# Patient Record
Sex: Male | Born: 1975
Health system: Southern US, Community
[De-identification: ages and names within clinical notes are randomized; demographics above are authoritative.]

## PROBLEM LIST (undated history)

## (undated) DIAGNOSIS — I10 Essential (primary) hypertension: Secondary | ICD-10-CM

## (undated) DIAGNOSIS — R0600 Dyspnea, unspecified: Secondary | ICD-10-CM

## (undated) DIAGNOSIS — J45909 Unspecified asthma, uncomplicated: Secondary | ICD-10-CM

## (undated) DIAGNOSIS — R748 Abnormal levels of other serum enzymes: Secondary | ICD-10-CM

## (undated) DIAGNOSIS — Z87442 Personal history of urinary calculi: Secondary | ICD-10-CM

## (undated) DIAGNOSIS — G4730 Sleep apnea, unspecified: Secondary | ICD-10-CM

## (undated) DIAGNOSIS — J342 Deviated nasal septum: Secondary | ICD-10-CM

## (undated) DIAGNOSIS — I639 Cerebral infarction, unspecified: Secondary | ICD-10-CM

## (undated) HISTORY — PX: VASECTOMY: SHX75

## (undated) SURGERY — ECHOCARDIOGRAM, TRANSESOPHAGEAL
Anesthesia: Moderate Sedation

---

## 2017-01-23 ENCOUNTER — Inpatient Hospital Stay
Admission: RE | Admit: 2017-01-23 | Discharge: 2017-01-23 | Disposition: A | Discharge status: Home / Self Care | Payer: Self-pay | Source: Ambulatory Visit

## 2017-01-24 ENCOUNTER — Inpatient Hospital Stay: Admission: RE | Admit: 2017-01-24 | Payer: Self-pay | Source: Ambulatory Visit

## 2017-02-26 ENCOUNTER — Other Ambulatory Visit: Payer: BLUE CROSS/BLUE SHIELD

## 2017-02-26 ENCOUNTER — Encounter
Admission: RE | Admit: 2017-02-26 | Discharge: 2017-02-26 | Disposition: A | Discharge status: Home / Self Care | Payer: BLUE CROSS/BLUE SHIELD | Source: Ambulatory Visit | Attending: Unknown Physician Specialty | Admitting: Unknown Physician Specialty

## 2017-02-26 HISTORY — DX: Unspecified asthma, uncomplicated: J45.909

## 2017-02-26 HISTORY — DX: Dyspnea, unspecified: R06.00

## 2017-02-26 HISTORY — DX: Sleep apnea, unspecified: G47.30

## 2017-02-26 HISTORY — DX: Essential (primary) hypertension: I10

## 2017-02-26 HISTORY — DX: Deviated nasal septum: J34.2

## 2017-02-26 NOTE — Patient Instructions (Signed)
  Your procedure is scheduled on: 03/06/17 Report to Day Surgery. [facility] To find out your arrival time please call [phone] between 1PM - 3PM on 03/05/17  Remember: Instructions that are not followed completely may result in serious medical risk, up to and including death, or upon the discretion of your surgeon and anesthesiologist your surgery may need to be rescheduled.    _X___ 1. Do not eat food or drink liquids after midnight. No gum chewing or hard candies.     _C___ 2. No Alcohol for 24 hours before or after surgery.   ____ 3. Do Not Smoke For 24 Hours Prior to Your Surgery.   ____ 4. Bring all medications with you on the day of surgery if instructed.    __X__ 5. Notify your doctor if there is any change in your medical condition     (cold, fever, infections).       Do not wear jewelry, make-up, hairpins, clips or nail polish.  Do not wear lotions, powders, or perfumes. You may wear deodorant.  Do not shave 48 hours prior to surgery. Men may shave face and neck.  Do not bring valuables to the hospital.    [facility] or [facility]uables.               Contacts, dentures or bridgework may not be worn into surgery.  Leave your suitcase in the car. After surgery it may be brought to your room.  For patients admitted to the hospital, discharge time is determined by your                treatment team.   Patients discharged the day of surgery will not be allowed to drive home.     ____ Take these medicines the morning of surgery with A SIP OF WATER:    1. NONE  2.   3.   4.  5.  6.  ____ Fleet Enema (as directed)   ____ Use CHG Soap as directed  ____ Use inhalers on the day of surgery  ____ Stop metformin 2 days prior to surgery    ____ Take 1/2 of usual insulin dose the night before surgery and none on the morning of surgery.   ____ Stop Coumadin/Plavix/aspirin on   ____ Stop Anti-inflammatories on    ____  Stop supplements until after surgery.    _X___ Bring C-Pap to the hospital.

## 2017-03-02 ENCOUNTER — Encounter
Admission: RE | Admit: 2017-03-02 | Discharge: 2017-03-02 | Disposition: A | Discharge status: Home / Self Care | Payer: BLUE CROSS/BLUE SHIELD | Source: Ambulatory Visit | Attending: Unknown Physician Specialty | Admitting: Unknown Physician Specialty

## 2017-03-02 ENCOUNTER — Other Ambulatory Visit: Payer: Self-pay

## 2017-03-02 DIAGNOSIS — Z01810 Encounter for preprocedural cardiovascular examination: Secondary | ICD-10-CM | POA: Diagnosis present

## 2017-03-02 DIAGNOSIS — I10 Essential (primary) hypertension: Secondary | ICD-10-CM | POA: Insufficient documentation

## 2017-03-05 MED ORDER — PHENYLEPHRINE HCL 10 % OP SOLN
Freq: Once | OPHTHALMIC | Status: DC
  Filled 2017-03-05: qty 10

## 2017-03-06 ENCOUNTER — Encounter: Payer: Self-pay | Admitting: *Deleted

## 2017-03-06 ENCOUNTER — Ambulatory Visit: Payer: BLUE CROSS/BLUE SHIELD | Admitting: Registered Nurse

## 2017-03-06 ENCOUNTER — Encounter
Admission: RE | Disposition: A | Discharge status: Home / Self Care | Payer: Self-pay | Source: Ambulatory Visit | Attending: Unknown Physician Specialty

## 2017-03-06 ENCOUNTER — Ambulatory Visit
Admission: RE | Admit: 2017-03-06 | Discharge: 2017-03-06 | Disposition: A | Discharge status: Home / Self Care | Payer: BLUE CROSS/BLUE SHIELD | Source: Ambulatory Visit | Attending: Unknown Physician Specialty | Admitting: Unknown Physician Specialty

## 2017-03-06 DIAGNOSIS — I10 Essential (primary) hypertension: Secondary | ICD-10-CM | POA: Diagnosis not present

## 2017-03-06 DIAGNOSIS — J342 Deviated nasal septum: Secondary | ICD-10-CM | POA: Insufficient documentation

## 2017-03-06 DIAGNOSIS — J343 Hypertrophy of nasal turbinates: Secondary | ICD-10-CM | POA: Diagnosis not present

## 2017-03-06 DIAGNOSIS — J45901 Unspecified asthma with (acute) exacerbation: Secondary | ICD-10-CM | POA: Diagnosis not present

## 2017-03-06 DIAGNOSIS — G4730 Sleep apnea, unspecified: Secondary | ICD-10-CM | POA: Insufficient documentation

## 2017-03-06 DIAGNOSIS — J3489 Other specified disorders of nose and nasal sinuses: Secondary | ICD-10-CM | POA: Diagnosis present

## 2017-03-06 HISTORY — PX: NASAL SEPTOPLASTY W/ TURBINOPLASTY: SHX2070

## 2017-03-06 SURGERY — SEPTOPLASTY, NOSE, WITH NASAL TURBINATE REDUCTION
Anesthesia: General | Laterality: Bilateral | Wound class: Clean Contaminated

## 2017-03-06 MED ORDER — FAMOTIDINE 20 MG PO TABS
20.0000 mg | ORAL_TABLET | Freq: Once | ORAL | Status: AC
  Administered 2017-03-06: 20 mg via ORAL

## 2017-03-06 MED ORDER — LACTATED RINGERS IV SOLN
INTRAVENOUS | Status: DC
  Administered 2017-03-06: 08:00:00 via INTRAVENOUS

## 2017-03-06 MED ORDER — SULFAMETHOXAZOLE-TRIMETHOPRIM 800-160 MG PO TABS: 1.0000 | ORAL_TABLET | Freq: Two times a day (BID) | ORAL | 0 refills | Status: DC

## 2017-03-06 MED ORDER — FENTANYL CITRATE (PF) 100 MCG/2ML IJ SOLN
INTRAMUSCULAR | Status: AC
  Filled 2017-03-06: qty 2

## 2017-03-06 MED ORDER — ONDANSETRON HCL 4 MG/2ML IJ SOLN
4.0000 mg | Freq: Once | INTRAMUSCULAR | Status: DC | PRN
Start: 2017-03-06 — End: 2017-03-06

## 2017-03-06 MED ORDER — LIDOCAINE-EPINEPHRINE 1 %-1:100000 IJ SOLN
INTRAMUSCULAR | Status: DC | PRN
  Administered 2017-03-06: 13 mL

## 2017-03-06 MED ORDER — FAMOTIDINE 20 MG PO TABS
ORAL_TABLET | ORAL | Status: AC
  Filled 2017-03-06: qty 1

## 2017-03-06 MED ORDER — BACITRACIN ZINC 500 UNIT/GM EX OINT
TOPICAL_OINTMENT | CUTANEOUS | Status: AC
  Filled 2017-03-06: qty 28.35

## 2017-03-06 MED ORDER — OXYMETAZOLINE HCL 0.05 % NA SOLN
NASAL | Status: AC
  Filled 2017-03-06: qty 15

## 2017-03-06 MED ORDER — LIDOCAINE-EPINEPHRINE 1 %-1:100000 IJ SOLN
INTRAMUSCULAR | Status: AC
  Filled 2017-03-06: qty 1

## 2017-03-06 MED ORDER — ROCURONIUM BROMIDE 50 MG/5ML IV SOLN
INTRAVENOUS | Status: AC
  Filled 2017-03-06: qty 1

## 2017-03-06 MED ORDER — HYDRALAZINE HCL 20 MG/ML IJ SOLN
10.0000 mg | Freq: Once | INTRAMUSCULAR | Status: AC
  Administered 2017-03-06: 10 mg via INTRAVENOUS

## 2017-03-06 MED ORDER — SUGAMMADEX SODIUM 200 MG/2ML IV SOLN
INTRAVENOUS | Status: AC
  Filled 2017-03-06: qty 2

## 2017-03-06 MED ORDER — FENTANYL CITRATE (PF) 100 MCG/2ML IJ SOLN
25.0000 ug | INTRAMUSCULAR | Status: DC | PRN
  Administered 2017-03-06 (×3): 25 ug via INTRAVENOUS

## 2017-03-06 MED ORDER — LIDOCAINE HCL (PF) 4 % IJ SOLN
INTRAMUSCULAR | Status: AC
  Filled 2017-03-06: qty 10

## 2017-03-06 MED ORDER — ROCURONIUM BROMIDE 100 MG/10ML IV SOLN
INTRAVENOUS | Status: DC | PRN
  Administered 2017-03-06: 10 mg via INTRAVENOUS
  Administered 2017-03-06: 5 mg via INTRAVENOUS

## 2017-03-06 MED ORDER — MIDAZOLAM HCL 2 MG/2ML IJ SOLN
INTRAMUSCULAR | Status: DC | PRN
  Administered 2017-03-06: 2 mg via INTRAVENOUS

## 2017-03-06 MED ORDER — ONDANSETRON HCL 4 MG/2ML IJ SOLN
INTRAMUSCULAR | Status: AC
  Filled 2017-03-06: qty 2

## 2017-03-06 MED ORDER — LIDOCAINE HCL (PF) 2 % IJ SOLN
INTRAMUSCULAR | Status: AC
  Filled 2017-03-06: qty 2

## 2017-03-06 MED ORDER — ONDANSETRON HCL 4 MG/2ML IJ SOLN
INTRAMUSCULAR | Status: DC | PRN
  Administered 2017-03-06: 4 mg via INTRAVENOUS

## 2017-03-06 MED ORDER — LABETALOL HCL 5 MG/ML IV SOLN
INTRAVENOUS | Status: AC
  Filled 2017-03-06: qty 4

## 2017-03-06 MED ORDER — DEXAMETHASONE SODIUM PHOSPHATE 10 MG/ML IJ SOLN
INTRAMUSCULAR | Status: DC | PRN
  Administered 2017-03-06: 10 mg via INTRAVENOUS

## 2017-03-06 MED ORDER — MIDAZOLAM HCL 2 MG/2ML IJ SOLN
INTRAMUSCULAR | Status: AC
  Filled 2017-03-06: qty 2

## 2017-03-06 MED ORDER — HYDRALAZINE HCL 20 MG/ML IJ SOLN
INTRAMUSCULAR | Status: AC
  Administered 2017-03-06: 10 mg
  Filled 2017-03-06: qty 1

## 2017-03-06 MED ORDER — DEXAMETHASONE SODIUM PHOSPHATE 10 MG/ML IJ SOLN
INTRAMUSCULAR | Status: AC
  Filled 2017-03-06: qty 1

## 2017-03-06 MED ORDER — PHENYLEPHRINE HCL 10 MG/ML IJ SOLN
INTRAMUSCULAR | Status: AC
  Filled 2017-03-06: qty 1

## 2017-03-06 MED ORDER — BACITRACIN ZINC 500 UNIT/GM EX OINT
TOPICAL_OINTMENT | CUTANEOUS | Status: DC | PRN
  Administered 2017-03-06: 1 via TOPICAL

## 2017-03-06 MED ORDER — LABETALOL HCL 5 MG/ML IV SOLN
10.0000 mg | INTRAVENOUS | Status: DC | PRN
  Administered 2017-03-06: 10 mg via INTRAVENOUS

## 2017-03-06 MED ORDER — PROPOFOL 10 MG/ML IV BOLUS
INTRAVENOUS | Status: DC | PRN
  Administered 2017-03-06: 180 mg via INTRAVENOUS

## 2017-03-06 MED ORDER — PHENYLEPHRINE HCL 10 % OP SOLN
OPHTHALMIC | Status: DC | PRN
  Administered 2017-03-06: 10 mL via NASAL

## 2017-03-06 MED ORDER — OXYMETAZOLINE HCL 0.05 % NA SOLN
6.0000 | Freq: Once | NASAL | Status: AC
  Administered 2017-03-06: 6 via NASAL

## 2017-03-06 MED ORDER — SUCCINYLCHOLINE CHLORIDE 20 MG/ML IJ SOLN
INTRAMUSCULAR | Status: AC
  Filled 2017-03-06: qty 1

## 2017-03-06 MED ORDER — FENTANYL CITRATE (PF) 100 MCG/2ML IJ SOLN
INTRAMUSCULAR | Status: DC | PRN
  Administered 2017-03-06 (×2): 50 ug via INTRAVENOUS

## 2017-03-06 MED ORDER — SUGAMMADEX SODIUM 200 MG/2ML IV SOLN
INTRAVENOUS | Status: DC | PRN
  Administered 2017-03-06: 160 mg via INTRAVENOUS

## 2017-03-06 MED ORDER — OXYCODONE-ACETAMINOPHEN 5-325 MG PO TABS: 1.0000 | ORAL_TABLET | ORAL | 0 refills | Status: DC | PRN

## 2017-03-06 MED ORDER — SUCCINYLCHOLINE CHLORIDE 20 MG/ML IJ SOLN
INTRAMUSCULAR | Status: DC | PRN
Start: 2017-03-06 — End: 2017-03-06
  Administered 2017-03-06: 100 mg via INTRAVENOUS

## 2017-03-06 MED ORDER — LIDOCAINE HCL (CARDIAC) 20 MG/ML IV SOLN
INTRAVENOUS | Status: DC | PRN
  Administered 2017-03-06: 80 mg via INTRAVENOUS

## 2017-03-06 MED ORDER — PROPOFOL 10 MG/ML IV BOLUS
INTRAVENOUS | Status: AC
  Filled 2017-03-06: qty 20

## 2017-03-06 SURGICAL SUPPLY — 25 items
BANDAGE EYE OVAL (MISCELLANEOUS) ×3 IMPLANT
BLADE SURG 15 STRL LF DISP TIS (BLADE) ×1 IMPLANT
BLADE SURG 15 STRL SS (BLADE) ×2
CANISTER SUCT 1200ML W/VALVE (MISCELLANEOUS) ×3 IMPLANT
COAG SUCT 10F 3.5MM HAND CTRL (MISCELLANEOUS) ×3 IMPLANT
DRESSING NASL FOAM PST OP SINU (MISCELLANEOUS) ×2 IMPLANT
DRSG NASAL FOAM POST OP SINU (MISCELLANEOUS) ×6
ELECT REM PT RETURN 9FT ADLT (ELECTROSURGICAL) ×3
ELECTRODE REM PT RTRN 9FT ADLT (ELECTROSURGICAL) ×1 IMPLANT
GLOVE BIO SURGEON STRL SZ7.5 (GLOVE) ×6 IMPLANT
GOWN STRL REUS W/ TWL LRG LVL3 (GOWN DISPOSABLE) ×2 IMPLANT
GOWN STRL REUS W/TWL LRG LVL3 (GOWN DISPOSABLE) ×4
LABEL OR SOLS (LABEL) ×3 IMPLANT
NS IRRIG 500ML POUR BTL (IV SOLUTION) ×3 IMPLANT
PACK HEAD/NECK (MISCELLANEOUS) ×3 IMPLANT
SPLINT NASAL REUTER .5MM (MISCELLANEOUS) ×3 IMPLANT
SPOGE SURGIFLO 8M (HEMOSTASIS) ×2
SPONGE NEURO XRAY DETECT 1X3 (DISPOSABLE) ×3 IMPLANT
SPONGE SURGIFLO 8M (HEMOSTASIS) ×1 IMPLANT
SUT CHROMIC 3-0 (SUTURE) ×2
SUT CHROMIC 3-0 KS 27XMFL CR (SUTURE) ×1
SUT ETHILON 3-0 KS 30 BLK (SUTURE) ×3 IMPLANT
SUT PLAIN GUT 4-0 (SUTURE) ×3 IMPLANT
SUTURE CHRMC 3-0 KS 27XMFL CR (SUTURE) ×1 IMPLANT
WATER STERILE IRR 1000ML POUR (IV SOLUTION) ×3 IMPLANT

## 2017-03-06 NOTE — Op Note (Signed)
PREOPERATIVE DIAGNOSIS:  Chronic nasal obstruction.  POSTOPERATIVE DIAGNOSIS:  Chronic nasal obstruction.  SURGEON:  Davina Poke, M.D.  NAME OF PROCEDURE:  1. Nasal septoplasty. 2. Submucous resection of inferior turbinates.  OPERATIVE FINDINGS:  Severe nasal septal deformity, hypertrophy of the inferior turbinates.   DESCRIPTION OF THE PROCEDURE:  Jeremy [name] was identified in the holding area and taken to the operating room and placed in the supine position.  After general endotracheal anesthesia was induced, the table was turned 45 degrees and the patient was placed in a semi-Fowler position.  The nose was then topically anesthetized with Lidocaine, cotton pledgets were placed within each nostril. After approximately 5 minutes, this was removed at which time a local anesthetic of 1% Lidocaine 1:100,000 units of Epinephrine was used to inject the inferior turbinates in the nasal septum. A total of 69ml ml was used. Examination of the nose showed a severe left nasal septal deformity and tremendous hypertrophied inferior turbinate.  Beginning on the right hand side a hemitransfixion incision was then created on the leading edge of the septum on the right.  A subperichondrial plane was elevated posteriorly on the left and taken back to the perpendicular plate of the ethmoid where subperiosteal plane was elevated posteriorly on the left. A large septal spur was identified on the left hand side impacting on the inferior turbinate.  An inferior rim of cartilage was removed anteriorly with care taken to leave an anterior strut to prevent nasal collapse. With this strut removed the perpendicular plate of the ethmoid was separated from the quadrangular cartilage. The large septal spur was removed.  The septum was then replaced in the midline. Reinspection through each nostril showed excellent reduction of the septal deformity. A left posterior inferior fenestration was then created to allow hematoma  drainage.  With the septoplasty completed, beginning on the left-hand side, a 15 blade was used to incise along the inferior edge of the inferior turbinate. A superior laterally based flap was then elevated. The underlying conchal bone of mucosa was excised using Knight scissors. The flap was then laid back over the turbinate stump and cauterized using suction cautery. In a similar fashion the submucous resection was performed on the right.  With the submucous resection completed bilaterally and no active bleeding, the hemitransfixion incision was then closed using two interrupted 3-0 chromic sutures.  Plastic nasal septal splints were placed within each nostril and affixed to the septum using a 3-0 nylon suture. Stammberger was then used beneath each inferior turbinate for hemostasis.    The patient tolerated the procedure well, was returned to anesthesia, extubated in the operating room, and taken to the recovery room in stable condition.    CULTURES:  None.  SPECIMENS:  None.  ESTIMATED BLOOD LOSS:  25 cc.  Marliyah Reid T  03/06/2017  8:52 AM

## 2017-03-06 NOTE — Progress Notes (Signed)
Dr. Noralyn Pick phoned in reference to blood pressure being Elevated, ordered labetalol.

## 2017-03-06 NOTE — Progress Notes (Signed)
Patient having some coughing, denies being a smoker And states he does have some sinus drainage.  Humidified mask placed over patients face.

## 2017-03-06 NOTE — Progress Notes (Signed)
Phoned Dr. Noralyn Pick to advise bp , give another dose Hydralazine .

## 2017-03-06 NOTE — Progress Notes (Signed)
Phoned Dr. Noralyn Pick in reference to elevated bp still, Hydralazine ordered.

## 2017-03-06 NOTE — H&P (Signed)
The patient's history has been reviewed, patient examined, no change in status, stable for surgery.  Questions were answered to the patients satisfaction.  

## 2017-03-06 NOTE — Anesthesia Postprocedure Evaluation (Signed)
Anesthesia Post Note  Patient: Jeremy [name]  Procedure(s) Performed: Procedure(s) (LRB): NASAL SEPTOPLASTY WITH TURBINATE REDUCTION (Bilateral)  Patient location during evaluation: PACU Anesthesia Type: General Level of consciousness: awake and alert and oriented Pain management: pain level controlled Vital Signs Assessment: post-procedure vital signs reviewed and stable Respiratory status: spontaneous breathing Cardiovascular status: blood pressure returned to baseline Anesthetic complications: no     Last Vitals:  Vitals:   03/06/17 1018 03/06/17 1044  BP: (!) 150/97 (!) 141/92  Pulse: 94 89  Resp: 16 16  Temp: 36.8 C   SpO2: 100% 100%    Last Pain:  Vitals:   03/06/17 1044  TempSrc:   PainSc: 3                  Hutch Rhett

## 2017-03-06 NOTE — Transfer of Care (Signed)
Immediate Anesthesia Transfer of Care Note  Patient: Jeremy [name]  Procedure(s) Performed: Procedure(s): NASAL SEPTOPLASTY WITH TURBINATE REDUCTION (Bilateral)  Patient Location: PACU  Anesthesia Type:General  Level of Consciousness: awake, alert  and oriented  Airway & Oxygen Therapy: Patient Spontanous Breathing and Patient connected to face mask oxygen  Post-op Assessment: Report given to RN and Post -op Vital signs reviewed and stable  Post vital signs: Reviewed and stable  Last Vitals:  Vitals:   03/06/17 0720 03/06/17 0901  BP: (!) 147/101 (!) 152/110  Pulse: 90 92  Resp: 16 10  Temp: 36.5 C (!) 36.2 C  SpO2: 100% 100%    Last Pain:  Vitals:   03/06/17 0901  TempSrc: Temporal         Complications: No apparent anesthesia complications

## 2017-03-06 NOTE — Anesthesia Preprocedure Evaluation (Signed)
Anesthesia Evaluation  Patient identified by MRN, date of birth, ID band Patient awake    Reviewed: Allergy & Precautions, NPO status , Patient's Chart, lab work & pertinent test results  Airway Mallampati: II  TM Distance: <3 FB     Dental   Pulmonary shortness of breath and with exertion, asthma , sleep apnea ,    Pulmonary exam normal        Cardiovascular hypertension, Normal cardiovascular exam     Neuro/Psych    GI/Hepatic negative GI ROS, Neg liver ROS,   Endo/Other  negative endocrine ROS  Renal/GU negative Renal ROS  negative genitourinary   Musculoskeletal negative musculoskeletal ROS (+)   Abdominal Normal abdominal exam  (+)   Peds negative pediatric ROS (+)  Hematology negative hematology ROS (+)   Anesthesia Other Findings   Reproductive/Obstetrics                             Anesthesia Physical Anesthesia Plan  ASA: II  Anesthesia Plan: General   Post-op Pain Management:    Induction: Intravenous  PONV Risk Score and Plan:   Airway Management Planned: Oral ETT  Additional Equipment:   Intra-op Plan:   Post-operative Plan: Extubation in OR  Informed Consent: I have reviewed the patients History and Physical, chart, labs and discussed the procedure including the risks, benefits and alternatives for the proposed anesthesia with the patient or authorized representative who has indicated his/her understanding and acceptance.   Dental advisory given  Plan Discussed with: CRNA and Surgeon  Anesthesia Plan Comments:         Anesthesia Quick Evaluation

## 2017-03-06 NOTE — Discharge Instructions (Signed)

## 2017-03-06 NOTE — Progress Notes (Signed)
Dr. Noralyn Pick aware of improved bp, 147/89.  May proceed With patient onto post op area.

## 2017-03-06 NOTE — Anesthesia Post-op Follow-up Note (Signed)
Anesthesia QCDR form completed.        

## 2017-03-06 NOTE — Anesthesia Procedure Notes (Signed)
Procedure Name: Intubation Date/Time: 03/06/2017 8:07 AM Performed by: Hedda Slade Pre-anesthesia Checklist: Patient identified, Patient being monitored, Timeout performed, Emergency Drugs available and Suction available Patient Re-evaluated:Patient Re-evaluated prior to induction Oxygen Delivery Method: Circle system utilized Preoxygenation: Pre-oxygenation with 100% oxygen Induction Type: IV induction Ventilation: Mask ventilation without difficulty Laryngoscope Size: Mac and 4 Grade View: Grade I Tube type: Oral Rae Tube size: 7.5 mm Number of attempts: 1 Airway Equipment and Method: Stylet Placement Confirmation: ETT inserted through vocal cords under direct vision,  positive ETCO2 and breath sounds checked- equal and bilateral Secured at: 22 cm Tube secured with: Tape Dental Injury: Teeth and Oropharynx as per pre-operative assessment

## 2017-03-06 NOTE — OR Nursing (Signed)
Scant bloody drng from nose; nasal drip pad, gauze applied 1030 am

## 2017-03-07 ENCOUNTER — Encounter: Payer: Self-pay | Admitting: Unknown Physician Specialty

## 2017-07-17 DIAGNOSIS — I639 Cerebral infarction, unspecified: Secondary | ICD-10-CM

## 2017-07-17 HISTORY — DX: Cerebral infarction, unspecified: I63.9

## 2017-07-27 ENCOUNTER — Encounter (HOSPITAL_COMMUNITY)
Admission: EM | Disposition: A | Discharge status: Rehabilitation Facility | Payer: Self-pay | Source: Home / Self Care | Attending: Neurology

## 2017-07-27 ENCOUNTER — Inpatient Hospital Stay (HOSPITAL_COMMUNITY): Payer: BLUE CROSS/BLUE SHIELD | Admitting: Anesthesiology

## 2017-07-27 ENCOUNTER — Emergency Department (HOSPITAL_COMMUNITY): Payer: BLUE CROSS/BLUE SHIELD

## 2017-07-27 ENCOUNTER — Inpatient Hospital Stay (HOSPITAL_COMMUNITY): Payer: BLUE CROSS/BLUE SHIELD

## 2017-07-27 ENCOUNTER — Encounter (HOSPITAL_COMMUNITY): Payer: Self-pay | Admitting: Certified Registered"

## 2017-07-27 ENCOUNTER — Other Ambulatory Visit: Payer: Self-pay

## 2017-07-27 ENCOUNTER — Inpatient Hospital Stay (HOSPITAL_COMMUNITY)
Admission: EM | Admit: 2017-07-27 | Discharge: 2017-08-07 | DRG: 023 | Disposition: A | Discharge status: Rehabilitation Facility | Payer: BLUE CROSS/BLUE SHIELD | Attending: Neurology | Admitting: Neurology

## 2017-07-27 DIAGNOSIS — I609 Nontraumatic subarachnoid hemorrhage, unspecified: Secondary | ICD-10-CM | POA: Diagnosis not present

## 2017-07-27 DIAGNOSIS — D649 Anemia, unspecified: Secondary | ICD-10-CM | POA: Diagnosis not present

## 2017-07-27 DIAGNOSIS — I69351 Hemiplegia and hemiparesis following cerebral infarction affecting right dominant side: Secondary | ICD-10-CM | POA: Diagnosis not present

## 2017-07-27 DIAGNOSIS — J9600 Acute respiratory failure, unspecified whether with hypoxia or hypercapnia: Secondary | ICD-10-CM | POA: Diagnosis not present

## 2017-07-27 DIAGNOSIS — J9690 Respiratory failure, unspecified, unspecified whether with hypoxia or hypercapnia: Secondary | ICD-10-CM

## 2017-07-27 DIAGNOSIS — R414 Neurologic neglect syndrome: Secondary | ICD-10-CM | POA: Diagnosis present

## 2017-07-27 DIAGNOSIS — I63412 Cerebral infarction due to embolism of left middle cerebral artery: Secondary | ICD-10-CM | POA: Diagnosis present

## 2017-07-27 DIAGNOSIS — R29810 Facial weakness: Secondary | ICD-10-CM | POA: Diagnosis present

## 2017-07-27 DIAGNOSIS — G936 Cerebral edema: Secondary | ICD-10-CM

## 2017-07-27 DIAGNOSIS — E876 Hypokalemia: Secondary | ICD-10-CM | POA: Diagnosis not present

## 2017-07-27 DIAGNOSIS — Z888 Allergy status to other drugs, medicaments and biological substances status: Secondary | ICD-10-CM

## 2017-07-27 DIAGNOSIS — I10 Essential (primary) hypertension: Secondary | ICD-10-CM | POA: Diagnosis present

## 2017-07-27 DIAGNOSIS — R29720 NIHSS score 20: Secondary | ICD-10-CM | POA: Diagnosis present

## 2017-07-27 DIAGNOSIS — E878 Other disorders of electrolyte and fluid balance, not elsewhere classified: Secondary | ICD-10-CM | POA: Diagnosis present

## 2017-07-27 DIAGNOSIS — T82538A Leakage of other cardiac and vascular devices and implants, initial encounter: Secondary | ICD-10-CM

## 2017-07-27 DIAGNOSIS — G4733 Obstructive sleep apnea (adult) (pediatric): Secondary | ICD-10-CM | POA: Diagnosis present

## 2017-07-27 DIAGNOSIS — R739 Hyperglycemia, unspecified: Secondary | ICD-10-CM | POA: Diagnosis not present

## 2017-07-27 DIAGNOSIS — F329 Major depressive disorder, single episode, unspecified: Secondary | ICD-10-CM | POA: Diagnosis present

## 2017-07-27 DIAGNOSIS — Z9889 Other specified postprocedural states: Secondary | ICD-10-CM | POA: Diagnosis not present

## 2017-07-27 DIAGNOSIS — R402254 Coma scale, best verbal response, oriented, 24 hours or more after hospital admission: Secondary | ICD-10-CM | POA: Diagnosis not present

## 2017-07-27 DIAGNOSIS — R1310 Dysphagia, unspecified: Secondary | ICD-10-CM | POA: Diagnosis present

## 2017-07-27 DIAGNOSIS — D72829 Elevated white blood cell count, unspecified: Secondary | ICD-10-CM | POA: Diagnosis not present

## 2017-07-27 DIAGNOSIS — E785 Hyperlipidemia, unspecified: Secondary | ICD-10-CM

## 2017-07-27 DIAGNOSIS — N179 Acute kidney failure, unspecified: Secondary | ICD-10-CM | POA: Diagnosis present

## 2017-07-27 DIAGNOSIS — R402144 Coma scale, eyes open, spontaneous, 24 hours or more after hospital admission: Secondary | ICD-10-CM | POA: Diagnosis not present

## 2017-07-27 DIAGNOSIS — F419 Anxiety disorder, unspecified: Secondary | ICD-10-CM | POA: Diagnosis present

## 2017-07-27 DIAGNOSIS — I63512 Cerebral infarction due to unspecified occlusion or stenosis of left middle cerebral artery: Secondary | ICD-10-CM

## 2017-07-27 DIAGNOSIS — R402354 Coma scale, best motor response, localizes pain, 24 hours or more after hospital admission: Secondary | ICD-10-CM | POA: Diagnosis not present

## 2017-07-27 DIAGNOSIS — I639 Cerebral infarction, unspecified: Secondary | ICD-10-CM | POA: Diagnosis not present

## 2017-07-27 DIAGNOSIS — R509 Fever, unspecified: Secondary | ICD-10-CM

## 2017-07-27 DIAGNOSIS — I63032 Cerebral infarction due to thrombosis of left carotid artery: Secondary | ICD-10-CM | POA: Diagnosis not present

## 2017-07-27 DIAGNOSIS — I6350 Cerebral infarction due to unspecified occlusion or stenosis of unspecified cerebral artery: Secondary | ICD-10-CM | POA: Diagnosis not present

## 2017-07-27 DIAGNOSIS — A499 Bacterial infection, unspecified: Secondary | ICD-10-CM | POA: Diagnosis not present

## 2017-07-27 DIAGNOSIS — E781 Pure hyperglyceridemia: Secondary | ICD-10-CM | POA: Diagnosis not present

## 2017-07-27 DIAGNOSIS — G441 Vascular headache, not elsewhere classified: Secondary | ICD-10-CM | POA: Diagnosis not present

## 2017-07-27 DIAGNOSIS — D62 Acute posthemorrhagic anemia: Secondary | ICD-10-CM | POA: Diagnosis not present

## 2017-07-27 DIAGNOSIS — N390 Urinary tract infection, site not specified: Secondary | ICD-10-CM | POA: Diagnosis not present

## 2017-07-27 DIAGNOSIS — J45909 Unspecified asthma, uncomplicated: Secondary | ICD-10-CM | POA: Diagnosis present

## 2017-07-27 DIAGNOSIS — Z9911 Dependence on respirator [ventilator] status: Secondary | ICD-10-CM

## 2017-07-27 DIAGNOSIS — R4701 Aphasia: Secondary | ICD-10-CM | POA: Diagnosis present

## 2017-07-27 DIAGNOSIS — I69919 Unspecified symptoms and signs involving cognitive functions following unspecified cerebrovascular disease: Secondary | ICD-10-CM | POA: Diagnosis not present

## 2017-07-27 DIAGNOSIS — E782 Mixed hyperlipidemia: Secondary | ICD-10-CM | POA: Diagnosis not present

## 2017-07-27 DIAGNOSIS — E538 Deficiency of other specified B group vitamins: Secondary | ICD-10-CM | POA: Diagnosis not present

## 2017-07-27 DIAGNOSIS — G932 Benign intracranial hypertension: Secondary | ICD-10-CM | POA: Diagnosis present

## 2017-07-27 DIAGNOSIS — J9601 Acute respiratory failure with hypoxia: Secondary | ICD-10-CM | POA: Diagnosis not present

## 2017-07-27 DIAGNOSIS — Z978 Presence of other specified devices: Secondary | ICD-10-CM

## 2017-07-27 DIAGNOSIS — F4323 Adjustment disorder with mixed anxiety and depressed mood: Secondary | ICD-10-CM | POA: Diagnosis not present

## 2017-07-27 DIAGNOSIS — Z79899 Other long term (current) drug therapy: Secondary | ICD-10-CM

## 2017-07-27 DIAGNOSIS — I69314 Frontal lobe and executive function deficit following cerebral infarction: Secondary | ICD-10-CM | POA: Diagnosis not present

## 2017-07-27 DIAGNOSIS — I69320 Aphasia following cerebral infarction: Secondary | ICD-10-CM | POA: Diagnosis not present

## 2017-07-27 HISTORY — PX: RADIOLOGY WITH ANESTHESIA: SHX6223

## 2017-07-27 LAB — CBC
HEMATOCRIT: 45.5 % (ref 39.0–52.0)
HEMOGLOBIN: 16.1 g/dL (ref 13.0–17.0)
MCH: 31.1 pg (ref 26.0–34.0)
MCHC: 35.4 g/dL (ref 30.0–36.0)
MCV: 88 fL (ref 78.0–100.0)
Platelets: 228 10*3/uL (ref 150–400)
RBC: 5.17 MIL/uL (ref 4.22–5.81)
RDW: 12.3 % (ref 11.5–15.5)
WBC: 6.6 10*3/uL (ref 4.0–10.5)

## 2017-07-27 LAB — I-STAT TROPONIN, ED: TROPONIN I, POC: 0 ng/mL (ref 0.00–0.08)

## 2017-07-27 LAB — COMPREHENSIVE METABOLIC PANEL
ALT: 98 U/L — ABNORMAL HIGH (ref 17–63)
ANION GAP: 11 (ref 5–15)
AST: 48 U/L — ABNORMAL HIGH (ref 15–41)
Albumin: 4.2 g/dL (ref 3.5–5.0)
Alkaline Phosphatase: 40 U/L (ref 38–126)
BILIRUBIN TOTAL: 1.1 mg/dL (ref 0.3–1.2)
BUN: 22 mg/dL — AB (ref 6–20)
CHLORIDE: 99 mmol/L — AB (ref 101–111)
CO2: 26 mmol/L (ref 22–32)
Calcium: 9.2 mg/dL (ref 8.9–10.3)
Creatinine, Ser: 1.29 mg/dL — ABNORMAL HIGH (ref 0.61–1.24)
GFR calc non Af Amer: >60 mL/min (ref >60)
Glucose, Bld: 129 mg/dL — ABNORMAL HIGH (ref 65–99)
Potassium: 4.1 mmol/L (ref 3.5–5.1)
Sodium: 136 mmol/L (ref 135–145)
TOTAL PROTEIN: 7.8 g/dL (ref 6.5–8.1)

## 2017-07-27 LAB — DIFFERENTIAL
BASOS ABS: 0 10*3/uL (ref 0.0–0.1)
BASOS PCT: 0 %
EOS PCT: 1 %
Eosinophils Absolute: 0.1 10*3/uL (ref 0.0–0.7)
LYMPHS ABS: 2.7 10*3/uL (ref 0.7–4.0)
Lymphocytes Relative: 41 %
MONO ABS: 0.6 10*3/uL (ref 0.1–1.0)
MONOS PCT: 9 %
Neutro Abs: 3.2 10*3/uL (ref 1.7–7.7)
Neutrophils Relative %: 49 %

## 2017-07-27 LAB — I-STAT CHEM 8, ED
BUN: 25 mg/dL — ABNORMAL HIGH (ref 6–20)
CREATININE: 1.3 mg/dL — AB (ref 0.61–1.24)
Calcium, Ion: 1.09 mmol/L — ABNORMAL LOW (ref 1.15–1.40)
Chloride: 99 mmol/L — ABNORMAL LOW (ref 101–111)
Glucose, Bld: 125 mg/dL — ABNORMAL HIGH (ref 65–99)
HCT: 48 % (ref 39.0–52.0)
HEMOGLOBIN: 16.3 g/dL (ref 13.0–17.0)
Potassium: 4.1 mmol/L (ref 3.5–5.1)
Sodium: 138 mmol/L (ref 135–145)
TCO2: 25 mmol/L (ref 22–32)

## 2017-07-27 LAB — PROTIME-INR
INR: 0.98
Prothrombin Time: 12.9 seconds (ref 11.4–15.2)

## 2017-07-27 LAB — APTT: aPTT: 25 seconds (ref 24–36)

## 2017-07-27 SURGERY — RADIOLOGY WITH ANESTHESIA
Anesthesia: General

## 2017-07-27 MED ORDER — SODIUM CHLORIDE 0.9 % IV SOLN
INTRAVENOUS | Status: DC | PRN
  Administered 2017-07-27: 22:00:00 via INTRAVENOUS

## 2017-07-27 MED ORDER — ALTEPLASE (STROKE) FULL DOSE INFUSION
0.9000 mg/kg | Freq: Once | INTRAVENOUS | Status: AC
  Administered 2017-07-27: 81 mg via INTRAVENOUS
  Filled 2017-07-27: qty 100

## 2017-07-27 MED ORDER — LABETALOL HCL 5 MG/ML IV SOLN
INTRAVENOUS | Status: AC
  Filled 2017-07-27: qty 4

## 2017-07-27 MED ORDER — FENTANYL CITRATE (PF) 100 MCG/2ML IJ SOLN
INTRAMUSCULAR | Status: AC
  Filled 2017-07-27: qty 2

## 2017-07-27 MED ORDER — PHENYLEPHRINE HCL 10 MG/ML IJ SOLN
INTRAVENOUS | Status: DC | PRN
  Administered 2017-07-27: 20 ug/min via INTRAVENOUS

## 2017-07-27 MED ORDER — SENNOSIDES-DOCUSATE SODIUM 8.6-50 MG PO TABS: 1.0000 | ORAL_TABLET | Freq: Every evening | ORAL | Status: DC | PRN

## 2017-07-27 MED ORDER — NITROGLYCERIN 1 MG/10 ML FOR IR/CATH LAB
INTRA_ARTERIAL | Status: AC
  Filled 2017-07-27: qty 10

## 2017-07-27 MED ORDER — NICARDIPINE HCL IN NACL 20-0.86 MG/200ML-% IV SOLN
0.0000 mg/h | INTRAVENOUS | Status: DC
Start: 2017-07-27 — End: 2017-07-28
  Administered 2017-07-28: 5 mg/h via INTRAVENOUS
  Filled 2017-07-27: qty 200

## 2017-07-27 MED ORDER — SUCCINYLCHOLINE CHLORIDE 20 MG/ML IJ SOLN
INTRAMUSCULAR | Status: DC | PRN
  Administered 2017-07-27: 100 mg via INTRAVENOUS

## 2017-07-27 MED ORDER — PHENYLEPHRINE HCL 10 MG/ML IJ SOLN
INTRAMUSCULAR | Status: DC | PRN
  Administered 2017-07-27 (×3): 40 ug via INTRAVENOUS

## 2017-07-27 MED ORDER — IOPAMIDOL (ISOVUE-300) INJECTION 61%
INTRAVENOUS | Status: AC
  Administered 2017-07-28: 40 mL
  Filled 2017-07-27: qty 150

## 2017-07-27 MED ORDER — PROPOFOL 10 MG/ML IV BOLUS
INTRAVENOUS | Status: DC | PRN
  Administered 2017-07-27: 200 mg via INTRAVENOUS

## 2017-07-27 MED ORDER — CEFAZOLIN SODIUM-DEXTROSE 2-3 GM-%(50ML) IV SOLR
INTRAVENOUS | Status: DC | PRN
  Administered 2017-07-27: 2 g via INTRAVENOUS

## 2017-07-27 MED ORDER — SODIUM CHLORIDE 0.9 % IV SOLN: Freq: Once | INTRAVENOUS | Status: DC

## 2017-07-27 MED ORDER — FENTANYL CITRATE (PF) 100 MCG/2ML IJ SOLN
INTRAMUSCULAR | Status: DC | PRN
  Administered 2017-07-27 – 2017-07-28 (×2): 100 ug via INTRAVENOUS

## 2017-07-27 MED ORDER — ACETAMINOPHEN 650 MG RE SUPP: 650.0000 mg | RECTAL | Status: DC | PRN

## 2017-07-27 MED ORDER — ACETAMINOPHEN 325 MG PO TABS: 650.0000 mg | ORAL_TABLET | ORAL | Status: DC | PRN

## 2017-07-27 MED ORDER — ACETAMINOPHEN 160 MG/5ML PO SOLN
650.0000 mg | ORAL | Status: DC | PRN
  Administered 2017-07-30: 650 mg
  Filled 2017-07-27: qty 20.3

## 2017-07-27 MED ORDER — EPTIFIBATIDE 20 MG/10ML IV SOLN
INTRAVENOUS | Status: AC
  Filled 2017-07-27: qty 10

## 2017-07-27 MED ORDER — LABETALOL HCL 5 MG/ML IV SOLN
20.0000 mg | Freq: Once | INTRAVENOUS | Status: AC
  Administered 2017-07-28: 20 mg via INTRAVENOUS

## 2017-07-27 MED ORDER — IOPAMIDOL (ISOVUE-370) INJECTION 76%
INTRAVENOUS | Status: AC
  Administered 2017-07-27: 21:00:00
  Filled 2017-07-27: qty 100

## 2017-07-27 MED ORDER — CEFAZOLIN SODIUM-DEXTROSE 2-4 GM/100ML-% IV SOLN
INTRAVENOUS | Status: AC
  Filled 2017-07-27: qty 100

## 2017-07-27 MED ORDER — SODIUM CHLORIDE 0.9 % IV SOLN
INTRAVENOUS | Status: DC
  Administered 2017-07-28: 01:00:00 via INTRAVENOUS
  Administered 2017-07-28: 75 mL/h via INTRAVENOUS

## 2017-07-27 MED ORDER — STROKE: EARLY STAGES OF RECOVERY BOOK
Freq: Once | Status: AC
  Administered 2017-07-28: 01:00:00
  Filled 2017-07-27: qty 1

## 2017-07-27 MED ORDER — ROCURONIUM BROMIDE 100 MG/10ML IV SOLN
INTRAVENOUS | Status: DC | PRN
  Administered 2017-07-27: 40 mg via INTRAVENOUS
  Administered 2017-07-27: 50 mg via INTRAVENOUS

## 2017-07-27 NOTE — Anesthesia Preprocedure Evaluation (Addendum)
Anesthesia Evaluation  Patient identified by MRN, date of birth, ID band Patient confused    Reviewed: Allergy & Precautions, H&P , NPO status , Patient's Chart, lab work & pertinent test results  Airway Mallampati: II  TM Distance: >3 FB Neck ROM: Full    Dental no notable dental hx. (+) Teeth Intact, Dental Advisory Given   Pulmonary asthma , sleep apnea ,    Pulmonary exam normal breath sounds clear to auscultation       Cardiovascular hypertension, Pt. on medications  Rhythm:Regular Rate:Normal     Neuro/Psych Anxiety Depression CVA, Residual Symptoms    GI/Hepatic negative GI ROS, Neg liver ROS,   Endo/Other  negative endocrine ROS  Renal/GU negative Renal ROS  negative genitourinary   Musculoskeletal   Abdominal   Peds  Hematology negative hematology ROS (+)   Anesthesia Other Findings   Reproductive/Obstetrics negative OB ROS                            Anesthesia Physical Anesthesia Plan  ASA: III and emergent  Anesthesia Plan: General   Post-op Pain Management:    Induction: Intravenous, Rapid sequence and Cricoid pressure planned  PONV Risk Score and Plan: 2 and Ondansetron and Dexamethasone  Airway Management Planned: Oral ETT  Additional Equipment: Arterial line  Intra-op Plan:   Post-operative Plan: Post-operative intubation/ventilation  Informed Consent: I have reviewed the patients History and Physical, chart, labs and discussed the procedure including the risks, benefits and alternatives for the proposed anesthesia with the patient or authorized representative who has indicated his/her understanding and acceptance.   Dental advisory given  Plan Discussed with: CRNA  Anesthesia Plan Comments:         Anesthesia Quick Evaluation

## 2017-07-27 NOTE — Anesthesia Procedure Notes (Signed)
Procedure Name: Intubation Date/Time: 07/27/2017 10:01 PM Performed by: Babs Bertin, CRNA Pre-anesthesia Checklist: Patient identified, Emergency Drugs available, Suction available and Patient being monitored Patient Re-evaluated:Patient Re-evaluated prior to induction Oxygen Delivery Method: Circle System Utilized Preoxygenation: Pre-oxygenation with 100% oxygen Induction Type: IV induction, Rapid sequence and Cricoid Pressure applied Laryngoscope Size: Mac and 3 Grade View: Grade I Tube type: Subglottic suction tube Tube size: 7.5 mm Number of attempts: 1 Airway Equipment and Method: Stylet and Oral airway Placement Confirmation: ETT inserted through vocal cords under direct vision,  positive ETCO2 and breath sounds checked- equal and bilateral Secured at: 22 cm Tube secured with: Tape Dental Injury: Teeth and Oropharynx as per pre-operative assessment

## 2017-07-27 NOTE — Progress Notes (Signed)
Pharmacist Code Stroke Response  Notified to mix tPA at 2103 by Dr. Wilford CornerArora Delivered tPA to RN at 2106  Issues/delays encountered (if applicable): None  Katherine MantleHeysel Lam, PharmD Candidate   Babs BertinHaley Ura Hausen, PharmD, BCPS Clinical Pharmacist 07/27/2017 9:11 PM

## 2017-07-27 NOTE — ED Provider Notes (Signed)
[facility] EMERGENCY DEPARTMENT Provider Note   CSN: [id number] Arrival date & time: 07/27/17  2056     History   Chief Complaint No chief complaint on file.   HPI Jeremy [name] is a 42 y.o. male.  The history is provided by the EMS personnel.  Cerebrovascular Accident  This is a new problem. The current episode started less than 1 hour ago. The problem occurs constantly. The problem has not changed since onset.Associated symptoms comments: Unable to perform ROS. Nothing aggravates the symptoms. Nothing relieves the symptoms. He has tried nothing for the symptoms.    Past Medical History:  Diagnosis Date  . Anxiety   . Asthma    MILD AS A CHILD  . Depression   . Deviated septum   . Dyspnea    NORMAL SPIROMETRY 02/01/17 VISIT WITH DR HEDRICK  . Hypertension   . Sleep apnea     There are no active problems to display for this patient.   Past Surgical History:  Procedure Laterality Date  . NASAL SEPTOPLASTY W/ TURBINOPLASTY Bilateral 03/06/2017   Procedure: NASAL SEPTOPLASTY WITH TURBINATE REDUCTION;  Surgeon: Linus Salmons, MD;  Location: [facility] ORS;  Service: ENT;  Laterality: Bilateral;       Home Medications    Prior to Admission medications   Medication Sig Start Date End Date Taking? Authorizing Provider  amLODipine (NORVASC) 10 MG tablet Take 10 mg by mouth at bedtime. 10/20/16   [provider]  KLOR-CON M20 20 MEQ tablet Take 40 mEq by mouth daily. 01/01/17   [provider]  oxyCODONE-acetaminophen (ROXICET) 5-325 MG tablet Take 1-2 tablets by mouth every 4 (four) hours as needed for severe pain. 03/06/17   Linus Salmons, MD  sulfamethoxazole-trimethoprim (BACTRIM DS,SEPTRA DS) 800-160 MG tablet Take 1 tablet by mouth 2 (two) times daily. 03/06/17   Linus Salmons, MD  triamterene-hydrochlorothiazide (MAXZIDE-25) 37.5-25 MG tablet Take 1 tablet by mouth daily. 01/04/17   [provider]    Family History No  family history on file.  Social History Social History   Tobacco Use  . Smoking status: Never Smoker  . Smokeless tobacco: Never Used  Substance Use Topics  . Alcohol use: Yes    Comment: RARE  . Drug use: No     Allergies   Patient has no known allergies.   Review of Systems Review of Systems  Unable to perform ROS: Acuity of condition     Physical Exam Updated Vital Signs Ht 5\' 10"  (1.778 m)   Wt 90.1 kg (198 lb 9.6 oz)   BMI 28.50 kg/m   Physical Exam  Constitutional: He appears well-developed and well-nourished.  HENT:  Head: Normocephalic and atraumatic.  Eyes: Conjunctivae are normal.  Neck: Neck supple.  Cardiovascular: Normal rate and regular rhythm.  No murmur heard. Pulmonary/Chest: Effort normal. No respiratory distress.  Abdominal: Soft. There is no tenderness.  Musculoskeletal: He exhibits no edema.  Neurological: He is alert.  Head tilted to the left w/left gaze preference, right facial droop, right hemiparesis, decreased sensation on the right; able to answer questions appropriately  Skin: Skin is warm and dry.  Psychiatric: He has a normal mood and affect.  Nursing note and vitals reviewed.    ED Treatments / Results  Labs (all labs ordered are listed, but only abnormal results are displayed) Labs Reviewed  I-STAT CHEM 8, ED - Abnormal; Notable for the following components:      Result Value   Chloride 99 (*)  BUN 25 (*)    Creatinine, Ser 1.30 (*)    Glucose, Bld 125 (*)    Calcium, Ion 1.09 (*)    All other components within normal limits  CBC  DIFFERENTIAL  PROTIME-INR  APTT  COMPREHENSIVE METABOLIC PANEL  I-STAT TROPONIN, ED  CBG MONITORING, ED    EKG  EKG Interpretation None       Radiology No results found.  Procedures Procedures (including critical care time)  Medications Ordered in ED Medications  iopamidol (ISOVUE-370) 76 % injection (not administered)     Initial Impression / Assessment and Plan /  ED Course  I have reviewed the triage vital signs and the nursing notes.  Pertinent labs & imaging results that were available during my care of the patient were reviewed by me and considered in my medical decision making (see chart for details).     Pt presents as a CODE STROKE. LKN 2000. EMS reports that the Pt had been complaining of a HA for the last 3days and thought he was coming down with the flu. This evening, the Pt's wife heard a noise and found that her husband had fallen out of bed; when she assessed him, he had neurological deficits, so she called 911.  VS & exam as above. Airway patency confirmed on arrival.  Neurology accompanied the Pt to CT, reviewed his images, and started tPA for a left MCA occlusion. Pt taken directly from CT to the IR suite for thrombectomy & will be admitted to the ICU upon completion of his procedure.  Final Clinical Impressions(s) / ED Diagnoses   Final diagnoses:  Cerebrovascular accident (CVA) due to occlusion of left middle cerebral artery [facility](HCC)    ED Discharge Orders    None       Forest BeckerPetit, Alexsis Branscom, MD 07/27/17 2130    Loren RacerYelverton, David, MD 07/27/17 2223

## 2017-07-27 NOTE — H&P (View-Only) (Signed)
STROKE H&P  CC: Code stroke  History is obtained from: Patient, chart, family  HPI: Demorio Seeley is a 42 y.o. male past history of hypertension, on antihypertensives, who was in usual state of health last seen normal at 8 PM today by his wife, was noted to fall off of bed around 8:10 PM and found to have left gaze preference, right facial droop, slurred speech and right-sided weakness.  EMS arrived on site, blood pressure was in the normal range.  CBG was in the normal range.  He had dense right hemiplegia, right-sided neglect, left gaze preference, right facial droop and initially incoherent speech but then only slurred speech. He was brought into the emergency room at [facility] and evaluated on the bridge.  On arrival, initial NIH stroke scale was 20.  Noncontrast CT of the head was negative for bleed.  It did show a dense left MCA and and aspects of 8.  IV TPA was initiated at 9:11 PM.  CT angiogram and perfusion study were done.  Confirmed the left MCA occlusion.  Endovascular team was consulted even prior to obtaining the CT angiogram and the patient was taken in for endovascular thrombectomy.  He is currently in the neuro interventional radiology suite.   LKW: 8 PM on 07/27/2017 tpa given?: yes Premorbid modified Rankin scale (mRS): 0  ROS: Only pertinent for headache that he had been having for the past couple of days.  Rest of the systems reviewed and the review of systems was negative.  Past Medical History:  Diagnosis Date  . Anxiety   . Asthma    MILD AS A CHILD  . Depression   . Deviated septum   . Dyspnea    NORMAL SPIROMETRY 02/01/17 VISIT WITH DR HEDRICK  . Hypertension   . Sleep apnea    No family history on file. No family history of strokes in young, no family history of heart attacks in young.  Grandfather had a heart attack.  Social History:   reports that  has never smoked. he has never used smokeless tobacco. He reports that he drinks alcohol. He reports that he does  not use drugs.  Denies tobacco drug or alcohol use.  Medications  Current Facility-Administered Medications:  .  alteplase (ACTIVASE) 1 mg/mL infusion 81 mg, 0.9 mg/kg, Intravenous, Once, Milon Dikes, MD  Current Outpatient Medications:  .  amLODipine (NORVASC) 10 MG tablet, Take 10 mg by mouth at bedtime., Disp: , Rfl: 3 .  KLOR-CON M20 20 MEQ tablet, Take 40 mEq by mouth daily., Disp: , Rfl: 11 .  oxyCODONE-acetaminophen (ROXICET) 5-325 MG tablet, Take 1-2 tablets by mouth every 4 (four) hours as needed for severe pain., Disp: 40 tablet, Rfl: 0 .  sulfamethoxazole-trimethoprim (BACTRIM DS,SEPTRA DS) 800-160 MG tablet, Take 1 tablet by mouth 2 (two) times daily., Disp: 20 tablet, Rfl: 0 .  triamterene-hydrochlorothiazide (MAXZIDE-25) 37.5-25 MG tablet, Take 1 tablet by mouth daily., Disp: , Rfl: 11  Exam: Current vital signs: Ht 5\' 10"  (1.778 m)   Wt 90.1 kg (198 lb 9.6 oz)   BMI 28.50 kg/m  Vital signs in last 24 hours: Weight:  [90.1 kg (198 lb 9.6 oz)] 90.1 kg (198 lb 9.6 oz) (01/11 2000) General: Awake alert in no apparent distress HEENT: Normocephalic, atraumatic, dry mucous membranes Lungs clear to auscultation Abdomen: Nondistended nontender Extremities warm and well-perfused Neurological exam Mental status: Alert awake oriented x3 Speech is severely dysarthric Naming comprehension repetition are intact Cranial nerve exam: Pupils equal  round reactive to light, forced gaze deviation to the left, visual field examination showed right homonymous hemianopsia, right facial weakness upper and lower face. Motor exam: Flaccid right upper and lower extremity.  Full strength left upper and left lower extremity Sensory exam: Dense sensory loss on the right hemibody.  Intact sensation on the left Coordination: Intact finger nose finger on the left.  Unable to perform on the right Gait testing was deferred NIH-20  Labs I have reviewed labs in epic and the results pertinent to  this consultation are: CBC    Component Value Date/Time   WBC 6.6 07/27/2017 2055   RBC 5.17 07/27/2017 2055   HGB 16.3 07/27/2017 2103   HCT 48.0 07/27/2017 2103   PLT 228 07/27/2017 2055   MCV 88.0 07/27/2017 2055   MCH 31.1 07/27/2017 2055   MCHC 35.4 07/27/2017 2055   RDW 12.3 07/27/2017 2055   LYMPHSABS 2.7 07/27/2017 2055   MONOABS 0.6 07/27/2017 2055   EOSABS 0.1 07/27/2017 2055   BASOSABS 0.0 07/27/2017 2055   CMP     Component Value Date/Time   NA 138 07/27/2017 2103   K 4.1 07/27/2017 2103   CL 99 (L) 07/27/2017 2103   GLUCOSE 125 (H) 07/27/2017 2103   BUN 25 (H) 07/27/2017 2103   CREATININE 1.30 (H) 07/27/2017 2103   Imaging I have reviewed the images obtained: CT-scan of the brain - LMCA stroke, dense LMCA, ASPECTS 8, no bleed CTA head/neck: left M1 occlusion, CTP shows large core >70cc, mismatch ratio 1.9.  Assessment:  [age] year old with a past history of hypertension with sudden onset of right-sided hemiplegia, right facial droop, slurred speech and forced left gaze deviation consistent with a left MCA syndrome. Noncontrast CT negative for bleed.  IV TPA administered.  Endovascular team paged and and route to take patient for the endovascular thrombectomy intervention.  CT angios perfusion shows large cord but this is in the hyper acute stage and the rapid software perfusion mismatch is not reliable at this time.  With an aspect score of 8, he has a very good candidate for endovascular thrombectomy. He will be admitted for post TPA and thrombectomy care and stroke workup.  Impression: Acute ischemic stroke Cerebral infarction due to embolism of the left middle cerebral artery Acuity: Acute Current suspected etiology: Under investigation Continue evaluation Admit to neuro ICU Hold aspirin/subcu heparin/Lovenox for at least 24 hours after TPA to limiting is stable without any evidence of bleed. Blood pressure control-goal systolic less than 180 status post  TPA.  If successful mechanical thrombectomy is obtained, then the systolic goal would be less than 140. MRI brain without contrast Echocardiogram Hemoglobin A1c Lipid panel Hyperglycemia management per ICU to maintain glucose between 140-180 PT/OT/speech therapy Consider TEE Consider stroke risk factor in young workup Check UDS  CNS -Close neuro monitoring  Dysarthria Dysphagia following cerebral infarction  -NPO until cleared by speech -ST  Hemiplegia and hemiparesis following cerebral infarction affecting right dominant side -PM&R consult  RESP Intubated for the thrombectomy -vent management per ICU -wean when able  CV Essential (primary) hypertension -Aggressive BP control as above -Labetalol as needed boluses and Cardene drip as needed -Transthoracic echo  Hyperlipidemia, unspecified -Statin for LDL goal of less than 70  -Maintain on telemetry for any evidence of A. Fib -Might need TEE for any evidence of PFO  HEME -Monitor -transfuse for hgb < 7  ENDO -goal HgbA1c < 7  GI/GU Creatinine elevated at 1.3. Trend labs Gentle hydration  Fluid/Electrolyte Disorders P labs and replete per ICU protocol  ID Possible Aspiration PNA -CXR -NPO -Monitor  Prophylaxis DVT: SCD GI: PPI Bowel: Docusate/senna  Diet: NPO until cleared by speech  Code Status: Full Code  I spoke with his wife and mother at bedside.  I appraise them of the current clinical situation and the emergent need for IV TPA and endovascularthrombectomy. I answered all their questions  -- Milon Dikes, MD [facility] Pager: [phone] If 7pm to 7am, please call on call as listed on AMION.   CRITICAL CARE ATTESTATION This patient is critically ill and at significant risk of neurological worsening, death and care requires constant monitoring of vital signs, hemodynamics,respiratory and cardiac monitoring. I spent 60  minutes of neurocritical care time performing  neurological assessment, discussion with family, other specialists and medical decision making of high complexityin the care of  this patient.

## 2017-07-27 NOTE — Consult Note (Addendum)
STROKE H&P  CC: Code stroke  History is obtained from: Patient, chart, family  HPI: Jeremy [name] is a 42 y.o. male past history of hypertension, on antihypertensives, who was in usual state of health last seen normal at 8 PM today by his wife, was noted to fall off of bed around 8:10 PM and found to have left gaze preference, right facial droop, slurred speech and right-sided weakness.  EMS arrived on site, blood pressure was in the normal range.  CBG was in the normal range.  He had dense right hemiplegia, right-sided neglect, left gaze preference, right facial droop and initially incoherent speech but then only slurred speech. He was brought into the emergency room at [facility] and evaluated on the bridge.  On arrival, initial NIH stroke scale was 20.  Noncontrast CT of the head was negative for bleed.  It did show a dense left MCA and and aspects of 8.  IV TPA was initiated at 9:11 PM.  CT angiogram and perfusion study were done.  Confirmed the left MCA occlusion.  Endovascular team was consulted even prior to obtaining the CT angiogram and the patient was taken in for endovascular thrombectomy.  He is currently in the neuro interventional radiology suite.   LKW: 8 PM on 07/27/2017 tpa given?: yes Premorbid modified Rankin scale (mRS): 0  ROS: Only pertinent for headache that he had been having for the past couple of days.  Rest of the systems reviewed and the review of systems was negative.  Past Medical History:  Diagnosis Date  . Anxiety   . Asthma    MILD AS A CHILD  . Depression   . Deviated septum   . Dyspnea    NORMAL SPIROMETRY 02/01/17 VISIT WITH DR HEDRICK  . Hypertension   . Sleep apnea    No family history on file. No family history of strokes in young, no family history of heart attacks in young.  Grandfather had a heart attack.  Social History:   reports that  has never smoked. he has never used smokeless tobacco. He reports that he drinks alcohol. He reports that he does  not use drugs.  Denies tobacco drug or alcohol use.  Medications  Current Facility-Administered Medications:  .  alteplase (ACTIVASE) 1 mg/mL infusion 81 mg, 0.9 mg/kg, Intravenous, Once, Milon Dikes, MD  Current Outpatient Medications:  .  amLODipine (NORVASC) 10 MG tablet, Take 10 mg by mouth at bedtime., Disp: , Rfl: 3 .  KLOR-CON M20 20 MEQ tablet, Take 40 mEq by mouth daily., Disp: , Rfl: 11 .  oxyCODONE-acetaminophen (ROXICET) 5-325 MG tablet, Take 1-2 tablets by mouth every 4 (four) hours as needed for severe pain., Disp: 40 tablet, Rfl: 0 .  sulfamethoxazole-trimethoprim (BACTRIM DS,SEPTRA DS) 800-160 MG tablet, Take 1 tablet by mouth 2 (two) times daily., Disp: 20 tablet, Rfl: 0 .  triamterene-hydrochlorothiazide (MAXZIDE-25) 37.5-25 MG tablet, Take 1 tablet by mouth daily., Disp: , Rfl: 11  Exam: Current vital signs: Ht 5\' 10"  (1.778 m)   Wt 90.1 kg (198 lb 9.6 oz)   BMI 28.50 kg/m  Vital signs in last 24 hours: Weight:  [90.1 kg (198 lb 9.6 oz)] 90.1 kg (198 lb 9.6 oz) (01/11 2000) General: Awake alert in no apparent distress HEENT: Normocephalic, atraumatic, dry mucous membranes Lungs clear to auscultation Abdomen: Nondistended nontender Extremities warm and well-perfused Neurological exam Mental status: Alert awake oriented x3 Speech is severely dysarthric Naming comprehension repetition are intact Cranial nerve exam: Pupils equal  round reactive to light, forced gaze deviation to the left, visual field examination showed right homonymous hemianopsia, right facial weakness upper and lower face. Motor exam: Flaccid right upper and lower extremity.  Full strength left upper and left lower extremity Sensory exam: Dense sensory loss on the right hemibody.  Intact sensation on the left Coordination: Intact finger nose finger on the left.  Unable to perform on the right Gait testing was deferred NIH-20  Labs I have reviewed labs in epic and the results pertinent to  this consultation are: CBC    Component Value Date/Time   WBC 6.6 07/27/2017 2055   RBC 5.17 07/27/2017 2055   HGB 16.3 07/27/2017 2103   HCT 48.0 07/27/2017 2103   PLT 228 07/27/2017 2055   MCV 88.0 07/27/2017 2055   MCH 31.1 07/27/2017 2055   MCHC 35.4 07/27/2017 2055   RDW 12.3 07/27/2017 2055   LYMPHSABS 2.7 07/27/2017 2055   MONOABS 0.6 07/27/2017 2055   EOSABS 0.1 07/27/2017 2055   BASOSABS 0.0 07/27/2017 2055   CMP     Component Value Date/Time   NA 138 07/27/2017 2103   K 4.1 07/27/2017 2103   CL 99 (L) 07/27/2017 2103   GLUCOSE 125 (H) 07/27/2017 2103   BUN 25 (H) 07/27/2017 2103   CREATININE 1.30 (H) 07/27/2017 2103   Imaging I have reviewed the images obtained: CT-scan of the brain - LMCA stroke, dense LMCA, ASPECTS 8, no bleed CTA head/neck: left M1 occlusion, CTP shows large core >70cc, mismatch ratio 1.9.  Assessment:  [age] year old with a past history of hypertension with sudden onset of right-sided hemiplegia, right facial droop, slurred speech and forced left gaze deviation consistent with a left MCA syndrome. Noncontrast CT negative for bleed.  IV TPA administered.  Endovascular team paged and and route to take patient for the endovascular thrombectomy intervention.  CT angios perfusion shows large cord but this is in the hyper acute stage and the rapid software perfusion mismatch is not reliable at this time.  With an aspect score of 8, he has a very good candidate for endovascular thrombectomy. He will be admitted for post TPA and thrombectomy care and stroke workup.  Impression: Acute ischemic stroke Cerebral infarction due to embolism of the left middle cerebral artery Acuity: Acute Current suspected etiology: Under investigation Continue evaluation Admit to neuro ICU Hold aspirin/subcu heparin/Lovenox for at least 24 hours after TPA to limiting is stable without any evidence of bleed. Blood pressure control-goal systolic less than 180 status post  TPA.  If successful mechanical thrombectomy is obtained, then the systolic goal would be less than 140. MRI brain without contrast Echocardiogram Hemoglobin A1c Lipid panel Hyperglycemia management per ICU to maintain glucose between 140-180 PT/OT/speech therapy Consider TEE Consider stroke risk factor in young workup Check UDS  CNS -Close neuro monitoring  Dysarthria Dysphagia following cerebral infarction  -NPO until cleared by speech -ST  Hemiplegia and hemiparesis following cerebral infarction affecting right dominant side -PM&R consult  RESP Intubated for the thrombectomy -vent management per ICU -wean when able  CV Essential (primary) hypertension -Aggressive BP control as above -Labetalol as needed boluses and Cardene drip as needed -Transthoracic echo  Hyperlipidemia, unspecified -Statin for LDL goal of less than 70  -Maintain on telemetry for any evidence of A. Fib -Might need TEE for any evidence of PFO  HEME -Monitor -transfuse for hgb < 7  ENDO -goal HgbA1c < 7  GI/GU Creatinine elevated at 1.3. Trend labs Gentle hydration  Fluid/Electrolyte Disorders P labs and replete per ICU protocol  ID Possible Aspiration PNA -CXR -NPO -Monitor  Prophylaxis DVT: SCD GI: PPI Bowel: Docusate/senna  Diet: NPO until cleared by speech  Code Status: Full Code  I spoke with his wife and mother at bedside.  I appraise them of the current clinical situation and the emergent need for IV TPA and endovascularthrombectomy. I answered all their questions  -- Milon Dikes, MD [facility] Pager: [phone] If 7pm to 7am, please call on call as listed on AMION.   CRITICAL CARE ATTESTATION This patient is critically ill and at significant risk of neurological worsening, death and care requires constant monitoring of vital signs, hemodynamics,respiratory and cardiac monitoring. I spent 60  minutes of neurocritical care time performing  neurological assessment, discussion with family, other specialists and medical decision making of high complexityin the care of  this patient.

## 2017-07-27 NOTE — ED Notes (Signed)
Per [facility], patient was found by spouse face down on the floor after falling off the bed. Upon EMs arrival, patient was found to be flaccid on the right side, right side face droop, slurred speech, right side neglect. Patient is A&O X4, VS stable, follows command, only history per patient is HTN

## 2017-07-28 ENCOUNTER — Inpatient Hospital Stay (HOSPITAL_COMMUNITY): Payer: BLUE CROSS/BLUE SHIELD

## 2017-07-28 DIAGNOSIS — G936 Cerebral edema: Secondary | ICD-10-CM

## 2017-07-28 DIAGNOSIS — I63512 Cerebral infarction due to unspecified occlusion or stenosis of left middle cerebral artery: Secondary | ICD-10-CM

## 2017-07-28 DIAGNOSIS — I63032 Cerebral infarction due to thrombosis of left carotid artery: Secondary | ICD-10-CM

## 2017-07-28 DIAGNOSIS — I639 Cerebral infarction, unspecified: Secondary | ICD-10-CM

## 2017-07-28 DIAGNOSIS — I10 Essential (primary) hypertension: Secondary | ICD-10-CM

## 2017-07-28 DIAGNOSIS — N179 Acute kidney failure, unspecified: Secondary | ICD-10-CM

## 2017-07-28 DIAGNOSIS — E782 Mixed hyperlipidemia: Secondary | ICD-10-CM

## 2017-07-28 DIAGNOSIS — E785 Hyperlipidemia, unspecified: Secondary | ICD-10-CM

## 2017-07-28 DIAGNOSIS — J9601 Acute respiratory failure with hypoxia: Secondary | ICD-10-CM

## 2017-07-28 HISTORY — PX: IR CT HEAD LTD: IMG2386

## 2017-07-28 HISTORY — PX: IR ANGIO INTRA EXTRACRAN SEL COM CAROTID INNOMINATE UNI R MOD SED: IMG5359

## 2017-07-28 HISTORY — PX: IR PERCUTANEOUS ART THROMBECTOMY/INFUSION INTRACRANIAL INC DIAG ANGIO: IMG6087

## 2017-07-28 HISTORY — PX: IR ANGIO VERTEBRAL SEL VERTEBRAL UNI R MOD SED: IMG5368

## 2017-07-28 LAB — RAPID URINE DRUG SCREEN, HOSP PERFORMED
AMPHETAMINES: NOT DETECTED
BARBITURATES: NOT DETECTED
BENZODIAZEPINES: NOT DETECTED
Cocaine: NOT DETECTED
Opiates: NOT DETECTED
TETRAHYDROCANNABINOL: NOT DETECTED

## 2017-07-28 LAB — CBC WITH DIFFERENTIAL/PLATELET
BASOS ABS: 0 10*3/uL (ref 0.0–0.1)
BASOS PCT: 0 %
EOS ABS: 0 10*3/uL (ref 0.0–0.7)
Eosinophils Relative: 0 %
HCT: 37.9 % — ABNORMAL LOW (ref 39.0–52.0)
Hemoglobin: 13.2 g/dL (ref 13.0–17.0)
Lymphocytes Relative: 16 %
Lymphs Abs: 1.3 10*3/uL (ref 0.7–4.0)
MCH: 30.5 pg (ref 26.0–34.0)
MCHC: 34.8 g/dL (ref 30.0–36.0)
MCV: 87.5 fL (ref 78.0–100.0)
MONO ABS: 0.5 10*3/uL (ref 0.1–1.0)
MONOS PCT: 6 %
Neutro Abs: 6.3 10*3/uL (ref 1.7–7.7)
Neutrophils Relative %: 78 %
PLATELETS: 203 10*3/uL (ref 150–400)
RBC: 4.33 MIL/uL (ref 4.22–5.81)
RDW: 12.3 % (ref 11.5–15.5)
WBC: 8 10*3/uL (ref 4.0–10.5)

## 2017-07-28 LAB — POCT I-STAT 3, ART BLOOD GAS (G3+)
ACID-BASE DEFICIT: 2 mmol/L (ref 0.0–2.0)
BICARBONATE: 23.5 mmol/L (ref 20.0–28.0)
O2 Saturation: 99 %
TCO2: 25 mmol/L (ref 22–32)
pCO2 arterial: 43.2 mmHg (ref 32.0–48.0)
pH, Arterial: 7.341 — ABNORMAL LOW (ref 7.350–7.450)
pO2, Arterial: 170 mmHg — ABNORMAL HIGH (ref 83.0–108.0)

## 2017-07-28 LAB — BASIC METABOLIC PANEL
Anion gap: 7 (ref 5–15)
BUN: 18 mg/dL (ref 6–20)
CO2: 24 mmol/L (ref 22–32)
CREATININE: 0.95 mg/dL (ref 0.61–1.24)
Calcium: 8.2 mg/dL — ABNORMAL LOW (ref 8.9–10.3)
Chloride: 105 mmol/L (ref 101–111)
GFR calc Af Amer: >60 mL/min (ref >60)
Glucose, Bld: 123 mg/dL — ABNORMAL HIGH (ref 65–99)
POTASSIUM: 3.9 mmol/L (ref 3.5–5.1)
SODIUM: 136 mmol/L (ref 135–145)

## 2017-07-28 LAB — PHOSPHORUS: Phosphorus: 3.2 mg/dL (ref 2.5–4.6)

## 2017-07-28 LAB — MRSA PCR SCREENING: MRSA BY PCR: NEGATIVE

## 2017-07-28 LAB — TRIGLYCERIDES: TRIGLYCERIDES: 428 mg/dL — AB (ref <150)

## 2017-07-28 LAB — MAGNESIUM: Magnesium: 2.2 mg/dL (ref 1.7–2.4)

## 2017-07-28 LAB — FIBRINOGEN
FIBRINOGEN: 389 mg/dL (ref 210–475)
FIBRINOGEN: 387 mg/dL (ref 210–475)

## 2017-07-28 LAB — LIPID PANEL
Cholesterol: 218 mg/dL — ABNORMAL HIGH (ref 0–200)
HDL: 28 mg/dL — AB (ref >40)
LDL Cholesterol: UNDETERMINED mg/dL (ref 0–99)
Total CHOL/HDL Ratio: 7.8 RATIO
Triglycerides: 417 mg/dL — ABNORMAL HIGH (ref <150)
VLDL: UNDETERMINED mg/dL (ref 0–40)

## 2017-07-28 LAB — HEMOGLOBIN A1C
HEMOGLOBIN A1C: 5.2 % (ref 4.8–5.6)
MEAN PLASMA GLUCOSE: 102.54 mg/dL

## 2017-07-28 LAB — SODIUM
SODIUM: 137 mmol/L (ref 135–145)
Sodium: 139 mmol/L (ref 135–145)

## 2017-07-28 LAB — ABO/RH: ABO/RH(D): B POS

## 2017-07-28 MED ORDER — ACETAMINOPHEN 160 MG/5ML PO SOLN: 650.0000 mg | ORAL | Status: DC | PRN

## 2017-07-28 MED ORDER — CHLORHEXIDINE GLUCONATE 0.12% ORAL RINSE (MEDLINE KIT)
15.0000 mL | Freq: Two times a day (BID) | OROMUCOSAL | Status: DC
  Administered 2017-07-28 – 2017-08-02 (×11): 15 mL via OROMUCOSAL

## 2017-07-28 MED ORDER — FENTANYL CITRATE (PF) 100 MCG/2ML IJ SOLN
100.0000 ug | INTRAMUSCULAR | Status: DC | PRN
  Administered 2017-07-28 – 2017-07-31 (×7): 100 ug via INTRAVENOUS
  Filled 2017-07-28 (×7): qty 2

## 2017-07-28 MED ORDER — PROPOFOL 500 MG/50ML IV EMUL
INTRAVENOUS | Status: DC | PRN
  Administered 2017-07-28: 30 ug/kg/min via INTRAVENOUS

## 2017-07-28 MED ORDER — HYDRALAZINE HCL 20 MG/ML IJ SOLN: 10.0000 mg | INTRAMUSCULAR | Status: DC | PRN

## 2017-07-28 MED ORDER — SODIUM CHLORIDE 0.9 % IV SOLN
250.0000 mL | INTRAVENOUS | Status: DC | PRN
  Administered 2017-08-06: 08:00:00 via INTRAVENOUS

## 2017-07-28 MED ORDER — ACETAMINOPHEN 650 MG RE SUPP: 650.0000 mg | RECTAL | Status: DC | PRN

## 2017-07-28 MED ORDER — MIDAZOLAM HCL 2 MG/2ML IJ SOLN
INTRAMUSCULAR | Status: AC
  Administered 2017-07-28: 4 mg
  Filled 2017-07-28: qty 4

## 2017-07-28 MED ORDER — LIDOCAINE HCL (PF) 1 % IJ SOLN
0.0000 mL | Freq: Once | INTRAMUSCULAR | Status: DC | PRN
  Filled 2017-07-28: qty 30

## 2017-07-28 MED ORDER — ONDANSETRON HCL 4 MG/2ML IJ SOLN
4.0000 mg | Freq: Four times a day (QID) | INTRAMUSCULAR | Status: DC | PRN
  Administered 2017-07-28: 4 mg via INTRAVENOUS
  Filled 2017-07-28: qty 2

## 2017-07-28 MED ORDER — ORAL CARE MOUTH RINSE
15.0000 mL | OROMUCOSAL | Status: DC
  Administered 2017-07-28 – 2017-08-02 (×48): 15 mL via OROMUCOSAL

## 2017-07-28 MED ORDER — NICARDIPINE HCL IN NACL 20-0.86 MG/200ML-% IV SOLN: 0.0000 mg/h | INTRAVENOUS | Status: DC

## 2017-07-28 MED ORDER — SODIUM CHLORIDE 23.4 % INJECTION (4 MEQ/ML) FOR IV ADMINISTRATION
30.0000 mL | Freq: Once | INTRAVENOUS | Status: AC
  Administered 2017-07-28: 30 mL via INTRAVENOUS
  Filled 2017-07-28: qty 30

## 2017-07-28 MED ORDER — FENTANYL CITRATE (PF) 100 MCG/2ML IJ SOLN
100.0000 ug | INTRAMUSCULAR | Status: DC | PRN
  Administered 2017-07-28: 100 ug via INTRAVENOUS
  Filled 2017-07-28 (×2): qty 2

## 2017-07-28 MED ORDER — PROPOFOL 1000 MG/100ML IV EMUL
0.0000 ug/kg/min | INTRAVENOUS | Status: DC
  Administered 2017-07-28 (×2): 50 ug/kg/min via INTRAVENOUS
  Administered 2017-07-29: 10 ug/kg/min via INTRAVENOUS
  Administered 2017-07-30 – 2017-07-31 (×3): 20 ug/kg/min via INTRAVENOUS
  Administered 2017-07-31: 30 ug/kg/min via INTRAVENOUS
  Filled 2017-07-28 (×7): qty 100

## 2017-07-28 MED ORDER — ACETAMINOPHEN 325 MG PO TABS: 650.0000 mg | ORAL_TABLET | ORAL | Status: DC | PRN

## 2017-07-28 MED ORDER — PANTOPRAZOLE SODIUM 40 MG IV SOLR
40.0000 mg | Freq: Every day | INTRAVENOUS | Status: DC
  Administered 2017-07-28 – 2017-07-29 (×2): 40 mg via INTRAVENOUS
  Filled 2017-07-28 (×3): qty 40

## 2017-07-28 MED ORDER — SODIUM CHLORIDE 3 % IV SOLN
INTRAVENOUS | Status: DC
  Administered 2017-07-28 – 2017-07-31 (×8): 50 mL/h via INTRAVENOUS
  Administered 2017-08-01 – 2017-08-04 (×4): 30 mL/h via INTRAVENOUS
  Filled 2017-07-28 (×30): qty 500

## 2017-07-28 MED ORDER — SODIUM CHLORIDE 0.9 % IV SOLN: INTRAVENOUS | Status: DC

## 2017-07-28 MED ORDER — CLEVIDIPINE BUTYRATE 0.5 MG/ML IV EMUL: 0.0000 mg/h | INTRAVENOUS | Status: DC

## 2017-07-28 NOTE — Progress Notes (Signed)
PULMONARY / CRITICAL CARE MEDICINE   Name: Jeremy [name] MRN: [id number] DOB: [birth year]    ADMISSION DATE:  07/27/2017 CONSULTATION DATE:  07/27/2017   REFERRING MD:  Dr. Wilford Corner  CHIEF COMPLAINT:  Vent management post stroke  HISTORY OF PRESENT ILLNESS:   42 y/o male with OSA, HTN was admitted on 1/11 with abrupt onset R facial droop.  He was found to have evidence of a left MCA stroke. He was given IV TP and went to IR for thrombectomy.    PAST MEDICAL HISTORY :  He  has a past medical history of Anxiety, Asthma, Depression, Deviated septum, Dyspnea, Hypertension, and Sleep apnea.   SUBJECTIVE:  Admitted overnight, acute MCA stroke, given IV TPA, then thrombectomy  VITAL SIGNS: BP 140/90   Pulse 91   Temp 99.3 F (37.4 C) (Axillary)   Resp 19   Ht 5\' 10"  (1.778 m)   Wt 86.2 kg (190 lb 0.6 oz)   SpO2 100%   BMI 27.27 kg/m   HEMODYNAMICS:    VENTILATOR SETTINGS: Vent Mode: PRVC FiO2 (%):  [40 %-100 %] 40 % Set Rate:  [14 bmp] 14 bmp Vt Set:  [580 mL] 580 mL PEEP:  [5 cmH20] 5 cmH20 Plateau Pressure:  [12 cmH20-14 cmH20] 13 cmH20  INTAKE / OUTPUT: I/O last 3 completed shifts: In: 930.2 [I.V.:622.2; Blood:308] Out: 760 [Urine:660; Blood:100]  PHYSICAL EXAMINATION:  General:  In bed on vent HENT: NCAT ETT in place PULM: CTA B, vent supported breathing CV: RRR, no mgr GI: BS+, soft, nontender MSK: normal bulk and tone Neuro: sedated on vent    LABS:  BMET Recent Labs  Lab 07/27/17 2055 07/27/17 2103 07/28/17 0259  NA 136 138 136  K 4.1 4.1 3.9  CL 99* 99* 105  CO2 26  --  24  BUN 22* 25* 18  CREATININE 1.29* 1.30* 0.95  GLUCOSE 129* 125* 123*    Electrolytes Recent Labs  Lab 07/27/17 2055 07/28/17 0259  CALCIUM 9.2 8.2*  MG  --  2.2  PHOS  --  3.2    CBC Recent Labs  Lab 07/27/17 2055 07/27/17 2103 07/28/17 0259  WBC 6.6  --  8.0  HGB 16.1 16.3 13.2  HCT 45.5 48.0 37.9*  PLT 228  --  203    Coag's Recent Labs  Lab  07/27/17 2055  APTT 25  INR 0.98    Sepsis Markers No results for input(s): LATICACIDVEN, PROCALCITON, O2SATVEN in the last 168 hours.  ABG Recent Labs  Lab 07/28/17 0137  PHART 7.341*  PCO2ART 43.2  PO2ART 170.0*    Liver Enzymes Recent Labs  Lab 07/27/17 2055  AST 48*  ALT 98*  ALKPHOS 40  BILITOT 1.1  ALBUMIN 4.2    Cardiac Enzymes No results for input(s): TROPONINI, PROBNP in the last 168 hours.  Glucose No results for input(s): GLUCAP in the last 168 hours.  Imaging Ct Angio Head W Or Wo Contrast  Addendum Date: 07/27/2017   ADDENDUM REPORT: 07/27/2017 21:47 CONTRAST:  Total of 200 cc Omnipaque 370 administered. Electronically Signed   By: Mitzi Hansen M.D.   On: 07/27/2017 21:47   Result Date: 07/27/2017 CLINICAL DATA:  42 y/o  M; right-sided numbness, acute stroke. EXAM: CT ANGIOGRAPHY HEAD AND NECK CT PERFUSION BRAIN TECHNIQUE: Multidetector CT imaging of the head and neck was performed using the standard protocol during bolus administration of intravenous contrast. Multiplanar CT image reconstructions and MIPs were obtained to evaluate the vascular anatomy. Carotid  stenosis measurements (when applicable) are obtained utilizing NASCET criteria, using the distal internal carotid diameter as the denominator. Multiphase CT imaging of the brain was performed following IV bolus contrast injection. Subsequent parametric perfusion maps were calculated using RAPID software. CONTRAST:  See addendum. COMPARISON:  07/27/2016 CT head FINDINGS: CTA NECK FINDINGS Aortic arch: Standard branching. Imaged portion shows no evidence of aneurysm or dissection. No significant stenosis of the major arch vessel origins. Right carotid system: No evidence of dissection, stenosis (50% or greater) or occlusion. Left carotid system: No evidence of dissection, stenosis (50% or greater) or occlusion. Vertebral arteries: Right dominant. No evidence of dissection, stenosis (50% or  greater) or occlusion. Skeleton: Negative. Other neck: Nodules in bilateral lobes of thyroid measuring up to 2.6 cm on the right (series 7 image 149). Upper chest: Negative. Review of the MIP images confirms the above findings CTA HEAD FINDINGS Anterior circulation: Left proximal M1 occlusion with intermediate left MCA distribution collateralization. Otherwise no significant stenosis, proximal occlusion, aneurysm, or vascular malformation in the anterior circulation. Posterior circulation: No significant stenosis, proximal occlusion, aneurysm, or vascular malformation. Venous sinuses: As permitted by contrast timing, patent. Anatomic variants: Bilateral fetal PCA. Small anterior communicating artery. Review of the MIP images confirms the above findings CT Brain Perfusion Findings: CBF (<30%) Volume: 82mL Perfusion (Tmax>6.0s) volume: Mismatch Volume: 75mL Infarction Location:Left MCA distribution IMPRESSION: CTA head: 1. Left M1 occlusion with intermediate left MCA distribution collateralization. 2. Otherwise patent anterior and posterior intracranial circulation. No additional large vessel occlusion, aneurysm, or significant stenosis is identified. CTA neck: 1. Patent carotid and vertebral arteries. No dissection, aneurysm, or significant stenosis by NASCET criteria. 2. Nodules in bilateral lobes of thyroid measuring up to 2.6 cm on the right. Thyroid ultrasound is recommended on a nonemergent basis. CT perfusion head: By automated RAPID quantification: Infarct core 82 cc, ischemic penumbra 75 cc, infarct location in left MCA distribution. These results were called by telephone at the time of interpretation on 07/27/2017 at 9:23 pm to Dr. Milon Dikes , who verbally acknowledged these results. Electronically Signed: By: Mitzi Hansen M.D. On: 07/27/2017 21:39   Ct Head Wo Contrast  Result Date: 07/28/2017 CLINICAL DATA:  Left MCA territory infarct. Vascular perforation during attempted  revascularization. EXAM: CT HEAD WITHOUT CONTRAST TECHNIQUE: Contiguous axial images were obtained from the base of the skull through the vertex without intravenous contrast. COMPARISON:  CT head without contrast from the same day at 3:27 a.m. FINDINGS: Brain: A ball vein large left MCA territory infarct is again noted. There is progressive hypoattenuation involving the left caudate head and lentiform nucleus. Extensive distal left frontal lobe territory involves the cingulate gyrus. Super ganglionic MCA territory infarct has progressed. There is significant involvement of the left temporal tip. Contrast extravasation into the sylvian fissure and interpeduncular notch is again noted. No new hemorrhage is present. Brainstem and cerebellum are normal. Midline shift measures 2 mm, without significant change. Vascular: Hyperdense left MCA compatible with occlusion. Skull: Calvarium is intact. No focal lytic or blastic lesions are present. Sinuses/Orbits: Fluid levels are present in the left maxillary sinus and left sphenoid sinus. There is some fluid in left ethmoid air cells. The remaining paranasal sinuses and mastoid air cells are clear. Globes and orbits are within normal limits. IMPRESSION: 1. Progressive left MCA territory infarct with further involvement of the left caudate head and lentiform nucleus. 2. Extensive nonhemorrhagic infarcts involving the left temporal tip, left frontal operculum, left parietal lobe, and medial left frontal  lobe, the distal left ACA territory. 3. Contrast extravasation compatible with perforation into the left sylvian fissure, the sulci over the convexity, and the interpeduncular notch. 4. Minimal midline shift. 5. Fluid in the sinuses likely secondary to intubation. Electronically Signed   By: Marin Roberts M.D.   On: 07/28/2017 10:55   Ct Head Wo Contrast  Result Date: 07/28/2017 CLINICAL DATA:  Follow-up suspected LEFT middle cerebral artery extravasation during  incomplete thrombectomy. EXAM: CT HEAD WITHOUT CONTRAST TECHNIQUE: Contiguous axial images were obtained from the base of the skull through the vertex without intravenous contrast. COMPARISON:  Multiple CT HEAD May 27, 2018 FINDINGS: BRAIN: Increased density LEFT MCA distribution extending into LEFT insula and LEFT frontoparietal sulci measuring to 91 Hounsfield units. Faint density interpeduncular cistern measuring 50 Hounsfield units. Confluent LEFT frontotemporal parietal hypodensity extending to LEFT basal ganglia. No convincing evidence of intraparenchymal hemorrhage. Regional LEFT cerebrum mass effect without midline shift. No hydrocephalus. Basal cisterns are patent. 1 cm cyst in caudal aspect splenium of corpus callosum versus pineal cyst, less likely. VASCULAR: Dense LEFT MCA (55 Hounsfield units). New density along anterior cerebral artery distribution. SKULL/SOFT TISSUES: No skull fracture. No significant soft tissue swelling. ORBITS/SINUSES: The included ocular globes and orbital contents are normal.Mild paranasal sinusitis. Life-support lines in place. Mastoid air cells are well aerated. OTHER: None. IMPRESSION: 1. Evolving acute large LEFT MCA territory infarct without hemorrhagic conversion. 2. Postprocedural extra-axial contrast extravasation in addition to probable contrast staining and small volume subarachnoid hemorrhage. 3. New density anterior cerebral artery concerning for thromboembolism. Critical Value/emergent results were called by telephone at the time of interpretation on 07/28/2017 at 4:00 am to Dr. Milon Dikes , who verbally acknowledged these results. Electronically Signed   By: Awilda Metro M.D.   On: 07/28/2017 04:16   Ct Angio Neck W Or Wo Contrast  Addendum Date: 07/27/2017   ADDENDUM REPORT: 07/27/2017 21:47 CONTRAST:  Total of 200 cc Omnipaque 370 administered. Electronically Signed   By: Mitzi Hansen M.D.   On: 07/27/2017 21:47   Result Date:  07/27/2017 CLINICAL DATA:  42 y/o  M; right-sided numbness, acute stroke. EXAM: CT ANGIOGRAPHY HEAD AND NECK CT PERFUSION BRAIN TECHNIQUE: Multidetector CT imaging of the head and neck was performed using the standard protocol during bolus administration of intravenous contrast. Multiplanar CT image reconstructions and MIPs were obtained to evaluate the vascular anatomy. Carotid stenosis measurements (when applicable) are obtained utilizing NASCET criteria, using the distal internal carotid diameter as the denominator. Multiphase CT imaging of the brain was performed following IV bolus contrast injection. Subsequent parametric perfusion maps were calculated using RAPID software. CONTRAST:  See addendum. COMPARISON:  07/27/2016 CT head FINDINGS: CTA NECK FINDINGS Aortic arch: Standard branching. Imaged portion shows no evidence of aneurysm or dissection. No significant stenosis of the major arch vessel origins. Right carotid system: No evidence of dissection, stenosis (50% or greater) or occlusion. Left carotid system: No evidence of dissection, stenosis (50% or greater) or occlusion. Vertebral arteries: Right dominant. No evidence of dissection, stenosis (50% or greater) or occlusion. Skeleton: Negative. Other neck: Nodules in bilateral lobes of thyroid measuring up to 2.6 cm on the right (series 7 image 149). Upper chest: Negative. Review of the MIP images confirms the above findings CTA HEAD FINDINGS Anterior circulation: Left proximal M1 occlusion with intermediate left MCA distribution collateralization. Otherwise no significant stenosis, proximal occlusion, aneurysm, or vascular malformation in the anterior circulation. Posterior circulation: No significant stenosis, proximal occlusion, aneurysm, or vascular malformation. Venous  sinuses: As permitted by contrast timing, patent. Anatomic variants: Bilateral fetal PCA. Small anterior communicating artery. Review of the MIP images confirms the above findings CT  Brain Perfusion Findings: CBF (<30%) Volume: 82mL Perfusion (Tmax>6.0s) volume: 157mL Mismatch Volume: 75mL Infarction Location:Left MCA distribution IMPRESSION: CTA head: 1. Left M1 occlusion with intermediate left MCA distribution collateralization. 2. Otherwise patent anterior and posterior intracranial circulation. No additional large vessel occlusion, aneurysm, or significant stenosis is identified. CTA neck: 1. Patent carotid and vertebral arteries. No dissection, aneurysm, or significant stenosis by NASCET criteria. 2. Nodules in bilateral lobes of thyroid measuring up to 2.6 cm on the right. Thyroid ultrasound is recommended on a nonemergent basis. CT perfusion head: By automated RAPID quantification: Infarct core 82 cc, ischemic penumbra 75 cc, infarct location in left MCA distribution. These results were called by telephone at the time of interpretation on 07/27/2017 at 9:23 pm to Dr. Milon DikesASHISH ARORA , who verbally acknowledged these results. Electronically Signed: By: Mitzi HansenLance  Furusawa-Stratton M.D. On: 07/27/2017 21:39   Ct Cerebral Perfusion W Contrast  Addendum Date: 07/27/2017   ADDENDUM REPORT: 07/27/2017 21:47 CONTRAST:  Total of 200 cc Omnipaque 370 administered. Electronically Signed   By: Mitzi HansenLance  Furusawa-Stratton M.D.   On: 07/27/2017 21:47   Result Date: 07/27/2017 CLINICAL DATA:  42 y/o  M; right-sided numbness, acute stroke. EXAM: CT ANGIOGRAPHY HEAD AND NECK CT PERFUSION BRAIN TECHNIQUE: Multidetector CT imaging of the head and neck was performed using the standard protocol during bolus administration of intravenous contrast. Multiplanar CT image reconstructions and MIPs were obtained to evaluate the vascular anatomy. Carotid stenosis measurements (when applicable) are obtained utilizing NASCET criteria, using the distal internal carotid diameter as the denominator. Multiphase CT imaging of the brain was performed following IV bolus contrast injection. Subsequent parametric perfusion maps  were calculated using RAPID software. CONTRAST:  See addendum. COMPARISON:  07/27/2016 CT head FINDINGS: CTA NECK FINDINGS Aortic arch: Standard branching. Imaged portion shows no evidence of aneurysm or dissection. No significant stenosis of the major arch vessel origins. Right carotid system: No evidence of dissection, stenosis (50% or greater) or occlusion. Left carotid system: No evidence of dissection, stenosis (50% or greater) or occlusion. Vertebral arteries: Right dominant. No evidence of dissection, stenosis (50% or greater) or occlusion. Skeleton: Negative. Other neck: Nodules in bilateral lobes of thyroid measuring up to 2.6 cm on the right (series 7 image 149). Upper chest: Negative. Review of the MIP images confirms the above findings CTA HEAD FINDINGS Anterior circulation: Left proximal M1 occlusion with intermediate left MCA distribution collateralization. Otherwise no significant stenosis, proximal occlusion, aneurysm, or vascular malformation in the anterior circulation. Posterior circulation: No significant stenosis, proximal occlusion, aneurysm, or vascular malformation. Venous sinuses: As permitted by contrast timing, patent. Anatomic variants: Bilateral fetal PCA. Small anterior communicating artery. Review of the MIP images confirms the above findings CT Brain Perfusion Findings: CBF (<30%) Volume: 82mL Perfusion (Tmax>6.0s) volume: 157mL Mismatch Volume: 75mL Infarction Location:Left MCA distribution IMPRESSION: CTA head: 1. Left M1 occlusion with intermediate left MCA distribution collateralization. 2. Otherwise patent anterior and posterior intracranial circulation. No additional large vessel occlusion, aneurysm, or significant stenosis is identified. CTA neck: 1. Patent carotid and vertebral arteries. No dissection, aneurysm, or significant stenosis by NASCET criteria. 2. Nodules in bilateral lobes of thyroid measuring up to 2.6 cm on the right. Thyroid ultrasound is recommended on a  nonemergent basis. CT perfusion head: By automated RAPID quantification: Infarct core 82 cc, ischemic penumbra 75 cc, infarct location in  left MCA distribution. These results were called by telephone at the time of interpretation on 07/27/2017 at 9:23 pm to Dr. Milon Dikes , who verbally acknowledged these results. Electronically Signed: By: Mitzi Hansen M.D. On: 07/27/2017 21:39   Dg Chest Port 1 View  Result Date: 07/28/2017 CLINICAL DATA:  42 year old male status post intubation. EXAM: PORTABLE CHEST 1 VIEW COMPARISON:  None. FINDINGS: An enteric tube is seen with side-port in the region of the gastroesophageal junction and tip in the proximal stomach. Recommend further advancing of the tube into the stomach by approximately 3-4 cm. A partially visualized structure with tip at the level of the T1 may represent the endotracheal tube. Clinical correlation is recommended. If an endotracheal tube is present recommend further advancing the tube by approximately 3 cm for optimal positioning. The lungs are clear. There is no pleural effusion or pneumothorax. The cardiac silhouette is within normal limits. No acute osseous pathology. IMPRESSION: 1. Enteric tube with tip in the stomach and side-port in the region of the gastroesophageal junction. Recommend advancing the tube further into the stomach for optimal positioning. A partially visualized density over the T1 may represent the tip of the endotracheal tube. If an endotracheal tube is present recommend further advancing by approximately 3 cm for optimal positioning. 2. No acute cardiopulmonary process. Electronically Signed   By: Elgie Collard M.D.   On: 07/28/2017 01:18   Ct Head Code Stroke Wo Contrast  Result Date: 07/27/2017 CLINICAL DATA:  Code stroke. 42 y/o M; right-sided weakness and facial droop. EXAM: CT HEAD WITHOUT CONTRAST TECHNIQUE: Contiguous axial images were obtained from the base of the skull through the vertex without  intravenous contrast. COMPARISON:  None. FINDINGS: Brain: Faint lucency involving left insula and frontal operculum. No significant mass effect or hemorrhage at this time. No hydrocephalus, basilar cistern effacement, or extra-axial collection. Vascular: Dense left M1. Skull: Normal. Negative for fracture or focal lesion. Sinuses/Orbits: No acute finding. Other: None. ASPECTS [facility] Stroke Program Early CT Score) - Ganglionic level infarction (caudate, lentiform nuclei, internal capsule, insula, M1-M3 cortex): 5 - Supraganglionic infarction (M4-M6 cortex): 3 Total score (0-10 with 10 being normal): 8 IMPRESSION: 1. Left insula and frontal operculum lucency compatible with infarction. No hemorrhage or mass effect. 2. Left M1 hyperdensity, likely thrombus. 3. ASPECTS is 8 These results were called by telephone at the time of interpretation on 07/27/2017 at 9:10 pm to Dr. Wilford Corner , who verbally acknowledged these results. Electronically Signed   By: Mitzi Hansen M.D.   On: 07/27/2017 21:12     STUDIES:  1/12 CT head :  CT head : left MCA distribution infarct, some extravasation of contrast in sylvian fissure, small volume subarachnoid hemorrhage, fluid in sinuses 1/11 CT angio neck/head> L M1 MCA occlusion 1/11 CT head L MCA sign  CULTURES:   ANTIBIOTICS:   SIGNIFICANT EVENTS: 1/11 admission, IV TPA, IR Thrombectomy  LINES/TUBES: 1/11 ETT >    DISCUSSION: 42 yo male with an acute L MCA stroke, had IR thrombectomy post TPA. Some small subarachnoid bleeding and extravasation post thromobectomy.  CT imaging from 1/12 shows evolving signs of MCA stroke.   ASSESSMENT / PLAN:  PULMONARY A: Acute respiratory failure with hypoxemia OSA P:   Full mechanical vent support VAP prevention Daily WUA/SBT  CARDIOVASCULAR A:  Acute MCA stroke> embolic? P:  Tele Monitor hemodynamics  RENAL A:   No acute issues P:   Monitor BMET and UOP Replace electrolytes as  needed   GASTROINTESTINAL  A:   No acute issues P:   Start tube feeding Pantoprazole for stress ulcer prophylaxis   HEMATOLOGIC A:   No acute issues P:  Monitor for bleeding  INFECTIOUS A:   No acute issues P:   Monitor for fever  ENDOCRINE A:   No acute issues P:   Glucose goals per stroke orderset  NEUROLOGIC A:   Acute L MCA stroke Vessel perforation post thrombectomy At risk for brain edema P:   RASS goal: 0 to -1 Fentanyl prn Propofol gtt per PAD protocol Post stroke care per Neurology, orderset per neurology Will place central line for hypertonic saline  FAMILY  - Updates: mother updated by me  - Inter-disciplinary family meet or Palliative Care meeting due by:  day 7  My cc time 40 minutes  [place], MD [place] [facility] Pager: [phone] Cell: [phone] After 3pm or if no response, call [phone]   Pulmonary and Critical Care Medicine [facility] Pager: [phone]  07/28/2017, 1:15 PM

## 2017-07-28 NOTE — Transfer of Care (Signed)
Immediate Anesthesia Transfer of Care Note  Patient: Jeremy [name]  Procedure(s) Performed: RADIOLOGY WITH ANESTHESIA (N/A )  Patient Location: ICU  Anesthesia Type:General  Level of Consciousness: Patient remains intubated per anesthesia plan  Airway & Oxygen Therapy: Patient remains intubated per anesthesia plan and Patient placed on Ventilator (see vital sign flow sheet for setting)  Post-op Assessment: Report given to RN and Post -op Vital signs reviewed and stable  Post vital signs: Reviewed and stable  Last Vitals:  Vitals:   07/27/17 2110 07/27/17 2137  BP: (!) 145/93   Pulse: 87   Resp: 16   SpO2: 99% 98%    Last Pain:  Vitals:   07/27/17 2105  PainSc: 0-No pain         Complications: No apparent anesthesia complications

## 2017-07-28 NOTE — Procedures (Signed)
Central Venous Catheter Insertion Procedure Note Jeremy [name] [id number] August 29, 1975  Procedure: Insertion of Central Venous Catheter Indications: Assessment of intravascular volume, Drug and/or fluid administration and Frequent blood sampling  Procedure Details Consent: Risks of procedure as well as the alternatives and risks of each were explained to the (patient/caregiver).  Consent for procedure obtained. Time Out: Verified patient identification, verified procedure, site/side was marked, verified correct patient position, special equipment/implants available, medications/allergies/relevent history reviewed, required imaging and test results available.  Performed  Maximum sterile technique was used including antiseptics, cap, gloves, gown, hand hygiene, mask and sheet. Skin prep: Chlorhexidine; local anesthetic administered A antimicrobial bonded/coated triple lumen catheter was placed in the right subclavian vein using the Seldinger technique.  Evaluation Blood flow good Complications: No apparent complications Patient did tolerate procedure well. Chest X-ray ordered to verify placement.  CXR: pending.  U/S used in placement  Jeremy [name] 07/28/2017, 2:47 PM

## 2017-07-28 NOTE — Progress Notes (Signed)
Pt transported from 4N 31 to CT and back without any complications.

## 2017-07-28 NOTE — CV Procedure (Signed)
Attempted 2D Echo, Patient was leaving to CT as I walked in room, will attempt at a later time.  Jeremy [name]

## 2017-07-28 NOTE — Progress Notes (Signed)
OVERNIGHT PROGRESS NOTE  Repeat CTH done. Report below: 1. Evolving acute large LEFT MCA territory infarct without hemorrhagic conversion. 2. Postprocedural extra-axial contrast extravasation in addition to probable contrast staining and small volume subarachnoid hemorrhage. 3. New density anterior cerebral artery concerning for Thromboembolism.  Family (wife and 3 Aunts) updated on the endovascular procedure, result of the CTH done during procedure and new CTH just done that shows evolving left MCA stroke as well as possibly hyperdensity in the ACA concerning for new thromboembolism. I reviewed the images with them on a workstation as well.  He remains in critical condition and at risk for malignant LMCA syndrome.  New information since last seen is that he is left handed. That might explain why he was not aphasic with the presence of a Left MCA thrombus.  We will continue to closely monitor him in NICU.   Repeat CTH at 0800.  At that time, consider NSGY consult for decompressive hemicraniectomy if deemed appropriate.  Also consider hyperosmolar therapy.  -- Milon DikesAshish Kayvon Mo, MD [facility] Pager: [phone] If 7pm to 7am, please call on call as listed on AMION.

## 2017-07-28 NOTE — Progress Notes (Signed)
STROKE TEAM PROGRESS NOTE   SUBJECTIVE (INTERVAL HISTORY) His wife and mom and other family members are at the bedside.  Pt still intubated but not on sedation.  Patient was able to open eyes and follow commands.  Still has left gaze and right-sided hemiplegia.  However, language function may be preserved as patient is left-handed.  Repeat CT shows stable SAH and midline shift.  Discussed with Dr. Kathyrn Sheriff and will hold off hemicrania at this time, but will repeat CT in a.m. and also watch MRI tonight.  As per wife, he had a history of hypertension and OSA, no other medical problems.  FAMILY HISTORY History reviewed. No pertinent family history.  SOCIAL HISTORY  reports that  has never smoked. he has never used smokeless tobacco. He reports that he drinks alcohol. He reports that he does not use drugs.   HOME MEDICATIONS:  No outpatient medications have been marked as taking for the 07/27/17 encounter [facility] Encounter).      HOSPITAL MEDICATIONS:  . chlorhexidine gluconate (MEDLINE KIT)  15 mL Mouth Rinse BID  . mouth rinse  15 mL Mouth Rinse 10 times per day  . nitroGLYCERIN      . pantoprazole (PROTONIX) IV  40 mg Intravenous QHS    OBJECTIVE Temp:  [97.2 F (36.2 C)-97.6 F (36.4 C)] 97.3 F (36.3 C) (01/12 0145) Pulse Rate:  [72-98] 72 (01/12 0700) Cardiac Rhythm: Normal sinus rhythm (01/12 0030) Resp:  [12-18] 14 (01/12 0700) BP: (110-145)/(73-113) 127/92 (01/12 0700) SpO2:  [98 %-100 %] 100 % (01/12 0700) Arterial Line BP: (97-151)/(62-108) 124/108 (01/12 0700) FiO2 (%):  [40 %-100 %] 40 % (01/12 0340) Weight:  [190 lb 0.6 oz (86.2 kg)-198 lb 9.6 oz (90.1 kg)] 190 lb 0.6 oz (86.2 kg) (01/12 0545)  CBC:  Recent Labs  Lab 07/27/17 2055 07/27/17 2103 07/28/17 0259  WBC 6.6  --  8.0  NEUTROABS 3.2  --  6.3  HGB 16.1 16.3 13.2  HCT 45.5 48.0 37.9*  MCV 88.0  --  87.5  PLT 228  --  536    Basic Metabolic Panel:  Recent Labs  Lab 07/27/17 2055 07/27/17 2103  07/28/17 0259  NA 136 138 136  K 4.1 4.1 3.9  CL 99* 99* 105  CO2 26  --  24  GLUCOSE 129* 125* 123*  BUN 22* 25* 18  CREATININE 1.29* 1.30* 0.95  CALCIUM 9.2  --  8.2*  MG  --   --  2.2  PHOS  --   --  3.2    Lipid Panel:     Component Value Date/Time   CHOL 218 (H) 07/28/2017 0259   TRIG 417 (H) 07/28/2017 0259   TRIG 428 (H) 07/28/2017 0259   HDL 28 (L) 07/28/2017 0259   CHOLHDL 7.8 07/28/2017 0259   VLDL UNABLE TO CALCULATE IF TRIGLYCERIDE OVER 400 mg/dL 07/28/2017 0259   LDLCALC UNABLE TO CALCULATE IF TRIGLYCERIDE OVER 400 mg/dL 07/28/2017 0259   HgbA1c:  Lab Results  Component Value Date   HGBA1C 5.2 07/28/2017   Urine Drug Screen:     Component Value Date/Time   LABOPIA NONE DETECTED 07/28/2017 0415   COCAINSCRNUR NONE DETECTED 07/28/2017 0415   LABBENZ NONE DETECTED 07/28/2017 0415   AMPHETMU NONE DETECTED 07/28/2017 [place] DETECTED 07/28/2017 0415   LABBARB NONE DETECTED 07/28/2017 0415    Alcohol Level No results found for: [place] I have personally reviewed the radiological images below and agree with  the radiology interpretations.  Ct Angio Head W Or Wo Contrast  Addendum Date: 07/27/2017   ADDENDUM REPORT: 07/27/2017 21:47 CONTRAST:  Total of 200 cc Omnipaque 370 administered. Electronically Signed   By: Kristine Garbe M.D.   On: 07/27/2017 21:47   Result Date: 07/27/2017 CLINICAL DATA:  42 y/o  M; right-sided numbness, acute stroke. EXAM: CT ANGIOGRAPHY HEAD AND NECK CT PERFUSION BRAIN TECHNIQUE: Multidetector CT imaging of the head and neck was performed using the standard protocol during bolus administration of intravenous contrast. Multiplanar CT image reconstructions and MIPs were obtained to evaluate the vascular anatomy. Carotid stenosis measurements (when applicable) are obtained utilizing NASCET criteria, using the distal internal carotid diameter as the denominator. Multiphase CT imaging of the brain was performed  following IV bolus contrast injection. Subsequent parametric perfusion maps were calculated using RAPID software. CONTRAST:  See addendum. COMPARISON:  07/27/2016 CT head FINDINGS: CTA NECK FINDINGS Aortic arch: Standard branching. Imaged portion shows no evidence of aneurysm or dissection. No significant stenosis of the major arch vessel origins. Right carotid system: No evidence of dissection, stenosis (50% or greater) or occlusion. Left carotid system: No evidence of dissection, stenosis (50% or greater) or occlusion. Vertebral arteries: Right dominant. No evidence of dissection, stenosis (50% or greater) or occlusion. Skeleton: Negative. Other neck: Nodules in bilateral lobes of thyroid measuring up to 2.6 cm on the right (series 7 image 149). Upper chest: Negative. Review of the MIP images confirms the above findings CTA HEAD FINDINGS Anterior circulation: Left proximal M1 occlusion with intermediate left MCA distribution collateralization. Otherwise no significant stenosis, proximal occlusion, aneurysm, or vascular malformation in the anterior circulation. Posterior circulation: No significant stenosis, proximal occlusion, aneurysm, or vascular malformation. Venous sinuses: As permitted by contrast timing, patent. Anatomic variants: Bilateral fetal PCA. Small anterior communicating artery. Review of the MIP images confirms the above findings CT Brain Perfusion Findings: CBF (<30%) Volume: 67m Perfusion (Tmax>6.0s) volume: 1556mMismatch Volume: 7566mnfarction Location:Left MCA distribution IMPRESSION: CTA head: 1. Left M1 occlusion with intermediate left MCA distribution collateralization. 2. Otherwise patent anterior and posterior intracranial circulation. No additional large vessel occlusion, aneurysm, or significant stenosis is identified. CTA neck: 1. Patent carotid and vertebral arteries. No dissection, aneurysm, or significant stenosis by NASCET criteria. 2. Nodules in bilateral lobes of thyroid  measuring up to 2.6 cm on the right. Thyroid ultrasound is recommended on a nonemergent basis. CT perfusion head: By automated RAPID quantification: Infarct core 82 cc, ischemic penumbra 75 cc, infarct location in left MCA distribution. These results were called by telephone at the time of interpretation on 07/27/2017 at 9:23 pm to Dr. ASHAmie Portlandwho verbally acknowledged these results. Electronically Signed: By: LanKristine GarbeD. On: 07/27/2017 21:39   Ct Head Wo Contrast  Result Date: 07/28/2017 CLINICAL DATA:  Follow-up suspected LEFT middle cerebral artery extravasation during incomplete thrombectomy. EXAM: CT HEAD WITHOUT CONTRAST TECHNIQUE: Contiguous axial images were obtained from the base of the skull through the vertex without intravenous contrast. COMPARISON:  Multiple CT HEAD May 27, 2018 FINDINGS: BRAIN: Increased density LEFT MCA distribution extending into LEFT insula and LEFT frontoparietal sulci measuring to 91 Hounsfield units. Faint density interpeduncular cistern measuring 50 Hounsfield units. Confluent LEFT frontotemporal parietal hypodensity extending to LEFT basal ganglia. No convincing evidence of intraparenchymal hemorrhage. Regional LEFT cerebrum mass effect without midline shift. No hydrocephalus. Basal cisterns are patent. 1 cm cyst in caudal aspect splenium of corpus callosum versus pineal cyst, less likely. VASCULAR: Dense LEFT MCA (  55 Hounsfield units). New density along anterior cerebral artery distribution. SKULL/SOFT TISSUES: No skull fracture. No significant soft tissue swelling. ORBITS/SINUSES: The included ocular globes and orbital contents are normal.Mild paranasal sinusitis. Life-support lines in place. Mastoid air cells are well aerated. OTHER: None. IMPRESSION: 1. Evolving acute large LEFT MCA territory infarct without hemorrhagic conversion. 2. Postprocedural extra-axial contrast extravasation in addition to probable contrast staining and small volume  subarachnoid hemorrhage. 3. New density anterior cerebral artery concerning for thromboembolism. Critical Value/emergent results were called by telephone at the time of interpretation on 07/28/2017 at 4:00 am to Dr. Amie Portland , who verbally acknowledged these results. Electronically Signed   By: Elon Alas M.D.   On: 07/28/2017 04:16   Ct Angio Neck W Or Wo Contrast  Addendum Date: 07/27/2017   ADDENDUM REPORT: 07/27/2017 21:47 CONTRAST:  Total of 200 cc Omnipaque 370 administered. Electronically Signed   By: Kristine Garbe M.D.   On: 07/27/2017 21:47   Result Date: 07/27/2017 CLINICAL DATA:  42 y/o  M; right-sided numbness, acute stroke. EXAM: CT ANGIOGRAPHY HEAD AND NECK CT PERFUSION BRAIN TECHNIQUE: Multidetector CT imaging of the head and neck was performed using the standard protocol during bolus administration of intravenous contrast. Multiplanar CT image reconstructions and MIPs were obtained to evaluate the vascular anatomy. Carotid stenosis measurements (when applicable) are obtained utilizing NASCET criteria, using the distal internal carotid diameter as the denominator. Multiphase CT imaging of the brain was performed following IV bolus contrast injection. Subsequent parametric perfusion maps were calculated using RAPID software. CONTRAST:  See addendum. COMPARISON:  07/27/2016 CT head FINDINGS: CTA NECK FINDINGS Aortic arch: Standard branching. Imaged portion shows no evidence of aneurysm or dissection. No significant stenosis of the major arch vessel origins. Right carotid system: No evidence of dissection, stenosis (50% or greater) or occlusion. Left carotid system: No evidence of dissection, stenosis (50% or greater) or occlusion. Vertebral arteries: Right dominant. No evidence of dissection, stenosis (50% or greater) or occlusion. Skeleton: Negative. Other neck: Nodules in bilateral lobes of thyroid measuring up to 2.6 cm on the right (series 7 image 149). Upper chest:  Negative. Review of the MIP images confirms the above findings CTA HEAD FINDINGS Anterior circulation: Left proximal M1 occlusion with intermediate left MCA distribution collateralization. Otherwise no significant stenosis, proximal occlusion, aneurysm, or vascular malformation in the anterior circulation. Posterior circulation: No significant stenosis, proximal occlusion, aneurysm, or vascular malformation. Venous sinuses: As permitted by contrast timing, patent. Anatomic variants: Bilateral fetal PCA. Small anterior communicating artery. Review of the MIP images confirms the above findings CT Brain Perfusion Findings: CBF (<30%) Volume: 3m Perfusion (Tmax>6.0s) volume: 1560mMismatch Volume: 7562mnfarction Location:Left MCA distribution IMPRESSION: CTA head: 1. Left M1 occlusion with intermediate left MCA distribution collateralization. 2. Otherwise patent anterior and posterior intracranial circulation. No additional large vessel occlusion, aneurysm, or significant stenosis is identified. CTA neck: 1. Patent carotid and vertebral arteries. No dissection, aneurysm, or significant stenosis by NASCET criteria. 2. Nodules in bilateral lobes of thyroid measuring up to 2.6 cm on the right. Thyroid ultrasound is recommended on a nonemergent basis. CT perfusion head: By automated RAPID quantification: Infarct core 82 cc, ischemic penumbra 75 cc, infarct location in left MCA distribution. These results were called by telephone at the time of interpretation on 07/27/2017 at 9:23 pm to Dr. ASHAmie Portlandwho verbally acknowledged these results. Electronically Signed: By: LanKristine GarbeD. On: 07/27/2017 21:39   Ct Cerebral Perfusion W Contrast  Addendum Date: 07/27/2017  ADDENDUM REPORT: 07/27/2017 21:47 CONTRAST:  Total of 200 cc Omnipaque 370 administered. Electronically Signed   By: Kristine Garbe M.D.   On: 07/27/2017 21:47   Result Date: 07/27/2017 CLINICAL DATA:  42 y/o  M; right-sided  numbness, acute stroke. EXAM: CT ANGIOGRAPHY HEAD AND NECK CT PERFUSION BRAIN TECHNIQUE: Multidetector CT imaging of the head and neck was performed using the standard protocol during bolus administration of intravenous contrast. Multiplanar CT image reconstructions and MIPs were obtained to evaluate the vascular anatomy. Carotid stenosis measurements (when applicable) are obtained utilizing NASCET criteria, using the distal internal carotid diameter as the denominator. Multiphase CT imaging of the brain was performed following IV bolus contrast injection. Subsequent parametric perfusion maps were calculated using RAPID software. CONTRAST:  See addendum. COMPARISON:  07/27/2016 CT head FINDINGS: CTA NECK FINDINGS Aortic arch: Standard branching. Imaged portion shows no evidence of aneurysm or dissection. No significant stenosis of the major arch vessel origins. Right carotid system: No evidence of dissection, stenosis (50% or greater) or occlusion. Left carotid system: No evidence of dissection, stenosis (50% or greater) or occlusion. Vertebral arteries: Right dominant. No evidence of dissection, stenosis (50% or greater) or occlusion. Skeleton: Negative. Other neck: Nodules in bilateral lobes of thyroid measuring up to 2.6 cm on the right (series 7 image 149). Upper chest: Negative. Review of the MIP images confirms the above findings CTA HEAD FINDINGS Anterior circulation: Left proximal M1 occlusion with intermediate left MCA distribution collateralization. Otherwise no significant stenosis, proximal occlusion, aneurysm, or vascular malformation in the anterior circulation. Posterior circulation: No significant stenosis, proximal occlusion, aneurysm, or vascular malformation. Venous sinuses: As permitted by contrast timing, patent. Anatomic variants: Bilateral fetal PCA. Small anterior communicating artery. Review of the MIP images confirms the above findings CT Brain Perfusion Findings: CBF (<30%) Volume: 49m  Perfusion (Tmax>6.0s) volume: 1561mMismatch Volume: 7543mnfarction Location:Left MCA distribution IMPRESSION: CTA head: 1. Left M1 occlusion with intermediate left MCA distribution collateralization. 2. Otherwise patent anterior and posterior intracranial circulation. No additional large vessel occlusion, aneurysm, or significant stenosis is identified. CTA neck: 1. Patent carotid and vertebral arteries. No dissection, aneurysm, or significant stenosis by NASCET criteria. 2. Nodules in bilateral lobes of thyroid measuring up to 2.6 cm on the right. Thyroid ultrasound is recommended on a nonemergent basis. CT perfusion head: By automated RAPID quantification: Infarct core 82 cc, ischemic penumbra 75 cc, infarct location in left MCA distribution. These results were called by telephone at the time of interpretation on 07/27/2017 at 9:23 pm to Dr. ASHAmie Portlandwho verbally acknowledged these results. Electronically Signed: By: LanKristine GarbeD. On: 07/27/2017 21:39   Dg Chest Port 1 View  Result Date: 07/28/2017 CLINICAL DATA:  [age]ar old male status post intubation. EXAM: PORTABLE CHEST 1 VIEW COMPARISON:  None. FINDINGS: An enteric tube is seen with side-port in the region of the gastroesophageal junction and tip in the proximal stomach. Recommend further advancing of the tube into the stomach by approximately 3-4 cm. A partially visualized structure with tip at the level of the T1 may represent the endotracheal tube. Clinical correlation is recommended. If an endotracheal tube is present recommend further advancing the tube by approximately 3 cm for optimal positioning. The lungs are clear. There is no pleural effusion or pneumothorax. The cardiac silhouette is within normal limits. No acute osseous pathology. IMPRESSION: 1. Enteric tube with tip in the stomach and side-port in the region of the gastroesophageal junction. Recommend advancing the tube further into the stomach  for optimal  positioning. A partially visualized density over the T1 may represent the tip of the endotracheal tube. If an endotracheal tube is present recommend further advancing by approximately 3 cm for optimal positioning. 2. No acute cardiopulmonary process. Electronically Signed   By: Anner Crete M.D.   On: 07/28/2017 01:18   Ct Head Code Stroke Wo Contrast  Result Date: 07/27/2017 CLINICAL DATA:  Code stroke. 42 y/o M; right-sided weakness and facial droop. EXAM: CT HEAD WITHOUT CONTRAST TECHNIQUE: Contiguous axial images were obtained from the base of the skull through the vertex without intravenous contrast. COMPARISON:  None. FINDINGS: Brain: Faint lucency involving left insula and frontal operculum. No significant mass effect or hemorrhage at this time. No hydrocephalus, basilar cistern effacement, or extra-axial collection. Vascular: Dense left M1. Skull: Normal. Negative for fracture or focal lesion. Sinuses/Orbits: No acute finding. Other: None. ASPECTS [facility] Stroke Program Early CT Score) - Ganglionic level infarction (caudate, lentiform nuclei, internal capsule, insula, M1-M3 cortex): 5 - Supraganglionic infarction (M4-M6 cortex): 3 Total score (0-10 with 10 being normal): 8 IMPRESSION: 1. Left insula and frontal operculum lucency compatible with infarction. No hemorrhage or mass effect. 2. Left M1 hyperdensity, likely thrombus. 3. ASPECTS is 8 These results were called by telephone at the time of interpretation on 07/27/2017 at 9:10 pm to Dr. Rory Percy , who verbally acknowledged these results. Electronically Signed   By: Kristine Garbe M.D.   On: 07/27/2017 21:12   CT head 05/28/18 10am 1. Progressive left MCA territory infarct with further involvement of the left caudate head and lentiform nucleus. 2. Extensive nonhemorrhagic infarcts involving the left temporal tip, left frontal operculum, left parietal lobe, and medial left frontal lobe, the distal left ACA territory. 3. Contrast  extravasation compatible with perforation into the left sylvian fissure, the sulci over the convexity, and the interpeduncular notch. 4. Minimal midline shift. 5. Fluid in the sinuses likely secondary to intubation.  Transthoracic Echocardiogram - pending  Bilateral LE venous Dopplers - No evidence of deep vein thrombosis or baker's cysts bilaterally.   MRI pending   CT repeat pending   PHYSICAL EXAM Vitals:   07/28/17 0615 07/28/17 0630 07/28/17 0645 07/28/17 0700  BP:    (!) 127/92  Pulse: 83 86 76 72  Resp: _0 Temp:      TempSrc:      SpO2: 100% 100% 100% 100%  Weight:      Height:         Past Medical History:  Diagnosis Date  . Anxiety   . Asthma    MILD AS A CHILD  . Depression   . Deviated septum   . Dyspnea    NORMAL SPIROMETRY 02/01/17 VISIT WITH DR Dauphin  . Hypertension   . Sleep apnea     Temp:  [97.2 F (36.2 C)-99.3 F (37.4 C)] 99.3 F (37.4 C) (01/12 0800) Pulse Rate:  [72-98] 91 (01/12 1220) Resp:  [12-19] 19 (01/12 1220) BP: (110-145)/(73-113) 140/90 (01/12 1220) SpO2:  [98 %-100 %] 100 % (01/12 1220) Arterial Line BP: (97-151)/(62-109) 125/107 (01/12 0800) FiO2 (%):  [40 %-100 %] 40 % (01/12 1220) Weight:  [190 lb 0.6 oz (86.2 kg)-198 lb 9.6 oz (90.1 kg)] 190 lb 0.6 oz (86.2 kg) (01/12 0545)  General - Well nourished, well developed, intubated.  Ophthalmologic - Fundi not visualized due to small pupils.  Cardiovascular - Regular rate and rhythm.  Neuro -intubated, not on sedation, lethargic, able to open eyes midway,  PERRLA, left gaze preference, not able to cross midline, blinking to visual threat on the left, but not able to the left.  Able to follow all simple commands.  Facial symmetry not able to test due to ET tube.  Left upper extremity and lower extremity at least 4/5, right upper and lower extremity dense hemiplegia.  DTR 1+, no Babinski.  Sensation and coordination not cooperative.  Gait not  tested.   ASSESSMENT/PLAN [name] is a 42 y.o. male with history of HTN, OSA presenting with right-sided weakness, left gaze and right neglect. He receive IV t-PA.  However, unsuccessful for mechanical thrombectomy with complication of left SAH.  Stroke: Left large MCA infarct status post TPA with unsuccessful IR attempt causing left SAH.  Resultant  left gaze, right hemiplegia, right neglect  CT head left MCA  hyperdense  CT head and neck left MCA occlusion  DSA with unsuccessful IR attempt causing left SAH  CT repeat showed left MCA large infarct with left stable SAH and stable subtle midline shift  MRI head pending  2D Echo - pending  LE venous Doppler no DVT  Consider TEE when stable  LDL NTC due to high TG  HgbA1c - 5.2  UDS negative  VTE prophylaxis -SCDs  Diet NPO time specified  No antithrombotic prior to admission, now on No antithrombotic due to [facility]  Ongoing aggressive stroke risk factor management  Therapy recommendations:  pending  Disposition:  Pending  Cerebral edema  CT head showed large left MCA infarct  MRI pending  Put on 3% saline  Sodium goal 152 and 55  23.4% saline if needed  Neurosurgery on board -consider hemi craniectomy if needed  Hypertension  Stable  Permissive hypertension (OK if <180/105) but gradually normalize in 5-7 days  Long-term BP goal normotensive  Hyperlipidemia  Home meds: None  LDL NTC due to high TG, goal < 70  TG 417  Continue statin once p.o. access  Other Stroke Risk Factors  Obstructive sleep apnea  Other Active Problems  intubated  Hospital day # 1  This patient is critically ill due to large left MCA infarct, SAH, cerebral edema and at significant risk of neurological worsening, death form brain herniation, seizure, heart failure. This patient's care requires constant monitoring of vital signs, hemodynamics, respiratory and cardiac monitoring, review of multiple databases,  neurological assessment, discussion with family, other specialists and medical decision making of high complexity. I had long discussion with wife, mom and multiple family members at bedside, updated pt current condition, treatment plan and potential prognosis. They expressed understanding and appreciation. I spent 45 minutes of neurocritical care time in the care of this patient.  Rosalin Hawking, MD PhD Stroke Neurology 07/28/2017 11:05 PM   To contact Stroke Continuity provider, please refer to [email or web address] After hours, contact General Neurology

## 2017-07-28 NOTE — Progress Notes (Signed)
Neuro IR Rt 8 french femoral sheath removed at 1010.  VPAD and manual pressure applied to achieve hemostasis at 1030.  Distal pulses intact. No acute complications.  Site reviewed with Medical laboratory scientific officerJosh RN.  Pressure dressing applied. [facility] LLC[facility]ather Jillana Selph Kris Hines

## 2017-07-28 NOTE — Consult Note (Signed)
PULMONARY / CRITICAL CARE MEDICINE   Name: Jeremy [name] MRN: [id number] DOB: 1976-01-21    ADMISSION DATE:  07/27/2017 CONSULTATION DATE: 07/28/17  REFERRING MD: Dr Wilford CornerArora  CHIEF COMPLAINT: vent management  HISTORY OF PRESENT ILLNESS:   41yoM with hx HTN, OSA, Anxiety, and Depression, who was in normal state of health tonight at 8pm then was witnessed to fall off the bed at 8:10pm and found to have right facial droop, slurred speech, and right-sided weakness. EMS was called and brought patient to [facility] ER where Head CT found dense L-MCA ischemic CVA. Patient given IV TPA and then taken for IR Thrombectomy.   PAST MEDICAL HISTORY :  He  has a past medical history of Anxiety, Asthma, Depression, Deviated septum, Dyspnea, Hypertension, and Sleep apnea.  PAST SURGICAL HISTORY: He  has a past surgical history that includes Nasal septoplasty w/ turbinoplasty (Bilateral, 03/06/2017).  No Known Allergies  No current facility-administered medications on file prior to encounter.    Current Outpatient Medications on File Prior to Encounter  Medication Sig  . amLODipine (NORVASC) 10 MG tablet Take 10 mg by mouth at bedtime.  Marland Kitchen. KLOR-CON M20 20 MEQ tablet Take 40 mEq by mouth daily.  Marland Kitchen. oxyCODONE-acetaminophen (ROXICET) 5-325 MG tablet Take 1-2 tablets by mouth every 4 (four) hours as needed for severe pain.  Marland Kitchen. sulfamethoxazole-trimethoprim (BACTRIM DS,SEPTRA DS) 800-160 MG tablet Take 1 tablet by mouth 2 (two) times daily.  Marland Kitchen. triamterene-hydrochlorothiazide (MAXZIDE-25) 37.5-25 MG tablet Take 1 tablet by mouth daily.   FAMILY HISTORY:  His has no family status information on file.   SOCIAL HISTORY: He  reports that  has never smoked. he has never used smokeless tobacco. He reports that he drinks alcohol. He reports that he does not use drugs.  REVIEW OF SYSTEMS:   Review of Systems  Unable to perform ROS: Critical illness   SUBJECTIVE:  Intubated and sedated   VITAL SIGNS: BP (!) 145/93 (BP  Location: Right Arm)   Pulse 87   Resp 16   Ht 5\' 10"  (1.778 m)   Wt 90.1 kg (198 lb 9.6 oz)   SpO2 98%   BMI 28.50 kg/m   VENTILATOR SETTINGS:  PRVC, R 14, Vt 580, PEEP 5, FIO1 100%  INTAKE / OUTPUT: No intake/output data recorded.  PHYSICAL EXAMINATION: General: WDWN male, intubated and sedated, critically ill Neuro: deeply sedated no response to sternal rub HEENT: OP clear, MM moist, Orally intubated Cardiovascular: RRR no m/r/g Lungs: CTA b/l Abdomen:  Soft NTND, BS+ Musculoskeletal: no LE edema GU: foley catheter in place Skin: no rashes   LABS:  BMET Recent Labs  Lab 07/27/17 2055 07/27/17 2103  NA 136 138  K 4.1 4.1  CL 99* 99*  CO2 26  --   BUN 22* 25*  CREATININE 1.29* 1.30*  GLUCOSE 129* 125*   Electrolytes Recent Labs  Lab 07/27/17 2055  CALCIUM 9.2   CBC Recent Labs  Lab 07/27/17 2055 07/27/17 2103  WBC 6.6  --   HGB 16.1 16.3  HCT 45.5 48.0  PLT 228  --    Coag's Recent Labs  Lab 07/27/17 2055  APTT 25  INR 0.98   Sepsis Markers No results for input(s): LATICACIDVEN, PROCALCITON, O2SATVEN in the last 168 hours.  ABG No results for input(s): PHART, PCO2ART, PO2ART in the last 168 hours.  Liver Enzymes Recent Labs  Lab 07/27/17 2055  AST 48*  ALT 98*  ALKPHOS 40  BILITOT 1.1  ALBUMIN 4.2  Cardiac Enzymes No results for input(s): TROPONINI, PROBNP in the last 168 hours.  Glucose No results for input(s): GLUCAP in the last 168 hours.  Imaging Ct Angio Head W Or Wo Contrast  Addendum Date: 07/27/2017   ADDENDUM REPORT: 07/27/2017 21:47 CONTRAST:  Total of 200 cc Omnipaque 370 administered. Electronically Signed   By: Mitzi Hansen M.D.   On: 07/27/2017 21:47   Result Date: 07/27/2017 CLINICAL DATA:  42 y/o  M; right-sided numbness, acute stroke. EXAM: CT ANGIOGRAPHY HEAD AND NECK CT PERFUSION BRAIN TECHNIQUE: Multidetector CT imaging of the head and neck was performed using the standard protocol during  bolus administration of intravenous contrast. Multiplanar CT image reconstructions and MIPs were obtained to evaluate the vascular anatomy. Carotid stenosis measurements (when applicable) are obtained utilizing NASCET criteria, using the distal internal carotid diameter as the denominator. Multiphase CT imaging of the brain was performed following IV bolus contrast injection. Subsequent parametric perfusion maps were calculated using RAPID software. CONTRAST:  See addendum. COMPARISON:  07/27/2016 CT head FINDINGS: CTA NECK FINDINGS Aortic arch: Standard branching. Imaged portion shows no evidence of aneurysm or dissection. No significant stenosis of the major arch vessel origins. Right carotid system: No evidence of dissection, stenosis (50% or greater) or occlusion. Left carotid system: No evidence of dissection, stenosis (50% or greater) or occlusion. Vertebral arteries: Right dominant. No evidence of dissection, stenosis (50% or greater) or occlusion. Skeleton: Negative. Other neck: Nodules in bilateral lobes of thyroid measuring up to 2.6 cm on the right (series 7 image 149). Upper chest: Negative. Review of the MIP images confirms the above findings CTA HEAD FINDINGS Anterior circulation: Left proximal M1 occlusion with intermediate left MCA distribution collateralization. Otherwise no significant stenosis, proximal occlusion, aneurysm, or vascular malformation in the anterior circulation. Posterior circulation: No significant stenosis, proximal occlusion, aneurysm, or vascular malformation. Venous sinuses: As permitted by contrast timing, patent. Anatomic variants: Bilateral fetal PCA. Small anterior communicating artery. Review of the MIP images confirms the above findings CT Brain Perfusion Findings: CBF (<30%) Volume: 82mL Perfusion (Tmax>6.0s) volume: Mismatch Volume: 75mL Infarction Location:Left MCA distribution IMPRESSION: CTA head: 1. Left M1 occlusion with intermediate left MCA distribution  collateralization. 2. Otherwise patent anterior and posterior intracranial circulation. No additional large vessel occlusion, aneurysm, or significant stenosis is identified. CTA neck: 1. Patent carotid and vertebral arteries. No dissection, aneurysm, or significant stenosis by NASCET criteria. 2. Nodules in bilateral lobes of thyroid measuring up to 2.6 cm on the right. Thyroid ultrasound is recommended on a nonemergent basis. CT perfusion head: By automated RAPID quantification: Infarct core 82 cc, ischemic penumbra 75 cc, infarct location in left MCA distribution. These results were called by telephone at the time of interpretation on 07/27/2017 at 9:23 pm to Dr. Milon Dikes , who verbally acknowledged these results. Electronically Signed: By: Mitzi Hansen M.D. On: 07/27/2017 21:39   Ct Angio Neck W Or Wo Contrast  Addendum Date: 07/27/2017   ADDENDUM REPORT: 07/27/2017 21:47 CONTRAST:  Total of 200 cc Omnipaque 370 administered. Electronically Signed   By: Mitzi Hansen M.D.   On: 07/27/2017 21:47   Result Date: 07/27/2017 CLINICAL DATA:  42 y/o  M; right-sided numbness, acute stroke. EXAM: CT ANGIOGRAPHY HEAD AND NECK CT PERFUSION BRAIN TECHNIQUE: Multidetector CT imaging of the head and neck was performed using the standard protocol during bolus administration of intravenous contrast. Multiplanar CT image reconstructions and MIPs were obtained to evaluate the vascular anatomy. Carotid stenosis measurements (when applicable) are obtained  utilizing NASCET criteria, using the distal internal carotid diameter as the denominator. Multiphase CT imaging of the brain was performed following IV bolus contrast injection. Subsequent parametric perfusion maps were calculated using RAPID software. CONTRAST:  See addendum. COMPARISON:  07/27/2016 CT head FINDINGS: CTA NECK FINDINGS Aortic arch: Standard branching. Imaged portion shows no evidence of aneurysm or dissection. No significant  stenosis of the major arch vessel origins. Right carotid system: No evidence of dissection, stenosis (50% or greater) or occlusion. Left carotid system: No evidence of dissection, stenosis (50% or greater) or occlusion. Vertebral arteries: Right dominant. No evidence of dissection, stenosis (50% or greater) or occlusion. Skeleton: Negative. Other neck: Nodules in bilateral lobes of thyroid measuring up to 2.6 cm on the right (series 7 image 149). Upper chest: Negative. Review of the MIP images confirms the above findings CTA HEAD FINDINGS Anterior circulation: Left proximal M1 occlusion with intermediate left MCA distribution collateralization. Otherwise no significant stenosis, proximal occlusion, aneurysm, or vascular malformation in the anterior circulation. Posterior circulation: No significant stenosis, proximal occlusion, aneurysm, or vascular malformation. Venous sinuses: As permitted by contrast timing, patent. Anatomic variants: Bilateral fetal PCA. Small anterior communicating artery. Review of the MIP images confirms the above findings CT Brain Perfusion Findings: CBF (<30%) Volume: 82mL Perfusion (Tmax>6.0s) volume: Mismatch Volume: 75mL Infarction Location:Left MCA distribution IMPRESSION: CTA head: 1. Left M1 occlusion with intermediate left MCA distribution collateralization. 2. Otherwise patent anterior and posterior intracranial circulation. No additional large vessel occlusion, aneurysm, or significant stenosis is identified. CTA neck: 1. Patent carotid and vertebral arteries. No dissection, aneurysm, or significant stenosis by NASCET criteria. 2. Nodules in bilateral lobes of thyroid measuring up to 2.6 cm on the right. Thyroid ultrasound is recommended on a nonemergent basis. CT perfusion head: By automated RAPID quantification: Infarct core 82 cc, ischemic penumbra 75 cc, infarct location in left MCA distribution. These results were called by telephone at the time of interpretation on  07/27/2017 at 9:23 pm to Dr. Milon Dikes , who verbally acknowledged these results. Electronically Signed: By: Mitzi Hansen M.D. On: 07/27/2017 21:39   Ct Cerebral Perfusion W Contrast  Addendum Date: 07/27/2017   ADDENDUM REPORT: 07/27/2017 21:47 CONTRAST:  Total of 200 cc Omnipaque 370 administered. Electronically Signed   By: Mitzi Hansen M.D.   On: 07/27/2017 21:47   Result Date: 07/27/2017 CLINICAL DATA:  42 y/o  M; right-sided numbness, acute stroke. EXAM: CT ANGIOGRAPHY HEAD AND NECK CT PERFUSION BRAIN TECHNIQUE: Multidetector CT imaging of the head and neck was performed using the standard protocol during bolus administration of intravenous contrast. Multiplanar CT image reconstructions and MIPs were obtained to evaluate the vascular anatomy. Carotid stenosis measurements (when applicable) are obtained utilizing NASCET criteria, using the distal internal carotid diameter as the denominator. Multiphase CT imaging of the brain was performed following IV bolus contrast injection. Subsequent parametric perfusion maps were calculated using RAPID software. CONTRAST:  See addendum. COMPARISON:  07/27/2016 CT head FINDINGS: CTA NECK FINDINGS Aortic arch: Standard branching. Imaged portion shows no evidence of aneurysm or dissection. No significant stenosis of the major arch vessel origins. Right carotid system: No evidence of dissection, stenosis (50% or greater) or occlusion. Left carotid system: No evidence of dissection, stenosis (50% or greater) or occlusion. Vertebral arteries: Right dominant. No evidence of dissection, stenosis (50% or greater) or occlusion. Skeleton: Negative. Other neck: Nodules in bilateral lobes of thyroid measuring up to 2.6 cm on the right (series 7 image 149). Upper chest: Negative. Review  of the MIP images confirms the above findings CTA HEAD FINDINGS Anterior circulation: Left proximal M1 occlusion with intermediate left MCA distribution  collateralization. Otherwise no significant stenosis, proximal occlusion, aneurysm, or vascular malformation in the anterior circulation. Posterior circulation: No significant stenosis, proximal occlusion, aneurysm, or vascular malformation. Venous sinuses: As permitted by contrast timing, patent. Anatomic variants: Bilateral fetal PCA. Small anterior communicating artery. Review of the MIP images confirms the above findings CT Brain Perfusion Findings: CBF (<30%) Volume: 82mL Perfusion (Tmax>6.0s) volume: Mismatch Volume: 75mL Infarction Location:Left MCA distribution IMPRESSION: CTA head: 1. Left M1 occlusion with intermediate left MCA distribution collateralization. 2. Otherwise patent anterior and posterior intracranial circulation. No additional large vessel occlusion, aneurysm, or significant stenosis is identified. CTA neck: 1. Patent carotid and vertebral arteries. No dissection, aneurysm, or significant stenosis by NASCET criteria. 2. Nodules in bilateral lobes of thyroid measuring up to 2.6 cm on the right. Thyroid ultrasound is recommended on a nonemergent basis. CT perfusion head: By automated RAPID quantification: Infarct core 82 cc, ischemic penumbra 75 cc, infarct location in left MCA distribution. These results were called by telephone at the time of interpretation on 07/27/2017 at 9:23 pm to Dr. Milon Dikes , who verbally acknowledged these results. Electronically Signed: By: Mitzi Hansen M.D. On: 07/27/2017 21:39   Ct Head Code Stroke Wo Contrast  Result Date: 07/27/2017 CLINICAL DATA:  Code stroke. 42 y/o M; right-sided weakness and facial droop. EXAM: CT HEAD WITHOUT CONTRAST TECHNIQUE: Contiguous axial images were obtained from the base of the skull through the vertex without intravenous contrast. COMPARISON:  None. FINDINGS: Brain: Faint lucency involving left insula and frontal operculum. No significant mass effect or hemorrhage at this time. No hydrocephalus, basilar  cistern effacement, or extra-axial collection. Vascular: Dense left M1. Skull: Normal. Negative for fracture or focal lesion. Sinuses/Orbits: No acute finding. Other: None. ASPECTS [facility] Stroke Program Early CT Score) - Ganglionic level infarction (caudate, lentiform nuclei, internal capsule, insula, M1-M3 cortex): 5 - Supraganglionic infarction (M4-M6 cortex): 3 Total score (0-10 with 10 being normal): 8 IMPRESSION: 1. Left insula and frontal operculum lucency compatible with infarction. No hemorrhage or mass effect. 2. Left M1 hyperdensity, likely thrombus. 3. ASPECTS is 8 These results were called by telephone at the time of interpretation on 07/27/2017 at 9:10 pm to Dr. Wilford Corner , who verbally acknowledged these results. Electronically Signed   By: Mitzi Hansen M.D.   On: 07/27/2017 21:12   SIGNIFICANT EVENTS: 1/11 PM: arrived to ER with acute L-MCA CVA >> IV TPA and IR Thrombectomy 1/12: brought to ICU post-procedure, still intubated   LINES/TUBES: PIV's Foley catheter 1/11 >> R-Femoral Aline 1/11>> ETT 1/11>>  DISCUSSION: 41yoM with hx HTN, OSA, Anxiety, and Depression, who was in normal state of health tonight at 8pm then was witnessed to fall off the bed at 8:10pm and found to have right facial droop, slurred speech, and right-sided weakness. EMS was called and brought patient to [facility] ER where Head CT found dense L-MCA ischemic CVA. Patient given IV TPA and then taken for IR Thrombectomy.   ASSESSMENT / PLAN:  PULMONARY 1. Acute Hypoxic Respiratory failure: - continue mechanical ventilation - obtain CXR and ABG - NPO; start GI prophylaxis; SCD's for DVT prophylaxis   2. Hx OSA: - following extubation will need CPAP qHS  CARDIOVASCULAR 1. Hx HTN: - SBP goal 110-140 per Neuro - Nicardipine gtt PRN  RENAL 1. AKI:  - creatinine 1.30, up from baseline of 1.1 (06/28/17) - continue  foley cathter, monitor UOP, avoid nephrotoxic agents.  - continue NS @ 75cc/hr - recheck  BMP in AM  GASTROINTESTINAL No active issues; NPO; GI prophylaxis  HEMATOLOGIC No active issues   INFECTIOUS No active issues   ENDOCRINE No active issues   NEUROLOGIC 1. Acute L-MCA Ischemic CVA: - s/p IV TPA and IR Thrombectomy with partial revascularization of occluded L-MCA at origin x 2 passes with retreiver device, but on 3rd attempt there was transient extravasation of contrast noted, prior to placement of retriever in the microcatheter. Repeat arteriogram a few minutes later showed no extravasation or mass effect. Following this IR and Neuro decided to give FFP to reverse the TPA. Patient receiving FFP now.  - repeat CT planned for 3am today.  - BP control (SBP 110-140) - order TTE, Lipids, UDS, A1c - consult PT/OT/Speech - appreciate Neuro consult  FAMILY  - Updates: no family present at bedside at time of my exam - Inter-disciplinary family meet or Palliative Care meeting due by: 08/04/17  45 minutes critical care time   Milana Obey, MD Pulmonary and Critical Care Medicine [facility] Pager: [phone]  07/28/2017, 12:47 AM

## 2017-07-28 NOTE — Progress Notes (Signed)
PT Cancellation Note  Patient Details Name: Jeremy GashJao Cryder MRN: [id number] DOB: 18-Oct-1975   Cancelled Treatment:    Reason Eval/Treat Not Completed: Patient not medically ready, active bedrest at this time   Fabio AsaDevon J Johanny Segers 07/28/2017, 7:36 AM

## 2017-07-28 NOTE — Progress Notes (Signed)
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 Copy of patient's Healthcare Power Of Attorney form with seal copied and placed in patient's physical chart.

## 2017-07-28 NOTE — Progress Notes (Signed)
Patient ID: Jeremy [name], male   DOB: 11/21/1975, 42 y.o.   MRN: [id number] INR. 42 yr old with acute RT sided weakness and lt gaze deviation.LSW 8 pm  Premorbid  Mod rankin score 0. CT NO ICH. IV TPA given at approx 9.11pm. Lt MCA hyperdense sign. CTA occluded Lt MCA at origin. ASPECTS 8. CTP CBF< 30 % vol 82  Tmax > 6.0s vol 157. Mismatch 75 ml,with ratio of 1.9. Findings of CTP and ASPECT  discussed with  Dr. Verita LambArrora.. Significance  of Rapid in hyperacute stroke  unclear. Because of patients age,ASPECTS score and patient presenting within an hour of ictus ,the option of endovascular revascularization was dscussed with patients wife and family. Procedure,reasons risks alternatives were all reviewed.Risk of ICH of 10 %,worsening neurological deficit,vent dependency,death ,inability to revascularize were all reviewed in detail.Questions were answered to their satisfaction.Informed witnessed consent was obtained for endovascular intervention. S.Deegan Valentino.MD

## 2017-07-28 NOTE — Anesthesia Postprocedure Evaluation (Signed)
Anesthesia Post Note  Patient: Jeremy [name]  Procedure(s) Performed: RADIOLOGY WITH ANESTHESIA (N/A )     Patient location during evaluation: SICU Anesthesia Type: General Level of consciousness: sedated Pain management: pain level controlled Vital Signs Assessment: post-procedure vital signs reviewed and stable Respiratory status: patient remains intubated per anesthesia plan Cardiovascular status: stable Postop Assessment: no apparent nausea or vomiting Anesthetic complications: no    Last Vitals:  Vitals:   07/28/17 0030 07/28/17 0045  BP: (!) 141/113 (!) 127/93  Pulse: 94 84  Resp: 16 18  SpO2: 100% 100%    Last Pain:  Vitals:   07/27/17 2105  PainSc: 0-No pain                 Elleanor Guyett,W. EDMOND

## 2017-07-28 NOTE — Progress Notes (Signed)
Patient's contacts removed and placed in container with label Right and Left. RN will continue to monitor.

## 2017-07-28 NOTE — Procedures (Signed)
S/P bilateral common carotid arteriogram,and RT vert arteriogram S/P endovascular revascularization of occluded LT MCA with partial prox recanalization with x 2 passes with solitaire 4mm x 40 mm retiever device . Procedure stopped after transient extravasation of contrast at time of third attempt without retriever being deployed.

## 2017-07-28 NOTE — Progress Notes (Signed)
FOLLOW UP NOTE s/p CEREBRAL ANGIO AND ATTEMPTED EVT Pt went to IR suite for EVT.  Please see Dr. Fatima Sangereveshwar's note for details.  Unsuccessful Lt M1 thrombectomy with possibly some partial recanalization and possibly contrast extravasaton vs bleed.  Discussed with Dr. Corliss Skainseveshwar, who feels that there might be a high risk of bleed and hematoma expansion. Decision made to reverse tPA. Cryoprecipitate ordered and administered.  Repeat CTH pending at the time of dictation. Will attempt to get a repeat CTH now.  Patient still intubated and in NICU.  Will update family PRN.  Remains critical with significant risk of neurological worsening. Close neuromonitoring and neuroimaging needed to assess need for possible life saving decompressive hemicraniectomy given young age.  Will continue to follow  -- Milon DikesAshish Deaglan Lile, MD [facility] Pager: [phone] If 7pm to 7am, please call on call as listed on AMION.

## 2017-07-28 NOTE — Progress Notes (Signed)
SLP Cancellation Note  Patient Details Name: Jeremy GashJao [name] MRN: [id number] DOB: Oct 04, 1975   Cancelled treatment:       Reason Eval/Treat Not Completed: Patient not medically ready. Pt intubated.   Rondel BatonMary Beth Kaelen Brennan, [name], [name] [phone]   [name] 07/28/2017, 2:22 PM

## 2017-07-28 NOTE — Progress Notes (Signed)
*  PRELIMINARY RESULTS* Vascular Ultrasound Bilateral lower extremity venous duplex has been completed.  Preliminary findings: No evidence of deep vein thrombosis or baker's cysts bilaterally. Right groin not fully visualized due to cath bandage.  Jeremy FischerCharlotte C Matayah Reyburn 07/28/2017, 2:30 PM

## 2017-07-28 NOTE — Progress Notes (Signed)
Jeremy [name] is a 42 yo male with hx of HTN, OSA, depression, and anxiety. He presented to ED via [facility] with Right side numbness, Right side facial droop and incoherent. Per spouse he developed a fever of 103 two days ago, complaining of a persistent headache x3 days, unable to eat, been in the bed for two days thinking he had the flu. He set down with the family attempted to eat dinner and went to bed at 8 pm due to "upset stomach" at 8:10 pt fell out of the bed and found to have Right side weakness, Right side facial droop, and slurred speech. Spouse reports he is compliant with his HTN medications and BP has been "doing good"  NIH 20, CBG 125, LKW 2000 TPA started 2111  transported to IR 2125

## 2017-07-28 NOTE — Progress Notes (Signed)
Patient ID: Cletus GashJao Landowski, male   DOB: 03-03-1976, 42 y.o.   MRN: [id number] INR. Post procedure family conference. Spouse and family members informed of partial revascularization of occluded Lt MCA at origin with  X 2 passes with retriever device . At third  Attempt prior tp placement of retriever in the microcatheter transient extravasation of contrast was noted. Transient PR to  36 from 50s was noted without any change in BP.  Procedure was stopped . Repeat arteriogram, a few mins later revealed no extravasation or mass effect on the intracranial vessels. A dyna CT  revealed peri insular   hyperdensiity  in the perisylvian  space and adjacent subarachnoid space.No mass effect appreciated. D/W Dr.Arora.. Will infuse cryoprecipitate for reversal of TPA, and repeat CT brain.Marland Kitchen. Spouse  and family  Informed of the above.plan of management. Pateint at high risk for further  neurological deterioration due  to ischemic injury and possible ICH.Marland Kitchen. S.Joaquin Knebel MD

## 2017-07-28 NOTE — Progress Notes (Signed)
Initial Nutrition Assessment  DOCUMENTATION CODES:   Not applicable  INTERVENTION:   If unable to extubate within 48 hours, recommend:  Initiate TF via OGT with Vital AF 1.2 at goal rate of 70 ml/h (1680 ml per day) to provide 2016 kcals, 126 gm protein, 1362 ml free water daily.  NUTRITION DIAGNOSIS:   Inadequate oral intake related to inability to eat as evidenced by NPO status.  GOAL:   Patient will meet greater than or equal to 90% of their needs  MONITOR:   Vent status, Labs, Weight trends, Skin, I & O's  REASON FOR ASSESSMENT:   Ventilator    ASSESSMENT:   42 year old with a past history of hypertension with sudden onset of right-sided hemiplegia, right facial droop, slurred speech and forced left gaze deviation consistent with a left MCA syndrome.  1/12- S/P bilateral common carotid arteriogram,and RT vert arteriogram, S/P endovascular revascularization of occluded LT MCA with partial prox recanalization with x 2 passes with solitaire 4mm x 40 mm retiever device .  Per MD, is at risk for ischemic injury and possible ICH.   Patient is currently intubated on ventilator support. OGT in place.  MV: 8.4 L/min Temp (24hrs), Avg:97.7 F (36.5 C), Min:97.2 F (36.2 C), Max:99.3 F (37.4 C)  No family at bedside to obtain further hx. Reviewed wt hx from Care Everywhere; wt has ranged from 185-198# within the past year.   Labs reviewed.   NUTRITION - FOCUSED PHYSICAL EXAM:    Most Recent Value  Orbital Region  No depletion  Upper Arm Region  No depletion  Thoracic and Lumbar Region  No depletion  Buccal Region  No depletion  Temple Region  No depletion  Clavicle Bone Region  No depletion  Clavicle and Acromion Bone Region  No depletion  Scapular Bone Region  No depletion  Dorsal Hand  No depletion  Patellar Region  No depletion  Anterior Thigh Region  No depletion  Posterior Calf Region  No depletion  Edema (RD Assessment)  None  Hair  Reviewed  Eyes   Reviewed  Mouth  Reviewed  Skin  Reviewed  Nails  Reviewed       Diet Order:  Diet NPO time specified  EDUCATION NEEDS:   No education needs have been identified at this time  Skin:  Skin Assessment: Reviewed RN Assessment  Last BM:  PTA  Height:   Ht Readings from Last 1 Encounters:  07/28/17 5\' 10"  (1.778 m)    Weight:   Wt Readings from Last 1 Encounters:  07/28/17 190 lb 0.6 oz (86.2 kg)    Ideal Body Weight:  75.5 kg  BMI:  Body mass index is 27.27 kg/m.  Estimated Nutritional Needs:   Kcal:  2008  Protein:  115-130 grams  Fluid:  > 2 L    Trivia Heffelfinger A. Mayford KnifeWilliams, RD, LDN, CDE Pager: [phone] After hours Pager: [phone]

## 2017-07-28 NOTE — Progress Notes (Signed)
Pt transported on vent to CT and returned to 4N31. Pt's vitals remained stable throughout the trip.

## 2017-07-29 ENCOUNTER — Inpatient Hospital Stay (HOSPITAL_COMMUNITY): Payer: BLUE CROSS/BLUE SHIELD | Admitting: Anesthesiology

## 2017-07-29 ENCOUNTER — Inpatient Hospital Stay (HOSPITAL_COMMUNITY): Payer: BLUE CROSS/BLUE SHIELD

## 2017-07-29 ENCOUNTER — Encounter (HOSPITAL_COMMUNITY)
Admission: EM | Disposition: A | Discharge status: Rehabilitation Facility | Payer: Self-pay | Source: Home / Self Care | Attending: Neurology

## 2017-07-29 DIAGNOSIS — I63512 Cerebral infarction due to unspecified occlusion or stenosis of left middle cerebral artery: Secondary | ICD-10-CM

## 2017-07-29 HISTORY — PX: CRANIOTOMY: SHX93

## 2017-07-29 LAB — CBC
HCT: 34 % — ABNORMAL LOW (ref 39.0–52.0)
Hemoglobin: 11.4 g/dL — ABNORMAL LOW (ref 13.0–17.0)
MCH: 30.3 pg (ref 26.0–34.0)
MCHC: 33.5 g/dL (ref 30.0–36.0)
MCV: 90.4 fL (ref 78.0–100.0)
PLATELETS: 185 10*3/uL (ref 150–400)
RBC: 3.76 MIL/uL — ABNORMAL LOW (ref 4.22–5.81)
RDW: 13 % (ref 11.5–15.5)
WBC: 9.7 10*3/uL (ref 4.0–10.5)

## 2017-07-29 LAB — BPAM CRYOPRECIPITATE
Blood Product Expiration Date: 201901120535
Blood Product Expiration Date: 201901120535
ISSUE DATE / TIME: 201901120048
ISSUE DATE / TIME: 201901120048
UNIT TYPE AND RH: 5100
Unit Type and Rh: 5100

## 2017-07-29 LAB — PREPARE CRYOPRECIPITATE
UNIT DIVISION: 0
Unit division: 0

## 2017-07-29 LAB — SODIUM
Sodium: 147 mmol/L — ABNORMAL HIGH (ref 135–145)
Sodium: 147 mmol/L — ABNORMAL HIGH (ref 135–145)

## 2017-07-29 LAB — BASIC METABOLIC PANEL
ANION GAP: 9 (ref 5–15)
BUN: 16 mg/dL (ref 6–20)
CALCIUM: 8 mg/dL — AB (ref 8.9–10.3)
CO2: 23 mmol/L (ref 22–32)
CREATININE: 0.83 mg/dL (ref 0.61–1.24)
Chloride: 112 mmol/L — ABNORMAL HIGH (ref 101–111)
GFR calc Af Amer: >60 mL/min (ref >60)
GLUCOSE: 134 mg/dL — AB (ref 65–99)
Potassium: 3.8 mmol/L (ref 3.5–5.1)
Sodium: 144 mmol/L (ref 135–145)

## 2017-07-29 LAB — VITAMIN B12: Vitamin B-12: 161 pg/mL — ABNORMAL LOW (ref 180–914)

## 2017-07-29 LAB — ECHOCARDIOGRAM COMPLETE
Height: 70 in
WEIGHTICAEL: 3114.66 [oz_av]

## 2017-07-29 LAB — TSH: TSH: 0.278 u[IU]/mL — ABNORMAL LOW (ref 0.350–4.500)

## 2017-07-29 SURGERY — CRANIOTOMY HEMATOMA EVACUATION SUBDURAL
Anesthesia: General | Site: Head | Laterality: Left

## 2017-07-29 MED ORDER — BACITRACIN ZINC 500 UNIT/GM EX OINT
TOPICAL_OINTMENT | CUTANEOUS | Status: AC
  Filled 2017-07-29: qty 28.35

## 2017-07-29 MED ORDER — BUPIVACAINE HCL (PF) 0.5 % IJ SOLN
INTRAMUSCULAR | Status: AC
  Filled 2017-07-29: qty 30

## 2017-07-29 MED ORDER — PROPOFOL 10 MG/ML IV BOLUS
INTRAVENOUS | Status: AC
  Filled 2017-07-29: qty 20

## 2017-07-29 MED ORDER — ROCURONIUM BROMIDE 10 MG/ML (PF) SYRINGE
PREFILLED_SYRINGE | INTRAVENOUS | Status: DC | PRN
  Administered 2017-07-29 (×2): 50 mg via INTRAVENOUS

## 2017-07-29 MED ORDER — PROPOFOL 10 MG/ML IV BOLUS
INTRAVENOUS | Status: DC | PRN
  Administered 2017-07-29: 100 mg via INTRAVENOUS

## 2017-07-29 MED ORDER — THROMBIN (RECOMBINANT) 20000 UNITS EX SOLR
CUTANEOUS | Status: DC | PRN
  Administered 2017-07-29: 13:00:00 via TOPICAL

## 2017-07-29 MED ORDER — THROMBIN (RECOMBINANT) 5000 UNITS EX SOLR
CUTANEOUS | Status: DC | PRN
  Administered 2017-07-29: 13:00:00 via TOPICAL

## 2017-07-29 MED ORDER — CHLORHEXIDINE GLUCONATE CLOTH 2 % EX PADS
6.0000 | MEDICATED_PAD | Freq: Every day | CUTANEOUS | Status: DC
  Administered 2017-07-29 – 2017-08-01 (×4): 6 via TOPICAL

## 2017-07-29 MED ORDER — FENTANYL CITRATE (PF) 250 MCG/5ML IJ SOLN
INTRAMUSCULAR | Status: AC
  Filled 2017-07-29: qty 5

## 2017-07-29 MED ORDER — PROPOFOL 500 MG/50ML IV EMUL
INTRAVENOUS | Status: DC | PRN
  Administered 2017-07-29: 25 ug/kg/min via INTRAVENOUS

## 2017-07-29 MED ORDER — THROMBIN (RECOMBINANT) 20000 UNITS EX SOLR
CUTANEOUS | Status: AC
  Filled 2017-07-29: qty 20000

## 2017-07-29 MED ORDER — HEMOSTATIC AGENTS (NO CHARGE) OPTIME
TOPICAL | Status: DC | PRN
  Administered 2017-07-29: 1 via TOPICAL

## 2017-07-29 MED ORDER — SODIUM CHLORIDE 0.9 % IR SOLN
Status: DC | PRN
  Administered 2017-07-29: 13:00:00

## 2017-07-29 MED ORDER — LIDOCAINE-EPINEPHRINE 1 %-1:100000 IJ SOLN
INTRAMUSCULAR | Status: DC | PRN
  Administered 2017-07-29: 7.5 mL

## 2017-07-29 MED ORDER — ONDANSETRON HCL 4 MG/2ML IJ SOLN
INTRAMUSCULAR | Status: DC | PRN
  Administered 2017-07-29: 4 mg via INTRAVENOUS

## 2017-07-29 MED ORDER — SODIUM CHLORIDE 23.4 % INJECTION (4 MEQ/ML) FOR IV ADMINISTRATION
30.0000 mL | Freq: Once | INTRAVENOUS | Status: AC
  Administered 2017-07-29: 30 mL via INTRAVENOUS
  Filled 2017-07-29: qty 30

## 2017-07-29 MED ORDER — MANNITOL 25 % IV SOLN
INTRAVENOUS | Status: DC | PRN
  Administered 2017-07-29: 50 g via INTRAVENOUS

## 2017-07-29 MED ORDER — BUPIVACAINE HCL (PF) 0.5 % IJ SOLN
INTRAMUSCULAR | Status: DC | PRN
  Administered 2017-07-29: 7.5 mL

## 2017-07-29 MED ORDER — MORPHINE SULFATE (PF) 4 MG/ML IV SOLN: 2.0000 mg | INTRAVENOUS | Status: DC | PRN

## 2017-07-29 MED ORDER — LIDOCAINE-EPINEPHRINE 1 %-1:100000 IJ SOLN
INTRAMUSCULAR | Status: AC
  Filled 2017-07-29: qty 1

## 2017-07-29 MED ORDER — 0.9 % SODIUM CHLORIDE (POUR BTL) OPTIME
TOPICAL | Status: DC | PRN
  Administered 2017-07-29 (×2): 1000 mL

## 2017-07-29 MED ORDER — FENTANYL CITRATE (PF) 250 MCG/5ML IJ SOLN
INTRAMUSCULAR | Status: DC | PRN
  Administered 2017-07-29 (×5): 50 ug via INTRAVENOUS

## 2017-07-29 MED ORDER — MIDAZOLAM HCL 2 MG/2ML IJ SOLN
INTRAMUSCULAR | Status: AC
Start: 2017-07-29 — End: 2017-07-29
  Filled 2017-07-29: qty 2

## 2017-07-29 MED ORDER — SODIUM CHLORIDE 0.9 % IV SOLN
INTRAVENOUS | Status: DC | PRN
  Administered 2017-07-29: 12:00:00 via INTRAVENOUS

## 2017-07-29 MED ORDER — CEFAZOLIN SODIUM-DEXTROSE 2-3 GM-%(50ML) IV SOLR
INTRAVENOUS | Status: DC | PRN
  Administered 2017-07-29: 2 g via INTRAVENOUS

## 2017-07-29 MED ORDER — THROMBIN (RECOMBINANT) 5000 UNITS EX SOLR
CUTANEOUS | Status: AC
  Filled 2017-07-29: qty 5000

## 2017-07-29 SURGICAL SUPPLY — 72 items
BENZOIN TINCTURE PRP APPL 2/3 (GAUZE/BANDAGES/DRESSINGS) IMPLANT
BLADE CLIPPER SURG (BLADE) ×3 IMPLANT
BNDG GAUZE ELAST 4 BULKY (GAUZE/BANDAGES/DRESSINGS) IMPLANT
BUR ACORN 6.0 PRECISION (BURR) ×2 IMPLANT
BUR ACORN 6.0MM PRECISION (BURR) ×1
BUR MATCHSTICK NEURO 3.0 LAGG (BURR) ×3 IMPLANT
BUR SPIRAL ROUTER 2.3 (BUR) ×2 IMPLANT
BUR SPIRAL ROUTER 2.3MM (BUR) ×1
BUR TAPERED ROUTER 3.0 (BURR) ×3 IMPLANT
CANISTER SUCT 3000ML PPV (MISCELLANEOUS) ×3 IMPLANT
CARTRIDGE OIL MAESTRO DRILL (MISCELLANEOUS) ×1 IMPLANT
CLIP RANEY DISP (INSTRUMENTS) ×3 IMPLANT
DIFFUSER DRILL AIR PNEUMATIC (MISCELLANEOUS) ×3 IMPLANT
DRAPE NEUROLOGICAL W/INCISE (DRAPES) ×3 IMPLANT
DRAPE SURG 17X23 STRL (DRAPES) IMPLANT
DRAPE WARM FLUID 44X44 (DRAPE) ×3 IMPLANT
DRSG OPSITE POSTOP 4X8 (GAUZE/BANDAGES/DRESSINGS) ×3 IMPLANT
DRSG TELFA 3X8 NADH (GAUZE/BANDAGES/DRESSINGS) ×3 IMPLANT
DURAMATRIX ONLAY 3X3 (Plate) ×6 IMPLANT
DURAPREP 26ML APPLICATOR (WOUND CARE) ×3 IMPLANT
DURAPREP 6ML APPLICATOR 50/CS (WOUND CARE) ×3 IMPLANT
ELECT CAUTERY BLADE 6.4 (BLADE) ×3 IMPLANT
ELECT REM PT RETURN 9FT ADLT (ELECTROSURGICAL) ×3
ELECTRODE REM PT RTRN 9FT ADLT (ELECTROSURGICAL) ×1 IMPLANT
EVACUATOR 1/8 PVC DRAIN (DRAIN) IMPLANT
EVACUATOR SILICONE 100CC (DRAIN) IMPLANT
GAUZE SPONGE 4X4 12PLY STRL (GAUZE/BANDAGES/DRESSINGS) ×3 IMPLANT
GAUZE SPONGE 4X4 16PLY XRAY LF (GAUZE/BANDAGES/DRESSINGS) IMPLANT
GLOVE BIO SURGEON STRL SZ7.5 (GLOVE) IMPLANT
GLOVE BIOGEL PI IND STRL 7.0 (GLOVE) ×1 IMPLANT
GLOVE BIOGEL PI IND STRL 7.5 (GLOVE) ×4 IMPLANT
GLOVE BIOGEL PI IND STRL 8 (GLOVE) ×3 IMPLANT
GLOVE BIOGEL PI INDICATOR 7.0 (GLOVE) ×2
GLOVE BIOGEL PI INDICATOR 7.5 (GLOVE) ×8
GLOVE BIOGEL PI INDICATOR 8 (GLOVE) ×6
GLOVE ECLIPSE 7.0 STRL STRAW (GLOVE) ×6 IMPLANT
GLOVE ECLIPSE 7.5 STRL STRAW (GLOVE) ×3 IMPLANT
GOWN STRL REUS W/ TWL LRG LVL3 (GOWN DISPOSABLE) ×2 IMPLANT
GOWN STRL REUS W/ TWL XL LVL3 (GOWN DISPOSABLE) IMPLANT
GOWN STRL REUS W/TWL 2XL LVL3 (GOWN DISPOSABLE) ×3 IMPLANT
GOWN STRL REUS W/TWL LRG LVL3 (GOWN DISPOSABLE) ×4
GOWN STRL REUS W/TWL XL LVL3 (GOWN DISPOSABLE)
HEMOSTAT POWDER KIT SURGIFOAM (HEMOSTASIS) ×3 IMPLANT
HEMOSTAT SURGICEL 2X14 (HEMOSTASIS) ×3 IMPLANT
HOOK DURA 1/2IN (MISCELLANEOUS) ×3 IMPLANT
KIT BASIN OR (CUSTOM PROCEDURE TRAY) ×3 IMPLANT
KIT ROOM TURNOVER OR (KITS) ×3 IMPLANT
NEEDLE HYPO 22GX1.5 SAFETY (NEEDLE) ×3 IMPLANT
NS IRRIG 1000ML POUR BTL (IV SOLUTION) ×6 IMPLANT
OIL CARTRIDGE MAESTRO DRILL (MISCELLANEOUS) ×3
PACK CRANIOTOMY (CUSTOM PROCEDURE TRAY) ×3 IMPLANT
PATTIES SURGICAL .5 X.5 (GAUZE/BANDAGES/DRESSINGS) IMPLANT
PATTIES SURGICAL .5 X3 (DISPOSABLE) IMPLANT
PATTIES SURGICAL 1X1 (DISPOSABLE) IMPLANT
PERFORATOR LRG  14-11MM (BIT) ×2
PERFORATOR LRG 14-11MM (BIT) ×1 IMPLANT
SPONGE NEURO XRAY DETECT 1X3 (DISPOSABLE) IMPLANT
SPONGE SURGIFOAM ABS GEL 100 (HEMOSTASIS) ×3 IMPLANT
STAPLER VISISTAT 35W (STAPLE) ×6 IMPLANT
STOCKINETTE 6  STRL (DRAPES) ×2
STOCKINETTE 6 STRL (DRAPES) ×1 IMPLANT
SUT NURALON 4 0 TR CR/8 (SUTURE) ×3 IMPLANT
SUT VIC AB 0 CT1 18XCR BRD8 (SUTURE) ×3 IMPLANT
SUT VIC AB 0 CT1 8-18 (SUTURE) ×6
SUT VIC AB 3-0 SH 8-18 (SUTURE) ×12 IMPLANT
TOWEL GREEN STERILE (TOWEL DISPOSABLE) ×6 IMPLANT
TOWEL GREEN STERILE FF (TOWEL DISPOSABLE) ×6 IMPLANT
TUBE CONNECTING 12'X1/4 (SUCTIONS) ×1
TUBE CONNECTING 12X1/4 (SUCTIONS) ×2 IMPLANT
TUBE CONNECTING 20'X1/4 (TUBING) ×1
TUBE CONNECTING 20X1/4 (TUBING) ×2 IMPLANT
WATER STERILE IRR 1000ML POUR (IV SOLUTION) ×3 IMPLANT

## 2017-07-29 NOTE — Progress Notes (Deleted)
Patient unable to be assessed prior to arriving to OR due to intubation

## 2017-07-29 NOTE — Progress Notes (Signed)
SLP Cancellation Note  Patient Details Name: Jeremy [name] MRN: [id number] DOB: 07/06/1976   Cancelled treatment:       Reason Eval/Treat Not Completed: Patient not medically ready. Pt remains on ventilator. SLP will s/o, please reorder when appropriate.  [name], [name], [name] [phone]   [name] 07/29/2017, 5:14 PM

## 2017-07-29 NOTE — Progress Notes (Signed)
PT Cancellation Note  Patient Details Name: Cletus GashJao Schlafer MRN: [id number] DOB: [birth year]   Cancelled Treatment:    Reason Eval/Treat Not Completed: Medical issues which prohibited therapy   Robb Sibal B Marselino Slayton 07/29/2017, 7:57 AM  Delaney MeigsMaija Tabor Analy [place], [place] [phone]

## 2017-07-29 NOTE — Progress Notes (Signed)
Pt does not accept blood. He confirmed this, it is also documented in his advanced directives. I removed his blood bank arm band. I listed blood (does not accept) as an allergy. I also wrote no blood products on front of paper chart. Dr. Conchita ParisNundkumar aware of pt's wishes.

## 2017-07-29 NOTE — Progress Notes (Signed)
OT Cancellation Note  Patient Details Name: Jeremy [name] MRN: [id number] DOB: 1976/01/03   Cancelled Treatment:    Reason Eval/Treat Not Completed: Patient not medically ready.  Gaye AlkenBailey A Pepper Wyndham M.S., OTR/L Pager: [phone]  07/29/2017, 8:00 AM

## 2017-07-29 NOTE — Progress Notes (Signed)
CT read by Radiologist as >shift. Text paged this to Dr Roda ShuttersXu. Exam unchanged. Orders received.

## 2017-07-29 NOTE — Progress Notes (Signed)
  Echocardiogram 2D Echocardiogram has been performed.  Jeremy [name] Jeremy [name] 07/29/2017, 9:38 AM

## 2017-07-29 NOTE — Consult Note (Signed)
  Chief Complaint   Chief Complaint  Patient presents with  . Code Stroke    History of Present Illness  Jeremy [name] is a 42 y.o. male presenting a few days ago with left MCA territory stroke who underwent unsuccessful endovascular thrombectomy. He has remained neurologically stable but serial CT and MRI has demonstrated progressive edema with mass effect and I was consulted for hemicraniectomy.  Past Medical History   Past Medical History:  Diagnosis Date  . Anxiety   . Asthma    MILD AS A CHILD  . Depression   . Deviated septum   . Dyspnea    NORMAL SPIROMETRY 02/01/17 VISIT WITH DR HEDRICK  . Hypertension   . Sleep apnea     Past Surgical History   Past Surgical History:  Procedure Laterality Date  . NASAL SEPTOPLASTY W/ TURBINOPLASTY Bilateral 03/06/2017   Procedure: NASAL SEPTOPLASTY WITH TURBINATE REDUCTION;  Surgeon: Linus SalmonsMcQueen, Chapman, MD;  Location: [facility] ORS;  Service: ENT;  Laterality: Bilateral;    Social History   Social History   Tobacco Use  . Smoking status: Never Smoker  . Smokeless tobacco: Never Used  Substance Use Topics  . Alcohol use: Yes    Comment: RARE  . Drug use: No    Medications   Prior to Admission medications   Medication Sig Start Date End Date Taking? Authorizing Provider  spironolactone (ALDACTONE) 100 MG tablet Take 100 mg by mouth daily. 07/20/17  Yes [provider]  amLODipine (NORVASC) 10 MG tablet Take 10 mg by mouth at bedtime. 10/20/16   [provider]  KLOR-CON M20 20 MEQ tablet Take 40 mEq by mouth daily. 01/01/17   [provider]  oxyCODONE-acetaminophen (ROXICET) 5-325 MG tablet Take 1-2 tablets by mouth every 4 (four) hours as needed for severe pain. 03/06/17   Linus SalmonsMcQueen, Chapman, MD  sulfamethoxazole-trimethoprim (BACTRIM DS,SEPTRA DS) 800-160 MG tablet Take 1 tablet by mouth 2 (two) times daily. Patient not taking: Reported on 07/28/2017 03/06/17   Linus SalmonsMcQueen, Chapman, MD  triamterene-hydrochlorothiazide  (MAXZIDE-25) 37.5-25 MG tablet Take 1 tablet by mouth daily. 01/04/17   [provider]    Allergies   Allergies  Allergen Reactions  . Blood-Group Specific Substance     PT [facility] WITNESS DOES NOT ACCEPT BLOOD PRODUCTS    Review of Systems  ROS  Neurologic Exam  Arousable to voice Intubated but breathing over vent Nods appropriately to questions Follows commands LUE/LLE No movement RUE/RLE  Imaging  Serial CT and MRI demonstrates complete left MCA territory infarct with progressive development of edema and L->R midline shift now measuring nearly 1cm, and enlargement of right lateral ventricle.  Impression  - 42 y.o. male s/p complete left MCA territory infarction with progressive mass effect but relatively stable neurologic exam  Plan  - OR this am for left hemicraniectomy  I reviewed the treatment options with the patient's wife. I recommended hemicraniectomy at this point given his young age, stable exam but progressive imaging findings. Risks were reviewed including bleeding, infection, SZ, HCP, coma, death. Pt is a Jehov[employer]'s Witness and does not wish to be transfused even in the event of life threatening bleeding. All her questions were answered and consent was obtained from his wife.

## 2017-07-29 NOTE — Op Note (Signed)
PREOP DIAGNOSIS:  1. Left MCA stroke 2. Intracranial hypertension   POSTOP DIAGNOSIS: Same  PROCEDURE: 1. Left decompressive hemicraniectomy 2. Placement of bone flap in abdominal subcutaneous pocket  SURGEON: Dr. Lisbeth RenshawNeelesh Zuriel Roskos, MD  ASSISTANT: Cindra PresumeVincent Costella, PA-C  ANESTHESIA: General Endotracheal  EBL: 200cc  SPECIMENS: None  DRAINS: None  COMPLICATIONS: None immediate  CONDITION: Stable to ICU  HISTORY: Jeremy [name] is a 42 y.o. male admitted a few days ago with entire left MCA distribution stroke.  He underwent unsuccessful mechanical thrombectomy.  He has been observed in the intensive care unit with serial neurologic exams as well as serial CT scans.  Although his neurologic exam has remained relatively stable, imaging has demonstrated progressive edema with mass-effect and midline shift.  Decompressive hemicraniectomy is therefore indicated.  The risks and benefits as well as alternatives to surgery were explained in detail to the patient's wife.  After all questions were answered informed consent was obtained and witnessed.  PROCEDURE IN DETAIL: After informed consent was obtained and witnessed, the patient was brought to the operating room. After induction of general anesthesia, the patient was positioned on the operative table in the supine position. All pressure points were meticulously padded. Skin incision was then marked out and prepped and draped in the usual sterile fashion, as was the abdomen.  After timeout was conducted, hemicraniectomy type reverse question mark skin incision was infiltrated with local anesthetic with epinephrine.  Incision was made sharply and carried through the galea.  Hemostasis on the skin edges was achieved with Raney clips.  Bovie electrocautery was then used to dissect through periosteum, and a single piece flap was elevated.  High-speed drill was then used to create multiple bur holes over the left frontal, parietal, and temporal bones.   Craniotome was then used to elevate a large hemicraniectomy type flap.  Aundria RudRogers were used to remove the portion of the squamous temporal bone down to the temporal floor in order to achieve good subtemporal decompression.  Hemostasis on the epidural surface was achieved with bipolar electrocautery.  The dura was then incised in starburst fashion.  The brain was noted to be somewhat edematous and began to slowly herniate through the dural defect.  Hemostasis on the brain surface was achieved with a combination of bipolar electrocautery and morselized Gelfoam with thrombin.  Dura was then placed back over the brain, and collagen dural substitute was placed over the defect.  Temporalis muscle was then closed with interrupted 0 Vicryl stitches, and galea was closed with interrupted 0 and 3-0 Vicryl stitches.  Skin was closed with staples.  Attention was then turned to placement of the bone flap in an abdominal subcutaneous pocket.  A subcostal incision was made in the right upper quadrant.  Bovie electrocautery was used to dissect through subtenons tissue until the rectus fascia was identified.  The skin was then undermined on top of the rectus fascia to create a large subcutaneous pocket to accommodate the bone flap.  Bone flap was then placed.  The pocket was irrigated with copious amounts of normal saline irrigation.  The incision was then closed with interrupted 0 Vicryl stitches and skin staples.  Sterile dressings with bacitracin were then applied to both incisions.  At the end of the case all sponge, needle, instrument, and cottonoid counts were correct.  The patient was transferred to the stretcher and taken to the intensive care unit in stable hemodynamic condition.

## 2017-07-29 NOTE — Progress Notes (Signed)
STROKE TEAM PROGRESS NOTE   SUBJECTIVE (INTERVAL HISTORY) His wife is at the bedside.  Pt still intubated not on sedation. Eyes closed, not open on voice but still follows all simple commands on the left. MRI and CT head overnight showed cerebral edema with midline shift. Gave 23.4% saline x 2. Na 144. Discussed with Dr. Kathyrn Sheriff, will go for hemicrani today.   Past Medical History:  Diagnosis Date  . Anxiety   . Asthma    MILD AS A CHILD  . Depression   . Deviated septum   . Dyspnea    NORMAL SPIROMETRY 02/01/17 VISIT WITH DR [place]  . Hypertension   . Sleep apnea    FAMILY HISTORY History reviewed. No pertinent family history.  SOCIAL HISTORY  reports that  has never smoked. he has never used smokeless tobacco. He reports that he drinks alcohol. He reports that he does not use drugs.   HOME MEDICATIONS:  Current Meds  Medication Sig  . spironolactone (ALDACTONE) 100 MG tablet Take 100 mg by mouth daily.      HOSPITAL MEDICATIONS:  . Jeremy [name] Hold] chlorhexidine gluconate (MEDLINE KIT)  15 mL Mouth Rinse BID  . [MAR Hold] Chlorhexidine Gluconate Cloth  6 each Topical Q0600  . [MAR Hold] mouth rinse  15 mL Mouth Rinse 10 times per day  . [MAR Hold] pantoprazole (PROTONIX) IV  40 mg Intravenous QHS    OBJECTIVE Temp:  [98.2 F (36.8 C)-99.2 F (37.3 C)] 98.5 F (36.9 C) (01/13 1153) Pulse Rate:  [49-92] 56 (01/13 1200) Cardiac Rhythm: Sinus bradycardia (01/13 0800) Resp:  [14-19] 17 (01/13 1200) BP: (110-149)/(73-98) 149/98 (01/13 1200) SpO2:  [99 %-100 %] 100 % (01/13 1200) FiO2 (%):  [40 %] 40 % (01/13 0813) Weight:  [194 lb 10.7 oz (88.3 kg)] 194 lb 10.7 oz (88.3 kg) (01/13 0449)  CBC:  Recent Labs  Lab 07/27/17 2055  07/28/17 0259 07/29/17 0503  WBC 6.6  --  8.0 9.7  NEUTROABS 3.2  --  6.3  --   HGB 16.1   < > 13.2 11.4*  HCT 45.5   < > 37.9* 34.0*  MCV 88.0  --  87.5 90.4  PLT 228  --  203 185   < > = values in this interval not displayed.    Basic  Metabolic Panel:  Recent Labs  Lab 07/28/17 0259  07/28/17 2303 07/29/17 0503  NA 136   < > 139 144  K 3.9  --   --  3.8  CL 105  --   --  112*  CO2 24  --   --  23  GLUCOSE 123*  --   --  134*  BUN 18  --   --  16  CREATININE 0.95  --   --  0.83  CALCIUM 8.2*  --   --  8.0*  MG 2.2  --   --   --   PHOS 3.2  --   --   --    < > = values in this interval not displayed.    Lipid Panel:     Component Value Date/Time   CHOL 218 (H) 07/28/2017 0259   TRIG 417 (H) 07/28/2017 0259   TRIG 428 (H) 07/28/2017 0259   HDL 28 (L) 07/28/2017 0259   CHOLHDL 7.8 07/28/2017 0259   VLDL UNABLE TO CALCULATE IF TRIGLYCERIDE OVER 400 mg/dL 07/28/2017 0259   LDLCALC UNABLE TO CALCULATE IF TRIGLYCERIDE OVER 400 mg/dL 07/28/2017 0259  HgbA1c:  Lab Results  Component Value Date   HGBA1C 5.2 07/28/2017   Urine Drug Screen:     Component Value Date/Time   LABOPIA NONE DETECTED 07/28/2017 0415   COCAINSCRNUR NONE DETECTED 07/28/2017 0415   LABBENZ NONE DETECTED 07/28/2017 0415   AMPHETMU NONE DETECTED 07/28/2017 [place] DETECTED 07/28/2017 0415   LABBARB NONE DETECTED 07/28/2017 0415    Alcohol Level No results found for: [place] I have personally reviewed the radiological images below and agree with the radiology interpretations.  Ct Angio Head W Or Wo Contrast  Addendum Date: 07/27/2017   ADDENDUM REPORT: 07/27/2017 21:47 CONTRAST:  Total of 200 cc Omnipaque 370 administered. Electronically Signed   By: Kristine Garbe M.D.   On: 07/27/2017 21:47   Result Date: 07/27/2017 CLINICAL DATA:  42 y/o  M; right-sided numbness, acute stroke. EXAM: CT ANGIOGRAPHY HEAD AND NECK CT PERFUSION BRAIN TECHNIQUE: Multidetector CT imaging of the head and neck was performed using the standard protocol during bolus administration of intravenous contrast. Multiplanar CT image reconstructions and MIPs were obtained to evaluate the vascular anatomy. Carotid stenosis measurements (when  applicable) are obtained utilizing NASCET criteria, using the distal internal carotid diameter as the denominator. Multiphase CT imaging of the brain was performed following IV bolus contrast injection. Subsequent parametric perfusion maps were calculated using RAPID software. CONTRAST:  See addendum. COMPARISON:  07/27/2016 CT head FINDINGS: CTA NECK FINDINGS Aortic arch: Standard branching. Imaged portion shows no evidence of aneurysm or dissection. No significant stenosis of the major arch vessel origins. Right carotid system: No evidence of dissection, stenosis (50% or greater) or occlusion. Left carotid system: No evidence of dissection, stenosis (50% or greater) or occlusion. Vertebral arteries: Right dominant. No evidence of dissection, stenosis (50% or greater) or occlusion. Skeleton: Negative. Other neck: Nodules in bilateral lobes of thyroid measuring up to 2.6 cm on the right (series 7 image 149). Upper chest: Negative. Review of the MIP images confirms the above findings CTA HEAD FINDINGS Anterior circulation: Left proximal M1 occlusion with intermediate left MCA distribution collateralization. Otherwise no significant stenosis, proximal occlusion, aneurysm, or vascular malformation in the anterior circulation. Posterior circulation: No significant stenosis, proximal occlusion, aneurysm, or vascular malformation. Venous sinuses: As permitted by contrast timing, patent. Anatomic variants: Bilateral fetal PCA. Small anterior communicating artery. Review of the MIP images confirms the above findings CT Brain Perfusion Findings: CBF (<30%) Volume: 45m Perfusion (Tmax>6.0s) volume: 1561mMismatch Volume: 7574mnfarction Location:Left MCA distribution IMPRESSION: CTA head: 1. Left M1 occlusion with intermediate left MCA distribution collateralization. 2. Otherwise patent anterior and posterior intracranial circulation. No additional large vessel occlusion, aneurysm, or significant stenosis is identified. CTA  neck: 1. Patent carotid and vertebral arteries. No dissection, aneurysm, or significant stenosis by NASCET criteria. 2. Nodules in bilateral lobes of thyroid measuring up to 2.6 cm on the right. Thyroid ultrasound is recommended on a nonemergent basis. CT perfusion head: By automated RAPID quantification: Infarct core 82 cc, ischemic penumbra 75 cc, infarct location in left MCA distribution. These results were called by telephone at the time of interpretation on 07/27/2017 at 9:23 pm to Dr. ASHAmie Portlandwho verbally acknowledged these results. Electronically Signed: By: LanKristine GarbeD. On: 07/27/2017 21:39   Ct Head Wo Contrast  Result Date: 07/28/2017 CLINICAL DATA:  Follow-up suspected LEFT middle cerebral artery extravasation during incomplete thrombectomy. EXAM: CT HEAD WITHOUT CONTRAST TECHNIQUE: Contiguous axial images were obtained from the base of the skull through  the vertex without intravenous contrast. COMPARISON:  Multiple CT HEAD May 27, 2018 FINDINGS: BRAIN: Increased density LEFT MCA distribution extending into LEFT insula and LEFT frontoparietal sulci measuring to 91 Hounsfield units. Faint density interpeduncular cistern measuring 50 Hounsfield units. Confluent LEFT frontotemporal parietal hypodensity extending to LEFT basal ganglia. No convincing evidence of intraparenchymal hemorrhage. Regional LEFT cerebrum mass effect without midline shift. No hydrocephalus. Basal cisterns are patent. 1 cm cyst in caudal aspect splenium of corpus callosum versus pineal cyst, less likely. VASCULAR: Dense LEFT MCA (55 Hounsfield units). New density along anterior cerebral artery distribution. SKULL/SOFT TISSUES: No skull fracture. No significant soft tissue swelling. ORBITS/SINUSES: The included ocular globes and orbital contents are normal.Mild paranasal sinusitis. Life-support lines in place. Mastoid air cells are well aerated. OTHER: None. IMPRESSION: 1. Evolving acute large LEFT MCA  territory infarct without hemorrhagic conversion. 2. Postprocedural extra-axial contrast extravasation in addition to probable contrast staining and small volume subarachnoid hemorrhage. 3. New density anterior cerebral artery concerning for thromboembolism. Critical Value/emergent results were called by telephone at the time of interpretation on 07/28/2017 at 4:00 am to Dr. Amie Portland , who verbally acknowledged these results. Electronically Signed   By: Elon Alas M.D.   On: 07/28/2017 04:16   Ct Angio Neck W Or Wo Contrast  Addendum Date: 07/27/2017   ADDENDUM REPORT: 07/27/2017 21:47 CONTRAST:  Total of 200 cc Omnipaque 370 administered. Electronically Signed   By: Kristine Garbe M.D.   On: 07/27/2017 21:47   Result Date: 07/27/2017 CLINICAL DATA:  42 y/o  M; right-sided numbness, acute stroke. EXAM: CT ANGIOGRAPHY HEAD AND NECK CT PERFUSION BRAIN TECHNIQUE: Multidetector CT imaging of the head and neck was performed using the standard protocol during bolus administration of intravenous contrast. Multiplanar CT image reconstructions and MIPs were obtained to evaluate the vascular anatomy. Carotid stenosis measurements (when applicable) are obtained utilizing NASCET criteria, using the distal internal carotid diameter as the denominator. Multiphase CT imaging of the brain was performed following IV bolus contrast injection. Subsequent parametric perfusion maps were calculated using RAPID software. CONTRAST:  See addendum. COMPARISON:  07/27/2016 CT head FINDINGS: CTA NECK FINDINGS Aortic arch: Standard branching. Imaged portion shows no evidence of aneurysm or dissection. No significant stenosis of the major arch vessel origins. Right carotid system: No evidence of dissection, stenosis (50% or greater) or occlusion. Left carotid system: No evidence of dissection, stenosis (50% or greater) or occlusion. Vertebral arteries: Right dominant. No evidence of dissection, stenosis (50% or greater)  or occlusion. Skeleton: Negative. Other neck: Nodules in bilateral lobes of thyroid measuring up to 2.6 cm on the right (series 7 image 149). Upper chest: Negative. Review of the MIP images confirms the above findings CTA HEAD FINDINGS Anterior circulation: Left proximal M1 occlusion with intermediate left MCA distribution collateralization. Otherwise no significant stenosis, proximal occlusion, aneurysm, or vascular malformation in the anterior circulation. Posterior circulation: No significant stenosis, proximal occlusion, aneurysm, or vascular malformation. Venous sinuses: As permitted by contrast timing, patent. Anatomic variants: Bilateral fetal PCA. Small anterior communicating artery. Review of the MIP images confirms the above findings CT Brain Perfusion Findings: CBF (<30%) Volume: 24m Perfusion (Tmax>6.0s) volume: 1570mMismatch Volume: 7560mnfarction Location:Left MCA distribution IMPRESSION: CTA head: 1. Left M1 occlusion with intermediate left MCA distribution collateralization. 2. Otherwise patent anterior and posterior intracranial circulation. No additional large vessel occlusion, aneurysm, or significant stenosis is identified. CTA neck: 1. Patent carotid and vertebral arteries. No dissection, aneurysm, or significant stenosis by NASCET criteria. 2.  Nodules in bilateral lobes of thyroid measuring up to 2.6 cm on the right. Thyroid ultrasound is recommended on a nonemergent basis. CT perfusion head: By automated RAPID quantification: Infarct core 82 cc, ischemic penumbra 75 cc, infarct location in left MCA distribution. These results were called by telephone at the time of interpretation on 07/27/2017 at 9:23 pm to Dr. Amie [place] , who verbally acknowledged these results. Electronically Signed: By: Kristine Garbe M.D. On: 07/27/2017 21:39   Ct Cerebral Perfusion W Contrast  Addendum Date: 07/27/2017   ADDENDUM REPORT: 07/27/2017 21:47 CONTRAST:  Total of 200 cc Omnipaque 370  administered. Electronically Signed   By: Kristine Garbe M.D.   On: 07/27/2017 21:47   Result Date: 07/27/2017 CLINICAL DATA:  42 y/o  M; right-sided numbness, acute stroke. EXAM: CT ANGIOGRAPHY HEAD AND NECK CT PERFUSION BRAIN TECHNIQUE: Multidetector CT imaging of the head and neck was performed using the standard protocol during bolus administration of intravenous contrast. Multiplanar CT image reconstructions and MIPs were obtained to evaluate the vascular anatomy. Carotid stenosis measurements (when applicable) are obtained utilizing NASCET criteria, using the distal internal carotid diameter as the denominator. Multiphase CT imaging of the brain was performed following IV bolus contrast injection. Subsequent parametric perfusion maps were calculated using RAPID software. CONTRAST:  See addendum. COMPARISON:  07/27/2016 CT head FINDINGS: CTA NECK FINDINGS Aortic arch: Standard branching. Imaged portion shows no evidence of aneurysm or dissection. No significant stenosis of the major arch vessel origins. Right carotid system: No evidence of dissection, stenosis (50% or greater) or occlusion. Left carotid system: No evidence of dissection, stenosis (50% or greater) or occlusion. Vertebral arteries: Right dominant. No evidence of dissection, stenosis (50% or greater) or occlusion. Skeleton: Negative. Other neck: Nodules in bilateral lobes of thyroid measuring up to 2.6 cm on the right (series 7 image 149). Upper chest: Negative. Review of the MIP images confirms the above findings CTA HEAD FINDINGS Anterior circulation: Left proximal M1 occlusion with intermediate left MCA distribution collateralization. Otherwise no significant stenosis, proximal occlusion, aneurysm, or vascular malformation in the anterior circulation. Posterior circulation: No significant stenosis, proximal occlusion, aneurysm, or vascular malformation. Venous sinuses: As permitted by contrast timing, patent. Anatomic variants:  Bilateral fetal PCA. Small anterior communicating artery. Review of the MIP images confirms the above findings CT Brain Perfusion Findings: CBF (<30%) Volume: 98m Perfusion (Tmax>6.0s) volume: 1583mMismatch Volume: 7556mnfarction Location:Left MCA distribution IMPRESSION: CTA head: 1. Left M1 occlusion with intermediate left MCA distribution collateralization. 2. Otherwise patent anterior and posterior intracranial circulation. No additional large vessel occlusion, aneurysm, or significant stenosis is identified. CTA neck: 1. Patent carotid and vertebral arteries. No dissection, aneurysm, or significant stenosis by NASCET criteria. 2. Nodules in bilateral lobes of thyroid measuring up to 2.6 cm on the right. Thyroid ultrasound is recommended on a nonemergent basis. CT perfusion head: By automated RAPID quantification: Infarct core 82 cc, ischemic penumbra 75 cc, infarct location in left MCA distribution. These results were called by telephone at the time of interpretation on 07/27/2017 at 9:23 pm to Dr. ASHAmie Portlandwho verbally acknowledged these results. Electronically Signed: By: LanKristine GarbeD. On: 07/27/2017 21:39   Dg Chest Port 1 View  Result Date: 07/28/2017 CLINICAL DATA:  [age]ar old male status post intubation. EXAM: PORTABLE CHEST 1 VIEW COMPARISON:  None. FINDINGS: An enteric tube is seen with side-port in the region of the gastroesophageal junction and tip in the proximal stomach. Recommend further advancing of the tube into the  stomach by approximately 3-4 cm. A partially visualized structure with tip at the level of the T1 may represent the endotracheal tube. Clinical correlation is recommended. If an endotracheal tube is present recommend further advancing the tube by approximately 3 cm for optimal positioning. The lungs are clear. There is no pleural effusion or pneumothorax. The cardiac silhouette is within normal limits. No acute osseous pathology. IMPRESSION: 1. Enteric  tube with tip in the stomach and side-port in the region of the gastroesophageal junction. Recommend advancing the tube further into the stomach for optimal positioning. A partially visualized density over the T1 may represent the tip of the endotracheal tube. If an endotracheal tube is present recommend further advancing by approximately 3 cm for optimal positioning. 2. No acute cardiopulmonary process. Electronically Signed   By: Anner Crete M.D.   On: 07/28/2017 01:18   Ct Head Code Stroke Wo Contrast  Result Date: 07/27/2017 CLINICAL DATA:  Code stroke. 42 y/o M; right-sided weakness and facial droop. EXAM: CT HEAD WITHOUT CONTRAST TECHNIQUE: Contiguous axial images were obtained from the base of the skull through the vertex without intravenous contrast. COMPARISON:  None. FINDINGS: Brain: Faint lucency involving left insula and frontal operculum. No significant mass effect or hemorrhage at this time. No hydrocephalus, basilar cistern effacement, or extra-axial collection. Vascular: Dense left M1. Skull: Normal. Negative for fracture or focal lesion. Sinuses/Orbits: No acute finding. Other: None. ASPECTS [facility] Stroke Program Early CT Score) - Ganglionic level infarction (caudate, lentiform nuclei, internal capsule, insula, M1-M3 cortex): 5 - Supraganglionic infarction (M4-M6 cortex): 3 Total score (0-10 with 10 being normal): 8 IMPRESSION: 1. Left insula and frontal operculum lucency compatible with infarction. No hemorrhage or mass effect. 2. Left M1 hyperdensity, likely thrombus. 3. ASPECTS is 8 These results were called by telephone at the time of interpretation on 07/27/2017 at 9:10 pm to Dr. Rory Percy , who verbally acknowledged these results. Electronically Signed   By: Kristine Garbe M.D.   On: 07/27/2017 21:12   CT head 05/28/18 10am 1. Progressive left MCA territory infarct with further involvement of the left caudate head and lentiform nucleus. 2. Extensive nonhemorrhagic infarcts  involving the left temporal tip, left frontal operculum, left parietal lobe, and medial left frontal lobe, the distal left ACA territory. 3. Contrast extravasation compatible with perforation into the left sylvian fissure, the sulci over the convexity, and the interpeduncular notch. 4. Minimal midline shift. 5. Fluid in the sinuses likely secondary to intubation.  Transthoracic Echocardiogram - Left ventricle: The cavity size was normal. Wall thickness was   increased in a pattern of mild LVH. Systolic function was normal.   The estimated ejection fraction was in the range of 60% to 65%.   Wall motion was normal; there were no regional wall motion   abnormalities. Left ventricular diastolic function parameters   were normal. - Atrial septum: No defect or patent foramen ovale was identified.  Bilateral LE venous Dopplers - No evidence of deep vein thrombosis or baker's cysts bilaterally.   Ct Head Wo Contrast  Result Date: 07/29/2017 CLINICAL DATA:  Stroke. EXAM: CT HEAD WITHOUT CONTRAST TECHNIQUE: Contiguous axial images were obtained from the base of the skull through the vertex without intravenous contrast. COMPARISON:  MRI brain/12/19.  CT head 07/28/2017. FINDINGS: Brain: A large left MCA and ACA territory infarct is again noted. There is diffuse hypoattenuation involving affected areas of the left frontal and parietal lobe. The superior and anterior temporal lobe is involved. Contrast and hemorrhage within the  sylvian fissure and along the left MCA is again noted. No new hemorrhage is present. There is increasing mass effect with effacement of the left lateral ventricle and now 10 mm of midline shift. There is increasing prominence of the right temporal horn compatible with mass effect on the lateral ventricles. Effacement of the para mesencephalic cistern on the right suggest developing uncal herniation. Brainstem and cerebellum are unremarkable. Vascular: Hyperdense left MCA and branches  compatible with contrast and thrombus. Skull: Calvarium is intact. No focal lytic or blastic lesions are present. Sinuses/Orbits: Increased fluid is present in the left sphenoid sinus. There is mild mucosal thickening of the left maxillary sinus. Globes and orbits are within normal limits. IMPRESSION: 1. Evolving left MCA and ACA territory infarct increasing mass effect and midline shift, now measuring 10 mm. 2. Mild increasing dilation of the right temporal tip compatible with left-sided effacement. 3. Developing uncal herniation without significant downward herniation of the brainstem. 4. Subarachnoid contrast and hemorrhage again noted within the sylvian fissure. Electronically Signed   By: San Morelle M.D.   On: 07/29/2017 06:40   Mr Brain Wo Contrast  Result Date: 07/28/2017 CLINICAL DATA:  Follow-up LEFT-sided stroke. Status post tPA July 27, 2017. EXAM: MRI HEAD WITHOUT CONTRAST TECHNIQUE: Multiplanar, multiecho pulse sequences of the brain and surrounding structures were obtained without intravenous contrast. COMPARISON:  CT HEAD July 28, 2017 at 0852 hours FINDINGS: BRAIN: Confluent LEFT frontotemporal parietal and LEFT basal ganglia reduced diffusion with involvement of the mesial LEFT parietal and frontal lobes. Low ADC values. Susceptibility artifact within LEFT sylvian fissure and LEFT frontal parietal sulci. No intraparenchymal hemorrhage. Worsening 8 mm LEFT-to-RIGHT midline shift, increased from 2 mm. Partially effaced LEFT lateral ventricle with early RIGHT lateral ventricle entrapment. 11 mm T2 bright simple cyst within or adjacent to splenium of corpus callosum. No significant extra-axial fluid accumulation. VASCULAR: Normal major intracranial vascular flow voids present at skull base. SKULL AND UPPER CERVICAL SPINE: No abnormal sellar expansion. No suspicious calvarial bone marrow signal. Craniocervical junction maintained. SINUSES/ORBITS: Secretions layering in scratches at  layering secretions in paranasal sinuses. Life-support lines in place. Mastoid air cells are well aerated. The included ocular globes and orbital contents are non-suspicious. OTHER: None. IMPRESSION: 1. Evolving acute large LEFT MCA territory nonhemorrhagic infarct. Evolving acute distal LEFT ACA territory nonhemorrhagic infarct. 2. Worsening 8 mm LEFT-to-RIGHT midline shift with early RIGHT ventricle entrapment. 3. Susceptibility artifact LEFT sylvian fissure and LEFT sulci corresponding to known extra-axial hemorrhage. Acute findings text paged to [place] via AMION secure system on 07/28/2017 at 10:37 pm. Electronically Signed   By: Elon Alas M.D.   On: 07/28/2017 22:38    PHYSICAL EXAM  Temp:  [98.2 F (36.8 C)-99.2 F (37.3 C)] 98.5 F (36.9 C) (01/13 1153) Pulse Rate:  [49-92] 56 (01/13 1200) Resp:  [14-19] 17 (01/13 1200) BP: (110-149)/(73-98) 149/98 (01/13 1200) SpO2:  [99 %-100 %] 100 % (01/13 1200) FiO2 (%):  [40 %] 40 % (01/13 0813) Weight:  [194 lb 10.7 oz (88.3 kg)] 194 lb 10.7 oz (88.3 kg) (01/13 0449)  General - Well nourished, well developed, intubated.  Ophthalmologic - Fundi not visualized due to small pupils.  Cardiovascular - Regular rate and rhythm.  Neuro - intubated, not on sedation, lethargic, not open eyes to voice, PERRLA, left gaze preference, but able to briefly to cross midline, blinking to visual threat on the left, but not able to the left.  Able to follow all simple commands.  Facial  symmetry not able to test due to ET tube.  Left upper extremity and lower extremity at least 4/5, right upper and lower extremity dense hemiplegia.  DTR 1+, no Babinski.  Sensation and coordination not cooperative.  Gait not tested.   ASSESSMENT/PLAN Mr. Jaiceon Collister is a 42 y.o. male with history of HTN, OSA presenting with right-sided weakness, left gaze and right neglect. He receive IV t-PA.  However, unsuccessful for mechanical thrombectomy with complication of left  SAH.  Stroke: Left large MCA infarct status post TPA with unsuccessful IR attempt causing left SAH.  Resultant  left gaze, right hemiplegia, right neglect  CT head left MCA  hyperdense  CT head and neck left MCA occlusion  DSA with unsuccessful IR attempt causing left SAH  CT repeat showed left MCA large infarct with left stable SAH and stable subtle midline shift  MRI head large left MCA and ACA infarct with midline shift  2D Echo - EF 60-65%  LE venous Doppler no DVT  Consider TEE later when stable  LDL NTC due to high TG  HgbA1c - 5.2  UDS negative  VTE prophylaxis -SCDs Diet NPO time specified  No antithrombotic prior to admission, now on No antithrombotic due to [facility]  Ongoing aggressive stroke risk factor management  Therapy recommendations:  pending  Disposition:  Pending  Cerebral edema  CT head showed large left MCA infarct  MRI large left MCA and ACA infarcts with midline shift  CT repeat - increasing midline shift  on 3% saline  Na 144 in am  Sodium goal 150 and 155  23.4% saline given x 2  Neurosurgery on board - hemi craniectomy today  Hypertension  Stable  Hyperlipidemia  Home meds: None  LDL NTC due to high TG, goal < 70  TG 417  Consider statin and zetia once stable  Other Stroke Risk Factors  Obstructive sleep apnea  Other Active Problems  intubated  Hospital day # 2  This patient is critically ill due to large left MCA infarct, SAH, cerebral edema and at significant risk of neurological worsening, death form brain herniation, seizure, heart failure. This patient's care requires constant monitoring of vital signs, hemodynamics, respiratory and cardiac monitoring, review of multiple databases, neurological assessment, discussion with family, other specialists and medical decision making of high complexity. I had long discussion with wife at bedside, updated pt current condition, treatment plan and potential prognosis. They  expressed understanding and appreciation. I also discussed with Dr. Kathyrn Sheriff for hemicrani. I spent 35 minutes of neurocritical care time in the care of this patient.  Rosalin Hawking, MD PhD Stroke Neurology 07/29/2017 12:36 PM   To contact Stroke Continuity provider, please refer to [email or web address] After hours, contact General Neurology

## 2017-07-29 NOTE — Transfer of Care (Signed)
Immediate Anesthesia Transfer of Care Note  Patient: Jeremy [name]  Procedure(s) Performed: LEFT HEMI- CRANIECTOMY WITH PLACEMENT OF BONE FLAP IN ABDOMEN (Left Head)  Patient Location: NICU  Anesthesia Type:General  Level of Consciousness: Patient remains intubated per anesthesia plan  Airway & Oxygen Therapy: Patient remains intubated per anesthesia plan  Post-op Assessment: Report given to RN and Post -op Vital signs reviewed and stable  Post vital signs: Reviewed and stable  Last Vitals:  Vitals:   07/29/17 1153 07/29/17 1200  BP:  (!) 149/98  Pulse:  (!) 56  Resp:  17  Temp: 36.9 C   SpO2:  100%    Last Pain:  Vitals:   07/29/17 1153  TempSrc: Axillary  PainSc:          Complications: No apparent anesthesia complications

## 2017-07-29 NOTE — Progress Notes (Signed)
Notified pharmacy for 23%

## 2017-07-29 NOTE — Progress Notes (Signed)
Pt taken to OR by CRNA staff. Bedside report given to CRNA.

## 2017-07-29 NOTE — Anesthesia Postprocedure Evaluation (Signed)
Anesthesia Post Note  Patient: Kee Rath  Procedure(s) Performed: LEFT HEMI- CRANIECTOMY WITH PLACEMENT OF BONE FLAP IN ABDOMEN (Left Head)     Patient location during evaluation: SICU Anesthesia Type: General Level of consciousness: sedated Pain management: pain level controlled Vital Signs Assessment: post-procedure vital signs reviewed and stable Respiratory status: patient remains intubated per anesthesia plan Cardiovascular status: stable Postop Assessment: no apparent nausea or vomiting Anesthetic complications: no    Last Vitals:  Vitals:   07/29/17 1445 07/29/17 1500  BP: (!) 145/102 (!) 131/92  Pulse: 65 (!) 58  Resp: 15 14  Temp:    SpO2: 100% 100%    Last Pain:  Vitals:   07/29/17 1435  TempSrc: Axillary  PainSc:                  Ramere Downs,W. EDMOND

## 2017-07-29 NOTE — Progress Notes (Signed)
Pt placed back on full vent support after returning to 4N31 from OR.  Pt's vitals are stable at this time. RN at bedside.

## 2017-07-29 NOTE — Progress Notes (Signed)
Pt back to room at about 1420. VSS.

## 2017-07-29 NOTE — Progress Notes (Signed)
PULMONARY / CRITICAL CARE MEDICINE   Name: Jeremy [name] MRN: [id number] DOB: [birth year]    ADMISSION DATE:  07/27/2017 CONSULTATION DATE:  07/27/2017   REFERRING MD:  Dr. Wilford CornerArora  CHIEF COMPLAINT:  Vent management post stroke   SUBJECTIVE:  -Interim events: patient has returned from OR after undergoing left hemicraniectomy. Remains intubated. Moves L UE and L LE on command. Dense R hemiparesis.  HISTORY OF PRESENT ILLNESS:   42 y/o male with OSA, HTN was admitted on 1/11 with abrupt onset R facial droop.  He was found to have evidence of a left MCA stroke. He was given IV TP and went to IR for thrombectomy.    PAST MEDICAL HISTORY :  He  has a past medical history of Anxiety, Asthma, Depression, Deviated septum, Dyspnea, Hypertension, and Sleep apnea.   VITAL SIGNS: BP (!) 131/92   Pulse 85   Temp 98.7 F (37.1 C) (Axillary)   Resp 17   Ht 5\' 10"  (1.778 m)   Wt 88.3 kg (194 lb 10.7 oz)   SpO2 100%   BMI 27.93 kg/m   HEMODYNAMICS:    VENTILATOR SETTINGS: Vent Mode: PRVC FiO2 (%):  [40 %] 40 % Set Rate:  [14 bmp] 14 bmp Vt Set:  [580 mL] 580 mL PEEP:  [5 cmH20] 5 cmH20 Plateau Pressure:  [12 cmH20-15 cmH20] 14 cmH20  INTAKE / OUTPUT: I/O last 3 completed shifts: In: 1880.2 [I.V.:1572.2; Blood:308] Out: 2015 [Urine:1915; Blood:100]  PHYSICAL EXAMINATION:  General:  In bed on vent HENT: NCAT ETT in place PULM: CTA B, vent supported breathing CV: RRR, no mgr GI: BS+, soft, nontender MSK: normal bulk and tone Neuro: R dense hemiparesis   LABS:  BMET Recent Labs  Lab 07/27/17 2055 07/27/17 2103 07/28/17 0259  07/28/17 2303 07/29/17 0503 07/29/17 1122  NA 136 138 136   < > 139 144 147*  K 4.1 4.1 3.9  --   --  3.8  --   CL 99* 99* 105  --   --  112*  --   CO2 26  --  24  --   --  23  --   BUN 22* 25* 18  --   --  16  --   CREATININE 1.29* 1.30* 0.95  --   --  0.83  --   GLUCOSE 129* 125* 123*  --   --  134*  --    < > = values in this interval not  displayed.    Electrolytes Recent Labs  Lab 07/27/17 2055 07/28/17 0259 07/29/17 0503  CALCIUM 9.2 8.2* 8.0*  MG  --  2.2  --   PHOS  --  3.2  --     CBC Recent Labs  Lab 07/27/17 2055 07/27/17 2103 07/28/17 0259 07/29/17 0503  WBC 6.6  --  8.0 9.7  HGB 16.1 16.3 13.2 11.4*  HCT 45.5 48.0 37.9* 34.0*  PLT 228  --  203 185    Coag's Recent Labs  Lab 07/27/17 2055  APTT 25  INR 0.98    Sepsis Markers No results for input(s): LATICACIDVEN, PROCALCITON, O2SATVEN in the last 168 hours.  ABG Recent Labs  Lab 07/28/17 0137  PHART 7.341*  PCO2ART 43.2  PO2ART 170.0*    Liver Enzymes Recent Labs  Lab 07/27/17 2055  AST 48*  ALT 98*  ALKPHOS 40  BILITOT 1.1  ALBUMIN 4.2    Cardiac Enzymes No results for input(s): TROPONINI, PROBNP in the last 168 hours.  Glucose No results for input(s): GLUCAP in the last 168 hours.  Imaging Ct Head Wo Contrast  Result Date: 07/29/2017 CLINICAL DATA:  Stroke. EXAM: CT HEAD WITHOUT CONTRAST TECHNIQUE: Contiguous axial images were obtained from the base of the skull through the vertex without intravenous contrast. COMPARISON:  MRI brain/12/19.  CT head 07/28/2017. FINDINGS: Brain: A large left MCA and ACA territory infarct is again noted. There is diffuse hypoattenuation involving affected areas of the left frontal and parietal lobe. The superior and anterior temporal lobe is involved. Contrast and hemorrhage within the sylvian fissure and along the left MCA is again noted. No new hemorrhage is present. There is increasing mass effect with effacement of the left lateral ventricle and now 10 mm of midline shift. There is increasing prominence of the right temporal horn compatible with mass effect on the lateral ventricles. Effacement of the para mesencephalic cistern on the right suggest developing uncal herniation. Brainstem and cerebellum are unremarkable. Vascular: Hyperdense left MCA and branches compatible with contrast and  thrombus. Skull: Calvarium is intact. No focal lytic or blastic lesions are present. Sinuses/Orbits: Increased fluid is present in the left sphenoid sinus. There is mild mucosal thickening of the left maxillary sinus. Globes and orbits are within normal limits. IMPRESSION: 1. Evolving left MCA and ACA territory infarct increasing mass effect and midline shift, now measuring 10 mm. 2. Mild increasing dilation of the right temporal tip compatible with left-sided effacement. 3. Developing uncal herniation without significant downward herniation of the brainstem. 4. Subarachnoid contrast and hemorrhage again noted within the sylvian fissure. Electronically Signed   By: Marin Roberts M.D.   On: 07/29/2017 06:40   Mr Brain Wo Contrast  Result Date: 07/28/2017 CLINICAL DATA:  Follow-up LEFT-sided stroke. Status post tPA July 27, 2017. EXAM: MRI HEAD WITHOUT CONTRAST TECHNIQUE: Multiplanar, multiecho pulse sequences of the brain and surrounding structures were obtained without intravenous contrast. COMPARISON:  CT HEAD July 28, 2017 at 0852 hours FINDINGS: BRAIN: Confluent LEFT frontotemporal parietal and LEFT basal ganglia reduced diffusion with involvement of the mesial LEFT parietal and frontal lobes. Low ADC values. Susceptibility artifact within LEFT sylvian fissure and LEFT frontal parietal sulci. No intraparenchymal hemorrhage. Worsening 8 mm LEFT-to-RIGHT midline shift, increased from 2 mm. Partially effaced LEFT lateral ventricle with early RIGHT lateral ventricle entrapment. 11 mm T2 bright simple cyst within or adjacent to splenium of corpus callosum. No significant extra-axial fluid accumulation. VASCULAR: Normal major intracranial vascular flow voids present at skull base. SKULL AND UPPER CERVICAL SPINE: No abnormal sellar expansion. No suspicious calvarial bone marrow signal. Craniocervical junction maintained. SINUSES/ORBITS: Secretions layering in scratches at layering secretions in  paranasal sinuses. Life-support lines in place. Mastoid air cells are well aerated. The included ocular globes and orbital contents are non-suspicious. OTHER: None. IMPRESSION: 1. Evolving acute large LEFT MCA territory nonhemorrhagic infarct. Evolving acute distal LEFT ACA territory nonhemorrhagic infarct. 2. Worsening 8 mm LEFT-to-RIGHT midline shift with early RIGHT ventricle entrapment. 3. Susceptibility artifact LEFT sylvian fissure and LEFT sulci corresponding to known extra-axial hemorrhage. Acute findings text paged to Dr.ASHISH ARORA via [employer] secure system on 07/28/2017 at 10:37 pm. Electronically Signed   By: Awilda Metro M.D.   On: 07/28/2017 22:38     STUDIES:  1/12 CT head :  CT head : left MCA distribution infarct, some extravasation of contrast in sylvian fissure, small volume subarachnoid hemorrhage, fluid in sinuses 1/11 CT angio neck/head> L M1 MCA occlusion 1/11 CT head L MCA sign  CULTURES:  ANTIBIOTICS:   SIGNIFICANT EVENTS: 1/11 admission for R MCA stroke: IV tPA and failed IR Thrombectomy 1/13 L hemicraniectomy  LINES/TUBES: 1/11 ETT >    DISCUSSION: 42 yo male with an acute L MCA stroke, had IR thrombectomy post TPA. Some small subarachnoid bleeding and extravasation post thromobectomy.  CT imaging from 1/12 shows evolving signs of MCA stroke.   ASSESSMENT / PLAN:  PULMONARY A: Acute respiratory failure with hypoxemia OSA P: Full mechanical vent support VAP prevention Daily WUA/SBT  CARDIOVASCULAR A: Acute MCA stroke> embolic? P: Tele Monitor hemodynamics  RENAL A: No acute issues P: Monitor BMET and UOP Replace electrolytes as needed   GASTROINTESTINAL A: No acute issues P: Start tube feeding Pantoprazole for stress ulcer prophylaxis   HEMATOLOGIC A: No acute issues P: Monitor for bleeding  INFECTIOUS A: No acute issues P: Monitor for fever  ENDOCRINE A: No acute issues P: Glucose goals per stroke orderset  NEUROLOGIC A:  Acute L MCA stroke Vessel perforation post thrombectomy At risk for brain edema P: RASS goal: 0 to -1 Fentanyl prn Propofol gtt per PAD protocol Post stroke care per Neurology, orderset per neurology  FAMILY  - Updates:  - Inter-disciplinary family meet or Palliative Care meeting due by:  day 7   Marcelle Smiling, MD  [facility] Pager: [phone]  Pulmonary and Critical Care Medicine [facility] Pager: [phone]  07/29/2017, 5:42 PM

## 2017-07-29 NOTE — Plan of Care (Signed)
Patient able to use hand gestures to communicate needs.

## 2017-07-29 NOTE — Anesthesia Preprocedure Evaluation (Addendum)
Anesthesia Evaluation  Patient identified by MRN, date of birth, ID band Patient unresponsive    Reviewed: Allergy & Precautions, H&P , NPO status , Patient's Chart, lab work & pertinent test results  Airway Mallampati: Intubated       Dental no notable dental hx. (+) Teeth Intact, Dental Advisory Given   Pulmonary asthma , sleep apnea ,  Intubated and sedated   Pulmonary exam normal breath sounds clear to auscultation       Cardiovascular hypertension, Pt. on medications  Rhythm:Regular Rate:Normal     Neuro/Psych Anxiety Depression Intracranial HTN CVA, Residual Symptoms    GI/Hepatic negative GI ROS, Neg liver ROS,   Endo/Other  negative endocrine ROS  Renal/GU negative Renal ROS  negative genitourinary   Musculoskeletal   Abdominal   Peds  Hematology  (+) REFUSES BLOOD PRODUCTS,   Anesthesia Other Findings   Reproductive/Obstetrics negative OB ROS                           Anesthesia Physical Anesthesia Plan  ASA: IV  Anesthesia Plan: General   Post-op Pain Management:    Induction: Intravenous  PONV Risk Score and Plan: 3 and Ondansetron, Dexamethasone and Midazolam  Airway Management Planned: Oral ETT  Additional Equipment:   Intra-op Plan:   Post-operative Plan: Post-operative intubation/ventilation  Informed Consent: I have reviewed the patients History and Physical, chart, labs and discussed the procedure including the risks, benefits and alternatives for the proposed anesthesia with the patient or authorized representative who has indicated his/her understanding and acceptance.   Dental advisory given  Plan Discussed with: CRNA  Anesthesia Plan Comments:        Anesthesia Quick Evaluation

## 2017-07-30 ENCOUNTER — Encounter (HOSPITAL_COMMUNITY): Payer: Self-pay | Admitting: Interventional Radiology

## 2017-07-30 DIAGNOSIS — Z9889 Other specified postprocedural states: Secondary | ICD-10-CM

## 2017-07-30 DIAGNOSIS — Z9911 Dependence on respirator [ventilator] status: Secondary | ICD-10-CM

## 2017-07-30 LAB — BASIC METABOLIC PANEL
Anion gap: 6 (ref 5–15)
BUN: 13 mg/dL (ref 6–20)
CO2: 25 mmol/L (ref 22–32)
Calcium: 8.1 mg/dL — ABNORMAL LOW (ref 8.9–10.3)
Chloride: 119 mmol/L — ABNORMAL HIGH (ref 101–111)
Creatinine, Ser: 0.91 mg/dL (ref 0.61–1.24)
GFR calc Af Amer: >60 mL/min (ref >60)
GFR calc non Af Amer: >60 mL/min (ref >60)
GLUCOSE: 149 mg/dL — AB (ref 65–99)
POTASSIUM: 3.6 mmol/L (ref 3.5–5.1)
Sodium: 150 mmol/L — ABNORMAL HIGH (ref 135–145)

## 2017-07-30 LAB — TYPE AND SCREEN
ABO/RH(D): B POS
Antibody Screen: NEGATIVE
UNIT DIVISION: 0
Unit division: 0

## 2017-07-30 LAB — CBC
HEMATOCRIT: 31.2 % — AB (ref 39.0–52.0)
HEMOGLOBIN: 10.5 g/dL — AB (ref 13.0–17.0)
MCH: 31.3 pg (ref 26.0–34.0)
MCHC: 33.7 g/dL (ref 30.0–36.0)
MCV: 93.1 fL (ref 78.0–100.0)
Platelets: 179 10*3/uL (ref 150–400)
RBC: 3.35 MIL/uL — AB (ref 4.22–5.81)
RDW: 13.6 % (ref 11.5–15.5)
WBC: 11.2 10*3/uL — AB (ref 4.0–10.5)

## 2017-07-30 LAB — URINALYSIS, ROUTINE W REFLEX MICROSCOPIC
BILIRUBIN URINE: NEGATIVE
Glucose, UA: 50 mg/dL — AB
Hgb urine dipstick: NEGATIVE
Ketones, ur: NEGATIVE mg/dL
Leukocytes, UA: NEGATIVE
NITRITE: NEGATIVE
PROTEIN: NEGATIVE mg/dL
SPECIFIC GRAVITY, URINE: 1.026 (ref 1.005–1.030)
pH: 5 (ref 5.0–8.0)

## 2017-07-30 LAB — SODIUM
SODIUM: 150 mmol/L — AB (ref 135–145)
SODIUM: 152 mmol/L — AB (ref 135–145)
Sodium: 151 mmol/L — ABNORMAL HIGH (ref 135–145)

## 2017-07-30 LAB — BPAM RBC
BLOOD PRODUCT EXPIRATION DATE: 201901312359
Blood Product Expiration Date: 201901312359
UNIT TYPE AND RH: 7300
Unit Type and Rh: 7300

## 2017-07-30 LAB — ANTINUCLEAR ANTIBODIES, IFA: ANTINUCLEAR ANTIBODIES, IFA: NEGATIVE

## 2017-07-30 LAB — HOMOCYSTEINE: Homocysteine: 8.4 umol/L (ref 0.0–15.0)

## 2017-07-30 MED ORDER — ACETAMINOPHEN 160 MG/5ML PO SOLN
650.0000 mg | ORAL | Status: DC | PRN
  Administered 2017-07-30 – 2017-08-02 (×11): 650 mg
  Filled 2017-07-30 (×10): qty 20.3

## 2017-07-30 MED ORDER — VITAL HIGH PROTEIN PO LIQD
1000.0000 mL | ORAL | Status: DC
  Administered 2017-07-30: 1000 mL

## 2017-07-30 MED ORDER — ACETAMINOPHEN 325 MG PO TABS
650.0000 mg | ORAL_TABLET | ORAL | Status: DC | PRN
  Administered 2017-07-30 – 2017-08-03 (×3): 650 mg via ORAL
  Filled 2017-07-30 (×4): qty 2

## 2017-07-30 MED ORDER — SODIUM CHLORIDE 0.9% FLUSH: 10.0000 mL | INTRAVENOUS | Status: DC | PRN

## 2017-07-30 MED ORDER — PANTOPRAZOLE SODIUM 40 MG PO PACK
40.0000 mg | PACK | Freq: Every day | ORAL | Status: DC
  Administered 2017-07-30 – 2017-08-03 (×4): 40 mg
  Filled 2017-07-30 (×4): qty 20

## 2017-07-30 MED ORDER — PANTOPRAZOLE SODIUM 40 MG PO TBEC: 40.0000 mg | DELAYED_RELEASE_TABLET | Freq: Every day | ORAL | Status: DC

## 2017-07-30 MED ORDER — CHLORHEXIDINE GLUCONATE CLOTH 2 % EX PADS
6.0000 | MEDICATED_PAD | Freq: Every day | CUTANEOUS | Status: DC
  Administered 2017-07-30 – 2017-08-06 (×7): 6 via TOPICAL

## 2017-07-30 MED ORDER — EZETIMIBE 10 MG PO TABS
10.0000 mg | ORAL_TABLET | Freq: Every day | ORAL | Status: DC
  Administered 2017-07-30 – 2017-08-07 (×8): 10 mg via ORAL
  Filled 2017-07-30 (×8): qty 1

## 2017-07-30 MED ORDER — ACETAMINOPHEN 650 MG RE SUPP: 650.0000 mg | RECTAL | Status: DC | PRN

## 2017-07-30 MED ORDER — ATORVASTATIN CALCIUM 40 MG PO TABS
40.0000 mg | ORAL_TABLET | Freq: Every day | ORAL | Status: DC
  Administered 2017-07-30 – 2017-08-07 (×8): 40 mg via ORAL
  Filled 2017-07-30 (×8): qty 1

## 2017-07-30 MED ORDER — SODIUM CHLORIDE 0.9% FLUSH
10.0000 mL | Freq: Two times a day (BID) | INTRAVENOUS | Status: DC
  Administered 2017-07-31 (×2): 10 mL
  Administered 2017-08-01: 20 mL
  Administered 2017-08-01 – 2017-08-02 (×2): 10 mL
  Administered 2017-08-02: 20 mL
  Administered 2017-08-03 – 2017-08-04 (×3): 10 mL
  Administered 2017-08-04: 20 mL
  Administered 2017-08-05 (×2): 10 mL

## 2017-07-30 MED ORDER — PRO-STAT SUGAR FREE PO LIQD
30.0000 mL | Freq: Every day | ORAL | Status: DC
  Administered 2017-07-31 – 2017-08-02 (×3): 30 mL
  Filled 2017-07-30 (×3): qty 30

## 2017-07-30 MED ORDER — VITAL AF 1.2 CAL PO LIQD
1000.0000 mL | ORAL | Status: DC
  Administered 2017-07-30 – 2017-08-02 (×4): 1000 mL

## 2017-07-30 MED ORDER — PRO-STAT SUGAR FREE PO LIQD
30.0000 mL | Freq: Two times a day (BID) | ORAL | Status: DC
  Filled 2017-07-30: qty 30

## 2017-07-30 NOTE — Progress Notes (Signed)
Pt placed on cooling blanket as soon as it was received from portable. Pt monitored closely for shivering.

## 2017-07-30 NOTE — Progress Notes (Signed)
Orthopedic Tech Progress Note Patient Details:  Cletus GashJao Co 11-13-1975 [id number]  Ortho Devices Type of Ortho Device: Prafo boot/shoe Ortho Device/Splint Location: rle Ortho Device/Splint Interventions: Application   Post Interventions Patient Tolerated: Well Instructions Provided: Care of device   Nikki DomCrawford, Norina Cowper 07/30/2017, 9:03 AM

## 2017-07-30 NOTE — Progress Notes (Signed)
Patient ID: Jeremy [name], male   DOB: April 13, 1976, 42 y.o.   MRN: [id number]    Referring Physician(s): Dr. Marvel Plan  Supervising Physician: Julieanne Cotton  Patient Status: [facility] - In-pt  Chief Complaint: CVA  Subjective: Patient sedated on vent.  Per mom patient did follow some commands with neuro this morning.  Allergies: Blood-group specific substance  Medications: Prior to Admission medications   Medication Sig Start Date End Date Taking? Authorizing Provider  spironolactone (ALDACTONE) 100 MG tablet Take 100 mg by mouth daily. 07/20/17  Yes [provider]  amLODipine (NORVASC) 10 MG tablet Take 10 mg by mouth at bedtime. 10/20/16   [provider]  KLOR-CON M20 20 MEQ tablet Take 40 mEq by mouth daily. 01/01/17   [provider]  oxyCODONE-acetaminophen (ROXICET) 5-325 MG tablet Take 1-2 tablets by mouth every 4 (four) hours as needed for severe pain. 03/06/17   Linus Salmons, MD  sulfamethoxazole-trimethoprim (BACTRIM DS,SEPTRA DS) 800-160 MG tablet Take 1 tablet by mouth 2 (two) times daily. Patient not taking: Reported on 07/28/2017 03/06/17   Linus Salmons, MD  triamterene-hydrochlorothiazide (MAXZIDE-25) 37.5-25 MG tablet Take 1 tablet by mouth daily. 01/04/17   [provider]    Vital Signs: BP 124/82   Pulse 65   Temp 99.7 F (37.6 C) (Axillary)   Resp 20   Ht 5\' 10"  (1.778 m)   Wt 188 lb 4.4 oz (85.4 kg)   SpO2 100%   BMI 27.01 kg/m   Physical Exam: Gen: no distress, on vent, sedated Skin: R CFA site is c/d/i.  Imaging: Ct Angio Head W Or Wo Contrast  Addendum Date: 07/27/2017   ADDENDUM REPORT: 07/27/2017 21:47 CONTRAST:  Total of 200 cc Omnipaque 370 administered. Electronically Signed   By: Mitzi Hansen M.D.   On: 07/27/2017 21:47   Result Date: 07/27/2017 CLINICAL DATA:  42 y/o  M; right-sided numbness, acute stroke. EXAM: CT ANGIOGRAPHY HEAD AND NECK CT PERFUSION BRAIN TECHNIQUE: Multidetector CT  imaging of the head and neck was performed using the standard protocol during bolus administration of intravenous contrast. Multiplanar CT image reconstructions and MIPs were obtained to evaluate the vascular anatomy. Carotid stenosis measurements (when applicable) are obtained utilizing NASCET criteria, using the distal internal carotid diameter as the denominator. Multiphase CT imaging of the brain was performed following IV bolus contrast injection. Subsequent parametric perfusion maps were calculated using RAPID software. CONTRAST:  See addendum. COMPARISON:  07/27/2016 CT head FINDINGS: CTA NECK FINDINGS Aortic arch: Standard branching. Imaged portion shows no evidence of aneurysm or dissection. No significant stenosis of the major arch vessel origins. Right carotid system: No evidence of dissection, stenosis (50% or greater) or occlusion. Left carotid system: No evidence of dissection, stenosis (50% or greater) or occlusion. Vertebral arteries: Right dominant. No evidence of dissection, stenosis (50% or greater) or occlusion. Skeleton: Negative. Other neck: Nodules in bilateral lobes of thyroid measuring up to 2.6 cm on the right (series 7 image 149). Upper chest: Negative. Review of the MIP images confirms the above findings CTA HEAD FINDINGS Anterior circulation: Left proximal M1 occlusion with intermediate left MCA distribution collateralization. Otherwise no significant stenosis, proximal occlusion, aneurysm, or vascular malformation in the anterior circulation. Posterior circulation: No significant stenosis, proximal occlusion, aneurysm, or vascular malformation. Venous sinuses: As permitted by contrast timing, patent. Anatomic variants: Bilateral fetal PCA. Small anterior communicating artery. Review of the MIP images confirms the above findings CT Brain Perfusion Findings: CBF (<30%) Volume: 82mL Perfusion (Tmax>6.0s) volume:  Mismatch Volume: 75mL Infarction Location:Left MCA distribution  IMPRESSION: CTA head: 1. Left M1 occlusion with intermediate left MCA distribution collateralization. 2. Otherwise patent anterior and posterior intracranial circulation. No additional large vessel occlusion, aneurysm, or significant stenosis is identified. CTA neck: 1. Patent carotid and vertebral arteries. No dissection, aneurysm, or significant stenosis by NASCET criteria. 2. Nodules in bilateral lobes of thyroid measuring up to 2.6 cm on the right. Thyroid ultrasound is recommended on a nonemergent basis. CT perfusion head: By automated RAPID quantification: Infarct core 82 cc, ischemic penumbra 75 cc, infarct location in left MCA distribution. These results were called by telephone at the time of interpretation on 07/27/2017 at 9:23 pm to Dr. Milon Dikes , who verbally acknowledged these results. Electronically Signed: By: Mitzi Hansen M.D. On: 07/27/2017 21:39   Ct Head Wo Contrast  Result Date: 07/29/2017 CLINICAL DATA:  Stroke. EXAM: CT HEAD WITHOUT CONTRAST TECHNIQUE: Contiguous axial images were obtained from the base of the skull through the vertex without intravenous contrast. COMPARISON:  MRI brain/12/19.  CT head 07/28/2017. FINDINGS: Brain: A large left MCA and ACA territory infarct is again noted. There is diffuse hypoattenuation involving affected areas of the left frontal and parietal lobe. The superior and anterior temporal lobe is involved. Contrast and hemorrhage within the sylvian fissure and along the left MCA is again noted. No new hemorrhage is present. There is increasing mass effect with effacement of the left lateral ventricle and now 10 mm of midline shift. There is increasing prominence of the right temporal horn compatible with mass effect on the lateral ventricles. Effacement of the para mesencephalic cistern on the right suggest developing uncal herniation. Brainstem and cerebellum are unremarkable. Vascular: Hyperdense left MCA and branches compatible with contrast  and thrombus. Skull: Calvarium is intact. No focal lytic or blastic lesions are present. Sinuses/Orbits: Increased fluid is present in the left sphenoid sinus. There is mild mucosal thickening of the left maxillary sinus. Globes and orbits are within normal limits. IMPRESSION: 1. Evolving left MCA and ACA territory infarct increasing mass effect and midline shift, now measuring 10 mm. 2. Mild increasing dilation of the right temporal tip compatible with left-sided effacement. 3. Developing uncal herniation without significant downward herniation of the brainstem. 4. Subarachnoid contrast and hemorrhage again noted within the sylvian fissure. Electronically Signed   By: Marin Roberts M.D.   On: 07/29/2017 06:40   Ct Head Wo Contrast  Result Date: 07/28/2017 CLINICAL DATA:  Left MCA territory infarct. Vascular perforation during attempted revascularization. EXAM: CT HEAD WITHOUT CONTRAST TECHNIQUE: Contiguous axial images were obtained from the base of the skull through the vertex without intravenous contrast. COMPARISON:  CT head without contrast from the same day at 3:27 a.m. FINDINGS: Brain: A ball vein large left MCA territory infarct is again noted. There is progressive hypoattenuation involving the left caudate head and lentiform nucleus. Extensive distal left frontal lobe territory involves the cingulate gyrus. Super ganglionic MCA territory infarct has progressed. There is significant involvement of the left temporal tip. Contrast extravasation into the sylvian fissure and interpeduncular notch is again noted. No new hemorrhage is present. Brainstem and cerebellum are normal. Midline shift measures 2 mm, without significant change. Vascular: Hyperdense left MCA compatible with occlusion. Skull: Calvarium is intact. No focal lytic or blastic lesions are present. Sinuses/Orbits: Fluid levels are present in the left maxillary sinus and left sphenoid sinus. There is some fluid in left ethmoid air cells.  The remaining paranasal sinuses and mastoid air cells  are clear. Globes and orbits are within normal limits. IMPRESSION: 1. Progressive left MCA territory infarct with further involvement of the left caudate head and lentiform nucleus. 2. Extensive nonhemorrhagic infarcts involving the left temporal tip, left frontal operculum, left parietal lobe, and medial left frontal lobe, the distal left ACA territory. 3. Contrast extravasation compatible with perforation into the left sylvian fissure, the sulci over the convexity, and the interpeduncular notch. 4. Minimal midline shift. 5. Fluid in the sinuses likely secondary to intubation. Electronically Signed   By: Marin Roberts M.D.   On: 07/28/2017 10:55   Ct Head Wo Contrast  Result Date: 07/28/2017 CLINICAL DATA:  Follow-up suspected LEFT middle cerebral artery extravasation during incomplete thrombectomy. EXAM: CT HEAD WITHOUT CONTRAST TECHNIQUE: Contiguous axial images were obtained from the base of the skull through the vertex without intravenous contrast. COMPARISON:  Multiple CT HEAD May 27, 2018 FINDINGS: BRAIN: Increased density LEFT MCA distribution extending into LEFT insula and LEFT frontoparietal sulci measuring to 91 Hounsfield units. Faint density interpeduncular cistern measuring 50 Hounsfield units. Confluent LEFT frontotemporal parietal hypodensity extending to LEFT basal ganglia. No convincing evidence of intraparenchymal hemorrhage. Regional LEFT cerebrum mass effect without midline shift. No hydrocephalus. Basal cisterns are patent. 1 cm cyst in caudal aspect splenium of corpus callosum versus pineal cyst, less likely. VASCULAR: Dense LEFT MCA (55 Hounsfield units). New density along anterior cerebral artery distribution. SKULL/SOFT TISSUES: No skull fracture. No significant soft tissue swelling. ORBITS/SINUSES: The included ocular globes and orbital contents are normal.Mild paranasal sinusitis. Life-support lines in place. Mastoid  air cells are well aerated. OTHER: None. IMPRESSION: 1. Evolving acute large LEFT MCA territory infarct without hemorrhagic conversion. 2. Postprocedural extra-axial contrast extravasation in addition to probable contrast staining and small volume subarachnoid hemorrhage. 3. New density anterior cerebral artery concerning for thromboembolism. Critical Value/emergent results were called by telephone at the time of interpretation on 07/28/2017 at 4:00 am to Dr. Milon Dikes , who verbally acknowledged these results. Electronically Signed   By: Awilda Metro M.D.   On: 07/28/2017 04:16   Ct Angio Neck W Or Wo Contrast  Addendum Date: 07/27/2017   ADDENDUM REPORT: 07/27/2017 21:47 CONTRAST:  Total of 200 cc Omnipaque 370 administered. Electronically Signed   By: Mitzi Hansen M.D.   On: 07/27/2017 21:47   Result Date: 07/27/2017 CLINICAL DATA:  42 y/o  M; right-sided numbness, acute stroke. EXAM: CT ANGIOGRAPHY HEAD AND NECK CT PERFUSION BRAIN TECHNIQUE: Multidetector CT imaging of the head and neck was performed using the standard protocol during bolus administration of intravenous contrast. Multiplanar CT image reconstructions and MIPs were obtained to evaluate the vascular anatomy. Carotid stenosis measurements (when applicable) are obtained utilizing NASCET criteria, using the distal internal carotid diameter as the denominator. Multiphase CT imaging of the brain was performed following IV bolus contrast injection. Subsequent parametric perfusion maps were calculated using RAPID software. CONTRAST:  See addendum. COMPARISON:  07/27/2016 CT head FINDINGS: CTA NECK FINDINGS Aortic arch: Standard branching. Imaged portion shows no evidence of aneurysm or dissection. No significant stenosis of the major arch vessel origins. Right carotid system: No evidence of dissection, stenosis (50% or greater) or occlusion. Left carotid system: No evidence of dissection, stenosis (50% or greater) or occlusion.  Vertebral arteries: Right dominant. No evidence of dissection, stenosis (50% or greater) or occlusion. Skeleton: Negative. Other neck: Nodules in bilateral lobes of thyroid measuring up to 2.6 cm on the right (series 7 image 149). Upper chest: Negative. Review of the MIP  images confirms the above findings CTA HEAD FINDINGS Anterior circulation: Left proximal M1 occlusion with intermediate left MCA distribution collateralization. Otherwise no significant stenosis, proximal occlusion, aneurysm, or vascular malformation in the anterior circulation. Posterior circulation: No significant stenosis, proximal occlusion, aneurysm, or vascular malformation. Venous sinuses: As permitted by contrast timing, patent. Anatomic variants: Bilateral fetal PCA. Small anterior communicating artery. Review of the MIP images confirms the above findings CT Brain Perfusion Findings: CBF (<30%) Volume: 82mL Perfusion (Tmax>6.0s) volume: Mismatch Volume: 75mL Infarction Location:Left MCA distribution IMPRESSION: CTA head: 1. Left M1 occlusion with intermediate left MCA distribution collateralization. 2. Otherwise patent anterior and posterior intracranial circulation. No additional large vessel occlusion, aneurysm, or significant stenosis is identified. CTA neck: 1. Patent carotid and vertebral arteries. No dissection, aneurysm, or significant stenosis by NASCET criteria. 2. Nodules in bilateral lobes of thyroid measuring up to 2.6 cm on the right. Thyroid ultrasound is recommended on a nonemergent basis. CT perfusion head: By automated RAPID quantification: Infarct core 82 cc, ischemic penumbra 75 cc, infarct location in left MCA distribution. These results were called by telephone at the time of interpretation on 07/27/2017 at 9:23 pm to Dr. Milon Dikes , who verbally acknowledged these results. Electronically Signed: By: Mitzi Hansen M.D. On: 07/27/2017 21:39   Mr Brain Wo Contrast  Result Date: 07/28/2017 CLINICAL  DATA:  Follow-up LEFT-sided stroke. Status post tPA July 27, 2017. EXAM: MRI HEAD WITHOUT CONTRAST TECHNIQUE: Multiplanar, multiecho pulse sequences of the brain and surrounding structures were obtained without intravenous contrast. COMPARISON:  CT HEAD July 28, 2017 at 0852 hours FINDINGS: BRAIN: Confluent LEFT frontotemporal parietal and LEFT basal ganglia reduced diffusion with involvement of the mesial LEFT parietal and frontal lobes. Low ADC values. Susceptibility artifact within LEFT sylvian fissure and LEFT frontal parietal sulci. No intraparenchymal hemorrhage. Worsening 8 mm LEFT-to-RIGHT midline shift, increased from 2 mm. Partially effaced LEFT lateral ventricle with early RIGHT lateral ventricle entrapment. 11 mm T2 bright simple cyst within or adjacent to splenium of corpus callosum. No significant extra-axial fluid accumulation. VASCULAR: Normal major intracranial vascular flow voids present at skull base. SKULL AND UPPER CERVICAL SPINE: No abnormal sellar expansion. No suspicious calvarial bone marrow signal. Craniocervical junction maintained. SINUSES/ORBITS: Secretions layering in scratches at layering secretions in paranasal sinuses. Life-support lines in place. Mastoid air cells are well aerated. The included ocular globes and orbital contents are non-suspicious. OTHER: None. IMPRESSION: 1. Evolving acute large LEFT MCA territory nonhemorrhagic infarct. Evolving acute distal LEFT ACA territory nonhemorrhagic infarct. 2. Worsening 8 mm LEFT-to-RIGHT midline shift with early RIGHT ventricle entrapment. 3. Susceptibility artifact LEFT sylvian fissure and LEFT sulci corresponding to known extra-axial hemorrhage. Acute findings text paged to Dr.ASHISH ARORA via [employer] secure system on 07/28/2017 at 10:37 pm. Electronically Signed   By: Awilda Metro M.D.   On: 07/28/2017 22:38   Ct Cerebral Perfusion W Contrast  Addendum Date: 07/27/2017   ADDENDUM REPORT: 07/27/2017 21:47 CONTRAST:  Total  of 200 cc Omnipaque 370 administered. Electronically Signed   By: Mitzi Hansen M.D.   On: 07/27/2017 21:47   Result Date: 07/27/2017 CLINICAL DATA:  42 y/o  M; right-sided numbness, acute stroke. EXAM: CT ANGIOGRAPHY HEAD AND NECK CT PERFUSION BRAIN TECHNIQUE: Multidetector CT imaging of the head and neck was performed using the standard protocol during bolus administration of intravenous contrast. Multiplanar CT image reconstructions and MIPs were obtained to evaluate the vascular anatomy. Carotid stenosis measurements (when applicable) are obtained utilizing NASCET criteria, using the distal  internal carotid diameter as the denominator. Multiphase CT imaging of the brain was performed following IV bolus contrast injection. Subsequent parametric perfusion maps were calculated using RAPID software. CONTRAST:  See addendum. COMPARISON:  07/27/2016 CT head FINDINGS: CTA NECK FINDINGS Aortic arch: Standard branching. Imaged portion shows no evidence of aneurysm or dissection. No significant stenosis of the major arch vessel origins. Right carotid system: No evidence of dissection, stenosis (50% or greater) or occlusion. Left carotid system: No evidence of dissection, stenosis (50% or greater) or occlusion. Vertebral arteries: Right dominant. No evidence of dissection, stenosis (50% or greater) or occlusion. Skeleton: Negative. Other neck: Nodules in bilateral lobes of thyroid measuring up to 2.6 cm on the right (series 7 image 149). Upper chest: Negative. Review of the MIP images confirms the above findings CTA HEAD FINDINGS Anterior circulation: Left proximal M1 occlusion with intermediate left MCA distribution collateralization. Otherwise no significant stenosis, proximal occlusion, aneurysm, or vascular malformation in the anterior circulation. Posterior circulation: No significant stenosis, proximal occlusion, aneurysm, or vascular malformation. Venous sinuses: As permitted by contrast timing, patent.  Anatomic variants: Bilateral fetal PCA. Small anterior communicating artery. Review of the MIP images confirms the above findings CT Brain Perfusion Findings: CBF (<30%) Volume: 82mL Perfusion (Tmax>6.0s) volume: Mismatch Volume: 75mL Infarction Location:Left MCA distribution IMPRESSION: CTA head: 1. Left M1 occlusion with intermediate left MCA distribution collateralization. 2. Otherwise patent anterior and posterior intracranial circulation. No additional large vessel occlusion, aneurysm, or significant stenosis is identified. CTA neck: 1. Patent carotid and vertebral arteries. No dissection, aneurysm, or significant stenosis by NASCET criteria. 2. Nodules in bilateral lobes of thyroid measuring up to 2.6 cm on the right. Thyroid ultrasound is recommended on a nonemergent basis. CT perfusion head: By automated RAPID quantification: Infarct core 82 cc, ischemic penumbra 75 cc, infarct location in left MCA distribution. These results were called by telephone at the time of interpretation on 07/27/2017 at 9:23 pm to Dr. Milon Dikes , who verbally acknowledged these results. Electronically Signed: By: Mitzi Hansen M.D. On: 07/27/2017 21:39   Dg Chest Port 1 View  Result Date: 07/28/2017 CLINICAL DATA:  Central line placement EXAM: PORTABLE CHEST 1 VIEW COMPARISON:  08/06/2017 FINDINGS: Endotracheal tube and NG tube are unchanged. Right subclavian central line is been placed with the tip at the cavoatrial junction. No pneumothorax. Heart is normal size. Lungs are clear. No effusions or acute bony abnormality. IMPRESSION: No active disease. Electronically Signed   By: Charlett Nose M.D.   On: 07/28/2017 15:11   Dg Chest Port 1 View  Result Date: 07/28/2017 CLINICAL DATA:  43 year old male status post intubation. EXAM: PORTABLE CHEST 1 VIEW COMPARISON:  None. FINDINGS: An enteric tube is seen with side-port in the region of the gastroesophageal junction and tip in the proximal stomach. Recommend  further advancing of the tube into the stomach by approximately 3-4 cm. A partially visualized structure with tip at the level of the T1 may represent the endotracheal tube. Clinical correlation is recommended. If an endotracheal tube is present recommend further advancing the tube by approximately 3 cm for optimal positioning. The lungs are clear. There is no pleural effusion or pneumothorax. The cardiac silhouette is within normal limits. No acute osseous pathology. IMPRESSION: 1. Enteric tube with tip in the stomach and side-port in the region of the gastroesophageal junction. Recommend advancing the tube further into the stomach for optimal positioning. A partially visualized density over the T1 may represent the tip of the endotracheal tube. If  an endotracheal tube is present recommend further advancing by approximately 3 cm for optimal positioning. 2. No acute cardiopulmonary process. Electronically Signed   By: Elgie CollardArash  Radparvar M.D.   On: 07/28/2017 01:18   Ct Head Code Stroke Wo Contrast  Result Date: 07/27/2017 CLINICAL DATA:  Code stroke. 42 y/o M; right-sided weakness and facial droop. EXAM: CT HEAD WITHOUT CONTRAST TECHNIQUE: Contiguous axial images were obtained from the base of the skull through the vertex without intravenous contrast. COMPARISON:  None. FINDINGS: Brain: Faint lucency involving left insula and frontal operculum. No significant mass effect or hemorrhage at this time. No hydrocephalus, basilar cistern effacement, or extra-axial collection. Vascular: Dense left M1. Skull: Normal. Negative for fracture or focal lesion. Sinuses/Orbits: No acute finding. Other: None. ASPECTS [facility]([place] Stroke Program Early CT Score) - Ganglionic level infarction (caudate, lentiform nuclei, internal capsule, insula, M1-M3 cortex): 5 - Supraganglionic infarction (M4-M6 cortex): 3 Total score (0-10 with 10 being normal): 8 IMPRESSION: 1. Left insula and frontal operculum lucency compatible with infarction. No  hemorrhage or mass effect. 2. Left M1 hyperdensity, likely thrombus. 3. ASPECTS is 8 These results were called by telephone at the time of interpretation on 07/27/2017 at 9:10 pm to Dr. Wilford CornerArora , who verbally acknowledged these results. Electronically Signed   By: Mitzi HansenLance  Furusawa-Stratton M.D.   On: 07/27/2017 21:12    Labs:  CBC: Recent Labs    07/27/17 2055 07/27/17 2103 07/28/17 0259 07/29/17 0503 07/30/17 0440  WBC 6.6  --  8.0 9.7 11.2*  HGB 16.1 16.3 13.2 11.4* 10.5*  HCT 45.5 48.0 37.9* 34.0* 31.2*  PLT 228  --  203 185 179    COAGS: Recent Labs    07/27/17 2055  INR 0.98  APTT 25    BMP: Recent Labs    07/27/17 2055 07/27/17 2103 07/28/17 0259  07/29/17 0503 07/29/17 1122 07/29/17 1722 07/29/17 2325 07/30/17 0440  NA 136 138 136   < > 144 147* 147* 150* 150*  K 4.1 4.1 3.9  --  3.8  --   --   --  3.6  CL 99* 99* 105  --  112*  --   --   --  119*  CO2 26  --  24  --  23  --   --   --  25  GLUCOSE 129* 125* 123*  --  134*  --   --   --  149*  BUN 22* 25* 18  --  16  --   --   --  13  CALCIUM 9.2  --  8.2*  --  8.0*  --   --   --  8.1*  CREATININE 1.29* 1.30* 0.95  --  0.83  --   --   --  0.91  GFRNONAA >60  --  >60  --  >60  --   --   --  >60  GFRAA >60  --  >60  --  >60  --   --   --  >60   < > = values in this interval not displayed.    LIVER FUNCTION TESTS: Recent Labs    07/27/17 2055  BILITOT 1.1  AST 48*  ALT 98*  ALKPHOS 40  PROT 7.8  ALBUMIN 4.2    Assessment and Plan: 1. CVA, s/p attempted revascularization with SAH, and subsequent left hemicraniectomy   Per patient's mom, patient did follow some commands for neuro this morning.  He is sedated currently.  Further plans  per neuro and NS.  Electronically Signed: Letha Cape 07/30/2017, 11:39 AM   I spent a total of 15 Minutes at the the patient's bedside AND on the patient's hospital floor or unit, greater than 50% of which was counseling/coordinating care for CVA

## 2017-07-30 NOTE — Progress Notes (Addendum)
Patient's HR increased to 130s-140s around 2000, pt shivering and not tolerating ventilator even with appropriate sedation and pain medication. This RN and day shift RN removed [employer] cooling pads at this time, paged on call Neuro MD and placed warm blankets to try and stop the shivering.   Patient is now stable on the monitor, prn Tylenol was given and will continue to monitor patient's temp. No call back from on call Neuro MD at this time for further orders.

## 2017-07-30 NOTE — Progress Notes (Signed)
Nutrition Follow-up  INTERVENTION:   Initiate Vital AF @ 60 ml/hr (1440 ml/day) 30 ml Prostat daily Provides: 1828 kcal, 123 grams protein, and 1167 ml free water.  TF regimen and propofol at current rate providing 1970 total kcal/day (98 % of kcal needs)  NUTRITION DIAGNOSIS:   Inadequate oral intake related to inability to eat as evidenced by NPO status. Ongoing.   GOAL:   Patient will meet greater than or equal to 90% of their needs Progressing.   MONITOR:   TF tolerance, I & O's  REASON FOR ASSESSMENT:   Consult Enteral/tube feeding initiation and management  ASSESSMENT:   42 year old with a past history of hypertension admitted with left large MCA infarct status post TPA with unsuccessful IR attempt causing left SAH.  Pt discussed during ICU rounds and with RN.  1/13 L hemicraniectomy  Patient is currently intubated on ventilator support MV: 8.5 L/min Temp (24hrs), Avg:100 F (37.8 C), Min:98.7 F (37.1 C), Max:101.4 F (38.6 C)  Propofol: 5.4 ml/hr provides: 142   Medications reviewed and include: hypertonic saline Cleviprex ordered but not infusing at this time Labs reviewed: Na 151 (H)    Diet Order:  Diet NPO time specified  EDUCATION NEEDS:   No education needs have been identified at this time  Skin:  Skin Assessment: Reviewed RN Assessment  Last BM:  PTA  Height:   Ht Readings from Last 1 Encounters:  07/28/17 5\' 10"  (1.778 m)    Weight:   Wt Readings from Last 1 Encounters:  07/30/17 188 lb 4.4 oz (85.4 kg)    Ideal Body Weight:  75.5 kg  BMI:  Body mass index is 27.01 kg/m.  Estimated Nutritional Needs:   Kcal:  2008  Protein:  115-130 grams  Fluid:  > 2 L  Kendell BaneHeather Burkley Dech RD, LDN, CNSC [phone] Pager [phone] After Hours Pager

## 2017-07-30 NOTE — Progress Notes (Signed)
PULMONARY / CRITICAL CARE MEDICINE   Name: Jeremy [name] MRN: [id number] DOB: 06-Jul-1976    ADMISSION DATE:  07/27/2017 CONSULTATION DATE:  07/27/2017   REFERRING MD:  Dr. Wilford Corner  CHIEF COMPLAINT:  Vent management post stroke  HISTORY OF PRESENT ILLNESS:   42 y/o male with OSA, HTN was admitted on 1/11 with abrupt onset R facial droop.  He was found to have evidence of a left MCA stroke. He was given IV TP and went unsuccessful IR attempt for thrombectomy.    Of note, patient reported headache, fever 103, and flu like symptoms for 2-3 days prior to presentation on 1/11.  WBC normal on admit, low grade temp.  SUBJECTIVE:  Propofol off PSV 5/5 since ~0700 Awake/ following commands Temp 100.4 tmax   VITAL SIGNS: BP (!) 148/94   Pulse 92   Temp 99.1 F (37.3 C) (Axillary)   Resp 18   Ht 5\' 10"  (1.778 m)   Wt 188 lb 4.4 oz (85.4 kg)   SpO2 100%   BMI 27.01 kg/m   HEMODYNAMICS:    VENTILATOR SETTINGS: Vent Mode: PSV;CPAP FiO2 (%):  [40 %] 40 % Set Rate:  [14 bmp] 14 bmp Vt Set:  [580 mL] 580 mL PEEP:  [5 cmH20] 5 cmH20 Pressure Support:  [5 cmH20] 5 cmH20 Plateau Pressure:  [8 cmH20-17 cmH20] 8 cmH20  INTAKE / OUTPUT: I/O last 3 completed shifts: In: 2292.1 [I.V.:2262.1; IV Piggyback:30] Out: 3825 [Urine:3725; Blood:100]  PHYSICAL EXAMINATION: General:  Critically ill adult male on MV in NAD HEENT: MM pink/moist, ETT/ OGT, pupils 4/reactive, left horizontal nystagmus and preference, Left hemicrani site with dressing, soft   Neuro: Awakens to voice, f/c on left arm and leg, right arm/leg flaccid CV:  rrr, no m/r/g PULM: even/non-labored on PSV 5/5, lungs bilaterally CTA GI: soft, bs active, bone flap in RUQ- dressing CDI Extremities: warm/dry, no edema, SCDs  LABS:  BMET Recent Labs  Lab 07/28/17 0259  07/29/17 0503  07/29/17 1722 07/29/17 2325 07/30/17 0440  NA 136   < > 144   < > 147* 150* 150*  K 3.9  --  3.8  --   --   --  3.6  CL 105  --  112*  --   --    --  119*  CO2 24  --  23  --   --   --  25  BUN 18  --  16  --   --   --  13  CREATININE 0.95  --  0.83  --   --   --  0.91  GLUCOSE 123*  --  134*  --   --   --  149*   < > = values in this interval not displayed.    Electrolytes Recent Labs  Lab 07/28/17 0259 07/29/17 0503 07/30/17 0440  CALCIUM 8.2* 8.0* 8.1*  MG 2.2  --   --   PHOS 3.2  --   --     CBC Recent Labs  Lab 07/28/17 0259 07/29/17 0503 07/30/17 0440  WBC 8.0 9.7 11.2*  HGB 13.2 11.4* 10.5*  HCT 37.9* 34.0* 31.2*  PLT 203 185 179    Coag's Recent Labs  Lab 07/27/17 2055  APTT 25  INR 0.98    Sepsis Markers No results for input(s): LATICACIDVEN, PROCALCITON, O2SATVEN in the last 168 hours.  ABG Recent Labs  Lab 07/28/17 0137  PHART 7.341*  PCO2ART 43.2  PO2ART 170.0*    Liver Enzymes  Recent Labs  Lab 07/27/17 2055  AST 48*  ALT 98*  ALKPHOS 40  BILITOT 1.1  ALBUMIN 4.2    Cardiac Enzymes No results for input(s): TROPONINI, PROBNP in the last 168 hours.  Glucose No results for input(s): GLUCAP in the last 168 hours.  Imaging No results found.   STUDIES:  1/11 CT head L MCA sign 1/11 CT angio neck/head> L M1 MCA occlusion 1/12 CT head :  CT head : left MCA distribution infarct, some extravasation of contrast in sylvian fissure, small volume subarachnoid hemorrhage, fluid in sinuses  1/12 CT head >>  Progressive L MCA territory infarct with further involvement of left caudate head and lentiform nucleus; extensive nonhemorrhagic infarcts of left temporal tip, left frontal operculum, left parietal lobe, medial left front lobe distal left ACA territory; contrast extravasation compatible w/ perforation into left sylvian fissure, sulci, and interpeduncular notch; minimal midline shift  1/12 MRI Brain >> 1. Evolving acute large LEFT MCA territory nonhemorrhagic infarct. Evolving acute distal LEFT ACA territory nonhemorrhagic infarct. 2. Worsening 8 mm LEFT-to-RIGHT midline shift  with early RIGHT ventricle entrapment. 3. Susceptibility artifact LEFT sylvian fissure and LEFT sulci corresponding to known extra-axial hemorrhage.  1/13 CT head >> 1. Evolving left MCA and ACA territory infarct increasing mass effect and midline shift, now measuring 10 mm. 2. Mild increasing dilation of the right temporal tip compatible with left-sided effacement. 3. Developing uncal herniation without significant downward herniation of the brainstem. 4. Subarachnoid contrast and hemorrhage again noted within the sylvian fissure.  TTE 1/13 >> EF 60-65%, mild LVH  CULTURES: MRSA PCR 1/12 >>  negatives  ANTIBIOTICS: Cefazolin 1/11 >> 1/13  SIGNIFICANT EVENTS: 1/11 admission for R MCA stroke: IV tPA and failed IR Thrombectomy 1/12 3% Na started 1/13 Left decompressive hemicraniectomy  LINES/TUBES: 1/11 ETT >  1/11 OGT > 1/11 Foley  1/11 R [place] CVC >>   DISCUSSION: 42 yo male with subjective fever 103 and headache at home 2-3 days prior to presenting on 1/11 with right sided weakness found to have an acute L MCA stroke, had IR thrombectomy post TPA. Some small subarachnoid bleeding and extravasation post thromobectomy.  CT imaging from 1/12 shows evolving signs of MCA stroke.   ASSESSMENT / PLAN:  PULMONARY A:  Acute respiratory failure with hypoxemia OSA P:  Full mechanical vent support PRVC 8 cc/kg Trend CXR/ ABG VAP prevention Daily WUA/SBT Neuro prefers to keep patient on MV for one more day to prevent elevated ICP   CARDIOVASCULAR A:  Acute MCA stroke Hx HTN - TTE 1/13 without acute findings or defects in atrial septum, no PFO P:  Tele MAP goal >65 Consider TEE  RENAL A:  Hypernatremia- s/p hypertonic saline  P:  Monitor BMET and UOP Replace electrolytes as needed  GASTROINTESTINAL A:  No acute issues P:  Restart TF if not extubated today  Pantoprazole for stress ulcer prophylaxis   HEMATOLOGIC A:  Mild leukocytosis - absent fever, CXR  neg 1/12 Mild anemia- stable Jehovah witness P:  Trend CBC Monitor for bleeding SCDs for VTE ppx   INFECTIOUS A:  Subjective fever 103 x 2-3 w/headache prior to presenting with acute L MCA CVA - afebrile, normal WBC, neg CXR  P:  Monitor for fever/ trend WBC Check BC, UA Tylenol prn   ENDOCRINE A: Mild hyperglycemia HgbA1c - 5.2 P:  Trend on BMET SSI if glucose greater than 180  NEUROLOGIC A:  Acute L MCA stroke s/p TPA and failed IR  attempt > ? embolic Vessel perforation post thrombectomy Cerebreal Edema s/p L decompressive hemicraniectomy w/ bone flap in abd SQ pocket - bilateral LE venous dopplers neg - TTE showed no defect or PFO P:  RASS goal: -1 PAD protocol with propofol gtt and PRN fentanyl Remainder per NSGY and Neuro Stroke workup  Goal Na 150-155, prn 23.4% saline per Neuro Repeat imaging in AM Consider ddx for type of temporal arteritis, vasculitis given symptoms of fever 103 and headache 2-3 days prior to CVA; however labs results to test for these would not be reliable.     FAMILY  - Updates:  No family at bedside.   - Inter-disciplinary family meet or Palliative Care meeting due by:  day 7  CCT   Posey BoyerBrooke Yuma Blucher, AGACNP-BC [facility] Pgr: [phone] or if no answer [phone] 07/30/2017, 8:29 AM

## 2017-07-30 NOTE — Progress Notes (Signed)
Pt shivering. Blanket turned to monitor.

## 2017-07-30 NOTE — Progress Notes (Signed)
At 6 PM, pt temp was 100.1 R. I called Dr Roda ShuttersXu to see if he wanted to progress to "tier two" of normothermia protocol, Artic Sun. He said yes, but that if shivering cannot be controlled with analgesia, do not use paralytic. We placed pt on Arctic Sun. I was typing in order for "Tier Two" and order said that this tier must be ordered by CCM. [phone] paged. When no response was received, I called [facility] for further assistance. CCM and night shift RN to resolve.

## 2017-07-30 NOTE — Progress Notes (Signed)
PT Cancellation Note  Patient Details Name: Cletus GashJao Mcdade MRN: [id number] DOB: 1975-07-31   Cancelled Treatment:    Reason Eval/Treat Not Completed: Medical issues which prohibited therapy   Armin Yerger B Knox Cervi 07/30/2017, 8:32 AM  Delaney MeigsMaija Tabor Kayly Kriegel, PT [phone]

## 2017-07-30 NOTE — Progress Notes (Signed)
OT Cancellation    07/30/17 1000  OT Visit Information  Last OT Received On 07/30/17  Reason Eval/Treat Not Completed Patient not medically ready. Will return as schedule allows and pt medically appropriate. Thank you.   Brynna Dobos MSOT, OTR/L Acute Rehab Pager: [phone] Office: [phone]

## 2017-07-30 NOTE — Progress Notes (Signed)
Orders from Dr. Laurence SlateAroor to continue Tylenol prn around the clock for fever and apply ice packs as needed.

## 2017-07-30 NOTE — Progress Notes (Signed)
STROKE TEAM PROGRESS NOTE   SUBJECTIVE (INTERVAL HISTORY) His mom is at the bedside.  Pt had hemicrani yesterday, flat pressure moderate this am, not tense. Pt still following commands, on low dose propofol. Na 150. Will keep intubated today and repeat CT head in am.    Past Medical History:  Diagnosis Date  . Anxiety   . Asthma    MILD AS A CHILD  . Depression   . Deviated septum   . Dyspnea    NORMAL SPIROMETRY 02/01/17 VISIT WITH DR [place]  . Hypertension   . Sleep apnea    FAMILY HISTORY History reviewed. No pertinent family history.  SOCIAL HISTORY  reports that  has never smoked. he has never used smokeless tobacco. He reports that he drinks alcohol. He reports that he does not use drugs.   HOME MEDICATIONS:  Current Meds  Medication Sig  . spironolactone (ALDACTONE) 100 MG tablet Take 100 mg by mouth daily.      HOSPITAL MEDICATIONS:  . chlorhexidine gluconate (MEDLINE KIT)  15 mL Mouth Rinse BID  . Chlorhexidine Gluconate Cloth  6 each Topical Q0600  . feeding supplement (PRO-STAT SUGAR FREE 64)  30 mL Per Tube BID  . feeding supplement (VITAL HIGH PROTEIN)  1,000 mL Per Tube Q24H  . mouth rinse  15 mL Mouth Rinse 10 times per day  . pantoprazole (PROTONIX) IV  40 mg Intravenous QHS    OBJECTIVE Temp:  [98.4 F (36.9 C)-100.4 F (38 C)] 100.4 F (38 C) (01/14 0800) Pulse Rate:  [49-112] 86 (01/14 0800) Cardiac Rhythm: Sinus tachycardia;Normal sinus rhythm (01/14 0400) Resp:  [13-22] 21 (01/14 0800) BP: (123-154)/(82-102) 151/96 (01/14 0800) SpO2:  [99 %-100 %] 100 % (01/14 0800) FiO2 (%):  [40 %] 40 % (01/14 0739) Weight:  [188 lb 4.4 oz (85.4 kg)] 188 lb 4.4 oz (85.4 kg) (01/14 0500)  CBC:  Recent Labs  Lab 07/27/17 2055  07/28/17 0259 07/29/17 0503 07/30/17 0440  WBC 6.6  --  8.0 9.7 11.2*  NEUTROABS 3.2  --  6.3  --   --   HGB 16.1   < > 13.2 11.4* 10.5*  HCT 45.5   < > 37.9* 34.0* 31.2*  MCV 88.0  --  87.5 90.4 93.1  PLT 228  --  203 185  179   < > = values in this interval not displayed.    Basic Metabolic Panel:  Recent Labs  Lab 07/28/17 0259  07/29/17 0503  07/29/17 2325 07/30/17 0440  NA 136   < > 144   < > 150* 150*  K 3.9  --  3.8  --   --  3.6  CL 105  --  112*  --   --  119*  CO2 24  --  23  --   --  25  GLUCOSE 123*  --  134*  --   --  149*  BUN 18  --  16  --   --  13  CREATININE 0.95  --  0.83  --   --  0.91  CALCIUM 8.2*  --  8.0*  --   --  8.1*  MG 2.2  --   --   --   --   --   PHOS 3.2  --   --   --   --   --    < > = values in this interval not displayed.    Lipid Panel:     Component  Value Date/Time   CHOL 218 (H) 07/28/2017 0259   TRIG 417 (H) 07/28/2017 0259   TRIG 428 (H) 07/28/2017 0259   HDL 28 (L) 07/28/2017 0259   CHOLHDL 7.8 07/28/2017 0259   VLDL UNABLE TO CALCULATE IF TRIGLYCERIDE OVER 400 mg/dL 07/28/2017 0259   LDLCALC UNABLE TO CALCULATE IF TRIGLYCERIDE OVER 400 mg/dL 07/28/2017 0259   HgbA1c:  Lab Results  Component Value Date   HGBA1C 5.2 07/28/2017   Urine Drug Screen:     Component Value Date/Time   LABOPIA NONE DETECTED 07/28/2017 0415   COCAINSCRNUR NONE DETECTED 07/28/2017 0415   LABBENZ NONE DETECTED 07/28/2017 0415   AMPHETMU NONE DETECTED 07/28/2017 0415   [place] DETECTED 07/28/2017 0415   LABBARB NONE DETECTED 07/28/2017 0415    Alcohol Level No results found for: [place] I have personally reviewed the radiological images below and agree with the radiology interpretations.  Ct Angio Head W Or Wo Contrast  Addendum Date: 07/27/2017   ADDENDUM REPORT: 07/27/2017 21:47 CONTRAST:  Total of 200 cc Omnipaque 370 administered. Electronically Signed   By: Kristine Garbe M.D.   On: 07/27/2017 21:47   Result Date: 07/27/2017 CLINICAL DATA:  42 y/o  M; right-sided numbness, acute stroke. EXAM: CT ANGIOGRAPHY HEAD AND NECK CT PERFUSION BRAIN TECHNIQUE: Multidetector CT imaging of the head and neck was performed using the standard protocol during  bolus administration of intravenous contrast. Multiplanar CT image reconstructions and MIPs were obtained to evaluate the vascular anatomy. Carotid stenosis measurements (when applicable) are obtained utilizing NASCET criteria, using the distal internal carotid diameter as the denominator. Multiphase CT imaging of the brain was performed following IV bolus contrast injection. Subsequent parametric perfusion maps were calculated using RAPID software. CONTRAST:  See addendum. COMPARISON:  07/27/2016 CT head FINDINGS: CTA NECK FINDINGS Aortic arch: Standard branching. Imaged portion shows no evidence of aneurysm or dissection. No significant stenosis of the major arch vessel origins. Right carotid system: No evidence of dissection, stenosis (50% or greater) or occlusion. Left carotid system: No evidence of dissection, stenosis (50% or greater) or occlusion. Vertebral arteries: Right dominant. No evidence of dissection, stenosis (50% or greater) or occlusion. Skeleton: Negative. Other neck: Nodules in bilateral lobes of thyroid measuring up to 2.6 cm on the right (series 7 image 149). Upper chest: Negative. Review of the MIP images confirms the above findings CTA HEAD FINDINGS Anterior circulation: Left proximal M1 occlusion with intermediate left MCA distribution collateralization. Otherwise no significant stenosis, proximal occlusion, aneurysm, or vascular malformation in the anterior circulation. Posterior circulation: No significant stenosis, proximal occlusion, aneurysm, or vascular malformation. Venous sinuses: As permitted by contrast timing, patent. Anatomic variants: Bilateral fetal PCA. Small anterior communicating artery. Review of the MIP images confirms the above findings CT Brain Perfusion Findings: CBF (<30%) Volume: 79m Perfusion (Tmax>6.0s) volume: 1553mMismatch Volume: 7530mnfarction Location:Left MCA distribution IMPRESSION: CTA head: 1. Left M1 occlusion with intermediate left MCA distribution  collateralization. 2. Otherwise patent anterior and posterior intracranial circulation. No additional large vessel occlusion, aneurysm, or significant stenosis is identified. CTA neck: 1. Patent carotid and vertebral arteries. No dissection, aneurysm, or significant stenosis by NASCET criteria. 2. Nodules in bilateral lobes of thyroid measuring up to 2.6 cm on the right. Thyroid ultrasound is recommended on a nonemergent basis. CT perfusion head: By automated RAPID quantification: Infarct core 82 cc, ischemic penumbra 75 cc, infarct location in left MCA distribution. These results were called by telephone at the time of interpretation on 07/27/2017  at 9:23 pm to Dr. Amie [place] , who verbally acknowledged these results. Electronically Signed: By: Kristine Garbe M.D. On: 07/27/2017 21:39   Ct Head Wo Contrast  Result Date: 07/28/2017 CLINICAL DATA:  Follow-up suspected LEFT middle cerebral artery extravasation during incomplete thrombectomy. EXAM: CT HEAD WITHOUT CONTRAST TECHNIQUE: Contiguous axial images were obtained from the base of the skull through the vertex without intravenous contrast. COMPARISON:  Multiple CT HEAD May 27, 2018 FINDINGS: BRAIN: Increased density LEFT MCA distribution extending into LEFT insula and LEFT frontoparietal sulci measuring to 91 Hounsfield units. Faint density interpeduncular cistern measuring 50 Hounsfield units. Confluent LEFT frontotemporal parietal hypodensity extending to LEFT basal ganglia. No convincing evidence of intraparenchymal hemorrhage. Regional LEFT cerebrum mass effect without midline shift. No hydrocephalus. Basal cisterns are patent. 1 cm cyst in caudal aspect splenium of corpus callosum versus pineal cyst, less likely. VASCULAR: Dense LEFT MCA (55 Hounsfield units). New density along anterior cerebral artery distribution. SKULL/SOFT TISSUES: No skull fracture. No significant soft tissue swelling. ORBITS/SINUSES: The included ocular globes and  orbital contents are normal.Mild paranasal sinusitis. Life-support lines in place. Mastoid air cells are well aerated. OTHER: None. IMPRESSION: 1. Evolving acute large LEFT MCA territory infarct without hemorrhagic conversion. 2. Postprocedural extra-axial contrast extravasation in addition to probable contrast staining and small volume subarachnoid hemorrhage. 3. New density anterior cerebral artery concerning for thromboembolism. Critical Value/emergent results were called by telephone at the time of interpretation on 07/28/2017 at 4:00 am to Dr. Amie [place] , who verbally acknowledged these results. Electronically Signed   By: Elon Alas M.D.   On: 07/28/2017 04:16   Ct Angio Neck W Or Wo Contrast  Addendum Date: 07/27/2017   ADDENDUM REPORT: 07/27/2017 21:47 CONTRAST:  Total of 200 cc Omnipaque 370 administered. Electronically Signed   By: Kristine Garbe M.D.   On: 07/27/2017 21:47   Result Date: 07/27/2017 CLINICAL DATA:  42 y/o  M; right-sided numbness, acute stroke. EXAM: CT ANGIOGRAPHY HEAD AND NECK CT PERFUSION BRAIN TECHNIQUE: Multidetector CT imaging of the head and neck was performed using the standard protocol during bolus administration of intravenous contrast. Multiplanar CT image reconstructions and MIPs were obtained to evaluate the vascular anatomy. Carotid stenosis measurements (when applicable) are obtained utilizing NASCET criteria, using the distal internal carotid diameter as the denominator. Multiphase CT imaging of the brain was performed following IV bolus contrast injection. Subsequent parametric perfusion maps were calculated using RAPID software. CONTRAST:  See addendum. COMPARISON:  07/27/2016 CT head FINDINGS: CTA NECK FINDINGS Aortic arch: Standard branching. Imaged portion shows no evidence of aneurysm or dissection. No significant stenosis of the major arch vessel origins. Right carotid system: No evidence of dissection, stenosis (50% or greater) or occlusion.  Left carotid system: No evidence of dissection, stenosis (50% or greater) or occlusion. Vertebral arteries: Right dominant. No evidence of dissection, stenosis (50% or greater) or occlusion. Skeleton: Negative. Other neck: Nodules in bilateral lobes of thyroid measuring up to 2.6 cm on the right (series 7 image 149). Upper chest: Negative. Review of the MIP images confirms the above findings CTA HEAD FINDINGS Anterior circulation: Left proximal M1 occlusion with intermediate left MCA distribution collateralization. Otherwise no significant stenosis, proximal occlusion, aneurysm, or vascular malformation in the anterior circulation. Posterior circulation: No significant stenosis, proximal occlusion, aneurysm, or vascular malformation. Venous sinuses: As permitted by contrast timing, patent. Anatomic variants: Bilateral fetal PCA. Small anterior communicating artery. Review of the MIP images confirms the above findings CT Brain Perfusion Findings: CBF (<  30%) Volume: 55m Perfusion (Tmax>6.0s) volume: 1574mMismatch Volume: 7565mnfarction Location:Left MCA distribution IMPRESSION: CTA head: 1. Left M1 occlusion with intermediate left MCA distribution collateralization. 2. Otherwise patent anterior and posterior intracranial circulation. No additional large vessel occlusion, aneurysm, or significant stenosis is identified. CTA neck: 1. Patent carotid and vertebral arteries. No dissection, aneurysm, or significant stenosis by NASCET criteria. 2. Nodules in bilateral lobes of thyroid measuring up to 2.6 cm on the right. Thyroid ultrasound is recommended on a nonemergent basis. CT perfusion head: By automated RAPID quantification: Infarct core 82 cc, ischemic penumbra 75 cc, infarct location in left MCA distribution. These results were called by telephone at the time of interpretation on 07/27/2017 at 9:23 pm to Dr. ASHAmie Portlandwho verbally acknowledged these results. Electronically Signed: By: LanKristine Garbe.D. On: 07/27/2017 21:39   Ct Cerebral Perfusion W Contrast  Addendum Date: 07/27/2017   ADDENDUM REPORT: 07/27/2017 21:47 CONTRAST:  Total of 200 cc Omnipaque 370 administered. Electronically Signed   By: LanKristine GarbeD.   On: 07/27/2017 21:47   Result Date: 07/27/2017 CLINICAL DATA:  41 20[age]  M; right-sided numbness, acute stroke. EXAM: CT ANGIOGRAPHY HEAD AND NECK CT PERFUSION BRAIN TECHNIQUE: Multidetector CT imaging of the head and neck was performed using the standard protocol during bolus administration of intravenous contrast. Multiplanar CT image reconstructions and MIPs were obtained to evaluate the vascular anatomy. Carotid stenosis measurements (when applicable) are obtained utilizing NASCET criteria, using the distal internal carotid diameter as the denominator. Multiphase CT imaging of the brain was performed following IV bolus contrast injection. Subsequent parametric perfusion maps were calculated using RAPID software. CONTRAST:  See addendum. COMPARISON:  07/27/2016 CT head FINDINGS: CTA NECK FINDINGS Aortic arch: Standard branching. Imaged portion shows no evidence of aneurysm or dissection. No significant stenosis of the major arch vessel origins. Right carotid system: No evidence of dissection, stenosis (50% or greater) or occlusion. Left carotid system: No evidence of dissection, stenosis (50% or greater) or occlusion. Vertebral arteries: Right dominant. No evidence of dissection, stenosis (50% or greater) or occlusion. Skeleton: Negative. Other neck: Nodules in bilateral lobes of thyroid measuring up to 2.6 cm on the right (series 7 image 149). Upper chest: Negative. Review of the MIP images confirms the above findings CTA HEAD FINDINGS Anterior circulation: Left proximal M1 occlusion with intermediate left MCA distribution collateralization. Otherwise no significant stenosis, proximal occlusion, aneurysm, or vascular malformation in the anterior circulation. Posterior  circulation: No significant stenosis, proximal occlusion, aneurysm, or vascular malformation. Venous sinuses: As permitted by contrast timing, patent. Anatomic variants: Bilateral fetal PCA. Small anterior communicating artery. Review of the MIP images confirms the above findings CT Brain Perfusion Findings: CBF (<30%) Volume: 47m76mrfusion (Tmax>6.0s) volume: 157mL4mmatch Volume: 75mL 34mrction Location:Left MCA distribution IMPRESSION: CTA head: 1. Left M1 occlusion with intermediate left MCA distribution collateralization. 2. Otherwise patent anterior and posterior intracranial circulation. No additional large vessel occlusion, aneurysm, or significant stenosis is identified. CTA neck: 1. Patent carotid and vertebral arteries. No dissection, aneurysm, or significant stenosis by NASCET criteria. 2. Nodules in bilateral lobes of thyroid measuring up to 2.6 cm on the right. Thyroid ultrasound is recommended on a nonemergent basis. CT perfusion head: By automated RAPID quantification: Infarct core 82 cc, ischemic penumbra 75 cc, infarct location in left MCA distribution. These results were called by telephone at the time of interpretation on 07/27/2017 at 9:23 pm to Dr. ASHISHAmie Portland verbally acknowledged these results. Electronically Signed:  By: Kristine Garbe M.D. On: 07/27/2017 21:39   Dg Chest Port 1 View  Result Date: 07/28/2017 CLINICAL DATA:  42 year old male status post intubation. EXAM: PORTABLE CHEST 1 VIEW COMPARISON:  None. FINDINGS: An enteric tube is seen with side-port in the region of the gastroesophageal junction and tip in the proximal stomach. Recommend further advancing of the tube into the stomach by approximately 3-4 cm. A partially visualized structure with tip at the level of the T1 may represent the endotracheal tube. Clinical correlation is recommended. If an endotracheal tube is present recommend further advancing the tube by approximately 3 cm for optimal positioning.  The lungs are clear. There is no pleural effusion or pneumothorax. The cardiac silhouette is within normal limits. No acute osseous pathology. IMPRESSION: 1. Enteric tube with tip in the stomach and side-port in the region of the gastroesophageal junction. Recommend advancing the tube further into the stomach for optimal positioning. A partially visualized density over the T1 may represent the tip of the endotracheal tube. If an endotracheal tube is present recommend further advancing by approximately 3 cm for optimal positioning. 2. No acute cardiopulmonary process. Electronically Signed   By: Anner Crete M.D.   On: 07/28/2017 01:18   Ct Head Code Stroke Wo Contrast  Result Date: 07/27/2017 CLINICAL DATA:  Code stroke. 42 y/o M; right-sided weakness and facial droop. EXAM: CT HEAD WITHOUT CONTRAST TECHNIQUE: Contiguous axial images were obtained from the base of the skull through the vertex without intravenous contrast. COMPARISON:  None. FINDINGS: Brain: Faint lucency involving left insula and frontal operculum. No significant mass effect or hemorrhage at this time. No hydrocephalus, basilar cistern effacement, or extra-axial collection. Vascular: Dense left M1. Skull: Normal. Negative for fracture or focal lesion. Sinuses/Orbits: No acute finding. Other: None. ASPECTS [facility] Stroke Program Early CT Score) - Ganglionic level infarction (caudate, lentiform nuclei, internal capsule, insula, M1-M3 cortex): 5 - Supraganglionic infarction (M4-M6 cortex): 3 Total score (0-10 with 10 being normal): 8 IMPRESSION: 1. Left insula and frontal operculum lucency compatible with infarction. No hemorrhage or mass effect. 2. Left M1 hyperdensity, likely thrombus. 3. ASPECTS is 8 These results were called by telephone at the time of interpretation on 07/27/2017 at 9:10 pm to Dr. Rory Percy , who verbally acknowledged these results. Electronically Signed   By: Kristine Garbe M.D.   On: 07/27/2017 21:12   CT head  05/28/18 10am 1. Progressive left MCA territory infarct with further involvement of the left caudate head and lentiform nucleus. 2. Extensive nonhemorrhagic infarcts involving the left temporal tip, left frontal operculum, left parietal lobe, and medial left frontal lobe, the distal left ACA territory. 3. Contrast extravasation compatible with perforation into the left sylvian fissure, the sulci over the convexity, and the interpeduncular notch. 4. Minimal midline shift. 5. Fluid in the sinuses likely secondary to intubation.  Transthoracic Echocardiogram - Left ventricle: The cavity size was normal. Wall thickness was   increased in a pattern of mild LVH. Systolic function was normal.   The estimated ejection fraction was in the range of 60% to 65%.   Wall motion was normal; there were no regional wall motion   abnormalities. Left ventricular diastolic function parameters   were normal. - Atrial septum: No defect or patent foramen ovale was identified.  Bilateral LE venous Dopplers - No evidence of deep vein thrombosis or baker's cysts bilaterally.   Ct Head Wo Contrast  Result Date: 07/29/2017 CLINICAL DATA:  Stroke. EXAM: CT HEAD WITHOUT CONTRAST TECHNIQUE: Contiguous  axial images were obtained from the base of the skull through the vertex without intravenous contrast. COMPARISON:  MRI brain/12/19.  CT head 07/28/2017. FINDINGS: Brain: A large left MCA and ACA territory infarct is again noted. There is diffuse hypoattenuation involving affected areas of the left frontal and parietal lobe. The superior and anterior temporal lobe is involved. Contrast and hemorrhage within the sylvian fissure and along the left MCA is again noted. No new hemorrhage is present. There is increasing mass effect with effacement of the left lateral ventricle and now 10 mm of midline shift. There is increasing prominence of the right temporal horn compatible with mass effect on the lateral ventricles. Effacement  of the para mesencephalic cistern on the right suggest developing uncal herniation. Brainstem and cerebellum are unremarkable. Vascular: Hyperdense left MCA and branches compatible with contrast and thrombus. Skull: Calvarium is intact. No focal lytic or blastic lesions are present. Sinuses/Orbits: Increased fluid is present in the left sphenoid sinus. There is mild mucosal thickening of the left maxillary sinus. Globes and orbits are within normal limits. IMPRESSION: 1. Evolving left MCA and ACA territory infarct increasing mass effect and midline shift, now measuring 10 mm. 2. Mild increasing dilation of the right temporal tip compatible with left-sided effacement. 3. Developing uncal herniation without significant downward herniation of the brainstem. 4. Subarachnoid contrast and hemorrhage again noted within the sylvian fissure. Electronically Signed   By: [place] More[place] M.D.   On: 07/29/2017 06:40   Mr Brain Wo Contrast  Result Date: 07/28/2017 CLINICAL DATA:  Follow-up LEFT-sided stroke. Status post tPA July 27, 2017. EXAM: MRI HEAD WITHOUT CONTRAST TECHNIQUE: Multiplanar, multiecho pulse sequences of the brain and surrounding structures were obtained without intravenous contrast. COMPARISON:  CT HEAD July 28, 2017 at 0852 hours FINDINGS: BRAIN: Confluent LEFT frontotemporal parietal and LEFT basal ganglia reduced diffusion with involvement of the mesial LEFT parietal and frontal lobes. Low ADC values. Susceptibility artifact within LEFT sylvian fissure and LEFT frontal parietal sulci. No intraparenchymal hemorrhage. Worsening 8 mm LEFT-to-RIGHT midline shift, increased from 2 mm. Partially effaced LEFT lateral ventricle with early RIGHT lateral ventricle entrapment. 11 mm T2 bright simple cyst within or adjacent to splenium of corpus callosum. No significant extra-axial fluid accumulation. VASCULAR: Normal major intracranial vascular flow voids present at skull base. SKULL AND UPPER CERVICAL  SPINE: No abnormal sellar expansion. No suspicious calvarial bone marrow signal. Craniocervical junction maintained. SINUSES/ORBITS: Secretions layering in scratches at layering secretions in paranasal sinuses. Life-support lines in place. Mastoid air cells are well aerated. The included ocular globes and orbital contents are non-suspicious. OTHER: None. IMPRESSION: 1. Evolving acute large LEFT MCA territory nonhemorrhagic infarct. Evolving acute distal LEFT ACA territory nonhemorrhagic infarct. 2. Worsening 8 mm LEFT-to-RIGHT midline shift with early RIGHT ventricle entrapment. 3. Susceptibility artifact LEFT sylvian fissure and LEFT sulci corresponding to known extra-axial hemorrhage. Acute findings text paged to [place] via AMION secure system on 07/28/2017 at 10:37 pm. Electronically Signed   By: Elon Alas M.D.   On: 07/28/2017 22:38    PHYSICAL EXAM  Temp:  [98.4 F (36.9 C)-100.4 F (38 C)] 100.4 F (38 C) (01/14 0800) Pulse Rate:  [49-112] 86 (01/14 0800) Resp:  [13-22] 21 (01/14 0800) BP: (123-154)/(82-102) 151/96 (01/14 0800) SpO2:  [99 %-100 %] 100 % (01/14 0800) FiO2 (%):  [40 %] 40 % (01/14 0739) Weight:  [188 lb 4.4 oz (85.4 kg)] 188 lb 4.4 oz (85.4 kg) (01/14 0500)  General - Well nourished, well developed,  intubated.  Ophthalmologic - Fundi not visualized due to small pupils.  Cardiovascular - Regular rate and rhythm.  Neuro - intubated, on low dose sedation, lethargic, not open eyes to voice, PERRLA, left gaze preference, not able to cross midline, blinking to visual threat on the left, but not able to the left.  Able to follow all simple commands.  Facial symmetry not able to test due to ET tube.  Left upper extremity and lower extremity at least 4/5, right upper and lower extremity dense hemiplegia.  DTR 1+, no Babinski.  Sensation and coordination not cooperative.  Gait not tested.   ASSESSMENT/PLAN [name] is a 42 y.o. male with history of HTN, OSA  presenting with right-sided weakness, left gaze and right neglect. He receive IV t-PA.  However, unsuccessful for mechanical thrombectomy with complication of left SAH.  Stroke: Left large MCA infarct status post TPA with unsuccessful IR attempt causing left SAH.  Resultant  left gaze, right hemiplegia, right neglect  CT head left MCA  hyperdense  CT head and neck left MCA occlusion  DSA with unsuccessful IR attempt causing left SAH  CT repeat showed left MCA large infarct with left stable SAH and stable subtle midline shift  MRI head large left MCA and ACA infarct with midline shift  2D Echo - EF 60-65%  LE venous Doppler no DVT  Consider TEE later when stable  LDL NTC due to high TG  HgbA1c - 5.2  UDS negative  VTE prophylaxis -SCDs Diet NPO time specified  No antithrombotic prior to admission, now on No antithrombotic due to [place] [facility]  Ongoing aggressive stroke risk factor management  Therapy recommendations:  pending  Disposition:  Pending  Cerebral edema s/p decompressive hemicrani  CT head showed large left MCA infarct  MRI large left MCA and ACA infarcts with midline shift  CT repeat - increasing midline shift  CT repeat pending in am  on 3% saline  Na 144->150  Sodium goal 150 and 155  23.4% saline PRN  Neurosurgery on board - s/p hemi craniectomy - flat not tense  Hypertension  Stable  Hyperlipidemia  Home meds: None  LDL NTC due to high TG, goal < 70  TG 417  On zetia and lipitor  Continue on discharge  Other Stroke Risk Factors  Obstructive sleep apnea  Other Active Problems  intubated  Hospital day # 3  This patient is critically ill due to large left MCA infarct, SAH, cerebral edema and at significant risk of neurological worsening, death form brain herniation, seizure, heart failure. This patient's care requires constant monitoring of vital signs, hemodynamics, respiratory and cardiac monitoring, review of multiple  databases, neurological assessment, discussion with family, other specialists and medical decision making of high complexity. I had long discussion with mom at bedside, updated pt current condition, treatment plan and potential prognosis. She expressed understanding and appreciation. I also discussed with Dr. Kathyrn Sheriff and Dr. Carson Myrtle in the hallway. I spent 40 minutes of neurocritical care time in the care of this patient.  Rosalin Hawking, MD PhD Stroke Neurology 07/30/2017 10:27 AM   To contact Stroke Continuity provider, please refer to [email or web address] After hours, contact General Neurology

## 2017-07-30 NOTE — Progress Notes (Signed)
No issues overnight.   EXAM:  BP (!) 148/94   Pulse 92   Temp 99.1 F (37.3 C) (Axillary)   Resp 18   Ht 5\' 10"  (1.778 m)   Wt 85.4 kg (188 lb 4.4 oz)   SpO2 100%   BMI 27.01 kg/m   Opens eyes to voice Left gaze deviation Breathing spontaneously  Follows commands briskly LUE/LLE No movement RUE/RLE Scalp/abd wounds c/d/i  IMPRESSION:  42 y.o. male POD#1 left hemicraniectomy for stroke, at preop baseline  PLAN: - Cont stroke mgmt per neurology - Can have staples out in 2 weeks.

## 2017-07-31 ENCOUNTER — Inpatient Hospital Stay (HOSPITAL_COMMUNITY): Payer: BLUE CROSS/BLUE SHIELD

## 2017-07-31 ENCOUNTER — Encounter (HOSPITAL_COMMUNITY): Payer: Self-pay | Admitting: Interventional Radiology

## 2017-07-31 LAB — CBC
HEMATOCRIT: 33 % — AB (ref 39.0–52.0)
Hemoglobin: 10.5 g/dL — ABNORMAL LOW (ref 13.0–17.0)
MCH: 30 pg (ref 26.0–34.0)
MCHC: 31.8 g/dL (ref 30.0–36.0)
MCV: 94.3 fL (ref 78.0–100.0)
Platelets: 193 10*3/uL (ref 150–400)
RBC: 3.5 MIL/uL — ABNORMAL LOW (ref 4.22–5.81)
RDW: 13.9 % (ref 11.5–15.5)
WBC: 10.6 10*3/uL — ABNORMAL HIGH (ref 4.0–10.5)

## 2017-07-31 LAB — GLUCOSE, CAPILLARY
GLUCOSE-CAPILLARY: 117 mg/dL — AB (ref 65–99)
GLUCOSE-CAPILLARY: 119 mg/dL — AB (ref 65–99)
GLUCOSE-CAPILLARY: 120 mg/dL — AB (ref 65–99)
GLUCOSE-CAPILLARY: 157 mg/dL — AB (ref 65–99)
Glucose-Capillary: 103 mg/dL — ABNORMAL HIGH (ref 65–99)
Glucose-Capillary: 132 mg/dL — ABNORMAL HIGH (ref 65–99)
Glucose-Capillary: 141 mg/dL — ABNORMAL HIGH (ref 65–99)

## 2017-07-31 LAB — CARDIOLIPIN ANTIBODIES, IGG, IGM, IGA
Anticardiolipin IgG: <9 GPL U/mL (ref 0–14)
Anticardiolipin IgM: 9 MPL U/mL (ref 0–12)

## 2017-07-31 LAB — BASIC METABOLIC PANEL
Anion gap: 8 (ref 5–15)
BUN: 16 mg/dL (ref 6–20)
CALCIUM: 8.1 mg/dL — AB (ref 8.9–10.3)
CO2: 24 mmol/L (ref 22–32)
CREATININE: 0.88 mg/dL (ref 0.61–1.24)
Chloride: 121 mmol/L — ABNORMAL HIGH (ref 101–111)
GFR calc Af Amer: >60 mL/min (ref >60)
GFR calc non Af Amer: >60 mL/min (ref >60)
Glucose, Bld: 116 mg/dL — ABNORMAL HIGH (ref 65–99)
Potassium: 3.1 mmol/L — ABNORMAL LOW (ref 3.5–5.1)
Sodium: 153 mmol/L — ABNORMAL HIGH (ref 135–145)

## 2017-07-31 LAB — BETA-2-GLYCOPROTEIN I ABS, IGG/M/A
Beta-2 Glyco I IgG: <9 GPI IgG units (ref 0–20)
Beta-2-Glycoprotein I IgA: <9 GPI IgA units (ref 0–25)
Beta-2-Glycoprotein I IgM: <9 GPI IgM units (ref 0–32)

## 2017-07-31 LAB — LUPUS ANTICOAGULANT PANEL
DRVVT: 48 s — ABNORMAL HIGH (ref 0.0–47.0)
PTT LA: 33.5 s (ref 0.0–51.9)

## 2017-07-31 LAB — SEDIMENTATION RATE: Sed Rate: 70 mm/hr — ABNORMAL HIGH (ref 0–16)

## 2017-07-31 LAB — DRVVT MIX: dRVVT Mix: 41.8 s (ref 0.0–47.0)

## 2017-07-31 LAB — TRIGLYCERIDES: Triglycerides: 316 mg/dL — ABNORMAL HIGH (ref <150)

## 2017-07-31 LAB — SODIUM
SODIUM: 152 mmol/L — AB (ref 135–145)
Sodium: 154 mmol/L — ABNORMAL HIGH (ref 135–145)
Sodium: 153 mmol/L — ABNORMAL HIGH (ref 135–145)
Sodium: 152 mmol/L — ABNORMAL HIGH (ref 135–145)

## 2017-07-31 LAB — HIV ANTIBODY (ROUTINE TESTING W REFLEX): HIV SCREEN 4TH GENERATION: NONREACTIVE

## 2017-07-31 MED ORDER — ASPIRIN EC 325 MG PO TBEC
325.0000 mg | DELAYED_RELEASE_TABLET | Freq: Every day | ORAL | Status: DC
  Administered 2017-07-31 – 2017-08-01 (×2): 325 mg via ORAL
  Filled 2017-07-31 (×3): qty 1

## 2017-07-31 MED ORDER — ENOXAPARIN SODIUM 40 MG/0.4ML ~~LOC~~ SOLN
40.0000 mg | SUBCUTANEOUS | Status: DC
  Administered 2017-07-31 – 2017-08-06 (×7): 40 mg via SUBCUTANEOUS
  Filled 2017-07-31 (×7): qty 0.4

## 2017-07-31 MED ORDER — BISACODYL 10 MG RE SUPP
10.0000 mg | Freq: Every day | RECTAL | Status: DC | PRN
  Administered 2017-08-01: 10 mg via RECTAL
  Filled 2017-07-31: qty 1

## 2017-07-31 MED ORDER — INSULIN ASPART 100 UNIT/ML ~~LOC~~ SOLN
1.0000 [IU] | SUBCUTANEOUS | Status: DC
  Administered 2017-07-31: 2 [IU] via SUBCUTANEOUS
  Administered 2017-08-01 – 2017-08-06 (×11): 1 [IU] via SUBCUTANEOUS

## 2017-07-31 MED ORDER — POTASSIUM CHLORIDE 20 MEQ/15ML (10%) PO SOLN
40.0000 meq | Freq: Once | ORAL | Status: AC
  Administered 2017-07-31: 40 meq
  Filled 2017-07-31: qty 30

## 2017-07-31 MED ORDER — CYANOCOBALAMIN 1000 MCG/ML IJ SOLN
1000.0000 ug | Freq: Every day | INTRAMUSCULAR | Status: AC
  Administered 2017-07-31 – 2017-08-06 (×7): 1000 ug via INTRAMUSCULAR
  Filled 2017-07-31 (×7): qty 1

## 2017-07-31 MED ORDER — FENTANYL CITRATE (PF) 100 MCG/2ML IJ SOLN
100.0000 ug | INTRAMUSCULAR | Status: DC | PRN
  Administered 2017-08-01 – 2017-08-02 (×4): 100 ug via INTRAVENOUS
  Filled 2017-07-31 (×4): qty 2

## 2017-07-31 NOTE — Progress Notes (Signed)
OT Cancellation    07/31/17 1100  OT Visit Information  Last OT Received On 07/31/17  Reason Eval/Treat Not Completed Patient not medically ready. Pt continues to remain on vent and is unstable. Will sign off and please reconsult once medically stable. Thank you.   Keiarah Orlowski MSOT, OTR/L Acute Rehab Pager: [phone] Office: [phone]

## 2017-07-31 NOTE — Progress Notes (Signed)
PT Cancellation Note/ Discharge  Patient Details Name: Jeremy [name] MRN: [id number] DOB: [birth year]   Cancelled Treatment:    Reason Eval/Treat Not Completed: Medical issues which prohibited therapy(pt remains on vent, unstable and 4th day of inability to see pt. Will sign off and await new order when medically stable)   Siyona Coto B Cathrine Krizan 07/31/2017, 7:38 AM  Delaney MeigsMaija Tabor Kirke Breach, PT [phone]

## 2017-07-31 NOTE — Progress Notes (Signed)
PULMONARY / CRITICAL CARE MEDICINE   Name: Jeremy [name] MRN: [id number] DOB: 1976-07-13    ADMISSION DATE:  07/27/2017 CONSULTATION DATE:  07/27/2017   REFERRING MD:  Dr. Wilford Corner  CHIEF COMPLAINT:  Vent management post stroke  HISTORY OF PRESENT ILLNESS:   42 y/o male with OSA, HTN was admitted on 1/11 with abrupt onset R facial droop.  He was found to have evidence of a left MCA stroke. He was given IV TP and went unsuccessful IR attempt for thrombectomy.    Of note, patient reported headache, fever 103, and flu like symptoms for 2-3 days prior to presentation on 1/11.  WBC normal on admit, low grade temp.  SUBJECTIVE:  Weaned well yesterday for 12 hours on PSV 5/5 Tmax 101.4 overnight, foley placed to monitor fever and placement of artic sun blanket for fever control but d/c'd after shivering  WBC 10.6 Continues on 3% at 50 ml, Na 153 Propofol at 20 mcg/kg/min Per RN no acute changes overnight.   Repeat head CT done  VITAL SIGNS: BP 136/90   Pulse 78   Temp 100 F (37.8 C) (Rectal)   Resp 19   Ht 5\' 10"  (1.778 m)   Wt 180 lb 16 oz (82.1 kg)   SpO2 100%   BMI 25.97 kg/m   HEMODYNAMICS:    VENTILATOR SETTINGS: Vent Mode: PRVC FiO2 (%):  [40 %] 40 % Set Rate:  [14 bmp] 14 bmp Vt Set:  [580 mL] 580 mL PEEP:  [5 cmH20] 5 cmH20 Pressure Support:  [5 cmH20] 5 cmH20 Plateau Pressure:  [15 cmH20-16 cmH20] 16 cmH20  INTAKE / OUTPUT: I/O last 3 completed shifts: In: 3200.2 [I.V.:2114.2; NG/GT:1056; IV Piggyback:30] Out: 3450 [Urine:3450]  PHYSICAL EXAMINATION: General:  Critically ill adult male on MV in NAD HEENT: MM pink/moist, ETT/ OGT, pupils 5/ reactive- left gaze, left facial swelling- unable to open left eye,, left hemicrani site soft, dressing c/d/i Neuro: Awakens to verbal, follows commands on left arm and leg, flaccid on right CV:  rrr, no m/r/g, right CVC site wnl PULM: even/non-labored on MV,  lungs bilaterally CTA GI: soft, BS +, flap in RUQ dressing c/d/i   Extremities: warm/dry, no edema  Skin: no rashes  LABS:  BMET Recent Labs  Lab 07/29/17 0503  07/30/17 0440  07/30/17 1857 07/30/17 2323 07/31/17 0317  NA 144   < > 150*   < > 152* 152* 153*  K 3.8  --  3.6  --   --   --  3.1*  CL 112*  --  119*  --   --   --  121*  CO2 23  --  25  --   --   --  24  BUN 16  --  13  --   --   --  16  CREATININE 0.83  --  0.91  --   --   --  0.88  GLUCOSE 134*  --  149*  --   --   --  116*   < > = values in this interval not displayed.    Electrolytes Recent Labs  Lab 07/28/17 0259 07/29/17 0503 07/30/17 0440 07/31/17 0317  CALCIUM 8.2* 8.0* 8.1* 8.1*  MG 2.2  --   --   --   PHOS 3.2  --   --   --     CBC Recent Labs  Lab 07/29/17 0503 07/30/17 0440 07/31/17 0317  WBC 9.7 11.2* 10.6*  HGB 11.4* 10.5* 10.5*  HCT 34.0* 31.2* 33.0*  PLT 185 179 193    Coag's Recent Labs  Lab 07/27/17 2055  APTT 25  INR 0.98    Sepsis Markers No results for input(s): LATICACIDVEN, PROCALCITON, O2SATVEN in the last 168 hours.  ABG Recent Labs  Lab 07/28/17 0137  PHART 7.341*  PCO2ART 43.2  PO2ART 170.0*    Liver Enzymes Recent Labs  Lab 07/27/17 2055  AST 48*  ALT 98*  ALKPHOS 40  BILITOT 1.1  ALBUMIN 4.2    Cardiac Enzymes No results for input(s): TROPONINI, PROBNP in the last 168 hours.  Glucose Recent Labs  Lab 07/31/17 0600  GLUCAP 132*    Imaging Ct Head Wo Contrast  Result Date: 07/31/2017 CLINICAL DATA:  Stroke, follow-up craniotomy. EXAM: CT HEAD WITHOUT CONTRAST TECHNIQUE: Contiguous axial images were obtained from the base of the skull through the vertex without intravenous contrast. COMPARISON:  CT HEAD July 29, 2017 and MRI of the head July 28, 2017 FINDINGS: BRAIN: Mild external herniation LEFT cerebrum via craniectomy defect. Evolving large LEFT acute to subacute MCA territory infarct without hemorrhagic conversion. Moderate LEFT frontoparietal ACA territory infarct. Regional mass effect without  midline shift. Persistent partial LEFT lateral ventricle effacement. Resolving RIGHT ventricular entrapment. Mild residual LEFT uncal herniation. Patent basal cisterns. VASCULAR: Dense LEFT MCA consistent with thromboembolism and, potentially trapped extravasated contrast. Focally dense LEFT ACA. SKULL/SOFT TISSUES: Interval LEFT decompressive craniectomy. LEFT scalp soft tissue swelling, subcutaneous gas with overlying skin staples. ORBITS/SINUSES: The included ocular globes and orbital contents are normal.Mild paranasal sinus mucosal thickening. LEFT sphenoid sinus air-fluid level. Mastoid air cells are well aerated. OTHER: Life-support lines in place. IMPRESSION: 1. Evolving large LEFT MCA and moderate LEFT ACA territory infarct without hemorrhagic conversion. 2. Interval decompressive LEFT craniectomy with mild external herniation of LEFT cerebrum. Resolution of midline shift. 3. Resolving RIGHT ventricular entrapment. Electronically Signed   By: Awilda Metro M.D.   On: 07/31/2017 04:26     STUDIES:  1/11 CT head L MCA sign 1/11 CT angio neck/head> L M1 MCA occlusion 1/12 CT head :  CT head : left MCA distribution infarct, some extravasation of contrast in sylvian fissure, small volume subarachnoid hemorrhage, fluid in sinuses  1/12 CT head >>  Progressive L MCA territory infarct with further involvement of left caudate head and lentiform nucleus; extensive nonhemorrhagic infarcts of left temporal tip, left frontal operculum, left parietal lobe, medial left front lobe distal left ACA territory; contrast extravasation compatible w/ perforation into left sylvian fissure, sulci, and interpeduncular notch; minimal midline shift  1/12 MRI Brain >> 1. Evolving acute large LEFT MCA territory nonhemorrhagic infarct. Evolving acute distal LEFT ACA territory nonhemorrhagic infarct. 2. Worsening 8 mm LEFT-to-RIGHT midline shift with early RIGHT ventricle entrapment. 3. Susceptibility artifact LEFT  sylvian fissure and LEFT sulci corresponding to known extra-axial hemorrhage.  1/13 CT head >> 1. Evolving left MCA and ACA territory infarct increasing mass effect and midline shift, now measuring 10 mm. 2. Mild increasing dilation of the right temporal tip compatible with left-sided effacement. 3. Developing uncal herniation without significant downward herniation of the brainstem. 4. Subarachnoid contrast and hemorrhage again noted within the sylvian fissure.  TTE 1/13 >> EF 60-65%, mild LVH  1/15 CT head >>  1. Evolving large LEFT MCA and moderate LEFT ACA territory infarct without hemorrhagic conversion. 2. Interval decompressive LEFT craniectomy with mild external herniation of LEFT cerebrum. Resolution of midline shift. 3. Resolving RIGHT ventricular entrapment.  CULTURES: MRSA PCR 1/12 >>  [facility]atives [facility] >>  ANTIBIOTICS: Cefazolin 1/11 >> 1/13  SIGNIFICANT EVENTS: 1/11 admission for R MCA stroke: IV tPA and failed IR Thrombectomy 1/12 3% Na started 1/13 Left decompressive hemicraniectomy  LINES/TUBES: 1/11 ETT >  1/11 OGT > 1/11 Foley >> 1/15 1/11 R [place] CVC >>   DISCUSSION: 42 yo male with subjective fever 103 and headache at home 2-3 days prior to presenting on 1/11 with right sided weakness found to have an acute L MCA stroke, had IR thrombectomy post TPA. Some small subarachnoid bleeding and extravasation post thromobectomy.  CT imaging from 1/12 shows evolving signs of MCA stroke. Taken for decompressive hemicrani 1/13.  Weaning well, following commands on left 1/14, but remains intubated for concern of ICP.  ASSESSMENT / PLAN:  PULMONARY A:  Acute respiratory failure with hypoxemia in the setting of CVA OSA P:  Full mechanical vent support PRVC 8 cc/kg Daily SBT- weaned 12 hrs on PSV 5/5 yesterday.  Anticipate he will wean well again today but concerned if he will be able to protect his airway/ secretions as he is unable to cough, stick out tongue on  exam; ongoing daily assessment, but need to consider early trach  CXR reviewed- no acute findings, ETT 5.2 above carina Trend CXR/ ABG VAP prevention  CARDIOVASCULAR A:  Acute MCA stroke Hx HTN - TTE 1/13 without acute findings or defects in atrial septum, no PFO P:  Tele MAP goal >65; SBP < 180 Consider TEE when able Continue lipitor and zetia per Neuro  RENAL A:  Hypernatremia- at goal Na 150-155 on 3%  P:  Trend Na q 6 KCL 40 meq x 1  D/c foley- artic sun blanket d/c'd- place condom cath, observe for retention Monitor BMET/ mag/ phos/  UOP and daily weights Replace electrolytes as needed  GASTROINTESTINAL A:  No acute issues P:  Continue TF   Pantoprazole for stress ulcer prophylaxis Bowel regimen for PAD protocol   HEMATOLOGIC A:  Mild leukocytosis  Mild anemia- stable Jehovah witness P:  Trend CBC Monitor for bleeding SCDs for VTE ppx for now given risk of hemorraghic conversion  INFECTIOUS A:  Subjective fever 103 x 2-3 w/headache prior to presenting with acute L MCA CVA - afebrile, normal WBC, neg CXR on admit - now with fever - ? Inflammatory reaction - no known prior respiratory/ URI symptoms P:  Monitor for fever/ trend WBC UA neg Follow blood cultures Tylenol prn  Assess PCT Consider viral testing, RVP  ENDOCRINE A: Mild hyperglycemia HgbA1c - 5.2 P:  CBG q 4 SSI sensitive   NEUROLOGIC A:  Acute L MCA stroke s/p TPA and failed IR attempt causing Left SAH Cerebreal Edema s/p L decompressive hemicraniectomy w/ bone flap in abd SQ pocket - ? Neuro infection vs inflammation with subjective fever/ ha prior to admit/ CVA; ddx temporal arteritis or vasculitis  - bilateral LE venous dopplers neg 1/12 - TTE showed no defect or PFO P:  RASS goal: -1 D/c propofol due to high triglycerides D/c morphine, change to PRN fentanyl, if more  Frequent neuro checks  Remainder per NSGY and Neuro Stroke workup  Goal Na 150-155, prn 23.4% saline  per Neuro Right foot boot PT/OT when able Further imaging per Neuro Fever control to reduce ICP    FAMILY  - Updates:  No family at bedside. Mother at bedside yesterday.  Of note, wife is apparently in [redacted] weeks pregnant.    - Inter-disciplinary family meet or Palliative Care meeting  due by:  1/19  CCT 40 mins  Posey Boyer, AGACNP-BC [facility] Pgr: [phone] or if no answer [phone] 07/31/2017, 7:23 AM

## 2017-07-31 NOTE — Progress Notes (Signed)
No issues overnight.   EXAM:  BP (!) 140/95   Pulse 87   Temp 99.5 F (37.5 C)   Resp (!) 21   Ht 5\' 10"  (1.778 m)   Wt 82.1 kg (180 lb 16 oz)   SpO2 99%   BMI 25.97 kg/m   Arouses to voice Breathing spontaneously FC briskly RUE/RLE Wound c/d/i  IMPRESSION:  42 y.o. male s/p left hemicraniectomy for complete LMCA territory stroke, at preop baseline.  PLAN: - Can remove dressings tomorrow - Staples can be removed in 2 weeks - Can consider cranioplasty in a few months depending on recovery. In the mean time, will likely need helmet in rehab.

## 2017-07-31 NOTE — Progress Notes (Signed)
STROKE TEAM PROGRESS NOTE   SUBJECTIVE (INTERVAL HISTORY) His mom is at the bedside.  Pt still intubated, following simple commands, slightly more awake today. CT head showed much improved midline shift after hemicrani. Still has significant edema. Had fever yesterday on arctic sun.   Past Medical History:  Diagnosis Date  . Anxiety   . Asthma    MILD AS A CHILD  . Depression   . Deviated septum   . Dyspnea    NORMAL SPIROMETRY 02/01/17 VISIT WITH DR [place]  . Hypertension   . Sleep apnea    FAMILY HISTORY History reviewed. No pertinent family history.  SOCIAL HISTORY  reports that  has never smoked. he has never used smokeless tobacco. He reports that he drinks alcohol. He reports that he does not use drugs.   HOME MEDICATIONS:  Current Meds  Medication Sig  . spironolactone (ALDACTONE) 100 MG tablet Take 100 mg by mouth daily.      HOSPITAL MEDICATIONS:  . atorvastatin  40 mg Oral q1800  . chlorhexidine gluconate (MEDLINE KIT)  15 mL Mouth Rinse BID  . Chlorhexidine Gluconate Cloth  6 each Topical Q0600  . Chlorhexidine Gluconate Cloth  6 each Topical Daily  . cyanocobalamin  1,000 mcg Intramuscular Daily  . ezetimibe  10 mg Oral Daily  . feeding supplement (PRO-STAT SUGAR FREE 64)  30 mL Per Tube Daily  . insulin aspart  1-3 Units Subcutaneous Q4H  . mouth rinse  15 mL Mouth Rinse 10 times per day  . pantoprazole sodium  40 mg Per Tube QHS  . sodium chloride flush  10-40 mL Intracatheter Q12H    OBJECTIVE Temp:  [99.5 F (37.5 C)-101.4 F (38.6 C)] 100 F (37.8 C) (01/15 1300) Pulse Rate:  [67-141] 83 (01/15 1300) Cardiac Rhythm: Normal sinus rhythm;Sinus tachycardia (01/15 0800) Resp:  [14-29] 25 (01/15 1300) BP: (114-180)/(76-113) 153/97 (01/15 1300) SpO2:  [90 %-100 %] 100 % (01/15 1300) FiO2 (%):  [40 %] 40 % (01/15 1234) Weight:  [180 lb 16 oz (82.1 kg)] 180 lb 16 oz (82.1 kg) (01/15 0443)  CBC:  Recent Labs  Lab 07/27/17 2055  07/28/17 0259   07/30/17 0440 07/31/17 0317  WBC 6.6  --  8.0   < > 11.2* 10.6*  NEUTROABS 3.2  --  6.3  --   --   --   HGB 16.1   < > 13.2   < > 10.5* 10.5*  HCT 45.5   < > 37.9*   < > 31.2* 33.0*  MCV 88.0  --  87.5   < > 93.1 94.3  PLT 228  --  203   < > 179 193   < > = values in this interval not displayed.    Basic Metabolic Panel:  Recent Labs  Lab 07/28/17 0259  07/30/17 0440  07/31/17 0317 07/31/17 1100  NA 136   < > 150*   < > 153* 154*  K 3.9   < > 3.6  --  3.1*  --   CL 105   < > 119*  --  121*  --   CO2 24   < > 25  --  24  --   GLUCOSE 123*   < > 149*  --  116*  --   BUN 18   < > 13  --  16  --   CREATININE 0.95   < > 0.91  --  0.88  --   CALCIUM 8.2*   < >  8.1*  --  8.1*  --   MG 2.2  --   --   --   --   --   PHOS 3.2  --   --   --   --   --    < > = values in this interval not displayed.    Lipid Panel:     Component Value Date/Time   CHOL 218 (H) 07/28/2017 0259   TRIG 316 (H) 07/30/2017 2323   HDL 28 (L) 07/28/2017 0259   CHOLHDL 7.8 07/28/2017 0259   VLDL UNABLE TO CALCULATE IF TRIGLYCERIDE OVER 400 mg/dL 07/28/2017 0259   LDLCALC UNABLE TO CALCULATE IF TRIGLYCERIDE OVER 400 mg/dL 07/28/2017 0259   HgbA1c:  Lab Results  Component Value Date   HGBA1C 5.2 07/28/2017   Urine Drug Screen:     Component Value Date/Time   LABOPIA NONE DETECTED 07/28/2017 0415   COCAINSCRNUR NONE DETECTED 07/28/2017 0415   LABBENZ NONE DETECTED 07/28/2017 0415   AMPHETMU NONE DETECTED 07/28/2017 0415   [place] DETECTED 07/28/2017 0415   LABBARB NONE DETECTED 07/28/2017 0415    Alcohol Level No results found for: [place] I have personally reviewed the radiological images below and agree with the radiology interpretations.  Ct Angio Head W Or Wo Contrast  Addendum Date: 07/27/2017   ADDENDUM REPORT: 07/27/2017 21:47 CONTRAST:  Total of 200 cc Omnipaque 370 administered. Electronically Signed   By: Kristine Garbe M.D.   On: 07/27/2017 21:47   Result Date:  07/27/2017 CLINICAL DATA:  42 y/o  M; right-sided numbness, acute stroke. EXAM: CT ANGIOGRAPHY HEAD AND NECK CT PERFUSION BRAIN TECHNIQUE: Multidetector CT imaging of the head and neck was performed using the standard protocol during bolus administration of intravenous contrast. Multiplanar CT image reconstructions and MIPs were obtained to evaluate the vascular anatomy. Carotid stenosis measurements (when applicable) are obtained utilizing NASCET criteria, using the distal internal carotid diameter as the denominator. Multiphase CT imaging of the brain was performed following IV bolus contrast injection. Subsequent parametric perfusion maps were calculated using RAPID software. CONTRAST:  See addendum. COMPARISON:  07/27/2016 CT head FINDINGS: CTA NECK FINDINGS Aortic arch: Standard branching. Imaged portion shows no evidence of aneurysm or dissection. No significant stenosis of the major arch vessel origins. Right carotid system: No evidence of dissection, stenosis (50% or greater) or occlusion. Left carotid system: No evidence of dissection, stenosis (50% or greater) or occlusion. Vertebral arteries: Right dominant. No evidence of dissection, stenosis (50% or greater) or occlusion. Skeleton: Negative. Other neck: Nodules in bilateral lobes of thyroid measuring up to 2.6 cm on the right (series 7 image 149). Upper chest: Negative. Review of the MIP images confirms the above findings CTA HEAD FINDINGS Anterior circulation: Left proximal M1 occlusion with intermediate left MCA distribution collateralization. Otherwise no significant stenosis, proximal occlusion, aneurysm, or vascular malformation in the anterior circulation. Posterior circulation: No significant stenosis, proximal occlusion, aneurysm, or vascular malformation. Venous sinuses: As permitted by contrast timing, patent. Anatomic variants: Bilateral fetal PCA. Small anterior communicating artery. Review of the MIP images confirms the above findings CT  Brain Perfusion Findings: CBF (<30%) Volume: 85m Perfusion (Tmax>6.0s) volume: 1563mMismatch Volume: 7521mnfarction Location:Left MCA distribution IMPRESSION: CTA head: 1. Left M1 occlusion with intermediate left MCA distribution collateralization. 2. Otherwise patent anterior and posterior intracranial circulation. No additional large vessel occlusion, aneurysm, or significant stenosis is identified. CTA neck: 1. Patent carotid and vertebral arteries. No dissection, aneurysm, or significant stenosis by NASCET  criteria. 2. Nodules in bilateral lobes of thyroid measuring up to 2.6 cm on the right. Thyroid ultrasound is recommended on a nonemergent basis. CT perfusion head: By automated RAPID quantification: Infarct core 82 cc, ischemic penumbra 75 cc, infarct location in left MCA distribution. These results were called by telephone at the time of interpretation on 07/27/2017 at 9:23 pm to Dr. Amie [place] , who verbally acknowledged these results. Electronically Signed: By: Kristine Garbe M.D. On: 07/27/2017 21:39   Ct Head Wo Contrast  Result Date: 07/28/2017 CLINICAL DATA:  Follow-up suspected LEFT middle cerebral artery extravasation during incomplete thrombectomy. EXAM: CT HEAD WITHOUT CONTRAST TECHNIQUE: Contiguous axial images were obtained from the base of the skull through the vertex without intravenous contrast. COMPARISON:  Multiple CT HEAD May 27, 2018 FINDINGS: BRAIN: Increased density LEFT MCA distribution extending into LEFT insula and LEFT frontoparietal sulci measuring to 91 Hounsfield units. Faint density interpeduncular cistern measuring 50 Hounsfield units. Confluent LEFT frontotemporal parietal hypodensity extending to LEFT basal ganglia. No convincing evidence of intraparenchymal hemorrhage. Regional LEFT cerebrum mass effect without midline shift. No hydrocephalus. Basal cisterns are patent. 1 cm cyst in caudal aspect splenium of corpus callosum versus pineal cyst, less  likely. VASCULAR: Dense LEFT MCA (55 Hounsfield units). New density along anterior cerebral artery distribution. SKULL/SOFT TISSUES: No skull fracture. No significant soft tissue swelling. ORBITS/SINUSES: The included ocular globes and orbital contents are normal.Mild paranasal sinusitis. Life-support lines in place. Mastoid air cells are well aerated. OTHER: None. IMPRESSION: 1. Evolving acute large LEFT MCA territory infarct without hemorrhagic conversion. 2. Postprocedural extra-axial contrast extravasation in addition to probable contrast staining and small volume subarachnoid hemorrhage. 3. New density anterior cerebral artery concerning for thromboembolism. Critical Value/emergent results were called by telephone at the time of interpretation on 07/28/2017 at 4:00 am to Dr. Amie Portland , who verbally acknowledged these results. Electronically Signed   By: Elon Alas M.D.   On: 07/28/2017 04:16   Ct Angio Neck W Or Wo Contrast  Addendum Date: 07/27/2017   ADDENDUM REPORT: 07/27/2017 21:47 CONTRAST:  Total of 200 cc Omnipaque 370 administered. Electronically Signed   By: Kristine Garbe M.D.   On: 07/27/2017 21:47   Result Date: 07/27/2017 CLINICAL DATA:  42 y/o  M; right-sided numbness, acute stroke. EXAM: CT ANGIOGRAPHY HEAD AND NECK CT PERFUSION BRAIN TECHNIQUE: Multidetector CT imaging of the head and neck was performed using the standard protocol during bolus administration of intravenous contrast. Multiplanar CT image reconstructions and MIPs were obtained to evaluate the vascular anatomy. Carotid stenosis measurements (when applicable) are obtained utilizing NASCET criteria, using the distal internal carotid diameter as the denominator. Multiphase CT imaging of the brain was performed following IV bolus contrast injection. Subsequent parametric perfusion maps were calculated using RAPID software. CONTRAST:  See addendum. COMPARISON:  07/27/2016 CT head FINDINGS: CTA NECK FINDINGS  Aortic arch: Standard branching. Imaged portion shows no evidence of aneurysm or dissection. No significant stenosis of the major arch vessel origins. Right carotid system: No evidence of dissection, stenosis (50% or greater) or occlusion. Left carotid system: No evidence of dissection, stenosis (50% or greater) or occlusion. Vertebral arteries: Right dominant. No evidence of dissection, stenosis (50% or greater) or occlusion. Skeleton: Negative. Other neck: Nodules in bilateral lobes of thyroid measuring up to 2.6 cm on the right (series 7 image 149). Upper chest: Negative. Review of the MIP images confirms the above findings CTA HEAD FINDINGS Anterior circulation: Left proximal M1 occlusion with intermediate left MCA  distribution collateralization. Otherwise no significant stenosis, proximal occlusion, aneurysm, or vascular malformation in the anterior circulation. Posterior circulation: No significant stenosis, proximal occlusion, aneurysm, or vascular malformation. Venous sinuses: As permitted by contrast timing, patent. Anatomic variants: Bilateral fetal PCA. Small anterior communicating artery. Review of the MIP images confirms the above findings CT Brain Perfusion Findings: CBF (<30%) Volume: 57m Perfusion (Tmax>6.0s) volume: 1524mMismatch Volume: 7556mnfarction Location:Left MCA distribution IMPRESSION: CTA head: 1. Left M1 occlusion with intermediate left MCA distribution collateralization. 2. Otherwise patent anterior and posterior intracranial circulation. No additional large vessel occlusion, aneurysm, or significant stenosis is identified. CTA neck: 1. Patent carotid and vertebral arteries. No dissection, aneurysm, or significant stenosis by NASCET criteria. 2. Nodules in bilateral lobes of thyroid measuring up to 2.6 cm on the right. Thyroid ultrasound is recommended on a nonemergent basis. CT perfusion head: By automated RAPID quantification: Infarct core 82 cc, ischemic penumbra 75 cc, infarct  location in left MCA distribution. These results were called by telephone at the time of interpretation on 07/27/2017 at 9:23 pm to Dr. ASHAmie Portlandwho verbally acknowledged these results. Electronically Signed: By: LanKristine GarbeD. On: 07/27/2017 21:39   Ct Cerebral Perfusion W Contrast  Addendum Date: 07/27/2017   ADDENDUM REPORT: 07/27/2017 21:47 CONTRAST:  Total of 200 cc Omnipaque 370 administered. Electronically Signed   By: LanKristine GarbeD.   On: 07/27/2017 21:47   Result Date: 07/27/2017 CLINICAL DATA:  [age]  M; right-sided numbness, acute stroke. EXAM: CT ANGIOGRAPHY HEAD AND NECK CT PERFUSION BRAIN TECHNIQUE: Multidetector CT imaging of the head and neck was performed using the standard protocol during bolus administration of intravenous contrast. Multiplanar CT image reconstructions and MIPs were obtained to evaluate the vascular anatomy. Carotid stenosis measurements (when applicable) are obtained utilizing NASCET criteria, using the distal internal carotid diameter as the denominator. Multiphase CT imaging of the brain was performed following IV bolus contrast injection. Subsequent parametric perfusion maps were calculated using RAPID software. CONTRAST:  See addendum. COMPARISON:  07/27/2016 CT head FINDINGS: CTA NECK FINDINGS Aortic arch: Standard branching. Imaged portion shows no evidence of aneurysm or dissection. No significant stenosis of the major arch vessel origins. Right carotid system: No evidence of dissection, stenosis (50% or greater) or occlusion. Left carotid system: No evidence of dissection, stenosis (50% or greater) or occlusion. Vertebral arteries: Right dominant. No evidence of dissection, stenosis (50% or greater) or occlusion. Skeleton: Negative. Other neck: Nodules in bilateral lobes of thyroid measuring up to 2.6 cm on the right (series 7 image 149). Upper chest: Negative. Review of the MIP images confirms the above findings CTA HEAD FINDINGS  Anterior circulation: Left proximal M1 occlusion with intermediate left MCA distribution collateralization. Otherwise no significant stenosis, proximal occlusion, aneurysm, or vascular malformation in the anterior circulation. Posterior circulation: No significant stenosis, proximal occlusion, aneurysm, or vascular malformation. Venous sinuses: As permitted by contrast timing, patent. Anatomic variants: Bilateral fetal PCA. Small anterior communicating artery. Review of the MIP images confirms the above findings CT Brain Perfusion Findings: CBF (<30%) Volume: 32m31mrfusion (Tmax>6.0s) volume: 157mL72mmatch Volume: 75mL 12mrction Location:Left MCA distribution IMPRESSION: CTA head: 1. Left M1 occlusion with intermediate left MCA distribution collateralization. 2. Otherwise patent anterior and posterior intracranial circulation. No additional large vessel occlusion, aneurysm, or significant stenosis is identified. CTA neck: 1. Patent carotid and vertebral arteries. No dissection, aneurysm, or significant stenosis by NASCET criteria. 2. Nodules in bilateral lobes of thyroid measuring up to 2.6 cm on the  right. Thyroid ultrasound is recommended on a nonemergent basis. CT perfusion head: By automated RAPID quantification: Infarct core 82 cc, ischemic penumbra 75 cc, infarct location in left MCA distribution. These results were called by telephone at the time of interpretation on 07/27/2017 at 9:23 pm to Dr. Amie [place] , who verbally acknowledged these results. Electronically Signed: By: Kristine Garbe M.D. On: 07/27/2017 21:39   Dg Chest Port 1 View  Result Date: 07/28/2017 CLINICAL DATA:  43 year old male status post intubation. EXAM: PORTABLE CHEST 1 VIEW COMPARISON:  None. FINDINGS: An enteric tube is seen with side-port in the region of the gastroesophageal junction and tip in the proximal stomach. Recommend further advancing of the tube into the stomach by approximately 3-4 cm. A partially  visualized structure with tip at the level of the T1 may represent the endotracheal tube. Clinical correlation is recommended. If an endotracheal tube is present recommend further advancing the tube by approximately 3 cm for optimal positioning. The lungs are clear. There is no pleural effusion or pneumothorax. The cardiac silhouette is within normal limits. No acute osseous pathology. IMPRESSION: 1. Enteric tube with tip in the stomach and side-port in the region of the gastroesophageal junction. Recommend advancing the tube further into the stomach for optimal positioning. A partially visualized density over the T1 may represent the tip of the endotracheal tube. If an endotracheal tube is present recommend further advancing by approximately 3 cm for optimal positioning. 2. No acute cardiopulmonary process. Electronically Signed   By: Anner Crete M.D.   On: 07/28/2017 01:18   Ct Head Code Stroke Wo Contrast  Result Date: 07/27/2017 CLINICAL DATA:  Code stroke. 42 y/o M; right-sided weakness and facial droop. EXAM: CT HEAD WITHOUT CONTRAST TECHNIQUE: Contiguous axial images were obtained from the base of the skull through the vertex without intravenous contrast. COMPARISON:  None. FINDINGS: Brain: Faint lucency involving left insula and frontal operculum. No significant mass effect or hemorrhage at this time. No hydrocephalus, basilar cistern effacement, or extra-axial collection. Vascular: Dense left M1. Skull: Normal. Negative for fracture or focal lesion. Sinuses/Orbits: No acute finding. Other: None. ASPECTS [facility] Stroke Program Early CT Score) - Ganglionic level infarction (caudate, lentiform nuclei, internal capsule, insula, M1-M3 cortex): 5 - Supraganglionic infarction (M4-M6 cortex): 3 Total score (0-10 with 10 being normal): 8 IMPRESSION: 1. Left insula and frontal operculum lucency compatible with infarction. No hemorrhage or mass effect. 2. Left M1 hyperdensity, likely thrombus. 3. ASPECTS is  8 These results were called by telephone at the time of interpretation on 07/27/2017 at 9:10 pm to Dr. Rory Percy , who verbally acknowledged these results. Electronically Signed   By: Kristine Garbe M.D.   On: 07/27/2017 21:12   CT head 05/28/18 10am 1. Progressive left MCA territory infarct with further involvement of the left caudate head and lentiform nucleus. 2. Extensive nonhemorrhagic infarcts involving the left temporal tip, left frontal operculum, left parietal lobe, and medial left frontal lobe, the distal left ACA territory. 3. Contrast extravasation compatible with perforation into the left sylvian fissure, the sulci over the convexity, and the interpeduncular notch. 4. Minimal midline shift. 5. Fluid in the sinuses likely secondary to intubation.  Transthoracic Echocardiogram - Left ventricle: The cavity size was normal. Wall thickness was   increased in a pattern of mild LVH. Systolic function was normal.   The estimated ejection fraction was in the range of 60% to 65%.   Wall motion was normal; there were no regional wall motion  abnormalities. Left ventricular diastolic function parameters   were normal. - Atrial septum: No defect or patent foramen ovale was identified.  Bilateral LE venous Dopplers - No evidence of deep vein thrombosis or baker's cysts bilaterally.   Ct Head Wo Contrast  Result Date: 07/29/2017 CLINICAL DATA:  Stroke. EXAM: CT HEAD WITHOUT CONTRAST TECHNIQUE: Contiguous axial images were obtained from the base of the skull through the vertex without intravenous contrast. COMPARISON:  MRI brain/12/19.  CT head 07/28/2017. FINDINGS: Brain: A large left MCA and ACA territory infarct is again noted. There is diffuse hypoattenuation involving affected areas of the left frontal and parietal lobe. The superior and anterior temporal lobe is involved. Contrast and hemorrhage within the sylvian fissure and along the left MCA is again noted. No new hemorrhage is  present. There is increasing mass effect with effacement of the left lateral ventricle and now 10 mm of midline shift. There is increasing prominence of the right temporal horn compatible with mass effect on the lateral ventricles. Effacement of the para mesencephalic cistern on the right suggest developing uncal herniation. Brainstem and cerebellum are unremarkable. Vascular: Hyperdense left MCA and branches compatible with contrast and thrombus. Skull: Calvarium is intact. No focal lytic or blastic lesions are present. Sinuses/Orbits: Increased fluid is present in the left sphenoid sinus. There is mild mucosal thickening of the left maxillary sinus. Globes and orbits are within normal limits. IMPRESSION: 1. Evolving left MCA and ACA territory infarct increasing mass effect and midline shift, now measuring 10 mm. 2. Mild increasing dilation of the right temporal tip compatible with left-sided effacement. 3. Developing uncal herniation without significant downward herniation of the brainstem. 4. Subarachnoid contrast and hemorrhage again noted within the sylvian fissure. Electronically Signed   By: [place] M.D.   On: 07/29/2017 06:40   Mr Brain Wo Contrast  Result Date: 07/28/2017 CLINICAL DATA:  Follow-up LEFT-sided stroke. Status post tPA July 27, 2017. EXAM: MRI HEAD WITHOUT CONTRAST TECHNIQUE: Multiplanar, multiecho pulse sequences of the brain and surrounding structures were obtained without intravenous contrast. COMPARISON:  CT HEAD July 28, 2017 at 0852 hours FINDINGS: BRAIN: Confluent LEFT frontotemporal parietal and LEFT basal ganglia reduced diffusion with involvement of the mesial LEFT parietal and frontal lobes. Low ADC values. Susceptibility artifact within LEFT sylvian fissure and LEFT frontal parietal sulci. No intraparenchymal hemorrhage. Worsening 8 mm LEFT-to-RIGHT midline shift, increased from 2 mm. Partially effaced LEFT lateral ventricle with early RIGHT lateral ventricle  entrapment. 11 mm T2 bright simple cyst within or adjacent to splenium of corpus callosum. No significant extra-axial fluid accumulation. VASCULAR: Normal major intracranial vascular flow voids present at skull base. SKULL AND UPPER CERVICAL SPINE: No abnormal sellar expansion. No suspicious calvarial bone marrow signal. Craniocervical junction maintained. SINUSES/ORBITS: Secretions layering in scratches at layering secretions in paranasal sinuses. Life-support lines in place. Mastoid air cells are well aerated. The included ocular globes and orbital contents are non-suspicious. OTHER: None. IMPRESSION: 1. Evolving acute large LEFT MCA territory nonhemorrhagic infarct. Evolving acute distal LEFT ACA territory nonhemorrhagic infarct. 2. Worsening 8 mm LEFT-to-RIGHT midline shift with early RIGHT ventricle entrapment. 3. Susceptibility artifact LEFT sylvian fissure and LEFT sulci corresponding to known extra-axial hemorrhage. Acute findings text paged to [place] via AMION secure system on 07/28/2017 at 10:37 pm. Electronically Signed   By: Elon Alas M.D.   On: 07/28/2017 22:38   Ct Head Wo Contrast  Result Date: 07/31/2017 CLINICAL DATA:  Stroke, follow-up craniotomy. EXAM: CT HEAD WITHOUT CONTRAST TECHNIQUE:  Contiguous axial images were obtained from the base of the skull through the vertex without intravenous contrast. COMPARISON:  CT HEAD July 29, 2017 and MRI of the head July 28, 2017 FINDINGS: BRAIN: Mild external herniation LEFT cerebrum via craniectomy defect. Evolving large LEFT acute to subacute MCA territory infarct without hemorrhagic conversion. Moderate LEFT frontoparietal ACA territory infarct. Regional mass effect without midline shift. Persistent partial LEFT lateral ventricle effacement. Resolving RIGHT ventricular entrapment. Mild residual LEFT uncal herniation. Patent basal cisterns. VASCULAR: Dense LEFT MCA consistent with thromboembolism and, potentially trapped  extravasated contrast. Focally dense LEFT ACA. SKULL/SOFT TISSUES: Interval LEFT decompressive craniectomy. LEFT scalp soft tissue swelling, subcutaneous gas with overlying skin staples. ORBITS/SINUSES: The included ocular globes and orbital contents are normal.Mild paranasal sinus mucosal thickening. LEFT sphenoid sinus air-fluid level. Mastoid air cells are well aerated. OTHER: Life-support lines in place. IMPRESSION: 1. Evolving large LEFT MCA and moderate LEFT ACA territory infarct without hemorrhagic conversion. 2. Interval decompressive LEFT craniectomy with mild external herniation of LEFT cerebrum. Resolution of midline shift. 3. Resolving RIGHT ventricular entrapment. Electronically Signed   By: Elon Alas M.D.   On: 07/31/2017 04:26    PHYSICAL EXAM  Temp:  [99.5 F (37.5 C)-101.4 F (38.6 C)] 100 F (37.8 C) (01/15 1300) Pulse Rate:  [67-141] 83 (01/15 1300) Resp:  [14-29] 25 (01/15 1300) BP: (114-180)/(76-113) 153/97 (01/15 1300) SpO2:  [90 %-100 %] 100 % (01/15 1300) FiO2 (%):  [40 %] 40 % (01/15 1234) Weight:  [180 lb 16 oz (82.1 kg)] 180 lb 16 oz (82.1 kg) (01/15 0443)  General - Well nourished, well developed, intubated.  Ophthalmologic - Fundi not visualized due to eye movement.  Cardiovascular - Regular rate and rhythm.  Neuro - intubated, on low dose sedation, lethargic, but open right eye to voice, left eyelid swelling,  PERRL, left gaze preference, not able to cross midline, blinking to visual threat on the left, but not able to the left.  Able to follow all simple commands.  Facial symmetry not able to test due to ET tube.  Left upper extremity and lower extremity at least 4/5, right upper and lower extremity dense hemiplegia.  DTR 1+, no Babinski.  Sensation and coordination not cooperative.  Gait not tested.   ASSESSMENT/PLAN [name] is a 42 y.o. male with history of HTN, OSA presenting with right-sided weakness, left gaze and right neglect. He receive IV  t-PA.  However, unsuccessful for mechanical thrombectomy with complication of left SAH.  Stroke: Left large MCA infarct status post TPA with unsuccessful IR attempt causing left SAH.  Resultant  left gaze, right hemiplegia, right neglect  CT head left MCA  hyperdense  CT head and neck left MCA occlusion  DSA with unsuccessful IR attempt causing left SAH  CT repeat showed left MCA large infarct with left stable SAH and stable subtle midline shift  MRI head large left MCA and ACA infarct with midline shift  Repeat CT 07/31/17 - much improved midline shift after hemicrani  2D Echo - EF 60-65%  LE venous Doppler no DVT  Consider TEE later when stable  LDL NTC due to high TG  HgbA1c - 5.2  UDS negative  VTE prophylaxis -SCDs Diet NPO time specified  No antithrombotic prior to admission, now on ASA 31m.   Ongoing aggressive stroke risk factor management  Therapy recommendations:  pending  Disposition:  Pending  Cerebral edema s/p decompressive hemicrani  CT head showed large left MCA infarct  MRI  large left MCA and ACA infarcts with midline shift  CT repeat - increasing midline shift  CT repeat 07/31/17 - resolved midline shift  on 3% saline  Na 144->150->153  Sodium goal 150 - 155  23.4% saline PRN  Neurosurgery on board - s/p hemi craniectomy   Fever - likely related to surgery  Tmax 101.4  On temperature control protocol  On arctic sun  WBC 11.2->10.6  Hypertension  Stable  BP goal < 160  Hyperlipidemia  Home meds: None  LDL NTC due to high TG, goal < 70  TG 417  On zetia and lipitor  Continue on discharge  B12 deficiency  B12 161   B12 im supplement  Other Stroke Risk Factors  Obstructive sleep apnea  Other Active Problems  intubated  Hospital day # 4  This patient is critically ill due to large left MCA infarct, SAH, cerebral edema and at significant risk of neurological worsening, death form brain herniation,  seizure, heart failure. This patient's care requires constant monitoring of vital signs, hemodynamics, respiratory and cardiac monitoring, review of multiple databases, neurological assessment, discussion with family, other specialists and medical decision making of high complexity. I had long discussion with mom at bedside, updated pt current condition, treatment plan and potential prognosis. She expressed understanding and appreciation. I also discussed with Dr. Kathyrn Sheriff and Dr. Carson Myrtle in the hallway. I spent 35 minutes of neurocritical care time in the care of this patient.  Rosalin Hawking, MD PhD Stroke Neurology 07/31/2017 1:47 PM   To contact Stroke Continuity provider, please refer to [email or web address] After hours, contact General Neurology

## 2017-07-31 NOTE — Plan of Care (Signed)
Pt not able to participate in self-care. Able to communicate some needs. TF at goal.

## 2017-08-01 ENCOUNTER — Inpatient Hospital Stay (HOSPITAL_COMMUNITY): Payer: BLUE CROSS/BLUE SHIELD

## 2017-08-01 DIAGNOSIS — Z978 Presence of other specified devices: Secondary | ICD-10-CM

## 2017-08-01 DIAGNOSIS — R509 Fever, unspecified: Secondary | ICD-10-CM

## 2017-08-01 DIAGNOSIS — J9600 Acute respiratory failure, unspecified whether with hypoxia or hypercapnia: Secondary | ICD-10-CM

## 2017-08-01 DIAGNOSIS — T82538A Leakage of other cardiac and vascular devices and implants, initial encounter: Secondary | ICD-10-CM

## 2017-08-01 DIAGNOSIS — D72829 Elevated white blood cell count, unspecified: Secondary | ICD-10-CM

## 2017-08-01 LAB — GLUCOSE, CAPILLARY
GLUCOSE-CAPILLARY: 114 mg/dL — AB (ref 65–99)
GLUCOSE-CAPILLARY: 122 mg/dL — AB (ref 65–99)
GLUCOSE-CAPILLARY: 128 mg/dL — AB (ref 65–99)
GLUCOSE-CAPILLARY: 133 mg/dL — AB (ref 65–99)
Glucose-Capillary: 122 mg/dL — ABNORMAL HIGH (ref 65–99)
Glucose-Capillary: 139 mg/dL — ABNORMAL HIGH (ref 65–99)

## 2017-08-01 LAB — BASIC METABOLIC PANEL
Anion gap: 8 (ref 5–15)
BUN: 17 mg/dL (ref 6–20)
CALCIUM: 8.2 mg/dL — AB (ref 8.9–10.3)
CO2: 25 mmol/L (ref 22–32)
CREATININE: 0.78 mg/dL (ref 0.61–1.24)
Chloride: 119 mmol/L — ABNORMAL HIGH (ref 101–111)
GFR calc non Af Amer: >60 mL/min (ref >60)
GLUCOSE: 157 mg/dL — AB (ref 65–99)
Potassium: 3.4 mmol/L — ABNORMAL LOW (ref 3.5–5.1)
Sodium: 152 mmol/L — ABNORMAL HIGH (ref 135–145)

## 2017-08-01 LAB — ANCA TITERS

## 2017-08-01 LAB — RESPIRATORY PANEL BY PCR
Adenovirus: NOT DETECTED
Bordetella pertussis: NOT DETECTED
CORONAVIRUS 229E-RVPPCR: NOT DETECTED
CORONAVIRUS HKU1-RVPPCR: NOT DETECTED
Chlamydophila pneumoniae: NOT DETECTED
Coronavirus NL63: NOT DETECTED
Coronavirus OC43: NOT DETECTED
Influenza A: NOT DETECTED
Influenza B: NOT DETECTED
METAPNEUMOVIRUS-RVPPCR: NOT DETECTED
MYCOPLASMA PNEUMONIAE-RVPPCR: NOT DETECTED
PARAINFLUENZA VIRUS 2-RVPPCR: NOT DETECTED
Parainfluenza Virus 1: NOT DETECTED
Parainfluenza Virus 3: NOT DETECTED
Parainfluenza Virus 4: NOT DETECTED
Respiratory Syncytial Virus: NOT DETECTED
Rhinovirus / Enterovirus: NOT DETECTED

## 2017-08-01 LAB — SODIUM
SODIUM: 153 mmol/L — AB (ref 135–145)
SODIUM: 154 mmol/L — AB (ref 135–145)
Sodium: 157 mmol/L — ABNORMAL HIGH (ref 135–145)

## 2017-08-01 LAB — CBC
HEMATOCRIT: 30.2 % — AB (ref 39.0–52.0)
Hemoglobin: 9.8 g/dL — ABNORMAL LOW (ref 13.0–17.0)
MCH: 30 pg (ref 26.0–34.0)
MCHC: 32.5 g/dL (ref 30.0–36.0)
MCV: 92.4 fL (ref 78.0–100.0)
Platelets: 235 10*3/uL (ref 150–400)
RBC: 3.27 MIL/uL — ABNORMAL LOW (ref 4.22–5.81)
RDW: 14 % (ref 11.5–15.5)
WBC: 11.2 10*3/uL — ABNORMAL HIGH (ref 4.0–10.5)

## 2017-08-01 LAB — MAGNESIUM: Magnesium: 2.3 mg/dL (ref 1.7–2.4)

## 2017-08-01 LAB — RHEUMATOID FACTOR: Rhuematoid fact SerPl-aCnc: 17.9 IU/mL — ABNORMAL HIGH (ref 0.0–13.9)

## 2017-08-01 LAB — T4, FREE: Free T4: 1.04 ng/dL (ref 0.61–1.12)

## 2017-08-01 LAB — PHOSPHORUS: Phosphorus: 1.8 mg/dL — ABNORMAL LOW (ref 2.5–4.6)

## 2017-08-01 MED ORDER — POTASSIUM CHLORIDE 20 MEQ/15ML (10%) PO SOLN
40.0000 meq | Freq: Once | ORAL | Status: AC
  Administered 2017-08-01: 40 meq
  Filled 2017-08-01: qty 30

## 2017-08-01 MED ORDER — LABETALOL HCL 5 MG/ML IV SOLN
5.0000 mg | INTRAVENOUS | Status: DC | PRN
  Administered 2017-08-02: 10 mg via INTRAVENOUS
  Filled 2017-08-01: qty 4

## 2017-08-01 NOTE — Progress Notes (Signed)
STROKE TEAM PROGRESS NOTE   SUBJECTIVE (INTERVAL HISTORY) No family is at the bedside.  Pt still intubated, following simple commands, more awake today. Still has low grade fever but no source of infection. Will repeat CT in am.  Past Medical History:  Diagnosis Date  . Anxiety   . Asthma    MILD AS A CHILD  . Depression   . Deviated septum   . Dyspnea    NORMAL SPIROMETRY 02/01/17 VISIT WITH DR [place]  . Hypertension   . Sleep apnea    FAMILY HISTORY History reviewed. No pertinent family history.  SOCIAL HISTORY  reports that  has never smoked. he has never used smokeless tobacco. He reports that he drinks alcohol. He reports that he does not use drugs.   HOME MEDICATIONS:  Current Meds  Medication Sig  . spironolactone (ALDACTONE) 100 MG tablet Take 100 mg by mouth daily.      HOSPITAL MEDICATIONS:  . aspirin EC  325 mg Oral Daily  . atorvastatin  40 mg Oral q1800  . chlorhexidine gluconate (MEDLINE KIT)  15 mL Mouth Rinse BID  . Chlorhexidine Gluconate Cloth  6 each Topical Q0600  . Chlorhexidine Gluconate Cloth  6 each Topical Daily  . cyanocobalamin  1,000 mcg Intramuscular Daily  . enoxaparin (LOVENOX) injection  40 mg Subcutaneous Q24H  . ezetimibe  10 mg Oral Daily  . feeding supplement (PRO-STAT SUGAR FREE 64)  30 mL Per Tube Daily  . insulin aspart  1-3 Units Subcutaneous Q4H  . mouth rinse  15 mL Mouth Rinse 10 times per day  . pantoprazole sodium  40 mg Per Tube QHS  . sodium chloride flush  10-40 mL Intracatheter Q12H    OBJECTIVE Temp:  [98.8 F (37.1 C)-101.4 F (38.6 C)] 98.8 F (37.1 C) (01/16 1200) Pulse Rate:  [75-109] 80 (01/16 1200) Cardiac Rhythm: Normal sinus rhythm;Sinus tachycardia (01/16 0800) Resp:  [16-28] 18 (01/16 1200) BP: (136-163)/(89-112) 138/102 (01/16 1200) SpO2:  [92 %-100 %] 100 % (01/16 1200) FiO2 (%):  [30 %-40 %] 30 % (01/16 1147) Weight:  [193 lb 9 oz (87.8 kg)] 193 lb 9 oz (87.8 kg) (01/16 0500)  CBC:  Recent Labs   Lab 07/27/17 2055  07/28/17 0259  07/31/17 0317 08/01/17 0433  WBC 6.6  --  8.0   < > 10.6* 11.2*  NEUTROABS 3.2  --  6.3  --   --   --   HGB 16.1   < > 13.2   < > 10.5* 9.8*  HCT 45.5   < > 37.9*   < > 33.0* 30.2*  MCV 88.0  --  87.5   < > 94.3 92.4  PLT 228  --  203   < > 193 235   < > = values in this interval not displayed.    Basic Metabolic Panel:  Recent Labs  Lab 07/28/17 0259  07/31/17 0317  08/01/17 0433 08/01/17 1105  NA 136   < > 153*   < > 152* 157*  K 3.9   < > 3.1*  --  3.4*  --   CL 105   < > 121*  --  119*  --   CO2 24   < > 24  --  25  --   GLUCOSE 123*   < > 116*  --  157*  --   BUN 18   < > 16  --  17  --   CREATININE 0.95   < >  0.88  --  0.78  --   CALCIUM 8.2*   < > 8.1*  --  8.2*  --   MG 2.2  --   --   --  2.3  --   PHOS 3.2  --   --   --  1.8*  --    < > = values in this interval not displayed.    Lipid Panel:     Component Value Date/Time   CHOL 218 (H) 07/28/2017 0259   TRIG 316 (H) 07/30/2017 2323   HDL 28 (L) 07/28/2017 0259   CHOLHDL 7.8 07/28/2017 0259   VLDL UNABLE TO CALCULATE IF TRIGLYCERIDE OVER 400 mg/dL 07/28/2017 0259   LDLCALC UNABLE TO CALCULATE IF TRIGLYCERIDE OVER 400 mg/dL 07/28/2017 0259   HgbA1c:  Lab Results  Component Value Date   HGBA1C 5.2 07/28/2017   Urine Drug Screen:     Component Value Date/Time   LABOPIA NONE DETECTED 07/28/2017 0415   COCAINSCRNUR NONE DETECTED 07/28/2017 0415   LABBENZ NONE DETECTED 07/28/2017 0415   AMPHETMU NONE DETECTED 07/28/2017 0415   [place] DETECTED 07/28/2017 0415   LABBARB NONE DETECTED 07/28/2017 0415    Alcohol Level No results found for: [place] I have personally reviewed the radiological images below and agree with the radiology interpretations.  Ct Angio Head W Or Wo Contrast  Addendum Date: 07/27/2017   ADDENDUM REPORT: 07/27/2017 21:47 CONTRAST:  Total of 200 cc Omnipaque 370 administered. Electronically Signed   By: Kristine Garbe M.D.   On:  07/27/2017 21:47   Result Date: 07/27/2017 CLINICAL DATA:  42 y/o  M; right-sided numbness, acute stroke. EXAM: CT ANGIOGRAPHY HEAD AND NECK CT PERFUSION BRAIN TECHNIQUE: Multidetector CT imaging of the head and neck was performed using the standard protocol during bolus administration of intravenous contrast. Multiplanar CT image reconstructions and MIPs were obtained to evaluate the vascular anatomy. Carotid stenosis measurements (when applicable) are obtained utilizing NASCET criteria, using the distal internal carotid diameter as the denominator. Multiphase CT imaging of the brain was performed following IV bolus contrast injection. Subsequent parametric perfusion maps were calculated using RAPID software. CONTRAST:  See addendum. COMPARISON:  07/27/2016 CT head FINDINGS: CTA NECK FINDINGS Aortic arch: Standard branching. Imaged portion shows no evidence of aneurysm or dissection. No significant stenosis of the major arch vessel origins. Right carotid system: No evidence of dissection, stenosis (50% or greater) or occlusion. Left carotid system: No evidence of dissection, stenosis (50% or greater) or occlusion. Vertebral arteries: Right dominant. No evidence of dissection, stenosis (50% or greater) or occlusion. Skeleton: Negative. Other neck: Nodules in bilateral lobes of thyroid measuring up to 2.6 cm on the right (series 7 image 149). Upper chest: Negative. Review of the MIP images confirms the above findings CTA HEAD FINDINGS Anterior circulation: Left proximal M1 occlusion with intermediate left MCA distribution collateralization. Otherwise no significant stenosis, proximal occlusion, aneurysm, or vascular malformation in the anterior circulation. Posterior circulation: No significant stenosis, proximal occlusion, aneurysm, or vascular malformation. Venous sinuses: As permitted by contrast timing, patent. Anatomic variants: Bilateral fetal PCA. Small anterior communicating artery. Review of the MIP images  confirms the above findings CT Brain Perfusion Findings: CBF (<30%) Volume: 52m Perfusion (Tmax>6.0s) volume: 1511mMismatch Volume: 7577mnfarction Location:Left MCA distribution IMPRESSION: CTA head: 1. Left M1 occlusion with intermediate left MCA distribution collateralization. 2. Otherwise patent anterior and posterior intracranial circulation. No additional large vessel occlusion, aneurysm, or significant stenosis is identified. CTA neck: 1. Patent carotid  and vertebral arteries. No dissection, aneurysm, or significant stenosis by NASCET criteria. 2. Nodules in bilateral lobes of thyroid measuring up to 2.6 cm on the right. Thyroid ultrasound is recommended on a nonemergent basis. CT perfusion head: By automated RAPID quantification: Infarct core 82 cc, ischemic penumbra 75 cc, infarct location in left MCA distribution. These results were called by telephone at the time of interpretation on 07/27/2017 at 9:23 pm to Dr. Amie Portland , who verbally acknowledged these results. Electronically Signed: By: Kristine Garbe M.D. On: 07/27/2017 21:39   Ct Head Wo Contrast  Result Date: 07/28/2017 CLINICAL DATA:  Follow-up suspected LEFT middle cerebral artery extravasation during incomplete thrombectomy. EXAM: CT HEAD WITHOUT CONTRAST TECHNIQUE: Contiguous axial images were obtained from the base of the skull through the vertex without intravenous contrast. COMPARISON:  Multiple CT HEAD May 27, 2018 FINDINGS: BRAIN: Increased density LEFT MCA distribution extending into LEFT insula and LEFT frontoparietal sulci measuring to 91 Hounsfield units. Faint density interpeduncular cistern measuring 50 Hounsfield units. Confluent LEFT frontotemporal parietal hypodensity extending to LEFT basal ganglia. No convincing evidence of intraparenchymal hemorrhage. Regional LEFT cerebrum mass effect without midline shift. No hydrocephalus. Basal cisterns are patent. 1 cm cyst in caudal aspect splenium of corpus  callosum versus pineal cyst, less likely. VASCULAR: Dense LEFT MCA (55 Hounsfield units). New density along anterior cerebral artery distribution. SKULL/SOFT TISSUES: No skull fracture. No significant soft tissue swelling. ORBITS/SINUSES: The included ocular globes and orbital contents are normal.Mild paranasal sinusitis. Life-support lines in place. Mastoid air cells are well aerated. OTHER: None. IMPRESSION: 1. Evolving acute large LEFT MCA territory infarct without hemorrhagic conversion. 2. Postprocedural extra-axial contrast extravasation in addition to probable contrast staining and small volume subarachnoid hemorrhage. 3. New density anterior cerebral artery concerning for thromboembolism. Critical Value/emergent results were called by telephone at the time of interpretation on 07/28/2017 at 4:00 am to Dr. Amie Portland , who verbally acknowledged these results. Electronically Signed   By: Elon Alas M.D.   On: 07/28/2017 04:16   Ct Angio Neck W Or Wo Contrast  Addendum Date: 07/27/2017   ADDENDUM REPORT: 07/27/2017 21:47 CONTRAST:  Total of 200 cc Omnipaque 370 administered. Electronically Signed   By: Kristine Garbe M.D.   On: 07/27/2017 21:47   Result Date: 07/27/2017 CLINICAL DATA:  42 y/o  M; right-sided numbness, acute stroke. EXAM: CT ANGIOGRAPHY HEAD AND NECK CT PERFUSION BRAIN TECHNIQUE: Multidetector CT imaging of the head and neck was performed using the standard protocol during bolus administration of intravenous contrast. Multiplanar CT image reconstructions and MIPs were obtained to evaluate the vascular anatomy. Carotid stenosis measurements (when applicable) are obtained utilizing NASCET criteria, using the distal internal carotid diameter as the denominator. Multiphase CT imaging of the brain was performed following IV bolus contrast injection. Subsequent parametric perfusion maps were calculated using RAPID software. CONTRAST:  See addendum. COMPARISON:  07/27/2016 CT  head FINDINGS: CTA NECK FINDINGS Aortic arch: Standard branching. Imaged portion shows no evidence of aneurysm or dissection. No significant stenosis of the major arch vessel origins. Right carotid system: No evidence of dissection, stenosis (50% or greater) or occlusion. Left carotid system: No evidence of dissection, stenosis (50% or greater) or occlusion. Vertebral arteries: Right dominant. No evidence of dissection, stenosis (50% or greater) or occlusion. Skeleton: Negative. Other neck: Nodules in bilateral lobes of thyroid measuring up to 2.6 cm on the right (series 7 image 149). Upper chest: Negative. Review of the MIP images confirms the above findings CTA HEAD  FINDINGS Anterior circulation: Left proximal M1 occlusion with intermediate left MCA distribution collateralization. Otherwise no significant stenosis, proximal occlusion, aneurysm, or vascular malformation in the anterior circulation. Posterior circulation: No significant stenosis, proximal occlusion, aneurysm, or vascular malformation. Venous sinuses: As permitted by contrast timing, patent. Anatomic variants: Bilateral fetal PCA. Small anterior communicating artery. Review of the MIP images confirms the above findings CT Brain Perfusion Findings: CBF (<30%) Volume: 38m Perfusion (Tmax>6.0s) volume: 1552mMismatch Volume: 7558mnfarction Location:Left MCA distribution IMPRESSION: CTA head: 1. Left M1 occlusion with intermediate left MCA distribution collateralization. 2. Otherwise patent anterior and posterior intracranial circulation. No additional large vessel occlusion, aneurysm, or significant stenosis is identified. CTA neck: 1. Patent carotid and vertebral arteries. No dissection, aneurysm, or significant stenosis by NASCET criteria. 2. Nodules in bilateral lobes of thyroid measuring up to 2.6 cm on the right. Thyroid ultrasound is recommended on a nonemergent basis. CT perfusion head: By automated RAPID quantification: Infarct core 82 cc,  ischemic penumbra 75 cc, infarct location in left MCA distribution. These results were called by telephone at the time of interpretation on 07/27/2017 at 9:23 pm to Dr. ASHAmie Portlandwho verbally acknowledged these results. Electronically Signed: By: LanKristine GarbeD. On: 07/27/2017 21:39   Ct Cerebral Perfusion W Contrast  Addendum Date: 07/27/2017   ADDENDUM REPORT: 07/27/2017 21:47 CONTRAST:  Total of 200 cc Omnipaque 370 administered. Electronically Signed   By: LanKristine GarbeD.   On: 07/27/2017 21:47   Result Date: 07/27/2017 CLINICAL DATA:  41 3o  M; right-sided numbness, acute stroke. EXAM: CT ANGIOGRAPHY HEAD AND NECK CT PERFUSION BRAIN TECHNIQUE: Multidetector CT imaging of the head and neck was performed using the standard protocol during bolus administration of intravenous contrast. Multiplanar CT image reconstructions and MIPs were obtained to evaluate the vascular anatomy. Carotid stenosis measurements (when applicable) are obtained utilizing NASCET criteria, using the distal internal carotid diameter as the denominator. Multiphase CT imaging of the brain was performed following IV bolus contrast injection. Subsequent parametric perfusion maps were calculated using RAPID software. CONTRAST:  See addendum. COMPARISON:  07/27/2016 CT head FINDINGS: CTA NECK FINDINGS Aortic arch: Standard branching. Imaged portion shows no evidence of aneurysm or dissection. No significant stenosis of the major arch vessel origins. Right carotid system: No evidence of dissection, stenosis (50% or greater) or occlusion. Left carotid system: No evidence of dissection, stenosis (50% or greater) or occlusion. Vertebral arteries: Right dominant. No evidence of dissection, stenosis (50% or greater) or occlusion. Skeleton: Negative. Other neck: Nodules in bilateral lobes of thyroid measuring up to 2.6 cm on the right (series 7 image 149). Upper chest: Negative. Review of the MIP images confirms the  above findings CTA HEAD FINDINGS Anterior circulation: Left proximal M1 occlusion with intermediate left MCA distribution collateralization. Otherwise no significant stenosis, proximal occlusion, aneurysm, or vascular malformation in the anterior circulation. Posterior circulation: No significant stenosis, proximal occlusion, aneurysm, or vascular malformation. Venous sinuses: As permitted by contrast timing, patent. Anatomic variants: Bilateral fetal PCA. Small anterior communicating artery. Review of the MIP images confirms the above findings CT Brain Perfusion Findings: CBF (<30%) Volume: 73m72mrfusion (Tmax>6.0s) volume: 157mL69mmatch Volume: 75mL 11mrction Location:Left MCA distribution IMPRESSION: CTA head: 1. Left M1 occlusion with intermediate left MCA distribution collateralization. 2. Otherwise patent anterior and posterior intracranial circulation. No additional large vessel occlusion, aneurysm, or significant stenosis is identified. CTA neck: 1. Patent carotid and vertebral arteries. No dissection, aneurysm, or significant stenosis by NASCET criteria. 2. Nodules in  bilateral lobes of thyroid measuring up to 2.6 cm on the right. Thyroid ultrasound is recommended on a nonemergent basis. CT perfusion head: By automated RAPID quantification: Infarct core 82 cc, ischemic penumbra 75 cc, infarct location in left MCA distribution. These results were called by telephone at the time of interpretation on 07/27/2017 at 9:23 pm to Dr. Amie [place] , who verbally acknowledged these results. Electronically Signed: By: Kristine Garbe M.D. On: 07/27/2017 21:39   Dg Chest Port 1 View  Result Date: 07/28/2017 CLINICAL DATA:  42 year old male status post intubation. EXAM: PORTABLE CHEST 1 VIEW COMPARISON:  None. FINDINGS: An enteric tube is seen with side-port in the region of the gastroesophageal junction and tip in the proximal stomach. Recommend further advancing of the tube into the stomach by  approximately 3-4 cm. A partially visualized structure with tip at the level of the T1 may represent the endotracheal tube. Clinical correlation is recommended. If an endotracheal tube is present recommend further advancing the tube by approximately 3 cm for optimal positioning. The lungs are clear. There is no pleural effusion or pneumothorax. The cardiac silhouette is within normal limits. No acute osseous pathology. IMPRESSION: 1. Enteric tube with tip in the stomach and side-port in the region of the gastroesophageal junction. Recommend advancing the tube further into the stomach for optimal positioning. A partially visualized density over the T1 may represent the tip of the endotracheal tube. If an endotracheal tube is present recommend further advancing by approximately 3 cm for optimal positioning. 2. No acute cardiopulmonary process. Electronically Signed   By: Anner Crete M.D.   On: 07/28/2017 01:18   Ct Head Code Stroke Wo Contrast  Result Date: 07/27/2017 CLINICAL DATA:  Code stroke. 42 y/o M; right-sided weakness and facial droop. EXAM: CT HEAD WITHOUT CONTRAST TECHNIQUE: Contiguous axial images were obtained from the base of the skull through the vertex without intravenous contrast. COMPARISON:  None. FINDINGS: Brain: Faint lucency involving left insula and frontal operculum. No significant mass effect or hemorrhage at this time. No hydrocephalus, basilar cistern effacement, or extra-axial collection. Vascular: Dense left M1. Skull: Normal. Negative for fracture or focal lesion. Sinuses/Orbits: No acute finding. Other: None. ASPECTS [facility] Stroke Program Early CT Score) - Ganglionic level infarction (caudate, lentiform nuclei, internal capsule, insula, M1-M3 cortex): 5 - Supraganglionic infarction (M4-M6 cortex): 3 Total score (0-10 with 10 being normal): 8 IMPRESSION: 1. Left insula and frontal operculum lucency compatible with infarction. No hemorrhage or mass effect. 2. Left M1  hyperdensity, likely thrombus. 3. ASPECTS is 8 These results were called by telephone at the time of interpretation on 07/27/2017 at 9:10 pm to Dr. Rory Percy , who verbally acknowledged these results. Electronically Signed   By: Kristine Garbe M.D.   On: 07/27/2017 21:12   CT head 05/28/18 10am 1. Progressive left MCA territory infarct with further involvement of the left caudate head and lentiform nucleus. 2. Extensive nonhemorrhagic infarcts involving the left temporal tip, left frontal operculum, left parietal lobe, and medial left frontal lobe, the distal left ACA territory. 3. Contrast extravasation compatible with perforation into the left sylvian fissure, the sulci over the convexity, and the interpeduncular notch. 4. Minimal midline shift. 5. Fluid in the sinuses likely secondary to intubation.  Transthoracic Echocardiogram - Left ventricle: The cavity size was normal. Wall thickness was   increased in a pattern of mild LVH. Systolic function was normal.   The estimated ejection fraction was in the range of 60% to 65%.   Wall  motion was normal; there were no regional wall motion   abnormalities. Left ventricular diastolic function parameters   were normal. - Atrial septum: No defect or patent foramen ovale was identified.  Bilateral LE venous Dopplers - No evidence of deep vein thrombosis or baker's cysts bilaterally.   Ct Head Wo Contrast  Result Date: 07/29/2017 CLINICAL DATA:  Stroke. EXAM: CT HEAD WITHOUT CONTRAST TECHNIQUE: Contiguous axial images were obtained from the base of the skull through the vertex without intravenous contrast. COMPARISON:  MRI brain/12/19.  CT head 07/28/2017. FINDINGS: Brain: A large left MCA and ACA territory infarct is again noted. There is diffuse hypoattenuation involving affected areas of the left frontal and parietal lobe. The superior and anterior temporal lobe is involved. Contrast and hemorrhage within the sylvian fissure and along the  left MCA is again noted. No new hemorrhage is present. There is increasing mass effect with effacement of the left lateral ventricle and now 10 mm of midline shift. There is increasing prominence of the right temporal horn compatible with mass effect on the lateral ventricles. Effacement of the para mesencephalic cistern on the right suggest developing uncal herniation. Brainstem and cerebellum are unremarkable. Vascular: Hyperdense left MCA and branches compatible with contrast and thrombus. Skull: Calvarium is intact. No focal lytic or blastic lesions are present. Sinuses/Orbits: Increased fluid is present in the left sphenoid sinus. There is mild mucosal thickening of the left maxillary sinus. Globes and orbits are within normal limits. IMPRESSION: 1. Evolving left MCA and ACA territory infarct increasing mass effect and midline shift, now measuring 10 mm. 2. Mild increasing dilation of the right temporal tip compatible with left-sided effacement. 3. Developing uncal herniation without significant downward herniation of the brainstem. 4. Subarachnoid contrast and hemorrhage again noted within the sylvian fissure. Electronically Signed   By: San Morelle M.D.   On: 07/29/2017 06:40   Mr Brain Wo Contrast  Result Date: 07/28/2017 CLINICAL DATA:  Follow-up LEFT-sided stroke. Status post tPA July 27, 2017. EXAM: MRI HEAD WITHOUT CONTRAST TECHNIQUE: Multiplanar, multiecho pulse sequences of the brain and surrounding structures were obtained without intravenous contrast. COMPARISON:  CT HEAD July 28, 2017 at 0852 hours FINDINGS: BRAIN: Confluent LEFT frontotemporal parietal and LEFT basal ganglia reduced diffusion with involvement of the mesial LEFT parietal and frontal lobes. Low ADC values. Susceptibility artifact within LEFT sylvian fissure and LEFT frontal parietal sulci. No intraparenchymal hemorrhage. Worsening 8 mm LEFT-to-RIGHT midline shift, increased from 2 mm. Partially effaced LEFT lateral  ventricle with early RIGHT lateral ventricle entrapment. 11 mm T2 bright simple cyst within or adjacent to splenium of corpus callosum. No significant extra-axial fluid accumulation. VASCULAR: Normal major intracranial vascular flow voids present at skull base. SKULL AND UPPER CERVICAL SPINE: No abnormal sellar expansion. No suspicious calvarial bone marrow signal. Craniocervical junction maintained. SINUSES/ORBITS: Secretions layering in scratches at layering secretions in paranasal sinuses. Life-support lines in place. Mastoid air cells are well aerated. The included ocular globes and orbital contents are non-suspicious. OTHER: None. IMPRESSION: 1. Evolving acute large LEFT MCA territory nonhemorrhagic infarct. Evolving acute distal LEFT ACA territory nonhemorrhagic infarct. 2. Worsening 8 mm LEFT-to-RIGHT midline shift with early RIGHT ventricle entrapment. 3. Susceptibility artifact LEFT sylvian fissure and LEFT sulci corresponding to known extra-axial hemorrhage. Acute findings text paged to [place] via AMION secure system on 07/28/2017 at 10:37 pm. Electronically Signed   By: Elon Alas M.D.   On: 07/28/2017 22:38   Ct Head Wo Contrast  Result Date: 07/31/2017 CLINICAL  DATA:  Stroke, follow-up craniotomy. EXAM: CT HEAD WITHOUT CONTRAST TECHNIQUE: Contiguous axial images were obtained from the base of the skull through the vertex without intravenous contrast. COMPARISON:  CT HEAD July 29, 2017 and MRI of the head July 28, 2017 FINDINGS: BRAIN: Mild external herniation LEFT cerebrum via craniectomy defect. Evolving large LEFT acute to subacute MCA territory infarct without hemorrhagic conversion. Moderate LEFT frontoparietal ACA territory infarct. Regional mass effect without midline shift. Persistent partial LEFT lateral ventricle effacement. Resolving RIGHT ventricular entrapment. Mild residual LEFT uncal herniation. Patent basal cisterns. VASCULAR: Dense LEFT MCA consistent with  thromboembolism and, potentially trapped extravasated contrast. Focally dense LEFT ACA. SKULL/SOFT TISSUES: Interval LEFT decompressive craniectomy. LEFT scalp soft tissue swelling, subcutaneous gas with overlying skin staples. ORBITS/SINUSES: The included ocular globes and orbital contents are normal.Mild paranasal sinus mucosal thickening. LEFT sphenoid sinus air-fluid level. Mastoid air cells are well aerated. OTHER: Life-support lines in place. IMPRESSION: 1. Evolving large LEFT MCA and moderate LEFT ACA territory infarct without hemorrhagic conversion. 2. Interval decompressive LEFT craniectomy with mild external herniation of LEFT cerebrum. Resolution of midline shift. 3. Resolving RIGHT ventricular entrapment. Electronically Signed   By: Elon Alas M.D.   On: 07/31/2017 04:26    PHYSICAL EXAM  Temp:  [98.8 F (37.1 C)-101.4 F (38.6 C)] 98.8 F (37.1 C) (01/16 1200) Pulse Rate:  [75-109] 80 (01/16 1200) Resp:  [16-28] 18 (01/16 1200) BP: (136-163)/(89-112) 138/102 (01/16 1200) SpO2:  [92 %-100 %] 100 % (01/16 1200) FiO2 (%):  [30 %-40 %] 30 % (01/16 1147) Weight:  [193 lb 9 oz (87.8 kg)] 193 lb 9 oz (87.8 kg) (01/16 0500)  General - Well nourished, well developed, intubated.  Ophthalmologic - Fundi not visualized due to eye movement.  Cardiovascular - Regular rate and rhythm.  Neuro - intubated, on low dose sedation, lethargic, but open right eye to voice, left eyelid swelling,  PERRL, left gaze preference, not able to cross midline, blinking to visual threat on the left, but not able to the left.  Able to follow all simple commands.  Facial symmetry not able to test due to ET tube.  Left upper extremity and lower extremity at least 4/5, right upper and lower extremity dense hemiplegia.  DTR 1+, no Babinski.  Sensation and coordination not cooperative.  Gait not tested.   ASSESSMENT/PLAN Mr. Anzel Kearse is a 42 y.o. male with history of HTN, OSA presenting with right-sided  weakness, left gaze and right neglect. He receive IV t-PA.  However, unsuccessful for mechanical thrombectomy with complication of left SAH.  Stroke: Left large MCA infarct status post TPA with unsuccessful IR attempt causing left SAH.  Resultant  left gaze, right hemiplegia, right neglect  CT head left MCA  hyperdense  CT head and neck left MCA occlusion  DSA with unsuccessful IR attempt causing left SAH  CT repeat showed left MCA large infarct with left stable SAH and stable subtle midline shift  MRI head large left MCA and ACA infarct with midline shift  Repeat CT 07/31/17 - much improved midline shift after hemicrani  2D Echo - EF 60-65%  LE venous Doppler no DVT  Consider TEE later when stable  LDL NTC due to high TG  HgbA1c - 5.2  UDS negative  VTE prophylaxis -SCDs Diet NPO time specified  No antithrombotic prior to admission, now on ASA 334m.   Ongoing aggressive stroke risk factor management  Therapy recommendations:  pending  Disposition:  Pending  Cerebral edema s/p  decompressive hemicrani  CT head showed large left MCA infarct  MRI large left MCA and ACA infarcts with midline shift  CT repeat - increasing midline shift  CT repeat 07/31/17 - resolved midline shift  Repeat CT in am  on 3% saline  Na 144->150->153->152  Sodium goal 150 - 155  23.4% saline PRN  Neurosurgery on board - s/p hemi craniectomy   Fever - likely related to surgery  Tmax 101.4  On temperature control protocol  Off arctic sun  WBC 11.2->10.6->11.2  Hypertension  Stable but elevated than before  Likely related to increased ICP  BP goal < 160 due to [facility]  Hyperlipidemia  Home meds: None  LDL NTC due to high TG, goal < 70  TG 417  On zetia and lipitor  Continue on discharge  B12 deficiency  B12 161   B12 im supplement  Other Stroke Risk Factors  Obstructive sleep apnea  Other Active Problems  Intubated  TSH mildly low but FT4  normal  Elevated ESR  Hospital day # 5  This patient is critically ill due to large left MCA infarct, SAH, cerebral edema and at significant risk of neurological worsening, death form brain herniation, seizure, heart failure. This patient's care requires constant monitoring of vital signs, hemodynamics, respiratory and cardiac monitoring, review of multiple databases, neurological assessment, discussion with family, other specialists and medical decision making of high complexity. I had long discussion with mom at bedside, updated pt current condition, treatment plan and potential prognosis. She expressed understanding and appreciation. I also discussed with Dr. Kathyrn Sheriff and Dr. Carson Myrtle in the hallway. I spent 35 minutes of neurocritical care time in the care of this patient.  Rosalin Hawking, MD PhD Stroke Neurology 08/01/2017 1:05 PM   To contact Stroke Continuity provider, please refer to [email or web address] After hours, contact General Neurology

## 2017-08-01 NOTE — Progress Notes (Signed)
PULMONARY / CRITICAL CARE MEDICINE   Name: Jeremy [name] MRN: [id number] DOB: March 07, 1976    ADMISSION DATE:  07/27/2017 CONSULTATION DATE:  07/27/2017   REFERRING MD:  Dr. Wilford CornerArora  CHIEF COMPLAINT:  Vent management post stroke  HISTORY OF PRESENT ILLNESS:   42 y/o male with OSA, HTN was admitted on 1/11 with abrupt onset R facial droop.  He was found to have evidence of a left MCA stroke. He was given IV TP and went unsuccessful IR attempt for thrombectomy.   Of note, patient reported headache, fever 103, and flu like symptoms for 2-3 days prior to presentation on 1/11.  WBC normal on admit, low grade temp.  SUBJECTIVE:  Awake, appears comfortable, still spiking fever  VITAL SIGNS: BP (!) 156/112   Pulse 89   Temp (!) 101.4 F (38.6 C) (Rectal)   Resp 18   Ht 5\' 10"  (1.778 m)   Wt 193 lb 9 oz (87.8 kg)   SpO2 100%   BMI 27.77 kg/m   HEMODYNAMICS:    VENTILATOR SETTINGS: Vent Mode: PSV;CPAP FiO2 (%):  [40 %] 40 % Set Rate:  [14 bmp] 14 bmp Vt Set:  [580 mL] 580 mL PEEP:  [5 cmH20] 5 cmH20 Pressure Support:  [5 cmH20] 5 cmH20 Plateau Pressure:  [16 cmH20-17 cmH20] 17 cmH20  INTAKE / OUTPUT:  Intake/Output Summary (Last 24 hours) at 08/01/2017 0849 Last data filed at 08/01/2017 0800 Gross per 24 hour  Intake 2551.35 ml  Output 3430 ml  Net -878.65 ml     PHYSICAL EXAMINATION: General: This is a 42 year old male patient resting comfortably on pressure support of 5.  He is awake, appears appropriate, follows commands, he is hemiplegic on the right. HEENT: Post craniectomy site unremarkable, orally intubated no jugular venous distention Pulmonary: Clear to auscultation excellent tidal volume on pressure support ventilation no accessory use Cardiac: Regular rate and rhythm without murmur rub or gallop Abdomen: Soft nontender no organomegaly positive bowel sounds Extremities/musculoskeletal: As mentioned above plegic on the right, good strength and bulk on the left.  Trace  dependent edema. Neuro: Awake, follows commands, sticks tongue out, strength on left good very weak on right. LABS:  BMET Recent Labs  Lab 07/30/17 0440  07/31/17 0317  07/31/17 1604 07/31/17 2253 08/01/17 0433  NA 150*   < > 153*   < > 153* 152* 152*  K 3.6  --  3.1*  --   --   --  3.4*  CL 119*  --  121*  --   --   --  119*  CO2 25  --  24  --   --   --  25  BUN 13  --  16  --   --   --  17  CREATININE 0.91  --  0.88  --   --   --  0.78  GLUCOSE 149*  --  116*  --   --   --  157*   < > = values in this interval not displayed.    Electrolytes Recent Labs  Lab 07/28/17 0259  07/30/17 0440 07/31/17 0317 08/01/17 0433  CALCIUM 8.2*   < > 8.1* 8.1* 8.2*  MG 2.2  --   --   --  2.3  PHOS 3.2  --   --   --  1.8*   < > = values in this interval not displayed.    CBC Recent Labs  Lab 07/30/17 0440 07/31/17 0317 08/01/17 0433  WBC  11.2* 10.6* 11.2*  HGB 10.5* 10.5* 9.8*  HCT 31.2* 33.0* 30.2*  PLT 179 193 235    Coag's Recent Labs  Lab 07/27/17 2055  APTT 25  INR 0.98    Sepsis Markers No results for input(s): LATICACIDVEN, PROCALCITON, O2SATVEN in the last 168 hours.  ABG Recent Labs  Lab 07/28/17 0137  PHART 7.341*  PCO2ART 43.2  PO2ART 170.0*    Liver Enzymes Recent Labs  Lab 07/27/17 2055  AST 48*  ALT 98*  ALKPHOS 40  BILITOT 1.1  ALBUMIN 4.2    Cardiac Enzymes No results for input(s): TROPONINI, PROBNP in the last 168 hours.  Glucose Recent Labs  Lab 07/31/17 1222 07/31/17 1536 07/31/17 2048 07/31/17 2357 08/01/17 0408 08/01/17 0824  GLUCAP 119* 157* 103* 141* 114* 128*    Imaging No results found.   STUDIES:  1/11 CT head L MCA sign 1/11 CT angio neck/head> L M1 MCA occlusion 1/12 CT head :  CT head : left MCA distribution infarct, some extravasation of contrast in sylvian fissure, small volume subarachnoid hemorrhage, fluid in sinuses  1/12 CT head >>  Progressive L MCA territory infarct with further involvement of  left caudate head and lentiform nucleus; extensive nonhemorrhagic infarcts of left temporal tip, left frontal operculum, left parietal lobe, medial left front lobe distal left ACA territory; contrast extravasation compatible w/ perforation into left sylvian fissure, sulci, and interpeduncular notch; minimal midline shift  1/12 MRI Brain >> 1. Evolving acute large LEFT MCA territory nonhemorrhagic infarct. Evolving acute distal LEFT ACA territory nonhemorrhagic infarct. 2. Worsening 8 mm LEFT-to-RIGHT midline shift with early RIGHT ventricle entrapment. 3. Susceptibility artifact LEFT sylvian fissure and LEFT sulci corresponding to known extra-axial hemorrhage.  1/13 CT head >> 1. Evolving left MCA and ACA territory infarct increasing mass effect and midline shift, now measuring 10 mm. 2. Mild increasing dilation of the right temporal tip compatible with left-sided effacement. 3. Developing uncal herniation without significant downward herniation of the brainstem. 4. Subarachnoid contrast and hemorrhage again noted within the sylvian fissure.  TTE 1/13 >> EF 60-65%, mild LVH  1/15 CT head >>  1. Evolving large LEFT MCA and moderate LEFT ACA territory infarct without hemorrhagic conversion. 2. Interval decompressive LEFT craniectomy with mild external herniation of LEFT cerebrum. Resolution of midline shift. 3. Resolving RIGHT ventricular entrapment.  CULTURES: MRSA PCR 1/12 >>  negatives [facility] 1/14 >>  ANTIBIOTICS: Cefazolin 1/11 >> 1/13  SIGNIFICANT EVENTS: 1/11 admission for R MCA stroke: IV tPA and failed IR Thrombectomy 1/12 3% Na started 1/13 Left decompressive hemicraniectomy  LINES/TUBES: 1/11 ETT >  1/11 OGT > 1/11 Foley >> 1/15 1/11 R [place] CVC >>   DISCUSSION: 42 yo male with subjective fever 103 and headache at home 2-3 days prior to presenting on 1/11 with right sided weakness found to have an acute L MCA stroke, had IR thrombectomy post TPA. Some small  subarachnoid bleeding and extravasation post thromobectomy.  CT imaging from 1/12 shows evolving signs of MCA stroke. Taken for decompressive hemicrani 1/13.  Weaning well, following commands on left .  Mechanically he appears ready for extubation, however the concern is his neuro status.  His last CT was improved some however extubation would not be without risk given he still at risk for worsening cerebral edema.  Spoke with stroke team, the plan for now is to repeat CT brain imaging in a.m. on 1/17.  If CT imaging still remains stable as long as pulmonary status remains the  same will likely give him a trial of extubation on 1/17  ASSESSMENT / PLAN:  PULMONARY A:  Acute respiratory failure with hypoxemia in the setting of CVA OSA P:  Continue pressure support ventilation as tolerated Continue VAP per protocol We will repeat chest x-ray in a.m. Assess daily for readiness for extubation.  CARDIOVASCULAR A:  Acute MCA stroke Hx HTN - TTE 1/13 without acute findings or defects in atrial septum, no PFO P:  Map goal greater than 65 systolic blood pressure goal less than 180 consider TEE Continue Lipitor and Zetia  RENAL A:  Therapeutic hyper natremia Hypokalemia Progressive hyperchloremia P:    GASTROINTESTINAL A:  No acute issues P:  Continue tube feeds Bowel regimen Pantoprazole for stress dose prophylaxis  HEMATOLOGIC A:  Mild leukocytosis  Mild anemia- stable Jehovah witness P:  Minimize blood draws SCDs for for DVT prophylaxis  INFECTIOUS A:  Subjective fever 103 x 2-3 w/headache prior to presenting with acute L MCA CVA (admitting diagnosis) -Continues to spike intermittent fever -White blood cell stable -Culture data negative to date, including respiratory viral panel -He does have elevated rheumatoid factor, I am unsure of the significance of this P:  Trend fever and white blood cell curve Follow-up culture data Check pro calcitonin Holding antibiotics  for now Follow up on further autoimmune panel  ENDOCRINE A: Mild hyperglycemia HgbA1c - 5.2 P:  Continue capillary glucose checks Sliding scale insulin  NEUROLOGIC A:  Acute L MCA stroke s/p TPA and failed IR attempt causing Left SAH Cerebreal Edema s/p L decompressive hemicraniectomy w/ bone flap in abd SQ pocket - ? Neuro infection vs inflammation with subjective fever/ ha prior to admit/ CVA; ddx temporal arteritis or vasculitis  - bilateral LE venous dopplers neg 1/12 - TTE showed no defect or PFO -Most recent CT showed decreased P:  RASS goal -1 PRN fentanyl Frequent neuro checks Hypertonic saline protocol for cerebral edema  Continue right foot boot Avoid fever Additional interventions per neurology     FAMILY  - Updates:  No family at bedside. Mother at bedside yesterday.  Of note, wife is apparently in [redacted] weeks pregnant.    - Inter-disciplinary family meet or Palliative Care meeting due by:  1/19 My cct 42   Simonne Martinet ACNP-BC [facility] Pulmonary/Critical Care Pager # [phone] OR # [phone] if no answer

## 2017-08-01 NOTE — Plan of Care (Signed)
Pt tolerates q2hr turns, attempts to assist with movements in bed. Pt has moderate amount of thick, yellow secretions from ETT. Able to wean on ventilator during day shift without difficulties. Pt calm and cooperative, follows commands.

## 2017-08-01 NOTE — Progress Notes (Signed)
Sodium 157 - paged Dr. Roda ShuttersXu. Awaiting call back.

## 2017-08-02 ENCOUNTER — Inpatient Hospital Stay (HOSPITAL_COMMUNITY): Payer: BLUE CROSS/BLUE SHIELD

## 2017-08-02 LAB — CBC
HCT: 30.4 % — ABNORMAL LOW (ref 39.0–52.0)
Hemoglobin: 9.8 g/dL — ABNORMAL LOW (ref 13.0–17.0)
MCH: 29.9 pg (ref 26.0–34.0)
MCHC: 32.2 g/dL (ref 30.0–36.0)
MCV: 92.7 fL (ref 78.0–100.0)
PLATELETS: 292 10*3/uL (ref 150–400)
RBC: 3.28 MIL/uL — AB (ref 4.22–5.81)
RDW: 14.1 % (ref 11.5–15.5)
WBC: 9.9 10*3/uL (ref 4.0–10.5)

## 2017-08-02 LAB — GLUCOSE, CAPILLARY
GLUCOSE-CAPILLARY: 129 mg/dL — AB (ref 65–99)
GLUCOSE-CAPILLARY: 123 mg/dL — AB (ref 65–99)
GLUCOSE-CAPILLARY: 84 mg/dL (ref 65–99)
Glucose-Capillary: 147 mg/dL — ABNORMAL HIGH (ref 65–99)
Glucose-Capillary: 111 mg/dL — ABNORMAL HIGH (ref 65–99)

## 2017-08-02 LAB — BASIC METABOLIC PANEL
Anion gap: 9 (ref 5–15)
BUN: 21 mg/dL — AB (ref 6–20)
CO2: 26 mmol/L (ref 22–32)
CREATININE: 0.84 mg/dL (ref 0.61–1.24)
Calcium: 8.1 mg/dL — ABNORMAL LOW (ref 8.9–10.3)
Chloride: 118 mmol/L — ABNORMAL HIGH (ref 101–111)
Glucose, Bld: 131 mg/dL — ABNORMAL HIGH (ref 65–99)
POTASSIUM: 3.5 mmol/L (ref 3.5–5.1)
SODIUM: 153 mmol/L — AB (ref 135–145)

## 2017-08-02 LAB — BLOOD GAS, ARTERIAL
Acid-Base Excess: 4.2 mmol/L — ABNORMAL HIGH (ref 0.0–2.0)
BICARBONATE: 28 mmol/L (ref 20.0–28.0)
Drawn by: 51133
FIO2: 40
O2 SAT: 99.1 %
PEEP: 5 cmH2O
Patient temperature: 98.9
RATE: 14 resp/min
VT: 580 mL
pCO2 arterial: 40.5 mmHg (ref 32.0–48.0)
pH, Arterial: 7.455 — ABNORMAL HIGH (ref 7.350–7.450)
pO2, Arterial: 141 mmHg — ABNORMAL HIGH (ref 83.0–108.0)

## 2017-08-02 LAB — ANA W/REFLEX IF POSITIVE: ANA: NEGATIVE

## 2017-08-02 LAB — SODIUM
Sodium: 151 mmol/L — ABNORMAL HIGH (ref 135–145)
Sodium: 153 mmol/L — ABNORMAL HIGH (ref 135–145)
Sodium: 152 mmol/L — ABNORMAL HIGH (ref 135–145)

## 2017-08-02 MED ORDER — WHITE PETROLATUM EX OINT
TOPICAL_OINTMENT | CUTANEOUS | Status: AC
  Administered 2017-08-02: 13:00:00
  Filled 2017-08-02: qty 28.35

## 2017-08-02 MED ORDER — ASPIRIN 325 MG PO TABS
325.0000 mg | ORAL_TABLET | Freq: Every day | ORAL | Status: DC
  Administered 2017-08-02 – 2017-08-07 (×5): 325 mg
  Filled 2017-08-02 (×5): qty 1

## 2017-08-02 NOTE — Progress Notes (Signed)
PULMONARY / CRITICAL CARE MEDICINE   Name: Jeremy [name] MRN: [id number] DOB: 1976/04/18    ADMISSION DATE:  07/27/2017 CONSULTATION DATE:  07/27/2017   REFERRING MD:  Dr. Wilford Corner  CHIEF COMPLAINT:  Vent management post stroke  HISTORY OF PRESENT ILLNESS:   42 y/o male with OSA, HTN was admitted on 1/11 with abrupt onset R facial droop.  He was found to have evidence of a left MCA stroke. He was given IV TP and went unsuccessful IR attempt for thrombectomy.   Of note, patient reported headache, fever 103, and flu like symptoms for 2-3 days prior to presentation on 1/11.  WBC normal on admit, low grade temp.  SUBJECTIVE:  Awake and alert.  Follows commands.  Appears ready for attempted extubation.  VITAL SIGNS: BP (!) 154/105   Pulse 88   Temp 98.4 F (36.9 C) (Oral)   Resp 19   Ht 5\' 10"  (1.778 m)   Wt 85.7 kg (188 lb 15 oz)   SpO2 100%   BMI 27.11 kg/m   HEMODYNAMICS:    VENTILATOR SETTINGS: Vent Mode: PRVC FiO2 (%):  [30 %-40 %] 40 % Set Rate:  [14 bmp] 14 bmp Vt Set:  [580 mL] 580 mL PEEP:  [5 cmH20] 5 cmH20 Pressure Support:  [5 cmH20-8 cmH20] 8 cmH20 Plateau Pressure:  [10 cmH20-17 cmH20] 13 cmH20  INTAKE / OUTPUT:  Intake/Output Summary (Last 24 hours) at 08/02/2017 7829 Last data filed at 08/02/2017 0700 Gross per 24 hour  Intake 1967.5 ml  Output 1826 ml  Net 141.5 ml     PHYSICAL EXAMINATION: General: Well-nourished well-developed 42 year old male HEENT: Endotracheal tube connected to ventilator, currently on full mechanical ventilatory support PSY: Difficult to evaluate Neuro: Follows commands nods head dense right hemiparesis CV: Heart sounds are regular in rate and rhythm PULM: Decreased breath sounds in the bases GI: Slight distention, positive bowel sounds, nontender to palpitation Extremities: Warm and dry, right space boot in place, edema Skin: no rashes or lesions . LABS:  BMET Recent Labs  Lab 07/31/17 0317  08/01/17 0433  08/01/17 1709  08/01/17 2314 08/02/17 0523  NA 153*   < > 152*   < > 153* 154* 153*  K 3.1*  --  3.4*  --   --   --  3.5  CL 121*  --  119*  --   --   --  118*  CO2 24  --  25  --   --   --  26  BUN 16  --  17  --   --   --  21*  CREATININE 0.88  --  0.78  --   --   --  0.84  GLUCOSE 116*  --  157*  --   --   --  131*   < > = values in this interval not displayed.    Electrolytes Recent Labs  Lab 07/28/17 0259  07/31/17 0317 08/01/17 0433 08/02/17 0523  CALCIUM 8.2*   < > 8.1* 8.2* 8.1*  MG 2.2  --   --  2.3  --   PHOS 3.2  --   --  1.8*  --    < > = values in this interval not displayed.    CBC Recent Labs  Lab 07/31/17 0317 08/01/17 0433 08/02/17 0523  WBC 10.6* 11.2* 9.9  HGB 10.5* 9.8* 9.8*  HCT 33.0* 30.2* 30.4*  PLT 193 235 292    Coag's Recent Labs  Lab 07/27/17 2055  APTT 25  INR 0.98    Sepsis Markers No results for input(s): LATICACIDVEN, PROCALCITON, O2SATVEN in the last 168 hours.  ABG Recent Labs  Lab 07/28/17 0137 08/02/17 0405  PHART 7.341* 7.455*  PCO2ART 43.2 40.5  PO2ART 170.0* 141*    Liver Enzymes Recent Labs  Lab 07/27/17 2055  AST 48*  ALT 98*  ALKPHOS 40  BILITOT 1.1  ALBUMIN 4.2    Cardiac Enzymes No results for input(s): TROPONINI, PROBNP in the last 168 hours.  Glucose Recent Labs  Lab 08/01/17 1157 08/01/17 1552 08/01/17 2003 08/01/17 2354 08/02/17 0345 08/02/17 0803  GLUCAP 122* 122* 139* 133* 129* 147*    Imaging Ct Head Wo Contrast  Result Date: 08/02/2017 CLINICAL DATA:  42 y/o  M; left MCA and ACA infarcts for follow-up. EXAM: CT HEAD WITHOUT CONTRAST TECHNIQUE: Contiguous axial images were obtained from the base of the skull through the vertex without intravenous contrast. COMPARISON:  07/31/2016 CT head FINDINGS: Brain: Stable distribution of left MCA and ACA infarctions. Stable edema with herniation of the left cerebral convexity with large craniectomy defect, effacement of left lateral ventricle, left uncal  herniation, and 4 mm left-to-right midline shift. No downward herniation identified. Stable right ventricle size. No new stroke. No interval hemorrhage. Vascular: Hyperdensity within the left M1 and distal vessels likely representing thrombus. Skull: Postsurgical changes related to left hemi craniectomy stable edema in the overlying scalp and skin staples. Sinuses/Orbits: Sphenoid sinus opacification with fluid levels, partial opacification of left mastoid air cells, and left maxillary sinus mucosal thickening, probably due to intubation. Orbits are unremarkable. Other: None. IMPRESSION: 1. Stable distribution of left MCA and ACA infarctions. No new infarction or hemorrhage. 2. Stable mass effect with 4 mm left-to-right midline shift and mild herniation via craniectomy. Electronically Signed   By: Mitzi HansenLance  Furusawa-Stratton M.D.   On: 08/02/2017 05:06   Dg Chest Port 1 View  Result Date: 08/01/2017 CLINICAL DATA:  42 year old male status post cerebral emergent large vessel occlusion, large left MCA infarct. Intubated. EXAM: PORTABLE CHEST 1 VIEW COMPARISON:  07/31/2017 and earlier. FINDINGS: Portable AP semi upright view at 0947 hrs. Stable endotracheal tube tip at the level the clavicles. Stable enteric tube, side hole projects at the proximal stomach. Stable right subclavian central line. Stable lung volumes. Allowing for portable technique the lungs are clear. Normal cardiac size and mediastinal contours. Negative visible bowel gas pattern. IMPRESSION: 1.  Stable lines and tubes. 2.  No acute cardiopulmonary abnormality. Electronically Signed   By: Odessa FlemingH  Hall M.D.   On: 08/01/2017 10:10     STUDIES:  1/11 CT head L MCA sign 1/11 CT angio neck/head> L M1 MCA occlusion 1/12 CT head :  CT head : left MCA distribution infarct, some extravasation of contrast in sylvian fissure, small volume subarachnoid hemorrhage, fluid in sinuses  1/12 CT head >>  Progressive L MCA territory infarct with further involvement  of left caudate head and lentiform nucleus; extensive nonhemorrhagic infarcts of left temporal tip, left frontal operculum, left parietal lobe, medial left front lobe distal left ACA territory; contrast extravasation compatible w/ perforation into left sylvian fissure, sulci, and interpeduncular notch; minimal midline shift  1/12 MRI Brain >> 1. Evolving acute large LEFT MCA territory nonhemorrhagic infarct. Evolving acute distal LEFT ACA territory nonhemorrhagic infarct. 2. Worsening 8 mm LEFT-to-RIGHT midline shift with early RIGHT ventricle entrapment. 3. Susceptibility artifact LEFT sylvian fissure and LEFT sulci corresponding to known extra-axial hemorrhage.  1/13 CT head >> 1. Evolving  left MCA and ACA territory infarct increasing mass effect and midline shift, now measuring 10 mm. 2. Mild increasing dilation of the right temporal tip compatible with left-sided effacement. 3. Developing uncal herniation without significant downward herniation of the brainstem. 4. Subarachnoid contrast and hemorrhage again noted within the sylvian fissure.  TTE 1/13 >> EF 60-65%, mild LVH  1/15 CT head >>  1. Evolving large LEFT MCA and moderate LEFT ACA territory infarct without hemorrhagic conversion. 2. Interval decompressive LEFT craniectomy with mild external herniation of LEFT cerebrum. Resolution of midline shift. 3. Resolving RIGHT ventricular entrapment.  08/02/2017 CT of the head no significant changes  CULTURES: MRSA PCR 1/12 >>  negatives [facility] 1/14 >>  ANTIBIOTICS: Cefazolin 1/11 >> 1/13  SIGNIFICANT EVENTS: 1/11 admission for R MCA stroke: IV tPA and failed IR Thrombectomy 1/12 3% Na started 1/13 Left decompressive hemicraniectomy  LINES/TUBES: 1/11 ETT > 06/02/2018 1/11 OGT > 06/02/2018 1/11 Foley >> 1/15 1/11 R [place] CVC >>   DISCUSSION: 42 yo male with subjective fever 103 and headache at home 2-3 days prior to presenting on 1/11 with right sided weakness found to  have an acute L MCA stroke, had IR thrombectomy post TPA. Some small subarachnoid bleeding and extravasation post thromobectomy.  CT imaging from 1/12 shows evolving signs of MCA stroke. Taken for decompressive hemicrani 1/13.  Weaning well, following commands on left .  Mechanically he appears ready for extubation, however the concern is his neuro status.  His last CT was improved some however extubation would not be without risk given he still at risk for worsening cerebral edema.  Repeat head CT 08/02/2017 no acute changes. 08/02/2017 appears ready for attempt at extubation.  ASSESSMENT / PLAN:  PULMONARY A:  Acute respiratory failure with hypoxemia in the setting of CVA OSA P:  Continue pressure support ventilation as tolerated Continue VAP per protocol We will repeat chest x-ray in a.m. Assess daily for readiness for extubation. 08/02/2016 appears ready for extubation will confirm with neurology.  Order placed for extubation 10 AM 06/02/2018  CARDIOVASCULAR A:  Acute MCA stroke Hx HTN - TTE 1/13 without acute findings or defects in atrial septum, no PFO -Hemodynamically stable 08/02/2017 P:  Map goal greater than 65 systolic blood pressure goal less than 180 consider TEE Continue Lipitor and Zetia  RENAL Recent Labs  Lab 08/01/17 1709 08/01/17 2314 08/02/17 0523  NA 153* 154* 153*   Recent Labs  Lab 07/31/17 0317 08/01/17 0433 08/02/17 0523  K 3.1* 3.4* 3.5    A:  Therapeutic hyper natremia Hypokalemia Progressive hyperchloremia P:  Currently on hypertonic saline Replete electrolytes as needed  GASTROINTESTINAL A:  No acute issues P:  Continue tube feeds, 08/02/2017 consider holding for questionable extubation. Bowel regimen Pantoprazole for stress dose prophylaxis  HEMATOLOGIC Recent Labs    08/01/17 0433 08/02/17 0523  HGB 9.8* 9.8*    A:  Mild leukocytosis  Mild anemia- stable Jehovah witness P:  Minimize blood draws SCDs for for DVT  prophylaxis  INFECTIOUS A:  Subjective fever 103 x 2-3 w/headache prior to presenting with acute L MCA CVA (admitting diagnosis) T-max 08/02/2017 100.4 -Continues to spike intermittent fever -White blood cell stable -Culture data negative to date 08/02/2017, including respiratory viral panel -He does have elevated rheumatoid factor, I am unsure of the significance of this P:  Trend fever and white blood cell curve Follow-up culture data Check pro calcitonin Holding antibiotics for now Follow up on further autoimmune panel  ENDOCRINE CBG (  last 3)  Recent Labs    08/01/17 2354 08/02/17 0345 08/02/17 0803  GLUCAP 133* 129* 147*    A: Mild hyperglycemia HgbA1c - 5.2 P:  Continue capillary glucose checks Sliding scale insulin  NEUROLOGIC A:  Acute L MCA stroke s/p TPA and failed IR attempt causing Left SAH Cerebreal Edema s/p L decompressive hemicraniectomy w/ bone flap in abd SQ pocket - ? Neuro infection vs inflammation with subjective fever/ ha prior to admit/ CVA; ddx temporal arteritis or vasculitis  - bilateral LE venous dopplers neg 1/12 - TTE showed no defect or PFO -Most recent CT showed decreased edema P:  RASS goal -1 PRN fentanyl Frequent neuro checks Hypertonic saline protocol for cerebral edema 30 cc an hour Continue right foot boot Avoid fever Additional interventions per neurology     FAMILY  - Updates: 08/02/2017 no family at bedside.  - Inter-disciplinary family meet or Palliative Care meeting due by:  1/19 My cct 30  Steve Arleta Ostrum ACNP [facility] Pager [phone] till 1 pm If no answer page [phone] 08/02/2017, 8:15 AM

## 2017-08-02 NOTE — Progress Notes (Signed)
PT Cancellation Note  Patient Details Name: Jeremy GashJao [name] MRN: [id number] DOB: Jan 23, 1976   Cancelled Treatment:    Reason Eval/Treat Not Completed: Medical issues which prohibited therapy(order received, pt extubated at 10 and not yet 4 hrs post -extubation. Will plan to eval next date)   [name] [name] 08/02/2017, 12:33 PM  [name], PT [phone]

## 2017-08-02 NOTE — Plan of Care (Signed)
Pt progressing.  Extubated today.  A and O x4.  F/C on left.  VSS.  PT OT SPT ordered.  Mother attentive.  Pt cooperative.

## 2017-08-02 NOTE — Progress Notes (Signed)
STROKE TEAM PROGRESS NOTE   SUBJECTIVE (INTERVAL HISTORY) No family is at the bedside.  Pt still intubated, but more awake alert and following all simple commands. Fever better controlled. Repeat CT in am showed stable midline shift at 58m and stable infarct. Will extubate today.   Past Medical History:  Diagnosis Date  . Anxiety   . Asthma    MILD AS A CHILD  . Depression   . Deviated septum   . Dyspnea    NORMAL SPIROMETRY 02/01/17 VISIT WITH DR HPenrose . Hypertension   . Sleep apnea    FAMILY HISTORY History reviewed. No pertinent family history.  SOCIAL HISTORY  reports that  has never smoked. he has never used smokeless tobacco. He reports that he drinks alcohol. He reports that he does not use drugs.   HOME MEDICATIONS:  Current Meds  Medication Sig  . spironolactone (ALDACTONE) 100 MG tablet Take 100 mg by mouth daily.      HOSPITAL MEDICATIONS:  . aspirin  325 mg Per Tube Daily  . atorvastatin  40 mg Oral q1800  . chlorhexidine gluconate (MEDLINE KIT)  15 mL Mouth Rinse BID  . Chlorhexidine Gluconate Cloth  6 each Topical Daily  . cyanocobalamin  1,000 mcg Intramuscular Daily  . enoxaparin (LOVENOX) injection  40 mg Subcutaneous Q24H  . ezetimibe  10 mg Oral Daily  . feeding supplement (PRO-STAT SUGAR FREE 64)  30 mL Per Tube Daily  . insulin aspart  1-3 Units Subcutaneous Q4H  . mouth rinse  15 mL Mouth Rinse 10 times per day  . pantoprazole sodium  40 mg Per Tube QHS  . sodium chloride flush  10-40 mL Intracatheter Q12H    OBJECTIVE Temp:  [98.4 F (36.9 C)-100.5 F (38.1 C)] 99 F (37.2 C) (01/17 0800) Pulse Rate:  [78-115] 93 (01/17 0901) Cardiac Rhythm: Sinus tachycardia (01/17 0900) Resp:  [17-23] 20 (01/17 0901) BP: (138-163)/(93-112) 151/98 (01/17 0901) SpO2:  [98 %-100 %] 100 % (01/17 0901) FiO2 (%):  [30 %-40 %] 30 % (01/17 0901) Weight:  [188 lb 15 oz (85.7 kg)] 188 lb 15 oz (85.7 kg) (01/17 0500)  CBC:  Recent Labs  Lab 07/27/17 2055   07/28/17 0259  08/01/17 0433 08/02/17 0523  WBC 6.6  --  8.0   < > 11.2* 9.9  NEUTROABS 3.2  --  6.3  --   --   --   HGB 16.1   < > 13.2   < > 9.8* 9.8*  HCT 45.5   < > 37.9*   < > 30.2* 30.4*  MCV 88.0  --  87.5   < > 92.4 92.7  PLT 228  --  203   < > 235 292   < > = values in this interval not displayed.    Basic Metabolic Panel:  Recent Labs  Lab 07/28/17 0259  08/01/17 0433  08/01/17 2314 08/02/17 0523  NA 136   < > 152*   < > 154* 153*  K 3.9   < > 3.4*  --   --  3.5  CL 105   < > 119*  --   --  118*  CO2 24   < > 25  --   --  26  GLUCOSE 123*   < > 157*  --   --  131*  BUN 18   < > 17  --   --  21*  CREATININE 0.95   < > 0.78  --   --  0.84  CALCIUM 8.2*   < > 8.2*  --   --  8.1*  MG 2.2  --  2.3  --   --   --   PHOS 3.2  --  1.8*  --   --   --    < > = values in this interval not displayed.    Lipid Panel:     Component Value Date/Time   CHOL 218 (H) 07/28/2017 0259   TRIG 316 (H) 07/30/2017 2323   HDL 28 (L) 07/28/2017 0259   CHOLHDL 7.8 07/28/2017 0259   VLDL UNABLE TO CALCULATE IF TRIGLYCERIDE OVER 400 mg/dL 07/28/2017 0259   LDLCALC UNABLE TO CALCULATE IF TRIGLYCERIDE OVER 400 mg/dL 07/28/2017 0259   HgbA1c:  Lab Results  Component Value Date   HGBA1C 5.2 07/28/2017   Urine Drug Screen:     Component Value Date/Time   LABOPIA NONE DETECTED 07/28/2017 0415   COCAINSCRNUR NONE DETECTED 07/28/2017 0415   LABBENZ NONE DETECTED 07/28/2017 0415   AMPHETMU NONE DETECTED 07/28/2017 0415   [place] DETECTED 07/28/2017 0415   LABBARB NONE DETECTED 07/28/2017 0415    Alcohol Level No results found for: [place] I have personally reviewed the radiological images below and agree with the radiology interpretations.  Ct Angio Head W Or Wo Contrast  Addendum Date: 07/27/2017   ADDENDUM REPORT: 07/27/2017 21:47 CONTRAST:  Total of 200 cc Omnipaque 370 administered. Electronically Signed   By: Kristine Garbe M.D.   On: 07/27/2017 21:47    Result Date: 07/27/2017 CLINICAL DATA:  42 y/o  M; right-sided numbness, acute stroke. EXAM: CT ANGIOGRAPHY HEAD AND NECK CT PERFUSION BRAIN TECHNIQUE: Multidetector CT imaging of the head and neck was performed using the standard protocol during bolus administration of intravenous contrast. Multiplanar CT image reconstructions and MIPs were obtained to evaluate the vascular anatomy. Carotid stenosis measurements (when applicable) are obtained utilizing NASCET criteria, using the distal internal carotid diameter as the denominator. Multiphase CT imaging of the brain was performed following IV bolus contrast injection. Subsequent parametric perfusion maps were calculated using RAPID software. CONTRAST:  See addendum. COMPARISON:  07/27/2016 CT head FINDINGS: CTA NECK FINDINGS Aortic arch: Standard branching. Imaged portion shows no evidence of aneurysm or dissection. No significant stenosis of the major arch vessel origins. Right carotid system: No evidence of dissection, stenosis (50% or greater) or occlusion. Left carotid system: No evidence of dissection, stenosis (50% or greater) or occlusion. Vertebral arteries: Right dominant. No evidence of dissection, stenosis (50% or greater) or occlusion. Skeleton: Negative. Other neck: Nodules in bilateral lobes of thyroid measuring up to 2.6 cm on the right (series 7 image 149). Upper chest: Negative. Review of the MIP images confirms the above findings CTA HEAD FINDINGS Anterior circulation: Left proximal M1 occlusion with intermediate left MCA distribution collateralization. Otherwise no significant stenosis, proximal occlusion, aneurysm, or vascular malformation in the anterior circulation. Posterior circulation: No significant stenosis, proximal occlusion, aneurysm, or vascular malformation. Venous sinuses: As permitted by contrast timing, patent. Anatomic variants: Bilateral fetal PCA. Small anterior communicating artery. Review of the MIP images confirms the  above findings CT Brain Perfusion Findings: CBF (<30%) Volume: 19m Perfusion (Tmax>6.0s) volume: 1538mMismatch Volume: 7573mnfarction Location:Left MCA distribution IMPRESSION: CTA head: 1. Left M1 occlusion with intermediate left MCA distribution collateralization. 2. Otherwise patent anterior and posterior intracranial circulation. No additional large vessel occlusion, aneurysm, or significant stenosis is identified. CTA neck: 1. Patent carotid and vertebral arteries. No dissection, aneurysm, or  significant stenosis by NASCET criteria. 2. Nodules in bilateral lobes of thyroid measuring up to 2.6 cm on the right. Thyroid ultrasound is recommended on a nonemergent basis. CT perfusion head: By automated RAPID quantification: Infarct core 82 cc, ischemic penumbra 75 cc, infarct location in left MCA distribution. These results were called by telephone at the time of interpretation on 07/27/2017 at 9:23 pm to Dr. Amie [place] , who verbally acknowledged these results. Electronically Signed: By: Kristine Garbe M.D. On: 07/27/2017 21:39   Ct Head Wo Contrast  Result Date: 07/28/2017 CLINICAL DATA:  Follow-up suspected LEFT middle cerebral artery extravasation during incomplete thrombectomy. EXAM: CT HEAD WITHOUT CONTRAST TECHNIQUE: Contiguous axial images were obtained from the base of the skull through the vertex without intravenous contrast. COMPARISON:  Multiple CT HEAD May 27, 2018 FINDINGS: BRAIN: Increased density LEFT MCA distribution extending into LEFT insula and LEFT frontoparietal sulci measuring to 91 Hounsfield units. Faint density interpeduncular cistern measuring 50 Hounsfield units. Confluent LEFT frontotemporal parietal hypodensity extending to LEFT basal ganglia. No convincing evidence of intraparenchymal hemorrhage. Regional LEFT cerebrum mass effect without midline shift. No hydrocephalus. Basal cisterns are patent. 1 cm cyst in caudal aspect splenium of corpus callosum versus  pineal cyst, less likely. VASCULAR: Dense LEFT MCA (55 Hounsfield units). New density along anterior cerebral artery distribution. SKULL/SOFT TISSUES: No skull fracture. No significant soft tissue swelling. ORBITS/SINUSES: The included ocular globes and orbital contents are normal.Mild paranasal sinusitis. Life-support lines in place. Mastoid air cells are well aerated. OTHER: None. IMPRESSION: 1. Evolving acute large LEFT MCA territory infarct without hemorrhagic conversion. 2. Postprocedural extra-axial contrast extravasation in addition to probable contrast staining and small volume subarachnoid hemorrhage. 3. New density anterior cerebral artery concerning for thromboembolism. Critical Value/emergent results were called by telephone at the time of interpretation on 07/28/2017 at 4:00 am to Dr. Amie Portland , who verbally acknowledged these results. Electronically Signed   By: Elon Alas M.D.   On: 07/28/2017 04:16   Ct Angio Neck W Or Wo Contrast  Addendum Date: 07/27/2017   ADDENDUM REPORT: 07/27/2017 21:47 CONTRAST:  Total of 200 cc Omnipaque 370 administered. Electronically Signed   By: Kristine Garbe M.D.   On: 07/27/2017 21:47   Result Date: 07/27/2017 CLINICAL DATA:  42 y/o  M; right-sided numbness, acute stroke. EXAM: CT ANGIOGRAPHY HEAD AND NECK CT PERFUSION BRAIN TECHNIQUE: Multidetector CT imaging of the head and neck was performed using the standard protocol during bolus administration of intravenous contrast. Multiplanar CT image reconstructions and MIPs were obtained to evaluate the vascular anatomy. Carotid stenosis measurements (when applicable) are obtained utilizing NASCET criteria, using the distal internal carotid diameter as the denominator. Multiphase CT imaging of the brain was performed following IV bolus contrast injection. Subsequent parametric perfusion maps were calculated using RAPID software. CONTRAST:  See addendum. COMPARISON:  07/27/2016 CT head FINDINGS: CTA  NECK FINDINGS Aortic arch: Standard branching. Imaged portion shows no evidence of aneurysm or dissection. No significant stenosis of the major arch vessel origins. Right carotid system: No evidence of dissection, stenosis (50% or greater) or occlusion. Left carotid system: No evidence of dissection, stenosis (50% or greater) or occlusion. Vertebral arteries: Right dominant. No evidence of dissection, stenosis (50% or greater) or occlusion. Skeleton: Negative. Other neck: Nodules in bilateral lobes of thyroid measuring up to 2.6 cm on the right (series 7 image 149). Upper chest: Negative. Review of the MIP images confirms the above findings CTA HEAD FINDINGS Anterior circulation: Left proximal M1 occlusion  with intermediate left MCA distribution collateralization. Otherwise no significant stenosis, proximal occlusion, aneurysm, or vascular malformation in the anterior circulation. Posterior circulation: No significant stenosis, proximal occlusion, aneurysm, or vascular malformation. Venous sinuses: As permitted by contrast timing, patent. Anatomic variants: Bilateral fetal PCA. Small anterior communicating artery. Review of the MIP images confirms the above findings CT Brain Perfusion Findings: CBF (<30%) Volume: 52m Perfusion (Tmax>6.0s) volume: 159mMismatch Volume: 7534mnfarction Location:Left MCA distribution IMPRESSION: CTA head: 1. Left M1 occlusion with intermediate left MCA distribution collateralization. 2. Otherwise patent anterior and posterior intracranial circulation. No additional large vessel occlusion, aneurysm, or significant stenosis is identified. CTA neck: 1. Patent carotid and vertebral arteries. No dissection, aneurysm, or significant stenosis by NASCET criteria. 2. Nodules in bilateral lobes of thyroid measuring up to 2.6 cm on the right. Thyroid ultrasound is recommended on a nonemergent basis. CT perfusion head: By automated RAPID quantification: Infarct core 82 cc, ischemic penumbra 75  cc, infarct location in left MCA distribution. These results were called by telephone at the time of interpretation on 07/27/2017 at 9:23 pm to Dr. ASHAmie Portlandwho verbally acknowledged these results. Electronically Signed: By: LanKristine GarbeD. On: 07/27/2017 21:39   Ct Cerebral Perfusion W Contrast  Addendum Date: 07/27/2017   ADDENDUM REPORT: 07/27/2017 21:47 CONTRAST:  Total of 200 cc Omnipaque 370 administered. Electronically Signed   By: LanKristine GarbeD.   On: 07/27/2017 21:47   Result Date: 07/27/2017 CLINICAL DATA:  41 24[age]  M; right-sided numbness, acute stroke. EXAM: CT ANGIOGRAPHY HEAD AND NECK CT PERFUSION BRAIN TECHNIQUE: Multidetector CT imaging of the head and neck was performed using the standard protocol during bolus administration of intravenous contrast. Multiplanar CT image reconstructions and MIPs were obtained to evaluate the vascular anatomy. Carotid stenosis measurements (when applicable) are obtained utilizing NASCET criteria, using the distal internal carotid diameter as the denominator. Multiphase CT imaging of the brain was performed following IV bolus contrast injection. Subsequent parametric perfusion maps were calculated using RAPID software. CONTRAST:  See addendum. COMPARISON:  07/27/2016 CT head FINDINGS: CTA NECK FINDINGS Aortic arch: Standard branching. Imaged portion shows no evidence of aneurysm or dissection. No significant stenosis of the major arch vessel origins. Right carotid system: No evidence of dissection, stenosis (50% or greater) or occlusion. Left carotid system: No evidence of dissection, stenosis (50% or greater) or occlusion. Vertebral arteries: Right dominant. No evidence of dissection, stenosis (50% or greater) or occlusion. Skeleton: Negative. Other neck: Nodules in bilateral lobes of thyroid measuring up to 2.6 cm on the right (series 7 image 149). Upper chest: Negative. Review of the MIP images confirms the above findings CTA  HEAD FINDINGS Anterior circulation: Left proximal M1 occlusion with intermediate left MCA distribution collateralization. Otherwise no significant stenosis, proximal occlusion, aneurysm, or vascular malformation in the anterior circulation. Posterior circulation: No significant stenosis, proximal occlusion, aneurysm, or vascular malformation. Venous sinuses: As permitted by contrast timing, patent. Anatomic variants: Bilateral fetal PCA. Small anterior communicating artery. Review of the MIP images confirms the above findings CT Brain Perfusion Findings: CBF (<30%) Volume: 58m71mrfusion (Tmax>6.0s) volume: 157mL40mmatch Volume: 75mL 56mrction Location:Left MCA distribution IMPRESSION: CTA head: 1. Left M1 occlusion with intermediate left MCA distribution collateralization. 2. Otherwise patent anterior and posterior intracranial circulation. No additional large vessel occlusion, aneurysm, or significant stenosis is identified. CTA neck: 1. Patent carotid and vertebral arteries. No dissection, aneurysm, or significant stenosis by NASCET criteria. 2. Nodules in bilateral lobes of thyroid measuring up to  2.6 cm on the right. Thyroid ultrasound is recommended on a nonemergent basis. CT perfusion head: By automated RAPID quantification: Infarct core 82 cc, ischemic penumbra 75 cc, infarct location in left MCA distribution. These results were called by telephone at the time of interpretation on 07/27/2017 at 9:23 pm to Dr. Amie [place] , who verbally acknowledged these results. Electronically Signed: By: Kristine Garbe M.D. On: 07/27/2017 21:39   Dg Chest Port 1 View  Result Date: 07/28/2017 CLINICAL DATA:  42 year old male status post intubation. EXAM: PORTABLE CHEST 1 VIEW COMPARISON:  None. FINDINGS: An enteric tube is seen with side-port in the region of the gastroesophageal junction and tip in the proximal stomach. Recommend further advancing of the tube into the stomach by approximately 3-4 cm. A  partially visualized structure with tip at the level of the T1 may represent the endotracheal tube. Clinical correlation is recommended. If an endotracheal tube is present recommend further advancing the tube by approximately 3 cm for optimal positioning. The lungs are clear. There is no pleural effusion or pneumothorax. The cardiac silhouette is within normal limits. No acute osseous pathology. IMPRESSION: 1. Enteric tube with tip in the stomach and side-port in the region of the gastroesophageal junction. Recommend advancing the tube further into the stomach for optimal positioning. A partially visualized density over the T1 may represent the tip of the endotracheal tube. If an endotracheal tube is present recommend further advancing by approximately 3 cm for optimal positioning. 2. No acute cardiopulmonary process. Electronically Signed   By: Anner Crete M.D.   On: 07/28/2017 01:18   Ct Head Code Stroke Wo Contrast  Result Date: 07/27/2017 CLINICAL DATA:  Code stroke. 42 y/o M; right-sided weakness and facial droop. EXAM: CT HEAD WITHOUT CONTRAST TECHNIQUE: Contiguous axial images were obtained from the base of the skull through the vertex without intravenous contrast. COMPARISON:  None. FINDINGS: Brain: Faint lucency involving left insula and frontal operculum. No significant mass effect or hemorrhage at this time. No hydrocephalus, basilar cistern effacement, or extra-axial collection. Vascular: Dense left M1. Skull: Normal. Negative for fracture or focal lesion. Sinuses/Orbits: No acute finding. Other: None. ASPECTS [facility] Stroke Program Early CT Score) - Ganglionic level infarction (caudate, lentiform nuclei, internal capsule, insula, M1-M3 cortex): 5 - Supraganglionic infarction (M4-M6 cortex): 3 Total score (0-10 with 10 being normal): 8 IMPRESSION: 1. Left insula and frontal operculum lucency compatible with infarction. No hemorrhage or mass effect. 2. Left M1 hyperdensity, likely thrombus. 3.  ASPECTS is 8 These results were called by telephone at the time of interpretation on 07/27/2017 at 9:10 pm to Dr. Rory Percy , who verbally acknowledged these results. Electronically Signed   By: Kristine Garbe M.D.   On: 07/27/2017 21:12   CT head 05/28/18 10am 1. Progressive left MCA territory infarct with further involvement of the left caudate head and lentiform nucleus. 2. Extensive nonhemorrhagic infarcts involving the left temporal tip, left frontal operculum, left parietal lobe, and medial left frontal lobe, the distal left ACA territory. 3. Contrast extravasation compatible with perforation into the left sylvian fissure, the sulci over the convexity, and the interpeduncular notch. 4. Minimal midline shift. 5. Fluid in the sinuses likely secondary to intubation.  Transthoracic Echocardiogram - Left ventricle: The cavity size was normal. Wall thickness was   increased in a pattern of mild LVH. Systolic function was normal.   The estimated ejection fraction was in the range of 60% to 65%.   Wall motion was normal; there were no regional  wall motion   abnormalities. Left ventricular diastolic function parameters   were normal. - Atrial septum: No defect or patent foramen ovale was identified.  Bilateral LE venous Dopplers - No evidence of deep vein thrombosis or baker's cysts bilaterally.   Ct Head Wo Contrast  Result Date: 07/29/2017 CLINICAL DATA:  Stroke. EXAM: CT HEAD WITHOUT CONTRAST TECHNIQUE: Contiguous axial images were obtained from the base of the skull through the vertex without intravenous contrast. COMPARISON:  MRI brain/12/19.  CT head 07/28/2017. FINDINGS: Brain: A large left MCA and ACA territory infarct is again noted. There is diffuse hypoattenuation involving affected areas of the left frontal and parietal lobe. The superior and anterior temporal lobe is involved. Contrast and hemorrhage within the sylvian fissure and along the left MCA is again noted. No new  hemorrhage is present. There is increasing mass effect with effacement of the left lateral ventricle and now 10 mm of midline shift. There is increasing prominence of the right temporal horn compatible with mass effect on the lateral ventricles. Effacement of the para mesencephalic cistern on the right suggest developing uncal herniation. Brainstem and cerebellum are unremarkable. Vascular: Hyperdense left MCA and branches compatible with contrast and thrombus. Skull: Calvarium is intact. No focal lytic or blastic lesions are present. Sinuses/Orbits: Increased fluid is present in the left sphenoid sinus. There is mild mucosal thickening of the left maxillary sinus. Globes and orbits are within normal limits. IMPRESSION: 1. Evolving left MCA and ACA territory infarct increasing mass effect and midline shift, now measuring 10 mm. 2. Mild increasing dilation of the right temporal tip compatible with left-sided effacement. 3. Developing uncal herniation without significant downward herniation of the brainstem. 4. Subarachnoid contrast and hemorrhage again noted within the sylvian fissure. Electronically Signed   By: San Morelle M.D.   On: 07/29/2017 06:40   Mr Brain Wo Contrast  Result Date: 07/28/2017 CLINICAL DATA:  Follow-up LEFT-sided stroke. Status post tPA July 27, 2017. EXAM: MRI HEAD WITHOUT CONTRAST TECHNIQUE: Multiplanar, multiecho pulse sequences of the brain and surrounding structures were obtained without intravenous contrast. COMPARISON:  CT HEAD July 28, 2017 at 0852 hours FINDINGS: BRAIN: Confluent LEFT frontotemporal parietal and LEFT basal ganglia reduced diffusion with involvement of the mesial LEFT parietal and frontal lobes. Low ADC values. Susceptibility artifact within LEFT sylvian fissure and LEFT frontal parietal sulci. No intraparenchymal hemorrhage. Worsening 8 mm LEFT-to-RIGHT midline shift, increased from 2 mm. Partially effaced LEFT lateral ventricle with early RIGHT  lateral ventricle entrapment. 11 mm T2 bright simple cyst within or adjacent to splenium of corpus callosum. No significant extra-axial fluid accumulation. VASCULAR: Normal major intracranial vascular flow voids present at skull base. SKULL AND UPPER CERVICAL SPINE: No abnormal sellar expansion. No suspicious calvarial bone marrow signal. Craniocervical junction maintained. SINUSES/ORBITS: Secretions layering in scratches at layering secretions in paranasal sinuses. Life-support lines in place. Mastoid air cells are well aerated. The included ocular globes and orbital contents are non-suspicious. OTHER: None. IMPRESSION: 1. Evolving acute large LEFT MCA territory nonhemorrhagic infarct. Evolving acute distal LEFT ACA territory nonhemorrhagic infarct. 2. Worsening 8 mm LEFT-to-RIGHT midline shift with early RIGHT ventricle entrapment. 3. Susceptibility artifact LEFT sylvian fissure and LEFT sulci corresponding to known extra-axial hemorrhage. Acute findings text paged to [place] via AMION secure system on 07/28/2017 at 10:37 pm. Electronically Signed   By: Elon Alas M.D.   On: 07/28/2017 22:38   Ct Head Wo Contrast  Result Date: 07/31/2017 CLINICAL DATA:  Stroke, follow-up craniotomy. EXAM: CT  HEAD WITHOUT CONTRAST TECHNIQUE: Contiguous axial images were obtained from the base of the skull through the vertex without intravenous contrast. COMPARISON:  CT HEAD July 29, 2017 and MRI of the head July 28, 2017 FINDINGS: BRAIN: Mild external herniation LEFT cerebrum via craniectomy defect. Evolving large LEFT acute to subacute MCA territory infarct without hemorrhagic conversion. Moderate LEFT frontoparietal ACA territory infarct. Regional mass effect without midline shift. Persistent partial LEFT lateral ventricle effacement. Resolving RIGHT ventricular entrapment. Mild residual LEFT uncal herniation. Patent basal cisterns. VASCULAR: Dense LEFT MCA consistent with thromboembolism and, potentially  trapped extravasated contrast. Focally dense LEFT ACA. SKULL/SOFT TISSUES: Interval LEFT decompressive craniectomy. LEFT scalp soft tissue swelling, subcutaneous gas with overlying skin staples. ORBITS/SINUSES: The included ocular globes and orbital contents are normal.Mild paranasal sinus mucosal thickening. LEFT sphenoid sinus air-fluid level. Mastoid air cells are well aerated. OTHER: Life-support lines in place. IMPRESSION: 1. Evolving large LEFT MCA and moderate LEFT ACA territory infarct without hemorrhagic conversion. 2. Interval decompressive LEFT craniectomy with mild external herniation of LEFT cerebrum. Resolution of midline shift. 3. Resolving RIGHT ventricular entrapment. Electronically Signed   By: Elon Alas M.D.   On: 07/31/2017 04:26   Ct Head Wo Contrast  Result Date: 08/02/2017 CLINICAL DATA:  42 y/o  M; left MCA and ACA infarcts for follow-up. EXAM: CT HEAD WITHOUT CONTRAST TECHNIQUE: Contiguous axial images were obtained from the base of the skull through the vertex without intravenous contrast. COMPARISON:  07/31/2016 CT head FINDINGS: Brain: Stable distribution of left MCA and ACA infarctions. Stable edema with herniation of the left cerebral convexity with large craniectomy defect, effacement of left lateral ventricle, left uncal herniation, and 4 mm left-to-right midline shift. No downward herniation identified. Stable right ventricle size. No new stroke. No interval hemorrhage. Vascular: Hyperdensity within the left M1 and distal vessels likely representing thrombus. Skull: Postsurgical changes related to left hemi craniectomy stable edema in the overlying scalp and skin staples. Sinuses/Orbits: Sphenoid sinus opacification with fluid levels, partial opacification of left mastoid air cells, and left maxillary sinus mucosal thickening, probably due to intubation. Orbits are unremarkable. Other: None. IMPRESSION: 1. Stable distribution of left MCA and ACA infarctions. No new  infarction or hemorrhage. 2. Stable mass effect with 4 mm left-to-right midline shift and mild herniation via craniectomy. Electronically Signed   By: Kristine Garbe M.D.   On: 08/02/2017 05:06   Dg Chest Port 1 View  Result Date: 08/01/2017 CLINICAL DATA:  42 year old male status post cerebral emergent large vessel occlusion, large left MCA infarct. Intubated. EXAM: PORTABLE CHEST 1 VIEW COMPARISON:  07/31/2017 and earlier. FINDINGS: Portable AP semi upright view at 0947 hrs. Stable endotracheal tube tip at the level the clavicles. Stable enteric tube, side hole projects at the proximal stomach. Stable right subclavian central line. Stable lung volumes. Allowing for portable technique the lungs are clear. Normal cardiac size and mediastinal contours. Negative visible bowel gas pattern. IMPRESSION: 1.  Stable lines and tubes. 2.  No acute cardiopulmonary abnormality. Electronically Signed   By: Genevie Ann M.D.   On: 08/01/2017 10:10     PHYSICAL EXAM  Temp:  [98.4 F (36.9 C)-100.5 F (38.1 C)] 99 F (37.2 C) (01/17 0800) Pulse Rate:  [78-115] 93 (01/17 0901) Resp:  [17-23] 20 (01/17 0901) BP: (138-163)/(93-112) 151/98 (01/17 0901) SpO2:  [98 %-100 %] 100 % (01/17 0901) FiO2 (%):  [30 %-40 %] 30 % (01/17 0901) Weight:  [188 lb 15 oz (85.7 kg)] 188 lb 15 oz (85.7  kg) (01/17 0500)  General - Well nourished, well developed, intubated.  Ophthalmologic - Fundi not visualized due to eye movement.  Cardiovascular - Regular rate and rhythm.  Neuro - intubated, off sedation, awake alert and eyes open, left eyelid swelling,  PERRL, left gaze preference, barely cross midline, blinking to visual threat on the left, but not able to the left.  Able to follow all simple commands.  Facial symmetry not able to test due to ET tube.  Left upper extremity and lower extremity at least 4+/5, right upper and lower extremity dense hemiplegia.  DTR diminished on the right, no Babinski.  Sensation and  coordination not cooperative.  Gait not tested.   ASSESSMENT/PLAN [name] is a 42 y.o. male with history of HTN, OSA presenting with right-sided weakness, left gaze and right neglect. He receive IV t-PA.  However, unsuccessful for mechanical thrombectomy with complication of left SAH.  Stroke: Left large MCA infarct status post TPA with unsuccessful IR attempt causing left SAH.  Resultant  left gaze, right hemiplegia, right neglect  CT head left MCA  hyperdense  CT head and neck left MCA occlusion  DSA with unsuccessful IR attempt causing left SAH  CT repeat showed left MCA large infarct with left stable SAH and stable subtle midline shift  MRI head large left MCA and ACA infarct with midline shift  Repeat CT 07/31/17 - much improved midline shift after hemicrani  2D Echo - EF 60-65%  LE venous Doppler no DVT  Consider TEE later when stable, likely next week  LDL NTC due to high TG  HgbA1c - 5.2  UDS negative  VTE prophylaxis -SCDs Diet NPO time specified  No antithrombotic prior to admission, now on ASA 373m.   Ongoing aggressive stroke risk factor management  Therapy recommendations:  pending  Disposition:  Pending  Cerebral edema s/p decompressive hemicrani  CT head showed large left MCA infarct  MRI large left MCA and ACA infarcts with midline shift  CT repeat - increasing midline shift  CT repeat 07/31/17 - mild midline shift  Repeat CT 08/02/17 - stable midline shift  on 3% saline  Na 144->150->153->152->153  Sodium goal 150 - 155  23.4% saline PRN  Neurosurgery on board - s/p hemi craniectomy   Fever - likely related to surgery  Tmax 100.4  On temperature control protocol  Off arctic sun  WBC 11.2->10.6->11.2->9.9  Hypertension  Stable but elevated than before  Likely related to increased ICP  [facility] < [facility]  Home meds: None  LDL NTC due to high TG, goal < 70  TG 417  On zetia and  lipitor  Continue on discharge  B12 deficiency  B12 161   B12 im supplement  Other Stroke Risk Factors  Obstructive sleep apnea  Other Active Problems  Intubated - extubate today  TSH mildly low but FT4 normal  Elevated ESR  Hospital day # 6  This patient is critically ill due to large left MCA infarct, SAH, cerebral edema and at significant risk of neurological worsening, death form brain herniation, seizure, heart failure. This patient's care requires constant monitoring of vital signs, hemodynamics, respiratory and cardiac monitoring, review of multiple databases, neurological assessment, discussion with family, other specialists and medical decision making of high complexity. I had long discussion with mom at bedside and wife over the phone, updated pt current condition, treatment plan and potential prognosis. They expressed understanding and appreciation. I also discussed with Dr.  Jeong in the hallway. I spent 35 minutes of neurocritical care time in the care of this patient.  Rosalin Hawking, MD PhD Stroke Neurology 08/02/2017 9:48 AM   To contact Stroke Continuity provider, please refer to [email or web address] After hours, contact General Neurology

## 2017-08-02 NOTE — Procedures (Signed)
Extubation Procedure Note  Patient Details:   Name: Jeremy [name] DOB: 1975-11-23 MRN: [id number]   Airway Documentation:     Evaluation  O2 sats: stable throughout Complications: No apparent complications Patient did tolerate procedure well. Bilateral Breath Sounds: Clear, Diminished   Yes pt able to vocalize.   Pt extubated at this time per MD order. Patient placed on4L [place] and tolerating well. Pt was able to breathe around deflated cuff. No stridor noted and patient has adequate cough.  Loyal Jacobsonhompson, Makita Blow [facility] 08/02/2017, 10:10 AM

## 2017-08-02 NOTE — Progress Notes (Signed)
Transported from 4N Rm 31 to CT and back with RN and Transport at bedside without any complications.

## 2017-08-03 ENCOUNTER — Inpatient Hospital Stay (HOSPITAL_COMMUNITY): Payer: BLUE CROSS/BLUE SHIELD

## 2017-08-03 DIAGNOSIS — I69320 Aphasia following cerebral infarction: Secondary | ICD-10-CM

## 2017-08-03 DIAGNOSIS — E538 Deficiency of other specified B group vitamins: Secondary | ICD-10-CM

## 2017-08-03 DIAGNOSIS — Z9889 Other specified postprocedural states: Secondary | ICD-10-CM

## 2017-08-03 LAB — SODIUM
SODIUM: 149 mmol/L — AB (ref 135–145)
SODIUM: 144 mmol/L (ref 135–145)

## 2017-08-03 LAB — BASIC METABOLIC PANEL
ANION GAP: 11 (ref 5–15)
BUN: 22 mg/dL — ABNORMAL HIGH (ref 6–20)
CALCIUM: 8.5 mg/dL — AB (ref 8.9–10.3)
CO2: 25 mmol/L (ref 22–32)
Chloride: 114 mmol/L — ABNORMAL HIGH (ref 101–111)
Creatinine, Ser: 0.84 mg/dL (ref 0.61–1.24)
GLUCOSE: 116 mg/dL — AB (ref 65–99)
POTASSIUM: 3.6 mmol/L (ref 3.5–5.1)
Sodium: 150 mmol/L — ABNORMAL HIGH (ref 135–145)

## 2017-08-03 LAB — GLUCOSE, CAPILLARY
GLUCOSE-CAPILLARY: 101 mg/dL — AB (ref 65–99)
GLUCOSE-CAPILLARY: 108 mg/dL — AB (ref 65–99)
GLUCOSE-CAPILLARY: 118 mg/dL — AB (ref 65–99)
Glucose-Capillary: 104 mg/dL — ABNORMAL HIGH (ref 65–99)
Glucose-Capillary: 116 mg/dL — ABNORMAL HIGH (ref 65–99)
Glucose-Capillary: 121 mg/dL — ABNORMAL HIGH (ref 65–99)
Glucose-Capillary: 90 mg/dL (ref 65–99)

## 2017-08-03 LAB — MAGNESIUM: Magnesium: 2.3 mg/dL (ref 1.7–2.4)

## 2017-08-03 LAB — PHOSPHORUS: PHOSPHORUS: 3.2 mg/dL (ref 2.5–4.6)

## 2017-08-03 MED ORDER — RESOURCE THICKENUP CLEAR PO POWD
Freq: Once | ORAL | Status: AC
  Administered 2017-08-03: 22:00:00 via ORAL
  Filled 2017-08-03: qty 125

## 2017-08-03 NOTE — Progress Notes (Signed)
Pt vomitted x1.  Witnessed by myself.  Pt was in upright position and showed no s/s of aspiration.  RR HR SpO2 all remained as they have been.  Lung sounds clear.  Pt denies nausea.  States, "I drink the juice too fast."  Educated pt's family at this time to do smaller sips and to ALWAYS make sure pt is sitting upright.

## 2017-08-03 NOTE — Evaluation (Signed)
Clinical/Bedside Swallow Evaluation Patient Details  Name: Colleen Donahoe MRN: [id number] Date of Birth: 14-Oct-1975  Today's Date: 08/03/2017 Time: SLP Start Time (ACUTE ONLY): 1110 SLP Stop Time (ACUTE ONLY): 1120 SLP Time Calculation (min) (ACUTE ONLY): 10 min  Past Medical History:  Past Medical History:  Diagnosis Date  . Anxiety   . Asthma    MILD AS A CHILD  . Depression   . Deviated septum   . Dyspnea    NORMAL SPIROMETRY 02/01/17 VISIT WITH DR HEDRICK  . Hypertension   . Sleep apnea    Past Surgical History:  Past Surgical History:  Procedure Laterality Date  . CRANIOTOMY Left 07/29/2017   Procedure: LEFT HEMI- CRANIECTOMY WITH PLACEMENT OF BONE FLAP IN ABDOMEN;  Surgeon: Lisbeth Renshaw, MD;  Location: [facility] OR;  Service: Neurosurgery;  Laterality: Left;  . IR ANGIO INTRA EXTRACRAN SEL COM CAROTID INNOMINATE UNI R MOD SED  07/28/2017  . IR ANGIO VERTEBRAL SEL VERTEBRAL UNI R MOD SED  07/28/2017  . IR PERCUTANEOUS ART THROMBECTOMY/INFUSION INTRACRANIAL INC DIAG ANGIO  07/28/2017  . NASAL SEPTOPLASTY W/ TURBINOPLASTY Bilateral 03/06/2017   Procedure: NASAL SEPTOPLASTY WITH TURBINATE REDUCTION;  Surgeon: Linus Salmons, MD;  Location: [facility] ORS;  Service: ENT;  Laterality: Bilateral;  . RADIOLOGY WITH ANESTHESIA N/A 07/27/2017   Procedure: RADIOLOGY WITH ANESTHESIA;  Surgeon: Julieanne Cotton, MD;  Location: [facility] OR;  Service: Radiology;  Laterality: N/A;   HPI:  42 y/o male with OSA, HTN was admitted on 1/11 with abrupt onset R facial droop. He was found to have evidence of a left MCA stroke. He was given IV TP and went unsuccessful IR attempt for thrombectomy. Some small subarachnoid bleeding and extravasation post thromobectomy. CT imaging from 1/12 shows Progressive left MCA territory infarct with further involvement of the left caudate head and lentiform nucleus, Extensive nonhemorrhagic infarcts involving the left temporal tip, left frontal operculum, left parietal lobe, and  medial left frontal lobe, the distal left ACA territory. Contrast extravasation compatible with perforation into the left sylvian fissure, the sulci over the convexity, and the interpeduncular notch. Taken for decompressive hemicrani 1/13. Intubated from 1/11 to 1/17.    Assessment / Plan / Recommendation Clinical Impression  Pt demosntrates multiple risk factors for dysphagia including several days of intubation, at least CN VII and V motor and likely sensory impairment with potential for CN X impairment as well. Pt able to orally manipulate liquids and purees despite facial weakness, though head was  tilted left during assessment with possible benefit of diversion of bolus to stronger side. No overt coughing or throat clearing observed with PO trials, though pt did slightly grunt after the swallow on several trials. Recommend objective testing given potential for silent aspiration. Pt in agreement.       Aspiration Risk  Moderate aspiration risk    Diet Recommendation NPO        Other  Recommendations     Follow up Recommendations        Frequency and Duration            Prognosis        Swallow Study   General HPI: 42 y/o male with OSA, HTN was admitted on 1/11 with abrupt onset R facial droop. He was found to have evidence of a left MCA stroke. He was given IV TP and went unsuccessful IR attempt for thrombectomy. Some small subarachnoid bleeding and extravasation post thromobectomy. CT imaging from 1/12 shows Progressive left MCA territory infarct with further  involvement of the left caudate head and lentiform nucleus, Extensive nonhemorrhagic infarcts involving the left temporal tip, left frontal operculum, left parietal lobe, and medial left frontal lobe, the distal left ACA territory. Contrast extravasation compatible with perforation into the left sylvian fissure, the sulci over the convexity, and the interpeduncular notch. Taken for decompressive hemicrani 1/13. Intubated  from 1/11 to 1/17.  Type of Study: Bedside Swallow Evaluation Diet Prior to this Study: NPO Temperature Spikes Noted: No Respiratory Status: Room air History of Recent Intubation: Yes Length of Intubations (days): 7 days Date extubated: 08/02/17 Behavior/Cognition: Alert;Cooperative Oral Cavity Assessment: Within Functional Limits Oral Care Completed by SLP: No Oral Cavity - Dentition: Adequate natural dentition Vision: Impaired for self-feeding Self-Feeding Abilities: Needs assist Patient Positioning: Upright in chair Baseline Vocal Quality: Aphonic Volitional Cough: Weak Volitional Swallow: Able to elicit    Oral/Motor/Sensory Function Overall Oral Motor/Sensory Function: Severe impairment Facial ROM: Reduced right;Suspected CN VII (facial) dysfunction Facial Symmetry: Abnormal symmetry right;Suspected CN VII (facial) dysfunction Facial Strength: Reduced right;Suspected CN VII (facial) dysfunction Facial Sensation: Reduced right;Suspected CN V (Trigeminal) dysfunction Lingual ROM: Other (Comment)(questionable, could not fully visualize movement/symmetry) Mandible: Impaired;Suspected CN V (Trigeminal) dysfunction(lateral pterygoid - only excursionimpaired)   Ice Chips Ice chips: Within functional limits Presentation: Spoon   Thin Liquid Thin Liquid: Impaired Presentation: Cup;Straw;Self Fed Pharyngeal  Phase Impairments: Throat Clearing - Immediate(more of a grunt)    Nectar Thick Nectar Thick Liquid: Not tested   Honey Thick Honey Thick Liquid: Not tested   Puree Puree: Within functional limits   Solid   GO           Harlon DittyBonnie Ted Goodner, MA CCC-SLP [phone]  Claudine MoutonDeBlois, [place] 08/03/2017,1:10 PM

## 2017-08-03 NOTE — Progress Notes (Signed)
PULMONARY / CRITICAL CARE MEDICINE   Name: Jeremy [name] MRN: [id number] DOB: [birth year]    ADMISSION DATE:  07/27/2017 CONSULTATION DATE:  07/27/2017   REFERRING MD:  Dr. Wilford CornerArora  CHIEF COMPLAINT:  Vent management post stroke  HISTORY OF PRESENT ILLNESS:   42 y/o male with OSA, HTN was admitted on 1/11 with abrupt onset R facial droop.  He was found to have evidence of a left MCA stroke. He was given IV TP and went unsuccessful IR attempt for thrombectomy.   Of note, patient reported headache, fever 103, and flu like symptoms for 2-3 days prior to presentation on 1/11.  WBC normal on admit, low grade temp.  SUBJECTIVE:  Looks fantastic no distress  VITAL SIGNS: BP (!) 136/96   Pulse 74   Temp 99.1 F (37.3 C) (Oral)   Resp 19   Ht 5\' 10"  (1.778 m)   Wt 188 lb 15 oz (85.7 kg)   SpO2 98%   BMI 27.11 kg/m    HEMODYNAMICS:    VENTILATOR SETTINGS: Vent Mode: CPAP;PSV FiO2 (%):  [30 %-40 %] 30 % PEEP:  [5 cmH20] 5 cmH20 Pressure Support:  [5 cmH20] 5 cmH20  INTAKE / OUTPUT:  Intake/Output Summary (Last 24 hours) at 08/03/2017 0830 Last data filed at 08/03/2017 0700 Gross per 24 hour  Intake 770 ml  Output 2075 ml  Net -1305 ml     PHYSICAL EXAMINATION: General: This is a well-nourished well-developed 42 year old male patient is currently resting comfortably in bed HEENT: Normocephalic atraumatic.  He has right-sided facial droop, his phonation is very weak, he speaks only in a whisper. Pulmonary: Clear to auscultation no accessory use Cardiac: Regular rate and rhythm Abdomen: Soft nontender.  Staples intact to abdomen where skull flap was placed Extremities: Mild dependent edema, otherwise brisk cap refill warm, strong pulses. Neuro: Awake, oriented x3.  Moves left side, however right side hemiplegic GU: Clear yellow urine LABS:  BMET Recent Labs  Lab 08/01/17 0433  08/02/17 0523  08/02/17 1816 08/02/17 2259 08/03/17 0503  NA 152*   < > 153*   < > 153* 152* 150*   K 3.4*  --  3.5  --   --   --  3.6  CL 119*  --  118*  --   --   --  114*  CO2 25  --  26  --   --   --  25  BUN 17  --  21*  --   --   --  22*  CREATININE 0.78  --  0.84  --   --   --  0.84  GLUCOSE 157*  --  131*  --   --   --  116*   < > = values in this interval not displayed.    Electrolytes Recent Labs  Lab 07/28/17 0259  08/01/17 0433 08/02/17 0523 08/03/17 0503  CALCIUM 8.2*   < > 8.2* 8.1* 8.5*  MG 2.2  --  2.3  --  2.3  PHOS 3.2  --  1.8*  --  3.2   < > = values in this interval not displayed.    CBC Recent Labs  Lab 07/31/17 0317 08/01/17 0433 08/02/17 0523  WBC 10.6* 11.2* 9.9  HGB 10.5* 9.8* 9.8*  HCT 33.0* 30.2* 30.4*  PLT 193 235 292    Coag's Recent Labs  Lab 07/27/17 2055  APTT 25  INR 0.98    Sepsis Markers No results for input(s): LATICACIDVEN, PROCALCITON,  O2SATVEN in the last 168 hours.  ABG Recent Labs  Lab 07/28/17 0137 08/02/17 0405  PHART 7.341* 7.455*  PCO2ART 43.2 40.5  PO2ART 170.0* 141*    Liver Enzymes Recent Labs  Lab 07/27/17 2055  AST 48*  ALT 98*  ALKPHOS 40  BILITOT 1.1  ALBUMIN 4.2    Cardiac Enzymes No results for input(s): TROPONINI, PROBNP in the last 168 hours.  Glucose Recent Labs  Lab 08/02/17 0803 08/02/17 1110 08/02/17 1542 08/02/17 2003 08/03/17 0017 08/03/17 0407  GLUCAP 147* 123* 111* 84 104* 101*    Imaging No results found.   STUDIES:  1/11 CT head L MCA sign 1/11 CT angio neck/head> L M1 MCA occlusion 1/12 CT head :  CT head : left MCA distribution infarct, some extravasation of contrast in sylvian fissure, small volume subarachnoid hemorrhage, fluid in sinuses  1/12 CT head >>  Progressive L MCA territory infarct with further involvement of left caudate head and lentiform nucleus; extensive nonhemorrhagic infarcts of left temporal tip, left frontal operculum, left parietal lobe, medial left front lobe distal left ACA territory; contrast extravasation compatible w/ perforation  into left sylvian fissure, sulci, and interpeduncular notch; minimal midline shift  1/12 MRI Brain >> 1. Evolving acute large LEFT MCA territory nonhemorrhagic infarct. Evolving acute distal LEFT ACA territory nonhemorrhagic infarct. 2. Worsening 8 mm LEFT-to-RIGHT midline shift with early RIGHT ventricle entrapment. 3. Susceptibility artifact LEFT sylvian fissure and LEFT sulci corresponding to known extra-axial hemorrhage.  1/13 CT head >> 1. Evolving left MCA and ACA territory infarct increasing mass effect and midline shift, now measuring 10 mm. 2. Mild increasing dilation of the right temporal tip compatible with left-sided effacement. 3. Developing uncal herniation without significant downward herniation of the brainstem. 4. Subarachnoid contrast and hemorrhage again noted within the sylvian fissure.  TTE 1/13 >> EF 60-65%, mild LVH  1/15 CT head >>  1. Evolving large LEFT MCA and moderate LEFT ACA territory infarct without hemorrhagic conversion. 2. Interval decompressive LEFT craniectomy with mild external herniation of LEFT cerebrum. Resolution of midline shift. 3. Resolving RIGHT ventricular entrapment.  08/02/2017 CT of the head no significant changes  CULTURES: MRSA PCR 1/12 >>  negatives [facility] 1/14 >>  ANTIBIOTICS: Cefazolin 1/11 >> 1/13  SIGNIFICANT EVENTS: 1/11 admission for R MCA stroke: IV tPA and failed IR Thrombectomy 1/12 3% Na started 1/13 Left decompressive hemicraniectomy  LINES/TUBES: 1/11 ETT > 06/02/2018 1/11 OGT > 06/02/2018 1/11 Foley >> 1/15 1/11 R [place] CVC >>   DISCUSSION: 42 yo male with subjective fever 103 and headache at home 2-3 days prior to presenting on 1/11 with right sided weakness found to have an acute L MCA stroke, had IR thrombectomy post TPA. Some small subarachnoid bleeding and extravasation post thromobectomy.  CT imaging from 1/12 shows evolving signs of MCA stroke. Taken for decompressive hemicrani 1/13.  Weaning well,  following commands on left .  Mechanically he appears ready for extubation, however the concern is his neuro status.  His last CT was improved some however extubation would not be without risk given he still at risk for worsening cerebral edema.  Repeat head CT 08/02/2017 no acute changes. 1/18 extubated, looks fantastic, still right-sided weakness  ASSESSMENT / PLAN:   Acute L MCA stroke s/p TPA and failed IR attempt causing Left SAH Cerebreal Edema s/p L decompressive hemicraniectomy w/ bone flap in abd SQ pocket - ? Neuro infection vs inflammation with subjective fever/ ha prior to admit/ CVA; ddx temporal  arteritis or vasculitis  - bilateral LE venous dopplers neg 1/12 - TTE showed no defect or PFO -Most recent CT showed decreased edema -Extubated on 1/17 -TTE 1/13 without acute findings or defects in atrial septum, no PFO Plan Discontinue sedation Neuro checks per protocol Rehab services consultation including PT, OT, and SLP  Hypertonic saline protocol per neurology Bruit to right Avoid fever  Cont lipitor and zetia   Weak phonation -Hopefully this is just residual from endotracheal tube, however could consider vocal cord injury following oral intubation Plan Continue supportive care, if vocal quality does not improve in the next 72 hours may need to consider ENT evaluation  Intermittent fluid and electrolyte imbalance.  Currently on therapeutic hypernatremia protocol Plan Continue hypernatremia protocol Serial chemistries  Mild hyperglycemia HgbA1c - 5.2 Plan: Sliding scale insulin   Mild anemia- stable Jehovah witness Plan Minimize blood draws SCDs for for DVT prophylaxis   Subjective fever 103 x 2-3 w/headache prior to presenting with acute L MCA CVA (admitting diagnosis) -White blood cell stable; no significant fever -Culture data negative to date 08/02/2017, including respiratory viral panel -He does have elevated rheumatoid factor, I am unsure of the  significance of this ANA was negative; ANCA  titers were also negative  Plan Continue trend fever and white blood cell curve  FAMILY  - Updates: 08/02/2017 no family at bedside.   Critical care services will sign off Feel free to call if we can be of further service  Simonne Martinet ACNP-BC [facility] Pulmonary/Critical Care Pager # [phone] OR # [phone] if no answer

## 2017-08-03 NOTE — Progress Notes (Signed)
Notified Dr. Roda ShuttersXu of Na 149.  No change in orders.  "pt will taper off in next day"

## 2017-08-03 NOTE — Progress Notes (Signed)
Inpatient Rehabilitation  Met with patient at bedside and called spouse to discuss team's recommendation for IP Rehab.  Shared booklets, insurance verification letter, and answered initial questions.  Plan to follow up Monday for medical readiness, insurance authorization, and IP Rehab bed availability.  Call if questions.    Carmelia Roller., CCC/SLP Admission Coordinator  [facility]  Cell [phone]

## 2017-08-03 NOTE — Evaluation (Signed)
Physical Therapy Evaluation Patient Details Name: Jeremy [name] MRN: [id number] DOB: 02/03/1976 Today's Date: 08/03/2017   History of Present Illness  42 yo admitted with right sided weakness with Left MCA CVA s/p tPA with partial revascularization with crani and bone flap and tPA reversal. Pt intubated 1/11-1/17. PMhx: HTn, sleep apnea, depression  Clinical Impression  Pt pleasant, soft spoken and needing cues to verbalize and increase vocal volume. Pt with significant deficits of right hemiplegia, right visual field cut, right inattention, maintained left neck rotation, decreased balance, transfers and functional mobility who will benefit from acute therapy to maximize mobility, function, balance and safety to decrease burden of care and maximize quality of life. Pt educated for use of LUE and LLE to move and position Right side. Pt requires sequential cues for all mobility and placed towel roll on left side to promote midline cervical position end of session. RN educated for squat pivot transfer to left. Pt will benefit greatly from CIR as he was independent, working and LHD PTA.     Follow Up Recommendations CIR;Supervision/Assistance - 24 hour    Equipment Recommendations  Other (comment)(TBD with progression)    Recommendations for Other Services Rehab consult;OT consult     Precautions / Restrictions Precautions Precautions: Fall Precaution Comments: no bone flap left, right inattention, right visual field cut, right hemiplegia      Mobility  Bed Mobility Overal bed mobility: Needs Assistance Bed Mobility: Rolling;Sidelying to Sit Rolling: Mod assist;+2 for physical assistance Sidelying to sit: Max assist;+2 for physical assistance       General bed mobility comments: cues for sequence, use of rail, bending left knee with hand over hand assist to cross midline and reach for rail. Max assist to bring legs off of bed and elevate trunk  Transfers Overall transfer level: Needs  assistance   Transfers: Sit to/from [employer] Transfers Sit to Stand: Mod assist;+2 physical assistance   Squat pivot transfers: Mod assist     General transfer comment: mod +2 to stand from elevated surface with right knee blocked. Pt able to stand 2 min with bil UE support. Pt maintaining weight on LLE with knee blocked and assist able to shift weight to right without active quad activation on RLE. Marland Kitchen Squat pivot to left with cues for sequence and mod assist to direct trunk and pelvis with over the back technique  Ambulation/Gait             General Gait Details: unable  Stairs            Wheelchair Mobility    Modified Rankin (Stroke Patients Only) Modified Rankin (Stroke Patients Only) Pre-Morbid Rankin Score: No symptoms Modified Rankin: Severe disability     Balance Overall balance assessment: Needs assistance   Sitting balance-Leahy Scale: Poor Sitting balance - Comments: pt requires mod assist for sitting balance with cues for midline and impaired ability to activate core to achieve midline, unable to support trunk in midline with LUE Postural control: Posterior lean   Standing balance-Leahy Scale: Poor Standing balance comment: mod +2 for balance in standing                             Pertinent Vitals/Pain Pain Assessment: No/denies pain    Home Living Family/patient expects to be discharged to:: Private residence Living Arrangements: Spouse/significant other;Children Available Help at Discharge: Family;Available 24 hours/day Type of Home: Apartment Home Access: Stairs to enter   [employer]  of Steps: 3rd floor apartment Home Layout: One level Home Equipment: None      Prior Function Level of Independence: Independent         Comments: pt worked with Conservation officer, 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 and lives with wife and 8yo son     Hand Dominance   Dominant Hand: Left    Extremity/Trunk Assessment   Upper Extremity Assessment Upper  Extremity Assessment: RUE deficits/detail RUE Deficits / Details: no noted AROM or sensation RUE Sensation: decreased proprioception;decreased light touch    Lower Extremity Assessment Lower Extremity Assessment: RLE deficits/detail RLE Deficits / Details: no noted AROM or sensation RLE Sensation: decreased proprioception;decreased light touch    Cervical / Trunk Assessment Cervical / Trunk Assessment: Other exceptions Cervical / Trunk Exceptions: pt maintains left neck rotation  Communication   Communication: Other (comment)(pt very soft spoken and needing cues to use works not just nodding)  Cognition Arousal/Alertness: Awake/alert Behavior During Therapy: WFL for tasks assessed/performed Overall Cognitive Status: Impaired/Different from baseline Area of Impairment: Attention;Safety/judgement;Problem solving;Following commands                       Following Commands: Follows one step commands consistently;Follows one step commands with increased time Safety/Judgement: Decreased awareness of safety;Decreased awareness of deficits   Problem Solving: Slow processing;Decreased initiation;Difficulty sequencing;Requires verbal cues;Requires tactile cues        General Comments      Exercises     Assessment/Plan    PT Assessment Patient needs continued PT services  PT Problem List Decreased strength;Decreased mobility;Decreased safety awareness;Decreased coordination;Decreased activity tolerance;Decreased cognition;Decreased balance;Decreased knowledge of use of DME;Impaired sensation       PT Treatment Interventions Gait training;Therapeutic exercise;Patient/family education;Balance training;Functional mobility training;Neuromuscular re-education;DME instruction;Therapeutic activities;Cognitive remediation    PT Goals (Current goals can be found in the Care Plan section)  Acute Rehab PT Goals Patient Stated Goal: return to work and home PT Goal Formulation: With  patient Time For Goal Achievement: 08/17/17 Potential to Achieve Goals: Good    Frequency Min 4X/week   Barriers to discharge        Co-evaluation               AM-PAC PT "6 Clicks" Daily Activity  Outcome Measure Difficulty turning over in bed (including adjusting bedclothes, sheets and blankets)?: Unable Difficulty moving from lying on back to sitting on the side of the bed? : Unable Difficulty sitting down on and standing up from a chair with arms (e.g., wheelchair, bedside commode, etc,.)?: Unable Help needed moving to and from a bed to chair (including a wheelchair)?: A Lot Help needed walking in hospital room?: Total Help needed climbing 3-5 steps with a railing? : Total 6 Click Score: 7    End of Session Equipment Utilized During Treatment: Gait belt Activity Tolerance: Patient tolerated treatment well Patient left: in chair;with call bell/phone within reach;with chair alarm set Nurse Communication: Mobility status;Precautions PT Visit Diagnosis: Other abnormalities of gait and mobility (R26.89);Hemiplegia and hemiparesis;Other symptoms and signs involving the nervous system (R29.898) Hemiplegia - Right/Left: Right Hemiplegia - dominant/non-dominant: Non-dominant Hemiplegia - caused by: Cerebral infarction    Time: [id number] PT Time Calculation (min) (ACUTE ONLY): 28 min   Charges:   PT Evaluation $PT Eval Moderate Complexity: 1 Mod PT Treatments $Therapeutic Activity: 8-22 mins   PT G Codes:        Delaney MeigsMaija Tabor Anguel Delapena, PT [phone]   Adryan Druckenmiller B Sollie Vultaggio 08/03/2017, 9:20 AM

## 2017-08-03 NOTE — Progress Notes (Signed)
Modified Barium Swallow Progress Note  Patient Details  Name: Jeremy GashJao Vejar MRN: [id number] Date of Birth: 26-Jan-1976  Today's Date: 08/03/2017  Modified Barium Swallow completed.  Full report located under Chart Review in the Imaging Section.  Brief recommendations include the following:  Clinical Impression  Pt presents with a moderate oral, mild pharyngeal dysphagia marked by: 1) Sensorimotor deficits left oral cavity with reduced bolus cohesion/control, anterior loss, prolonged preparation of mechanical solids. 2) Inconsistent site of pharyngeal swallow trigger, likely dependent on laterality of bolus flow - at times, bolus reached pyriform spaces and flowed into larynx/trachea prior to laryngeal-vestibular closure. Thin liquids were silently aspirated before the swallow.  A larger volume of nectar-thick liquids was also aspirated before the swallow, but aspiration elicited a cough response which effectively ejected the aspirate. There was no further aspiration of nectar.  There was excellent pharyngeal motility with no residue post-swallow.  Pt could be cued to hold his head in a neutral position, but generally he turned head to left (following gaze preference) for most of study.  For now, recommend beginning with a dysphagia 1 diet with nectar-thick liquids; meds whole in puree.  Despite significant CN involvement, anticipate pt will make rapid gains.     Swallow Evaluation Recommendations       SLP Diet Recommendations: Nectar thick liquid;Dysphagia 1 (Puree) solids   Liquid Administration via: Cup;Straw   Medication Administration: Whole meds with puree   Supervision: Patient able to self feed;Staff to assist with self feeding   Compensations: Minimize environmental distractions;Slow rate;Small sips/bites;Lingual sweep for clearance of pocketing       Oral Care Recommendations: Oral care BID   Other Recommendations: Order thickener from pharmacy    Blenda Mountsouture, Jakalyn Kratky  Laurice 08/03/2017,3:55 PM

## 2017-08-03 NOTE — Progress Notes (Signed)
Nutrition Follow-up  DOCUMENTATION CODES:   Not applicable  INTERVENTION:   If unable to pass swallow eval recommend: Jevity 1.2 @ 65 ml/hr (1560 ml/day) 30 ml Prostat BID  Provides: 2072 kcal, 116 grams protein, and 1265 ml free water.   If able to pass swallow exam will supplement diet as appropriate.   NUTRITION DIAGNOSIS:   Inadequate oral intake related to inability to eat as evidenced by NPO status. Ongoing.   GOAL:   Patient will meet greater than or equal to 90% of their needs Not met.   MONITOR:   TF tolerance, I & O's  ASSESSMENT:   42 year old with a past history of hypertension admitted with left large MCA infarct status post TPA with unsuccessful IR attempt causing left SAH.  1/17 extubated Remains in ICU on 3% hypertonic saline and NPO  MBS planned for today  Medications reviewed and include: 3% hypertonic saline Labs reviewed: Na 150 (H)  Diet Order:  Diet NPO time specified  EDUCATION NEEDS:   No education needs have been identified at this time  Skin:  Skin Assessment: Reviewed RN Assessment  Last BM:  1/16  Height:   Ht Readings from Last 1 Encounters:  07/28/17 '5\' 10"'  (1.778 m)    Weight:   Wt Readings from Last 1 Encounters:  08/02/17 188 lb 15 oz (85.7 kg)    Ideal Body Weight:  75.5 kg  BMI:  Body mass index is 27.11 kg/m.  Estimated Nutritional Needs:   Kcal:  2000-2200  Protein:  100-115 grams  Fluid:  > 2 L  [place], LDN, CNSC [phone] Pager [phone] After Hours Pager

## 2017-08-03 NOTE — Progress Notes (Signed)
  Speech Language Pathology Treatment: Dysphagia  Patient Details Name: Jeremy [name] MRN: [id number] DOB: 1975/12/29 Today's Date: 08/03/2017 Time: [id number] SLP Time Calculation (min) (ACUTE ONLY): 26 min  Assessment / Plan / Recommendation Clinical Impression  F/u treatment session after MBS - pt's wife and mother present.  Reviewed MBS video with pt/family, results/recs.  Provided pt with purees and nectar-thick liquids and discussed s/s of aspiration, compensatory strategies.  Pt still with dysphonia, likely attributable to six-day intubation. Answered mother/wife's questions re: status and prognosis.  Pt tolerated nectars and pudding with only min initial cues for head control, rate and bolus size.  Anticipate rapid progress.  Please order cognitive-linguistic evaluation.  Thanks.    HPI HPI: 42 y/o male with OSA, HTN was admitted on 1/11 with abrupt onset R facial droop. He was found to have evidence of a left MCA stroke. He was given IV TP and went unsuccessful IR attempt for thrombectomy. Some small subarachnoid bleeding and extravasation post thromobectomy. CT imaging from 1/12 shows Progressive left MCA territory infarct with further involvement of the left caudate head and lentiform nucleus, Extensive nonhemorrhagic infarcts involving the left temporal tip, left frontal operculum, left parietal lobe, and medial left frontal lobe, the distal left ACA territory. Contrast extravasation compatible with perforation into the left sylvian fissure, the sulci over the convexity, and the interpeduncular notch. Taken for decompressive hemicrani 1/13. Intubated from 1/11 to 1/17.       SLP Plan  Continue with current plan of care       Recommendations  Diet recommendations: Dysphagia 1 (puree);Nectar-thick liquid Liquids provided via: Cup;Straw Medication Administration: Whole meds with puree Supervision: Patient able to self feed;Staff to assist with self feeding Compensations: Minimize  environmental distractions;Slow rate;Small sips/bites;Lingual sweep for clearance of pocketing                General recommendations: Rehab consult Oral Care Recommendations: Oral care BID Follow up Recommendations: Inpatient Rehab SLP Visit Diagnosis: Dysphagia, oropharyngeal phase (R13.12) Plan: Continue with current plan of care       GO               Jeremy [name], [place] CCC/SLP Pager [phone]  Jeremy MountsCouture, Jeremy [name] 08/03/2017, 3:57 PM

## 2017-08-03 NOTE — Consult Note (Signed)
Physical Medicine and Rehabilitation Consult Reason for Consult: Right side weakness Referring Physician: Dr.Xu   HPI: Jeremy [name] is a 42 y.o. right handed male with history of hypertension on no antihypertensive medications. Presented 07/27/2017 with right-sided weakness facial droop and slurred speech. Per chart review patient lives with spouse. One level home/third third floor apartment. Independent and working prior to admission. Cranial CT scan showed left insula and frontal operculum lucency compatible with infarction. CT Angiography head and neck showed left M1 occlusion with intermediate left MCA distribution collateralization. Underwent mechanical thrombectomy per interventional radiology was unsuccessful and required intubation . Follow-up MRI showed cerebral edema with midline shift maintained on 3% saline. Underwent left decompressive hemicraniectomy placement of bone flap abdominal pocket 07/29/2017 per Dr. [name]. Remains on Cleviprex. NPO with tube feeds for nutritional support. She was extubated 06/02/2018 Follow-up CT 08/03/2017 stable no new changes. Subcutaneous Lovenox was later initiated for DVT prophylaxis. Physical therapy evaluation completed with recommendations of physical medicine rehabilitation consult  Review of Systems  Unable to perform ROS: Acuity of condition   Past Medical History:  Diagnosis Date  . Anxiety   . Asthma    MILD AS A CHILD  . Depression   . Deviated septum   . Dyspnea    NORMAL SPIROMETRY 02/01/17 VISIT WITH DR HEDRICK  . Hypertension   . Sleep apnea    Past Surgical History:  Procedure Laterality Date  . CRANIOTOMY Left 07/29/2017   Procedure: LEFT HEMI- CRANIECTOMY WITH PLACEMENT OF BONE FLAP IN ABDOMEN;  Surgeon: Lisbeth Renshaw, MD;  Location: [facility] OR;  Service: Neurosurgery;  Laterality: Left;  . IR ANGIO INTRA EXTRACRAN SEL COM CAROTID INNOMINATE UNI R MOD SED  07/28/2017  . IR ANGIO VERTEBRAL SEL VERTEBRAL UNI R MOD SED   07/28/2017  . IR PERCUTANEOUS ART THROMBECTOMY/INFUSION INTRACRANIAL INC DIAG ANGIO  07/28/2017  . NASAL SEPTOPLASTY W/ TURBINOPLASTY Bilateral 03/06/2017   Procedure: NASAL SEPTOPLASTY WITH TURBINATE REDUCTION;  Surgeon: Linus Salmons, MD;  Location: [facility] ORS;  Service: ENT;  Laterality: Bilateral;  . RADIOLOGY WITH ANESTHESIA N/A 07/27/2017   Procedure: RADIOLOGY WITH ANESTHESIA;  Surgeon: Julieanne Cotton, MD;  Location: [facility] OR;  Service: Radiology;  Laterality: N/A;   History reviewed. No pertinent family history. Social History:  reports that  has never smoked. he has never used smokeless tobacco. He reports that he drinks alcohol. He reports that he does not use drugs. Allergies:  Allergies  Allergen Reactions  . Blood-Group Specific Substance     PT [facility] DOES NOT ACCEPT BLOOD PRODUCTS   Medications Prior to Admission  Medication Sig Dispense Refill  . spironolactone (ALDACTONE) 100 MG tablet Take 100 mg by mouth daily.    Marland Kitchen amLODipine (NORVASC) 10 MG tablet Take 10 mg by mouth at bedtime.  3  . KLOR-CON M20 20 MEQ tablet Take 40 mEq by mouth daily.  11  . oxyCODONE-acetaminophen (ROXICET) 5-325 MG tablet Take 1-2 tablets by mouth every 4 (four) hours as needed for severe pain. 40 tablet 0  . sulfamethoxazole-trimethoprim (BACTRIM DS,SEPTRA DS) 800-160 MG tablet Take 1 tablet by mouth 2 (two) times daily. (Patient not taking: Reported on 07/28/2017) 20 tablet 0  . triamterene-hydrochlorothiazide (MAXZIDE-25) 37.5-25 MG tablet Take 1 tablet by mouth daily.  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 Max assist to bring legs off of bed and elevate trunk Transfers Overall transfer level: Needs assistance Transfers: Sit to/from Stand, Squat Pivot Transfers Sit to Stand: Mod assist, +2 physical assistance Squat pivot transfers: Mod assist General transfer comment: mod +2 to stand from elevated surface with right knee blocked. Pt able to stand 2 min with bil UE support. Pt maintaining weight on LLE with knee blocked and assist able to shift weight to right without active quad activation on RLE. Marland Kitchen Squat pivot to left with cues for sequence and mod assist to direct trunk and pelvis with over the back technique Ambulation/Gait General Gait Details: unable    ADL:    Cognition: Cognition Overall Cognitive Status: Impaired/Different from baseline Orientation Level: Oriented X4 Cognition Arousal/Alertness: Awake/alert Behavior During Therapy: WFL for tasks assessed/performed Overall Cognitive Status: Impaired/Different from baseline Area of Impairment: Attention, Safety/judgement, Problem solving, Following commands Following Commands: Follows one step commands consistently, Follows one step commands with increased time Safety/Judgement: Decreased awareness of safety, Decreased awareness of deficits Problem Solving: Slow processing, Decreased initiation, Difficulty sequencing, Requires verbal cues, Requires tactile cues  Blood pressure (!) 145/97, pulse 74, temperature 99.1 F (37.3  C), temperature source Oral, resp. rate 19, height 5\' 10"  (1.778 m), weight 85.7 kg (188 lb 15 oz), SpO2 98 %. Physical Exam  Vitals reviewed. HENT:  Craniotomy site clean and dry  Eyes:  Pupils reactive to light  Neck: Normal range of motion. Neck supple. No thyromegaly present.  Cardiovascular: Normal rate and regular rhythm.  Respiratory: Effort normal and breath sounds normal. No respiratory distress.  GI: Soft. Bowel sounds are normal. He exhibits no distension.  Neurological:  Patient with left gaze preference. He was essentially nonverbal but would state his name.  He is very alert.  Right upper extremity and right lower extremity 0 out of 5 proximal to distal.  Does seem to sense gross pain.  Right central 7 and tongue deviation.  Also difficulty AB duct and right eye.    Skin: Skin is warm and dry.    Results for orders placed or performed during the hospital encounter of 07/27/17 (from the past 24 hour(s))  Glucose, capillary     Status: Abnormal   Collection Time: 08/02/17 11:10 AM  Result Value Ref Range   Glucose-Capillary 123 (H) 65 - 99 mg/dL  Sodium     Status: Abnormal   Collection Time: 08/02/17 12:00 PM  Result Value Ref Range   Sodium 151 (H) 135 - 145 mmol/L  Glucose, capillary     Status: Abnormal   Collection Time: 08/02/17  3:42 PM  Result Value Ref Range   Glucose-Capillary 111 (H) 65 - 99 mg/dL  Sodium     Status: Abnormal   Collection Time: 08/02/17  6:16 PM  Result Value Ref Range   Sodium 153 (H) 135 - 145 mmol/L  Glucose, capillary  Status: None   Collection Time: 08/02/17  8:03 PM  Result Value Ref Range   Glucose-Capillary 84 65 - 99 mg/dL  Sodium     Status: Abnormal   Collection Time: 08/02/17 10:59 PM  Result Value Ref Range   Sodium 152 (H) 135 - 145 mmol/L  Glucose, capillary     Status: Abnormal   Collection Time: 08/03/17 12:17 AM  Result Value Ref Range   Glucose-Capillary 104 (H) 65 - 99 mg/dL  Glucose, capillary     Status:  Abnormal   Collection Time: 08/03/17  4:07 AM  Result Value Ref Range   Glucose-Capillary 101 (H) 65 - 99 mg/dL  Basic metabolic panel     Status: Abnormal   Collection Time: 08/03/17  5:03 AM  Result Value Ref Range   Sodium 150 (H) 135 - 145 mmol/L   Potassium 3.6 3.5 - 5.1 mmol/L   Chloride 114 (H) 101 - 111 mmol/L   CO2 25 22 - 32 mmol/L   Glucose, Bld 116 (H) 65 - 99 mg/dL   BUN 22 (H) 6 - 20 mg/dL   Creatinine, Ser 1.610.84 0.61 - 1.24 mg/dL   Calcium 8.5 (L) 8.9 - 10.3 mg/dL   GFR calc non Af Amer >60 >60 mL/min   GFR calc Af Amer >60 >60 mL/min   Anion gap 11 5 - 15  Magnesium     Status: None   Collection Time: 08/03/17  5:03 AM  Result Value Ref Range   Magnesium 2.3 1.7 - 2.4 mg/dL  Phosphorus     Status: None   Collection Time: 08/03/17  5:03 AM  Result Value Ref Range   Phosphorus 3.2 2.5 - 4.6 mg/dL  Glucose, capillary     Status: Abnormal   Collection Time: 08/03/17  9:39 AM  Result Value Ref Range   Glucose-Capillary 121 (H) 65 - 99 mg/dL   Ct Head Wo Contrast  Result Date: 08/02/2017 CLINICAL DATA:  42 y/o  M; left MCA and ACA infarcts for follow-up. EXAM: CT HEAD WITHOUT CONTRAST TECHNIQUE: Contiguous axial images were obtained from the base of the skull through the vertex without intravenous contrast. COMPARISON:  07/31/2016 CT head FINDINGS: Brain: Stable distribution of left MCA and ACA infarctions. Stable edema with herniation of the left cerebral convexity with large craniectomy defect, effacement of left lateral ventricle, left uncal herniation, and 4 mm left-to-right midline shift. No downward herniation identified. Stable right ventricle size. No new stroke. No interval hemorrhage. Vascular: Hyperdensity within the left M1 and distal vessels likely representing thrombus. Skull: Postsurgical changes related to left hemi craniectomy stable edema in the overlying scalp and skin staples. Sinuses/Orbits: Sphenoid sinus opacification with fluid levels, partial  opacification of left mastoid air cells, and left maxillary sinus mucosal thickening, probably due to intubation. Orbits are unremarkable. Other: None. IMPRESSION: 1. Stable distribution of left MCA and ACA infarctions. No new infarction or hemorrhage. 2. Stable mass effect with 4 mm left-to-right midline shift and mild herniation via craniectomy. Electronically Signed   By: Mitzi HansenLance  Furusawa-Stratton M.D.   On: 08/02/2017 05:06   Dg Chest Port 1 View  Result Date: 08/03/2017 CLINICAL DATA:  Respiratory failure. EXAM: PORTABLE CHEST 1 VIEW COMPARISON:  Radiograph of August 01, 2017. FINDINGS: The heart size and mediastinal contours are within normal limits. No pneumothorax or pleural effusion is noted. Endotracheal and nasogastric tubes have been removed. Right subclavian catheter is unchanged in position. Both lungs are clear. The visualized skeletal structures are unremarkable.  IMPRESSION: No acute cardiopulmonary abnormality seen. Electronically Signed   By: Lupita Raider, M.D.   On: 08/03/2017 09:39    Assessment/Plan: Diagnosis: Dense right hemiparesis and aphasia due to left MCA infarct 1. Does the need for close, 24 hr/day medical supervision in concert with the patient's rehab needs make it unreasonable for this patient to be served in a less intensive setting? Yes 2. Co-Morbidities requiring supervision/potential complications: Wound care, pain management, post stroke sequelae 3. Due to bladder management, bowel management, safety, skin/wound care, disease management, medication administration, pain management and patient education, does the patient require 24 hr/day rehab nursing? Yes 4. Does the patient require coordinated care of a physician, rehab nurse, PT (1-2 hrs/day, 5 days/week), OT (1-2 hrs/day, 5 days/week) and SLP (1-2 hrs/day, 5 days/week) to address physical and functional deficits in the context of the above medical diagnosis(es)? Yes Addressing deficits in the following areas:  balance, endurance, locomotion, strength, transferring, bowel/bladder control, bathing, dressing, feeding, grooming, toileting, cognition, speech, language, swallowing and psychosocial support 5. Can the patient actively participate in an intensive therapy program of at least 3 hrs of therapy per day at least 5 days per week? Yes 6. The potential for patient to make measurable gains while on inpatient rehab is excellent 7. Anticipated functional outcomes upon discharge from inpatient rehab are supervision and min assist  with PT, supervision and min assist with OT, supervision and min assist with SLP. 8. Estimated rehab length of stay to reach the above functional goals is: 24-30 days 9. Anticipated D/C setting: Home 10. Anticipated post D/C treatments: HH therapy and Outpatient therapy 11. Overall Rehab/Functional Prognosis: excellent  RECOMMENDATIONS: This patient's condition is appropriate for continued rehabilitative care in the following setting: CIR Patient has agreed to participate in recommended program. Yes Note that insurance prior authorization may be required for reimbursement for recommended care.  Comment: Rehab Admissions Coordinator to follow up.  Thanks,  Ranelle Oyster, MD, [place] [facility]    Mcarthur Rossetti Angiulli, PA-C 08/03/2017

## 2017-08-03 NOTE — Plan of Care (Signed)
Pt a and O x 4.  F/c on left.  RA; NSR; VSS; Passed MBS and on Dysphasia 1 diet and tolerating.  PT worked with pt today and pt oob to chair.  Tolerated well.  IP rehab screening done today.  Awaiting TEE on Monday.

## 2017-08-03 NOTE — Progress Notes (Signed)
STROKE TEAM PROGRESS NOTE   SUBJECTIVE (INTERVAL HISTORY) Mom is at the bedside.  Pt extubate yesterday and tolerating well. Sitting in chair this am. Will d/c foley catheter. Pending TEE next week. PT/OT recommend CIR. Will keep 3% saline today and will taper off tomorrow.    Past Medical History:  Diagnosis Date  . Anxiety   . Asthma    MILD AS A CHILD  . Depression   . Deviated septum   . Dyspnea    NORMAL SPIROMETRY 02/01/17 VISIT WITH DR [place]  . Hypertension   . Sleep apnea    FAMILY HISTORY History reviewed. No pertinent family history.  SOCIAL HISTORY  reports that  has never smoked. he has never used smokeless tobacco. He reports that he drinks alcohol. He reports that he does not use drugs.   HOME MEDICATIONS:  Current Meds  Medication Sig  . spironolactone (ALDACTONE) 100 MG tablet Take 100 mg by mouth daily.      HOSPITAL MEDICATIONS:  . aspirin  325 mg Per Tube Daily  . atorvastatin  40 mg Oral q1800  . Chlorhexidine Gluconate Cloth  6 each Topical Daily  . cyanocobalamin  1,000 mcg Intramuscular Daily  . enoxaparin (LOVENOX) injection  40 mg Subcutaneous Q24H  . ezetimibe  10 mg Oral Daily  . feeding supplement (PRO-STAT SUGAR FREE 64)  30 mL Per Tube Daily  . insulin aspart  1-3 Units Subcutaneous Q4H  . pantoprazole sodium  40 mg Per Tube QHS  . sodium chloride flush  10-40 mL Intracatheter Q12H    OBJECTIVE Temp:  [98.5 F (36.9 C)-99.1 F (37.3 C)] 99.1 F (37.3 C) (01/18 0400) Pulse Rate:  [64-106] 74 (01/18 0700) Cardiac Rhythm: Normal sinus rhythm (01/18 0400) Resp:  [16-30] 19 (01/18 0700) BP: (130-158)/(78-110) 136/96 (01/18 0700) SpO2:  [96 %-100 %] 98 % (01/18 0700) FiO2 (%):  [30 %-40 %] 30 % (01/17 1000)  CBC:  Recent Labs  Lab 07/27/17 2055  07/28/17 0259  08/01/17 0433 08/02/17 0523  WBC 6.6  --  8.0   < > 11.2* 9.9  NEUTROABS 3.2  --  6.3  --   --   --   HGB 16.1   < > 13.2   < > 9.8* 9.8*  HCT 45.5   < > 37.9*   < >  30.2* 30.4*  MCV 88.0  --  87.5   < > 92.4 92.7  PLT 228  --  203   < > 235 292   < > = values in this interval not displayed.    Basic Metabolic Panel:  Recent Labs  Lab 08/01/17 0433  08/02/17 0523  08/02/17 2259 08/03/17 0503  NA 152*   < > 153*   < > 152* 150*  K 3.4*  --  3.5  --   --  3.6  CL 119*  --  118*  --   --  114*  CO2 25  --  26  --   --  25  GLUCOSE 157*  --  131*  --   --  116*  BUN 17  --  21*  --   --  22*  CREATININE 0.78  --  0.84  --   --  0.84  CALCIUM 8.2*  --  8.1*  --   --  8.5*  MG 2.3  --   --   --   --  2.3  PHOS 1.8*  --   --   --   --  3.2   < > = values in this interval not displayed.    Lipid Panel:     Component Value Date/Time   CHOL 218 (H) 07/28/2017 0259   TRIG 316 (H) 07/30/2017 2323   HDL 28 (L) 07/28/2017 0259   CHOLHDL 7.8 07/28/2017 0259   VLDL UNABLE TO CALCULATE IF TRIGLYCERIDE OVER 400 mg/dL 07/28/2017 0259   LDLCALC UNABLE TO CALCULATE IF TRIGLYCERIDE OVER 400 mg/dL 07/28/2017 0259   HgbA1c:  Lab Results  Component Value Date   HGBA1C 5.2 07/28/2017   Urine Drug Screen:     Component Value Date/Time   LABOPIA NONE DETECTED 07/28/2017 0415   COCAINSCRNUR NONE DETECTED 07/28/2017 0415   LABBENZ NONE DETECTED 07/28/2017 0415   AMPHETMU NONE DETECTED 07/28/2017 0415   [place] DETECTED 07/28/2017 0415   LABBARB NONE DETECTED 07/28/2017 0415    Alcohol Level No results found for: [place] I have personally reviewed the radiological images below and agree with the radiology interpretations.  Ct Angio Head W Or Wo Contrast  Addendum Date: 07/27/2017   ADDENDUM REPORT: 07/27/2017 21:47 CONTRAST:  Total of 200 cc Omnipaque 370 administered. Electronically Signed   By: Kristine Garbe M.D.   On: 07/27/2017 21:47   Result Date: 07/27/2017 CLINICAL DATA:  42 y/o  M; right-sided numbness, acute stroke. EXAM: CT ANGIOGRAPHY HEAD AND NECK CT PERFUSION BRAIN TECHNIQUE: Multidetector CT imaging of the head and  neck was performed using the standard protocol during bolus administration of intravenous contrast. Multiplanar CT image reconstructions and MIPs were obtained to evaluate the vascular anatomy. Carotid stenosis measurements (when applicable) are obtained utilizing NASCET criteria, using the distal internal carotid diameter as the denominator. Multiphase CT imaging of the brain was performed following IV bolus contrast injection. Subsequent parametric perfusion maps were calculated using RAPID software. CONTRAST:  See addendum. COMPARISON:  07/27/2016 CT head FINDINGS: CTA NECK FINDINGS Aortic arch: Standard branching. Imaged portion shows no evidence of aneurysm or dissection. No significant stenosis of the major arch vessel origins. Right carotid system: No evidence of dissection, stenosis (50% or greater) or occlusion. Left carotid system: No evidence of dissection, stenosis (50% or greater) or occlusion. Vertebral arteries: Right dominant. No evidence of dissection, stenosis (50% or greater) or occlusion. Skeleton: Negative. Other neck: Nodules in bilateral lobes of thyroid measuring up to 2.6 cm on the right (series 7 image 149). Upper chest: Negative. Review of the MIP images confirms the above findings CTA HEAD FINDINGS Anterior circulation: Left proximal M1 occlusion with intermediate left MCA distribution collateralization. Otherwise no significant stenosis, proximal occlusion, aneurysm, or vascular malformation in the anterior circulation. Posterior circulation: No significant stenosis, proximal occlusion, aneurysm, or vascular malformation. Venous sinuses: As permitted by contrast timing, patent. Anatomic variants: Bilateral fetal PCA. Small anterior communicating artery. Review of the MIP images confirms the above findings CT Brain Perfusion Findings: CBF (<30%) Volume: 75m Perfusion (Tmax>6.0s) volume: 1540mMismatch Volume: 7536mnfarction Location:Left MCA distribution IMPRESSION: CTA head: 1. Left M1  occlusion with intermediate left MCA distribution collateralization. 2. Otherwise patent anterior and posterior intracranial circulation. No additional large vessel occlusion, aneurysm, or significant stenosis is identified. CTA neck: 1. Patent carotid and vertebral arteries. No dissection, aneurysm, or significant stenosis by NASCET criteria. 2. Nodules in bilateral lobes of thyroid measuring up to 2.6 cm on the right. Thyroid ultrasound is recommended on a nonemergent basis. CT perfusion head: By automated RAPID quantification: Infarct core 82 cc, ischemic penumbra 75 cc, infarct location in left  MCA distribution. These results were called by telephone at the time of interpretation on 07/27/2017 at 9:23 pm to Dr. Amie Portland , who verbally acknowledged these results. Electronically Signed: By: Kristine Garbe M.D. On: 07/27/2017 21:39   Ct Head Wo Contrast  Result Date: 07/28/2017 CLINICAL DATA:  Follow-up suspected LEFT middle cerebral artery extravasation during incomplete thrombectomy. EXAM: CT HEAD WITHOUT CONTRAST TECHNIQUE: Contiguous axial images were obtained from the base of the skull through the vertex without intravenous contrast. COMPARISON:  Multiple CT HEAD May 27, 2018 FINDINGS: BRAIN: Increased density LEFT MCA distribution extending into LEFT insula and LEFT frontoparietal sulci measuring to 91 Hounsfield units. Faint density interpeduncular cistern measuring 50 Hounsfield units. Confluent LEFT frontotemporal parietal hypodensity extending to LEFT basal ganglia. No convincing evidence of intraparenchymal hemorrhage. Regional LEFT cerebrum mass effect without midline shift. No hydrocephalus. Basal cisterns are patent. 1 cm cyst in caudal aspect splenium of corpus callosum versus pineal cyst, less likely. VASCULAR: Dense LEFT MCA (55 Hounsfield units). New density along anterior cerebral artery distribution. SKULL/SOFT TISSUES: No skull fracture. No significant soft tissue  swelling. ORBITS/SINUSES: The included ocular globes and orbital contents are normal.Mild paranasal sinusitis. Life-support lines in place. Mastoid air cells are well aerated. OTHER: None. IMPRESSION: 1. Evolving acute large LEFT MCA territory infarct without hemorrhagic conversion. 2. Postprocedural extra-axial contrast extravasation in addition to probable contrast staining and small volume subarachnoid hemorrhage. 3. New density anterior cerebral artery concerning for thromboembolism. Critical Value/emergent results were called by telephone at the time of interpretation on 07/28/2017 at 4:00 am to Dr. Amie Portland , who verbally acknowledged these results. Electronically Signed   By: Elon Alas M.D.   On: 07/28/2017 04:16   Ct Angio Neck W Or Wo Contrast  Addendum Date: 07/27/2017   ADDENDUM REPORT: 07/27/2017 21:47 CONTRAST:  Total of 200 cc Omnipaque 370 administered. Electronically Signed   By: Kristine Garbe M.D.   On: 07/27/2017 21:47   Result Date: 07/27/2017 CLINICAL DATA:  42 y/o  M; right-sided numbness, acute stroke. EXAM: CT ANGIOGRAPHY HEAD AND NECK CT PERFUSION BRAIN TECHNIQUE: Multidetector CT imaging of the head and neck was performed using the standard protocol during bolus administration of intravenous contrast. Multiplanar CT image reconstructions and MIPs were obtained to evaluate the vascular anatomy. Carotid stenosis measurements (when applicable) are obtained utilizing NASCET criteria, using the distal internal carotid diameter as the denominator. Multiphase CT imaging of the brain was performed following IV bolus contrast injection. Subsequent parametric perfusion maps were calculated using RAPID software. CONTRAST:  See addendum. COMPARISON:  07/27/2016 CT head FINDINGS: CTA NECK FINDINGS Aortic arch: Standard branching. Imaged portion shows no evidence of aneurysm or dissection. No significant stenosis of the major arch vessel origins. Right carotid system: No  evidence of dissection, stenosis (50% or greater) or occlusion. Left carotid system: No evidence of dissection, stenosis (50% or greater) or occlusion. Vertebral arteries: Right dominant. No evidence of dissection, stenosis (50% or greater) or occlusion. Skeleton: Negative. Other neck: Nodules in bilateral lobes of thyroid measuring up to 2.6 cm on the right (series 7 image 149). Upper chest: Negative. Review of the MIP images confirms the above findings CTA HEAD FINDINGS Anterior circulation: Left proximal M1 occlusion with intermediate left MCA distribution collateralization. Otherwise no significant stenosis, proximal occlusion, aneurysm, or vascular malformation in the anterior circulation. Posterior circulation: No significant stenosis, proximal occlusion, aneurysm, or vascular malformation. Venous sinuses: As permitted by contrast timing, patent. Anatomic variants: Bilateral fetal PCA. Small anterior communicating  artery. Review of the MIP images confirms the above findings CT Brain Perfusion Findings: CBF (<30%) Volume: 84m Perfusion (Tmax>6.0s) volume: 1510mMismatch Volume: 7566mnfarction Location:Left MCA distribution IMPRESSION: CTA head: 1. Left M1 occlusion with intermediate left MCA distribution collateralization. 2. Otherwise patent anterior and posterior intracranial circulation. No additional large vessel occlusion, aneurysm, or significant stenosis is identified. CTA neck: 1. Patent carotid and vertebral arteries. No dissection, aneurysm, or significant stenosis by NASCET criteria. 2. Nodules in bilateral lobes of thyroid measuring up to 2.6 cm on the right. Thyroid ultrasound is recommended on a nonemergent basis. CT perfusion head: By automated RAPID quantification: Infarct core 82 cc, ischemic penumbra 75 cc, infarct location in left MCA distribution. These results were called by telephone at the time of interpretation on 07/27/2017 at 9:23 pm to Dr. ASHAmie Portlandwho verbally acknowledged  these results. Electronically Signed: By: LanKristine GarbeD. On: 07/27/2017 21:39   Ct Cerebral Perfusion W Contrast  Addendum Date: 07/27/2017   ADDENDUM REPORT: 07/27/2017 21:47 CONTRAST:  Total of 200 cc Omnipaque 370 administered. Electronically Signed   By: LanKristine GarbeD.   On: 07/27/2017 21:47   Result Date: 07/27/2017 CLINICAL DATA:  [age]o  M; right-sided numbness, acute stroke. EXAM: CT ANGIOGRAPHY HEAD AND NECK CT PERFUSION BRAIN TECHNIQUE: Multidetector CT imaging of the head and neck was performed using the standard protocol during bolus administration of intravenous contrast. Multiplanar CT image reconstructions and MIPs were obtained to evaluate the vascular anatomy. Carotid stenosis measurements (when applicable) are obtained utilizing NASCET criteria, using the distal internal carotid diameter as the denominator. Multiphase CT imaging of the brain was performed following IV bolus contrast injection. Subsequent parametric perfusion maps were calculated using RAPID software. CONTRAST:  See addendum. COMPARISON:  07/27/2016 CT head FINDINGS: CTA NECK FINDINGS Aortic arch: Standard branching. Imaged portion shows no evidence of aneurysm or dissection. No significant stenosis of the major arch vessel origins. Right carotid system: No evidence of dissection, stenosis (50% or greater) or occlusion. Left carotid system: No evidence of dissection, stenosis (50% or greater) or occlusion. Vertebral arteries: Right dominant. No evidence of dissection, stenosis (50% or greater) or occlusion. Skeleton: Negative. Other neck: Nodules in bilateral lobes of thyroid measuring up to 2.6 cm on the right (series 7 image 149). Upper chest: Negative. Review of the MIP images confirms the above findings CTA HEAD FINDINGS Anterior circulation: Left proximal M1 occlusion with intermediate left MCA distribution collateralization. Otherwise no significant stenosis, proximal occlusion, aneurysm, or  vascular malformation in the anterior circulation. Posterior circulation: No significant stenosis, proximal occlusion, aneurysm, or vascular malformation. Venous sinuses: As permitted by contrast timing, patent. Anatomic variants: Bilateral fetal PCA. Small anterior communicating artery. Review of the MIP images confirms the above findings CT Brain Perfusion Findings: CBF (<30%) Volume: 33m36mrfusion (Tmax>6.0s) volume: 157mL5mmatch Volume: 75mL 75mrction Location:Left MCA distribution IMPRESSION: CTA head: 1. Left M1 occlusion with intermediate left MCA distribution collateralization. 2. Otherwise patent anterior and posterior intracranial circulation. No additional large vessel occlusion, aneurysm, or significant stenosis is identified. CTA neck: 1. Patent carotid and vertebral arteries. No dissection, aneurysm, or significant stenosis by NASCET criteria. 2. Nodules in bilateral lobes of thyroid measuring up to 2.6 cm on the right. Thyroid ultrasound is recommended on a nonemergent basis. CT perfusion head: By automated RAPID quantification: Infarct core 82 cc, ischemic penumbra 75 cc, infarct location in left MCA distribution. These results were called by telephone at the time of interpretation on 07/27/2017  at 9:23 pm to Dr. Amie [place] , who verbally acknowledged these results. Electronically Signed: By: Kristine Garbe M.D. On: 07/27/2017 21:39   Dg Chest Port 1 View  Result Date: 07/28/2017 CLINICAL DATA:  42 year old male status post intubation. EXAM: PORTABLE CHEST 1 VIEW COMPARISON:  None. FINDINGS: An enteric tube is seen with side-port in the region of the gastroesophageal junction and tip in the proximal stomach. Recommend further advancing of the tube into the stomach by approximately 3-4 cm. A partially visualized structure with tip at the level of the T1 may represent the endotracheal tube. Clinical correlation is recommended. If an endotracheal tube is present recommend further  advancing the tube by approximately 3 cm for optimal positioning. The lungs are clear. There is no pleural effusion or pneumothorax. The cardiac silhouette is within normal limits. No acute osseous pathology. IMPRESSION: 1. Enteric tube with tip in the stomach and side-port in the region of the gastroesophageal junction. Recommend advancing the tube further into the stomach for optimal positioning. A partially visualized density over the T1 may represent the tip of the endotracheal tube. If an endotracheal tube is present recommend further advancing by approximately 3 cm for optimal positioning. 2. No acute cardiopulmonary process. Electronically Signed   By: Anner Crete M.D.   On: 07/28/2017 01:18   Ct Head Code Stroke Wo Contrast  Result Date: 07/27/2017 CLINICAL DATA:  Code stroke. 42 y/o M; right-sided weakness and facial droop. EXAM: CT HEAD WITHOUT CONTRAST TECHNIQUE: Contiguous axial images were obtained from the base of the skull through the vertex without intravenous contrast. COMPARISON:  None. FINDINGS: Brain: Faint lucency involving left insula and frontal operculum. No significant mass effect or hemorrhage at this time. No hydrocephalus, basilar cistern effacement, or extra-axial collection. Vascular: Dense left M1. Skull: Normal. Negative for fracture or focal lesion. Sinuses/Orbits: No acute finding. Other: None. ASPECTS [facility] Stroke Program Early CT Score) - Ganglionic level infarction (caudate, lentiform nuclei, internal capsule, insula, M1-M3 cortex): 5 - Supraganglionic infarction (M4-M6 cortex): 3 Total score (0-10 with 10 being normal): 8 IMPRESSION: 1. Left insula and frontal operculum lucency compatible with infarction. No hemorrhage or mass effect. 2. Left M1 hyperdensity, likely thrombus. 3. ASPECTS is 8 These results were called by telephone at the time of interpretation on 07/27/2017 at 9:10 pm to Dr. Rory Percy , who verbally acknowledged these results. Electronically Signed   By:  Kristine Garbe M.D.   On: 07/27/2017 21:12   CT head 05/28/18 10am 1. Progressive left MCA territory infarct with further involvement of the left caudate head and lentiform nucleus. 2. Extensive nonhemorrhagic infarcts involving the left temporal tip, left frontal operculum, left parietal lobe, and medial left frontal lobe, the distal left ACA territory. 3. Contrast extravasation compatible with perforation into the left sylvian fissure, the sulci over the convexity, and the interpeduncular notch. 4. Minimal midline shift. 5. Fluid in the sinuses likely secondary to intubation.  Transthoracic Echocardiogram - Left ventricle: The cavity size was normal. Wall thickness was   increased in a pattern of mild LVH. Systolic function was normal.   The estimated ejection fraction was in the range of 60% to 65%.   Wall motion was normal; there were no regional wall motion   abnormalities. Left ventricular diastolic function parameters   were normal. - Atrial septum: No defect or patent foramen ovale was identified.  Bilateral LE venous Dopplers - No evidence of deep vein thrombosis or baker's cysts bilaterally.   Ct Head Wo Contrast  Result Date: 07/29/2017 CLINICAL DATA:  Stroke. EXAM: CT HEAD WITHOUT CONTRAST TECHNIQUE: Contiguous axial images were obtained from the base of the skull through the vertex without intravenous contrast. COMPARISON:  MRI brain/12/19.  CT head 07/28/2017. FINDINGS: Brain: A large left MCA and ACA territory infarct is again noted. There is diffuse hypoattenuation involving affected areas of the left frontal and parietal lobe. The superior and anterior temporal lobe is involved. Contrast and hemorrhage within the sylvian fissure and along the left MCA is again noted. No new hemorrhage is present. There is increasing mass effect with effacement of the left lateral ventricle and now 10 mm of midline shift. There is increasing prominence of the right temporal horn  compatible with mass effect on the lateral ventricles. Effacement of the para mesencephalic cistern on the right suggest developing uncal herniation. Brainstem and cerebellum are unremarkable. Vascular: Hyperdense left MCA and branches compatible with contrast and thrombus. Skull: Calvarium is intact. No focal lytic or blastic lesions are present. Sinuses/Orbits: Increased fluid is present in the left sphenoid sinus. There is mild mucosal thickening of the left maxillary sinus. Globes and orbits are within normal limits. IMPRESSION: 1. Evolving left MCA and ACA territory infarct increasing mass effect and midline shift, now measuring 10 mm. 2. Mild increasing dilation of the right temporal tip compatible with left-sided effacement. 3. Developing uncal herniation without significant downward herniation of the brainstem. 4. Subarachnoid contrast and hemorrhage again noted within the sylvian fissure. Electronically Signed   By: San Morelle M.D.   On: 07/29/2017 06:40   Mr Brain Wo Contrast  Result Date: 07/28/2017 CLINICAL DATA:  Follow-up LEFT-sided stroke. Status post tPA July 27, 2017. EXAM: MRI HEAD WITHOUT CONTRAST TECHNIQUE: Multiplanar, multiecho pulse sequences of the brain and surrounding structures were obtained without intravenous contrast. COMPARISON:  CT HEAD July 28, 2017 at 0852 hours FINDINGS: BRAIN: Confluent LEFT frontotemporal parietal and LEFT basal ganglia reduced diffusion with involvement of the mesial LEFT parietal and frontal lobes. Low ADC values. Susceptibility artifact within LEFT sylvian fissure and LEFT frontal parietal sulci. No intraparenchymal hemorrhage. Worsening 8 mm LEFT-to-RIGHT midline shift, increased from 2 mm. Partially effaced LEFT lateral ventricle with early RIGHT lateral ventricle entrapment. 11 mm T2 bright simple cyst within or adjacent to splenium of corpus callosum. No significant extra-axial fluid accumulation. VASCULAR: Normal major intracranial  vascular flow voids present at skull base. SKULL AND UPPER CERVICAL SPINE: No abnormal sellar expansion. No suspicious calvarial bone marrow signal. Craniocervical junction maintained. SINUSES/ORBITS: Secretions layering in scratches at layering secretions in paranasal sinuses. Life-support lines in place. Mastoid air cells are well aerated. The included ocular globes and orbital contents are non-suspicious. OTHER: None. IMPRESSION: 1. Evolving acute large LEFT MCA territory nonhemorrhagic infarct. Evolving acute distal LEFT ACA territory nonhemorrhagic infarct. 2. Worsening 8 mm LEFT-to-RIGHT midline shift with early RIGHT ventricle entrapment. 3. Susceptibility artifact LEFT sylvian fissure and LEFT sulci corresponding to known extra-axial hemorrhage. Acute findings text paged to [place] via AMION secure system on 07/28/2017 at 10:37 pm. Electronically Signed   By: Elon Alas M.D.   On: 07/28/2017 22:38   Ct Head Wo Contrast  Result Date: 07/31/2017 CLINICAL DATA:  Stroke, follow-up craniotomy. EXAM: CT HEAD WITHOUT CONTRAST TECHNIQUE: Contiguous axial images were obtained from the base of the skull through the vertex without intravenous contrast. COMPARISON:  CT HEAD July 29, 2017 and MRI of the head July 28, 2017 FINDINGS: BRAIN: Mild external herniation LEFT cerebrum via craniectomy defect. Evolving large  LEFT acute to subacute MCA territory infarct without hemorrhagic conversion. Moderate LEFT frontoparietal ACA territory infarct. Regional mass effect without midline shift. Persistent partial LEFT lateral ventricle effacement. Resolving RIGHT ventricular entrapment. Mild residual LEFT uncal herniation. Patent basal cisterns. VASCULAR: Dense LEFT MCA consistent with thromboembolism and, potentially trapped extravasated contrast. Focally dense LEFT ACA. SKULL/SOFT TISSUES: Interval LEFT decompressive craniectomy. LEFT scalp soft tissue swelling, subcutaneous gas with overlying skin  staples. ORBITS/SINUSES: The included ocular globes and orbital contents are normal.Mild paranasal sinus mucosal thickening. LEFT sphenoid sinus air-fluid level. Mastoid air cells are well aerated. OTHER: Life-support lines in place. IMPRESSION: 1. Evolving large LEFT MCA and moderate LEFT ACA territory infarct without hemorrhagic conversion. 2. Interval decompressive LEFT craniectomy with mild external herniation of LEFT cerebrum. Resolution of midline shift. 3. Resolving RIGHT ventricular entrapment. Electronically Signed   By: Elon Alas M.D.   On: 07/31/2017 04:26   Ct Head Wo Contrast  Result Date: 08/02/2017 CLINICAL DATA:  42 y/o  M; left MCA and ACA infarcts for follow-up. EXAM: CT HEAD WITHOUT CONTRAST TECHNIQUE: Contiguous axial images were obtained from the base of the skull through the vertex without intravenous contrast. COMPARISON:  07/31/2016 CT head FINDINGS: Brain: Stable distribution of left MCA and ACA infarctions. Stable edema with herniation of the left cerebral convexity with large craniectomy defect, effacement of left lateral ventricle, left uncal herniation, and 4 mm left-to-right midline shift. No downward herniation identified. Stable right ventricle size. No new stroke. No interval hemorrhage. Vascular: Hyperdensity within the left M1 and distal vessels likely representing thrombus. Skull: Postsurgical changes related to left hemi craniectomy stable edema in the overlying scalp and skin staples. Sinuses/Orbits: Sphenoid sinus opacification with fluid levels, partial opacification of left mastoid air cells, and left maxillary sinus mucosal thickening, probably due to intubation. Orbits are unremarkable. Other: None. IMPRESSION: 1. Stable distribution of left MCA and ACA infarctions. No new infarction or hemorrhage. 2. Stable mass effect with 4 mm left-to-right midline shift and mild herniation via craniectomy. Electronically Signed   By: Kristine Garbe M.D.   On:  08/02/2017 05:06     PHYSICAL EXAM  Temp:  [98.5 F (36.9 C)-99.1 F (37.3 C)] 99.1 F (37.3 C) (01/18 0400) Pulse Rate:  [64-106] 74 (01/18 0700) Resp:  [16-30] 19 (01/18 0700) BP: (130-158)/(78-110) 136/96 (01/18 0700) SpO2:  [96 %-100 %] 98 % (01/18 0700) FiO2 (%):  [30 %-40 %] 30 % (01/17 1000)  General - Well nourished, well developed, intubated.  Ophthalmologic - Fundi not visualized due to eye movement.  Cardiovascular - Regular rate and rhythm.  Neuro - intubated, off sedation, awake alert and eyes open, left eyelid swelling,  PERRL, left gaze preference, barely cross midline, blinking to visual threat on the left, but not able to the left.  Able to follow all simple commands.  Facial symmetry not able to test due to ET tube.  Left upper extremity and lower extremity at least 4+/5, right upper and lower extremity dense hemiplegia.  DTR diminished on the right, no Babinski.  Sensation and coordination not cooperative.  Gait not tested.   ASSESSMENT/PLAN Mr. Jenna Ardoin is a 42 y.o. male with history of HTN, OSA presenting with right-sided weakness, left gaze and right neglect. He receive IV t-PA.  However, unsuccessful for mechanical thrombectomy with complication of left SAH.  Stroke: Left large MCA infarct status post TPA with unsuccessful IR attempt causing left SAH.  Resultant  left gaze, right hemiplegia, right neglect  CT head  left MCA  hyperdense  CT head and neck left MCA occlusion  DSA with unsuccessful IR attempt causing left SAH  CT repeat showed left MCA large infarct with left stable SAH and stable subtle midline shift  MRI head large left MCA and ACA infarct with midline shift  Repeat CT 07/31/17 - much improved midline shift after hemicrani  2D Echo - EF 60-65%  LE venous Doppler no DVT  Will set up TEE next Monday  LDL NTC due to high TG  HgbA1c - 5.2  UDS negative  VTE prophylaxis -SCDs Diet NPO time specified  No antithrombotic prior to  admission, now on ASA 338m.   Ongoing aggressive stroke risk factor management  Therapy recommendations:  CIR  Disposition:  Pending  Cerebral edema s/p decompressive hemicrani  CT head showed large left MCA infarct  MRI large left MCA and ACA infarcts with midline shift  CT repeat - increasing midline shift  CT repeat 07/31/17 - mild midline shift  Repeat CT 08/02/17 - stable midline shift  on 3% saline, @ 30cc/h now, will start taper off tomorrow  Na 144->150->153->152->153->150  Sodium goal 150 - 155  Neurosurgery on board - s/p hemi craniectomy   Fever - likely related to surgery, resolved  Afebrile for the last 24 hours  On temperature control protocol PRN  Off arctic sun  WBC 11.2->10.6->11.2->9.9  Hypertension  Stable but elevated than before  Likely related to increased ICP  Labetalol IV PRN  BP goal < 160 [facility]  Home meds: None  LDL NTC due to high TG, goal < 70  TG 417  On zetia and lipitor  Continue on discharge  B12 deficiency  B12 = 161   B12 im supplement daily x 7 days  Other Stroke Risk Factors  Obstructive sleep apnea  Other Active Problems  TSH mildly low but FT4 normal  Elevated ESR  Hospital day # 7  This patient is critically ill due to large left MCA infarct, SAH, cerebral edema and at significant risk of neurological worsening, death form brain herniation, seizure, heart failure. This patient's care requires constant monitoring of vital signs, hemodynamics, respiratory and cardiac monitoring, review of multiple databases, neurological assessment, discussion with family, other specialists and medical decision making of high complexity. I had long discussion with mom at bedside, updated pt current condition, treatment plan and potential prognosis. They expressed understanding and appreciation. I also discussed with Dr. JCarson Myrtle I spent 35 minutes of neurocritical care time in the care of this  patient.  JRosalin Hawking MD PhD Stroke Neurology 08/03/2017 1:06 PM   To contact Stroke Continuity provider, please refer to [email or web address] After hours, contact General Neurology

## 2017-08-03 NOTE — Progress Notes (Signed)
Inpatient Rehabilitation  Per PT request, patient was screened by Myreon Wimer for appropriateness for an Inpatient Acute Rehab consult.  At this time we are recommending an Inpatient Rehab consult.  Please order if you are agreeable.    Rana Hochstein, M.A., CCC/SLP Admission Coordinator  [facility]  Cell [phone]  

## 2017-08-04 ENCOUNTER — Inpatient Hospital Stay (HOSPITAL_COMMUNITY): Payer: BLUE CROSS/BLUE SHIELD

## 2017-08-04 DIAGNOSIS — G936 Cerebral edema: Secondary | ICD-10-CM

## 2017-08-04 LAB — GLUCOSE, CAPILLARY
GLUCOSE-CAPILLARY: 109 mg/dL — AB (ref 65–99)
GLUCOSE-CAPILLARY: 79 mg/dL (ref 65–99)
Glucose-Capillary: 102 mg/dL — ABNORMAL HIGH (ref 65–99)
Glucose-Capillary: 116 mg/dL — ABNORMAL HIGH (ref 65–99)
Glucose-Capillary: 87 mg/dL (ref 65–99)

## 2017-08-04 LAB — CULTURE, BLOOD (ROUTINE X 2)
CULTURE: NO GROWTH
Culture: NO GROWTH
Special Requests: ADEQUATE
Special Requests: ADEQUATE

## 2017-08-04 LAB — SODIUM
SODIUM: 148 mmol/L — AB (ref 135–145)
SODIUM: 143 mmol/L (ref 135–145)
Sodium: 141 mmol/L (ref 135–145)
Sodium: 141 mmol/L (ref 135–145)
Sodium: 147 mmol/L — ABNORMAL HIGH (ref 135–145)

## 2017-08-04 MED ORDER — ORAL CARE MOUTH RINSE
15.0000 mL | Freq: Two times a day (BID) | OROMUCOSAL | Status: DC
  Administered 2017-08-04 – 2017-08-06 (×3): 15 mL via OROMUCOSAL

## 2017-08-04 MED ORDER — PANTOPRAZOLE SODIUM 40 MG PO TBEC
40.0000 mg | DELAYED_RELEASE_TABLET | Freq: Every day | ORAL | Status: DC
  Administered 2017-08-05 – 2017-08-07 (×3): 40 mg via ORAL
  Filled 2017-08-04 (×3): qty 1

## 2017-08-04 NOTE — Plan of Care (Signed)
Pt a and o x4.  F/c on Left.  Tolerating diet.  Tolerating PT.  VSS.  BM noted today.  No c/o pain.  Starting back on PTA antidepressant.

## 2017-08-04 NOTE — Progress Notes (Signed)
STROKE TEAM PROGRESS NOTE   SUBJECTIVE (INTERVAL HISTORY) His mother is at the bedside.  Pt  is tolerating extubation well. Sitting up in bed this am.  . Will keep 3% saline today and will taper off tomorrow. After checking repeat CT scan of the head tomorrow morning    Past Medical History:  Diagnosis Date  . Anxiety   . Asthma    MILD AS A CHILD  . Depression   . Deviated septum   . Dyspnea    NORMAL SPIROMETRY 02/01/17 VISIT WITH DR [place]  . Hypertension   . Sleep apnea    FAMILY HISTORY History reviewed. No pertinent family history.  SOCIAL HISTORY  reports that  has never smoked. he has never used smokeless tobacco. He reports that he drinks alcohol. He reports that he does not use drugs.   HOME MEDICATIONS:  Current Meds  Medication Sig  . spironolactone (ALDACTONE) 100 MG tablet Take 100 mg by mouth daily.      HOSPITAL MEDICATIONS:  . aspirin  325 mg Per Tube Daily  . atorvastatin  40 mg Oral q1800  . Chlorhexidine Gluconate Cloth  6 each Topical Daily  . cyanocobalamin  1,000 mcg Intramuscular Daily  . enoxaparin (LOVENOX) injection  40 mg Subcutaneous Q24H  . ezetimibe  10 mg Oral Daily  . insulin aspart  1-3 Units Subcutaneous Q4H  . pantoprazole sodium  40 mg Per Tube QHS  . sodium chloride flush  10-40 mL Intracatheter Q12H    OBJECTIVE Temp:  [98.2 F (36.8 C)-100.2 F (37.9 C)] 98.8 F (37.1 C) (01/19 0800) Pulse Rate:  [61-99] 81 (01/19 0700) Cardiac Rhythm: Normal sinus rhythm (01/18 2000) Resp:  [15-23] 20 (01/19 0700) BP: (129-158)/(86-105) 147/105 (01/19 0700) SpO2:  [90 %-100 %] 90 % (01/19 0700) Weight:  [188 lb 0.8 oz (85.3 kg)] 188 lb 0.8 oz (85.3 kg) (01/19 0500)  CBC:  Recent Labs  Lab 08/01/17 0433 08/02/17 0523  WBC 11.2* 9.9  HGB 9.8* 9.8*  HCT 30.2* 30.4*  MCV 92.4 92.7  PLT 235 542    Basic Metabolic Panel:  Recent Labs  Lab 08/01/17 0433  08/02/17 0523  08/03/17 0503  08/03/17 1707 08/04/17 0503  NA 152*   < >  153*   < > 150*   < > 144 148*  K 3.4*  --  3.5  --  3.6  --   --   --   CL 119*  --  118*  --  114*  --   --   --   CO2 25  --  26  --  25  --   --   --   GLUCOSE 157*  --  131*  --  116*  --   --   --   BUN 17  --  21*  --  22*  --   --   --   CREATININE 0.78  --  0.84  --  0.84  --   --   --   CALCIUM 8.2*  --  8.1*  --  8.5*  --   --   --   MG 2.3  --   --   --  2.3  --   --   --   PHOS 1.8*  --   --   --  3.2  --   --   --    < > = values in this interval not displayed.    Lipid Panel:  Component Value Date/Time   CHOL 218 (H) 07/28/2017 0259   TRIG 316 (H) 07/30/2017 2323   HDL 28 (L) 07/28/2017 0259   CHOLHDL 7.8 07/28/2017 0259   VLDL UNABLE TO CALCULATE IF TRIGLYCERIDE OVER 400 mg/dL 07/28/2017 0259   LDLCALC UNABLE TO CALCULATE IF TRIGLYCERIDE OVER 400 mg/dL 07/28/2017 0259   HgbA1c:  Lab Results  Component Value Date   HGBA1C 5.2 07/28/2017   Urine Drug Screen:     Component Value Date/Time   LABOPIA NONE DETECTED 07/28/2017 0415   COCAINSCRNUR NONE DETECTED 07/28/2017 0415   LABBENZ NONE DETECTED 07/28/2017 0415   AMPHETMU NONE DETECTED 07/28/2017 0415   [place] DETECTED 07/28/2017 0415   LABBARB NONE DETECTED 07/28/2017 0415    Alcohol Level No results found for: [place] I have personally reviewed the radiological images below and agree with the radiology interpretations.  Ct Angio Head W Or Wo Contrast  Addendum Date: 07/27/2017   ADDENDUM REPORT: 07/27/2017 21:47 CONTRAST:  Total of 200 cc Omnipaque 370 administered. Electronically Signed   By: Kristine Garbe M.D.   On: 07/27/2017 21:47   Result Date: 07/27/2017 CLINICAL DATA:  42 y/o  M; right-sided numbness, acute stroke. EXAM: CT ANGIOGRAPHY HEAD AND NECK CT PERFUSION BRAIN TECHNIQUE: Multidetector CT imaging of the head and neck was performed using the standard protocol during bolus administration of intravenous contrast. Multiplanar CT image reconstructions and MIPs were obtained  to evaluate the vascular anatomy. Carotid stenosis measurements (when applicable) are obtained utilizing NASCET criteria, using the distal internal carotid diameter as the denominator. Multiphase CT imaging of the brain was performed following IV bolus contrast injection. Subsequent parametric perfusion maps were calculated using RAPID software. CONTRAST:  See addendum. COMPARISON:  07/27/2016 CT head FINDINGS: CTA NECK FINDINGS Aortic arch: Standard branching. Imaged portion shows no evidence of aneurysm or dissection. No significant stenosis of the major arch vessel origins. Right carotid system: No evidence of dissection, stenosis (50% or greater) or occlusion. Left carotid system: No evidence of dissection, stenosis (50% or greater) or occlusion. Vertebral arteries: Right dominant. No evidence of dissection, stenosis (50% or greater) or occlusion. Skeleton: Negative. Other neck: Nodules in bilateral lobes of thyroid measuring up to 2.6 cm on the right (series 7 image 149). Upper chest: Negative. Review of the MIP images confirms the above findings CTA HEAD FINDINGS Anterior circulation: Left proximal M1 occlusion with intermediate left MCA distribution collateralization. Otherwise no significant stenosis, proximal occlusion, aneurysm, or vascular malformation in the anterior circulation. Posterior circulation: No significant stenosis, proximal occlusion, aneurysm, or vascular malformation. Venous sinuses: As permitted by contrast timing, patent. Anatomic variants: Bilateral fetal PCA. Small anterior communicating artery. Review of the MIP images confirms the above findings CT Brain Perfusion Findings: CBF (<30%) Volume: 71m Perfusion (Tmax>6.0s) volume: 1539mMismatch Volume: 7563mnfarction Location:Left MCA distribution IMPRESSION: CTA head: 1. Left M1 occlusion with intermediate left MCA distribution collateralization. 2. Otherwise patent anterior and posterior intracranial circulation. No additional large  vessel occlusion, aneurysm, or significant stenosis is identified. CTA neck: 1. Patent carotid and vertebral arteries. No dissection, aneurysm, or significant stenosis by NASCET criteria. 2. Nodules in bilateral lobes of thyroid measuring up to 2.6 cm on the right. Thyroid ultrasound is recommended on a nonemergent basis. CT perfusion head: By automated RAPID quantification: Infarct core 82 cc, ischemic penumbra 75 cc, infarct location in left MCA distribution. These results were called by telephone at the time of interpretation on 07/27/2017 at 9:23 pm to Dr. ASHWeldon Picking  ARORA , who verbally acknowledged these results. Electronically Signed: By: Kristine Garbe M.D. On: 07/27/2017 21:39   Ct Head Wo Contrast  Result Date: 07/28/2017 CLINICAL DATA:  Follow-up suspected LEFT middle cerebral artery extravasation during incomplete thrombectomy. EXAM: CT HEAD WITHOUT CONTRAST TECHNIQUE: Contiguous axial images were obtained from the base of the skull through the vertex without intravenous contrast. COMPARISON:  Multiple CT HEAD May 27, 2018 FINDINGS: BRAIN: Increased density LEFT MCA distribution extending into LEFT insula and LEFT frontoparietal sulci measuring to 91 Hounsfield units. Faint density interpeduncular cistern measuring 50 Hounsfield units. Confluent LEFT frontotemporal parietal hypodensity extending to LEFT basal ganglia. No convincing evidence of intraparenchymal hemorrhage. Regional LEFT cerebrum mass effect without midline shift. No hydrocephalus. Basal cisterns are patent. 1 cm cyst in caudal aspect splenium of corpus callosum versus pineal cyst, less likely. VASCULAR: Dense LEFT MCA (55 Hounsfield units). New density along anterior cerebral artery distribution. SKULL/SOFT TISSUES: No skull fracture. No significant soft tissue swelling. ORBITS/SINUSES: The included ocular globes and orbital contents are normal.Mild paranasal sinusitis. Life-support lines in place. Mastoid air cells are well  aerated. OTHER: None. IMPRESSION: 1. Evolving acute large LEFT MCA territory infarct without hemorrhagic conversion. 2. Postprocedural extra-axial contrast extravasation in addition to probable contrast staining and small volume subarachnoid hemorrhage. 3. New density anterior cerebral artery concerning for thromboembolism. Critical Value/emergent results were called by telephone at the time of interpretation on 07/28/2017 at 4:00 am to Dr. Amie [place] , who verbally acknowledged these results. Electronically Signed   By: Elon Alas M.D.   On: 07/28/2017 04:16   Ct Angio Neck W Or Wo Contrast  Addendum Date: 07/27/2017   ADDENDUM REPORT: 07/27/2017 21:47 CONTRAST:  Total of 200 cc Omnipaque 370 administered. Electronically Signed   By: Kristine Garbe M.D.   On: 07/27/2017 21:47   Result Date: 07/27/2017 CLINICAL DATA:  42 y/o  M; right-sided numbness, acute stroke. EXAM: CT ANGIOGRAPHY HEAD AND NECK CT PERFUSION BRAIN TECHNIQUE: Multidetector CT imaging of the head and neck was performed using the standard protocol during bolus administration of intravenous contrast. Multiplanar CT image reconstructions and MIPs were obtained to evaluate the vascular anatomy. Carotid stenosis measurements (when applicable) are obtained utilizing NASCET criteria, using the distal internal carotid diameter as the denominator. Multiphase CT imaging of the brain was performed following IV bolus contrast injection. Subsequent parametric perfusion maps were calculated using RAPID software. CONTRAST:  See addendum. COMPARISON:  07/27/2016 CT head FINDINGS: CTA NECK FINDINGS Aortic arch: Standard branching. Imaged portion shows no evidence of aneurysm or dissection. No significant stenosis of the major arch vessel origins. Right carotid system: No evidence of dissection, stenosis (50% or greater) or occlusion. Left carotid system: No evidence of dissection, stenosis (50% or greater) or occlusion. Vertebral arteries:  Right dominant. No evidence of dissection, stenosis (50% or greater) or occlusion. Skeleton: Negative. Other neck: Nodules in bilateral lobes of thyroid measuring up to 2.6 cm on the right (series 7 image 149). Upper chest: Negative. Review of the MIP images confirms the above findings CTA HEAD FINDINGS Anterior circulation: Left proximal M1 occlusion with intermediate left MCA distribution collateralization. Otherwise no significant stenosis, proximal occlusion, aneurysm, or vascular malformation in the anterior circulation. Posterior circulation: No significant stenosis, proximal occlusion, aneurysm, or vascular malformation. Venous sinuses: As permitted by contrast timing, patent. Anatomic variants: Bilateral fetal PCA. Small anterior communicating artery. Review of the MIP images confirms the above findings CT Brain Perfusion Findings: CBF (<30%) Volume: 19m Perfusion (Tmax>6.0s) volume:  129m Mismatch Volume: 786mInfarction Location:Left MCA distribution IMPRESSION: CTA head: 1. Left M1 occlusion with intermediate left MCA distribution collateralization. 2. Otherwise patent anterior and posterior intracranial circulation. No additional large vessel occlusion, aneurysm, or significant stenosis is identified. CTA neck: 1. Patent carotid and vertebral arteries. No dissection, aneurysm, or significant stenosis by NASCET criteria. 2. Nodules in bilateral lobes of thyroid measuring up to 2.6 cm on the right. Thyroid ultrasound is recommended on a nonemergent basis. CT perfusion head: By automated RAPID quantification: Infarct core 82 cc, ischemic penumbra 75 cc, infarct location in left MCA distribution. These results were called by telephone at the time of interpretation on 07/27/2017 at 9:23 pm to Dr. ASAmie Portland who verbally acknowledged these results. Electronically Signed: By: LaKristine Garbe.D. On: 07/27/2017 21:39   Ct Cerebral Perfusion W Contrast  Addendum Date: 07/27/2017   ADDENDUM  REPORT: 07/27/2017 21:47 CONTRAST:  Total of 200 cc Omnipaque 370 administered. Electronically Signed   By: LaKristine Garbe.D.   On: 07/27/2017 21:47   Result Date: 07/27/2017 CLINICAL DATA:  [age]/o  M; right-sided numbness, acute stroke. EXAM: CT ANGIOGRAPHY HEAD AND NECK CT PERFUSION BRAIN TECHNIQUE: Multidetector CT imaging of the head and neck was performed using the standard protocol during bolus administration of intravenous contrast. Multiplanar CT image reconstructions and MIPs were obtained to evaluate the vascular anatomy. Carotid stenosis measurements (when applicable) are obtained utilizing NASCET criteria, using the distal internal carotid diameter as the denominator. Multiphase CT imaging of the brain was performed following IV bolus contrast injection. Subsequent parametric perfusion maps were calculated using RAPID software. CONTRAST:  See addendum. COMPARISON:  07/27/2016 CT head FINDINGS: CTA NECK FINDINGS Aortic arch: Standard branching. Imaged portion shows no evidence of aneurysm or dissection. No significant stenosis of the major arch vessel origins. Right carotid system: No evidence of dissection, stenosis (50% or greater) or occlusion. Left carotid system: No evidence of dissection, stenosis (50% or greater) or occlusion. Vertebral arteries: Right dominant. No evidence of dissection, stenosis (50% or greater) or occlusion. Skeleton: Negative. Other neck: Nodules in bilateral lobes of thyroid measuring up to 2.6 cm on the right (series 7 image 149). Upper chest: Negative. Review of the MIP images confirms the above findings CTA HEAD FINDINGS Anterior circulation: Left proximal M1 occlusion with intermediate left MCA distribution collateralization. Otherwise no significant stenosis, proximal occlusion, aneurysm, or vascular malformation in the anterior circulation. Posterior circulation: No significant stenosis, proximal occlusion, aneurysm, or vascular malformation. Venous  sinuses: As permitted by contrast timing, patent. Anatomic variants: Bilateral fetal PCA. Small anterior communicating artery. Review of the MIP images confirms the above findings CT Brain Perfusion Findings: CBF (<30%) Volume: 826merfusion (Tmax>6.0s) volume: 157m43msmatch Volume: 75mL22marction Location:Left MCA distribution IMPRESSION: CTA head: 1. Left M1 occlusion with intermediate left MCA distribution collateralization. 2. Otherwise patent anterior and posterior intracranial circulation. No additional large vessel occlusion, aneurysm, or significant stenosis is identified. CTA neck: 1. Patent carotid and vertebral arteries. No dissection, aneurysm, or significant stenosis by NASCET criteria. 2. Nodules in bilateral lobes of thyroid measuring up to 2.6 cm on the right. Thyroid ultrasound is recommended on a nonemergent basis. CT perfusion head: By automated RAPID quantification: Infarct core 82 cc, ischemic penumbra 75 cc, infarct location in left MCA distribution. These results were called by telephone at the time of interpretation on 07/27/2017 at 9:23 pm to Dr. ASHISAmie Portlando verbally acknowledged these results. Electronically Signed: By: LanceKristine Garbe On:  07/27/2017 21:39   Dg Chest Port 1 View  Result Date: 07/28/2017 CLINICAL DATA:  42 year old male status post intubation. EXAM: PORTABLE CHEST 1 VIEW COMPARISON:  None. FINDINGS: An enteric tube is seen with side-port in the region of the gastroesophageal junction and tip in the proximal stomach. Recommend further advancing of the tube into the stomach by approximately 3-4 cm. A partially visualized structure with tip at the level of the T1 may represent the endotracheal tube. Clinical correlation is recommended. If an endotracheal tube is present recommend further advancing the tube by approximately 3 cm for optimal positioning. The lungs are clear. There is no pleural effusion or pneumothorax. The cardiac silhouette is within  normal limits. No acute osseous pathology. IMPRESSION: 1. Enteric tube with tip in the stomach and side-port in the region of the gastroesophageal junction. Recommend advancing the tube further into the stomach for optimal positioning. A partially visualized density over the T1 may represent the tip of the endotracheal tube. If an endotracheal tube is present recommend further advancing by approximately 3 cm for optimal positioning. 2. No acute cardiopulmonary process. Electronically Signed   By: Anner Crete M.D.   On: 07/28/2017 01:18   Ct Head Code Stroke Wo Contrast  Result Date: 07/27/2017 CLINICAL DATA:  Code stroke. 42 y/o M; right-sided weakness and facial droop. EXAM: CT HEAD WITHOUT CONTRAST TECHNIQUE: Contiguous axial images were obtained from the base of the skull through the vertex without intravenous contrast. COMPARISON:  None. FINDINGS: Brain: Faint lucency involving left insula and frontal operculum. No significant mass effect or hemorrhage at this time. No hydrocephalus, basilar cistern effacement, or extra-axial collection. Vascular: Dense left M1. Skull: Normal. Negative for fracture or focal lesion. Sinuses/Orbits: No acute finding. Other: None. ASPECTS [facility] Stroke Program Early CT Score) - Ganglionic level infarction (caudate, lentiform nuclei, internal capsule, insula, M1-M3 cortex): 5 - Supraganglionic infarction (M4-M6 cortex): 3 Total score (0-10 with 10 being normal): 8 IMPRESSION: 1. Left insula and frontal operculum lucency compatible with infarction. No hemorrhage or mass effect. 2. Left M1 hyperdensity, likely thrombus. 3. ASPECTS is 8 These results were called by telephone at the time of interpretation on 07/27/2017 at 9:10 pm to Dr. Rory Percy , who verbally acknowledged these results. Electronically Signed   By: Kristine Garbe M.D.   On: 07/27/2017 21:12   CT head 05/28/18 10am 1. Progressive left MCA territory infarct with further involvement of the left caudate  head and lentiform nucleus. 2. Extensive nonhemorrhagic infarcts involving the left temporal tip, left frontal operculum, left parietal lobe, and medial left frontal lobe, the distal left ACA territory. 3. Contrast extravasation compatible with perforation into the left sylvian fissure, the sulci over the convexity, and the interpeduncular notch. 4. Minimal midline shift. 5. Fluid in the sinuses likely secondary to intubation.  Transthoracic Echocardiogram - Left ventricle: The cavity size was normal. Wall thickness was   increased in a pattern of mild LVH. Systolic function was normal.   The estimated ejection fraction was in the range of 60% to 65%.   Wall motion was normal; there were no regional wall motion   abnormalities. Left ventricular diastolic function parameters   were normal. - Atrial septum: No defect or patent foramen ovale was identified.  Bilateral LE venous Dopplers - No evidence of deep vein thrombosis or baker's cysts bilaterally.   Ct Head Wo Contrast  Result Date: 07/29/2017 CLINICAL DATA:  Stroke. EXAM: CT HEAD WITHOUT CONTRAST TECHNIQUE: Contiguous axial images were obtained from the  base of the skull through the vertex without intravenous contrast. COMPARISON:  MRI brain/12/19.  CT head 07/28/2017. FINDINGS: Brain: A large left MCA and ACA territory infarct is again noted. There is diffuse hypoattenuation involving affected areas of the left frontal and parietal lobe. The superior and anterior temporal lobe is involved. Contrast and hemorrhage within the sylvian fissure and along the left MCA is again noted. No new hemorrhage is present. There is increasing mass effect with effacement of the left lateral ventricle and now 10 mm of midline shift. There is increasing prominence of the right temporal horn compatible with mass effect on the lateral ventricles. Effacement of the para mesencephalic cistern on the right suggest developing uncal herniation. Brainstem and  cerebellum are unremarkable. Vascular: Hyperdense left MCA and branches compatible with contrast and thrombus. Skull: Calvarium is intact. No focal lytic or blastic lesions are present. Sinuses/Orbits: Increased fluid is present in the left sphenoid sinus. There is mild mucosal thickening of the left maxillary sinus. Globes and orbits are within normal limits. IMPRESSION: 1. Evolving left MCA and ACA territory infarct increasing mass effect and midline shift, now measuring 10 mm. 2. Mild increasing dilation of the right temporal tip compatible with left-sided effacement. 3. Developing uncal herniation without significant downward herniation of the brainstem. 4. Subarachnoid contrast and hemorrhage again noted within the sylvian fissure. Electronically Signed   By: San Morelle M.D.   On: 07/29/2017 06:40   Mr Brain Wo Contrast  Result Date: 07/28/2017 CLINICAL DATA:  Follow-up LEFT-sided stroke. Status post tPA July 27, 2017. EXAM: MRI HEAD WITHOUT CONTRAST TECHNIQUE: Multiplanar, multiecho pulse sequences of the brain and surrounding structures were obtained without intravenous contrast. COMPARISON:  CT HEAD July 28, 2017 at 0852 hours FINDINGS: BRAIN: Confluent LEFT frontotemporal parietal and LEFT basal ganglia reduced diffusion with involvement of the mesial LEFT parietal and frontal lobes. Low ADC values. Susceptibility artifact within LEFT sylvian fissure and LEFT frontal parietal sulci. No intraparenchymal hemorrhage. Worsening 8 mm LEFT-to-RIGHT midline shift, increased from 2 mm. Partially effaced LEFT lateral ventricle with early RIGHT lateral ventricle entrapment. 11 mm T2 bright simple cyst within or adjacent to splenium of corpus callosum. No significant extra-axial fluid accumulation. VASCULAR: Normal major intracranial vascular flow voids present at skull base. SKULL AND UPPER CERVICAL SPINE: No abnormal sellar expansion. No suspicious calvarial bone marrow signal. Craniocervical  junction maintained. SINUSES/ORBITS: Secretions layering in scratches at layering secretions in paranasal sinuses. Life-support lines in place. Mastoid air cells are well aerated. The included ocular globes and orbital contents are non-suspicious. OTHER: None. IMPRESSION: 1. Evolving acute large LEFT MCA territory nonhemorrhagic infarct. Evolving acute distal LEFT ACA territory nonhemorrhagic infarct. 2. Worsening 8 mm LEFT-to-RIGHT midline shift with early RIGHT ventricle entrapment. 3. Susceptibility artifact LEFT sylvian fissure and LEFT sulci corresponding to known extra-axial hemorrhage. Acute findings text paged to [place] via AMION secure system on 07/28/2017 at 10:37 pm. Electronically Signed   By: Elon Alas M.D.   On: 07/28/2017 22:38   Ct Head Wo Contrast  Result Date: 07/31/2017 CLINICAL DATA:  Stroke, follow-up craniotomy. EXAM: CT HEAD WITHOUT CONTRAST TECHNIQUE: Contiguous axial images were obtained from the base of the skull through the vertex without intravenous contrast. COMPARISON:  CT HEAD July 29, 2017 and MRI of the head July 28, 2017 FINDINGS: BRAIN: Mild external herniation LEFT cerebrum via craniectomy defect. Evolving large LEFT acute to subacute MCA territory infarct without hemorrhagic conversion. Moderate LEFT frontoparietal ACA territory infarct. Regional mass effect without  midline shift. Persistent partial LEFT lateral ventricle effacement. Resolving RIGHT ventricular entrapment. Mild residual LEFT uncal herniation. Patent basal cisterns. VASCULAR: Dense LEFT MCA consistent with thromboembolism and, potentially trapped extravasated contrast. Focally dense LEFT ACA. SKULL/SOFT TISSUES: Interval LEFT decompressive craniectomy. LEFT scalp soft tissue swelling, subcutaneous gas with overlying skin staples. ORBITS/SINUSES: The included ocular globes and orbital contents are normal.Mild paranasal sinus mucosal thickening. LEFT sphenoid sinus air-fluid level. Mastoid  air cells are well aerated. OTHER: Life-support lines in place. IMPRESSION: 1. Evolving large LEFT MCA and moderate LEFT ACA territory infarct without hemorrhagic conversion. 2. Interval decompressive LEFT craniectomy with mild external herniation of LEFT cerebrum. Resolution of midline shift. 3. Resolving RIGHT ventricular entrapment. Electronically Signed   By: Elon Alas M.D.   On: 07/31/2017 04:26   Ct Head Wo Contrast  Result Date: 08/02/2017 CLINICAL DATA:  42 y/o  M; left MCA and ACA infarcts for follow-up. EXAM: CT HEAD WITHOUT CONTRAST TECHNIQUE: Contiguous axial images were obtained from the base of the skull through the vertex without intravenous contrast. COMPARISON:  07/31/2016 CT head FINDINGS: Brain: Stable distribution of left MCA and ACA infarctions. Stable edema with herniation of the left cerebral convexity with large craniectomy defect, effacement of left lateral ventricle, left uncal herniation, and 4 mm left-to-right midline shift. No downward herniation identified. Stable right ventricle size. No new stroke. No interval hemorrhage. Vascular: Hyperdensity within the left M1 and distal vessels likely representing thrombus. Skull: Postsurgical changes related to left hemi craniectomy stable edema in the overlying scalp and skin staples. Sinuses/Orbits: Sphenoid sinus opacification with fluid levels, partial opacification of left mastoid air cells, and left maxillary sinus mucosal thickening, probably due to intubation. Orbits are unremarkable. Other: None. IMPRESSION: 1. Stable distribution of left MCA and ACA infarctions. No new infarction or hemorrhage. 2. Stable mass effect with 4 mm left-to-right midline shift and mild herniation via craniectomy. Electronically Signed   By: Kristine Garbe M.D.   On: 08/02/2017 05:06     PHYSICAL EXAM  Temp:  [98.2 F (36.8 C)-100.2 F (37.9 C)] 98.8 F (37.1 C) (01/19 0800) Pulse Rate:  [61-99] 81 (01/19 0700) Resp:  [15-23] 20  (01/19 0700) BP: (129-158)/(86-105) 147/105 (01/19 0700) SpO2:  [90 %-100 %] 90 % (01/19 0700) Weight:  [188 lb 0.8 oz (85.3 kg)] 188 lb 0.8 oz (85.3 kg) (01/19 0500)  General - Well nourished, well developed, intubated.  Ophthalmologic - Fundi not visualized due to eye movement.  Cardiovascular - Regular rate and rhythm.  Neuro - extubated. awake alert and eyes open, left eyelid and scalp swelling,  speech is soft and can be understood with difficulty. Able to speak sentences. Able to name and repeat. No aphasia. PERRL, left gaze preference, barely cross midline, blinking to visual threat on the left, but not able to the left.  Able to follow all simple commands.  Facial asymmetry with right lower facial mild weakness .  Left upper extremity and lower extremity at least 4+/5, right upper and lower extremity dense hemiplegia.  DTR diminished on the right, no Babinski.  Sensation and coordination not cooperative.  Gait not tested.   ASSESSMENT/PLAN Jeremy [name] is a 42 y.o. male with history of HTN, OSA presenting with right-sided weakness, left gaze and right neglect. He receive IV t-PA.  However, unsuccessful for mechanical thrombectomy with complication of left SAH.  Stroke: Left large MCA infarct status post TPA with unsuccessful IR attempt causing left SAH.  Resultant  left gaze, right hemiplegia,  right neglect  CT head left MCA  hyperdense  CT head and neck left MCA occlusion  DSA with unsuccessful IR attempt causing left SAH  CT repeat showed left MCA large infarct with left stable SAH and stable subtle midline shift  MRI head large left MCA and ACA infarct with midline shift  Repeat CT 07/31/17 - much improved midline shift after hemicrani  2D Echo - EF 60-65%  LE venous Doppler no DVT  Will set up TEE next Monday  LDL NTC due to high TG  HgbA1c - 5.2  UDS negative  VTE prophylaxis -SCDs Diet NPO time specified Except for: Other (See Comments) DIET - DYS 1 Room  service appropriate? Yes; Fluid consistency: Nectar Thick  No antithrombotic prior to admission, now on ASA 340m.   Ongoing aggressive stroke risk factor management  Therapy recommendations:  CIR  Disposition:  Pending  Cerebral edema s/p decompressive hemicrani  CT head showed large left MCA infarct  MRI large left MCA and ACA infarcts with midline shift  CT repeat - increasing midline shift  CT repeat 07/31/17 - mild midline shift  Repeat CT 08/02/17 - stable midline shift  on 3% saline, @ 30cc/h now, will start taper off tomorrow  Na 144->150->153->152->153->150  Sodium goal 150 - 155  Neurosurgery on board - s/p hemi craniectomy   Fever - likely related to surgery, resolved  Afebrile for the last 24 hours  On temperature control protocol PRN  Off arctic sun  WBC 11.2->10.6->11.2->9.9  Hypertension  Stable but elevated than before  Likely related to increased ICP  Labetalol IV PRN  BP goal < 160 due to [facility]  Home meds: None  LDL NTC due to high TG, goal < 70  TG 417  On zetia and lipitor  Continue on discharge  B12 deficiency  B12 = 161   B12 im supplement daily x 7 days  Other Stroke Risk Factors  Obstructive sleep apnea  Other Active Problems  TSH mildly low but FT4 normal  Elevated ESR  Hospital day # 8  This patient is critically ill due to large left MCA infarct, SAH, cerebral edema and at significant risk of neurological worsening, death form brain herniation, seizure, heart failure. This patient's care requires constant monitoring of vital signs, hemodynamics, respiratory and cardiac monitoring, review of multiple databases, neurological assessment, discussion with family, other specialists and medical decision making of high complexity. I had long discussion with mom at bedside, updated pt current condition, treatment plan and potential prognosis. They expressed understanding and appreciation. I also discussed  with Dr. SDorthula Perfectand patient`s mother at bedside. Plan check transcranial Doppler bubble study for PFO. I spent 40 minutes of neurocritical care time in the care of this patient.  PAntony Contras MD Stroke Neurology 08/04/2017 1:02 PM   To contact Stroke Continuity provider, please refer to [email or web address] After hours, contact General Neurology

## 2017-08-04 NOTE — Progress Notes (Signed)
Transcranial bubble study completed. Dr. Pearlean BrownieSethi performed.  No apparent PFO.  Farrel DemarkJill Eunice, RDMS, RVT

## 2017-08-04 NOTE — Progress Notes (Signed)
      [facility] has been requested to perform a transesophageal echocardiogram on Damontay Ashkar for left MCA infarct/CVA.  After careful review of history and examination, the risks and benefits of transesophageal echocardiogram have been explained including risks of esophageal damage, perforation (1:10,000 risk), bleeding, pharyngeal hematoma as well as other potential complications associated with conscious sedation including aspiration, arrhythmia, respiratory failure and death. Alternatives to treatment were discussed, questions were answered. Patient is willing to proceed.    Georgie ChardJill Antowan Samford NP-C [facility] Pager: [phone]

## 2017-08-04 NOTE — Evaluation (Signed)
Occupational Therapy Evaluation Patient Details Name: Jeremy GashJao [name] MRN: [id number] DOB: 04-Dec-1975 Today's Date: 08/04/2017    History of Present Illness 42 yo admitted with right sided weakness with Left MCA CVA s/p tPA with partial revascularization with crani and bone flap and tPA reversal. Pt intubated 1/11-1/17. PMhx: HTn, sleep apnea, depression   Clinical Impression   PTA, pt was living with his wife and son and was independent and working. Pt currently required Mod A for grooming in supported sitting, Max A for dressing, bathing, and toileting, and Mod-Max A +2 for transfers. Pt presenting with decreased functional use of RUE, vision, cognition, and balance. Pt highly motivated to participate in therapy and return to PLOF. Pt will required further acute OT to optimize occupational performance and safe dc. Due pt motivation, family support, age, and diagnosis, recommend dc to CIR for intensive OT to increase safety and independence with ADLs and functional mobility as well as decrease caregiver support.     Follow Up Recommendations  CIR;Supervision/Assistance - 24 hour    Equipment Recommendations  Other (comment)(Defer to next venue)    Recommendations for Other Services PT consult;Rehab consult;Speech consult     Precautions / Restrictions Precautions Precautions: Fall Precaution Comments: no bone flap left, improving right inattention, right visual field cut, right hemiplegia Restrictions Weight Bearing Restrictions: No      Mobility Bed Mobility Overal bed mobility: Needs Assistance Bed Mobility: Supine to Sit     Supine to sit: Max assist;+2 for physical assistance     General bed mobility comments: pt attempted to use L LE to assist R LE, Significant truncal assist to come up and forward onto R elbow which was stabilized for w/bearing.  Extra work emphasized w/shifting and scooting to EOB  Transfers Overall transfer level: Needs assistance   Transfers: Sit  to/from [employer] CorporationStand;Stand Pivot Transfers Sit to Stand: Mod assist;+2 physical assistance Stand pivot transfers: Max assist;+2 physical assistance       General transfer comment: pt able to help more to power up with L side, but needed more assist for R knee block and w/shift/ truncal stability control for pivot.    Balance Overall balance assessment: Needs assistance   Sitting balance-Leahy Scale: Poor Sitting balance - Comments: pt requires mod assist overall for sitting balance.  pt able to bring pelvis to neutral and extend lumbar area to sit upright.  With any loss of focus pt lists posteriorly and to the right moderately heavily.  pt worked on holding upright midline orientation while kicking L LE out (decreasing BOS) and during ADL task  (washing face/brushing teeth/tongue) Postural control: Posterior lean   Standing balance-Leahy Scale: Poor Standing balance comment: mod +2 for balance in standing                           ADL either performed or assessed with clinical judgement   ADL Overall ADL's : Needs assistance/impaired Eating/Feeding: Moderate assistance;Sitting   Grooming: Moderate assistance;Oral care;Sitting;Wash/dry face;Maximal assistance(Max for sitting balance; Mod A for task with supported sitting) Grooming Details (indicate cue type and reason): Pt performing oral care with mouth swab. Required Mod A for task due to hemiparesis of RUE. Pt required Max A to maintain sitting balance Upper Body Bathing: Maximal assistance;Sitting;Bed level   Lower Body Bathing: Maximal assistance;+2 for physical assistance;Sitting/lateral leans;Bed level   Upper Body Dressing : Maximal assistance;Sitting;Bed level   Lower Body Dressing: Maximal assistance;Sit to/from stand;Bed level;+2 for physical assistance  Toilet Transfer: Maximal assistance;Stand-pivot;+2 for physical assistance(Simuated to recliner)           Functional mobility during ADLs: Maximal  assistance;+2 for physical assistance(Stand pivot only) General ADL Comments: Pt highly motivated to particiapte in therapy. Pt presenting with R hemiparesis, R inattention, and poor balance.      Vision Baseline Vision/History: Wears glasses(Contacts) Wears Glasses: At all times Patient Visual Report: No change from baseline Vision Assessment?: Vision impaired- to be further tested in functional context Additional Comments: Pt closing L eye throughout session. Denies double vision. Will assess further.     Perception     Praxis      Pertinent Vitals/Pain Pain Assessment: No/denies pain     Hand Dominance Left   Extremity/Trunk Assessment Upper Extremity Assessment Upper Extremity Assessment: RUE deficits/detail RUE Deficits / Details: Brunnstrom stage 1 with no active movement in ROM. Edema noted thorughout forearm and hand.  RUE Sensation: decreased proprioception;decreased light touch RUE Coordination: decreased fine motor;decreased gross motor   Lower Extremity Assessment Lower Extremity Assessment: Defer to PT evaluation RLE Deficits / Details: no noted AROM or sensation RLE Sensation: decreased proprioception;decreased light touch   Cervical / Trunk Assessment Cervical / Trunk Assessment: Other exceptions Cervical / Trunk Exceptions: Decreased core strength with lateral lean to R. Tendency for forward flexion   Communication Communication Communication: Other (comment)(pt very soft spoken and needing cues to use works not just nodding)   Cognition Arousal/Alertness: Awake/alert Behavior During Therapy: WFL for tasks assessed/performed Overall Cognitive Status: Impaired/Different from baseline Area of Impairment: Attention;Problem solving;Following commands;Safety/judgement;Awareness                   Current Attention Level: Selective   Following Commands: Follows multi-step commands with increased time;Follows one step commands with increased  time Safety/Judgement: Decreased awareness of safety;Decreased awareness of deficits Awareness: Emergent Problem Solving: Slow processing;Decreased initiation;Difficulty sequencing;Requires verbal cues;Requires tactile cues General Comments: Pt requiring increased time and cues throughout session. Pt highly motivated to particiapte. Cognitively will fatigue and had difficulty maintaining sitting posture during funcitonal task.    General Comments       Exercises Exercises: (ROM exercise prior to mobility)   Shoulder Instructions      Home Living Family/patient expects to be discharged to:: Private residence Living Arrangements: Spouse/significant other;Children Available Help at Discharge: Family;Available 24 hours/day Type of Home: Apartment Home Access: Stairs to enter [employer] of Steps: 3rd floor apartment   Home Layout: One level     Bathroom Shower/Tub: Chief Strategy Officer: Standard     Home Equipment: None          Prior Functioning/Environment Level of Independence: Independent        Comments: pt worked with Conservation officer, 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        OT Problem List: Decreased strength;Decreased range of motion;Decreased activity tolerance;Impaired balance (sitting and/or standing);Impaired vision/perception;Decreased cognition;Decreased safety awareness;Decreased knowledge of use of DME or AE;Decreased knowledge of precautions;Impaired UE functional use;Pain      OT Treatment/Interventions: Self-care/ADL training;Therapeutic exercise;Energy conservation;DME and/or AE instruction;Therapeutic activities;Patient/family education    OT Goals(Current goals can be found in the care plan section) Acute Rehab OT Goals Patient Stated Goal: return to work and home OT Goal Formulation: With patient Time For Goal Achievement: 08/18/17 Potential to Achieve Goals: Good ADL Goals Pt Will Perform Grooming: with min assist;sitting Pt  Will Perform Upper Body Dressing: with min assist;sitting Pt Will Transfer to Toilet: with min  assist;with +2 assist;bedside commode;stand pivot transfer Additional ADL Goal #1: Pt will attend to 75% of objects on R side during ADLs. Additional ADL Goal #2: Pt will demonstrate three edema management strategies with 1-2 VCs Additional ADL Goal #3: Pt will use RUE as dependent stabilizer during ADLs Additional ADL Goal #4: Pt will perform bed mobility with Min A +2 for safety  OT Frequency: Min 3X/week   Barriers to D/C:            Co-evaluation PT/OT/SLP Co-Evaluation/Treatment: Yes Reason for Co-Treatment: Complexity of the patient's impairments (multi-system involvement);For patient/therapist safety PT goals addressed during session: Mobility/safety with mobility OT goals addressed during session: ADL's and self-care      AM-PAC PT "6 Clicks" Daily Activity     Outcome Measure Help from another person eating meals?: A Lot Help from another person taking care of personal grooming?: A Lot Help from another person toileting, which includes using toliet, bedpan, or urinal?: A Lot Help from another person bathing (including washing, rinsing, drying)?: A Lot Help from another person to put on and taking off regular upper body clothing?: A Lot Help from another person to put on and taking off regular lower body clothing?: A Lot 6 Click Score: 12   End of Session Equipment Utilized During Treatment: Gait belt Nurse Communication: Mobility status;Other (comment)(Wanted water to drink adn needs thicken)  Activity Tolerance: Patient tolerated treatment well Patient left: in chair;with call bell/phone within reach  OT Visit Diagnosis: Unsteadiness on feet (R26.81);Other abnormalities of gait and mobility (R26.89);Muscle weakness (generalized) (M62.81);Other symptoms and signs involving cognitive function;Hemiplegia and hemiparesis;Pain Hemiplegia - Right/Left: Right Hemiplegia -  dominant/non-dominant: Non-Dominant Hemiplegia - caused by: Cerebral infarction                Time: 9604-5409 OT Time Calculation (min): 27 min Charges:  OT General Charges $OT Visit: 1 Visit OT Evaluation $OT Eval Moderate Complexity: 1 Mod G-Codes:     Leaf [employer] MSOT, OTR/L Acute Rehab Pager: [phone] Office: [phone]  Theodoro Grist Raaga Maeder 08/04/2017, 5:28 PM

## 2017-08-05 ENCOUNTER — Inpatient Hospital Stay (HOSPITAL_COMMUNITY): Payer: BLUE CROSS/BLUE SHIELD

## 2017-08-05 LAB — GLUCOSE, CAPILLARY
GLUCOSE-CAPILLARY: 102 mg/dL — AB (ref 65–99)
GLUCOSE-CAPILLARY: 80 mg/dL (ref 65–99)
GLUCOSE-CAPILLARY: 112 mg/dL — AB (ref 65–99)
Glucose-Capillary: 110 mg/dL — ABNORMAL HIGH (ref 65–99)
Glucose-Capillary: 112 mg/dL — ABNORMAL HIGH (ref 65–99)
Glucose-Capillary: 115 mg/dL — ABNORMAL HIGH (ref 65–99)
Glucose-Capillary: 99 mg/dL (ref 65–99)

## 2017-08-05 LAB — SODIUM: SODIUM: 143 mmol/L (ref 135–145)

## 2017-08-05 MED ORDER — SODIUM CHLORIDE 0.9 % IV SOLN
INTRAVENOUS | Status: DC
  Administered 2017-08-06 (×2): via INTRAVENOUS

## 2017-08-05 NOTE — Evaluation (Signed)
Speech Language Pathology Evaluation Patient Details Name: Jeremy [name] MRN: [id number] DOB: [birth year] Today's Date: 08/05/2017 Time: 4098-1191 SLP Time Calculation (min) (ACUTE ONLY): 20 min  Problem List:  Patient Active Problem List   Diagnosis Date Noted  . B12 deficiency   . Acute respiratory failure (HCC)   . Central line infiltration (HCC)   . Endotracheal tube present   . Fever   . Leukocytosis   . S/P craniotomy 07/30/2017  . Cerebral edema (HCC) 07/28/2017  . Hyperlipidemia 07/28/2017  . Stroke (cerebrum) (HCC) 07/27/2017   Past Medical History:  Past Medical History:  Diagnosis Date  . Anxiety   . Asthma    MILD AS A CHILD  . Depression   . Deviated septum   . Dyspnea    NORMAL SPIROMETRY 02/01/17 VISIT WITH DR HEDRICK  . Hypertension   . Sleep apnea    Past Surgical History:  Past Surgical History:  Procedure Laterality Date  . CRANIOTOMY Left 07/29/2017   Procedure: LEFT HEMI- CRANIECTOMY WITH PLACEMENT OF BONE FLAP IN ABDOMEN;  Surgeon: Lisbeth Renshaw, MD;  Location: [facility] OR;  Service: Neurosurgery;  Laterality: Left;  . IR ANGIO INTRA EXTRACRAN SEL COM CAROTID INNOMINATE UNI R MOD SED  07/28/2017  . IR ANGIO VERTEBRAL SEL VERTEBRAL UNI R MOD SED  07/28/2017  . IR PERCUTANEOUS ART THROMBECTOMY/INFUSION INTRACRANIAL INC DIAG ANGIO  07/28/2017  . NASAL SEPTOPLASTY W/ TURBINOPLASTY Bilateral 03/06/2017   Procedure: NASAL SEPTOPLASTY WITH TURBINATE REDUCTION;  Surgeon: Linus Salmons, MD;  Location: [facility] ORS;  Service: ENT;  Laterality: Bilateral;  . RADIOLOGY WITH ANESTHESIA N/A 07/27/2017   Procedure: RADIOLOGY WITH ANESTHESIA;  Surgeon: Julieanne Cotton, MD;  Location: [facility] OR;  Service: Radiology;  Laterality: N/A;   HPI:  42 y/o male with OSA, HTN was admitted on 1/11 with abrupt onset R facial droop. He was found to have evidence of a left MCA stroke. He was given IV TP and went unsuccessful IR attempt for thrombectomy. Some small subarachnoid bleeding and  extravasation post thromobectomy. CT imaging from 1/12 shows Progressive left MCA territory infarct with further involvement of the left caudate head and lentiform nucleus, Extensive nonhemorrhagic infarcts involving the left temporal tip, left frontal operculum, left parietal lobe, and medial left frontal lobe, the distal left ACA territory. Contrast extravasation compatible with perforation into the left sylvian fissure, the sulci over the convexity, and the interpeduncular notch. Taken for decompressive hemicrani 1/13. Intubated from 1/11 to 1/17.    Assessment / Plan / Recommendation Clinical Impression  Patient presents with cognitive communication impairment; voice is also aphonic, suspect due to CN X involvement vs intubation. Cognitive deficits include impaired sustained attention, delayed recall (1/4), problem solving, reasoning. Right inattention apparent, although pt able to turn head and attend to R with cues. Volitional speech output is decreased; pt requires ongoing cues and open-ended questions to provide brief, mostly word/phrase but some sentence-level responses. Auditory comprehension appears intact; pt answering questions appropriately and following multistep complex commands. Picture naming and discrimination difficult to assess given visual deficits; expressive language appears adequate for simple conversation. Cues for initiation required intermittently. Pt would benefit from ongoing ST services to maximize communication and cognition; continue to recommend CIR. Educated pt and mother re: f/u recommendations. Will continue to follow.     SLP Assessment  SLP Recommendation/Assessment: Patient needs continued Speech Lanaguage Pathology Services SLP Visit Diagnosis: Dysarthria and anarthria (R47.1);Cognitive communication deficit (R41.841)    Follow Up Recommendations  Inpatient Rehab    Frequency  and Duration min 3x week  2 weeks      SLP Evaluation Cognition  Overall  Cognitive Status: Impaired/Different from baseline Arousal/Alertness: Awake/alert Orientation Level: Oriented X4 Attention: Focused;Sustained;Selective Focused Attention: Appears intact Sustained Attention: Impaired Sustained Attention Impairment: Verbal basic;Functional basic Selective Attention: Impaired Selective Attention Impairment: Verbal basic;Functional basic Memory: Impaired Memory Impairment: Decreased recall of new information;Decreased short term memory Decreased Short Term Memory: Verbal basic(delayed recall 1/4) Awareness: Impaired Awareness Impairment: Emergent impairment Problem Solving: Impaired Problem Solving Impairment: Functional basic(errors in simple subtraction) Executive Function: Reasoning Reasoning: Impaired Reasoning Impairment: Verbal complex Safety/Judgment: Impaired Comments: left gaze preference, R neglect       Comprehension  Auditory Comprehension Overall Auditory Comprehension: Appears within functional limits for tasks assessed Yes/No Questions: Within Functional Limits Commands: Within Functional Limits Conversation: Simple Interfering Components: Attention Visual Recognition/Discrimination Discrimination: Within Function Limits Reading Comprehension Reading Status: Not tested    Expression Expression Primary Mode of Expression: Verbal Verbal Expression Initiation: Impaired Automatic Speech: Name;Social Response Level of Generative/Spontaneous Verbalization: Sentence Repetition: No impairment Pragmatics: Impairment Impairments: Eye contact Interfering Components: Attention Effective Techniques: Open ended questions Non-Verbal Means of Communication: Not applicable Other Verbal Expression Comments: reduced output Written Expression Dominant Hand: Left Written Expression: Not tested   Oral / Motor  Oral Motor/Sensory Function Overall Oral Motor/Sensory Function: Severe impairment Facial ROM: Reduced right;Suspected CN VII  (facial) dysfunction Facial Symmetry: Abnormal symmetry right;Suspected CN VII (facial) dysfunction Facial Strength: Reduced right;Suspected CN VII (facial) dysfunction Facial Sensation: Reduced right;Suspected CN V (Trigeminal) dysfunction Velum: Other (comment)(unable to visualize; question CN X dysfunction; pt aphonic) Mandible: Impaired;Suspected CN V (Trigeminal) dysfunction Motor Speech Overall Motor Speech: Impaired Respiration: Impaired Level of Impairment: Word Phonation: Aphonic;Low vocal intensity;Breathy Articulation: Within functional limitis Intelligibility: Intelligibility reduced Word: 75-100% accurate Phrase: 75-100% accurate Sentence: 75-100% accurate Conversation: 75-100% accurate(85%) Motor Planning: Witnin functional limits   GO                   Rondel BatonMary Beth Elexa Kivi, MS, [name] [phone]  Arlana LindauMary E Kajah Santizo 08/05/2017, 1:56 PM

## 2017-08-05 NOTE — Progress Notes (Signed)
STROKE TEAM PROGRESS NOTE   SUBJECTIVE (INTERVAL HISTORY) His mother is not at the bedside.  Pt  Is  sitting up in bed this am.  . Will taper 3% saline today and dc f tomorrow. Repeat CT scan of the head today shows improvement in cytotoxic edema and decreased midline shift  Transcranial Doppler bubble study yesterday showed no evidence of right to left intracardiac shunt Past Medical History:  Diagnosis Date  . Anxiety   . Asthma    MILD AS A CHILD  . Depression   . Deviated septum   . Dyspnea    NORMAL SPIROMETRY 02/01/17 VISIT WITH DR Metz  . Hypertension   . Sleep apnea    FAMILY HISTORY History reviewed. No pertinent family history.  SOCIAL HISTORY  reports that  has never smoked. he has never used smokeless tobacco. He reports that he drinks alcohol. He reports that he does not use drugs.   HOME MEDICATIONS:  Current Meds  Medication Sig  . spironolactone (ALDACTONE) 100 MG tablet Take 100 mg by mouth daily.      HOSPITAL MEDICATIONS:  . aspirin  325 mg Per Tube Daily  . atorvastatin  40 mg Oral q1800  . Chlorhexidine Gluconate Cloth  6 each Topical Daily  . cyanocobalamin  1,000 mcg Intramuscular Daily  . enoxaparin (LOVENOX) injection  40 mg Subcutaneous Q24H  . ezetimibe  10 mg Oral Daily  . insulin aspart  1-3 Units Subcutaneous Q4H  . mouth rinse  15 mL Mouth Rinse BID  . pantoprazole  40 mg Oral Daily  . sodium chloride flush  10-40 mL Intracatheter Q12H    OBJECTIVE Temp:  [98.5 F (36.9 C)-99.5 F (37.5 C)] 98.6 F (37 C) (01/20 0800) Pulse Rate:  [45-91] 72 (01/20 1000) Cardiac Rhythm: Normal sinus rhythm (01/20 0800) Resp:  [10-23] 19 (01/20 1000) BP: (121-155)/(89-110) 136/90 (01/20 1000) SpO2:  [94 %-100 %] 100 % (01/20 1000) Weight:  [188 lb 0.8 oz (85.3 kg)] 188 lb 0.8 oz (85.3 kg) (01/20 1000)  CBC:  Recent Labs  Lab 08/01/17 0433 08/02/17 0523  WBC 11.2* 9.9  HGB 9.8* 9.8*  HCT 30.2* 30.4*  MCV 92.4 92.7  PLT 235 292    Basic  Metabolic Panel:  Recent Labs  Lab 08/01/17 0433  08/02/17 0523  08/03/17 0503  08/04/17 2303 08/05/17 0452  NA 152*   < > 153*   < > 150*   < > 143 143  K 3.4*  --  3.5  --  3.6  --   --   --   CL 119*  --  118*  --  114*  --   --   --   CO2 25  --  26  --  25  --   --   --   GLUCOSE 157*  --  131*  --  116*  --   --   --   BUN 17  --  21*  --  22*  --   --   --   CREATININE 0.78  --  0.84  --  0.84  --   --   --   CALCIUM 8.2*  --  8.1*  --  8.5*  --   --   --   MG 2.3  --   --   --  2.3  --   --   --   PHOS 1.8*  --   --   --  3.2  --   --   --    < > =  values in this interval not displayed.    Lipid Panel:     Component Value Date/Time   CHOL 218 (H) 07/28/2017 0259   TRIG 316 (H) 07/30/2017 2323   HDL 28 (L) 07/28/2017 0259   CHOLHDL 7.8 07/28/2017 0259   VLDL UNABLE TO CALCULATE IF TRIGLYCERIDE OVER 400 mg/dL 07/28/2017 0259   LDLCALC UNABLE TO CALCULATE IF TRIGLYCERIDE OVER 400 mg/dL 07/28/2017 0259   HgbA1c:  Lab Results  Component Value Date   HGBA1C 5.2 07/28/2017   Urine Drug Screen:     Component Value Date/Time   LABOPIA NONE DETECTED 07/28/2017 0415   COCAINSCRNUR NONE DETECTED 07/28/2017 0415   LABBENZ NONE DETECTED 07/28/2017 0415   AMPHETMU NONE DETECTED 07/28/2017 0415   [place] DETECTED 07/28/2017 0415   LABBARB NONE DETECTED 07/28/2017 0415    Alcohol Level No results found for: [place] I have personally reviewed the radiological images below and agree with the radiology interpretations.  Ct Angio Head W Or Wo Contrast  Addendum Date: 07/27/2017   ADDENDUM REPORT: 07/27/2017 21:47 CONTRAST:  Total of 200 cc Omnipaque 370 administered. Electronically Signed   By: Kristine Garbe M.D.   On: 07/27/2017 21:47   Result Date: 07/27/2017 CLINICAL DATA:  42 y/o  M; right-sided numbness, acute stroke. EXAM: CT ANGIOGRAPHY HEAD AND NECK CT PERFUSION BRAIN TECHNIQUE: Multidetector CT imaging of the head and neck was performed using the  standard protocol during bolus administration of intravenous contrast. Multiplanar CT image reconstructions and MIPs were obtained to evaluate the vascular anatomy. Carotid stenosis measurements (when applicable) are obtained utilizing NASCET criteria, using the distal internal carotid diameter as the denominator. Multiphase CT imaging of the brain was performed following IV bolus contrast injection. Subsequent parametric perfusion maps were calculated using RAPID software. CONTRAST:  See addendum. COMPARISON:  07/27/2016 CT head FINDINGS: CTA NECK FINDINGS Aortic arch: Standard branching. Imaged portion shows no evidence of aneurysm or dissection. No significant stenosis of the major arch vessel origins. Right carotid system: No evidence of dissection, stenosis (50% or greater) or occlusion. Left carotid system: No evidence of dissection, stenosis (50% or greater) or occlusion. Vertebral arteries: Right dominant. No evidence of dissection, stenosis (50% or greater) or occlusion. Skeleton: Negative. Other neck: Nodules in bilateral lobes of thyroid measuring up to 2.6 cm on the right (series 7 image 149). Upper chest: Negative. Review of the MIP images confirms the above findings CTA HEAD FINDINGS Anterior circulation: Left proximal M1 occlusion with intermediate left MCA distribution collateralization. Otherwise no significant stenosis, proximal occlusion, aneurysm, or vascular malformation in the anterior circulation. Posterior circulation: No significant stenosis, proximal occlusion, aneurysm, or vascular malformation. Venous sinuses: As permitted by contrast timing, patent. Anatomic variants: Bilateral fetal PCA. Small anterior communicating artery. Review of the MIP images confirms the above findings CT Brain Perfusion Findings: CBF (<30%) Volume: 24m Perfusion (Tmax>6.0s) volume: 1554mMismatch Volume: 7553mnfarction Location:Left MCA distribution IMPRESSION: CTA head: 1. Left M1 occlusion with intermediate  left MCA distribution collateralization. 2. Otherwise patent anterior and posterior intracranial circulation. No additional large vessel occlusion, aneurysm, or significant stenosis is identified. CTA neck: 1. Patent carotid and vertebral arteries. No dissection, aneurysm, or significant stenosis by NASCET criteria. 2. Nodules in bilateral lobes of thyroid measuring up to 2.6 cm on the right. Thyroid ultrasound is recommended on a nonemergent basis. CT perfusion head: By automated RAPID quantification: Infarct core 82 cc, ischemic penumbra 75 cc, infarct location in left MCA distribution. These results were called  by telephone at the time of interpretation on 07/27/2017 at 9:23 pm to Dr. Amie Portland , who verbally acknowledged these results. Electronically Signed: By: Kristine Garbe M.D. On: 07/27/2017 21:39   Ct Head Wo Contrast  Result Date: 07/28/2017 CLINICAL DATA:  Follow-up suspected LEFT middle cerebral artery extravasation during incomplete thrombectomy. EXAM: CT HEAD WITHOUT CONTRAST TECHNIQUE: Contiguous axial images were obtained from the base of the skull through the vertex without intravenous contrast. COMPARISON:  Multiple CT HEAD May 27, 2018 FINDINGS: BRAIN: Increased density LEFT MCA distribution extending into LEFT insula and LEFT frontoparietal sulci measuring to 91 Hounsfield units. Faint density interpeduncular cistern measuring 50 Hounsfield units. Confluent LEFT frontotemporal parietal hypodensity extending to LEFT basal ganglia. No convincing evidence of intraparenchymal hemorrhage. Regional LEFT cerebrum mass effect without midline shift. No hydrocephalus. Basal cisterns are patent. 1 cm cyst in caudal aspect splenium of corpus callosum versus pineal cyst, less likely. VASCULAR: Dense LEFT MCA (55 Hounsfield units). New density along anterior cerebral artery distribution. SKULL/SOFT TISSUES: No skull fracture. No significant soft tissue swelling. ORBITS/SINUSES: The  included ocular globes and orbital contents are normal.Mild paranasal sinusitis. Life-support lines in place. Mastoid air cells are well aerated. OTHER: None. IMPRESSION: 1. Evolving acute large LEFT MCA territory infarct without hemorrhagic conversion. 2. Postprocedural extra-axial contrast extravasation in addition to probable contrast staining and small volume subarachnoid hemorrhage. 3. New density anterior cerebral artery concerning for thromboembolism. Critical Value/emergent results were called by telephone at the time of interpretation on 07/28/2017 at 4:00 am to Dr. Amie Portland , who verbally acknowledged these results. Electronically Signed   By: Elon Alas M.D.   On: 07/28/2017 04:16   Ct Angio Neck W Or Wo Contrast  Addendum Date: 07/27/2017   ADDENDUM REPORT: 07/27/2017 21:47 CONTRAST:  Total of 200 cc Omnipaque 370 administered. Electronically Signed   By: Kristine Garbe M.D.   On: 07/27/2017 21:47   Result Date: 07/27/2017 CLINICAL DATA:  42 y/o  M; right-sided numbness, acute stroke. EXAM: CT ANGIOGRAPHY HEAD AND NECK CT PERFUSION BRAIN TECHNIQUE: Multidetector CT imaging of the head and neck was performed using the standard protocol during bolus administration of intravenous contrast. Multiplanar CT image reconstructions and MIPs were obtained to evaluate the vascular anatomy. Carotid stenosis measurements (when applicable) are obtained utilizing NASCET criteria, using the distal internal carotid diameter as the denominator. Multiphase CT imaging of the brain was performed following IV bolus contrast injection. Subsequent parametric perfusion maps were calculated using RAPID software. CONTRAST:  See addendum. COMPARISON:  07/27/2016 CT head FINDINGS: CTA NECK FINDINGS Aortic arch: Standard branching. Imaged portion shows no evidence of aneurysm or dissection. No significant stenosis of the major arch vessel origins. Right carotid system: No evidence of dissection, stenosis  (50% or greater) or occlusion. Left carotid system: No evidence of dissection, stenosis (50% or greater) or occlusion. Vertebral arteries: Right dominant. No evidence of dissection, stenosis (50% or greater) or occlusion. Skeleton: Negative. Other neck: Nodules in bilateral lobes of thyroid measuring up to 2.6 cm on the right (series 7 image 149). Upper chest: Negative. Review of the MIP images confirms the above findings CTA HEAD FINDINGS Anterior circulation: Left proximal M1 occlusion with intermediate left MCA distribution collateralization. Otherwise no significant stenosis, proximal occlusion, aneurysm, or vascular malformation in the anterior circulation. Posterior circulation: No significant stenosis, proximal occlusion, aneurysm, or vascular malformation. Venous sinuses: As permitted by contrast timing, patent. Anatomic variants: Bilateral fetal PCA. Small anterior communicating artery. Review of the MIP images  confirms the above findings CT Brain Perfusion Findings: CBF (<30%) Volume: 57m Perfusion (Tmax>6.0s) volume: 1549mMismatch Volume: 7514mnfarction Location:Left MCA distribution IMPRESSION: CTA head: 1. Left M1 occlusion with intermediate left MCA distribution collateralization. 2. Otherwise patent anterior and posterior intracranial circulation. No additional large vessel occlusion, aneurysm, or significant stenosis is identified. CTA neck: 1. Patent carotid and vertebral arteries. No dissection, aneurysm, or significant stenosis by NASCET criteria. 2. Nodules in bilateral lobes of thyroid measuring up to 2.6 cm on the right. Thyroid ultrasound is recommended on a nonemergent basis. CT perfusion head: By automated RAPID quantification: Infarct core 82 cc, ischemic penumbra 75 cc, infarct location in left MCA distribution. These results were called by telephone at the time of interpretation on 07/27/2017 at 9:23 pm to Dr. ASHAmie Portlandwho verbally acknowledged these results. Electronically  Signed: By: LanKristine GarbeD. On: 07/27/2017 21:39   Ct Cerebral Perfusion W Contrast  Addendum Date: 07/27/2017   ADDENDUM REPORT: 07/27/2017 21:47 CONTRAST:  Total of 200 cc Omnipaque 370 administered. Electronically Signed   By: LanKristine GarbeD.   On: 07/27/2017 21:47   Result Date: 07/27/2017 CLINICAL DATA:  41 8o  M; right-sided numbness, acute stroke. EXAM: CT ANGIOGRAPHY HEAD AND NECK CT PERFUSION BRAIN TECHNIQUE: Multidetector CT imaging of the head and neck was performed using the standard protocol during bolus administration of intravenous contrast. Multiplanar CT image reconstructions and MIPs were obtained to evaluate the vascular anatomy. Carotid stenosis measurements (when applicable) are obtained utilizing NASCET criteria, using the distal internal carotid diameter as the denominator. Multiphase CT imaging of the brain was performed following IV bolus contrast injection. Subsequent parametric perfusion maps were calculated using RAPID software. CONTRAST:  See addendum. COMPARISON:  07/27/2016 CT head FINDINGS: CTA NECK FINDINGS Aortic arch: Standard branching. Imaged portion shows no evidence of aneurysm or dissection. No significant stenosis of the major arch vessel origins. Right carotid system: No evidence of dissection, stenosis (50% or greater) or occlusion. Left carotid system: No evidence of dissection, stenosis (50% or greater) or occlusion. Vertebral arteries: Right dominant. No evidence of dissection, stenosis (50% or greater) or occlusion. Skeleton: Negative. Other neck: Nodules in bilateral lobes of thyroid measuring up to 2.6 cm on the right (series 7 image 149). Upper chest: Negative. Review of the MIP images confirms the above findings CTA HEAD FINDINGS Anterior circulation: Left proximal M1 occlusion with intermediate left MCA distribution collateralization. Otherwise no significant stenosis, proximal occlusion, aneurysm, or vascular malformation in the  anterior circulation. Posterior circulation: No significant stenosis, proximal occlusion, aneurysm, or vascular malformation. Venous sinuses: As permitted by contrast timing, patent. Anatomic variants: Bilateral fetal PCA. Small anterior communicating artery. Review of the MIP images confirms the above findings CT Brain Perfusion Findings: CBF (<30%) Volume: 72m28mrfusion (Tmax>6.0s) volume: 157mL47mmatch Volume: 75mL 66mrction Location:Left MCA distribution IMPRESSION: CTA head: 1. Left M1 occlusion with intermediate left MCA distribution collateralization. 2. Otherwise patent anterior and posterior intracranial circulation. No additional large vessel occlusion, aneurysm, or significant stenosis is identified. CTA neck: 1. Patent carotid and vertebral arteries. No dissection, aneurysm, or significant stenosis by NASCET criteria. 2. Nodules in bilateral lobes of thyroid measuring up to 2.6 cm on the right. Thyroid ultrasound is recommended on a nonemergent basis. CT perfusion head: By automated RAPID quantification: Infarct core 82 cc, ischemic penumbra 75 cc, infarct location in left MCA distribution. These results were called by telephone at the time of interpretation on 07/27/2017 at 9:23 pm to Dr. ASHISHWeldon Picking  ARORA , who verbally acknowledged these results. Electronically Signed: By: Kristine Garbe M.D. On: 07/27/2017 21:39   Dg Chest Port 1 View  Result Date: 07/28/2017 CLINICAL DATA:  42 year old male status post intubation. EXAM: PORTABLE CHEST 1 VIEW COMPARISON:  None. FINDINGS: An enteric tube is seen with side-port in the region of the gastroesophageal junction and tip in the proximal stomach. Recommend further advancing of the tube into the stomach by approximately 3-4 cm. A partially visualized structure with tip at the level of the T1 may represent the endotracheal tube. Clinical correlation is recommended. If an endotracheal tube is present recommend further advancing the tube by  approximately 3 cm for optimal positioning. The lungs are clear. There is no pleural effusion or pneumothorax. The cardiac silhouette is within normal limits. No acute osseous pathology. IMPRESSION: 1. Enteric tube with tip in the stomach and side-port in the region of the gastroesophageal junction. Recommend advancing the tube further into the stomach for optimal positioning. A partially visualized density over the T1 may represent the tip of the endotracheal tube. If an endotracheal tube is present recommend further advancing by approximately 3 cm for optimal positioning. 2. No acute cardiopulmonary process. Electronically Signed   By: Anner Crete M.D.   On: 07/28/2017 01:18   Ct Head Code Stroke Wo Contrast  Result Date: 07/27/2017 CLINICAL DATA:  Code stroke. 42 y/o M; right-sided weakness and facial droop. EXAM: CT HEAD WITHOUT CONTRAST TECHNIQUE: Contiguous axial images were obtained from the base of the skull through the vertex without intravenous contrast. COMPARISON:  None. FINDINGS: Brain: Faint lucency involving left insula and frontal operculum. No significant mass effect or hemorrhage at this time. No hydrocephalus, basilar cistern effacement, or extra-axial collection. Vascular: Dense left M1. Skull: Normal. Negative for fracture or focal lesion. Sinuses/Orbits: No acute finding. Other: None. ASPECTS [facility] Stroke Program Early CT Score) - Ganglionic level infarction (caudate, lentiform nuclei, internal capsule, insula, M1-M3 cortex): 5 - Supraganglionic infarction (M4-M6 cortex): 3 Total score (0-10 with 10 being normal): 8 IMPRESSION: 1. Left insula and frontal operculum lucency compatible with infarction. No hemorrhage or mass effect. 2. Left M1 hyperdensity, likely thrombus. 3. ASPECTS is 8 These results were called by telephone at the time of interpretation on 07/27/2017 at 9:10 pm to Dr. Rory Percy , who verbally acknowledged these results. Electronically Signed   By: Kristine Garbe M.D.   On: 07/27/2017 21:12   CT head 05/28/18 10am 1. Progressive left MCA territory infarct with further involvement of the left caudate head and lentiform nucleus. 2. Extensive nonhemorrhagic infarcts involving the left temporal tip, left frontal operculum, left parietal lobe, and medial left frontal lobe, the distal left ACA territory. 3. Contrast extravasation compatible with perforation into the left sylvian fissure, the sulci over the convexity, and the interpeduncular notch. 4. Minimal midline shift. 5. Fluid in the sinuses likely secondary to intubation.  Transthoracic Echocardiogram - Left ventricle: The cavity size was normal. Wall thickness was   increased in a pattern of mild LVH. Systolic function was normal.   The estimated ejection fraction was in the range of 60% to 65%.   Wall motion was normal; there were no regional wall motion   abnormalities. Left ventricular diastolic function parameters   were normal. - Atrial septum: No defect or patent foramen ovale was identified.  Bilateral LE venous Dopplers - No evidence of deep vein thrombosis or baker's cysts bilaterally.   Ct Head Wo Contrast  Result Date: 07/29/2017 CLINICAL DATA:  Stroke. EXAM: CT HEAD WITHOUT CONTRAST TECHNIQUE: Contiguous axial images were obtained from the base of the skull through the vertex without intravenous contrast. COMPARISON:  MRI brain/12/19.  CT head 07/28/2017. FINDINGS: Brain: A large left MCA and ACA territory infarct is again noted. There is diffuse hypoattenuation involving affected areas of the left frontal and parietal lobe. The superior and anterior temporal lobe is involved. Contrast and hemorrhage within the sylvian fissure and along the left MCA is again noted. No new hemorrhage is present. There is increasing mass effect with effacement of the left lateral ventricle and now 10 mm of midline shift. There is increasing prominence of the right temporal horn  compatible with mass effect on the lateral ventricles. Effacement of the para mesencephalic cistern on the right suggest developing uncal herniation. Brainstem and cerebellum are unremarkable. Vascular: Hyperdense left MCA and branches compatible with contrast and thrombus. Skull: Calvarium is intact. No focal lytic or blastic lesions are present. Sinuses/Orbits: Increased fluid is present in the left sphenoid sinus. There is mild mucosal thickening of the left maxillary sinus. Globes and orbits are within normal limits. IMPRESSION: 1. Evolving left MCA and ACA territory infarct increasing mass effect and midline shift, now measuring 10 mm. 2. Mild increasing dilation of the right temporal tip compatible with left-sided effacement. 3. Developing uncal herniation without significant downward herniation of the brainstem. 4. Subarachnoid contrast and hemorrhage again noted within the sylvian fissure. Electronically Signed   By: San Morelle M.D.   On: 07/29/2017 06:40   Mr Brain Wo Contrast  Result Date: 07/28/2017 CLINICAL DATA:  Follow-up LEFT-sided stroke. Status post tPA July 27, 2017. EXAM: MRI HEAD WITHOUT CONTRAST TECHNIQUE: Multiplanar, multiecho pulse sequences of the brain and surrounding structures were obtained without intravenous contrast. COMPARISON:  CT HEAD July 28, 2017 at 0852 hours FINDINGS: BRAIN: Confluent LEFT frontotemporal parietal and LEFT basal ganglia reduced diffusion with involvement of the mesial LEFT parietal and frontal lobes. Low ADC values. Susceptibility artifact within LEFT sylvian fissure and LEFT frontal parietal sulci. No intraparenchymal hemorrhage. Worsening 8 mm LEFT-to-RIGHT midline shift, increased from 2 mm. Partially effaced LEFT lateral ventricle with early RIGHT lateral ventricle entrapment. 11 mm T2 bright simple cyst within or adjacent to splenium of corpus callosum. No significant extra-axial fluid accumulation. VASCULAR: Normal major intracranial  vascular flow voids present at skull base. SKULL AND UPPER CERVICAL SPINE: No abnormal sellar expansion. No suspicious calvarial bone marrow signal. Craniocervical junction maintained. SINUSES/ORBITS: Secretions layering in scratches at layering secretions in paranasal sinuses. Life-support lines in place. Mastoid air cells are well aerated. The included ocular globes and orbital contents are non-suspicious. OTHER: None. IMPRESSION: 1. Evolving acute large LEFT MCA territory nonhemorrhagic infarct. Evolving acute distal LEFT ACA territory nonhemorrhagic infarct. 2. Worsening 8 mm LEFT-to-RIGHT midline shift with early RIGHT ventricle entrapment. 3. Susceptibility artifact LEFT sylvian fissure and LEFT sulci corresponding to known extra-axial hemorrhage. Acute findings text paged to [place] via AMION secure system on 07/28/2017 at 10:37 pm. Electronically Signed   By: Elon Alas M.D.   On: 07/28/2017 22:38   Ct Head Wo Contrast  Result Date: 07/31/2017 CLINICAL DATA:  Stroke, follow-up craniotomy. EXAM: CT HEAD WITHOUT CONTRAST TECHNIQUE: Contiguous axial images were obtained from the base of the skull through the vertex without intravenous contrast. COMPARISON:  CT HEAD July 29, 2017 and MRI of the head July 28, 2017 FINDINGS: BRAIN: Mild external herniation LEFT cerebrum via craniectomy defect. Evolving large LEFT acute to subacute MCA territory  infarct without hemorrhagic conversion. Moderate LEFT frontoparietal ACA territory infarct. Regional mass effect without midline shift. Persistent partial LEFT lateral ventricle effacement. Resolving RIGHT ventricular entrapment. Mild residual LEFT uncal herniation. Patent basal cisterns. VASCULAR: Dense LEFT MCA consistent with thromboembolism and, potentially trapped extravasated contrast. Focally dense LEFT ACA. SKULL/SOFT TISSUES: Interval LEFT decompressive craniectomy. LEFT scalp soft tissue swelling, subcutaneous gas with overlying skin  staples. ORBITS/SINUSES: The included ocular globes and orbital contents are normal.Mild paranasal sinus mucosal thickening. LEFT sphenoid sinus air-fluid level. Mastoid air cells are well aerated. OTHER: Life-support lines in place. IMPRESSION: 1. Evolving large LEFT MCA and moderate LEFT ACA territory infarct without hemorrhagic conversion. 2. Interval decompressive LEFT craniectomy with mild external herniation of LEFT cerebrum. Resolution of midline shift. 3. Resolving RIGHT ventricular entrapment. Electronically Signed   By: Elon Alas M.D.   On: 07/31/2017 04:26   Ct Head Wo Contrast  Result Date: 08/02/2017 CLINICAL DATA:  42 y/o  M; left MCA and ACA infarcts for follow-up. EXAM: CT HEAD WITHOUT CONTRAST TECHNIQUE: Contiguous axial images were obtained from the base of the skull through the vertex without intravenous contrast. COMPARISON:  07/31/2016 CT head FINDINGS: Brain: Stable distribution of left MCA and ACA infarctions. Stable edema with herniation of the left cerebral convexity with large craniectomy defect, effacement of left lateral ventricle, left uncal herniation, and 4 mm left-to-right midline shift. No downward herniation identified. Stable right ventricle size. No new stroke. No interval hemorrhage. Vascular: Hyperdensity within the left M1 and distal vessels likely representing thrombus. Skull: Postsurgical changes related to left hemi craniectomy stable edema in the overlying scalp and skin staples. Sinuses/Orbits: Sphenoid sinus opacification with fluid levels, partial opacification of left mastoid air cells, and left maxillary sinus mucosal thickening, probably due to intubation. Orbits are unremarkable. Other: None. IMPRESSION: 1. Stable distribution of left MCA and ACA infarctions. No new infarction or hemorrhage. 2. Stable mass effect with 4 mm left-to-right midline shift and mild herniation via craniectomy. Electronically Signed   By: Kristine Garbe M.D.   On:  08/02/2017 05:06     PHYSICAL EXAM  Temp:  [98.5 F (36.9 C)-99.5 F (37.5 C)] 98.6 F (37 C) (01/20 0800) Pulse Rate:  [45-91] 72 (01/20 1000) Resp:  [10-23] 19 (01/20 1000) BP: (121-155)/(89-110) 136/90 (01/20 1000) SpO2:  [94 %-100 %] 100 % (01/20 1000) Weight:  [188 lb 0.8 oz (85.3 kg)] 188 lb 0.8 oz (85.3 kg) (01/20 1000)  General - Well nourished, well developed, intubated.  Ophthalmologic - Fundi not visualized due to eye movement.  Cardiovascular - Regular rate and rhythm.  Neuro - extubated. awake alert and eyes open, left eyelid and scalp swelling,  speech is soft and can be understood with difficulty. Able to speak sentences. Able to name and repeat. No aphasia. PERRL, left gaze preference, barely cross midline, blinking to visual threat on the left, but not able to the left.  Able to follow all simple commands.  Facial asymmetry with right lower facial mild weakness .  Left upper extremity and lower extremity at least 4+/5, right upper and lower extremity dense hemiplegia.  DTR diminished on the right, no Babinski.  Sensation and coordination not cooperative.  Gait not tested.   ASSESSMENT/PLAN Mr. Dequan Kindred is a 42 y.o. male with history of HTN, OSA presenting with right-sided weakness, left gaze and right neglect. He receive IV t-PA.  However, unsuccessful for mechanical thrombectomy with complication of left SAH.  Stroke: Left large MCA infarct status post TPA  with unsuccessful IR attempt causing left SAH.  Resultant  left gaze, right hemiplegia, right neglect  CT head left MCA  hyperdense  CT head and neck left MCA occlusion  DSA with unsuccessful IR attempt causing left SAH  CT repeat showed left MCA large infarct with left stable SAH and stable subtle midline shift  MRI head large left MCA and ACA infarct with midline shift  Repeat CT 07/31/17 - much improved midline shift after hemicrani  2D Echo - EF 60-65%  LE venous Doppler no DVT  Will set up TEE  next Monday  LDL NTC due to high TG  HgbA1c - 5.2  UDS negative  VTE prophylaxis -SCDs Diet NPO time specified Except for: Other (See Comments) DIET - DYS 1 Room service appropriate? Yes; Fluid consistency: Nectar Thick  No antithrombotic prior to admission, now on ASA 365m.   Ongoing aggressive stroke risk factor management  Therapy recommendations:  CIR  Disposition:  Pending  Cerebral edema s/p decompressive hemicrani  CT head showed large left MCA infarct  MRI large left MCA and ACA infarcts with midline shift  CT repeat - increasing midline shift  CT repeat 07/31/17 - mild midline shift  Repeat CT 08/02/17 - stable midline shift  on 3% saline, @ 30cc/h now, will start taper off tomorrow  Na 144->150->153->152->153->150  Sodium goal 150 - 155  Neurosurgery on board - s/p hemi craniectomy   Fever - likely related to surgery, resolved  Afebrile for the last 24 hours  On temperature control protocol PRN  Off arctic sun  WBC 11.2->10.6->11.2->9.9  Hypertension  Stable but elevated than before  Likely related to increased ICP  Labetalol IV PRN  BP goal < 160 [facility]  Home meds: None  LDL NTC due to high TG, goal < 70  TG 417  On zetia and lipitor  Continue on discharge  B12 deficiency  B12 = 161   B12 im supplement daily x 7 days  Other Stroke Risk Factors  Obstructive sleep apnea  Other Active Problems  TSH mildly low but FT4 normal  Elevated ESR and rheumatoid factor  Hospital day # 9  This patient is critically ill due to large left MCA infarct, SAH, cerebral edema and at significant risk of neurological worsening, death form brain herniation, seizure, heart failure. This patient's care requires constant monitoring of vital signs, hemodynamics, respiratory and cardiac monitoring, review of multiple databases, neurological assessment, discussion with family, other specialists and medical decision making of  high complexity. . I   discussed with Dr. SDorthula Perfect plan to taper hypertonic saline drip today and discontinue. Transesophageal echocardiogram tomorrow. Likely transfer to inpatient rehabilitation over the next few days. I spent 30 minutes of neurocritical care time in the care of this patient.  PAntony Contras MD Stroke Neurology 08/05/2017 11:49 AM   To contact Stroke Continuity provider, please refer to [email or web address] After hours, contact General Neurology

## 2017-08-06 ENCOUNTER — Encounter (HOSPITAL_COMMUNITY): Payer: Self-pay | Admitting: Certified Registered Nurse Anesthetist

## 2017-08-06 ENCOUNTER — Other Ambulatory Visit (HOSPITAL_COMMUNITY): Payer: BLUE CROSS/BLUE SHIELD

## 2017-08-06 ENCOUNTER — Inpatient Hospital Stay (HOSPITAL_COMMUNITY): Payer: BLUE CROSS/BLUE SHIELD | Admitting: Certified Registered Nurse Anesthetist

## 2017-08-06 ENCOUNTER — Encounter (HOSPITAL_COMMUNITY)
Admission: EM | Disposition: A | Discharge status: Rehabilitation Facility | Payer: Self-pay | Source: Home / Self Care | Attending: Neurology

## 2017-08-06 LAB — GLUCOSE, CAPILLARY
GLUCOSE-CAPILLARY: 109 mg/dL — AB (ref 65–99)
GLUCOSE-CAPILLARY: 136 mg/dL — AB (ref 65–99)
GLUCOSE-CAPILLARY: 116 mg/dL — AB (ref 65–99)
Glucose-Capillary: 111 mg/dL — ABNORMAL HIGH (ref 65–99)
Glucose-Capillary: 116 mg/dL — ABNORMAL HIGH (ref 65–99)

## 2017-08-06 LAB — BASIC METABOLIC PANEL
Anion gap: 8 (ref 5–15)
BUN: 14 mg/dL (ref 6–20)
CALCIUM: 7.8 mg/dL — AB (ref 8.9–10.3)
CO2: 25 mmol/L (ref 22–32)
CREATININE: 0.74 mg/dL (ref 0.61–1.24)
Chloride: 109 mmol/L (ref 101–111)
GFR calc Af Amer: >60 mL/min (ref >60)
GFR calc non Af Amer: >60 mL/min (ref >60)
Glucose, Bld: 115 mg/dL — ABNORMAL HIGH (ref 65–99)
Potassium: 3.1 mmol/L — ABNORMAL LOW (ref 3.5–5.1)
SODIUM: 142 mmol/L (ref 135–145)

## 2017-08-06 SURGERY — INVASIVE LAB ABORTED CASE
Anesthesia: Monitor Anesthesia Care

## 2017-08-06 MED ORDER — POTASSIUM CHLORIDE 20 MEQ/15ML (10%) PO SOLN: 40.0000 meq | Freq: Once | ORAL | Status: DC

## 2017-08-06 MED ORDER — LIDOCAINE 2% (20 MG/ML) 5 ML SYRINGE
INTRAMUSCULAR | Status: DC | PRN
  Administered 2017-08-06: 40 mg via INTRAVENOUS

## 2017-08-06 MED ORDER — PROPOFOL 500 MG/50ML IV EMUL
INTRAVENOUS | Status: DC | PRN
  Administered 2017-08-06: 75 ug/kg/min via INTRAVENOUS

## 2017-08-06 MED ORDER — PROPOFOL 10 MG/ML IV BOLUS
INTRAVENOUS | Status: DC | PRN
  Administered 2017-08-06: 20 mg via INTRAVENOUS

## 2017-08-06 MED ORDER — GERHARDT'S BUTT CREAM
TOPICAL_CREAM | Freq: Two times a day (BID) | CUTANEOUS | Status: DC
  Administered 2017-08-06 – 2017-08-07 (×3): via TOPICAL
  Filled 2017-08-06: qty 1

## 2017-08-06 MED ORDER — POTASSIUM CHLORIDE 20 MEQ/15ML (10%) PO SOLN
40.0000 meq | Freq: Two times a day (BID) | ORAL | Status: DC
  Administered 2017-08-06 – 2017-08-07 (×3): 40 meq via ORAL
  Filled 2017-08-06 (×3): qty 30

## 2017-08-06 NOTE — Progress Notes (Addendum)
PT Cancellation Note  Patient Details Name: Jeremy [name] MRN: [id number] DOB: [birth year]   Cancelled Treatment:    Reason Eval/Treat Not Completed: Patient at procedure or test/unavailable. Pt off floor at a procedure. PT to return as able.   [name] M [name] 08/06/2017, 9:20 AM   Lewis ShockAshly Jayceion Lisenby, PT, DPT Pager #: [phone] Office #: [phone]

## 2017-08-06 NOTE — Progress Notes (Signed)
STROKE TEAM PROGRESS NOTE   SUBJECTIVE (INTERVAL HISTORY)  Patient was examined just after he came back from TEE which was attempted but could not be performed as patient had difficult time opening his mouth to place the bite block. He is drowsy but follows commands appropriately. Past Medical History:  Diagnosis Date  . Anxiety   . Asthma    MILD AS A CHILD  . Depression   . Deviated septum   . Dyspnea    NORMAL SPIROMETRY 02/01/17 VISIT WITH DR [place]  . Hypertension   . Sleep apnea    FAMILY HISTORY History reviewed. No pertinent family history.  SOCIAL HISTORY  reports that  has never smoked. he has never used smokeless tobacco. He reports that he drinks alcohol. He reports that he does not use drugs.   HOME MEDICATIONS:  Current Meds  Medication Sig  . spironolactone (ALDACTONE) 100 MG tablet Take 100 mg by mouth daily.      HOSPITAL MEDICATIONS:  . aspirin  325 mg Per Tube Daily  . atorvastatin  40 mg Oral q1800  . Chlorhexidine Gluconate Cloth  6 each Topical Daily  . enoxaparin (LOVENOX) injection  40 mg Subcutaneous Q24H  . ezetimibe  10 mg Oral Daily  . insulin aspart  1-3 Units Subcutaneous Q4H  . mouth rinse  15 mL Mouth Rinse BID  . pantoprazole  40 mg Oral Daily  . sodium chloride flush  10-40 mL Intracatheter Q12H    OBJECTIVE Temp:  [98.2 F (36.8 C)-98.6 F (37 C)] 98.5 F (36.9 C) (01/21 1200) Pulse Rate:  [72-104] 86 (01/21 1200) Cardiac Rhythm: Normal sinus rhythm (01/21 1200) Resp:  [14-30] 22 (01/21 1300) BP: (131-155)/(70-108) 133/70 (01/21 1300) SpO2:  [95 %-100 %] 97 % (01/21 1200)  CBC:  Recent Labs  Lab 08/01/17 0433 08/02/17 0523  WBC 11.2* 9.9  HGB 9.8* 9.8*  HCT 30.2* 30.4*  MCV 92.4 92.7  PLT 235 163    Basic Metabolic Panel:  Recent Labs  Lab 08/01/17 0433  08/03/17 0503  08/05/17 0452 08/06/17 0450  NA 152*   < > 150*   < > 143 142  K 3.4*   < > 3.6  --   --  3.1*  CL 119*   < > 114*  --   --  109  CO2 25   <  > 25  --   --  25  GLUCOSE 157*   < > 116*  --   --  115*  BUN 17   < > 22*  --   --  14  CREATININE 0.78   < > 0.84  --   --  0.74  CALCIUM 8.2*   < > 8.5*  --   --  7.8*  MG 2.3  --  2.3  --   --   --   PHOS 1.8*  --  3.2  --   --   --    < > = values in this interval not displayed.    Lipid Panel:     Component Value Date/Time   CHOL 218 (H) 07/28/2017 0259   TRIG 316 (H) 07/30/2017 2323   HDL 28 (L) 07/28/2017 0259   CHOLHDL 7.8 07/28/2017 0259   VLDL UNABLE TO CALCULATE IF TRIGLYCERIDE OVER 400 mg/dL 07/28/2017 0259   LDLCALC UNABLE TO CALCULATE IF TRIGLYCERIDE OVER 400 mg/dL 07/28/2017 0259   HgbA1c:  Lab Results  Component Value Date   HGBA1C 5.2 07/28/2017  Urine Drug Screen:     Component Value Date/Time   LABOPIA NONE DETECTED 07/28/2017 0415   COCAINSCRNUR NONE DETECTED 07/28/2017 0415   LABBENZ NONE DETECTED 07/28/2017 0415   AMPHETMU NONE DETECTED 07/28/2017 0415   THCU NONE DETECTED 07/28/2017 0415   LABBARB NONE DETECTED 07/28/2017 0415    Alcohol Level No results found for: Rupert I have personally reviewed the radiological images below and agree with the radiology interpretations.  Ct Angio Head W Or Wo Contrast  Addendum Date: 07/27/2017   ADDENDUM REPORT: 07/27/2017 21:47 CONTRAST:  Total of 200 cc Omnipaque 370 administered. Electronically Signed   By: Kristine Garbe M.D.   On: 07/27/2017 21:47   Result Date: 07/27/2017 CLINICAL DATA:  42 y/o  M; right-sided numbness, acute stroke. EXAM: CT ANGIOGRAPHY HEAD AND NECK CT PERFUSION BRAIN TECHNIQUE: Multidetector CT imaging of the head and neck was performed using the standard protocol during bolus administration of intravenous contrast. Multiplanar CT image reconstructions and MIPs were obtained to evaluate the vascular anatomy. Carotid stenosis measurements (when applicable) are obtained utilizing NASCET criteria, using the distal internal carotid diameter as the denominator. Multiphase  CT imaging of the brain was performed following IV bolus contrast injection. Subsequent parametric perfusion maps were calculated using RAPID software. CONTRAST:  See addendum. COMPARISON:  07/27/2016 CT head FINDINGS: CTA NECK FINDINGS Aortic arch: Standard branching. Imaged portion shows no evidence of aneurysm or dissection. No significant stenosis of the major arch vessel origins. Right carotid system: No evidence of dissection, stenosis (50% or greater) or occlusion. Left carotid system: No evidence of dissection, stenosis (50% or greater) or occlusion. Vertebral arteries: Right dominant. No evidence of dissection, stenosis (50% or greater) or occlusion. Skeleton: Negative. Other neck: Nodules in bilateral lobes of thyroid measuring up to 2.6 cm on the right (series 7 image 149). Upper chest: Negative. Review of the MIP images confirms the above findings CTA HEAD FINDINGS Anterior circulation: Left proximal M1 occlusion with intermediate left MCA distribution collateralization. Otherwise no significant stenosis, proximal occlusion, aneurysm, or vascular malformation in the anterior circulation. Posterior circulation: No significant stenosis, proximal occlusion, aneurysm, or vascular malformation. Venous sinuses: As permitted by contrast timing, patent. Anatomic variants: Bilateral fetal PCA. Small anterior communicating artery. Review of the MIP images confirms the above findings CT Brain Perfusion Findings: CBF (<30%) Volume: 30m Perfusion (Tmax>6.0s) volume: 1543mMismatch Volume: 7547mnfarction Location:Left MCA distribution IMPRESSION: CTA head: 1. Left M1 occlusion with intermediate left MCA distribution collateralization. 2. Otherwise patent anterior and posterior intracranial circulation. No additional large vessel occlusion, aneurysm, or significant stenosis is identified. CTA neck: 1. Patent carotid and vertebral arteries. No dissection, aneurysm, or significant stenosis by NASCET criteria. 2.  Nodules in bilateral lobes of thyroid measuring up to 2.6 cm on the right. Thyroid ultrasound is recommended on a nonemergent basis. CT perfusion head: By automated RAPID quantification: Infarct core 82 cc, ischemic penumbra 75 cc, infarct location in left MCA distribution. These results were called by telephone at the time of interpretation on 07/27/2017 at 9:23 pm to Dr. ASHAmie Portlandwho verbally acknowledged these results. Electronically Signed: By: LanKristine GarbeD. On: 07/27/2017 21:39   Ct Head Wo Contrast  Result Date: 07/28/2017 CLINICAL DATA:  Follow-up suspected LEFT middle cerebral artery extravasation during incomplete thrombectomy. EXAM: CT HEAD WITHOUT CONTRAST TECHNIQUE: Contiguous axial images were obtained from the base of the skull through the vertex without intravenous contrast. COMPARISON:  Multiple CT HEAD May 27, 2018 FINDINGS: BRAIN:  Increased density LEFT MCA distribution extending into LEFT insula and LEFT frontoparietal sulci measuring to 91 Hounsfield units. Faint density interpeduncular cistern measuring 50 Hounsfield units. Confluent LEFT frontotemporal parietal hypodensity extending to LEFT basal ganglia. No convincing evidence of intraparenchymal hemorrhage. Regional LEFT cerebrum mass effect without midline shift. No hydrocephalus. Basal cisterns are patent. 1 cm cyst in caudal aspect splenium of corpus callosum versus pineal cyst, less likely. VASCULAR: Dense LEFT MCA (55 Hounsfield units). New density along anterior cerebral artery distribution. SKULL/SOFT TISSUES: No skull fracture. No significant soft tissue swelling. ORBITS/SINUSES: The included ocular globes and orbital contents are normal.Mild paranasal sinusitis. Life-support lines in place. Mastoid air cells are well aerated. OTHER: None. IMPRESSION: 1. Evolving acute large LEFT MCA territory infarct without hemorrhagic conversion. 2. Postprocedural extra-axial contrast extravasation in addition to  probable contrast staining and small volume subarachnoid hemorrhage. 3. New density anterior cerebral artery concerning for thromboembolism. Critical Value/emergent results were called by telephone at the time of interpretation on 07/28/2017 at 4:00 am to Dr. Amie Portland , who verbally acknowledged these results. Electronically Signed   By: Elon Alas M.D.   On: 07/28/2017 04:16   Ct Angio Neck W Or Wo Contrast  Addendum Date: 07/27/2017   ADDENDUM REPORT: 07/27/2017 21:47 CONTRAST:  Total of 200 cc Omnipaque 370 administered. Electronically Signed   By: Kristine Garbe M.D.   On: 07/27/2017 21:47   Result Date: 07/27/2017 CLINICAL DATA:  42 y/o  M; right-sided numbness, acute stroke. EXAM: CT ANGIOGRAPHY HEAD AND NECK CT PERFUSION BRAIN TECHNIQUE: Multidetector CT imaging of the head and neck was performed using the standard protocol during bolus administration of intravenous contrast. Multiplanar CT image reconstructions and MIPs were obtained to evaluate the vascular anatomy. Carotid stenosis measurements (when applicable) are obtained utilizing NASCET criteria, using the distal internal carotid diameter as the denominator. Multiphase CT imaging of the brain was performed following IV bolus contrast injection. Subsequent parametric perfusion maps were calculated using RAPID software. CONTRAST:  See addendum. COMPARISON:  07/27/2016 CT head FINDINGS: CTA NECK FINDINGS Aortic arch: Standard branching. Imaged portion shows no evidence of aneurysm or dissection. No significant stenosis of the major arch vessel origins. Right carotid system: No evidence of dissection, stenosis (50% or greater) or occlusion. Left carotid system: No evidence of dissection, stenosis (50% or greater) or occlusion. Vertebral arteries: Right dominant. No evidence of dissection, stenosis (50% or greater) or occlusion. Skeleton: Negative. Other neck: Nodules in bilateral lobes of thyroid measuring up to 2.6 cm on the  right (series 7 image 149). Upper chest: Negative. Review of the MIP images confirms the above findings CTA HEAD FINDINGS Anterior circulation: Left proximal M1 occlusion with intermediate left MCA distribution collateralization. Otherwise no significant stenosis, proximal occlusion, aneurysm, or vascular malformation in the anterior circulation. Posterior circulation: No significant stenosis, proximal occlusion, aneurysm, or vascular malformation. Venous sinuses: As permitted by contrast timing, patent. Anatomic variants: Bilateral fetal PCA. Small anterior communicating artery. Review of the MIP images confirms the above findings CT Brain Perfusion Findings: CBF (<30%) Volume: 75m Perfusion (Tmax>6.0s) volume: 1557mMismatch Volume: 7564mnfarction Location:Left MCA distribution IMPRESSION: CTA head: 1. Left M1 occlusion with intermediate left MCA distribution collateralization. 2. Otherwise patent anterior and posterior intracranial circulation. No additional large vessel occlusion, aneurysm, or significant stenosis is identified. CTA neck: 1. Patent carotid and vertebral arteries. No dissection, aneurysm, or significant stenosis by NASCET criteria. 2. Nodules in bilateral lobes of thyroid measuring up to 2.6 cm on the right. Thyroid  ultrasound is recommended on a nonemergent basis. CT perfusion head: By automated RAPID quantification: Infarct core 82 cc, ischemic penumbra 75 cc, infarct location in left MCA distribution. These results were called by telephone at the time of interpretation on 07/27/2017 at 9:23 pm to Dr. Amie Portland , who verbally acknowledged these results. Electronically Signed: By: Kristine Garbe M.D. On: 07/27/2017 21:39   Ct Cerebral Perfusion W Contrast  Addendum Date: 07/27/2017   ADDENDUM REPORT: 07/27/2017 21:47 CONTRAST:  Total of 200 cc Omnipaque 370 administered. Electronically Signed   By: Kristine Garbe M.D.   On: 07/27/2017 21:47   Result Date:  07/27/2017 CLINICAL DATA:  42 y/o  M; right-sided numbness, acute stroke. EXAM: CT ANGIOGRAPHY HEAD AND NECK CT PERFUSION BRAIN TECHNIQUE: Multidetector CT imaging of the head and neck was performed using the standard protocol during bolus administration of intravenous contrast. Multiplanar CT image reconstructions and MIPs were obtained to evaluate the vascular anatomy. Carotid stenosis measurements (when applicable) are obtained utilizing NASCET criteria, using the distal internal carotid diameter as the denominator. Multiphase CT imaging of the brain was performed following IV bolus contrast injection. Subsequent parametric perfusion maps were calculated using RAPID software. CONTRAST:  See addendum. COMPARISON:  07/27/2016 CT head FINDINGS: CTA NECK FINDINGS Aortic arch: Standard branching. Imaged portion shows no evidence of aneurysm or dissection. No significant stenosis of the major arch vessel origins. Right carotid system: No evidence of dissection, stenosis (50% or greater) or occlusion. Left carotid system: No evidence of dissection, stenosis (50% or greater) or occlusion. Vertebral arteries: Right dominant. No evidence of dissection, stenosis (50% or greater) or occlusion. Skeleton: Negative. Other neck: Nodules in bilateral lobes of thyroid measuring up to 2.6 cm on the right (series 7 image 149). Upper chest: Negative. Review of the MIP images confirms the above findings CTA HEAD FINDINGS Anterior circulation: Left proximal M1 occlusion with intermediate left MCA distribution collateralization. Otherwise no significant stenosis, proximal occlusion, aneurysm, or vascular malformation in the anterior circulation. Posterior circulation: No significant stenosis, proximal occlusion, aneurysm, or vascular malformation. Venous sinuses: As permitted by contrast timing, patent. Anatomic variants: Bilateral fetal PCA. Small anterior communicating artery. Review of the MIP images confirms the above findings CT  Brain Perfusion Findings: CBF (<30%) Volume: 58m Perfusion (Tmax>6.0s) volume: 1559mMismatch Volume: 7523mnfarction Location:Left MCA distribution IMPRESSION: CTA head: 1. Left M1 occlusion with intermediate left MCA distribution collateralization. 2. Otherwise patent anterior and posterior intracranial circulation. No additional large vessel occlusion, aneurysm, or significant stenosis is identified. CTA neck: 1. Patent carotid and vertebral arteries. No dissection, aneurysm, or significant stenosis by NASCET criteria. 2. Nodules in bilateral lobes of thyroid measuring up to 2.6 cm on the right. Thyroid ultrasound is recommended on a nonemergent basis. CT perfusion head: By automated RAPID quantification: Infarct core 82 cc, ischemic penumbra 75 cc, infarct location in left MCA distribution. These results were called by telephone at the time of interpretation on 07/27/2017 at 9:23 pm to Dr. ASHAmie Portlandwho verbally acknowledged these results. Electronically Signed: By: LanKristine GarbeD. On: 07/27/2017 21:39   Dg Chest Port 1 View  Result Date: 07/28/2017 CLINICAL DATA:  [age]ar old male status post intubation. EXAM: PORTABLE CHEST 1 VIEW COMPARISON:  None. FINDINGS: An enteric tube is seen with side-port in the region of the gastroesophageal junction and tip in the proximal stomach. Recommend further advancing of the tube into the stomach by approximately 3-4 cm. A partially visualized structure with tip at the level of  the T1 may represent the endotracheal tube. Clinical correlation is recommended. If an endotracheal tube is present recommend further advancing the tube by approximately 3 cm for optimal positioning. The lungs are clear. There is no pleural effusion or pneumothorax. The cardiac silhouette is within normal limits. No acute osseous pathology. IMPRESSION: 1. Enteric tube with tip in the stomach and side-port in the region of the gastroesophageal junction. Recommend advancing the  tube further into the stomach for optimal positioning. A partially visualized density over the T1 may represent the tip of the endotracheal tube. If an endotracheal tube is present recommend further advancing by approximately 3 cm for optimal positioning. 2. No acute cardiopulmonary process. Electronically Signed   By: Anner Crete M.D.   On: 07/28/2017 01:18   Ct Head Code Stroke Wo Contrast  Result Date: 07/27/2017 CLINICAL DATA:  Code stroke. 42 y/o M; right-sided weakness and facial droop. EXAM: CT HEAD WITHOUT CONTRAST TECHNIQUE: Contiguous axial images were obtained from the base of the skull through the vertex without intravenous contrast. COMPARISON:  None. FINDINGS: Brain: Faint lucency involving left insula and frontal operculum. No significant mass effect or hemorrhage at this time. No hydrocephalus, basilar cistern effacement, or extra-axial collection. Vascular: Dense left M1. Skull: Normal. Negative for fracture or focal lesion. Sinuses/Orbits: No acute finding. Other: None. ASPECTS [facility] Stroke Program Early CT Score) - Ganglionic level infarction (caudate, lentiform nuclei, internal capsule, insula, M1-M3 cortex): 5 - Supraganglionic infarction (M4-M6 cortex): 3 Total score (0-10 with 10 being normal): 8 IMPRESSION: 1. Left insula and frontal operculum lucency compatible with infarction. No hemorrhage or mass effect. 2. Left M1 hyperdensity, likely thrombus. 3. ASPECTS is 8 These results were called by telephone at the time of interpretation on 07/27/2017 at 9:10 pm to Dr. Rory Percy , who verbally acknowledged these results. Electronically Signed   By: Kristine Garbe M.D.   On: 07/27/2017 21:12   CT head 05/28/18 10am 1. Progressive left MCA territory infarct with further involvement of the left caudate head and lentiform nucleus. 2. Extensive nonhemorrhagic infarcts involving the left temporal tip, left frontal operculum, left parietal lobe, and medial left frontal lobe, the  distal left ACA territory. 3. Contrast extravasation compatible with perforation into the left sylvian fissure, the sulci over the convexity, and the interpeduncular notch. 4. Minimal midline shift. 5. Fluid in the sinuses likely secondary to intubation.  Transthoracic Echocardiogram - Left ventricle: The cavity size was normal. Wall thickness was   increased in a pattern of mild LVH. Systolic function was normal.   The estimated ejection fraction was in the range of 60% to 65%.   Wall motion was normal; there were no regional wall motion   abnormalities. Left ventricular diastolic function parameters   were normal. - Atrial septum: No defect or patent foramen ovale was identified.  Bilateral LE venous Dopplers - No evidence of deep vein thrombosis or baker's cysts bilaterally.   Ct Head Wo Contrast  Result Date: 07/29/2017 CLINICAL DATA:  Stroke. EXAM: CT HEAD WITHOUT CONTRAST TECHNIQUE: Contiguous axial images were obtained from the base of the skull through the vertex without intravenous contrast. COMPARISON:  MRI brain/12/19.  CT head 07/28/2017. FINDINGS: Brain: A large left MCA and ACA territory infarct is again noted. There is diffuse hypoattenuation involving affected areas of the left frontal and parietal lobe. The superior and anterior temporal lobe is involved. Contrast and hemorrhage within the sylvian fissure and along the left MCA is again noted. No new hemorrhage is present.  There is increasing mass effect with effacement of the left lateral ventricle and now 10 mm of midline shift. There is increasing prominence of the right temporal horn compatible with mass effect on the lateral ventricles. Effacement of the para mesencephalic cistern on the right suggest developing uncal herniation. Brainstem and cerebellum are unremarkable. Vascular: Hyperdense left MCA and branches compatible with contrast and thrombus. Skull: Calvarium is intact. No focal lytic or blastic lesions are  present. Sinuses/Orbits: Increased fluid is present in the left sphenoid sinus. There is mild mucosal thickening of the left maxillary sinus. Globes and orbits are within normal limits. IMPRESSION: 1. Evolving left MCA and ACA territory infarct increasing mass effect and midline shift, now measuring 10 mm. 2. Mild increasing dilation of the right temporal tip compatible with left-sided effacement. 3. Developing uncal herniation without significant downward herniation of the brainstem. 4. Subarachnoid contrast and hemorrhage again noted within the sylvian fissure. Electronically Signed   By: San Morelle M.D.   On: 07/29/2017 06:40   Mr Brain Wo Contrast  Result Date: 07/28/2017 CLINICAL DATA:  Follow-up LEFT-sided stroke. Status post tPA July 27, 2017. EXAM: MRI HEAD WITHOUT CONTRAST TECHNIQUE: Multiplanar, multiecho pulse sequences of the brain and surrounding structures were obtained without intravenous contrast. COMPARISON:  CT HEAD July 28, 2017 at 0852 hours FINDINGS: BRAIN: Confluent LEFT frontotemporal parietal and LEFT basal ganglia reduced diffusion with involvement of the mesial LEFT parietal and frontal lobes. Low ADC values. Susceptibility artifact within LEFT sylvian fissure and LEFT frontal parietal sulci. No intraparenchymal hemorrhage. Worsening 8 mm LEFT-to-RIGHT midline shift, increased from 2 mm. Partially effaced LEFT lateral ventricle with early RIGHT lateral ventricle entrapment. 11 mm T2 bright simple cyst within or adjacent to splenium of corpus callosum. No significant extra-axial fluid accumulation. VASCULAR: Normal major intracranial vascular flow voids present at skull base. SKULL AND UPPER CERVICAL SPINE: No abnormal sellar expansion. No suspicious calvarial bone marrow signal. Craniocervical junction maintained. SINUSES/ORBITS: Secretions layering in scratches at layering secretions in paranasal sinuses. Life-support lines in place. Mastoid air cells are well aerated.  The included ocular globes and orbital contents are non-suspicious. OTHER: None. IMPRESSION: 1. Evolving acute large LEFT MCA territory nonhemorrhagic infarct. Evolving acute distal LEFT ACA territory nonhemorrhagic infarct. 2. Worsening 8 mm LEFT-to-RIGHT midline shift with early RIGHT ventricle entrapment. 3. Susceptibility artifact LEFT sylvian fissure and LEFT sulci corresponding to known extra-axial hemorrhage. Acute findings text paged to [place] via AMION secure system on 07/28/2017 at 10:37 pm. Electronically Signed   By: Elon Alas M.D.   On: 07/28/2017 22:38   Ct Head Wo Contrast  Result Date: 07/31/2017 CLINICAL DATA:  Stroke, follow-up craniotomy. EXAM: CT HEAD WITHOUT CONTRAST TECHNIQUE: Contiguous axial images were obtained from the base of the skull through the vertex without intravenous contrast. COMPARISON:  CT HEAD July 29, 2017 and MRI of the head July 28, 2017 FINDINGS: BRAIN: Mild external herniation LEFT cerebrum via craniectomy defect. Evolving large LEFT acute to subacute MCA territory infarct without hemorrhagic conversion. Moderate LEFT frontoparietal ACA territory infarct. Regional mass effect without midline shift. Persistent partial LEFT lateral ventricle effacement. Resolving RIGHT ventricular entrapment. Mild residual LEFT uncal herniation. Patent basal cisterns. VASCULAR: Dense LEFT MCA consistent with thromboembolism and, potentially trapped extravasated contrast. Focally dense LEFT ACA. SKULL/SOFT TISSUES: Interval LEFT decompressive craniectomy. LEFT scalp soft tissue swelling, subcutaneous gas with overlying skin staples. ORBITS/SINUSES: The included ocular globes and orbital contents are normal.Mild paranasal sinus mucosal thickening. LEFT sphenoid sinus air-fluid level. Mastoid  air cells are well aerated. OTHER: Life-support lines in place. IMPRESSION: 1. Evolving large LEFT MCA and moderate LEFT ACA territory infarct without hemorrhagic conversion. 2.  Interval decompressive LEFT craniectomy with mild external herniation of LEFT cerebrum. Resolution of midline shift. 3. Resolving RIGHT ventricular entrapment. Electronically Signed   By: Elon Alas M.D.   On: 07/31/2017 04:26   Ct Head Wo Contrast  Result Date: 08/02/2017 CLINICAL DATA:  42 y/o  M; left MCA and ACA infarcts for follow-up. EXAM: CT HEAD WITHOUT CONTRAST TECHNIQUE: Contiguous axial images were obtained from the base of the skull through the vertex without intravenous contrast. COMPARISON:  07/31/2016 CT head FINDINGS: Brain: Stable distribution of left MCA and ACA infarctions. Stable edema with herniation of the left cerebral convexity with large craniectomy defect, effacement of left lateral ventricle, left uncal herniation, and 4 mm left-to-right midline shift. No downward herniation identified. Stable right ventricle size. No new stroke. No interval hemorrhage. Vascular: Hyperdensity within the left M1 and distal vessels likely representing thrombus. Skull: Postsurgical changes related to left hemi craniectomy stable edema in the overlying scalp and skin staples. Sinuses/Orbits: Sphenoid sinus opacification with fluid levels, partial opacification of left mastoid air cells, and left maxillary sinus mucosal thickening, probably due to intubation. Orbits are unremarkable. Other: None. IMPRESSION: 1. Stable distribution of left MCA and ACA infarctions. No new infarction or hemorrhage. 2. Stable mass effect with 4 mm left-to-right midline shift and mild herniation via craniectomy. Electronically Signed   By: Kristine Garbe M.D.   On: 08/02/2017 05:06     PHYSICAL EXAM  Temp:  [98.2 F (36.8 C)-98.6 F (37 C)] 98.5 F (36.9 C) (01/21 1200) Pulse Rate:  [72-104] 86 (01/21 1200) Resp:  [14-30] 22 (01/21 1300) BP: (131-155)/(70-108) 133/70 (01/21 1300) SpO2:  [95 %-100 %] 97 % (01/21 1200)  General - Well nourished, well developed, intubated.  Ophthalmologic - Fundi  not visualized due to eye movement.  Cardiovascular - Regular rate and rhythm.  Neuro - drowsy but opens eyes to command, left eyelid and scalp swelling,  speech is soft and can be understood with difficulty. Able to speak sentences. Able to name and repeat. No aphasia. PERRL, left gaze preference, barely cross midline, blinking to visual threat on the left, but not able to the left.  Able to follow all simple commands.  Facial asymmetry with right lower facial mild weakness .  Left upper extremity and lower extremity at least 4+/5, right upper and lower extremity dense hemiplegia.  DTR diminished on the right, no Babinski.  Sensation and coordination not cooperative.  Gait not tested.   ASSESSMENT/PLAN [name] is a 42 y.o. male with history of HTN, OSA presenting with right-sided weakness, left gaze and right neglect. He receive IV t-PA.  However, unsuccessful for mechanical thrombectomy with complication of left SAH.  Stroke: Left large MCA infarct status post TPA with unsuccessful IR attempt causing left SAH.  Resultant  left gaze, right hemiplegia, right neglect  CT head left MCA  hyperdense  CT head and neck left MCA occlusion  DSA with unsuccessful IR attempt causing left SAH  CT repeat showed left MCA large infarct with left stable SAH and stable subtle midline shift  MRI head large left MCA and ACA infarct with midline shift  Repeat CT 07/31/17 - much improved midline shift after hemicrani  2D Echo - EF 60-65%  LE venous Doppler no DVT   TEE attempted 08/06/17 but unable to perform  LDL  NTC due to high TG  HgbA1c - 5.2  UDS negative  VTE prophylaxis -SCDs Fall precautions DIET - DYS 1 Room service appropriate? Yes; Fluid consistency: Nectar Thick  No antithrombotic prior to admission, now on ASA 366m.   Ongoing aggressive stroke risk factor management  Therapy recommendations:  CIR  Disposition:  CLR  Cerebral edema s/p decompressive hemicrani  CT head  showed large left MCA infarct  MRI large left MCA and ACA infarcts with midline shift  CT repeat - increasing midline shift  CT repeat 07/31/17 - mild midline shift  Repeat CT 08/02/17 - stable midline shift  3% saline discontinued 08/05/17   Na 144->150->153->152->153->150  Sodium goal 150 - 155  Neurosurgery on board - s/p hemi craniectomy   Fever - likely related to surgery, resolved  Afebrile for the last 24 hours  On temperature control protocol PRN  Off arctic sun  WBC 11.2->10.6->11.2->9.9  Hypertension  Stable but elevated than before  Likely related to increased ICP  Labetalol IV PRN  [facility] < [facility]  Home meds: None  LDL NTC due to high TG, goal < 70  TG 417  On zetia and lipitor  Continue on discharge  B12 deficiency  B12 = 161   B12 im supplement daily x 7 days  Other Stroke Risk Factors  Obstructive sleep apnea  Other Active Problems  TSH mildly low but FT4 normal  Elevated ESR and rheumatoid factor-unclear significance  Hospital day # 10  This patient is critically ill due to large left MCA infarct, SAH, cerebral edema and at significant risk of neurological worsening, death form brain herniation, seizure, heart failure. This patient's care requires constant monitoring of vital signs, hemodynamics, respiratory and cardiac monitoring, review of multiple databases, neurological assessment, discussion with family, other specialists and medical decision making of high complexity. . [facility] Family not available at the bedside for discussion. Likely transfer to inpatient rehabilitation over the next few days.  Greater than 50% time during this 25 minute visit was spent on counseling and coordination of care about his stroke and answering questions PAntony Contras MD Stroke Neurology 08/06/2017 2:08 PM   To contact Stroke Continuity provider, please refer to  [email or web address] After hours, contact General Neurology

## 2017-08-06 NOTE — Care Management Note (Signed)
Case Management Note  Patient Details  Name: Jeremy [name] MRN: [id number] Date of Birth: Nov 22, 1975  Subjective/Objective:   42 yo admitted with right sided weakness.  Found to have LT MCA CVA s/p TPA with partial revascularization.  PTA, pt resides at home with spouse.                    Action/Plan: PT/OT recommending CIR; possible admission to rehab tomorrow, per admission coordinator's notes.  Will follow progress.    Expected Discharge Date:                  Expected Discharge Plan:  IP Rehab Facility  In-House Referral:     Discharge planning Services  CM Consult  Post Acute Care Choice:    Choice offered to:     DME Arranged:    DME Agency:     HH Arranged:    HH Agency:     Status of Service:  In process, will continue to follow  If discussed at Long Length of Stay Meetings, dates discussed:    Additional Comments:  Glennon Macmerson, Nikolette Reindl M, RN 08/06/2017, 4:34 PM

## 2017-08-06 NOTE — Progress Notes (Signed)
Inpatient Rehabilitation  Met with patient and spouse at bedside to further discuss IP Rehab program.  Plan to work toward admission to [facility] Tuesday 08/07/17.  Will continue to follow for timing of medical readiness and insurance authorization.  Call if questions.   Carmelia Roller., CCC/SLP Admission Coordinator  [place]  Cell [phone]

## 2017-08-06 NOTE — Progress Notes (Signed)
Occupational Therapy Treatment Patient Details Name: Jeremy GashJao [name] MRN: [id number] DOB: September 09, 1975 Today's Date: 08/06/2017    History of present illness [age] yo admitted with right sided weakness with Left MCA CVA s/p tPA with partial revascularization with crani and bone flap and tPA reversal. Pt intubated 1/11-1/17. PMhx: HTn, sleep apnea, depression   OT comments  Pt progressing towards OT goals. Pt performed peri care with Max A for sitting balance at EOB and Max cues for crossing midline with L hand and attending to R side. Pt washed his hands with Mod A while seated in recliner and Max cues to attend to R hand. Pt requiring support at R elbow and wrist to bring R hand to midline. Pt requires Max A +2 for sit<>stand. Continue to recommend CIR and will continue to follow acutely as admitted.    Follow Up Recommendations  CIR;Supervision/Assistance - 24 hour    Equipment Recommendations  Other (comment)(Defer to next venue)    Recommendations for Other Services PT consult;Rehab consult;Speech consult    Precautions / Restrictions Precautions Precautions: Fall Precaution Comments: no bone flap on the L, in abdomen Restrictions Weight Bearing Restrictions: No       Mobility Bed Mobility Overal bed mobility: Needs Assistance Bed Mobility: Rolling;Sidelying to Sit Rolling: Mod assist;+2 for physical assistance Sidelying to sit: Max assist;+2 for physical assistance       General bed mobility comments: worked on using L LE to push R LE off EOB  Transfers Overall transfer level: Needs assistance Equipment used: None(2 person lift with gait belt and bed pad) Transfers: Sit to/from [employer] CorporationStand;Stand Pivot Transfers Sit to Stand: Mod assist;+2 physical assistance Stand pivot transfers: Max assist;+2 physical assistance       General transfer comment: pt unable to advance  RLE, R Knee blocked during advancement of L LE due to buckling of R KNEE. max tactile cues for weight-shifting.  unable to use R UE functionally    Balance Overall balance assessment: Needs assistance Sitting-balance support: Feet supported;Bilateral upper extremity supported Sitting balance-Leahy Scale: Poor Sitting balance - Comments: pt with R UE place in WBing position, min/modA to maintain R UE position. worked on finding and maintaining midline. Pt requires minA to maintain upright position with max cues to contract abdominal muscles and "pinch shoulders together" Pt requires mod/maxA majority of time, otherise pt falls posteriorly Postural control: Posterior lean Standing balance support: Bilateral upper extremity supported;During functional activity Standing balance-Leahy Scale: Poor Standing balance comment: mod +2 for balance in standing                           ADL either performed or assessed with clinical judgement   ADL Overall ADL's : Needs assistance/impaired     Grooming: Wash/dry hands;Sitting Grooming Details (indicate cue type and reason): Pt washing hands with Mod A for managing and facilitate bilateral coordination. Pt requiring support at R elbow and wrist to bring RUE to midline. Pt requiring Max cues to attend to RUE and then rub hands towards.      Lower Body Bathing: Maximal assistance;+2 for safety/equipment;Cueing for sequencing Lower Body Bathing Details (indicate cue type and reason): Max A for sitting balance at EOB and perform peri care. Pt requiring Max cues to attend to R side and cross midline to pick up wash cloth with L hand and place back on right side. Providing tactile cues and facilitation of WBing through RUE on EOB during task.  Functional mobility during ADLs: Maximal assistance;+2 for physical assistance;Cueing for sequencing(SPT) General ADL Comments: Pt continues to demostrate R inattention and required Max A for attending to R side.      Vision   Additional Comments: Will continue to assess   Perception      Praxis      Cognition Arousal/Alertness: Awake/alert Behavior During Therapy: Flat affect Overall Cognitive Status: Impaired/Different from baseline Area of Impairment: Attention;Problem solving;Awareness;Following commands                   Current Attention Level: Selective   Following Commands: Follows one step commands consistently;Follows multi-step commands with increased time;Follows one step commands with increased time Safety/Judgement: Decreased awareness of deficits;Decreased awareness of safety(R sided inattention) Awareness: Emergent Problem Solving: Slow processing;Decreased initiation;Difficulty sequencing;Requires verbal cues;Requires tactile cues General Comments: Pt requiring increased time and cues throughout session. Pt requiring Max cues to attend to R side of body and environment.        Exercises     Shoulder Instructions       General Comments Wife present throughout session. Providing wife and pt with education on edema management and RUE management. Wife verbalized understanding. Also providing education on R inattention and standing on R side of pt.     Pertinent Vitals/ Pain       Pain Assessment: No/denies pain  Home Living                                          Prior Functioning/Environment              Frequency  Min 3X/week        Progress Toward Goals  OT Goals(current goals can now be found in the care plan section)  Progress towards OT goals: Progressing toward goals  Acute Rehab OT Goals Patient Stated Goal: return to work and home OT Goal Formulation: With patient Time For Goal Achievement: 08/18/17 Potential to Achieve Goals: Good ADL Goals Pt Will Perform Grooming: with min assist;sitting Pt Will Perform Upper Body Dressing: with min assist;sitting Pt Will Transfer to Toilet: with min assist;with +2 assist;bedside commode;stand pivot transfer Additional ADL Goal #1: Pt will attend to 75% of  objects on R side during ADLs. Additional ADL Goal #2: Pt will demonstrate three edema management strategies with 1-2 VCs Additional ADL Goal #3: Pt will use RUE as dependent stabilizer during ADLs Additional ADL Goal #4: Pt will perform bed mobility with Min A +2 for safety  Plan Discharge plan remains appropriate    Co-evaluation    PT/OT/SLP Co-Evaluation/Treatment: Yes Reason for Co-Treatment: Complexity of the patient's impairments (multi-system involvement)   OT goals addressed during session: ADL's and self-care      AM-PAC PT "6 Clicks" Daily Activity     Outcome Measure   Help from another person eating meals?: A Lot Help from another person taking care of personal grooming?: A Lot Help from another person toileting, which includes using toliet, bedpan, or urinal?: A Lot Help from another person bathing (including washing, rinsing, drying)?: A Lot Help from another person to put on and taking off regular upper body clothing?: A Lot Help from another person to put on and taking off regular lower body clothing?: A Lot 6 Click Score: 12    End of Session Equipment Utilized During Treatment: Gait belt  OT Visit Diagnosis: Unsteadiness on  feet (R26.81);Other abnormalities of gait and mobility (R26.89);Muscle weakness (generalized) (M62.81);Other symptoms and signs involving cognitive function;Hemiplegia and hemiparesis;Pain Hemiplegia - Right/Left: Right Hemiplegia - dominant/non-dominant: Non-Dominant Hemiplegia - caused by: Cerebral infarction   Activity Tolerance Patient tolerated treatment well   Patient Left in chair;with call bell/phone within reach;with family/visitor present   Nurse Communication Mobility status        Time: 1610-9604 OT Time Calculation (min): 33 min  Charges: OT General Charges $OT Visit: 1 Visit OT Treatments $Self Care/Home Management : 8-22 mins  Deverick Pruss MSOT, OTR/L Acute Rehab Pager: [phone] Office:  [phone]   Theodoro Grist Adenike Shidler 08/06/2017, 5:24 PM

## 2017-08-06 NOTE — Significant Event (Signed)
Dr. Pearlean BrownieSethi gave verbal orders to RN that patient can have 40mEq of potassium replacement and removal of central line on right chest. This RN unable to implement these orders as patient went down for TEE this morning and did not come back to unit until around 11am. Handed off report to receiving RN in Neuro ICU at 11am, to return to home unit. Marland Kitchen.     Jeremy [name]

## 2017-08-06 NOTE — Anesthesia Preprocedure Evaluation (Addendum)
Anesthesia Evaluation  Patient identified by MRN, date of birth, ID band Patient unresponsive    Reviewed: Allergy & Precautions, H&P , NPO status , Patient's Chart, lab work & pertinent test results  Airway Mallampati: Intubated  TM Distance: >3 FB Neck ROM: Full   Comment: R sided facial droop Dental no notable dental hx. (+) Teeth Intact, Dental Advisory Given   Pulmonary asthma , sleep apnea ,  Intubated and sedated   Pulmonary exam normal breath sounds clear to auscultation       Cardiovascular hypertension, Pt. on medications negative cardio ROS   Rhythm:Regular Rate:Normal     Neuro/Psych Anxiety Depression Intracranial HTN CVA, Residual Symptoms    GI/Hepatic negative GI ROS, Neg liver ROS,   Endo/Other  negative endocrine ROS  Renal/GU negative Renal ROS  negative genitourinary   Musculoskeletal negative musculoskeletal ROS (+)   Abdominal   Peds  Hematology  (+) REFUSES BLOOD PRODUCTS,   Anesthesia Other Findings   Reproductive/Obstetrics                            Lab Results  Component Value Date   WBC 9.9 08/02/2017   HGB 9.8 (L) 08/02/2017   HCT 30.4 (L) 08/02/2017   MCV 92.7 08/02/2017   PLT 292 08/02/2017   Lab Results  Component Value Date   CREATININE 0.74 08/06/2017   BUN 14 08/06/2017   NA 142 08/06/2017   K 3.1 (L) 08/06/2017   CL 109 08/06/2017   CO2 25 08/06/2017    Anesthesia Physical  Anesthesia Plan  ASA: IV  Anesthesia Plan: MAC   Post-op Pain Management:    Induction: Intravenous  PONV Risk Score and Plan: 3 and Ondansetron, Dexamethasone and Midazolam  Airway Management Planned:   Additional Equipment:   Intra-op Plan:   Post-operative Plan:   Informed Consent: I have reviewed the patients History and Physical, chart, labs and discussed the procedure including the risks, benefits and alternatives for the proposed anesthesia with  the patient or authorized representative who has indicated his/her understanding and acceptance.   Dental advisory given  Plan Discussed with: CRNA  Anesthesia Plan Comments:        Anesthesia Quick Evaluation

## 2017-08-06 NOTE — Transfer of Care (Signed)
Immediate Anesthesia Transfer of Care Note  Patient: Shain Uno  Procedure(s) Performed: ABORTED TEE  Patient Location: Endoscopy Unit  Anesthesia Type:MAC  Level of Consciousness: awake and alert   Airway & Oxygen Therapy: Patient Spontanous Breathing and Patient connected to nasal cannula oxygen  Post-op Assessment: Report given to RN and Post -op Vital signs reviewed and stable  Post vital signs: Reviewed and stable  Last Vitals:  Vitals:   08/06/17 0800 08/06/17 0811  BP: (!) 143/94 (!) 147/99  Pulse: 72 82  Resp: 19 17  Temp:  36.8 C  SpO2: 97% 98%    Last Pain:  Vitals:   08/06/17 0811  TempSrc: Oral  PainSc:       Patients Stated Pain Goal: 0 (38/25/05 3976)  Complications: No apparent anesthesia complications

## 2017-08-06 NOTE — Anesthesia Postprocedure Evaluation (Signed)
Anesthesia Post Note  Patient: Jeremy [name]  Procedure(s) Performed: ABORTED TEE     Patient location during evaluation: Endoscopy Anesthesia Type: General    Last Vitals:  Vitals:   08/06/17 0811 08/06/17 0921  BP: (!) 147/99 139/90  Pulse: 82 72  Resp: 17 14  Temp: 36.8 C 36.9 C  SpO2: 98% 99%    Last Pain:  Vitals:   08/06/17 0921  TempSrc: Oral  PainSc:                  Trevor IhaStephen A Amulya Quintin

## 2017-08-06 NOTE — Anesthesia Postprocedure Evaluation (Addendum)
Anesthesia Post Note  Patient: Jeremy [name]  Procedure(s) Performed: ABORTED TEE     Patient location during evaluation: Endoscopy Anesthesia Type: MAC Level of consciousness: awake and alert Pain management: pain level controlled Vital Signs Assessment: post-procedure vital signs reviewed and stable Respiratory status: spontaneous breathing, nonlabored ventilation, respiratory function stable and patient connected to nasal cannula oxygen Cardiovascular status: stable and blood pressure returned to baseline Postop Assessment: no apparent nausea or vomiting Anesthetic complications: no    Last Vitals:  Vitals:   08/06/17 0811 08/06/17 0921  BP: (!) 147/99 139/90  Pulse: 82 72  Resp: 17 14  Temp: 36.8 C 36.9 C  SpO2: 98% 99%    Last Pain:  Vitals:   08/06/17 0921  TempSrc: Oral  PainSc:                  Barnet Glasgow

## 2017-08-06 NOTE — Interval H&P Note (Signed)
History and Physical Interval Note:  08/06/2017 8:56 AM  Jeremy [name]  has presented today for surgery, with the diagnosis of STROKE  The various methods of treatment have been discussed with the patient and family. After consideration of risks, benefits and other options for treatment, the patient has consented to  Procedure(s): TRANSESOPHAGEAL ECHOCARDIOGRAM (TEE) (N/A) as a surgical intervention .  The patient's history has been reviewed, patient examined, no change in status, stable for surgery.  I have reviewed the patient's chart and labs.  Questions were answered to the patient's satisfaction.     Armanda Magicraci Zacaria Pousson

## 2017-08-06 NOTE — Progress Notes (Signed)
EP service was called to evaluate for loop implant, typically done day of discharge. Patient remains in neuro ICU, he does not appear planned for discharge today.  Please re-call EP service if evaluation for loop implant is needed once ready for discharge.   Tank you Francis Dowseenee Ursuy, PA-C

## 2017-08-06 NOTE — Progress Notes (Signed)
Physical Therapy Treatment Patient Details Name: Jeremy [name] MRN: [id number] DOB: [birth year] Today's Date: 08/06/2017    History of Present Illness 42 yo admitted with right sided weakness with Left MCA CVA s/p tPA with partial revascularization with crani and bone flap and tPA reversal. Pt intubated 1/11-1/17. PMhx: HTn, sleep apnea, depression    PT Comments    Pt remains to have dense R hemi-plegia, R sided inattention, impaired attention, impaired sequencing and depressed spirits. Emphasis on seated balance and increasing WBing through R LE in sitting and standing to promote muscle stimulation/contraction. Pt con't to require modAx2 for all mobility. Con't to recommend CIR upon d/c upon d/c for maximal functional recovery.    Follow Up Recommendations  CIR;Supervision/Assistance - 24 hour     Equipment Recommendations       Recommendations for Other Services Rehab consult     Precautions / Restrictions Precautions Precautions: Fall Precaution Comments: no bone flap on the L, in abdomen Restrictions Weight Bearing Restrictions: No    Mobility  Bed Mobility Overal bed mobility: Needs Assistance Bed Mobility: Rolling;Sidelying to Sit Rolling: Mod assist;+2 for physical assistance Sidelying to sit: Max assist;+2 for physical assistance       General bed mobility comments: worked on using L LE to push R LE off EOB  Transfers Overall transfer level: Needs assistance Equipment used: None(2 person lift with gait belt and bed pad) Transfers: Sit to/from [employer]Stand;Stand Pivot Transfers Sit to Stand: Mod assist;+2 physical assistance Stand pivot transfers: Max assist;+2 physical assistance       General transfer comment: pt unable to advance  RLE, R Knee blocked during advancement of L LE due to buckling of R KNEE. max tactile cues for weight-shifting. unable to use R UE functionally  Ambulation/Gait             General Gait Details: unable   Stairs             Wheelchair Mobility    Modified Rankin (Stroke Patients Only) Modified Rankin (Stroke Patients Only) Pre-Morbid Rankin Score: No symptoms Modified Rankin: Severe disability     Balance Overall balance assessment: Needs assistance Sitting-balance support: Feet supported;Bilateral upper extremity supported Sitting balance-Leahy Scale: Poor Sitting balance - Comments: pt with R UE place in WBing position, min/modA to maintain R UE position. worked on finding and maintaining midline. Pt requires minA to maintain upright position with max cues to contract abdominal muscles and "pinch shoulders together" Pt requires mod/maxA majority of time, otherise pt falls posteriorly Postural control: Posterior lean Standing balance support: Bilateral upper extremity supported;During functional activity Standing balance-Leahy Scale: Poor Standing balance comment: mod +2 for balance in standing                            Cognition Arousal/Alertness: Awake/alert Behavior During Therapy: Flat affect Overall Cognitive Status: Impaired/Different from baseline Area of Impairment: Attention;Problem solving;Awareness                   Current Attention Level: Selective   Following Commands: Follows one step commands consistently;Follows multi-step commands with increased time Safety/Judgement: Decreased awareness of deficits;Decreased awareness of safety(R sided inattention)   Problem Solving: Slow processing;Decreased initiation;Difficulty sequencing;Requires verbal cues;Requires tactile cues General Comments: v/c's for compensation techniques wiht using L LE to aide R LE. compensation for turning head to the R      Exercises      General Comments  Pertinent Vitals/Pain Pain Assessment: No/denies pain    Home Living                      Prior Function            PT Goals (current goals can now be found in the care plan section) Progress towards PT  goals: Progressing toward goals    Frequency    Min 4X/week      PT Plan Current plan remains appropriate    Co-evaluation PT/OT/SLP Co-Evaluation/Treatment: Yes Reason for Co-Treatment: Complexity of the patient's impairments (multi-system involvement) PT goals addressed during session: Mobility/safety with mobility        AM-PAC PT "6 Clicks" Daily Activity  Outcome Measure  Difficulty turning over in bed (including adjusting bedclothes, sheets and blankets)?: Unable Difficulty moving from lying on back to sitting on the side of the bed? : Unable Difficulty sitting down on and standing up from a chair with arms (e.g., wheelchair, bedside commode, etc,.)?: Unable Help needed moving to and from a bed to chair (including a wheelchair)?: A Lot Help needed walking in hospital room?: Total Help needed climbing 3-5 steps with a railing? : Total 6 Click Score: 7    End of Session Equipment Utilized During Treatment: Gait belt Activity Tolerance: Patient tolerated treatment well Patient left: in chair;with call bell/phone within reach;with chair alarm set Nurse Communication: Mobility status;Precautions PT Visit Diagnosis: Other abnormalities of gait and mobility (R26.89);Hemiplegia and hemiparesis;Other symptoms and signs involving the nervous system (R29.898) Hemiplegia - Right/Left: Right Hemiplegia - dominant/non-dominant: Non-dominant Hemiplegia - caused by: Cerebral infarction     Time: 1610-9604 PT Time Calculation (min) (ACUTE ONLY): 36 min  Charges:  $Therapeutic Activity: 8-22 mins $Neuromuscular Re-education: 8-22 mins                    G Codes:       [name], PT, DPT Pager #: [phone] Office #: [phone]    Symir Mah M Johnatan Baskette 08/06/2017, 12:35 PM

## 2017-08-06 NOTE — Progress Notes (Signed)
OT Cancellation    08/06/17 0800  OT Visit Information  Last OT Received On 08/06/17  Reason Eval/Treat Not Completed Patient at procedure or test/ unavailable (Off the floor for Endo. Will return as schedule allows)   Curlene Dolphinharis Sharri Loya MSOT, OTR/L Acute Rehab Pager: [phone] Office: [phone]

## 2017-08-06 NOTE — CV Procedure (Signed)
    PROCEDURE NOTE:  Procedure:  Transesophageal echocardiogram Operator:  Armanda Magicraci Turner, MD Indications:  CVA Complications: None  During this procedure the patient is administered a total of Proporol 30 mg to achieve and maintain moderate conscious sedation.  The patient's heart rate, blood pressure, and oxygen saturation are monitored continuously during the procedure by anesthesia.  Patient unable to open mouth wide enough to get bite block in .  Unable to turn patient's head due to recent neurosurgery.  Case cancelled.  Signed: Armanda Magicraci Turner, MD [facility]

## 2017-08-06 NOTE — Anesthesia Procedure Notes (Signed)
Procedure Name: MAC Date/Time: 08/06/2017 8:59 AM Performed by: Candis Shine, CRNA Pre-anesthesia Checklist: Patient identified, Emergency Drugs available, Suction available, Patient being monitored and Timeout performed Patient Re-evaluated:Patient Re-evaluated prior to induction Oxygen Delivery Method: Nasal cannula Dental Injury: Teeth and Oropharynx as per pre-operative assessment

## 2017-08-07 ENCOUNTER — Encounter (HOSPITAL_COMMUNITY): Payer: Self-pay | Admitting: Cardiology

## 2017-08-07 ENCOUNTER — Inpatient Hospital Stay (HOSPITAL_COMMUNITY)
Admission: RE | Admit: 2017-08-07 | Discharge: 2017-09-07 | DRG: 057 | Disposition: A | Discharge status: Home / Self Care | Payer: BLUE CROSS/BLUE SHIELD | Source: Intra-hospital | Attending: Physical Medicine & Rehabilitation | Admitting: Physical Medicine & Rehabilitation

## 2017-08-07 DIAGNOSIS — I63512 Cerebral infarction due to unspecified occlusion or stenosis of left middle cerebral artery: Secondary | ICD-10-CM

## 2017-08-07 DIAGNOSIS — I10 Essential (primary) hypertension: Secondary | ICD-10-CM

## 2017-08-07 DIAGNOSIS — I69391 Dysphagia following cerebral infarction: Secondary | ICD-10-CM | POA: Diagnosis not present

## 2017-08-07 DIAGNOSIS — I639 Cerebral infarction, unspecified: Secondary | ICD-10-CM

## 2017-08-07 DIAGNOSIS — I69314 Frontal lobe and executive function deficit following cerebral infarction: Secondary | ICD-10-CM

## 2017-08-07 DIAGNOSIS — Z9889 Other specified postprocedural states: Secondary | ICD-10-CM

## 2017-08-07 DIAGNOSIS — R1310 Dysphagia, unspecified: Secondary | ICD-10-CM | POA: Diagnosis present

## 2017-08-07 DIAGNOSIS — F4323 Adjustment disorder with mixed anxiety and depressed mood: Secondary | ICD-10-CM

## 2017-08-07 DIAGNOSIS — Z79899 Other long term (current) drug therapy: Secondary | ICD-10-CM | POA: Diagnosis not present

## 2017-08-07 DIAGNOSIS — E785 Hyperlipidemia, unspecified: Secondary | ICD-10-CM | POA: Diagnosis present

## 2017-08-07 DIAGNOSIS — R112 Nausea with vomiting, unspecified: Secondary | ICD-10-CM

## 2017-08-07 DIAGNOSIS — D62 Acute posthemorrhagic anemia: Secondary | ICD-10-CM

## 2017-08-07 DIAGNOSIS — I69320 Aphasia following cerebral infarction: Secondary | ICD-10-CM

## 2017-08-07 DIAGNOSIS — R4701 Aphasia: Secondary | ICD-10-CM | POA: Diagnosis not present

## 2017-08-07 DIAGNOSIS — I69351 Hemiplegia and hemiparesis following cerebral infarction affecting right dominant side: Secondary | ICD-10-CM | POA: Diagnosis present

## 2017-08-07 DIAGNOSIS — G441 Vascular headache, not elsewhere classified: Secondary | ICD-10-CM

## 2017-08-07 DIAGNOSIS — E782 Mixed hyperlipidemia: Secondary | ICD-10-CM | POA: Diagnosis not present

## 2017-08-07 DIAGNOSIS — I69919 Unspecified symptoms and signs involving cognitive functions following unspecified cerebrovascular disease: Secondary | ICD-10-CM | POA: Diagnosis not present

## 2017-08-07 DIAGNOSIS — I6350 Cerebral infarction due to unspecified occlusion or stenosis of unspecified cerebral artery: Secondary | ICD-10-CM | POA: Diagnosis not present

## 2017-08-07 LAB — CBC
HEMATOCRIT: 34.3 % — AB (ref 39.0–52.0)
Hemoglobin: 11.4 g/dL — ABNORMAL LOW (ref 13.0–17.0)
MCH: 29.6 pg (ref 26.0–34.0)
MCHC: 33.2 g/dL (ref 30.0–36.0)
MCV: 89.1 fL (ref 78.0–100.0)
Platelets: 465 10*3/uL — ABNORMAL HIGH (ref 150–400)
RBC: 3.85 MIL/uL — ABNORMAL LOW (ref 4.22–5.81)
RDW: 12.7 % (ref 11.5–15.5)
WBC: 9.9 10*3/uL (ref 4.0–10.5)

## 2017-08-07 LAB — BASIC METABOLIC PANEL
ANION GAP: 13 (ref 5–15)
BUN: 15 mg/dL (ref 6–20)
CO2: 24 mmol/L (ref 22–32)
Calcium: 8.6 mg/dL — ABNORMAL LOW (ref 8.9–10.3)
Chloride: 103 mmol/L (ref 101–111)
Creatinine, Ser: 0.77 mg/dL (ref 0.61–1.24)
GFR calc Af Amer: >60 mL/min (ref >60)
GFR calc non Af Amer: >60 mL/min (ref >60)
GLUCOSE: 118 mg/dL — AB (ref 65–99)
POTASSIUM: 4 mmol/L (ref 3.5–5.1)
Sodium: 140 mmol/L (ref 135–145)

## 2017-08-07 LAB — GLUCOSE, CAPILLARY
GLUCOSE-CAPILLARY: 110 mg/dL — AB (ref 65–99)
Glucose-Capillary: 109 mg/dL — ABNORMAL HIGH (ref 65–99)
Glucose-Capillary: 113 mg/dL — ABNORMAL HIGH (ref 65–99)
Glucose-Capillary: 113 mg/dL — ABNORMAL HIGH (ref 65–99)

## 2017-08-07 LAB — PHOSPHORUS: Phosphorus: 3.1 mg/dL (ref 2.5–4.6)

## 2017-08-07 LAB — MAGNESIUM: Magnesium: 2.2 mg/dL (ref 1.7–2.4)

## 2017-08-07 MED ORDER — SORBITOL 70 % SOLN
30.0000 mL | Freq: Every day | Status: DC | PRN
  Administered 2017-08-08 – 2017-08-13 (×2): 30 mL via ORAL
  Filled 2017-08-07 (×4): qty 30

## 2017-08-07 MED ORDER — ONDANSETRON HCL 4 MG PO TABS
4.0000 mg | ORAL_TABLET | Freq: Four times a day (QID) | ORAL | Status: DC | PRN
Start: 2017-08-07 — End: 2017-09-07
  Filled 2017-08-07: qty 1

## 2017-08-07 MED ORDER — EZETIMIBE 10 MG PO TABS
10.0000 mg | ORAL_TABLET | Freq: Every day | ORAL | Status: DC
  Administered 2017-08-08 – 2017-09-07 (×30): 10 mg via ORAL
  Filled 2017-08-07 (×34): qty 1

## 2017-08-07 MED ORDER — PANTOPRAZOLE SODIUM 40 MG PO TBEC
40.0000 mg | DELAYED_RELEASE_TABLET | Freq: Every day | ORAL | Status: DC
  Administered 2017-08-08 – 2017-09-07 (×31): 40 mg via ORAL
  Filled 2017-08-07 (×32): qty 1

## 2017-08-07 MED ORDER — BISACODYL 10 MG RE SUPP
10.0000 mg | Freq: Every day | RECTAL | Status: DC | PRN
Start: 2017-08-07 — End: 2017-09-07
  Administered 2017-08-09 – 2017-08-22 (×3): 10 mg via RECTAL
  Filled 2017-08-07 (×5): qty 1

## 2017-08-07 MED ORDER — ONDANSETRON HCL 4 MG/2ML IJ SOLN
4.0000 mg | Freq: Four times a day (QID) | INTRAMUSCULAR | Status: DC | PRN
  Administered 2017-08-08 – 2017-08-10 (×4): 4 mg via INTRAVENOUS
  Filled 2017-08-07 (×4): qty 2

## 2017-08-07 MED ORDER — ENOXAPARIN SODIUM 40 MG/0.4ML ~~LOC~~ SOLN: 40.0000 mg | SUBCUTANEOUS | Status: DC

## 2017-08-07 MED ORDER — ACETAMINOPHEN 160 MG/5ML PO SOLN
650.0000 mg | ORAL | Status: DC | PRN
  Administered 2017-08-09 – 2017-08-12 (×5): 650 mg
  Filled 2017-08-07 (×5): qty 20.3

## 2017-08-07 MED ORDER — ACETAMINOPHEN 325 MG PO TABS
650.0000 mg | ORAL_TABLET | ORAL | Status: DC | PRN
  Administered 2017-08-10 – 2017-08-16 (×3): 650 mg via ORAL
  Filled 2017-08-07 (×5): qty 2

## 2017-08-07 MED ORDER — ACETAMINOPHEN 650 MG RE SUPP
650.0000 mg | RECTAL | Status: DC | PRN
  Administered 2017-08-10: 650 mg via RECTAL
  Filled 2017-08-07: qty 1

## 2017-08-07 MED ORDER — GERHARDT'S BUTT CREAM
TOPICAL_CREAM | Freq: Two times a day (BID) | CUTANEOUS | Status: DC
  Administered 2017-08-07 – 2017-08-12 (×7): via TOPICAL
  Administered 2017-08-13: 1 via TOPICAL
  Administered 2017-08-13 – 2017-08-30 (×21): via TOPICAL
  Administered 2017-08-31: 1 via TOPICAL
  Administered 2017-08-31 – 2017-09-06 (×11): via TOPICAL
  Filled 2017-08-07 (×2): qty 1

## 2017-08-07 MED ORDER — ENOXAPARIN SODIUM 40 MG/0.4ML ~~LOC~~ SOLN
40.0000 mg | SUBCUTANEOUS | Status: DC
  Administered 2017-08-07 – 2017-09-06 (×29): 40 mg via SUBCUTANEOUS
  Filled 2017-08-07 (×30): qty 0.4

## 2017-08-07 MED ORDER — ATORVASTATIN CALCIUM 40 MG PO TABS
40.0000 mg | ORAL_TABLET | Freq: Every day | ORAL | Status: DC
  Administered 2017-08-09 – 2017-09-06 (×29): 40 mg via ORAL
  Filled 2017-08-07 (×30): qty 1

## 2017-08-07 MED ORDER — ASPIRIN 325 MG PO TABS
325.0000 mg | ORAL_TABLET | Freq: Every day | ORAL | Status: DC
  Administered 2017-08-08 – 2017-09-07 (×31): 325 mg
  Filled 2017-08-07 (×32): qty 1

## 2017-08-07 NOTE — Progress Notes (Signed)
Occupational Therapy Treatment Patient Details Name: Jeremy [name] MRN: [id number] DOB: Oct 26, 1975 Today's Date: 08/07/2017    History of present illness 42 yo admitted with right sided weakness with Left MCA CVA s/p tPA with partial revascularization with crani and bone flap and tPA reversal. Pt intubated 1/11-1/17. PMhx: HTn, sleep apnea, depression   OT comments  Pt progressing towards goals. Pt required Max A +2 for stand pivot transfer. Pt performed oral care with Max A and Max cues to attend to R side. Pt requiring hand over hand to facilitate functional grasp of R hand and facilitate bialteral coordination during grooming. Continue to recommend CIR and will continue to follow acutely.    Follow Up Recommendations  CIR;Supervision/Assistance - 24 hour    Equipment Recommendations  Other (comment)(Defer to next venue)    Recommendations for Other Services PT consult;Rehab consult;Speech consult    Precautions / Restrictions Precautions Precautions: Fall Precaution Comments: no bone flap on the L, in abdomen Restrictions Weight Bearing Restrictions: No       Mobility Bed Mobility Overal bed mobility: Needs Assistance Bed Mobility: Rolling;Sidelying to Sit Rolling: Mod assist;+2 for physical assistance Sidelying to sit: Max assist;+2 for physical assistance       General bed mobility comments: Pt using LUE to pull on bed rails. Required Max A to bring hips towards EOB with bed pad. Poor trunk support and reuqired Max A.   Transfers Overall transfer level: Needs assistance Equipment used: None(2 person lift with gait belt and bed pad) Transfers: Sit to/from [employer] Transfers Sit to Stand: Mod assist;+2 physical assistance Stand pivot transfers: Max assist;+2 physical assistance       General transfer comment: R knee blocked and max A +2 for maintaining standing balance. Max cues for weight shift.     Balance Overall balance assessment: Needs  assistance Sitting-balance support: Feet supported;Bilateral upper extremity supported Sitting balance-Leahy Scale: Poor Sitting balance - Comments: pt with R UE place in WBing position, min/modA to maintain R UE position. worked on finding and maintaining midline. Pt requires minA to maintain upright position with max cues to contract abdominal muscles and "pinch shoulders together" Pt requires mod/maxA majority of time, otherise pt falls posteriorly Postural control: Posterior lean Standing balance support: Bilateral upper extremity supported;During functional activity Standing balance-Leahy Scale: Poor Standing balance comment: mod +2 for balance in standing                           ADL either performed or assessed with clinical judgement   ADL Overall ADL's : Needs assistance/impaired     Grooming: Sitting;Oral care;Wash/dry face;Maximal assistance(Supported sitting ) Grooming Details (indicate cue type and reason): Pt performed oral care with Max A. Provided hand over hand A to faciliate R hand grasp and bilateral coordination. Pt requiring support at R elbow to maintain position of RUE. Pt requiring Max cues to attend to RUE and R visual field. Placed grooming items on R side of visual field or midline.                 Toilet Transfer: (Simuated to recliner)           Functional mobility during ADLs: Maximal assistance;+2 for physical assistance;Cueing for sequencing(SPT) General ADL Comments: Pt continues to demostrate R inattention and required Max A for attending to R side.      Vision   Vision Assessment?: Vision impaired- to be further tested in functional context;Yes Tracking/Visual Pursuits:  Decreased smoothness of horizontal tracking;Decreased smoothness of vertical tracking;Requires cues, head turns, or add eye shifts to track;Unable to hold eye position out of midline Visual Fields: Right visual field deficit;Right homonymous hemianopsia;Impaired-to  be further tested in functional context Additional Comments: Pt unable to see pen coming from R side until at midline. Pt continues to demonstrate R inattention. Pt reportign blurry vision and contineus to close L eye.   Perception     Praxis      Cognition Arousal/Alertness: Awake/alert Behavior During Therapy: Flat affect Overall Cognitive Status: Impaired/Different from baseline Area of Impairment: Attention;Problem solving;Awareness;Following commands                   Current Attention Level: Selective   Following Commands: Follows one step commands consistently;Follows multi-step commands with increased time;Follows one step commands with increased time Safety/Judgement: Decreased awareness of deficits;Decreased awareness of safety(R sided inattention) Awareness: Emergent Problem Solving: Slow processing;Decreased initiation;Difficulty sequencing;Requires verbal cues;Requires tactile cues General Comments: Pt continues to require increased time and cues throughout session. Pt requiring Max cues to attend to R side of body and environment.        Exercises     Shoulder Instructions       General Comments Pt slightly more vocal today    Pertinent Vitals/ Pain       Pain Assessment: No/denies pain  Home Living                                          Prior Functioning/Environment              Frequency  Min 3X/week        Progress Toward Goals  OT Goals(current goals can now be found in the care plan section)  Progress towards OT goals: Progressing toward goals  Acute Rehab OT Goals Patient Stated Goal: return to work and home OT Goal Formulation: With patient Time For Goal Achievement: 08/18/17 Potential to Achieve Goals: Good ADL Goals Pt Will Perform Grooming: with min assist;sitting Pt Will Perform Upper Body Dressing: with min assist;sitting Pt Will Transfer to Toilet: with min assist;with +2 assist;bedside commode;stand  pivot transfer Additional ADL Goal #1: Pt will attend to 75% of objects on R side during ADLs. Additional ADL Goal #2: Pt will demonstrate three edema management strategies with 1-2 VCs Additional ADL Goal #3: Pt will use RUE as dependent stabilizer during ADLs Additional ADL Goal #4: Pt will perform bed mobility with Min A +2 for safety  Plan Discharge plan remains appropriate    Co-evaluation                 AM-PAC PT "6 Clicks" Daily Activity     Outcome Measure   Help from another person eating meals?: A Lot Help from another person taking care of personal grooming?: A Lot Help from another person toileting, which includes using toliet, bedpan, or urinal?: A Lot Help from another person bathing (including washing, rinsing, drying)?: A Lot Help from another person to put on and taking off regular upper body clothing?: A Lot Help from another person to put on and taking off regular lower body clothing?: A Lot 6 Click Score: 12    End of Session Equipment Utilized During Treatment: Gait belt  OT Visit Diagnosis: Unsteadiness on feet (R26.81);Other abnormalities of gait and mobility (R26.89);Muscle weakness (generalized) (M62.81);Other symptoms and signs involving  cognitive function;Hemiplegia and hemiparesis;Pain Hemiplegia - Right/Left: Right Hemiplegia - dominant/non-dominant: Non-Dominant Hemiplegia - caused by: Cerebral infarction   Activity Tolerance Patient tolerated treatment well   Patient Left in chair;with call bell/phone within reach;with chair alarm set   Nurse Communication Mobility status        Time: [id number] OT Time Calculation (min): 32 min  Charges: OT General Charges $OT Visit: 1 Visit OT Treatments $Self Care/Home Management : 23-37 mins  Jakevion Arney MSOT, OTR/L Acute Rehab Pager: [phone] Office: [phone]   Theodoro GristCharis M Elan Brainerd 08/07/2017, 1:29 PM

## 2017-08-07 NOTE — PMR Pre-admission (Signed)
PMR Admission Coordinator Pre-Admission Assessment  Patient: Jeremy [name] is an 42 y.o., male MRN: [id number] DOB: 08-Aug-1975 Height: 5\' 10"  (177.8 cm) Weight: 85.2 kg (187 lb 13.3 oz)              Insurance Information HMO:     PPO:      PCP:      IPA:      80/20:      OTHER: Blue Option with [employer]   PRIMARY: BCBS of [place]      Policy#: [id number]      Subscriber: Self CM Name: Carollee Herter      Phone#: [phone]     Fax#: [phone] Pre-Cert#: [id number] for 08/06/17-08/21/17 with faxed updates due by 2/4     Employer: Full Time Benefits:  Phone #: [phone]     Name: Verified online at [email or web address] Eff. Date: 07/17/17     Deduct: $750      Out of Pocket Max: $4000      Life Max: N/A CIR: 80%/20%      SNF: 80%/20% Outpatient: PT/OT 40 visits, SLP 40 visits      Co-Pay: $30 Home Health: Necessity 80%      Co-Pay: 20% DME: 80%     Co-Pay: 20% Providers: In-network   SECONDARY: None      Policy#:       Subscriber:  CM Name:       Phone#:      Fax#:  Pre-Cert#:       Employer:  Benefits:  Phone #:      Name:  Eff. Date:      Deduct:       Out of Pocket Max:       Life Max:  CIR:       SNF:  Outpatient:      Co-Pay:  Home Health:       Co-Pay:  DME:      Co-Pay:   Medicaid Application Date:       Case Manager:  Disability Application Date:       Case Worker:   Emergency Contact Information Contact Information    Name Relation Home Work Mobile   Jeremy [name] Spouse [phone]       Current Medical History  Patient Admitting Diagnosis: Dense right hemiparesis and aphasia due to left MCA infarct  History of Present Illness: Jeremy [name] a 43 y.o.right handed malewith history of hypertension on no antihypertensive medications. Presented 07/27/2017 with right-sided weakness, headache, facial droop and slurred speech. Per chart review patient lives with spouse. They have a 74-year-old son as well as wife is currently pregnant. One level home/thirdthird floor  apartment and they are currently seeking an apartment on the ground-level. Good family support in the area. Independent and working prior to admission. Cranial CT scan showed left insula and frontal operculum lucency compatible with infarction. echocardiogram with ejection fraction of 65% no wall motion abnormalities. No defect or PFO identified. CT Angiography head and neck showed left M1 occlusion with intermediate left MCA distribution collateralization. Underwent attempted mechanical thrombectomy per interventional radiology which was unsuccessful and required intubation. Follow-up MRI showed cerebral edema with midline shift maintained on 3% saline. Underwent left decompressive hemicraniectomy placement of bone flap abdominal pocket 07/29/2017 per Dr.Nundkumar. Initially maintained on Cleviprex. Patient was extubated 06/02/2018. NPOwith tube feeds for nutritional support and diet advanced to a dysphagia 1 nectar-thick liquid diet.  Follow-up CT 08/03/2017 stable no new changes. Bilateral lower extremity Dopplers negative. TEE was  attempted 08/06/2017 but unable to perform. No plan for loop recorder. Subcutaneous Lovenox was later initiated for DVT prophylaxis. Physical and occupational therapy evaluations completedwithrecommendations for a physical medicine rehabilitation consult. Patient was admitted for a comprehensive rehabilitation program 08/07/17.  NIH Total: 18    Past Medical History  Past Medical History:  Diagnosis Date  . Anxiety   . Asthma    MILD AS A CHILD  . Depression   . Deviated septum   . Dyspnea    NORMAL SPIROMETRY 02/01/17 VISIT WITH DR HEDRICK  . Hypertension   . Sleep apnea     Family History  family history is not on file.  Prior Rehab/Hospitalizations:  Has the patient had major surgery during 100 days prior to admission? No  Current Medications   Current Facility-Administered Medications:  .  0.9 %  sodium chloride infusion, 250 mL, Intravenous, PRN,  Tobey Grim, NP .  acetaminophen (TYLENOL) tablet 650 mg, 650 mg, Oral, Q4H PRN, 650 mg at 08/03/17 2227 **OR** acetaminophen (TYLENOL) solution 650 mg, 650 mg, Per Tube, Q4H PRN, 650 mg at 08/02/17 0113 **OR** acetaminophen (TYLENOL) suppository 650 mg, 650 mg, Rectal, Q4H PRN, Marvel Plan, MD .  aspirin tablet 325 mg, 325 mg, Per Tube, Daily, Marvel Plan, MD, 325 mg at 08/07/17 0913 .  atorvastatin (LIPITOR) tablet 40 mg, 40 mg, Oral, q1800, Marvel Plan, MD, 40 mg at 08/06/17 1754 .  bisacodyl (DULCOLAX) suppository 10 mg, 10 mg, Rectal, Daily PRN, Selmer Dominion B, NP, 10 mg at 08/01/17 0817 .  Chlorhexidine Gluconate Cloth 2 % PADS 6 each, 6 each, Topical, Daily, Marvel Plan, MD, 6 each at 08/06/17 1050 .  enoxaparin (LOVENOX) injection 40 mg, 40 mg, Subcutaneous, Q24H, Marvel Plan, MD, 40 mg at 08/06/17 2257 .  ezetimibe (ZETIA) tablet 10 mg, 10 mg, Oral, Daily, Marvel Plan, MD, 10 mg at 08/07/17 0913 .  Gerhardt's butt cream, , Topical, BID, Sethi, Pramod S, MD .  insulin aspart (novoLOG) injection 1-3 Units, 1-3 Units, Subcutaneous, Q4H, Simpson, Paula B, NP, 1 Units at 08/06/17 1600 .  labetalol (NORMODYNE,TRANDATE) injection 5-10 mg, 5-10 mg, Intravenous, Q10 min PRN, Marvel Plan, MD, 10 mg at 08/02/17 1236 .  lidocaine (PF) (XYLOCAINE) 1 % injection 0-30 mL, 0-30 mL, Intradermal, Once PRN, Lupita Leash, MD .  MEDLINE mouth rinse, 15 mL, Mouth Rinse, BID, Jimmye Norman, MD, 15 mL at 08/06/17 2129 .  pantoprazole (PROTONIX) EC tablet 40 mg, 40 mg, Oral, Daily, Aroor, Dara Lords, MD, 40 mg at 08/07/17 0913 .  potassium chloride 20 MEQ/15ML (10%) solution 40 mEq, 40 mEq, Oral, BID, Micki Riley, MD, 40 mEq at 08/07/17 0913 .  senna-docusate (Senokot-S) tablet 1 tablet, 1 tablet, Oral, QHS PRN, Milon Dikes, MD .  sodium chloride flush (NS) 0.9 % injection 10-40 mL, 10-40 mL, Intracatheter, Q12H, Marvel Plan, MD, 10 mL at 08/05/17 1946 .  sodium chloride flush (NS) 0.9 %  injection 10-40 mL, 10-40 mL, Intracatheter, PRN, Marvel Plan, MD  Patients Current Diet: Fall precautions DIET - DYS 1 Room service appropriate? 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    Home Assistive Devices /  Equipment Home Equipment: None  Prior Device Use: Indicate devices/aids used by the patient prior to current illness, exacerbation or injury? None of the above  Prior Functional Level Prior Function Level of Independence: Independent Comments: pt worked with Conservation officer, 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 and lives with wife and 8yo son  Self Care: Did the patient need help bathing, dressing, using the toilet or eating? Independent  Indoor Mobility: Did the patient need assistance with walking from room to room (with or without device)? Independent  Stairs: Did the patient need assistance with internal or external stairs (with or without device)? Independent  Functional Cognition: Did the patient need help planning regular tasks such as shopping or remembering to take medications? Independent  Current Functional Level Cognition  Arousal/Alertness: Awake/alert Overall Cognitive Status: Impaired/Different from baseline Current Attention Level: Selective Orientation Level: Oriented X4 Following Commands: Follows one step commands consistently, Follows multi-step commands with increased time, Follows one step commands with increased time Safety/Judgement: Decreased awareness of deficits, Decreased awareness of safety(R sided inattention) General Comments: Pt continues to require increased time and cues throughout session. Pt requiring Max cues to attend  to R side of body and environment. Attention: Focused, Sustained, Selective Focused Attention: Appears intact Sustained Attention: Impaired Sustained Attention Impairment: Verbal basic, Functional basic Selective Attention: Impaired Selective Attention Impairment: Verbal basic, Functional basic Memory: Impaired Memory Impairment: Decreased recall of new information, Decreased short term memory Decreased Short Term Memory: Verbal basic(delayed recall 1/4) Awareness: Impaired Awareness Impairment: Emergent impairment Problem Solving: Impaired Problem Solving Impairment: Functional basic(errors in simple subtraction) Executive Function: Reasoning Reasoning: Impaired Reasoning Impairment: Verbal complex Safety/Judgment: Impaired Comments: left gaze preference, R neglect    Extremity Assessment (includes Sensation/Coordination)  Upper Extremity Assessment: RUE deficits/detail RUE Deficits / Details: Brunnstrom stage 1 with no active movement in ROM. Edema noted thorughout forearm and hand.  RUE Sensation: decreased proprioception, decreased light touch RUE Coordination: decreased fine motor, decreased gross motor  Lower Extremity Assessment: Defer to PT evaluation RLE Deficits / Details: no noted AROM or sensation RLE Sensation: decreased proprioception, decreased light touch    ADLs  Overall ADL's : Needs assistance/impaired Eating/Feeding: Moderate assistance, Sitting Grooming: Sitting, Oral care, Wash/dry face, Maximal assistance(Supported sitting ) Grooming Details (indicate cue type and reason): Pt performed oral care with Max A. Provided hand over hand A to faciliate R hand grasp and bilateral coordination. Pt requiring support at R elbow to maintain position of RUE. Pt requiring Max cues to attend to RUE and R visual field. Placed grooming items on R side of visual field or midline. Upper Body Bathing: Maximal assistance, Sitting, Bed level Lower Body Bathing: Maximal  assistance, +2 for safety/equipment, Cueing for sequencing Lower Body Bathing Details (indicate cue type and reason): Max A for sitting balance at EOB and perform peri care. Pt requiring Max cues to attend to R side and cross midline to pick up wash cloth with L hand and place back on right side. Providing tactile cues and facilitation of WBing through RUE on EOB during task. Upper Body Dressing : Maximal assistance, Sitting, Bed level Lower Body Dressing: Maximal assistance, Sit to/from stand, Bed level, +2 for physical assistance Toilet Transfer: (Simuated to recliner) Functional mobility during ADLs: Maximal assistance, +2 for physical assistance, Cueing for sequencing(SPT) General ADL Comments: Pt continues to demostrate R inattention and required Max A for attending to R side.     Mobility  Overal bed mobility: Needs Assistance Bed Mobility: Rolling, Sidelying to Sit Rolling: Mod assist, +2 for physical assistance  Sidelying to sit: Max assist, +2 for physical assistance Supine to sit: Max assist, +2 for physical assistance General bed mobility comments: Pt using LUE to pull on bed rails. Required Max A to bring hips towards EOB with bed pad. Poor trunk support and reuqired Max A.     Transfers  Overall transfer level: Needs assistance Equipment used: None(2 person lift with gait belt and bed pad) Transfers: Sit to/from Stand, Stand Pivot Transfers Sit to Stand: Mod assist, +2 physical assistance Stand pivot transfers: Max assist, +2 physical assistance Squat pivot transfers: Mod assist General transfer comment: R knee blocked and max A +2 for maintaining standing balance. Max cues for weight shift.     Ambulation / Gait / Stairs / Wheelchair Mobility  Ambulation/Gait General Gait Details: unable    Posture / Balance Dynamic Sitting Balance Sitting balance - Comments: pt with R UE place in WBing position, min/modA to maintain R UE position. worked on finding and maintaining midline.  Pt requires minA to maintain upright position with max cues to contract abdominal muscles and "pinch shoulders together" Pt requires mod/maxA majority of time, otherise pt falls posteriorly Balance Overall balance assessment: Needs assistance Sitting-balance support: Feet supported, Bilateral upper extremity supported Sitting balance-Leahy Scale: Poor Sitting balance - Comments: pt with R UE place in WBing position, min/modA to maintain R UE position. worked on finding and maintaining midline. Pt requires minA to maintain upright position with max cues to contract abdominal muscles and "pinch shoulders together" Pt requires mod/maxA majority of time, otherise pt falls posteriorly Postural control: Posterior lean Standing balance support: Bilateral upper extremity supported, During functional activity Standing balance-Leahy Scale: Poor Standing balance comment: mod +2 for balance in standing    Special needs/care consideration BiPAP/CPAP: CPAP at night that he wore intermittently since deviated septum surgery  CPM: No Continuous Drip IV: No Dialysis: No        Life Vest: No Oxygen: No Special Bed: No Trach Size: No Wound Vac (area): No       Skin: Left head surgical incision; Groin area with rash                                Bowel mgmt: Continent, last BM 08/05/17 Bladder mgmt: Incontinence but also continent at times with urinal close   Diabetic mgmt: No     Previous Home Environment Living Arrangements: Spouse/significant other, Children  Lives With: Spouse Available Help at Discharge: Family, Available 24 hours/day Type of Home: Apartment Home Layout: One level Home Access: Stairs to enter [employer] of [employer]: 3rd floor apartment Bathroom Shower/Tub: Engineer, manufacturing systems: Standard Home Care Services: No  Discharge Living Setting Plans for Discharge Living Setting: Patient's home, Lives with (comment)(Spouse, 41 year old son, daughter on the way) Type  of Home at Discharge: Apartment(moving to a ground level by Feb. 1) Discharge Home Layout: One level Discharge Home Access: Level entry Discharge Bathroom Shower/Tub: Other (comment)(TBD) Discharge Bathroom Toilet: Handicapped height Discharge Bathroom Accessibility: Yes How Accessible: Accessible via wheelchair, Accessible via walker Does the patient have any problems obtaining your medications?: No  Social/Family/Support Systems Patient Roles: Spouse, Parent Contact Information: Spouse: Melanie Ehinger Anticipated Caregiver: Mother, Misty Stanley will be here from [facility]. Anticipated Caregiver's Contact Information: Beverely Risen: [phone] Ability/Limitations of Caregiver: Spouse is due to deliver daughter in 2.5 months  Caregiver Availability: 24/7 Discharge Plan Discussed with Primary Caregiver: Yes Is Caregiver In Agreement with  Plan?: Yes Does Caregiver/Family have Issues with Lodging/Transportation while Pt is in Rehab?: No  Goals/Additional Needs Patient/Family Goal for Rehab: PT/OT/SLP: Supervision-Min A Expected length of stay: 24-30 days  Cultural Considerations: Jehovah's Witness-no blood products Dietary Needs: Dys.1 textures and nectar-thick liquids  Equipment Needs: TBD Special Service Needs: will need a helmet Additional Information: Left crani Pt/Family Agrees to Admission and willing to participate: Yes Program Orientation Provided & Reviewed with Pt/Caregiver Including Roles  & Responsibilities: Yes  Decrease burden of Care through IP rehab admission: No  Possible need for SNF placement upon discharge: No  Patient Condition: This patient's medical and functional status has changed since the consult dated: 08/03/17 in which the Rehabilitation Physician determined and documented that the patient's condition is appropriate for intensive rehabilitative care in an inpatient rehabilitation facility. See "History of Present Illness" (above) for medical update. Functional changes  are: Mod-Max +2 assist with transfers. Patient's medical and functional status update has been discussed with the Rehabilitation physician and patient remains appropriate for inpatient rehabilitation. Will admit to inpatient rehab today.  Preadmission Screen Completed By:  Fae Pippin, 08/07/2017 1:45 PM ______________________________________________________________________   Discussed status with Dr. Riley Kill on 08/07/17 at 1350 and received telephone approval for admission today.  Admission Coordinator:  Fae Pippin, time 1350/Date 08/07/17

## 2017-08-07 NOTE — Progress Notes (Signed)
Inpatient Rehabilitation  I have insurance authorization and an IP Rehab bed to offer patient today.  I have also received medical clearance and will proceed with admission today.  Call if questions.   Charlane FerrettiMelissa Sequoyah Ramone, M.A., CCC/SLP Admission Coordinator  [facility]  Cell [phone]

## 2017-08-07 NOTE — H&P (Signed)
Physical Medicine and Rehabilitation Admission H&P       Chief Complaint  Patient presents with  . Code Stroke  : HPI: Jeremy [name] a 42 y.o.right handed malewith history of hypertension on no antihypertensive medications. Presented 07/27/2017 with right-sided weakness, headache, facial droop and slurred speech. Per chart review patient lives with spouse. They have a 20-year-old son as well as wife is currently pregnant. One level home/thirdthird floor apartment and they are currently seeking an apartment ground-level. Good family support in the area. Independent and working prior to admission. Cranial CT scan showed left insula and frontal operculum lucency compatible with infarction. echocardiogram with ejection fraction of 65% no wall motion abnormalities. No defect or PFO identified. CT Angiography head and neck showed left M1 occlusion with intermediate left MCA distribution collateralization. Underwent attempted mechanical thrombectomy per interventional radiology which was unsuccessful and required intubation. Follow-up MRI showed cerebral edema with midline shift maintained on 3% saline. Underwent left decompressive hemicraniectomy placement of bone flap abdominal pocket 07/29/2017 per Dr.Nundkumar. Initially maintained on Cleviprex. Patient was extubated 06/02/2018 NPOwith tube feeds for nutritional support and diet advanced to a dysphagia #1 nectar thick liquid.  Follow-up CT 08/03/2017 stable no new changes. Bilateral lower extremity Dopplers negative. TEE was attempted 08/06/2017 but unable to perform. No plan for loop recorder. Subcutaneous Lovenox was later initiated for DVT prophylaxis. Physical and occupational therapy evaluations completedwithrecommendations of physical medicine rehabilitation consult. Patient was admitted for a comprehensive rehabilitation program    Review of Systems  Constitutional: Negative for chills and fever.  HENT: Negative for hearing  loss.   Eyes: Negative for blurred vision and double vision.  Respiratory: Negative for cough and shortness of breath.   Cardiovascular: Negative for chest pain, palpitations and leg swelling.  Gastrointestinal: Positive for nausea.  Genitourinary: Negative for flank pain and hematuria.  Skin: Negative for rash.  Neurological: Positive for speech change, focal weakness and headaches.  Psychiatric/Behavioral: Positive for depression.  All other systems reviewed and are negative.      Past Medical History:  Diagnosis Date  . Anxiety   . Asthma    MILD AS A CHILD  . Depression   . Deviated septum   . Dyspnea    NORMAL SPIROMETRY 02/01/17 VISIT WITH DR [place]  . Hypertension   . Sleep apnea         Past Surgical History:  Procedure Laterality Date  . CRANIOTOMY Left 07/29/2017   Procedure: LEFT HEMI- CRANIECTOMY WITH PLACEMENT OF BONE FLAP IN ABDOMEN;  Surgeon: Consuella Lose, MD;  Location: [facility];  Service: Neurosurgery;  Laterality: Left;  . IR ANGIO INTRA EXTRACRAN SEL COM CAROTID INNOMINATE UNI R MOD SED  07/28/2017  . IR ANGIO VERTEBRAL SEL VERTEBRAL UNI R MOD SED  07/28/2017  . IR PERCUTANEOUS ART THROMBECTOMY/INFUSION INTRACRANIAL INC DIAG ANGIO  07/28/2017  . NASAL SEPTOPLASTY W/ TURBINOPLASTY Bilateral 03/06/2017   Procedure: NASAL SEPTOPLASTY WITH TURBINATE REDUCTION;  Surgeon: Beverly Gust, MD;  Location: [facility];  Service: ENT;  Laterality: Bilateral;  . RADIOLOGY WITH ANESTHESIA N/A 07/27/2017   Procedure: RADIOLOGY WITH ANESTHESIA;  Surgeon: Luanne Bras, MD;  Location: [facility];  Service: Radiology;  Laterality: N/A;   History reviewed. No pertinent family history. Social History:  reports that  has never smoked. he has never used smokeless tobacco. He reports that he drinks alcohol. He reports that he does not use drugs. Allergies:       Allergies  Allergen Reactions  . Blood-Group Specific  Substance     PT JEHOVAHS WITNESS DOES NOT  ACCEPT BLOOD PRODUCTS         Medications Prior to Admission  Medication Sig Dispense Refill  . spironolactone (ALDACTONE) 100 MG tablet Take 100 mg by mouth daily.    Marland Kitchen amLODipine (NORVASC) 10 MG tablet Take 10 mg by mouth at bedtime.  3  . KLOR-CON M20 20 MEQ tablet Take 40 mEq by mouth daily.  11  . oxyCODONE-acetaminophen (ROXICET) 5-325 MG tablet Take 1-2 tablets by mouth every 4 (four) hours as needed for severe pain. 40 tablet 0  . sulfamethoxazole-trimethoprim (BACTRIM DS,SEPTRA DS) 800-160 MG tablet Take 1 tablet by mouth 2 (two) times daily. (Patient not taking: Reported on 07/28/2017) 20 tablet 0  . triamterene-hydrochlorothiazide (MAXZIDE-25) 37.5-25 MG tablet Take 1 tablet by mouth daily.  11    Drug Regimen Review Drug regimen was reviewed and remains appropriate with no significant issues identified  Home: Home Living Family/patient expects to be discharged to:: Private residence Living Arrangements: Spouse/significant other, Children Available Help at Discharge: Family, Available 24 hours/day Type of Home: Apartment Home Access: Stairs to enter [employer] of Steps: 3rd floor apartment Home Layout: One level Bathroom Shower/Tub: Chiropodist: Standard Home Equipment: None  Lives With: Spouse   Functional History: Prior Function Level of Independence: Independent Comments: 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 Transfers Sit to Stand: Mod assist, +2 physical assistance Stand pivot transfers: Max assist, +2 physical assistance Squat pivot transfers: Mod assist General transfer comment: pt unable to advance  RLE, R Knee blocked during advancement of L LE due to buckling of R KNEE. max tactile cues for weight-shifting. unable to use R UE functionally Ambulation/Gait General Gait Details: unable  ADL: ADL Overall ADL's : Needs assistance/impaired Eating/Feeding: Moderate assistance, Sitting Grooming: Wash/dry hands, Sitting Grooming Details (indicate cue type and reason): Pt washing hands with Mod A for managing and facilitate bilateral coordination. Pt requiring support at R elbow and wrist to bring RUE to midline. Pt requiring Max cues to attend to RUE and then rub hands towards.  Upper Body Bathing: Maximal assistance, Sitting, Bed level Lower Body Bathing: Maximal assistance, +2 for safety/equipment, Cueing for sequencing Lower Body Bathing Details (indicate cue type and reason): Max A for sitting balance at EOB and perform peri care. Pt requiring Max cues to attend to R side and cross midline to pick up wash cloth with L hand and place back on right side. Providing tactile cues and facilitation of WBing through RUE on EOB during task. Upper Body Dressing : Maximal assistance, Sitting, Bed level Lower Body Dressing: Maximal assistance, Sit to/from stand, Bed level, +2 for physical assistance Toilet Transfer: Maximal assistance, Stand-pivot, +2 for physical assistance(Simuated to recliner) Functional mobility during ADLs: Maximal assistance, +2 for physical assistance, Cueing for sequencing(SPT) General ADL Comments: Pt continues to demostrate R inattention and required Max A for attending to R side.   Cognition: Cognition Overall Cognitive Status: Impaired/Different from baseline  Arousal/Alertness: Awake/alert Orientation Level: Oriented X4 Attention: Focused, Sustained,  Selective Focused Attention: Appears intact Sustained Attention: Impaired Sustained Attention Impairment: Verbal basic, Functional basic Selective Attention: Impaired Selective Attention Impairment: Verbal basic, Functional basic Memory: Impaired Memory Impairment: Decreased recall of new information, Decreased short term memory Decreased Short Term Memory: Verbal basic(delayed recall 1/4) Awareness: Impaired Awareness Impairment: Emergent impairment Problem Solving: Impaired Problem Solving Impairment: Functional basic(errors in simple subtraction) Executive Function: Reasoning Reasoning: Impaired Reasoning Impairment: Verbal complex Safety/Judgment: Impaired Comments: left gaze preference, R neglect Cognition Arousal/Alertness: Awake/alert Behavior During Therapy: Flat affect Overall Cognitive Status: Impaired/Different from baseline Area of Impairment: Attention, Problem solving, Awareness, Following commands Current Attention Level: Selective Following Commands: Follows one step commands consistently, Follows multi-step commands with increased time, Follows one step commands with increased time Safety/Judgement: Decreased awareness of deficits, Decreased awareness of safety(R sided inattention) Awareness: Emergent Problem Solving: Slow processing, Decreased initiation, Difficulty sequencing, Requires verbal cues, Requires tactile cues General Comments: Pt requiring increased time and cues throughout session. Pt requiring Max cues to attend to R side of body and environment.  Physical Exam: Blood pressure (!) 146/99, pulse 88, temperature 97.9 F (36.6 C), temperature source Oral, resp. rate 18, height '5\' 10"'  (1.778 m), weight 85.2 kg (187 lb 13.3 oz), SpO2 96 %. Physical Exam  Vitals reviewed. Constitutional: No distress.  HENT:  Craniotomy site clean and dry  Eyes: Pupils are equal, round, and reactive to light. Right eye exhibits no discharge. Left eye exhibits no  discharge.  Pupils reactive to light  Neck: Normal range of motion. Neck supple. No tracheal deviation present. No thyromegaly present.  Cardiovascular: Normal rate, regular rhythm and normal heart sounds.  No murmur heard. Respiratory: Effort normal. No respiratory distress. He has no wheezes. He has no rales.  GI: Soft. Bowel sounds are normal. He exhibits no distension. There is tenderness. There is no rebound.  Craniectomy flap and right lower quadrant.  Area expectedly tender.  Incision intact with staples and no drainage  Skin: He is not diaphoretic.   Neuro, patient with left gaze preference.  Right central 7 tongue deviation.  Limited abduction right eye he is generally nonverbal but shakes his head and affirmation occasionally and puts his left hand out giving a 50-50 sign to a lot of yes no answers I asked him.  Motor is 0 out of 5 proximal to distal in both right arm and right leg.  He seems to move left arm and leg fairly freely.  Senses pain right arm and leg grossly. Psych: Patient flat and disengaged today   LabResultsLast48Hours        Results for orders placed or performed during the hospital encounter of 07/27/17 (from the past 48 hour(s))  Glucose, capillary     Status: None   Collection Time: 08/05/17  8:00 AM  Result Value Ref Range   Glucose-Capillary 99 65 - 99 mg/dL  Glucose, capillary     Status: Abnormal   Collection Time: 08/05/17 12:06 PM  Result Value Ref Range   Glucose-Capillary 102 (H) 65 - 99 mg/dL  Glucose, capillary     Status: None   Collection Time: 08/05/17  3:33 PM  Result Value Ref Range   Glucose-Capillary 80 65 - 99 mg/dL  Glucose, capillary     Status: Abnormal   Collection Time: 08/05/17  7:35 PM  Result Value Ref Range   Glucose-Capillary 112 (H) 65 - 99 mg/dL  Glucose, capillary     Status: Abnormal  Collection Time: 08/05/17 11:12 PM  Result Value Ref Range   Glucose-Capillary 112 (H) 65 - 99 mg/dL  Glucose, capillary      Status: Abnormal   Collection Time: 08/06/17  3:23 AM  Result Value Ref Range   Glucose-Capillary 109 (H) 65 - 99 mg/dL  Basic metabolic panel     Status: Abnormal   Collection Time: 08/06/17  4:50 AM  Result Value Ref Range   Sodium 142 135 - 145 mmol/L   Potassium 3.1 (L) 3.5 - 5.1 mmol/L   Chloride 109 101 - 111 mmol/L   CO2 25 22 - 32 mmol/L   Glucose, Bld 115 (H) 65 - 99 mg/dL   BUN 14 6 - 20 mg/dL   Creatinine, Ser 0.74 0.61 - 1.24 mg/dL   Calcium 7.8 (L) 8.9 - 10.3 mg/dL   GFR calc non Af Amer >60 >60 mL/min   GFR calc Af Amer >60 >60 mL/min    Comment: (NOTE) The eGFR has been calculated using the CKD EPI equation. This calculation has not been validated in all clinical situations. eGFR's persistently <60 mL/min signify possible Chronic Kidney Disease.    Anion gap 8 5 - 15  Glucose, capillary     Status: Abnormal   Collection Time: 08/06/17 11:17 AM  Result Value Ref Range   Glucose-Capillary 111 (H) 65 - 99 mg/dL  Glucose, capillary     Status: Abnormal   Collection Time: 08/06/17  4:32 PM  Result Value Ref Range   Glucose-Capillary 136 (H) 65 - 99 mg/dL  Glucose, capillary     Status: Abnormal   Collection Time: 08/06/17  8:22 PM  Result Value Ref Range   Glucose-Capillary 116 (H) 65 - 99 mg/dL   Comment 1 Notify RN    Comment 2 Document in Chart   Glucose, capillary     Status: Abnormal   Collection Time: 08/06/17 11:58 PM  Result Value Ref Range   Glucose-Capillary 116 (H) 65 - 99 mg/dL   Comment 1 Notify RN    Comment 2 Document in Chart   Glucose, capillary     Status: Abnormal   Collection Time: 08/07/17  4:20 AM  Result Value Ref Range   Glucose-Capillary 110 (H) 65 - 99 mg/dL   Comment 1 Notify RN    Comment 2 Document in Chart      ImagingResults(Last48hours)  No results found.       Medical Problem List and Plan: 1.  Dense right hemiparesis and aphasia/dysphagia secondary to left large  MCA infarction/left M1 occlusion status post TPA with unsuccessful attempt at mechanical thrombectomy. Status post decompressive hemicraniectomy placement of bone flap abdominal pocket 07/29/2017. Will order protective helmet             -Admit to inpatient rehab 2.  DVT Prophylaxis/Anticoagulation: Subcutaneous Lovenox initiated 07/31/2017. Venous Doppler studies negative 3. Pain Management: Tylenol as needed 4. Mood: Provide emotional support.  Patient with coping issues due to his diagnosis and physical losses.  Social worker to screen.  Neuropsychology to be involved as well.   5. Neuropsych: This patient is not completely capable of making decisions on his own behalf. 6. Skin/Wound Care: Routine skin checks 7. Fluids/Electrolytes/Nutrition: Routine I&O's with follow-up chemistries 8. Dysphagia. Dysphagia #1 nectar liq consider antidepressant uids. Follow-up speech therapy 9. Hypertension. Patient initially on Cleviprex. Monitor with increased mobility 10. Hyperlipidemia. [place]   Post Admission Physician Evaluation: 1. Functional deficits secondary  to large left MCA infarct status post craniectomy  with bone flap and abdomen. 2. Patient is admitted to receive collaborative, interdisciplinary care between the physiatrist, rehab nursing staff, and therapy team. 3. Patient's level of medical complexity and substantial therapy needs in context of that medical necessity cannot be provided at a lesser intensity of care such as a SNF. 4. Patient has experienced substantial functional loss from his/her baseline which was documented above under the "Functional History" and "Functional Status" headings.  Judging by the patient's diagnosis, physical exam, and functional history, the patient has potential for functional progress which will result in measurable gains while on inpatient rehab.  These gains will be of substantial and practical use upon discharge  in facilitating mobility and  self-care at the household level. 5. Physiatrist will provide 24 hour management of medical needs as well as oversight of the therapy plan/treatment and provide guidance as appropriate regarding the interaction of the two. 6. The Preadmission Screening has been reviewed and patient status is unchanged unless otherwise stated above. 7. 24 hour rehab nursing will assist with bladder management, bowel management, safety, skin/wound care, disease management, medication administration, pain management and patient education  and help integrate therapy concepts, techniques,education, etc. 8. PT will assess and treat for/with: Lower extremity strength, range of motion, stamina, balance, functional mobility, safety, adaptive techniques and equipment, neuromuscular reeducation, family education, visual spatial awareness.   Goals are: Supervision to minimal assistance. 9. OT will assess and treat for/with: ADL's, functional mobility, safety, upper extremity strength, adaptive techniques and equipment, NMR, visual spatial awareness, family education.   Goals are: Supervision to minimal assistance. Therapy may proceed with showering this patient. 10. SLP will assess and treat for/with: Cognition, communication, swallowing, family education.  Goals are: Supervision to minimal assistance. 11. Case Management and Social Worker will assess and treat for psychological issues and discharge planning. 12. Team conference will be held weekly to assess progress toward goals and to determine barriers to discharge. 13. Patient will receive at least 3 hours of therapy per day at least 5 days per week. 14. ELOS: 24-30 days       15. Prognosis:  excellent     Meredith Staggers, MD, [facility] 08/07/2017  Lavon Paganini Almyra, PA-C 08/07/2017

## 2017-08-07 NOTE — Addendum Note (Signed)
Addendum  created 08/07/17 1250 by Trevor IhaHouser, Stephen A, MD   Sign clinical note, SmartForm saved
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
## 2017-08-07 NOTE — Discharge Summary (Signed)
Stroke Discharge Summary  Patient ID: Jeremy [name]   MRN: [id number]      DOB: 06-30-1976  Date of Admission: 07/27/2017 Date of Discharge: 08/07/2017  Attending Physician:  Garvin Fila, MD, Stroke MD Consultant(s):   pulmonary/intensive care, rehabilitation medicine and NeuroSurgery, Interventional Radiology Patient's PCP:  [place] Pink, MD  Discharge Diagnoses:  Active Problems:   Cryptogenic Stroke (cerebrum) (HCC) Left MCA occlusion s/p attempted mechanical embolectomy with small subarachnoid hemorrhage and cytotoxic edema  needing emergent hemicraniectomy   Cytotoxic Cerebral edema (HCC)   Hyperlipidemia   S/P craniotomy   Acute respiratory failure (HCC)   Central line infiltration (HCC)   Endotracheal tube present   Fever   Leukocytosis   B12 deficiency  Past Medical History:  Diagnosis Date  . Anxiety   . Asthma    MILD AS A CHILD  . Depression   . Deviated septum   . Dyspnea    NORMAL SPIROMETRY 02/01/17 VISIT WITH DR [place]  . Hypertension   . Sleep apnea    Past Surgical History:  Procedure Laterality Date  . CRANIOTOMY Left 07/29/2017   Procedure: LEFT HEMI- CRANIECTOMY WITH PLACEMENT OF BONE FLAP IN ABDOMEN;  Surgeon: Consuella Lose, MD;  Location: [place];  Service: Neurosurgery;  Laterality: Left;  . IR ANGIO INTRA EXTRACRAN SEL COM CAROTID INNOMINATE UNI R MOD SED  07/28/2017  . IR ANGIO VERTEBRAL SEL VERTEBRAL UNI R MOD SED  07/28/2017  . IR PERCUTANEOUS ART THROMBECTOMY/INFUSION INTRACRANIAL INC DIAG ANGIO  07/28/2017  . NASAL SEPTOPLASTY W/ TURBINOPLASTY Bilateral 03/06/2017   Procedure: NASAL SEPTOPLASTY WITH TURBINATE REDUCTION;  Surgeon: Beverly Gust, MD;  Location: [facility];  Service: ENT;  Laterality: Bilateral;  . RADIOLOGY WITH ANESTHESIA N/A 07/27/2017   Procedure: RADIOLOGY WITH ANESTHESIA;  Surgeon: Luanne Bras, MD;  Location: [place];  Service: Radiology;  Laterality: N/A;    Medications to be continued on Rehab . aspirin  325  mg Per Tube Daily  . atorvastatin  40 mg Oral q1800  . Chlorhexidine Gluconate Cloth  6 each Topical Daily  . enoxaparin (LOVENOX) injection  40 mg Subcutaneous Q24H  . ezetimibe  10 mg Oral Daily  . Gerhardt's butt cream   Topical BID  . insulin aspart  1-3 Units Subcutaneous Q4H  . mouth rinse  15 mL Mouth Rinse BID  . pantoprazole  40 mg Oral Daily  . potassium chloride  40 mEq Oral BID  . sodium chloride flush  10-40 mL Intracatheter Q12H   LABORATORY STUDIES CBC    Component Value Date/Time   WBC 9.9 08/07/2017 0956   RBC 3.85 (L) 08/07/2017 0956   HGB 11.4 (L) 08/07/2017 0956   HCT 34.3 (L) 08/07/2017 0956   PLT 465 (H) 08/07/2017 0956   MCV 89.1 08/07/2017 0956   MCH 29.6 08/07/2017 0956   MCHC 33.2 08/07/2017 0956   RDW 12.7 08/07/2017 0956   LYMPHSABS 1.3 07/28/2017 0259   MONOABS 0.5 07/28/2017 0259   EOSABS 0.0 07/28/2017 0259   BASOSABS 0.0 07/28/2017 0259   CMP    Component Value Date/Time   NA 140 08/07/2017 0956   K 4.0 08/07/2017 0956   CL 103 08/07/2017 0956   CO2 24 08/07/2017 0956   GLUCOSE 118 (H) 08/07/2017 0956   BUN 15 08/07/2017 0956   CREATININE 0.77 08/07/2017 0956   CALCIUM 8.6 (L) 08/07/2017 0956   PROT 7.8 07/27/2017 2055   ALBUMIN 4.2 07/27/2017 2055   AST 48 (  H) 07/27/2017 2055   ALT 98 (H) 07/27/2017 2055   ALKPHOS 40 07/27/2017 2055   BILITOT 1.1 07/27/2017 2055   GFRNONAA >60 08/07/2017 0956   GFRAA >60 08/07/2017 0956   COAGS Lab Results  Component Value Date   INR 0.98 07/27/2017   Lipid Panel    Component Value Date/Time   CHOL 218 (H) 07/28/2017 0259   TRIG 316 (H) 07/30/2017 2323   HDL 28 (L) 07/28/2017 0259   CHOLHDL 7.8 07/28/2017 0259   VLDL UNABLE TO CALCULATE IF TRIGLYCERIDE OVER 400 mg/dL 07/28/2017 0259   LDLCALC UNABLE TO CALCULATE IF TRIGLYCERIDE OVER 400 mg/dL 07/28/2017 0259   HgbA1C  Lab Results  Component Value Date   HGBA1C 5.2 07/28/2017   Urinalysis    Component Value Date/Time   COLORURINE  YELLOW 07/30/2017 1146   APPEARANCEUR CLEAR 07/30/2017 1146   LABSPEC 1.026 07/30/2017 1146   PHURINE 5.0 07/30/2017 1146   GLUCOSEU 50 (A) 07/30/2017 1146   HGBUR NEGATIVE 07/30/2017 1146   BILIRUBINUR NEGATIVE 07/30/2017 1146   KETONESUR NEGATIVE 07/30/2017 1146   PROTEINUR NEGATIVE 07/30/2017 1146   NITRITE NEGATIVE 07/30/2017 1146   LEUKOCYTESUR NEGATIVE 07/30/2017 1146   Urine Drug Screen     Component Value Date/Time   LABOPIA NONE DETECTED 07/28/2017 0415   COCAINSCRNUR NONE DETECTED 07/28/2017 0415   LABBENZ NONE DETECTED 07/28/2017 0415   AMPHETMU NONE DETECTED 07/28/2017 0415   THCU NONE DETECTED 07/28/2017 0415   LABBARB NONE DETECTED 07/28/2017 0415    Alcohol Level No results found for: [facility]  SIGNIFICANT DIAGNOSTIC STUDIES Ct Angio Head W Or Wo Contrast  Addendum Date: 07/27/2017   ADDENDUM REPORT: 07/27/2017 21:47 CONTRAST:  Total of 200 cc Omnipaque 370 administered. Electronically Signed   By: Kristine Garbe M.D.   On: 07/27/2017 21:47   Result Date: 07/27/2017 CLINICAL DATA:  42 y/o  M; right-sided numbness, acute stroke. EXAM: CT ANGIOGRAPHY HEAD AND NECK CT PERFUSION BRAIN TECHNIQUE: Multidetector CT imaging of the head and neck was performed using the standard protocol during bolus administration of intravenous contrast. Multiplanar CT image reconstructions and MIPs were obtained to evaluate the vascular anatomy. Carotid stenosis measurements (when applicable) are obtained utilizing NASCET criteria, using the distal internal carotid diameter as the denominator. Multiphase CT imaging of the brain was performed following IV bolus contrast injection. Subsequent parametric perfusion maps were calculated using RAPID software. CONTRAST:  See addendum. COMPARISON:  07/27/2016 CT head FINDINGS: CTA NECK FINDINGS Aortic arch: Standard branching. Imaged portion shows no evidence of aneurysm or dissection. No significant stenosis of the major arch vessel origins.  Right carotid system: No evidence of dissection, stenosis (50% or greater) or occlusion. Left carotid system: No evidence of dissection, stenosis (50% or greater) or occlusion. Vertebral arteries: Right dominant. No evidence of dissection, stenosis (50% or greater) or occlusion. Skeleton: Negative. Other neck: Nodules in bilateral lobes of thyroid measuring up to 2.6 cm on the right (series 7 image 149). Upper chest: Negative. Review of the MIP images confirms the above findings CTA HEAD FINDINGS Anterior circulation: Left proximal M1 occlusion with intermediate left MCA distribution collateralization. Otherwise no significant stenosis, proximal occlusion, aneurysm, or vascular malformation in the anterior circulation. Posterior circulation: No significant stenosis, proximal occlusion, aneurysm, or vascular malformation. Venous sinuses: As permitted by contrast timing, patent. Anatomic variants: Bilateral fetal PCA. Small anterior communicating artery. Review of the MIP images confirms the above findings CT Brain Perfusion Findings: CBF (<30%) Volume: 58m Perfusion (Tmax>6.0s) volume: 1547mMismatch  Volume: 20m Infarction Location:Left MCA distribution IMPRESSION: CTA head: 1. Left M1 occlusion with intermediate left MCA distribution collateralization. 2. Otherwise patent anterior and posterior intracranial circulation. No additional large vessel occlusion, aneurysm, or significant stenosis is identified. CTA neck: 1. Patent carotid and vertebral arteries. No dissection, aneurysm, or significant stenosis by NASCET criteria. 2. Nodules in bilateral lobes of thyroid measuring up to 2.6 cm on the right. Thyroid ultrasound is recommended on a nonemergent basis. CT perfusion head: By automated RAPID quantification: Infarct core 82 cc, ischemic penumbra 75 cc, infarct location in left MCA distribution. These results were called by telephone at the time of interpretation on 07/27/2017 at 9:23 pm to Dr. AAmie Portland, who  verbally acknowledged these results. Electronically Signed: By: LKristine GarbeM.D. On: 07/27/2017 21:39   Ct Head Wo Contrast  Result Date: 07/28/2017 CLINICAL DATA:  Follow-up suspected LEFT middle cerebral artery extravasation during incomplete thrombectomy. EXAM: CT HEAD WITHOUT CONTRAST TECHNIQUE: Contiguous axial images were obtained from the base of the skull through the vertex without intravenous contrast. COMPARISON:  Multiple CT HEAD May 27, 2018 FINDINGS: BRAIN: Increased density LEFT MCA distribution extending into LEFT insula and LEFT frontoparietal sulci measuring to 91 Hounsfield units. Faint density interpeduncular cistern measuring 50 Hounsfield units. Confluent LEFT frontotemporal parietal hypodensity extending to LEFT basal ganglia. No convincing evidence of intraparenchymal hemorrhage. Regional LEFT cerebrum mass effect without midline shift. No hydrocephalus. Basal cisterns are patent. 1 cm cyst in caudal aspect splenium of corpus callosum versus pineal cyst, less likely. VASCULAR: Dense LEFT MCA (55 Hounsfield units). New density along anterior cerebral artery distribution. SKULL/SOFT TISSUES: No skull fracture. No significant soft tissue swelling. ORBITS/SINUSES: The included ocular globes and orbital contents are normal.Mild paranasal sinusitis. Life-support lines in place. Mastoid air cells are well aerated. OTHER: None. IMPRESSION: 1. Evolving acute large LEFT MCA territory infarct without hemorrhagic conversion. 2. Postprocedural extra-axial contrast extravasation in addition to probable contrast staining and small volume subarachnoid hemorrhage. 3. New density anterior cerebral artery concerning for thromboembolism. Critical Value/emergent results were called by telephone at the time of interpretation on 07/28/2017 at 4:00 am to Dr. AAmie Portland, who verbally acknowledged these results. Electronically Signed   By: CElon AlasM.D.   On: 07/28/2017 04:16   Ct  Angio Neck W Or Wo Contrast  Addendum Date: 07/27/2017   ADDENDUM REPORT: 07/27/2017 21:47 CONTRAST:  Total of 200 cc Omnipaque 370 administered. Electronically Signed   By: LKristine GarbeM.D.   On: 07/27/2017 21:47   Result Date: 07/27/2017 CLINICAL DATA:  42y/o  M; right-sided numbness, acute stroke. EXAM: CT ANGIOGRAPHY HEAD AND NECK CT PERFUSION BRAIN TECHNIQUE: Multidetector CT imaging of the head and neck was performed using the standard protocol during bolus administration of intravenous contrast. Multiplanar CT image reconstructions and MIPs were obtained to evaluate the vascular anatomy. Carotid stenosis measurements (when applicable) are obtained utilizing NASCET criteria, using the distal internal carotid diameter as the denominator. Multiphase CT imaging of the brain was performed following IV bolus contrast injection. Subsequent parametric perfusion maps were calculated using RAPID software. CONTRAST:  See addendum. COMPARISON:  07/27/2016 CT head FINDINGS: CTA NECK FINDINGS Aortic arch: Standard branching. Imaged portion shows no evidence of aneurysm or dissection. No significant stenosis of the major arch vessel origins. Right carotid system: No evidence of dissection, stenosis (50% or greater) or occlusion. Left carotid system: No evidence of dissection, stenosis (50% or greater) or occlusion. Vertebral arteries: Right dominant. No evidence of  dissection, stenosis (50% or greater) or occlusion. Skeleton: Negative. Other neck: Nodules in bilateral lobes of thyroid measuring up to 2.6 cm on the right (series 7 image 149). Upper chest: Negative. Review of the MIP images confirms the above findings CTA HEAD FINDINGS Anterior circulation: Left proximal M1 occlusion with intermediate left MCA distribution collateralization. Otherwise no significant stenosis, proximal occlusion, aneurysm, or vascular malformation in the anterior circulation. Posterior circulation: No significant stenosis,  proximal occlusion, aneurysm, or vascular malformation. Venous sinuses: As permitted by contrast timing, patent. Anatomic variants: Bilateral fetal PCA. Small anterior communicating artery. Review of the MIP images confirms the above findings CT Brain Perfusion Findings: CBF (<30%) Volume: 10m Perfusion (Tmax>6.0s) volume: 1520mMismatch Volume: 7581mnfarction Location:Left MCA distribution IMPRESSION: CTA head: 1. Left M1 occlusion with intermediate left MCA distribution collateralization. 2. Otherwise patent anterior and posterior intracranial circulation. No additional large vessel occlusion, aneurysm, or significant stenosis is identified. CTA neck: 1. Patent carotid and vertebral arteries. No dissection, aneurysm, or significant stenosis by NASCET criteria. 2. Nodules in bilateral lobes of thyroid measuring up to 2.6 cm on the right. Thyroid ultrasound is recommended on a nonemergent basis. CT perfusion head: By automated RAPID quantification: Infarct core 82 cc, ischemic penumbra 75 cc, infarct location in left MCA distribution. These results were called by telephone at the time of interpretation on 07/27/2017 at 9:23 pm to Dr. ASHAmie Portlandwho verbally acknowledged these results. Electronically Signed: By: LanKristine GarbeD. On: 07/27/2017 21:39   Ct Cerebral Perfusion W Contrast  Addendum Date: 07/27/2017   ADDENDUM REPORT: 07/27/2017 21:47 CONTRAST:  Total of 200 cc Omnipaque 370 administered. Electronically Signed   By: LanKristine GarbeD.   On: 07/27/2017 21:47   Result Date: 07/27/2017 CLINICAL DATA:  41 10[age]  M; right-sided numbness, acute stroke. EXAM: CT ANGIOGRAPHY HEAD AND NECK CT PERFUSION BRAIN TECHNIQUE: Multidetector CT imaging of the head and neck was performed using the standard protocol during bolus administration of intravenous contrast. Multiplanar CT image reconstructions and MIPs were obtained to evaluate the vascular anatomy. Carotid stenosis measurements  (when applicable) are obtained utilizing NASCET criteria, using the distal internal carotid diameter as the denominator. Multiphase CT imaging of the brain was performed following IV bolus contrast injection. Subsequent parametric perfusion maps were calculated using RAPID software. CONTRAST:  See addendum. COMPARISON:  07/27/2016 CT head FINDINGS: CTA NECK FINDINGS Aortic arch: Standard branching. Imaged portion shows no evidence of aneurysm or dissection. No significant stenosis of the major arch vessel origins. Right carotid system: No evidence of dissection, stenosis (50% or greater) or occlusion. Left carotid system: No evidence of dissection, stenosis (50% or greater) or occlusion. Vertebral arteries: Right dominant. No evidence of dissection, stenosis (50% or greater) or occlusion. Skeleton: Negative. Other neck: Nodules in bilateral lobes of thyroid measuring up to 2.6 cm on the right (series 7 image 149). Upper chest: Negative. Review of the MIP images confirms the above findings CTA HEAD FINDINGS Anterior circulation: Left proximal M1 occlusion with intermediate left MCA distribution collateralization. Otherwise no significant stenosis, proximal occlusion, aneurysm, or vascular malformation in the anterior circulation. Posterior circulation: No significant stenosis, proximal occlusion, aneurysm, or vascular malformation. Venous sinuses: As permitted by contrast timing, patent. Anatomic variants: Bilateral fetal PCA. Small anterior communicating artery. Review of the MIP images confirms the above findings CT Brain Perfusion Findings: CBF (<30%) Volume: 74m27mrfusion (Tmax>6.0s) volume: 157mL53mmatch Volume: 75mL 75mrction Location:Left MCA distribution IMPRESSION: CTA head: 1. Left M1 occlusion with intermediate  left MCA distribution collateralization. 2. Otherwise patent anterior and posterior intracranial circulation. No additional large vessel occlusion, aneurysm, or significant stenosis is  identified. CTA neck: 1. Patent carotid and vertebral arteries. No dissection, aneurysm, or significant stenosis by NASCET criteria. 2. Nodules in bilateral lobes of thyroid measuring up to 2.6 cm on the right. Thyroid ultrasound is recommended on a nonemergent basis. CT perfusion head: By automated RAPID quantification: Infarct core 82 cc, ischemic penumbra 75 cc, infarct location in left MCA distribution. These results were called by telephone at the time of interpretation on 07/27/2017 at 9:23 pm to Dr. Amie Portland , who verbally acknowledged these results. Electronically Signed: By: Kristine Garbe M.D. On: 07/27/2017 21:39   Dg Chest Port 1 View Result Date: 07/28/2017 CLINICAL DATA:  42 year old male status post intubation. EXAM: PORTABLE CHEST 1 VIEW COMPARISON:  None. FINDINGS: An enteric tube is seen with side-port in the region of the gastroesophageal junction and tip in the proximal stomach. Recommend further advancing of the tube into the stomach by approximately 3-4 cm. A partially visualized structure with tip at the level of the T1 may represent the endotracheal tube. Clinical correlation is recommended. If an endotracheal tube is present recommend further advancing the tube by approximately 3 cm for optimal positioning. The lungs are clear. There is no pleural effusion or pneumothorax. The cardiac silhouette is within normal limits. No acute osseous pathology. IMPRESSION: 1. Enteric tube with tip in the stomach and side-port in the region of the gastroesophageal junction. Recommend advancing the tube further into the stomach for optimal positioning. A partially visualized density over the T1 may represent the tip of the endotracheal tube. If an endotracheal tube is present recommend further advancing by approximately 3 cm for optimal positioning. 2. No acute cardiopulmonary process. Electronically Signed   By: Anner Crete M.D.   On: 07/28/2017 01:18   Dg Chest Port 1 View Result  Date: 08/03/2017 IMPRESSION: No acute cardiopulmonary abnormality seen.   Ct Head Code Stroke Wo Contrast  Result Date: 07/27/2017 CLINICAL DATA:  Code stroke. 42 y/o M; right-sided weakness and facial droop. EXAM: CT HEAD WITHOUT CONTRAST TECHNIQUE: Contiguous axial images were obtained from the base of the skull through the vertex without intravenous contrast. COMPARISON:  None. FINDINGS: Brain: Faint lucency involving left insula and frontal operculum. No significant mass effect or hemorrhage at this time. No hydrocephalus, basilar cistern effacement, or extra-axial collection. Vascular: Dense left M1. Skull: Normal. Negative for fracture or focal lesion. Sinuses/Orbits: No acute finding. Other: None. ASPECTS [facility] Stroke Program Early CT Score) - Ganglionic level infarction (caudate, lentiform nuclei, internal capsule, insula, M1-M3 cortex): 5 - Supraganglionic infarction (M4-M6 cortex): 3 Total score (0-10 with 10 being normal): 8 IMPRESSION: 1. Left insula and frontal operculum lucency compatible with infarction. No hemorrhage or mass effect. 2. Left M1 hyperdensity, likely thrombus. 3. ASPECTS is 8 These results were called by telephone at the time of interpretation on 07/27/2017 at 9:10 pm to Dr. Rory Percy , who verbally acknowledged these results. Electronically Signed   By: Kristine Garbe M.D.   On: 07/27/2017 21:12   CT head 05/28/18 10am 1. Progressive left MCA territory infarct with further involvement of the left caudate head and lentiform nucleus. 2. Extensive nonhemorrhagic infarcts involving the left temporal tip, left frontal operculum, left parietal lobe, and medial left frontal lobe, the distal left ACA territory. 3. Contrast extravasation compatible with perforation into the left sylvian fissure, the sulci over the convexity, and the interpeduncular  notch. 4. Minimal midline shift. 5. Fluid in the sinuses likely secondary to intubation.  Transthoracic  Echocardiogram - Left ventricle: The cavity size was normal. Wall thickness was increased in a pattern of mild LVH. Systolic function was normal. The estimated ejection fraction was in the range of 60% to 65%. Wall motion was normal; there were no regional wall motion abnormalities. Left ventricular diastolic function parameters were normal. - Atrial septum: No defect or patent foramen ovale was identified.  Bilateral LE venous Dopplers - No evidence of deep vein thrombosis or baker's cystsbilaterally.   Ct Head Wo Contrast  Result Date: 07/29/2017 CLINICAL DATA:  Stroke. EXAM: CT HEAD WITHOUT CONTRAST TECHNIQUE: Contiguous axial images were obtained from the base of the skull through the vertex without intravenous contrast. COMPARISON:  MRI brain/12/19.  CT head 07/28/2017. FINDINGS: Brain: A large left MCA and ACA territory infarct is again noted. There is diffuse hypoattenuation involving affected areas of the left frontal and parietal lobe. The superior and anterior temporal lobe is involved. Contrast and hemorrhage within the sylvian fissure and along the left MCA is again noted. No new hemorrhage is present. There is increasing mass effect with effacement of the left lateral ventricle and now 10 mm of midline shift. There is increasing prominence of the right temporal horn compatible with mass effect on the lateral ventricles. Effacement of the para mesencephalic cistern on the right suggest developing uncal herniation. Brainstem and cerebellum are unremarkable. Vascular: Hyperdense left MCA and branches compatible with contrast and thrombus. Skull: Calvarium is intact. No focal lytic or blastic lesions are present. Sinuses/Orbits: Increased fluid is present in the left sphenoid sinus. There is mild mucosal thickening of the left maxillary sinus. Globes and orbits are within normal limits. IMPRESSION: 1. Evolving left MCA and ACA territory infarct increasing mass effect and midline  shift, now measuring 10 mm. 2. Mild increasing dilation of the right temporal tip compatible with left-sided effacement. 3. Developing uncal herniation without significant downward herniation of the brainstem. 4. Subarachnoid contrast and hemorrhage again noted within the sylvian fissure. Electronically Signed   By: San Morelle M.D.   On: 07/29/2017 06:40   Mr Brain Wo Contrast  Result Date: 07/28/2017 CLINICAL DATA:  Follow-up LEFT-sided stroke. Status post tPA July 27, 2017. EXAM: MRI HEAD WITHOUT CONTRAST TECHNIQUE: Multiplanar, multiecho pulse sequences of the brain and surrounding structures were obtained without intravenous contrast. COMPARISON:  CT HEAD July 28, 2017 at 0852 hours FINDINGS: BRAIN: Confluent LEFT frontotemporal parietal and LEFT basal ganglia reduced diffusion with involvement of the mesial LEFT parietal and frontal lobes. Low ADC values. Susceptibility artifact within LEFT sylvian fissure and LEFT frontal parietal sulci. No intraparenchymal hemorrhage. Worsening 8 mm LEFT-to-RIGHT midline shift, increased from 2 mm. Partially effaced LEFT lateral ventricle with early RIGHT lateral ventricle entrapment. 11 mm T2 bright simple cyst within or adjacent to splenium of corpus callosum. No significant extra-axial fluid accumulation. VASCULAR: Normal major intracranial vascular flow voids present at skull base. SKULL AND UPPER CERVICAL SPINE: No abnormal sellar expansion. No suspicious calvarial bone marrow signal. Craniocervical junction maintained. SINUSES/ORBITS: Secretions layering in scratches at layering secretions in paranasal sinuses. Life-support lines in place. Mastoid air cells are well aerated. The included ocular globes and orbital contents are non-suspicious. OTHER: None. IMPRESSION: 1. Evolving acute large LEFT MCA territory nonhemorrhagic infarct. Evolving acute distal LEFT ACA territory nonhemorrhagic infarct. 2. Worsening 8 mm LEFT-to-RIGHT midline shift with  early RIGHT ventricle entrapment. 3. Susceptibility artifact LEFT sylvian fissure and  LEFT sulci corresponding to known extra-axial hemorrhage. Acute findings text paged to [place] via AMION secure system on 07/28/2017 at 10:37 pm. Electronically Signed   By: Elon Alas M.D.   On: 07/28/2017 22:38   Ct Head Wo Contrast  Result Date: 07/31/2017 CLINICAL DATA:  Stroke, follow-up craniotomy. EXAM: CT HEAD WITHOUT CONTRAST TECHNIQUE: Contiguous axial images were obtained from the base of the skull through the vertex without intravenous contrast. COMPARISON:  CT HEAD July 29, 2017 and MRI of the head July 28, 2017 FINDINGS: BRAIN: Mild external herniation LEFT cerebrum via craniectomy defect. Evolving large LEFT acute to subacute MCA territory infarct without hemorrhagic conversion. Moderate LEFT frontoparietal ACA territory infarct. Regional mass effect without midline shift. Persistent partial LEFT lateral ventricle effacement. Resolving RIGHT ventricular entrapment. Mild residual LEFT uncal herniation. Patent basal cisterns. VASCULAR: Dense LEFT MCA consistent with thromboembolism and, potentially trapped extravasated contrast. Focally dense LEFT ACA. SKULL/SOFT TISSUES: Interval LEFT decompressive craniectomy. LEFT scalp soft tissue swelling, subcutaneous gas with overlying skin staples. ORBITS/SINUSES: The included ocular globes and orbital contents are normal.Mild paranasal sinus mucosal thickening. LEFT sphenoid sinus air-fluid level. Mastoid air cells are well aerated. OTHER: Life-support lines in place. IMPRESSION: 1. Evolving large LEFT MCA and moderate LEFT ACA territory infarct without hemorrhagic conversion. 2. Interval decompressive LEFT craniectomy with mild external herniation of LEFT cerebrum. Resolution of midline shift. 3. Resolving RIGHT ventricular entrapment. Electronically Signed   By: Elon Alas M.D.   On: 07/31/2017 04:26   Ct Head Wo Contrast  Result Date:  08/02/2017 CLINICAL DATA:  42 y/o  M; left MCA and ACA infarcts for follow-up. EXAM: CT HEAD WITHOUT CONTRAST TECHNIQUE: Contiguous axial images were obtained from the base of the skull through the vertex without intravenous contrast. COMPARISON:  07/31/2016 CT head FINDINGS: Brain: Stable distribution of left MCA and ACA infarctions. Stable edema with herniation of the left cerebral convexity with large craniectomy defect, effacement of left lateral ventricle, left uncal herniation, and 4 mm left-to-right midline shift. No downward herniation identified. Stable right ventricle size. No new stroke. No interval hemorrhage. Vascular: Hyperdensity within the left M1 and distal vessels likely representing thrombus. Skull: Postsurgical changes related to left hemi craniectomy stable edema in the overlying scalp and skin staples. Sinuses/Orbits: Sphenoid sinus opacification with fluid levels, partial opacification of left mastoid air cells, and left maxillary sinus mucosal thickening, probably due to intubation. Orbits are unremarkable. Other: None. IMPRESSION: 1. Stable distribution of left MCA and ACA infarctions. No new infarction or hemorrhage. 2. Stable mass effect with 4 mm left-to-right midline shift and mild herniation via craniectomy. Electronically Signed   By: Kristine Garbe M.D.   On: 08/02/2017 05:06   Transcranial Doppler with Bubbles 08/04/2017 Negative for PFO  CT Head  08/05/2017 IMPRESSION: 1. Slightly improved midline shift in the setting of large left ACA/MCA territory infarct status post decompressive craniectomy. 2. Areas of curvilinear hyperdensity within the distribution of the infarction are favored to be mostly vascular, though there may be an element of petechial blood superimposed. However,, there is no intraparenchymal hematoma formation. 3. No hydrocephalus.    HISTORY OF [place] COURSE ASSESSMENT/PLAN Jeremy [name] is a 42 y.o. male with  history of HTN, OSA presenting with right-sided weakness, left gaze and right neglect. He receive IV t-PA.  However, unsuccessful for mechanical thrombectomy with complication of left SAH.  Stroke: Left large MCA infarct status post TPA with unsuccessful IR attempt causing  left SAH.  Resultant  left gaze, right hemiplegia, right neglect  CT head left MCA  hyperdense  CT head and neck left MCA occlusion  DSA with unsuccessful IR attempt causing left SAH  CT repeat showed left MCA large infarct with left stable SAH and stable subtle midline shift  MRI head large left MCA and ACA infarct with midline shift  Repeat CT 07/31/17 - much improved midline shift after hemicrani  2D Echo - EF 60-65%  LE venous Doppler no DVT   TEE attempted 08/06/17 but unable to perform  LDL NTC due to high TG  HgbA1c - 5.2  UDS negative  VTE prophylaxis -SCDs  Fall precautions  DIET - DYS 1 Room service appropriate? Yes; Fluid consistency: Nectar Thick  No antithrombotic prior to admission, now on ASA 335m.   Ongoing aggressive stroke risk factor management  Therapy recommendations:  CIR  Disposition:  CLR  Cerebral edema s/p decompressive hemicrani  CT head showed large left MCA infarct  MRI large left MCA and ACA infarcts with midline shift  CT repeat - increasing midline shift  CT repeat 07/31/17 - mild midline shift  Repeat CT 08/02/17 - stable midline shift  3% saline discontinued 08/05/17   Na 144->150->153->152->153->150  Sodium goal 150 - 155  Neurosurgery on board - s/p hemi craniectomy   Fever - likely related to surgery, resolved  Afebrile for the last 24 hours  On temperature control protocol PRN  Off arctic sun  WBC 11.2->10.6->11.2->9.9  Hypertension  Stable but elevated than before  Likely related to increased ICP  Labetalol IV PRN  BP [facility] < [facility]  Home meds: None  LDL NTC due to high TG, goal < 70  TG 417  On  zetia and lipitor  Continue on discharge  B12 deficiency  B12 = 161   B12 im supplement daily x 7 days  Other Stroke Risk Factors  Obstructive sleep apnea  Other Active Problems  TSH mildly low but FT4 normal  Elevated ESR and rheumatoid factor-unclear significance. No clinical h/o rhematoid disease  DISCHARGE EXAM Blood pressure (!) 146/99, pulse 88, temperature 97.9 F (36.6 C), temperature source Oral, resp. rate 18, height _0  (1.778 m), weight 85.2 kg (187 lb 13.3 oz), SpO2 96 %. General - Well nourished, well developed, intubated. Cardiovascular - Regular rate and rhythm. Neuro - Awake, left eyelid and scalp swelling improved, speech is soft and can be understood without difficulty. Able to speak sentences. Able to name and repeat. No aphasia. PERRL, left gaze preference, barely cross midline, blinking to visual threat on the left, but not able to the left.  Able to follow all commands.  Facial asymmetry with right lower facial mild weakness .  Left upper extremity and lower extremity at least 4+/5, right upper and lower extremity dense hemiplegia.  DTR diminished on the right, no Babinski.  Sensation and coordination not cooperative.  Gait not tested.  Discharge Diet  Fall precautions DIET - DYS 1 Room service appropriate? Yes; Fluid consistency: Nectar Thick liquids  DISCHARGE PLAN  Disposition:  Transfer to [facility] ongoing PT, OT and ST  aspirin 325 mg daily for secondary stroke prevention.repeat ESR and rheumatoid factor as outpatient  Recommend ongoing risk factor control by Primary Care Physician at time of discharge from inpatient rehabilitation.  Follow-up HMaryland Pink MD in 2 weeks following discharge from rehab.  Follow-up with Dr. [facility] in 6 weeks,  office to schedule an appointment.  Greater than 40  minutes were spent preparing discharge.  Mary Sella, ANP-C Neurology Stroke  Team 08/07/2017 12:35PM I have personally examined this patient, reviewed notes, independently viewed imaging studies, participated in medical decision making and plan of care.ROS completed by me personally and pertinent positives fully documented  I have made any additions or clarifications directly to the above note. Agree with note above.    Antony Contras, MD Medical Director [facility] Pager: [phone] 08/07/2017 1:22 PM

## 2017-08-08 ENCOUNTER — Inpatient Hospital Stay (HOSPITAL_COMMUNITY): Payer: BLUE CROSS/BLUE SHIELD

## 2017-08-08 ENCOUNTER — Encounter (HOSPITAL_COMMUNITY): Payer: Self-pay | Admitting: Interventional Radiology

## 2017-08-08 ENCOUNTER — Inpatient Hospital Stay (HOSPITAL_COMMUNITY): Payer: BLUE CROSS/BLUE SHIELD | Admitting: Physical Therapy

## 2017-08-08 ENCOUNTER — Inpatient Hospital Stay (HOSPITAL_COMMUNITY): Payer: BLUE CROSS/BLUE SHIELD | Admitting: Occupational Therapy

## 2017-08-08 ENCOUNTER — Inpatient Hospital Stay (HOSPITAL_COMMUNITY): Payer: BLUE CROSS/BLUE SHIELD | Admitting: Speech Pathology

## 2017-08-08 DIAGNOSIS — I69351 Hemiplegia and hemiparesis following cerebral infarction affecting right dominant side: Principal | ICD-10-CM

## 2017-08-08 LAB — COMPREHENSIVE METABOLIC PANEL
ALK PHOS: 96 U/L (ref 38–126)
ALT: 139 U/L — ABNORMAL HIGH (ref 17–63)
AST: 63 U/L — AB (ref 15–41)
Albumin: 3 g/dL — ABNORMAL LOW (ref 3.5–5.0)
Anion gap: 11 (ref 5–15)
BILIRUBIN TOTAL: 1.1 mg/dL (ref 0.3–1.2)
BUN: 18 mg/dL (ref 6–20)
CHLORIDE: 103 mmol/L (ref 101–111)
CO2: 25 mmol/L (ref 22–32)
CREATININE: 0.96 mg/dL (ref 0.61–1.24)
Calcium: 8.8 mg/dL — ABNORMAL LOW (ref 8.9–10.3)
GFR calc Af Amer: >60 mL/min (ref >60)
Glucose, Bld: 138 mg/dL — ABNORMAL HIGH (ref 65–99)
Potassium: 4.1 mmol/L (ref 3.5–5.1)
Sodium: 139 mmol/L (ref 135–145)
Total Protein: 7.1 g/dL (ref 6.5–8.1)

## 2017-08-08 LAB — CBC
HCT: 33.1 % — ABNORMAL LOW (ref 39.0–52.0)
HEMOGLOBIN: 11.1 g/dL — AB (ref 13.0–17.0)
MCH: 30.1 pg (ref 26.0–34.0)
MCHC: 33.5 g/dL (ref 30.0–36.0)
MCV: 89.7 fL (ref 78.0–100.0)
PLATELETS: 440 10*3/uL — AB (ref 150–400)
RBC: 3.69 MIL/uL — AB (ref 4.22–5.81)
RDW: 12.7 % (ref 11.5–15.5)
WBC: 10.8 10*3/uL — ABNORMAL HIGH (ref 4.0–10.5)

## 2017-08-08 LAB — CBC WITH DIFFERENTIAL/PLATELET
Basophils Absolute: 0 10*3/uL (ref 0.0–0.1)
Basophils Relative: 0 %
Eosinophils Absolute: 0.2 10*3/uL (ref 0.0–0.7)
Eosinophils Relative: 2 %
HEMATOCRIT: 35.4 % — AB (ref 39.0–52.0)
HEMOGLOBIN: 11.7 g/dL — AB (ref 13.0–17.0)
Lymphocytes Relative: 19 %
Lymphs Abs: 2.3 10*3/uL (ref 0.7–4.0)
MCH: 29.8 pg (ref 26.0–34.0)
MCHC: 33.1 g/dL (ref 30.0–36.0)
MCV: 90.1 fL (ref 78.0–100.0)
MONO ABS: 0.8 10*3/uL (ref 0.1–1.0)
MONOS PCT: 7 %
NEUTROS ABS: 9 10*3/uL — AB (ref 1.7–7.7)
NEUTROS PCT: 72 %
Platelets: 527 10*3/uL — ABNORMAL HIGH (ref 150–400)
RBC: 3.93 MIL/uL — ABNORMAL LOW (ref 4.22–5.81)
RDW: 12.8 % (ref 11.5–15.5)
WBC: 12.3 10*3/uL — ABNORMAL HIGH (ref 4.0–10.5)

## 2017-08-08 LAB — CREATININE, SERUM
Creatinine, Ser: 0.9 mg/dL (ref 0.61–1.24)
GFR calc Af Amer: >60 mL/min (ref >60)
GFR calc non Af Amer: >60 mL/min (ref >60)

## 2017-08-08 NOTE — Progress Notes (Signed)
Patient information reviewed and entered into eRehab system by Antha Niday, RN, CRRN, PPS Coordinator.  Information including medical coding and functional independence measure will be reviewed and updated through discharge.    

## 2017-08-08 NOTE — Progress Notes (Signed)
Physical Medicine and Rehabilitation Consult Reason for Consult: Right side weakness Referring Physician: Dr.Xu   HPI: Jeremy [name] is a 42 y.o. right handed male with history of hypertension on no antihypertensive medications. Presented 07/27/2017 with right-sided weakness facial droop and slurred speech. Per chart review patient lives with spouse. One level home/third third floor apartment. Independent and working prior to admission. Cranial CT scan showed left insula and frontal operculum lucency compatible with infarction. CT Angiography head and neck showed left M1 occlusion with intermediate left MCA distribution collateralization. Underwent mechanical thrombectomy per interventional radiology was unsuccessful and required intubation . Follow-up MRI showed cerebral edema with midline shift maintained on 3% saline. Underwent left decompressive hemicraniectomy placement of bone flap abdominal pocket 07/29/2017 per Dr. [name]. Remains on Cleviprex. NPO with tube feeds for nutritional support. She was extubated 06/02/2018 Follow-up CT 08/03/2017 stable no new changes. Subcutaneous Lovenox was later initiated for DVT prophylaxis. Physical therapy evaluation completed with recommendations of physical medicine rehabilitation consult  Review of Systems  Unable to perform ROS: Acuity of condition       Past Medical History:  Diagnosis Date  . Anxiety   . Asthma    MILD AS A CHILD  . Depression   . Deviated septum   . Dyspnea    NORMAL SPIROMETRY 02/01/17 VISIT WITH DR HEDRICK  . Hypertension   . Sleep apnea         Past Surgical History:  Procedure Laterality Date  . CRANIOTOMY Left 07/29/2017   Procedure: LEFT HEMI- CRANIECTOMY WITH PLACEMENT OF BONE FLAP IN ABDOMEN;  Surgeon: Lisbeth Renshaw, MD;  Location: [facility] OR;  Service: Neurosurgery;  Laterality: Left;  . IR ANGIO INTRA EXTRACRAN SEL COM CAROTID INNOMINATE UNI R MOD SED  07/28/2017  . IR ANGIO VERTEBRAL SEL VERTEBRAL  UNI R MOD SED  07/28/2017  . IR PERCUTANEOUS ART THROMBECTOMY/INFUSION INTRACRANIAL INC DIAG ANGIO  07/28/2017  . NASAL SEPTOPLASTY W/ TURBINOPLASTY Bilateral 03/06/2017   Procedure: NASAL SEPTOPLASTY WITH TURBINATE REDUCTION;  Surgeon: Linus Salmons, MD;  Location: [facility] ORS;  Service: ENT;  Laterality: Bilateral;  . RADIOLOGY WITH ANESTHESIA N/A 07/27/2017   Procedure: RADIOLOGY WITH ANESTHESIA;  Surgeon: Julieanne Cotton, MD;  Location: [facility] OR;  Service: Radiology;  Laterality: N/A;   History reviewed. No pertinent family history. Social History:  reports that  has never smoked. he has never used smokeless tobacco. He reports that he drinks alcohol. He reports that he does not use drugs. Allergies:       Allergies  Allergen Reactions  . Blood-Group Specific Substance     PT JEHOVAHS WITNESS DOES NOT ACCEPT BLOOD PRODUCTS         Medications Prior to Admission  Medication Sig Dispense Refill  . spironolactone (ALDACTONE) 100 MG tablet Take 100 mg by mouth daily.    Marland Kitchen amLODipine (NORVASC) 10 MG tablet Take 10 mg by mouth at bedtime.  3  . KLOR-CON M20 20 MEQ tablet Take 40 mEq by mouth daily.  11  . oxyCODONE-acetaminophen (ROXICET) 5-325 MG tablet Take 1-2 tablets by mouth every 4 (four) hours as needed for severe pain. 40 tablet 0  . sulfamethoxazole-trimethoprim (BACTRIM DS,SEPTRA DS) 800-160 MG tablet Take 1 tablet by mouth 2 (two) times daily. (Patient not taking: Reported on 07/28/2017) 20 tablet 0  . triamterene-hydrochlorothiazide (MAXZIDE-25) 37.5-25 MG tablet Take 1 tablet by mouth daily.  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 Max assist to bring legs off of bed and elevate trunk Transfers Overall transfer level: Needs assistance Transfers: Sit to/from Stand, Squat Pivot Transfers Sit to Stand: Mod assist, +2 physical assistance Squat pivot transfers: Mod assist General transfer comment: mod +2 to stand from elevated surface with right knee blocked. Pt able to stand 2 min with bil UE support. Pt maintaining weight on LLE with knee blocked and assist able to shift weight to right without active quad activation on RLE. Marland Kitchen Squat pivot to left with cues for sequence and mod assist to direct trunk and pelvis with over the back technique Ambulation/Gait General Gait Details: unable  ADL:  Cognition: Cognition Overall Cognitive Status: Impaired/Different from baseline Orientation Level: Oriented X4 Cognition Arousal/Alertness: Awake/alert Behavior During Therapy: WFL for tasks assessed/performed Overall Cognitive Status: Impaired/Different from baseline Area of Impairment: Attention, Safety/judgement, Problem solving, Following commands Following Commands: Follows one step commands consistently, Follows one step commands with increased time Safety/Judgement: Decreased awareness of safety, Decreased awareness of deficits Problem Solving: Slow processing, Decreased initiation, Difficulty sequencing, Requires verbal cues, Requires tactile cues  Blood pressure  (!) 145/97, pulse 74, temperature 99.1 F (37.3 C), temperature source Oral, resp. rate 19, height 5\' 10"  (1.778 m), weight 85.7 kg (188 lb 15 oz), SpO2 98 %. Physical Exam  Vitals reviewed. HENT:  Craniotomy site clean and dry  Eyes:  Pupils reactive to light  Neck: Normal range of motion. Neck supple. No thyromegaly present.  Cardiovascular: Normal rate and regular rhythm.  Respiratory: Effort normal and breath sounds normal. No respiratory distress.  GI: Soft. Bowel sounds are normal. He exhibits no distension.  Neurological:  Patient with left gaze preference. He was essentially nonverbal but would state his name.  He is very alert.  Right upper extremity and right lower extremity 0 out of 5 proximal to distal.  Does seem to sense gross pain.  Right central 7 and tongue deviation.  Also difficulty AB duct and right eye.    Skin: Skin is warm and dry.  Assessment/Plan: Diagnosis: Dense right hemiparesis and aphasia due to left MCA infarct 1. Does the need for close, 24 hr/day medical supervision in concert with the patient's rehab needs make it unreasonable for this patient to be served in a less intensive setting? Yes 2. Co-Morbidities requiring supervision/potential complications: Wound care, pain management, post stroke sequelae 3. Due to bladder management, bowel management, safety, skin/wound care, disease management, medication administration, pain management and patient education, does the patient require 24 hr/day rehab nursing? Yes 4. Does the patient require coordinated care of a physician, rehab nurse, PT (1-2 hrs/day, 5 days/week), OT (1-2 hrs/day, 5 days/week) and SLP (1-2 hrs/day, 5 days/week) to address physical and functional deficits in the context of the above medical diagnosis(es)? Yes Addressing deficits in the following areas: balance,  endurance, locomotion, strength, transferring, bowel/bladder control, bathing, dressing, feeding, grooming, toileting, cognition, speech,  language, swallowing and psychosocial support 5. Can the patient actively participate in an intensive therapy program of at least 3 hrs of therapy per day at least 5 days per week? Yes 6. The potential for patient to make measurable gains while on inpatient rehab is excellent 7. Anticipated functional outcomes upon discharge from inpatient rehab are supervision and min assist  with PT, supervision and min assist with OT, supervision and min assist with SLP. 8. Estimated rehab length of stay to reach the above functional goals is: 24-30 days 9. Anticipated D/C setting: Home 10. Anticipated post D/C treatments: HH therapy and Outpatient therapy 11. Overall Rehab/Functional Prognosis: excellent  RECOMMENDATIONS: This patient's condition is appropriate for continued rehabilitative care in the following setting: CIR Patient has agreed to participate in recommended program. Yes Note that insurance prior authorization may be required for reimbursement for recommended care.  Comment: Rehab Admissions Coordinator to follow up.  Thanks,  Ranelle OysterZachary T. Swartz, MD, [place] DomFAAPMR    Mcarthur Rossettianiel J Angiulli, PA-C 08/03/2017          Revision History                        Routing History

## 2017-08-08 NOTE — Progress Notes (Signed)
Arrived on unit alert and responsive with wife at about 2000. No s/s of distress.

## 2017-08-08 NOTE — Patient Care Conference (Signed)
Inpatient RehabilitationTeam Conference and Plan of Care Update Date: 08/08/2017   Time: 10:55 AM    Patient Name: Jeremy [name]      Medical Record Number: [id number]  Date of Birth: February 20, 1976 Sex: Male         Room/Bed: 4W15C/4W15C-01 Payor Info: Payor: BLUE CROSS BLUE SHIELD / Plan: BCBS OTHER / Product Type: *No Product type* /    Admitting Diagnosis: Stroke CVA  Admit Date/Time:  08/07/2017  8:36 PM Admission Comments: No comment available   Primary Diagnosis:  <principal problem not specified> Principal Problem: <principal problem not specified>  Patient Active Problem List   Diagnosis Date Noted  . 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 Aphasia due to acute stroke (HCC) 08/07/2017  . B12 deficiency   . Acute respiratory failure (HCC)   . Central line infiltration (HCC)   . Endotracheal tube present   . Fever   . Leukocytosis   . S/P craniotomy 07/30/2017  . Cerebral edema (HCC) 07/28/2017  . Hyperlipidemia 07/28/2017  . Stroke (cerebrum) (HCC) 07/27/2017    Expected Discharge Date:    Team Members Present: Physician leading conference: Dr. Claudette LawsAndrew Kirsteins Social Worker Present: Dossie DerBecky Reginia Battie, LCSW Nurse Present: Keturah BarreEd Knisley, RN PT Present: Grier RocherAustin Tucker, PT OT Present: Johnsie CancelAmy Lewis, OT SLP Present: Reuel DerbyHappi Overton, SLP PPS Coordinator present : Tora DuckMarie Noel, RN, CRRN     Current Status/Progress Goal Weekly Team Focus  Medical     nausea-addressing with nursing-meds and bowel program   medically stability     Bowel/Bladder   Incontinent of bladder and continent of bowel.  Continent of B&B.  Keep patient clean and dry.   Swallow/Nutrition/ Hydration   dysphagia 1 with nectar thick liquids  Supervision with least restrictive diet  Mod A to Max A with trials of puree and nectar thick liquids   ADL's   max- total A +2 overall; flaccid R UE  Min A overall  Functional sitting balance, upright tolerance, neuro re-ed, functional transfers   Mobility     new  evaluation-pending        Communication   appears cognitively based - no word finding deficits  Min A to supervision  expanding utterance length   Safety/Cognition/ Behavioral Observations  Max A with sustained attention, task initiation, basic problem solving, right inattention  Min A  task initiation, basic problem solving, attention   Pain   Denies pain  <3.  Assess q shift and PRN.   Skin   Surgical inscions well approximated with no s/s of infection. Staples intact.  Maintain skin integrity.  Assess q shift and PRN.      *See Care Plan and progress notes for long and short-term goals.     Barriers to Discharge  Current Status/Progress Possible Resolutions Date Resolved   Physician                    Nursing  Other (comments)  Wife  6 months pregnant.            PT  Inaccessible home environment;Decreased caregiver support;Medical stability;Home environment access/layout;Insurance for SNF coverage;Nutrition means;Behavior                 OT Decreased caregiver support;Insurance for SNF coverage  Wife is pregnant, due in two months. Plans for pt's mother to come be primary caregiver             SLP Decreased caregiver support;Inaccessible home environment  SW                Discharge Planning/Teaching Needs:    Home with Mom and wife to provide 24 hr care.      Team Discussion:  New patient being evaluated in all areas. Hopeful goal of min assist wheelchair level. Dense hemiplegia on right side. Nausea today-nursing working on bowels.   Revisions to Treatment Plan:  New eval set discharge date next week       Yasser Hepp, Lemar Livings 08/09/2017, 8:36 AM

## 2017-08-08 NOTE — Progress Notes (Signed)
PMR Admission Coordinator Pre-Admission Assessment  Patient: Jeremy [name] is an 42 y.o., male MRN: [id number] DOB: 07-Jan-1976 Height: 5\' 10"  (177.8 cm) Weight: 85.2 kg (187 lb 13.3 oz)                                                                                                                                                  Insurance Information HMO:     PPO:      PCP:      IPA:      80/20:      OTHER: Blue Option with [employer]             PRIMARY: BCBS of       Policy#: [id number]      Subscriber: Self CM Name: Carollee Herter      Phone#: [phone]     Fax#: [phone] Pre-Cert#: [id number] for 08/06/17-08/21/17 with faxed updates due by 2/4     Employer: Full Time Benefits:  Phone #: [phone]     Name: Verified online at [email or web address] Eff. Date: 07/17/17     Deduct: $750      Out of Pocket Max: $4000      Life Max: N/A CIR: 80%/20%      SNF: 80%/20% Outpatient: PT/OT 40 visits, SLP 40 visits      Co-Pay: $30 Home Health: Necessity 80%      Co-Pay: 20% DME: 80%     Co-Pay: 20% Providers: In-network   SECONDARY: None      Policy#:       Subscriber:  CM Name:       Phone#:      Fax#:  Pre-Cert#:       Employer:  Benefits:  Phone #:      Name:  Eff. Date:      Deduct:       Out of Pocket Max:       Life Max:  CIR:       SNF:  Outpatient:      Co-Pay:  Home Health:       Co-Pay:  DME:      Co-Pay:   Medicaid Application Date:       Case Manager:  Disability Application Date:       Case Worker:   Emergency Contact Information        Contact Information    Name Relation Home Work Mobile   Marconi,Melanie Spouse [phone]       Current Medical History  Patient Admitting Diagnosis: Dense right hemiparesis and aphasia due to left MCA infarct  History of Present Illness: Jeremy [name] a 42 y.o.right handed malewith history of hypertension on no antihypertensive medications. Presented 07/27/2017 with right-sided weakness, headache,facial droop and  slurred speech. Per chart review patient lives with spouse.They have a 83-year-old son as well as wife is currently pregnant.One  level home/thirdthird floor apartmentand they are currently seeking an apartment on the ground-level.Good family support in the area.Independent and working prior to admission. Cranial CT scan showed left insula and frontal operculum lucency compatible with infarction.echocardiogram with ejection fraction of 65% no wall motion abnormalities. No defect or PFO identified. CT Angiography head and neck showed left M1 occlusion with intermediate left MCA distribution collateralization. Underwentattemptedmechanical thrombectomy per interventional radiologywhichwas unsuccessful and required intubation. Follow-up MRI showed cerebral edema with midline shift maintained on 3% saline. Underwent left decompressive hemicraniectomy placement of bone flap abdominal pocket 07/29/2017 per Dr.Nundkumar.Initially maintained onCleviprex.Patient was extubated 06/02/2018. NPOwith tube feeds for nutritional supportand diet advanced to a dysphagia 1 nectar-thick liquid diet. Follow-up CT 08/03/2017 stable no new changes.Bilateral lower extremity Dopplers negative.TEE was attempted 08/06/2017 but unable to perform.No plan for loop recorder.Subcutaneous Lovenox was later initiated for DVT prophylaxis. Physicaland occupationaltherapy evaluationscompletedwithrecommendations for a physical medicine rehabilitation consult.Patient was admitted for a comprehensive rehabilitation program 08/07/17.  NIH Total: 18  Past Medical History      Past Medical History:  Diagnosis Date  . Anxiety   . Asthma    MILD AS A CHILD  . Depression   . Deviated septum   . Dyspnea    NORMAL SPIROMETRY 02/01/17 VISIT WITH DR HEDRICK  . Hypertension   . Sleep apnea     Family History  family history is not on file.  Prior Rehab/Hospitalizations:  Has the patient had major surgery  during 100 days prior to admission? No  Current Medications   Current Facility-Administered Medications:  .  0.9 %  sodium chloride infusion, 250 mL, Intravenous, PRN, Tobey Grim, NP .  acetaminophen (TYLENOL) tablet 650 mg, 650 mg, Oral, Q4H PRN, 650 mg at 08/03/17 2227 **OR** acetaminophen (TYLENOL) solution 650 mg, 650 mg, Per Tube, Q4H PRN, 650 mg at 08/02/17 0113 **OR** acetaminophen (TYLENOL) suppository 650 mg, 650 mg, Rectal, Q4H PRN, Marvel Plan, MD .  aspirin tablet 325 mg, 325 mg, Per Tube, Daily, Marvel Plan, MD, 325 mg at 08/07/17 0913 .  atorvastatin (LIPITOR) tablet 40 mg, 40 mg, Oral, q1800, Marvel Plan, MD, 40 mg at 08/06/17 1754 .  bisacodyl (DULCOLAX) suppository 10 mg, 10 mg, Rectal, Daily PRN, Selmer Dominion B, NP, 10 mg at 08/01/17 0817 .  Chlorhexidine Gluconate Cloth 2 % PADS 6 each, 6 each, Topical, Daily, Marvel Plan, MD, 6 each at 08/06/17 1050 .  enoxaparin (LOVENOX) injection 40 mg, 40 mg, Subcutaneous, Q24H, Marvel Plan, MD, 40 mg at 08/06/17 2257 .  ezetimibe (ZETIA) tablet 10 mg, 10 mg, Oral, Daily, Marvel Plan, MD, 10 mg at 08/07/17 0913 .  Gerhardt's butt cream, , Topical, BID, Sethi, Pramod S, MD .  insulin aspart (novoLOG) injection 1-3 Units, 1-3 Units, Subcutaneous, Q4H, Simpson, Paula B, NP, 1 Units at 08/06/17 1600 .  labetalol (NORMODYNE,TRANDATE) injection 5-10 mg, 5-10 mg, Intravenous, Q10 min PRN, Marvel Plan, MD, 10 mg at 08/02/17 1236 .  lidocaine (PF) (XYLOCAINE) 1 % injection 0-30 mL, 0-30 mL, Intradermal, Once PRN, Lupita Leash, MD .  MEDLINE mouth rinse, 15 mL, Mouth Rinse, BID, Jimmye Norman, MD, 15 mL at 08/06/17 2129 .  pantoprazole (PROTONIX) EC tablet 40 mg, 40 mg, Oral, Daily, Aroor, Dara Lords, MD, 40 mg at 08/07/17 0913 .  potassium chloride 20 MEQ/15ML (10%) solution 40 mEq, 40 mEq, Oral, BID, Micki Riley, MD, 40 mEq at 08/07/17 0913 .  senna-docusate (Senokot-S) tablet 1 tablet, 1 tablet, Oral, QHS PRN, Milon Dikes,  MD .  sodium chloride flush (NS) 0.9 % injection 10-40 mL, 10-40 mL, Intracatheter, Q12H, Marvel PlanXu, Jindong, MD, 10 mL at 08/05/17 1946 .  sodium chloride flush (NS) 0.9 % injection 10-40 mL, 10-40 mL, Intracatheter, PRN, Marvel PlanXu, Jindong, MD  Patients Current Diet: Fall precautions DIET - DYS 1 Room service appropriate? 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    Home Assistive Devices / Equipment Home Equipment: None  Prior Device Use: Indicate devices/aids used by the patient prior to current illness, exacerbation or injury? None of the above  Prior Functional Level Prior Function Level of Independence: Independent Comments: pt worked with Conservation officer, 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 and lives with wife and 8yo son  Self Care: Did the patient need help bathing, dressing, using the toilet or eating? Independent  Indoor Mobility: Did the patient need assistance with walking from room to room (with or without device)? Independent  Stairs: Did the patient need assistance with internal or external stairs (with or without device)? Independent  Functional Cognition: Did the patient need help planning regular tasks such as shopping or remembering to take medications? Independent  Current Functional Level Cognition  Arousal/Alertness: Awake/alert Overall Cognitive Status: Impaired/Different from baseline Current Attention Level: Selective Orientation Level: Oriented X4 Following Commands: Follows one step commands consistently, Follows multi-step commands with increased time, Follows one step commands with increased time Safety/Judgement: Decreased awareness  of deficits, Decreased awareness of safety(R sided inattention) General Comments: Pt continues to require increased time and cues throughout session. Pt requiring Max cues to attend to R side of body and environment. Attention: Focused, Sustained, Selective Focused Attention: Appears intact Sustained Attention: Impaired Sustained Attention Impairment: Verbal basic, Functional basic Selective Attention: Impaired Selective Attention Impairment: Verbal basic, Functional basic Memory: Impaired Memory Impairment: Decreased recall of new information, Decreased short term memory Decreased Short Term Memory: Verbal basic(delayed recall 1/4) Awareness: Impaired Awareness Impairment: Emergent impairment Problem Solving: Impaired Problem Solving Impairment: Functional basic(errors in simple subtraction) Executive Function: Reasoning Reasoning: Impaired Reasoning Impairment: Verbal complex Safety/Judgment: Impaired Comments: left gaze preference, R neglect    Extremity Assessment (includes Sensation/Coordination)  Upper Extremity Assessment: RUE deficits/detail RUE Deficits / Details: Brunnstrom stage 1 with no active movement in ROM. Edema noted thorughout forearm and hand.  RUE Sensation: decreased proprioception, decreased light touch RUE Coordination: decreased fine motor, decreased gross motor  Lower Extremity Assessment: Defer to PT evaluation RLE Deficits / Details: no noted AROM or sensation RLE Sensation: decreased proprioception, decreased light touch    ADLs  Overall ADL's : Needs assistance/impaired Eating/Feeding: Moderate assistance, Sitting Grooming: Sitting, Oral care, Wash/dry face, Maximal assistance(Supported sitting ) Grooming Details (indicate cue type and reason): Pt performed oral care with Max A. Provided hand over hand A to faciliate R hand grasp and bilateral coordination. Pt requiring support at R elbow to maintain position of RUE. Pt requiring Max cues to attend  to RUE and R visual field. Placed grooming items on R side of visual field or midline. Upper Body Bathing: Maximal assistance, Sitting, Bed level Lower Body Bathing: Maximal assistance, +2 for safety/equipment, Cueing for sequencing Lower Body Bathing Details (indicate cue type and reason): Max A for sitting balance at  EOB and perform peri care. Pt requiring Max cues to attend to R side and cross midline to pick up wash cloth with L hand and place back on right side. Providing tactile cues and facilitation of WBing through RUE on EOB during task. Upper Body Dressing : Maximal assistance, Sitting, Bed level Lower Body Dressing: Maximal assistance, Sit to/from stand, Bed level, +2 for physical assistance Toilet Transfer: (Simuated to recliner) Functional mobility during ADLs: Maximal assistance, +2 for physical assistance, Cueing for sequencing(SPT) General ADL Comments: Pt continues to demostrate R inattention and required Max A for attending to R side.     Mobility  Overal bed mobility: Needs Assistance Bed Mobility: Rolling, Sidelying to Sit Rolling: Mod assist, +2 for physical assistance Sidelying to sit: Max assist, +2 for physical assistance Supine to sit: Max assist, +2 for physical assistance General bed mobility comments: Pt using LUE to pull on bed rails. Required Max A to bring hips towards EOB with bed pad. Poor trunk support and reuqired Max A.     Transfers  Overall transfer level: Needs assistance Equipment used: None(2 person lift with gait belt and bed pad) Transfers: Sit to/from Stand, Stand Pivot Transfers Sit to Stand: Mod assist, +2 physical assistance Stand pivot transfers: Max assist, +2 physical assistance Squat pivot transfers: Mod assist General transfer comment: R knee blocked and max A +2 for maintaining standing balance. Max cues for weight shift.     Ambulation / Gait / Stairs / Wheelchair Mobility  Ambulation/Gait General Gait Details: unable      Posture / Balance Dynamic Sitting Balance Sitting balance - Comments: pt with R UE place in WBing position, min/modA to maintain R UE position. worked on finding and maintaining midline. Pt requires minA to maintain upright position with max cues to contract abdominal muscles and "pinch shoulders together" Pt requires mod/maxA majority of time, otherise pt falls posteriorly Balance Overall balance assessment: Needs assistance Sitting-balance support: Feet supported, Bilateral upper extremity supported Sitting balance-Leahy Scale: Poor Sitting balance - Comments: pt with R UE place in WBing position, min/modA to maintain R UE position. worked on finding and maintaining midline. Pt requires minA to maintain upright position with max cues to contract abdominal muscles and "pinch shoulders together" Pt requires mod/maxA majority of time, otherise pt falls posteriorly Postural control: Posterior lean Standing balance support: Bilateral upper extremity supported, During functional activity Standing balance-Leahy Scale: Poor Standing balance comment: mod +2 for balance in standing    Special needs/care consideration BiPAP/CPAP: CPAP at night that he wore intermittently since deviated septum surgery  CPM: No Continuous Drip IV: No Dialysis: No        Life Vest: No Oxygen: No Special Bed: No Trach Size: No Wound Vac (area): No       Skin: Left head surgical incision; Groin area with rash                                Bowel mgmt: Continent, last BM 08/05/17 Bladder mgmt: Incontinence but also continent at times with urinal close   Diabetic mgmt: No     Previous Home Environment Living Arrangements: Spouse/significant other, Children  Lives With: Spouse Available Help at Discharge: Family, Available 24 hours/day Type of Home: Apartment Home Layout: One level Home Access: Stairs to enter [employer] of Steps: 3rd floor apartment Bathroom Shower/Tub: Agricultural engineer: Standard Home Care Services: No  Discharge Living Setting Plans  for Discharge Living Setting: Patient's home, Lives with (comment)(Spouse, 73 year old son, daughter on the way) Type of Home at Discharge: Apartment(moving to a ground level by Feb. 1) Discharge Home Layout: One level Discharge Home Access: Level entry Discharge Bathroom Shower/Tub: Other (comment)(TBD) Discharge Bathroom Toilet: Handicapped height Discharge Bathroom Accessibility: Yes How Accessible: Accessible via wheelchair, Accessible via walker Does the patient have any problems obtaining your medications?: No  Social/Family/Support Systems Patient Roles: Spouse, Parent Contact Information: Spouse: Melanie Cohill Anticipated Caregiver: Mother, Misty Stanley will be here from [facility]. Anticipated Caregiver's Contact Information: Beverely Risen: [phone] Ability/Limitations of Caregiver: Spouse is due to deliver daughter in 2.5 months  Caregiver Availability: 24/7 Discharge Plan Discussed with Primary Caregiver: Yes Is Caregiver In Agreement with Plan?: Yes Does Caregiver/Family have Issues with Lodging/Transportation while Pt is in Rehab?: No  Goals/Additional Needs Patient/Family Goal for Rehab: PT/OT/SLP: Supervision-Min A Expected length of stay: 24-30 days  Cultural Considerations: Jehovah's Witness-no blood products Dietary Needs: Dys.1 textures and nectar-thick liquids  Equipment Needs: TBD Special Service Needs: will need a helmet Additional Information: Left crani Pt/Family Agrees to Admission and willing to participate: Yes Program Orientation Provided & Reviewed with Pt/Caregiver Including Roles  & Responsibilities: Yes  Decrease burden of Care through IP rehab admission: No  Possible need for SNF placement upon discharge: No  Patient Condition: This patient's medical and functional status has changed since the consult dated: 08/03/17 in which the Rehabilitation Physician determined and  documented that the patient's condition is appropriate for intensive rehabilitative care in an inpatient rehabilitation facility. See "History of Present Illness" (above) for medical update. Functional changes are: Mod-Max +2 assist with transfers. Patient's medical and functional status update has been discussed with the Rehabilitation physician and patient remains appropriate for inpatient rehabilitation. Will admit to inpatient rehab today.  Preadmission Screen Completed By:  Fae Pippin, 08/07/2017 1:45 PM ______________________________________________________________________   Discussed status with Dr. Riley Kill on 08/07/17 at 1350 and received telephone approval for admission today.  Admission Coordinator:  Fae Pippin, time 1350/Date 08/07/17             Cosigned by: Ranelle Oyster, MD at 08/07/2017 2:26 PM  Revision History

## 2017-08-08 NOTE — Evaluation (Signed)
Physical Therapy Assessment and Plan  Patient Details  Name: Jeremy [name] MRN: [id number] Date of Birth: Aug 17, 1975  PT Diagnosis: Abnormal posture, Abnormality of gait, Hemiplegia dominant, Hypotonia, Impaired cognition, Impaired sensation and Muscle weakness Rehab Potential: Fair ELOS: 3.5-4 weeks    Today's Date: 08/08/2017 PT Individual Time: 7253-6644 PT Individual Time Calculation (min): 76 min    Problem List:  Patient Active Problem List   Diagnosis Date Noted  . Left middle cerebral artery stroke ([place]) 08/07/2017  . Aphasia due to acute stroke ([place]) 08/07/2017  . B12 deficiency   . Acute respiratory failure ([place])   . Central line infiltration ([place])   . Endotracheal tube present   . Fever   . Leukocytosis   . S/P craniotomy 07/30/2017  . Cerebral edema ([facility]) 07/28/2017  . Hyperlipidemia 07/28/2017  . Stroke (cerebrum) ([place]) 07/27/2017    Past Medical History:  Past Medical History:  Diagnosis Date  . Anxiety   . Asthma    MILD AS A CHILD  . Depression   . Deviated septum   . Dyspnea    NORMAL SPIROMETRY 02/01/17 VISIT WITH DR [place]  . Hypertension   . Sleep apnea    Past Surgical History:  Past Surgical History:  Procedure Laterality Date  . CRANIOTOMY Left 07/29/2017   Procedure: LEFT HEMI- CRANIECTOMY WITH PLACEMENT OF BONE FLAP IN ABDOMEN;  Surgeon: Consuella Lose, MD;  Location: [facility];  Service: Neurosurgery;  Laterality: Left;  . IR ANGIO INTRA EXTRACRAN SEL COM CAROTID INNOMINATE UNI R MOD SED  07/28/2017  . IR ANGIO VERTEBRAL SEL VERTEBRAL UNI R MOD SED  07/28/2017  . IR CT HEAD LTD  07/28/2017  . IR PERCUTANEOUS ART THROMBECTOMY/INFUSION INTRACRANIAL INC DIAG ANGIO  07/28/2017  . NASAL SEPTOPLASTY W/ TURBINOPLASTY Bilateral 03/06/2017   Procedure: NASAL SEPTOPLASTY WITH TURBINATE REDUCTION;  Surgeon: Beverly Gust, MD;  Location: [facility];  Service: ENT;  Laterality: Bilateral;  . RADIOLOGY WITH ANESTHESIA N/A 07/27/2017   Procedure: RADIOLOGY WITH  ANESTHESIA;  Surgeon: Luanne Bras, MD;  Location: [place];  Service: Radiology;  Laterality: N/A;    Assessment & Plan Clinical Impression: Patient is a 42y.o.right handed malewith history of hypertension on no antihypertensive medications. Presented 07/27/2017 with right-sided weakness, headache,facial droop and slurred speech. Per chart review patient lives with spouse.They have a 2-year-old son as well as wife is currently pregnant.One level home/thirdthird floor apartmentand they are currently seeking an apartment ground-level.Good family support in the area.Independent and working prior to admission. Cranial CT scan showed left insula and frontal operculum lucency compatible with infarction.echocardiogram with ejection fraction of 65% no wall motion abnormalities. No defect or PFO identified. CT Angiography head and neck showed left M1 occlusion with intermediate left MCA distribution collateralization. Underwentattemptedmechanical thrombectomy per interventional radiologywhichwas unsuccessful and required intubation. Follow-up MRI showed cerebral edema with midline shift maintained on 3% saline. Underwent left decompressive hemicraniectomy placement of bone flap abdominal pocket 07/29/2017 per Dr.Nundkumar.Initially maintained onCleviprex.Patient was extubated 06/02/2018 NPOwith tube feeds for nutritional supportand diet advanced to a dysphagia #1 nectar thick liquid. Follow-up CT 08/03/2017 stable no new changes.Bilateral lower extremity Dopplers negative.TEE was attempted 08/06/2017 but unable to perform.No plan for loop recorder.Subcutaneous Lovenox was later initiated for DVT prophylaxis.    Patient transferred to CIR on 08/07/2017 .   Patient currently requires total with mobility secondary to muscle weakness and muscle paralysis, decreased cardiorespiratoy endurance, abnormal tone, unbalanced muscle activation, motor apraxia, decreased coordination and decreased  motor planning, decreased visual perceptual skills,  field cut and hemianopsia, decreased attention to right, right side neglect, decreased motor planning and ideational apraxia, decreased initiation, decreased attention, decreased awareness, decreased problem solving, decreased safety awareness, decreased memory and delayed processing and decreased sitting balance, decreased standing balance, decreased postural control, hemiplegia and decreased balance strategies.  Prior to hospitalization, patient was independent  with mobility and lived with Spouse, Son in a [place] home.  Home access is 3rd floor apartment; plans to move to first floor apartment on Feb. 1Stairs to enter.  Patient will benefit from skilled PT intervention to maximize safe functional mobility, minimize fall risk and decrease caregiver burden for planned discharge home with 24 hour assist.  Anticipate patient will benefit from follow up [facility] at discharge.  PT - End of Session Activity Tolerance: Tolerates < 10 min activity, no significant change in vital signs Endurance Deficit: Yes PT Assessment Rehab Potential (ACUTE/IP ONLY): Fair PT Barriers to Discharge: [place] home environment;Decreased caregiver support;Medical stability;Home environment access/layout;Insurance for SNF coverage;Nutrition means;Behavior PT Patient demonstrates impairments in the following area(s): Balance;Behavior;Edema;Endurance;Motor;Nutrition;Pain;Safety;Skin Integrity;Other (comment);Sensory;Perception PT Transfers Functional Problem(s): Bed Mobility;Bed to Chair;Car;Floor;Furniture PT Locomotion Functional Problem(s): Ambulation;Wheelchair Mobility;Stairs PT Plan PT Intensity: Minimum of 1-2 x/day ,45 to 90 minutes PT Frequency: 5 out of 7 days PT Duration Estimated Length of Stay: 3.5-4 weeks  PT Treatment/Interventions: Ambulation/gait training;Cognitive remediation/compensation;Community reintegration;Balance/vestibular training;Discharge  planning;Disease management/prevention;Functional electrical stimulation;DME/adaptive equipment instruction;Functional mobility training;Neuromuscular re-education;Pain management;Patient/family education;Skin care/wound management;Therapeutic Activities;Splinting/orthotics;Psychosocial support;Stair training;UE/LE Strength taining/ROM;UE/LE Coordination activities;Therapeutic Exercise;Visual/perceptual remediation/compensation;Wheelchair propulsion/positioning PT Transfers Anticipated Outcome(s): Min assist with LRAD  PT Locomotion Anticipated Outcome(s): Supervision WC mobility and mod assist ambulation.  PT Recommendation Recommendations for Other Services: Therapeutic Recreation consult;Neuropsych consult Therapeutic Recreation Interventions: Stress management Follow Up Recommendations: Home health PT Patient destination: Home Equipment Recommended: Wheelchair cushion (measurements);Wheelchair (measurements);Rolling walker with 5" wheels;Sliding board;To be determined  Skilled Therapeutic Intervention Pt received supine in bed and agreeable to PT. Supine>sit transfer with total assist.  PT instructed patient in PT Evaluation and initiated treatment intervention; see below for results. PT educated patient in [place], rehab potential, rehab goals, and discharge recommendations. PT fit pt for 20x18 TIS WC to improve posture, upright tolerance, reduce pressure on sacrum and. Patient returned to room and left sitting in [facility] with call bell in reach and all needs met.      PT Evaluation Precautions/Restrictions Precautions Precautions: Fall Precaution Comments: hemicraniectomy placement of bone flap abdominal pocket Required Braces or Orthoses: Other Brace/Splint Other Brace/Splint: Helmet when OOB Restrictions Weight Bearing Restrictions: No General   Vital SignsTherapy Vitals Temp: 98.8 F (37.1 C) Temp Source: Oral Pulse Rate: 98 Resp: 16 BP: 130/81 Patient Position (if appropriate):  Lying Oxygen Therapy SpO2: 95 % O2 Device: Not Delivered Pain Pain Assessment Faces Pain Scale: Hurts little more Pain Location: Other (Comment)(stomach ache) Pain Descriptors / Indicators: Aching Pain Intervention(s): Repositioned;Ambulation/increased activity Home Living/Prior Functioning Home Living Available Help at Discharge: Family;Available 24 hours/day Type of Home: Apartment Home Access: Stairs to enter [employer] of [place]: 3rd floor apartment; plans to move to first floor apartment on Feb. 1 Home Layout: One level Bathroom Shower/Tub: Chiropodist: Standard Additional Comments: Family reports they are moving into handicap accessible apartment on 2/1  Lives With: Spouse;Son Prior Function Level of Independence: Independent with basic ADLs;Independent with homemaking with ambulation;Independent with gait;Independent with transfers  Able to Take Stairs?: Yes Driving: Yes Vocation: Full time employment Comments: pt worked with [employer] and lives with wife and 77yo son Vision/Perception  Vision - Assessment  Alignment/Gaze Preference: Head turned Tracking/Visual Pursuits: Decreased smoothness of eye movement to LEFT inferior field;Impaired - to be further tested in functional context Saccades: Undershoots;Decreased speed of saccadic movement Additional Comments: Pt with difficulty following commnads for formal testing; will cont to assess in functional context Perception Perception: Impaired Inattention/Neglect: Does not attend to right side of body;Does not attend to right visual field Praxis Praxis: Impaired Praxis Impairment Details: Initiation;Motor planning  Cognition Overall Cognitive Status: Impaired/Different from baseline Arousal/Alertness: Awake/alert Attention: Sustained Focused Attention: Appears intact Sustained Attention: Impaired Sustained Attention Impairment: Verbal basic;Functional basic Selective Attention:  Impaired Selective Attention Impairment: Verbal basic;Functional basic Memory: Impaired Memory Impairment: Decreased recall of new information;Decreased short term memory Decreased Short Term Memory: Verbal basic;Functional basic Awareness: Impaired Awareness Impairment: Emergent impairment Problem Solving: Impaired Problem Solving Impairment: Verbal basic;Functional basic Executive Function: (All impaired due to lower level deficits) Reasoning: Impaired Reasoning Impairment: Verbal complex Behaviors: Impulsive Safety/Judgment: Impaired Comments: left gaze preference, R neglect, mildly impulsive Sensation Sensation Light Touch: Impaired Detail Light Touch Impaired Details: Absent RLE;Absent RUE Proprioception: Impaired by gross assessment;Impaired Detail Proprioception Impaired Details: Impaired RLE;Absent RUE Coordination Gross Motor Movements are Fluid and Coordinated: No Fine Motor Movements are Fluid and Coordinated: No Coordination and Movement Description: dense R hemiplegia  Finger Nose Finger Test: WFL L UE, unable to assess R UE due to flaccid hemiplegia Motor  Motor Motor: Hemiplegia;Abnormal tone Motor - Skilled Clinical Observations: Dense R Hemiplegia  Mobility Bed Mobility Bed Mobility: Rolling Right;Rolling Left;Supine to Sit;Sit to Supine Rolling Right: 1: +1 Total assist Rolling Left: 1: +1 Total assist Supine to Sit: 1: +1 Total assist Sit to Supine: 1: +1 Total assist Transfers Transfers: Yes Sit to Stand: 1: +1 Total assist Sit to Stand Details (indicate cue type and reason): PT required to block RLE  Stand Pivot Transfers: 1: +1 Total assist Stand Pivot Transfer Details (indicate cue type and reason): PT required to block RLE  Locomotion  Ambulation Ambulation: No Gait Gait: No Stairs / Additional Locomotion Stairs: No Wheelchair Mobility Wheelchair Mobility: Yes Wheelchair Assistance: 1: +1 Total assist Wheelchair Propulsion: Other  (comment) Distance: 191f in TIS WC   Trunk/Postural Assessment  Cervical Assessment Cervical Assessment: Exceptions to WFL(L gaze preference) Thoracic Assessment Thoracic Assessment: Exceptions to WFL(Rounded shoulders) Lumbar Assessment Lumbar Assessment: Exceptions to WFL(Posterior pelvic tilt) Postural Control Postural Control: Deficits on evaluation(Strong R lean, posterior preference, max cuing for midline orientation but unable to sustain)  Balance Balance Balance Assessed: No Dynamic Sitting Balance Sitting balance - Comments: +2 required for static and dynamic sitting balance EOB, able to follow VCs for weightshift, however, unable to maintain. No righting reactions Extremity Assessment  RUE Assessment RUE Assessment: Exceptions to WFL(flaccid; 0/5 throughout) LUE Assessment LUE Assessment: Within Functional Limits       See Function Navigator for Current Functional Status.   Refer to Care Plan for Long Term Goals  Recommendations for other services: Neuropsych and Therapeutic Recreation  Stress management  Discharge Criteria: Patient will be discharged from PT if patient refuses treatment 3 consecutive times without medical reason, if treatment goals not met, if there is a change in medical status, if patient makes no progress towards goals or if patient is discharged from hospital.  The above assessment, treatment plan, treatment alternatives and goals were discussed and mutually agreed upon: by patient  ALorie Phenix1/23/2019, 5:32 PM

## 2017-08-08 NOTE — Progress Notes (Signed)
Orthopedic Tech Progress Note Patient Details:  Jeremy GashJao [name] June 05, 1976 [id number]  Patient ID: Jeremy [name], male   DOB: June 05, 1976, 42 y.o.   MRN: [id number]   Jeremy DomCrawford, Jeremy [name] 08/08/2017, 11:19 AM Called in [facility] brace order; spoke with Morrie SheldonAshley

## 2017-08-08 NOTE — Evaluation (Signed)
Speech Language Pathology Assessment and Plan  Patient Details  Name: Jeremy [name] MRN: [id number] Date of Birth: Jul 20, 1975  SLP Diagnosis: Cognitive Impairments;Dysphagia  Rehab Potential: Good ELOS: 4 weeks    Today's Date: 08/08/2017 SLP Individual Time: 4098-1191 SLP Individual Time Calculation (min): 60 min   Problem List:  Patient Active Problem List   Diagnosis Date Noted  . Left middle cerebral artery stroke ([place]) 08/07/2017  . Aphasia due to acute stroke ([place]) 08/07/2017  . B12 deficiency   . Acute respiratory failure ([place])   . Central line infiltration ([place])   . Endotracheal tube present   . Fever   . Leukocytosis   . S/P craniotomy 07/30/2017  . Cerebral edema ([facility]) 07/28/2017  . Hyperlipidemia 07/28/2017  . Stroke (cerebrum) ([place]) 07/27/2017   Past Medical History:  Past Medical History:  Diagnosis Date  . Anxiety   . Asthma    MILD AS A CHILD  . Depression   . Deviated septum   . Dyspnea    NORMAL SPIROMETRY 02/01/17 VISIT WITH DR [place]  . Hypertension   . Sleep apnea    Past Surgical History:  Past Surgical History:  Procedure Laterality Date  . CRANIOTOMY Left 07/29/2017   Procedure: LEFT HEMI- CRANIECTOMY WITH PLACEMENT OF BONE FLAP IN ABDOMEN;  Surgeon: Consuella Lose, MD;  Location: [place];  Service: Neurosurgery;  Laterality: Left;  . IR ANGIO INTRA EXTRACRAN SEL COM CAROTID INNOMINATE UNI R MOD SED  07/28/2017  . IR ANGIO VERTEBRAL SEL VERTEBRAL UNI R MOD SED  07/28/2017  . IR CT HEAD LTD  07/28/2017  . IR PERCUTANEOUS ART THROMBECTOMY/INFUSION INTRACRANIAL INC DIAG ANGIO  07/28/2017  . NASAL SEPTOPLASTY W/ TURBINOPLASTY Bilateral 03/06/2017   Procedure: NASAL SEPTOPLASTY WITH TURBINATE REDUCTION;  Surgeon: Beverly Gust, MD;  Location: [facility];  Service: ENT;  Laterality: Bilateral;  . RADIOLOGY WITH ANESTHESIA N/A 07/27/2017   Procedure: RADIOLOGY WITH ANESTHESIA;  Surgeon: Luanne Bras, MD;  Location: [place];  Service: Radiology;   Laterality: N/A;    Assessment / Plan / Recommendation Clinical Impression Jeremy [name] a 42 y.o.right handed malewith history of hypertension on no antihypertensive medications. Presented 07/27/2017 with right-sided weakness, headache,facial droop and slurred speech. Per chart review patient lives with spouse.They have a 40-year-old son as well as wife is currently pregnant.One level home/thirdthird floor apartmentand they are currently seeking an apartment ground-level.Good family support in the area.Independent and working prior to admission. Cranial CT scan showed left insula and frontal operculum lucency compatible with infarction.echocardiogram with ejection fraction of 65% no wall motion abnormalities. No defect or PFO identified. CT Angiography head and neck showed left M1 occlusion with intermediate left MCA distribution collateralization. Underwentattemptedmechanical thrombectomy per interventional radiologywhichwas unsuccessful and required intubation. Follow-up MRI showed cerebral edema with midline shift maintained on 3% saline. Underwent left decompressive hemicraniectomy placement of bone flap abdominal pocket 07/29/2017 per Dr.Nundkumar.Initially maintained onCleviprex.Patient was extubated 08/02/2017, MBS on 08/03/17 with diet advancement to dysphagia 1 with nectar thick liquids. Follow-up CT 08/03/2017 stable no new changes.Bilateral lower extremity Dopplers negative.TEE was attempted 08/06/2017 but unable to perform.No plan for loop recorder.Subcutaneous Lovenox was later initiated for DVT prophylaxis. Physicaland occupationaltherapy evaluationscompletedwithrecommendations of physical medicine rehabilitation consult.  Patient was admitted for a comprehensive rehabilitation program on 08/07/17. Bedside Swallow Evaluation and Cognitive Linguistic Evaluation was completed on 08/08/17. Pt presents with moderate oropharyngeal dysphagia c/b moderate anterior right spillage  of nectar thick liquids and no lingual manipulation of puree bolus further complicated by decreased awareness  of impulsive rate. Pt is known silent aspirator on thin liquids per MBS on 1/18. Pt refused trials of dysphagia 2/3 on evaluation. Pt also presents with no verbal attempts to communicate. Instead he prefered to use gestures. However when prompted by SLP pt with functional expressive language abilities, was able to label and describe common objects as well as read at the word level. Pt doesn't appear to be aphasic.  Pt with good intelligibility at the simple phrase level (~75%) but respiratory support appears to decrease at the sentence level reducing speech intelligibility to <50%. Pt also presents with good orientation but impaired sustained attention, right inattention d/t field cut, decreased recall of information, basic problem solving, task initiation and intellectual awareness. Skilled ST is required to address the above mentioned deficits, increase functional independence and reduce caregiver burden. Anticipate that pt will require 24 hour supervision and follow up Outpatient ST services at discharge.   Skilled Therapeutic Interventions          Skilled treatment session focused on completion of bedside swallow evaluation and cognitive linguistic evaluations, see above. Pt required Max A cues to use verbal expression instead of gestures. When pt verbally expressed himself at simplistic language without any word finding deficits. Pt with anterior spillage of nectar thick liquids but no overt s/s of aspiration.    SLP Assessment  Patient will need skilled Speech Lanaguage Pathology Services during CIR admission    Recommendations  SLP Diet Recommendations: Dysphagia 2 (Fine chop);Nectar Liquid Administration via: Cup Medication Administration: Whole meds with puree Supervision: Patient able to self feed;Staff to assist with self feeding;Full supervision/cueing for compensatory  strategies Compensations: Minimize environmental distractions;Slow rate;Small sips/bites;Lingual sweep for clearance of pocketing Postural Changes and/or Swallow Maneuvers: Seated upright 90 degrees Oral Care Recommendations: Oral care BID Recommendations for Other Services: Neuropsych consult Patient destination: Home Follow up Recommendations: Home Health SLP;Outpatient SLP;24 hour supervision/assistance Equipment Recommended: To be determined    SLP Frequency 3 to 5 out of 7 days   SLP Duration  SLP Intensity  SLP Treatment/Interventions 4 weeks  Minumum of 1-2 x/day, 30 to 90 minutes  Cognitive remediation/compensation;Cueing hierarchy;Dysphagia/aspiration precaution training;Functional tasks;Patient/family education;Therapeutic Activities;Speech/Language facilitation;Multimodal communication approach    Pain Pain Assessment Pain Assessment: Faces Faces Pain Scale: Hurts a little bit Pain Location: Head Pain Orientation: Left Pain Descriptors / Indicators: Dull  Prior Functioning Cognitive/Linguistic Baseline: Within functional limits Type of Home: Apartment  Lives With: Spouse;Son Available Help at Discharge: Family;Available 24 hours/day Vocation: Full time employment  Function:  Eating Eating   Modified Consistency Diet: Yes Eating Assist Level: Helper feeds patient;Set up assist for   Eating Set Up Assist For: Opening containers       Cognition Comprehension Comprehension assist level: Understands basic 50 - 74% of the time/ requires cueing 25 - 49% of the time  Expression   Expression assist level: Expresses basis less than 25% of the time/requires cueing >75% of the time.;Expresses basic 25 - 49% of the time/requires cueing 50 - 75% of the time. Uses single words/gestures.  Social Interaction Social Interaction assist level: Interacts appropriately less than 25% of the time. May be withdrawn or combative.  Problem Solving Problem solving assist level:  Solves basic less than 25% of the time - needs direction nearly all the time or does not effectively solve problems and may need a restraint for safety  Memory Memory assist level: Recognizes or recalls less than 25% of the time/requires cueing greater than 75% of the time  Short Term Goals: Week 1: SLP Short Term Goal 1 (Week 1): Pt will consume trials of ice chips with overt s/s of aspiration to demonstrate readiness for repeat instrumental study.  SLP Short Term Goal 2 (Week 1): Pt will expand utterance to sentence level with Mod A cues.  SLP Short Term Goal 3 (Week 1): Pt will complete basic familiar tasks with Mod A cues.  SLP Short Term Goal 4 (Week 1): Pt will demonstrate task initiation with Mod A cues in 50% of opportunities.  SLP Short Term Goal 5 (Week 1): Pt will sustain attention to task for ~ 20 minutes with Mod A cues.  SLP Short Term Goal 6 (Week 1): Pt will demonstrate intellectual awarness by answering yes/no questions related to physical and cognitive deficits with 75% accuracy and Mod A cues.   Refer to Care Plan for Long Term Goals  Recommendations for other services: Neuropsych  Discharge Criteria: Patient will be discharged from SLP if patient refuses treatment 3 consecutive times without medical reason, if treatment goals not met, if there is a change in medical status, if patient makes no progress towards goals or if patient is discharged from hospital.  The above assessment, treatment plan, treatment alternatives and goals were discussed and mutually agreed upon: by patient  Rishith Siddoway Rutherford Nail 08/08/2017, 12:47 PM

## 2017-08-08 NOTE — Evaluation (Signed)
Occupational Therapy Assessment and Plan  Patient Details  Name: Jeremy [name] MRN: [id number] Date of Birth: Sep 05, 1975  OT Diagnosis: abnormal posture, acute pain, apraxia, ataxia, cognitive deficits, disturbance of vision, flaccid hemiplegia and hemiparesis, hemiplegia affecting non-dominant side and muscle weakness (generalized) Rehab Potential: Rehab Potential (ACUTE ONLY): Good ELOS: 4 weeks   Today's Date: 08/08/2017 OT Individual Time: 1300-1400 OT Individual Time Calculation (min): 60 min     Problem List:  Patient Active Problem List   Diagnosis Date Noted  . Left middle cerebral artery stroke ([place]) 08/07/2017  . Aphasia due to acute stroke ([place]) 08/07/2017  . B12 deficiency   . Acute respiratory failure ([place])   . Central line infiltration ([place])   . Endotracheal tube present   . Fever   . Leukocytosis   . S/P craniotomy 07/30/2017  . Cerebral edema ([facility]) 07/28/2017  . Hyperlipidemia 07/28/2017  . Stroke (cerebrum) ([place]) 07/27/2017    Past Medical History:  Past Medical History:  Diagnosis Date  . Anxiety   . Asthma    MILD AS A CHILD  . Depression   . Deviated septum   . Dyspnea    NORMAL SPIROMETRY 02/01/17 VISIT WITH DR Calverton  . Hypertension   . Sleep apnea    Past Surgical History:  Past Surgical History:  Procedure Laterality Date  . CRANIOTOMY Left 07/29/2017   Procedure: LEFT HEMI- CRANIECTOMY WITH PLACEMENT OF BONE FLAP IN ABDOMEN;  Surgeon: Consuella Lose, MD;  Location: [place];  Service: Neurosurgery;  Laterality: Left;  . IR ANGIO INTRA EXTRACRAN SEL COM CAROTID INNOMINATE UNI R MOD SED  07/28/2017  . IR ANGIO VERTEBRAL SEL VERTEBRAL UNI R MOD SED  07/28/2017  . IR CT HEAD LTD  07/28/2017  . IR PERCUTANEOUS ART THROMBECTOMY/INFUSION INTRACRANIAL INC DIAG ANGIO  07/28/2017  . NASAL SEPTOPLASTY W/ TURBINOPLASTY Bilateral 03/06/2017   Procedure: NASAL SEPTOPLASTY WITH TURBINATE REDUCTION;  Surgeon: Beverly Gust, MD;  Location: [facility];  Service:  ENT;  Laterality: Bilateral;  . RADIOLOGY WITH ANESTHESIA N/A 07/27/2017   Procedure: RADIOLOGY WITH ANESTHESIA;  Surgeon: Luanne Bras, MD;  Location: [place];  Service: Radiology;  Laterality: N/A;    Assessment & Plan Clinical Impression: Jeremy [name] a 42 y.o.right handed malewith history of hypertension on no antihypertensive medications. Presented 07/27/2017 with right-sided weakness, headache,facial droop and slurred speech. Per chart review patient lives with spouse.They have a 78-year-old son as well as wife is currently pregnant.One level home/thirdthird floor apartmentand they are currently seeking an apartment ground-level.Good family support in the area.Independent and working prior to admission. Cranial CT scan showed left insula and frontal operculum lucency compatible with infarction.echocardiogram with ejection fraction of 65% no wall motion abnormalities. No defect or PFO identified. CT Angiography head and neck showed left M1 occlusion with intermediate left MCA distribution collateralization. Underwentattemptedmechanical thrombectomy per interventional radiologywhichwas unsuccessful and required intubation. Follow-up MRI showed cerebral edema with midline shift maintained on 3% saline. Underwent left decompressive hemicraniectomy placement of bone flap abdominal pocket 07/29/2017 per Dr.Nundkumar.Initially maintained onCleviprex.Patient was extubated 06/02/2018 NPOwith tube feeds for nutritional supportand diet advanced to a dysphagia #1 nectar thick liquid. Follow-up CT 08/03/2017 stable no new changes.Bilateral lower extremity Dopplers negative.TEE was attempted 08/06/2017 but unable to perform.No plan for loop recorder.Subcutaneous Lovenox was later initiated for DVT prophylaxis. Physicaland occupationaltherapy evaluationscompletedwithrecommendations of physical medicine rehabilitation consult.Patient was admitted for a comprehensive rehabilitation  program    Patient transferred to CIR on 08/07/2017 .    Patient currently requires total  with basic self-care skills secondary to muscle weakness, decreased cardiorespiratoy endurance, abnormal tone, unbalanced muscle activation, motor apraxia, ataxia, decreased coordination and decreased motor planning, decreased visual acuity, decreased visual motor skills and hemianopsia, decreased midline orientation and decreased attention to right, decreased initiation, decreased attention, decreased awareness, decreased problem solving, decreased safety awareness, decreased memory and delayed processing and decreased sitting balance, decreased standing balance, decreased postural control, hemiplegia and decreased balance strategies.  Prior to hospitalization, patient could complete ADLs/IADLs with independent .  Patient will benefit from skilled intervention to decrease level of assist with basic self-care skills and increase independence with basic self-care skills prior to discharge home with care partner.  Anticipate patient will require minimal physical assistance and follow up home health.  OT - End of Session Activity Tolerance: Tolerates 10 - 20 min activity with multiple rests Endurance Deficit: Yes OT Assessment Rehab Potential (ACUTE ONLY): Good OT Barriers to Discharge: Decreased caregiver support;Insurance for SNF coverage OT Barriers to Discharge Comments: Wife is pregnant, due in two months. Plans for pt's mother to come be primary caregiver OT Patient demonstrates impairments in the following area(s): Balance;Pain;Behavior;Perception;Cognition;Safety;Sensory;Endurance;Motor;Vision;Nutrition OT Basic ADL's Functional Problem(s): Eating;Grooming;Bathing;Toileting;Dressing OT Transfers Functional Problem(s): Toilet;Tub/Shower OT Additional Impairment(s): Fuctional Use of Upper Extremity OT Plan OT Intensity: Minimum of 1-2 x/day, 45 to 90 minutes OT Frequency: 5 out of 7 days OT  Duration/Estimated Length of Stay: 4 weeks OT Treatment/Interventions: Balance/vestibular training;Discharge planning;Functional electrical stimulation;Pain management;Self Care/advanced ADL retraining;Therapeutic Activities;UE/LE Coordination activities;Cognitive remediation/compensation;Disease mangement/prevention;Functional mobility training;Patient/family education;Therapeutic Exercise;Visual/perceptual remediation/compensation;Community reintegration;DME/adaptive equipment instruction;Neuromuscular re-education;Psychosocial support;Splinting/orthotics;UE/LE Strength taining/ROM;Wheelchair propulsion/positioning OT Self Feeding Anticipated Outcome(s): Supervision/set-up OT Basic Self-Care Anticipated Outcome(s): Min A OT Toileting Anticipated Outcome(s): Min A OT Bathroom Transfers Anticipated Outcome(s): Min A OT Recommendation Recommendations for Other Services: Neuropsych consult;Therapeutic Recreation consult Therapeutic Recreation Interventions: Pet therapy;Stress management Patient destination: Home Follow Up Recommendations: Home health OT Equipment Recommended: To be determined   Skilled Therapeutic Intervention Pt seen for OT eval and ADL session. Pt in supine upon arrival, requiring encouragement to participate in tx session. He transferred to EOB with +2 assist. Required mod-max A for sitting balance EOB during bathing/dressing session. He was able to follow commands to weight shift to obtain midline sitting, however, unable to maintain. He bathed seated EOB, max cuing to attend to R UE, however, was able to cross midline to wash UE when cued.  She stood from EOB with +2 assist 3 musketeers style twice during session, total A to advance R LE when sidestepping towards EOB. Pt voiced need for BM at end of session. Transferred to supine to use bedpan as pt not safe to get onto [facility] due to impaired sitting balance. Pt left in supine at end of session on bedpan, NT made aware of pt's  position. Education provided to pt and his wife regarding role of OT, POC, OT/PT goals, and d/c planning.    OT Evaluation Precautions/Restrictions  Precautions Precautions: Fall Precaution Comments: hemicraniectomy placement of bone flap abdominal pocket Required Braces or Orthoses: Other Brace/Splint Other Brace/Splint: Helmet when OOB Restrictions Weight Bearing Restrictions: No General Chart Reviewed: Yes Pain Pain Assessment Faces Pain Scale: Hurts little more Pain Location: Other (Comment)(stomach ache) Pain Descriptors / Indicators: Aching Pain Intervention(s): Repositioned;Ambulation/increased activity Home Living/Prior Functioning Home Living Available Help at Discharge: Family, Available 24 hours/day Type of Home: Apartment Home Access: Stairs to enter [employer] of Steps: 3rd floor apartment; plans to move to first floor apartment on Feb. 1 Home Layout: One level Bathroom Shower/Tub:  Tub/shower Personal assistant: Standard Additional Comments: Family reports they are moving into handicap accessible apartment on 2/1  Lives With: Spouse, Son IADL History Homemaking Responsibilities: Yes Current License: Yes Mode of Transportation: Musician Occupation: Full time employment Type of Occupation: Land Prior Function Level of Independence: Independent with basic ADLs, Independent with homemaking with ambulation, Independent with gait, Independent with transfers  Able to Take Stairs?: Yes Driving: Yes Vocation: Full time employment Comments: pt worked with [employer] and lives with wife and 51yo son Vision Baseline Vision/History: Wears glasses(Contacts PTA) Wears Glasses: At all times Patient Visual Report: Blurring of vision;Peripheral vision impairment Vision Assessment?: Yes Alignment/Gaze Preference: Head turned Tracking/Visual Pursuits: Decreased smoothness of eye movement to LEFT inferior field;Impaired - to be further tested in functional  context Saccades: Undershoots;Decreased speed of saccadic movement Visual Fields: Right visual field deficit;Right homonymous hemianopsia;Impaired-to be further tested in functional context Additional Comments: Pt with difficulty following commnads for formal testing; will cont to assess in functional context Perception  Perception: Impaired Inattention/Neglect: Does not attend to right side of body;Does not attend to right visual field Praxis Praxis: Impaired Praxis Impairment Details: Initiation;Motor planning Cognition Overall Cognitive Status: Impaired/Different from baseline Arousal/Alertness: Awake/alert Orientation Level: Person Person: Oriented Year: 2019 Month: January Day of Week: Correct Memory: Impaired Memory Impairment: Decreased recall of new information;Decreased short term memory Decreased Short Term Memory: Verbal basic;Functional basic Immediate Memory Recall: Blue;Bed;Sock Memory Recall: Sock;Blue;Bed Memory Recall Sock: Without Cue Memory Recall Blue: Without Cue Memory Recall Bed: Without Cue Attention: Sustained Focused Attention: Appears intact Sustained Attention: Impaired Sustained Attention Impairment: Verbal basic;Functional basic Selective Attention: Impaired Selective Attention Impairment: Verbal basic;Functional basic Awareness: Impaired Awareness Impairment: Emergent impairment Problem Solving: Impaired Problem Solving Impairment: Verbal basic;Functional basic Executive Function: (All impaired due to lower level deficits) Reasoning: Impaired Reasoning Impairment: Verbal complex Behaviors: Impulsive Safety/Judgment: Impaired Comments: left gaze preference, R neglect, mildly impulsive Sensation Sensation Light Touch: Impaired Detail Light Touch Impaired Details: Absent RLE;Absent RUE Proprioception: Impaired by gross assessment;Impaired Detail Proprioception Impaired Details: Impaired RLE;Absent RUE Coordination Gross Motor Movements are  Fluid and Coordinated: No Fine Motor Movements are Fluid and Coordinated: No Coordination and Movement Description: dense R hemiplegia  Finger Nose Finger Test: WFL L UE, unable to assess R UE due to flaccid hemiplegia Motor  Motor Motor: Hemiplegia;Abnormal tone Motor - Skilled Clinical Observations: Dense R Hemiplegia Trunk/Postural Assessment  Cervical Assessment Cervical Assessment: Exceptions to WFL(L gaze preference) Thoracic Assessment Thoracic Assessment: Exceptions to WFL(Rounded shoulders) Lumbar Assessment Lumbar Assessment: Exceptions to WFL(Posterior pelvic tilt) Postural Control Postural Control: Deficits on evaluation(Strong R lean, posterior preference, max cuing for midline orientation but unable to sustain)  Balance Balance Balance Assessed: No Dynamic Sitting Balance Sitting balance - Comments: +2 required for static and dynamic sitting balance EOB, able to follow VCs for weightshift, however, unable to maintain. No righting reactions Extremity/Trunk Assessment RUE Assessment RUE Assessment: Exceptions to WFL(flaccid; 0/5 throughout) LUE Assessment LUE Assessment: Within Functional Limits   See Function Navigator for Current Functional Status.   Refer to Care Plan for Long Term Goals  Recommendations for other services: Neuropsych and Therapeutic Recreation  Pet therapy   Discharge Criteria: Patient will be discharged from OT if patient refuses treatment 3 consecutive times without medical reason, if treatment goals not met, if there is a change in medical status, if patient makes no progress towards goals or if patient is discharged from hospital.  The above assessment, treatment plan, treatment alternatives and goals were discussed  and mutually agreed upon: by patient  Aveion Nguyen L 08/08/2017, 3:25 PM
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
## 2017-08-09 ENCOUNTER — Inpatient Hospital Stay (HOSPITAL_COMMUNITY): Payer: BLUE CROSS/BLUE SHIELD | Admitting: Physical Therapy

## 2017-08-09 ENCOUNTER — Inpatient Hospital Stay (HOSPITAL_COMMUNITY): Payer: BLUE CROSS/BLUE SHIELD | Admitting: Occupational Therapy

## 2017-08-09 ENCOUNTER — Inpatient Hospital Stay (HOSPITAL_COMMUNITY): Payer: BLUE CROSS/BLUE SHIELD | Admitting: Speech Pathology

## 2017-08-09 LAB — ALPHA GALACTOSIDASE: Alpha galactosidase, serum: 34.4 nmol/hr/mg prt (ref 28.0–80.0)

## 2017-08-09 NOTE — IPOC Note (Signed)
Overall Plan of Care [facility]) Patient Details Name: Jeremy [name] MRN: [id number] DOB: March 08, 1976  Admitting Diagnosis: <principal problem not specified>  Hospital Problems: Active Problems:   Left middle cerebral artery stroke [facility])   Aphasia due to acute stroke [facility])     Functional Problem List: Nursing Motor, Other (comment), Bowel, Bladder(Difficulty wit communication r/t dysphasia.)  PT Balance, Behavior, Edema, Endurance, Motor, Nutrition, Pain, Safety, Skin Integrity, Other (comment), Sensory, Perception  OT Balance, Pain, Behavior, Perception, Cognition, Safety, Sensory, Endurance, Motor, Vision, Nutrition  SLP Cognition, Perception, Endurance  TR         Basic ADL's: OT Eating, Grooming, Bathing, Toileting, Dressing     Advanced  ADL's: OT       Transfers: PT Bed Mobility, Bed to Chair, Car, Floor, Occupational psychologist, Research scientist (life sciences): PT Ambulation, Psychologist, prison and probation services, Stairs     Additional Impairments: OT Fuctional Use of Upper Extremity  SLP Swallowing, Communication, Social Cognition   Social Interaction, Problem Solving, Memory, Attention, Awareness  TR      Anticipated Outcomes Item Anticipated Outcome  Self Feeding Supervision/set-up  Swallowing  Supervision   Basic self-care  Min A  Toileting  Min A   Bathroom Transfers Min A  Bowel/Bladder  Continent x 2. Elimination with moderate assist.  Transfers  Min assist with LRAD   Locomotion  Supervision WC mobility and mod assist ambulation.   Communication  Min A  Cognition  Min A  Pain  <3.  Safety/Judgment  No falls.    Therapy Plan: PT Intensity: Minimum of 1-2 x/day ,45 to 90 minutes PT Frequency: 5 out of 7 days PT Duration Estimated Length of Stay: 3.5-4 weeks  OT Intensity: Minimum of 1-2 x/day, 45 to 90 minutes OT Frequency: 5 out of 7 days OT Duration/Estimated Length of Stay: 4 weeks SLP Intensity: Minumum of 1-2 x/day, 30 to 90 minutes SLP Frequency: 3 to 5 out of  7 days SLP Duration/Estimated Length of Stay: 4 weeks    Team Interventions: Nursing Interventions Bowel Management, Bladder Management, Patient/Family Education, Pain Management, Discharge Planning, Dysphagia/Aspiration Precaution Training  PT interventions Ambulation/gait training, Cognitive remediation/compensation, Community reintegration, Warden/ranger, Discharge planning, Disease management/prevention, Functional electrical stimulation, DME/adaptive equipment instruction, Functional mobility training, Neuromuscular re-education, Pain management, Patient/family education, Skin care/wound management, Therapeutic Activities, Splinting/orthotics, Psychosocial support, Stair training, UE/LE Strength taining/ROM, UE/LE Coordination activities, Therapeutic Exercise, Visual/perceptual remediation/compensation, Wheelchair propulsion/positioning  OT Interventions Balance/vestibular training, Discharge planning, Functional electrical stimulation, Pain management, Self Care/advanced ADL retraining, Therapeutic Activities, UE/LE Coordination activities, Cognitive remediation/compensation, Disease mangement/prevention, Functional mobility training, Patient/family education, Therapeutic Exercise, Visual/perceptual remediation/compensation, Community reintegration, Fish farm manager, Neuromuscular re-education, Psychosocial support, Splinting/orthotics, UE/LE Strength taining/ROM, Wheelchair propulsion/positioning  SLP Interventions Cognitive remediation/compensation, Financial trader, Dysphagia/aspiration precaution training, Functional tasks, Patient/family education, Therapeutic Activities, Speech/Language facilitation, Multimodal communication approach  TR Interventions    SW/CM Interventions Discharge Planning, Psychosocial Support, Patient/Family Education   Barriers to Discharge MD  Medical stability and Home enviroment access/loayout  Nursing Other (comments) Jeremy [name]  6 months  pregnant.  PT Inaccessible home environment, Decreased caregiver support, Medical stability, Home environment access/layout, Insurance for SNF coverage, Nutrition means, Behavior    OT Decreased caregiver support, Insurance for SNF coverage Jeremy [name] is pregnant, due in two months. Plans for pt's mother to come be primary caregiver  SLP Decreased caregiver support, Inaccessible home environment    SW       Team Discharge Planning: Destination: PT-Home ,OT- Home , SLP-Home Projected Follow-up: PT-Home  health PT, OT-  Home health OT, SLP-Home Health SLP, Outpatient SLP, 24 hour supervision/assistance Projected Equipment Needs: PT-Wheelchair cushion (measurements), Wheelchair (measurements), Rolling walker with 5" wheels, Sliding board, To be determined, OT- To be determined, SLP-To be determined Equipment Details: PT- , OT-  Patient/family involved in discharge planning: PT- Patient,  OT-Patient, Family member/caregiver, SLP-Patient  MD ELOS: 23-28d Medical Rehab Prognosis:  Good Assessment:  42 y.o.right handed malewith history of hypertension on no antihypertensive medications. Presented 07/27/2017 with right-sided weakness, headache,facial droop and slurred speech. Per chart review patient lives with spouse.They have a 40-year-old son as well as Jeremy [name] is currently pregnant.One level home/thirdthird floor apartmentand they are currently seeking an apartment ground-level.Good family support in the area.Independent and working prior to admission. Cranial CT scan showed left insula and frontal operculum lucency compatible with infarction.echocardiogram with ejection fraction of 65% no wall motion abnormalities. No defect or PFO identified. CT Angiography head and neck showed left M1 occlusion with intermediate left MCA distribution collateralization. Underwentattemptedmechanical thrombectomy per interventional radiologywhichwas unsuccessful and required intubation. Follow-up MRI showed  cerebral edema with midline shift maintained on 3% saline. Underwent left decompressive hemicraniectomy placement of bone flap abdominal pocket 07/29/2017 per Dr.Nundkumar.Initially maintained onCleviprex.Patient was extubated 06/02/2018 NPOwith tube feeds for nutritional supportand diet advanced to a dysphagia #1 nectar thick liquid. Follow-up CT 08/03/2017 stable no new changes.Bilateral lower extremity Dopplers negative.TEE was attempted 08/06/2017 but unable to perform.No plan for loop recorder.   Now requiring 24/7 Rehab RN,MD, as well as CIR level PT, OT and SLP.  Treatment team will focus on ADLs and mobility with [facility]  See Team Conference Notes for weekly updates to the plan of care

## 2017-08-09 NOTE — Progress Notes (Signed)
Speech Language Pathology Daily Session Note  Patient Details  Name: Jeremy [name] MRN: [id number] Date of Birth: [birth year]  Today's Date: 08/09/2017 SLP Individual Time: 1030-1130 SLP Individual Time Calculation (min): 60 min  Short Term Goals: Week 1: SLP Short Term Goal 1 (Week 1): Pt will consume trials of ice chips with overt s/s of aspiration to demonstrate readiness for repeat instrumental study.  SLP Short Term Goal 2 (Week 1): Pt will expand utterance to sentence level with Mod A cues.  SLP Short Term Goal 3 (Week 1): Pt will complete basic familiar tasks with Mod A cues.  SLP Short Term Goal 4 (Week 1): Pt will demonstrate task initiation with Mod A cues in 50% of opportunities.  SLP Short Term Goal 5 (Week 1): Pt will sustain attention to task for ~ 20 minutes with Mod A cues.  SLP Short Term Goal 6 (Week 1): Pt will demonstrate intellectual awarness by answering yes/no questions related to physical and cognitive deficits with 75% accuracy and Mod A cues.   Skilled Therapeutic Interventions: Skilled treatment session focused on dysphagia and cognition goals. SLP received pt in bed and on bedpan. Pt stated that he had BM. Nursing in to help SLP with pericare. Pt with no BM. Pt required Min A cues for bed mobility to aid in performing pericare. SLP also facilitated session by providing skilled observation of pt consuming ice chips. Pt with 1 cough out of 15 spoonfuls of ice. Pt able to perform peg design task with Mod A cues for task initiation. When cued pt would shake head "no" however if SLP waited for pt to engage in task he would. Pt with each peg he shook his head no. Pt with increased verbalize this session when things were funny but not with intentional language. Pt's speech was intelligible at the phrase level. Pt was returned to room, left upright in wheelchair (reclined) with all needs within reach. Continue per current plan of care.      Function:  Eating Eating   Modified  Consistency Diet: No(Ice chips with SLP only) Eating Assist Level: Set up assist for   Eating Set Up Assist For: (holding cup of ice)       Cognition Comprehension Comprehension assist level: Understands basic 50 - 74% of the time/ requires cueing 25 - 49% of the time  Expression   Expression assist level: Expresses basic 50 - 74% of the time/requires cueing 25 - 49% of the time. Needs to repeat parts of sentences.  Social Interaction Social Interaction assist level: Interacts appropriately 25 - 49% of time - Needs frequent redirection.  Problem Solving Problem solving assist level: Solves basic 25 - 49% of the time - needs direction more than half the time to initiate, plan or complete simple activities  Memory Memory assist level: Recognizes or recalls 25 - 49% of the time/requires cueing 50 - 75% of the time    Pain Pain Assessment Pain Assessment: No/denies pain Pain Score: 0-No pain Faces Pain Scale: No hurt  Therapy/Group: Individual Therapy  Cordney Barstow 08/09/2017, 3:41 PM

## 2017-08-09 NOTE — Progress Notes (Signed)
Orthopedic Tech Progress Note Patient Details:  Jeremy [name] August 30, 1975 [id number] Brace completed by hanger. Patient ID: Jeremy [name], male   DOB: August 30, 1975, 42 y.o.   MRN: [id number]   Jeremy [name], Jeremy [name] 08/09/2017, 4:27 PM

## 2017-08-09 NOTE — Progress Notes (Signed)
Physical Therapy Session Note  Patient Details  Name: Jeremy [name] MRN: [id number] Date of Birth: [birth year]  Today's Date: 08/09/2017 PT Individual Time: 0900-1000 AND 1630-1700 PT Individual Time Calculation (min): 60 min   Short Term Goals: Week 1:  PT Short Term Goal 1 (Week 1): Pt will transfers consistently with max assist of 1.  PT Short Term Goal 2 (Week 1): Pt will maintain static sitting balance with mod assist x 2 min  PT Short Term Goal 3 (Week 1): Pt will initiate WC propulsion  PT Short Term Goal 4 (Week 1): Pt will perform bed mobility with max assist 50% of the time.  PT Short Term Goal 5 (Week 1): Pt will initiate gait training.   Skilled Therapeutic Interventions/Progress Updates:   Pt received supine in bed and agreeable to PT.  Lower body dressing with bridge technique for PT to pull pants to waist. Pt noted to have mild activaiton in the RLE   Supine>sit with total assist. Sitting balance EOB x 10 minutes max-total assist to [name]. SB transfer to the L with max-total assist +2.  SB transfer to the R with total assist +2 for safety.  Sitting balance EOM with max assist and visual feedback from mirror 5 minutes. Pt noted to have no righting reactions with LOB to the R mild pushers syndrom with LUE extended in sitting.   Pt returned to room and performed squat pivot transfer to bed with total assist. Sit>supine completed with +2 total assist, and left supine in bed with call bell in reach and all needs met.   Session 2.   PT applied lap tray to TIS WC to protect shoulder girdle and prevent increased subluxation of the R shoulder. PT treatment focused on task initiation and improved core control with dynavision. Program A 1 minute, 2 rings x 4 using LUE.  Pt scored 14, 14, 15 and 17 with max cues for visual scanning to the R. Mod assist to prevent LOB to the R with forward reach and  reach to the R.   Patient returned to room and left sitting in [name] with call bell  in reach and all needs met.         Therapy Documentation Precautions:  Precautions Precautions: Fall Precaution Comments: hemicraniectomy placement of bone flap abdominal pocket Required Braces or Orthoses: Other Brace/Splint Other Brace/Splint: Helmet when OOB Restrictions Weight Bearing Restrictions: No General:   Vital Signs:   Pain:   Mobility:   Locomotion :    Trunk/Postural Assessment :    Balance:   Exercises:   Other Treatments:     See Function Navigator for Current Functional Status.   Therapy/Group: Individual Therapy  [name] 08/09/2017, 10:07 AM

## 2017-08-09 NOTE — Care Management Note (Signed)
Inpatient Rehabilitation Center Individual Statement of Services  Patient Name:  Jeremy [name]  Date:  08/09/2017  Welcome to the Inpatient Rehabilitation Center.  Our goal is to provide you with an individualized program based on your diagnosis and situation, designed to meet your specific needs.  With this comprehensive rehabilitation program, you will be expected to participate in at least 3 hours of rehabilitation therapies Monday-Friday, with modified therapy programming on the weekends.  Your rehabilitation program will include the following services:  Physical Therapy (PT), Occupational Therapy (OT), Speech Therapy (ST), 24 hour per day rehabilitation nursing, Therapeutic Recreaction (TR), Neuropsychology, Case Management (Social Worker), Rehabilitation Medicine, Nutrition Services and Pharmacy Services  Weekly team conferences will be held on Wednesday to discuss your progress.  Your Social Worker will talk with you frequently to get your input and to update you on team discussions.  Team conferences with you and your family in attendance may also be held.  Expected length of stay: 4 weeks  Overall anticipated outcome: min-mod level  Depending on your progress and recovery, your program may change. Your Social Worker will coordinate services and will keep you informed of any changes. Your Social Worker's name and contact numbers are listed  below.  The following services may also be recommended but are not provided by the Inpatient Rehabilitation Center:   Driving Evaluations  Home Health Rehabiltiation Services  Outpatient Rehabilitation Services  Vocational Rehabilitation   Arrangements will be made to provide these services after discharge if needed.  Arrangements include referral to agencies that provide these services.  Your insurance has been verified to be:  BCBS Your primary doctor is:  Jerl MinaJames Hedrick  Pertinent information will be shared with your doctor and your insurance  company.  Social Worker:  Dossie DerBecky Loic Hobin, SW [phone] or ([phone]) [phone]  Information discussed with and copy given to patient by: Lucy Chrisupree, Zigmund Linse G, 08/09/2017, 9:11 AM

## 2017-08-09 NOTE — Progress Notes (Signed)
Social Work  Social Work Assessment and Plan  Patient Details  Name: Jeremy [name] MRN: [id number] Date of Birth: 1976-06-12  Today's Date: 08/09/2017  Problem List:  Patient Active Problem List   Diagnosis Date Noted  . 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 Aphasia due to acute stroke (HCC) 08/07/2017  . B12 deficiency   . Acute respiratory failure (HCC)   . Central line infiltration (HCC)   . Endotracheal tube present   . Fever   . Leukocytosis   . S/P craniotomy 07/30/2017  . Cerebral edema (HCC) 07/28/2017  . Hyperlipidemia 07/28/2017  . Stroke (cerebrum) (HCC) 07/27/2017   Past Medical History:  Past Medical History:  Diagnosis Date  . Anxiety   . Asthma    MILD AS A CHILD  . Depression   . Deviated septum   . Dyspnea    NORMAL SPIROMETRY 02/01/17 VISIT WITH DR HEDRICK  . Hypertension   . Sleep apnea    Past Surgical History:  Past Surgical History:  Procedure Laterality Date  . CRANIOTOMY Left 07/29/2017   Procedure: LEFT HEMI- CRANIECTOMY WITH PLACEMENT OF BONE FLAP IN ABDOMEN;  Surgeon: Lisbeth Renshaw, MD;  Location: [facility] OR;  Service: Neurosurgery;  Laterality: Left;  . IR ANGIO INTRA EXTRACRAN SEL COM CAROTID INNOMINATE UNI R MOD SED  07/28/2017  . IR ANGIO VERTEBRAL SEL VERTEBRAL UNI R MOD SED  07/28/2017  . IR CT HEAD LTD  07/28/2017  . IR PERCUTANEOUS ART THROMBECTOMY/INFUSION INTRACRANIAL INC DIAG ANGIO  07/28/2017  . NASAL SEPTOPLASTY W/ TURBINOPLASTY Bilateral 03/06/2017   Procedure: NASAL SEPTOPLASTY WITH TURBINATE REDUCTION;  Surgeon: Linus Salmons, MD;  Location: [facility] ORS;  Service: ENT;  Laterality: Bilateral;  . RADIOLOGY WITH ANESTHESIA N/A 07/27/2017   Procedure: RADIOLOGY WITH ANESTHESIA;  Surgeon: Julieanne Cotton, MD;  Location: [facility] OR;  Service: Radiology;  Laterality: N/A;   Social History:  reports that  has never smoked. he has never used smokeless tobacco. He reports that he drinks alcohol. He reports that he does not use  drugs.  Family / Support Systems Marital Status: Married Patient Roles: Spouse, Parent, Other (Comment)(employee) Spouse/Significant Other: [name] [phone] Children: 40 yo son Other Supports: Lisa-pt's mom coming back from [place] to assist with his care once discharged Anticipated Caregiver: Mom and wife Ability/Limitations of Caregiver: Wife is 7 months pregnant and will not be able to provide physical assist-Mom is coming from [facility] to assist Caregiver Availability: 24/7 Family Dynamics: Close knit family pt's Mom very supportive and involved along with wife's parents who are local. Pt and wife have good social supports who visit and will assist with childcare. Wife reports they will all pull together to get them through this  Social History Preferred language: Unknown Religion:  Cultural Background: No issues Education: Automotive engineer educated Read: Yes Write: Yes Employment Status: Employed Name of Employer: [employer] Return to Work Plans: Unsure depends upon his progress and how he recovers from this stroke Fish farm manager Issues: No issues Guardian/Conservator: None-according to MD pt is not fully capable of making his own decisions will look toward his wife if any decisions need to be made while here   Abuse/Neglect Abuse/Neglect Assessment Can Be Completed: Yes Physical Abuse: Denies Verbal Abuse: Denies Sexual Abuse: Denies Exploitation of patient/patient's resources: Denies Self-Neglect: Denies  Emotional Status Pt's affect, behavior adn adjustment status: Pt has speech deficits from his stroke, so information obtained form his wife. She reports he has alwasy been independent and provided for their  family. She is hopeful he will do well and recover from this stroke. They are still processing all that has happened to him since 1/11.  Recent Psychosocial Issues: other health issues were managed by PCP Pyschiatric History: History of anxiety/depression not  currently on medication but may benefit from due to high risk for depression and the severe stroke that has happened to him. Will refer him to neuro-psych when appropriate for coping and adjustment Substance Abuse History: No issues  Patient / Family Perceptions, Expectations & Goals Pt/Family understanding of illness & functional limitations: Wife can explain his stroke, she does talk with the MD and feels she has a good understanding of the treamtent plan and moving forward on rehab. She plans to be here daily to see him in therapies and provide emotional support. Pt can say he had a stroke, so may have a basic understanding. Premorbid pt/family roles/activities: Husband, father, employee, son, friend, colleague, etc Anticipated changes in roles/activities/participation: resume Pt/family expectations/goals: Wife states: " I hope he can do well here and get back as much function as he can. I would like him to be walking before he leaves here if he can."  [employer]Community Resources Community Agencies: None Premorbid Home Care/DME Agencies: None Transportation available at discharge: [employer] Resource referrals recommended: Neuropsychology, Support group (specify)  Discharge Planning Living Arrangements: Spouse/significant other, Children Support Systems: Spouse/significant other, Children, Parent, Other relatives, Friends/neighbors Type of Residence: Private residence Insurance Resources: Media plannerrivate Insurance (specify)(BCBS) Surveyor, quantityinancial Resources: Employment, Garment/textile technologistamily Support Financial Screen Referred: Yes Living Expenses: Psychologist, sport and exerciseent Money Management: Patient, Spouse Does the patient have any problems obtaining your medications?: No Home Management: Wife Patient/Family Preliminary Plans: Return home with wife and 89 yo son, [place] to assist with his phsyical care since wife will not be able too due to pregnancy and impending birth of their daughter. Wife's family is involved and willing to  assist also. Sw Barriers to Discharge: Inaccessible home environment, Insurance for SNF coverage Sw Barriers to Discharge Comments: Moving to first floor apartment this weekend-1/27. Does not have insurance for NHP Social Work Anticipated Follow Up Needs: HH/OP, Support Group  Clinical Impression Wife is encouraging to him and plans to be here daily to provide support and see his progress. Pt's [place] [facility] [place] provide the physical assist when discharged from rehab. Pt will need neuro-psych once appropriate with his predisposition to depression. Asked wife to talk with parent's to see if they can assist also with pt's care, this will be a group effort. Do not feel pt and wife fully realize the severity of the stroke pt has had and the care he will require for a months. Will provide support and work on a safe discharge plan. Made aware NHP is not an option due to his BCBS coverage.   Lucy Chrisupree, Elka Satterfield G 08/09/2017, 9:28 AM

## 2017-08-09 NOTE — Progress Notes (Signed)
Occupational Therapy Session Note  Patient Details  Name: Jeremy [name] MRN: [id number] Date of Birth: 1976-02-11  Today's Date: 08/09/2017 OT Individual Time: 1400-1500 OT Individual Time Calculation (min): 60 min    Short Term Goals: Week 1:  OT Short Term Goal 1 (Week 1): Pt will maintain sitting balance EOB with min A OT Short Term Goal 2 (Week 1): Pt will complete transfer with assist +1 in order to decrease caregiver burden OT Short Term Goal 3 (Week 1): Pt will attend to R UE during bathing/dressing task with min cuing OT Short Term Goal 4 (Week 1): Pt will recall hemi dressing technique with min questioning cues  Skilled Therapeutic Interventions/Progress Updates:    Pt seen for OT session focusing on functional transfers, midline orientation, sitting balance and ADL re-training. Pt in supine upon arrival with RN and wife present. Report pt voicing need for BM. He transferred to EOB with max A +1, able to assist in bringing trunk upright.  [facility] He was able to stand into STEDY with min A, however, required total-+2 assist for static sitting balance in STEDY due to strong R lean. He sat on toilet with max A overall for sitting balance with L UE supported. R UE placed in weightbearing position with max cuing for weight shift to midline. Pt able to assist in weight shift, however, unable to maintain and no righting reactions with R lean outside BOS.  Pt unable to void, total A +2 for clothing management and hygiene and for static standing balance in STEDY. Pt remained seated in STEDY and taken to mirror for visual feedback of midline orientation, he required the same amount of assist as noted above. Attempted to have pt reach for items in superior L field to facilitate weight shift and erect posture, however, unable to maintain.  Pt returned to tilt-in-space w/c and completed grooming tasks seated in w/c at sink with VCs for initiation. He was able to locate  washcloth placed on R side of sink and attended to R side of face. Pt left seated in tilt-in-space w/c at end of session, reclined back with QRB donned. Wife present and all needs in reach. Made RN aware to cont use of Maximove and bedpan at this time for nursing staff.   Therapy Documentation Precautions:  Precautions Precautions: Fall Precaution Comments: hemicraniectomy placement of bone flap abdominal pocket Required Braces or Orthoses: Other Brace/Splint Other Brace/Splint: Helmet when OOB Restrictions Weight Bearing Restrictions: No Pain: No/denies pain  See Function Navigator for Current Functional Status.   Therapy/Group: Individual Therapy  [name] L 08/09/2017, 7:07 AM
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
## 2017-08-10 ENCOUNTER — Inpatient Hospital Stay (HOSPITAL_COMMUNITY): Payer: BLUE CROSS/BLUE SHIELD | Admitting: Occupational Therapy

## 2017-08-10 ENCOUNTER — Inpatient Hospital Stay (HOSPITAL_COMMUNITY): Payer: BLUE CROSS/BLUE SHIELD | Admitting: Speech Pathology

## 2017-08-10 ENCOUNTER — Inpatient Hospital Stay (HOSPITAL_COMMUNITY): Payer: BLUE CROSS/BLUE SHIELD | Admitting: Physical Therapy

## 2017-08-10 LAB — URINALYSIS, ROUTINE W REFLEX MICROSCOPIC
BILIRUBIN URINE: NEGATIVE
GLUCOSE, UA: NEGATIVE mg/dL
HGB URINE DIPSTICK: NEGATIVE
Ketones, ur: NEGATIVE mg/dL
Leukocytes, UA: NEGATIVE
Nitrite: NEGATIVE
Protein, ur: NEGATIVE mg/dL
SPECIFIC GRAVITY, URINE: 1.018 (ref 1.005–1.030)
pH: 5 (ref 5.0–8.0)

## 2017-08-10 MED ORDER — SODIUM CHLORIDE 0.45 % IV SOLN
INTRAVENOUS | Status: DC
  Administered 2017-08-10 – 2017-08-15 (×5): via INTRAVENOUS
  Administered 2017-08-16 – 2017-08-17 (×2): 75 mL/h via INTRAVENOUS

## 2017-08-10 NOTE — Progress Notes (Signed)
Occupational Therapy Session Note  Patient Details  Name: Jeremy [name] MRN: [id number] Date of Birth: 08-12-1975  Today's Date: 08/10/2017 OT Individual Time: 1300-1400 OT Individual Time Calculation (min): 60 min    Short Term Goals: Week 1:  OT Short Term Goal 1 (Week 1): Pt will maintain sitting balance EOB with min A OT Short Term Goal 2 (Week 1): Pt will complete transfer with assist +1 in order to decrease caregiver burden OT Short Term Goal 3 (Week 1): Pt will attend to R UE during bathing/dressing task with min cuing OT Short Term Goal 4 (Week 1): Pt will recall hemi dressing technique with min questioning cues  Skilled Therapeutic Interventions/Progress Updates:    Pt seen for OT session focusing on functional transfers, sitting balance and weight shift. Pt in supine upon arrival, requiring encouragement for participation but willing to participate. He transferred to EOB with assist for management of R LE pt able to use L hand to assist with bringing R UE across body into sidelying position and pt able to assist with powering up into sitting with VCs for technique. Completed +2 sliding board transfer into w/c. Once pt able to reach L armrest he can assist with pulling self into w/c.  In therapy gym, completed sliding board to thread mat. Addressed static sitting balance EOM with multimodal feedback for midline orientation. Pt initially requiring max A for sitting balance, however, by end of session, progressed to bouts of ~5-10 seconds of static sitting with guarding assist. Completed reaching activity with pt required to reach across midline to obtain horseshoe on R and place on basketball rim on L to facilitate weight shift and body awareness. R UE placed into weightbearing position during task.  Throughout session, pt stating "no" to every cue/command from therapist however while actively doing request of therapist.  Pt returned to w/c via sliding board, once able to reach armrest and  with max A for steadying, pt able to assist with pulling hips into chair. Pt left seated reclined in tilt in space w/c at end of session, QRB donned and wife present.   Therapy Documentation Precautions:  Precautions Precautions: Fall Precaution Comments: hemicraniectomy placement of bone flap abdominal pocket Required Braces or Orthoses: Other Brace/Splint Other Brace/Splint: Helmet when OOB Restrictions Weight Bearing Restrictions: No Pain:   No/ denies pain   See Function Navigator for Current Functional Status.   Therapy/Group: Individual Therapy  Mehgan Santmyer L 08/10/2017, 7:00 AM

## 2017-08-10 NOTE — Progress Notes (Addendum)
Physical Therapy Session Note  Patient Details  Name: Jeremy [name] MRN: [id number] Date of Birth: [birth year]  Today's Date: 08/10/2017 PT Individual Time: [phone]   55 min   Short Term Goals: Week 1:  PT Short Term Goal 1 (Week 1): Pt will transfers consistently with max assist of 1.  PT Short Term Goal 2 (Week 1): Pt will maintain static sitting balance with mod assist x 2 min  PT Short Term Goal 3 (Week 1): Pt will initiate WC propulsion  PT Short Term Goal 4 (Week 1): Pt will perform bed mobility with max assist 50% of the time.  PT Short Term Goal 5 (Week 1): Pt will initiate gait training.   Skilled Therapeutic Interventions/Progress Updates:   Pt received sitting EOB and agreeable to PT with OT present finishing treatment. Stand pivot transfer to [facility] with max assist +2 for safety.   Sit<>stand at rail in ahll x 4 with mod assist to attain standing and max assist to prevent R lateral LOB. Visual feed back mirror to force visual scanning R and improve midline orientation.   Gait training at rail in hall max assist +2 x 23f with max cues for posture, forward gaze, weight shifting, and gait pattern.   Squat pivot to bed . +2 max assist for safety.  sit>supine. With total assist +2.   Supine NMR: bridges x 8, hip/knee flexion/extension x 30 BLE with AAROM on the L. PT noted trace flexion/extension at end of exercise. Cues for use of visual feedback due to sensory deficits. Rolling supine to L with max assist foding to mod assist with max cues for use RUE and RLE to initiate movement.   Supine>sit with max assist and max cues for sequencing and technique  Lateral scoot on EOB with max assist. squat pivot/lateral scoot to WC with max assist from PT. PT required to block the RLE throughout scooting and transfers with max cues for anterior weight shift and improve positioning of the LUE.   [facility] to [facility] and all needs met.          Therapy Documentation Precautions:  Precautions Precautions: Fall Precaution Comments: hemicraniectomy placement of bone flap abdominal pocket Required Braces or Orthoses: Other Brace/Splint Other Brace/Splint: Helmet when OOB Restrictions Weight Bearing Restrictions: No    Pain: Pain Assessment Pain Assessment: No/denies pain Pain Score: 0-No pain  See Function Navigator for Current Functional Status.   Therapy/Group: Individual Therapy  ALorie Phenix1/25/2019, 10:08 AM

## 2017-08-10 NOTE — Progress Notes (Signed)
Speech Language Pathology Daily Session Note  Patient Details  Name: Jeremy [name] MRN: [id number] Date of Birth: 03/27/1976  Today's Date: 08/10/2017 SLP Individual Time: 1100-1200 SLP Individual Time Calculation (min): 60 min  Short Term Goals: Week 1: SLP Short Term Goal 1 (Week 1): Pt will consume trials of ice chips with overt s/s of aspiration to demonstrate readiness for repeat instrumental study.  SLP Short Term Goal 2 (Week 1): Pt will expand utterance to sentence level with Mod A cues.  SLP Short Term Goal 3 (Week 1): Pt will complete basic familiar tasks with Mod A cues.  SLP Short Term Goal 4 (Week 1): Pt will demonstrate task initiation with Mod A cues in 50% of opportunities.  SLP Short Term Goal 5 (Week 1): Pt will sustain attention to task for ~ 20 minutes with Mod A cues.  SLP Short Term Goal 6 (Week 1): Pt will demonstrate intellectual awarness by answering yes/no questions related to physical and cognitive deficits with 75% accuracy and Mod A cues.   Skilled Therapeutic Interventions: Skilled treatment session focused on cognition goals. SLP facilitated session by providing supervision cues for task initiation and Min A cues to problem solve peg design task. Pt required Mod A to count money and to perform simple money management task. Pt required Max A cues to look to right.Pt frequently appeared to be carefree about deficits by statements - doesn't bother me etc. Education provided but pt didn't appear receptive. Thin liquids not attempted d/t medication management during session. Pt was returned to room, left upright in wheelchair with TV to his right. Pt stated he didn't care if he looked at TV or not. Continue per current plan of care.      Function:    Cognition Comprehension Comprehension assist level: Follows basic conversation/direction with no assist  Expression   Expression assist level: Expresses basic needs/ideas: With extra time/assistive device  Social  Interaction Social Interaction assist level: Interacts appropriately 75 - 89% of the time - Needs redirection for appropriate language or to initiate interaction.  Problem Solving Problem solving assist level: Solves basic 50 - 74% of the time/requires cueing 25 - 49% of the time  Memory Memory assist level: Recognizes or recalls 25 - 49% of the time/requires cueing 50 - 75% of the time    Pain Pain Assessment Pain Assessment: 0-10 Pain Score: 7  Pain Type: Surgical pain Pain Location: Head Pain Orientation: Left Pain Descriptors / Indicators: Headache Pain Frequency: Constant Pain Onset: On-going Patients Stated Pain Goal: 3 Pain Intervention(s): Medication (See eMAR)  Therapy/Group: Individual Therapy  Chace Klippel 08/10/2017, 12:18 PM

## 2017-08-10 NOTE — Progress Notes (Signed)
Occupational Therapy Session Note  Patient Details  Name: Jeremy [name] MRN: [id number] Date of Birth: [birth year]  Today's Date: 08/10/2017 OT Individual Time: 0830-0900 OT Individual Time Calculation (min): 30 min    Short Term Goals: Week 1:  OT Short Term Goal 1 (Week 1): Pt will maintain sitting balance EOB with min A OT Short Term Goal 2 (Week 1): Pt will complete transfer with assist +1 in order to decrease caregiver burden OT Short Term Goal 3 (Week 1): Pt will attend to R UE during bathing/dressing task with min cuing OT Short Term Goal 4 (Week 1): Pt will recall hemi dressing technique with min questioning cues  Skilled Therapeutic Interventions/Progress Updates:    Pt was alert today and more verbal, engaging in conversation with therapist about sports, hometowns, asking questions.  He would constantly shake his head no and say no to every suggestion of movement and therapy.  Educated pt that therapy team respects his wishes but will be pushing him within reason and will not proceed with therapy everytime he says no. Pt seemed to understand but would continually state no to everything.  Pt was not physically resisting any therapy and allowed therapist to mobilize him.   -mobilization of RUE with scapular glides -donning of shirt with max A -with bed flat, rolling L and R with pt using L hand to support R arm at elbow using L leg and core to initiate movement, rolled with min A to R mod to L. -pelvic tilts - pt able to do first 2 well and then lost his focus - sidelying on L to sit with mod- max A - static sitting with max A with cues for hand placement and to lift chest through sternum -visual tracking to the R (strong L gaze preference) Pt's PT arrived, assisted PT with transfer to w/c  Therapy Documentation Precautions:  Precautions Precautions: Fall Precaution Comments: hemicraniectomy placement of bone flap abdominal pocket Required Braces or Orthoses: Other  Brace/Splint Other Brace/Splint: Helmet when OOB Restrictions Weight Bearing Restrictions: No   Pain: Pain Assessment Pain Assessment: No/denies pain Pain Score: 0-No pain ADL:   See Function Navigator for Current Functional Status.   Therapy/Group: Individual Therapy  SAGUIER,JULIA 08/10/2017, 8:27 AM
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
## 2017-08-11 ENCOUNTER — Inpatient Hospital Stay (HOSPITAL_COMMUNITY): Payer: BLUE CROSS/BLUE SHIELD | Admitting: Physical Therapy

## 2017-08-11 ENCOUNTER — Inpatient Hospital Stay (HOSPITAL_COMMUNITY): Payer: BLUE CROSS/BLUE SHIELD | Admitting: Occupational Therapy

## 2017-08-11 ENCOUNTER — Other Ambulatory Visit: Payer: Self-pay

## 2017-08-11 DIAGNOSIS — I10 Essential (primary) hypertension: Secondary | ICD-10-CM

## 2017-08-11 NOTE — Progress Notes (Signed)
Jeremy [name] is a 42 y.o. male 1975-11-25 [id number]  Subjective: Continued headache behind left eye, unchanged in intensity or pattern over past days. Unrelieved with Tylenol and reports preference for Dilaudid. Tearful mentioning older brother.  Objective: Vital signs in last 24 hours: Temp:  [98.3 F (36.8 C)-98.4 F (36.9 C)] 98.4 F (36.9 C) (01/26 0208) Pulse Rate:  [75-81] 81 (01/26 0208) Resp:  [16-17] 16 (01/26 0208) BP: (121-146)/(68-91) 146/91 (01/26 0208) SpO2:  [97 %-98 %] 97 % (01/26 0208) Weight change:  Last BM Date: 08/09/17  Intake/Output from previous day: 01/25 0701 - 01/26 0700 In: 1200 [P.O.:1200] Out: 860 [Urine:860]  Physical Exam General: No apparent distress   Dense R HP but alert and interactive  Lungs: Normal effort. Lungs clear to auscultation, no crackles or wheezes. Cardiovascular: Regular rate and rhythm, no edema Neurological: No new neurological deficits, R HP unchanged   Lab Results: BMET    Component Value Date/Time   NA 139 08/08/2017 0747   K 4.1 08/08/2017 0747   CL 103 08/08/2017 0747   CO2 25 08/08/2017 0747   GLUCOSE 138 (H) 08/08/2017 0747   BUN 18 08/08/2017 0747   CREATININE 0.96 08/08/2017 0747   CALCIUM 8.8 (L) 08/08/2017 0747   GFRNONAA >60 08/08/2017 0747   GFRAA >60 08/08/2017 0747   CBC    Component Value Date/Time   WBC 12.3 (H) 08/08/2017 0747   RBC 3.93 (L) 08/08/2017 0747   HGB 11.7 (L) 08/08/2017 0747   HCT 35.4 (L) 08/08/2017 0747   PLT 527 (H) 08/08/2017 0747   MCV 90.1 08/08/2017 0747   MCH 29.8 08/08/2017 0747   MCHC 33.1 08/08/2017 0747   RDW 12.8 08/08/2017 0747   LYMPHSABS 2.3 08/08/2017 0747   MONOABS 0.8 08/08/2017 0747   EOSABS 0.2 08/08/2017 0747   BASOSABS 0.0 08/08/2017 0747   CBG's (last 3):  No results for input(s): GLUCAP in the last 72 hours. LFT's Lab Results  Component Value Date   ALT 139 (H) 08/08/2017   AST 63 (H) 08/08/2017   ALKPHOS 96 08/08/2017   BILITOT 1.1 08/08/2017     Studies/Results: No results found.  Medications:  I have reviewed the patient's current medications. Scheduled Medications: . aspirin  325 mg Per Tube Daily  . atorvastatin  40 mg Oral q1800  . enoxaparin (LOVENOX) injection  40 mg Subcutaneous Q24H  . ezetimibe  10 mg Oral Daily  . Gerhardt's butt cream   Topical BID  . pantoprazole  40 mg Oral Daily   PRN Medications: acetaminophen **OR** acetaminophen (TYLENOL) oral liquid 160 mg/5 mL **OR** acetaminophen, bisacodyl, ondansetron **OR** ondansetron (ZOFRAN) IV, sorbitol  Assessment/Plan: Active Problems:   Left middle cerebral artery stroke (HCC)   Aphasia due to acute stroke (HCC)   Length of stay, days: 4  1. L MCA CVA with dense R HP s/p unsuccessful TPA and mechanical thrombectomy. S/p decompressive hemicraniotomy with bone flap in abd pocket 07/29/17 - continue inpatient rehab therapies. HA related to same behind L eye - pain meds prn 2. hypertension - monitor BP and continue ongoing tx 3. Dysphagia due to CVA - Dysphagia #1 nectar liquid with ST folowing 4. Hyperlipidemia - continue zetia and statin   Valerie A. [name], MD 08/11/2017, 12:38 PM

## 2017-08-11 NOTE — Progress Notes (Signed)
Occupational Therapy Session Note  Patient Details  Name: Jeremy [name] MRN: [id number] Date of Birth: 11-29-1975  Today's Date: 08/11/2017 OT Individual Time: 1000-1100 OT Individual Time Calculation (min): 60 min    Short Term Goals: Week 1:  OT Short Term Goal 1 (Week 1): Pt will maintain sitting balance EOB with min A OT Short Term Goal 2 (Week 1): Pt will complete transfer with assist +1 in order to decrease caregiver burden OT Short Term Goal 3 (Week 1): Pt will attend to R UE during bathing/dressing task with min cuing OT Short Term Goal 4 (Week 1): Pt will recall hemi dressing technique with min questioning cues  Skilled Therapeutic Interventions/Progress Updates:    Pt received in w/c and taken to therapy gym. In gym pt used slide board with +2 A for support with mod A to his L side. On mat, therapy focused on trunk/ postural control.  Pt sits in a kyphotic posture with a posterior pelvic tilt with a wt shift on R hip.  A pillow placed under R hip for pelvic alignment, L arm resting on tray table, R hand in wt bearing position with therapist facilitating trunk extension with place and holds with a mirror in front of pt for feedback.  Rehab tech assisted with placing items to upper L side for pt to work on active trunk extension.  R side visual tracking with finding playing cards on R side using his L hand to guide his tracking.  Pt has a strong R neglect and visual field cut, but could find items when his hand functioned as his guide with cues.  Pt transferred back to w/c to R side with max A of 2 for safety.   Returned to room to brush teeth and wash face.  Transferred back to bed with board and positioned in bed with arm on pillow for support.  Pt's bed alarm set and pt with all needs met.   Therapy Documentation Precautions:  Precautions Precautions: Fall Precaution Comments: hemicraniectomy placement of bone flap abdominal pocket Required Braces or Orthoses: Other Brace/Splint Other  Brace/Splint: Helmet when OOB Restrictions Weight Bearing Restrictions: No  Pain: no c/o pain   ADL:      See Function Navigator for Current Functional Status.   Therapy/Group: Individual Therapy  Billey Wojciak 08/11/2017, 12:51 PM

## 2017-08-11 NOTE — Progress Notes (Addendum)
Physical Therapy Session Note  Patient Details  Name: Cletus GashJao Dipierro MRN: [id number] Date of Birth: [birth year]  Today's Date: 08/11/2017 PT Individual Time: 0800-0900 PT Individual Time Calculation (min): 60 min   Short Term Goals: Week 1:  PT Short Term Goal 1 (Week 1): Pt will transfers consistently with max assist of 1.  PT Short Term Goal 2 (Week 1): Pt will maintain static sitting balance with mod assist x 2 min  PT Short Term Goal 3 (Week 1): Pt will initiate WC propulsion  PT Short Term Goal 4 (Week 1): Pt will perform bed mobility with max assist 50% of the time.  PT Short Term Goal 5 (Week 1): Pt will initiate gait training.   Skilled Therapeutic Interventions/Progress Updates:   Pt received supine in bed and agreeable to PT. Pt reports need for BM. Supine>sit transfer with total assist through log roll to R with mod assist.   Sitting balance EOB with max assist, mild pushing tendenices noted. Lateral scoot on EOB with max assist. Lateral scoot to drop arm BSC. Sitting balacne on BSC with max assist x 15 minutes. Mild improvement in balance without UE support on rail. Pt noted to have continent bowel and bladder movement. RN aware.   [facility] for clothing management and peri care with max assist from PT max-total assist to maintain standing balance for peri care.   Lateral scoot to TIS WC with max assist from PT. Max cues from PT for sequencing and posture.   kinetron hip/knee flexion/extension. 2 x 1 minute with facilitation of reciprocal movement from PT. No active RLE movement noted on this day.    Session 2.  Pt received supine in bed with family in room. At 1415 pt reports that he is too tired for PT and did note receive a lunch tray. PT educated pt in need for increased mobility and OOB, but pt continued to decline. Lunch tray arrived as PT providing education for increased mobility. Pt left sitting up up bed with NT present to eat lunch.       Therapy  Documentation Precautions:  Precautions Precautions: Fall Precaution Comments: hemicraniectomy placement of bone flap abdominal pocket Required Braces or Orthoses: Other Brace/Splint Other Brace/Splint: Helmet when OOB Restrictions Weight Bearing Restrictions: No Pain: 0/10 General: Missed time: 30 min: refused: eating.   See Function Navigator for Current Functional Status.   Therapy/Group: Individual Therapy  Golden Popustin E Azaan Leask 08/11/2017, 9:06 AM

## 2017-08-12 ENCOUNTER — Inpatient Hospital Stay (HOSPITAL_COMMUNITY): Payer: BLUE CROSS/BLUE SHIELD | Admitting: Occupational Therapy

## 2017-08-12 ENCOUNTER — Other Ambulatory Visit: Payer: Self-pay

## 2017-08-12 DIAGNOSIS — E782 Mixed hyperlipidemia: Secondary | ICD-10-CM

## 2017-08-12 LAB — URINE CULTURE: Culture: 80000 — AB

## 2017-08-12 MED ORDER — BISACODYL 10 MG RE SUPP
10.0000 mg | Freq: Every day | RECTAL | Status: DC
  Administered 2017-08-14 – 2017-09-02 (×8): 10 mg via RECTAL
  Filled 2017-08-12 (×20): qty 1

## 2017-08-12 NOTE — Progress Notes (Signed)
Jeremy [name] is a 42 y.o. male 18-Apr-1976 [id number]  Subjective: Sleeping initially at my visit, but easily awoken.  Denies new problems.   Objective: Vital signs in last 24 hours: Temp:  [98.1 F (36.7 C)-98.3 F (36.8 C)] 98.1 F (36.7 C) (01/27 0335) Pulse Rate:  [73-78] 73 (01/27 0335) Resp:  [17-18] 17 (01/27 0335) BP: (128-135)/(89-91) 135/91 (01/27 0335) SpO2:  [97 %-100 %] 97 % (01/27 0335) Weight change:  Last BM Date: 08/09/17  Intake/Output from previous day: 01/26 0701 - 01/27 0700 In: 120 [P.O.:120] Out: 1275 [Urine:1275]  Physical Exam General: No apparent distress   dense right hemiparesis unchanged. Lungs: Normal effort. Lungs clear to auscultation, no crackles or wheezes. Cardiovascular: Regular rate and rhythm, no edema Neurological: No new neurological deficits   Lab Results: BMET    Component Value Date/Time   NA 139 08/08/2017 0747   K 4.1 08/08/2017 0747   CL 103 08/08/2017 0747   CO2 25 08/08/2017 0747   GLUCOSE 138 (H) 08/08/2017 0747   BUN 18 08/08/2017 0747   CREATININE 0.96 08/08/2017 0747   CALCIUM 8.8 (L) 08/08/2017 0747   GFRNONAA >60 08/08/2017 0747   GFRAA >60 08/08/2017 0747   CBC    Component Value Date/Time   WBC 12.3 (H) 08/08/2017 0747   RBC 3.93 (L) 08/08/2017 0747   HGB 11.7 (L) 08/08/2017 0747   HCT 35.4 (L) 08/08/2017 0747   PLT 527 (H) 08/08/2017 0747   MCV 90.1 08/08/2017 0747   MCH 29.8 08/08/2017 0747   MCHC 33.1 08/08/2017 0747   RDW 12.8 08/08/2017 0747   LYMPHSABS 2.3 08/08/2017 0747   MONOABS 0.8 08/08/2017 0747   EOSABS 0.2 08/08/2017 0747   BASOSABS 0.0 08/08/2017 0747   CBG's (last 3):  No results for input(s): GLUCAP in the last 72 hours. LFT's Lab Results  Component Value Date   ALT 139 (H) 08/08/2017   AST 63 (H) 08/08/2017   ALKPHOS 96 08/08/2017   BILITOT 1.1 08/08/2017    Studies/Results: No results found.  Medications:  I have reviewed the patient's current medications. Scheduled  Medications: . aspirin  325 mg Per Tube Daily  . atorvastatin  40 mg Oral q1800  . enoxaparin (LOVENOX) injection  40 mg Subcutaneous Q24H  . ezetimibe  10 mg Oral Daily  . Gerhardt's butt cream   Topical BID  . pantoprazole  40 mg Oral Daily   PRN Medications: acetaminophen **OR** acetaminophen (TYLENOL) oral liquid 160 mg/5 mL **OR** acetaminophen, bisacodyl, ondansetron **OR** ondansetron (ZOFRAN) IV, sorbitol  Assessment/Plan: Active Problems:   Left middle cerebral artery stroke (HCC)   Aphasia due to acute stroke (HCC)   1.  Left MCA stroke with dense right hemiparesis status post unsuccessful TPA and mechanical thrombectomy.  Decompressive hemicraniotomy with bone flap abdominal pocket performed January 13.  We will continue inpatient CIR therapies as ongoing.  Also continue medical therapy as ongoing for risk factor management 2.  Hypertension.  Continue ongoing therapy and monitor blood pressure 3.  Dysphagia due to stroke.  Speech therapy following 4.  Hyperlipidemia.  Continue Zetia and statin.  Length of stay, days: 5   Jeremy Pattison A. [name], MD 08/12/2017, 8:21 AM

## 2017-08-12 NOTE — Progress Notes (Signed)
Occupational Therapy Session Note  Patient Details  Name: Jeremy [name] MRN: [id number] Date of Birth: [birth year]  Today's Date: 08/12/2017 OT Individual Time: 1100-1200 OT Individual Time Calculation (min): 60 min    Short Term Goals: Week 1:  OT Short Term Goal 1 (Week 1): Pt will maintain sitting balance EOB with min A OT Short Term Goal 2 (Week 1): Pt will complete transfer with assist +1 in order to decrease caregiver burden OT Short Term Goal 3 (Week 1): Pt will attend to R UE during bathing/dressing task with min cuing OT Short Term Goal 4 (Week 1): Pt will recall hemi dressing technique with min questioning cues  Skilled Therapeutic Interventions/Progress Updates:    Pt seen for OT session focusing on ADL re-training, functional mobility, and sitting balance. Pt in supine upon arrival, multiple friends and family present. Pt initially refusing, however, encouragement from therapist and family pt willing. Pants donned total A in supine, max A rolling with +2 to pull pants up. He transferred to EOB with mod A. Pt able to maintain static sitting balance with overall mod, occasional min A today, much improvement from previous sessions.  +2 sliding board transfer completed to w/c, pt able to assist when transferring to L side, however, cont to require +2 for safety to stabilize equipment and assist with control into chair.  He donned shirt seated in chair requiring max cuing for attention to R UE and sequencing of task.  In therapy gym, he transferred onto EOM in same manner as described above. Addressed static sitting EOM, mirror placed for visual feedback. Max cuing for weight shift to achieve midline sitting, however, pt unable to maintain due to R and posterior preference. Pt's R UE placed on physioball for support and weightbearing. Completed functional reaching task to promote trunk activation and elongation. Supported rest breaks provided with multimodal cuing for upright posture and  stretch of pectorals. Pt returned to w/c at end of session. Returned to room and encouraged to stay up in w/c to eat lunch. Pt positioned with R UE supported on lap tray and QRB donned. Throughout session, pt would state "no" or "I'm done", however would complete all requests of therapists. He cont to show very poor insight into his severe deficits. Education provided to pt's mother who attended tx session regarding impacts of stroke on attitude and referred to neuropsychologist to assist with further questions.   Therapy Documentation Precautions:  Precautions Precautions: Fall Precaution Comments: hemicraniectomy placement of bone flap abdominal pocket Required Braces or Orthoses: Other Brace/Splint Other Brace/Splint: Helmet when OOB Restrictions Weight Bearing Restrictions: No Pain:   No/ denies pain  See Function Navigator for Current Functional Status.   Therapy/Group: Individual Therapy  [name] L 08/12/2017, 6:50 AM

## 2017-08-12 NOTE — Progress Notes (Signed)
Order for suppository. Started Bowel Program. Suppository with digital stimulation. Very Large results. Now on schedule.

## 2017-08-12 NOTE — Progress Notes (Signed)
Occupational Therapy Session Note  Patient Details  Name: Jeremy GashJao [name] MRN: [id number] Date of Birth: [birth year]  Today's Date: 08/11/2017  OT Individual Time: 1240-1325 Total minutes=45  Short Term Goals: Week 1:  OT Short Term Goal 1 (Week 1): Pt will maintain sitting balance EOB with min A OT Short Term Goal 2 (Week 1): Pt will complete transfer with assist +1 in order to decrease caregiver burden OT Short Term Goal 3 (Week 1): Pt will attend to R UE during bathing/dressing task with min cuing OT Short Term Goal 4 (Week 1): Pt will recall hemi dressing technique with min questioning cues  Skilled Therapeutic Interventions/Progress Updates: Patient lying in bed and fatigued but concurred to work in bed as follows:  Bed mobility with focus on core, hip and leg strengthening and proprioception with max to total A side rolling to his left stronger side (right side hemiparesis) and with min assist rolling to his right hemi side  Tolerated scalpular mobilization and PNF passive ROM with his right affected UE  Bed positioning as patient had strong lean to his left when head of bed elevated  Mr. Jeremy CardinalSham's 40 year old son, 40 year old nephew, mother and older brother were present for the therapy session     Therapy Documentation Precautions:  Precautions Precautions: Fall Precaution Comments: hemicraniectomy placement of bone flap abdominal pocket Required Braces or Orthoses: Other Brace/Splint Other Brace/Splint: Helmet when OOB Restrictions Weight Bearing Restrictions: No  See Function Navigator for Current Functional Status.   Therapy/Group: Individual Therapy  [name], Jeremy Schwieger [name] 08/12/2017, 2:21 PM

## 2017-08-13 ENCOUNTER — Inpatient Hospital Stay (HOSPITAL_COMMUNITY): Payer: BLUE CROSS/BLUE SHIELD | Admitting: Occupational Therapy

## 2017-08-13 ENCOUNTER — Inpatient Hospital Stay (HOSPITAL_COMMUNITY): Payer: BLUE CROSS/BLUE SHIELD

## 2017-08-13 ENCOUNTER — Inpatient Hospital Stay (HOSPITAL_COMMUNITY): Payer: BLUE CROSS/BLUE SHIELD | Admitting: Speech Pathology

## 2017-08-13 NOTE — Progress Notes (Signed)
Physical Therapy Session Note  Patient Details  Name: Jeremy [name] MRN: [id number] Date of Birth: [birth year]  Today's Date: 08/13/2017 PT Individual Time: 0900-1000 PT Individual Time Calculation (min): 60 min   Short Term Goals: Week 1:  PT Short Term Goal 1 (Week 1): Pt will transfers consistently with max assist of 1.  PT Short Term Goal 2 (Week 1): Pt will maintain static sitting balance with mod assist x 2 min  PT Short Term Goal 3 (Week 1): Pt will initiate WC propulsion  PT Short Term Goal 4 (Week 1): Pt will perform bed mobility with max assist 50% of the time.  PT Short Term Goal 5 (Week 1): Pt will initiate gait training.   Skilled Therapeutic Interventions/Progress Updates: Pt presented in bed requesting to use toilet. Pt initially refusing OOB and only wanting bedpan, [facility]. Pt performed supine to sit with maxA for RLE placement and truncal support with max verbal cues for wt shifting to facilitation transition. Performed lateral scoot to BSC maxA x 1 with max cues for L hand placement. Performed [facility] to [facility] to allow rehab tech to remove brief. Pt able to maintain sitting balance with mod/maxA with PTA providing verbal cues for use of abdominals to correct posterior and R lateral lean. Pt requires continued reinforcement for correction as becomes easily distracted. Pt able to use washcloth with RUE to clean groin and performed [facility] max[facility] to allow RT to perform peri-care. Pt able to perform x 2 sit/stand max/totalA for placing brief, and switching BSC for TIS chair. Once in chair pt transported to rehab gym and performed reaching activities with horseshoes with LUE. Pt required max multimodal cues to return to midline and maintaining neutral. Pt returned to room at end of session and remained in TIS with call bell within reach and QRB placed.       Therapy Documentation Precautions:  Precautions Precautions: Fall Precaution Comments:  hemicraniectomy placement of bone flap abdominal pocket Required Braces or Orthoses: Other Brace/Splint Other Brace/Splint: Helmet when OOB Restrictions Weight Bearing Restrictions: No   See Function Navigator for Current Functional Status.   Therapy/Group: Individual Therapy  Adonai Helzer  Chinara Hertzberg, PTA  08/13/2017, 12:34 PM

## 2017-08-13 NOTE — Plan of Care (Signed)
  Consults RH STROKE PATIENT EDUCATION Description See Patient Education module for education specifics  08/13/2017 1234 - Progressing by Thurston HoleBennett, Josean Lycan, RN   RH SKIN INTEGRITY RH STG MAINTAIN SKIN INTEGRITY WITH ASSISTANCE Description STG Maintain Skin Integrity With max Assistance.  08/13/2017 1234 - Progressing by Thurston HoleBennett, Lucious Zou, RN   RH SAFETY RH STG ADHERE TO SAFETY PRECAUTIONS W/ASSISTANCE/DEVICE Description STG Adhere to Safety Precautions With max Assistance and appropriate assistive Device.  08/13/2017 1234 - Progressing by Thurston HoleBennett, Keelyn Fjelstad, RN

## 2017-08-13 NOTE — Progress Notes (Signed)
Speech Language Pathology Daily Session Note  Patient Details  Name: Jeremy [name] MRN: [id number] Date of Birth: March 11, 1976  Today's Date: 08/13/2017 SLP Individual Time: 0815-0900 SLP Individual Time Calculation (min): 45 min  Short Term Goals: Week 1: SLP Short Term Goal 1 (Week 1): Pt will consume trials of ice chips with overt s/s of aspiration to demonstrate readiness for repeat instrumental study.  SLP Short Term Goal 2 (Week 1): Pt will expand utterance to sentence level with Mod A cues.  SLP Short Term Goal 3 (Week 1): Pt will complete basic familiar tasks with Mod A cues.  SLP Short Term Goal 4 (Week 1): Pt will demonstrate task initiation with Mod A cues in 50% of opportunities.  SLP Short Term Goal 5 (Week 1): Pt will sustain attention to task for ~ 20 minutes with Mod A cues.  SLP Short Term Goal 6 (Week 1): Pt will demonstrate intellectual awarness by answering yes/no questions related to physical and cognitive deficits with 75% accuracy and Mod A cues.   Skilled Therapeutic Interventions: Skilled treatment session focused on dysphagia and cognition goals. SLP facilitated session by providing skilled observation of pt consuming ice chips. Pt with no overt s/s of aspiration. Pt argumentative and freely states that the ice chips are going into his lungs (more irationale thought process than intellectual awareness of aspiration). When attempting to engage pt in more activities he appeared to perform them incorrectly as evidenced by smiling and joking about his performance. Education provided on attempts to perform activities to demonstrate progress. Pt smiled and shook head no. Pt was left upright in bed with all needs within reach. Continue per current plan of care.      Function:  Eating Eating   Modified Consistency Diet: No(Ice chips with SLP)             Cognition Comprehension Comprehension assist level: Follows basic conversation/direction with no assist  Expression    Expression assist level: Expresses basic 90% of the time/requires cueing < 10% of the time.;Expresses basic 75 - 89% of the time/requires cueing 10 - 24% of the time. Needs helper to occlude trach/needs to repeat words.  Social Interaction Social Interaction assist level: Interacts appropriately 50 - 74% of the time - May be physically or verbally inappropriate.  Problem Solving Problem solving assist level: Solves basic 75 - 89% of the time/requires cueing 10 - 24% of the time  Memory Memory assist level: Recognizes or recalls 25 - 49% of the time/requires cueing 50 - 75% of the time    Pain    Therapy/Group: Individual Therapy  Jeremy [name] 08/13/2017, 12:53 PM
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
## 2017-08-13 NOTE — Progress Notes (Signed)
Occupational Therapy Session Note  Patient Details  Name: Jeremy [name] MRN: [id number] Date of Birth: [birth year]  Today's Date: 08/13/2017 OT Individual Time: 1045-1200 OT Individual Time Calculation (min): 75 min    Short Term Goals: Week 1:  OT Short Term Goal 1 (Week 1): Pt will maintain sitting balance EOB with min A OT Short Term Goal 2 (Week 1): Pt will complete transfer with assist +1 in order to decrease caregiver burden OT Short Term Goal 3 (Week 1): Pt will attend to R UE during bathing/dressing task with min cuing OT Short Term Goal 4 (Week 1): Pt will recall hemi dressing technique with min questioning cues  Skilled Therapeutic Interventions/Progress Updates:    Pt seen for OT session focusing on ADL re-training and neuro re-ed with sitting balance, weight shift, and trunk control. Pt sitting up in tilt-in-space w/c upon arrival, requiring encouragement for participation but willing. Throughout session pt would state "no" to all requests and instructions of therapist, however, would do whatever was asked. He cont to demonstrate very poor insight into severity of deficits.  Kinesiotape applied to pt's R shoulder for subluxation support. He donned shirt with mod A, able to manage R UE with L hand with cuing to initiate threading of R side.  Pants threaded total A and pt stood at sink with min-mod to power up, however, +2 and total A to maintain upright posture and complete clothing management. Targets provided for pt to reach for in order to facilitate L weightshift and erect posture.  In therapy gym, completed +2 sliding board transfer to EOM. Addressed static and dynamic sitting balance EOM focusing on pt initiating righting reactions. Pt with no righting reactions with leans to R, he demonstrated ability to return to midline following anterior fall. Pt able to come forward from posterior LOB, however, requires cuing or target to complete, will not do voluntarily. During rest breaks,  manual facilitation in supported sitting provided for chest expansion and light message to pecs. Pt returned to w/c at end of session. Returned to room and left tilted in chair with QRB donned and mother present.   Therapy Documentation Precautions:  Precautions Precautions: Fall Precaution Comments: hemicraniectomy placement of bone flap abdominal pocket Required Braces or Orthoses: Other Brace/Splint Other Brace/Splint: Helmet when OOB Restrictions Weight Bearing Restrictions: No Pain:   No/ denies pain  See Function Navigator for Current Functional Status.   Therapy/Group: Individual Therapy  Rickiya Picariello L 08/13/2017, 7:10 AM

## 2017-08-13 NOTE — Progress Notes (Signed)
Physical Therapy Session Note  Patient Details  Name: Jeremy [name] MRN: [id number] Date of Birth: [birth year]  Today's Date: 08/13/2017 PT Individual Time: 1400-1430 PT Individual Time Calculation (min): 30 min   Short Term Goals: Week 1:  PT Short Term Goal 1 (Week 1): Pt will transfers consistently with max assist of 1.  PT Short Term Goal 2 (Week 1): Pt will maintain static sitting balance with mod assist x 2 min  PT Short Term Goal 3 (Week 1): Pt will initiate WC propulsion  PT Short Term Goal 4 (Week 1): Pt will perform bed mobility with max assist 50% of the time.  PT Short Term Goal 5 (Week 1): Pt will initiate gait training.   Skilled Therapeutic Interventions/Progress Updates:    Pt requiring max encouragement from therapist and family members for participation in therapy session and requiring it throughout. Pt consistently states "no" to everything and states he does not want to get better or work in therapies. Redirected and educated as able.Focused on NMR during bed mobility, sitting EOB to change shirt, and functional slideboard transfer to address postural control, midline orientation, weightbearing through RUE/RLE, and trunk control. Pt requiring up to max assist (could be min assist for seated balance at times) to maintain balance EOB due to no noted righting reactions or protective responses. Pt even states he would rather fall. Max assist for doffing/donning new shirt with cues for sequencing and completion of tasks. Total assist for slideboard transfer to w/c with focus on pt maintaining anterior and L lateral weightshift and attempting to push through BLE to assist with transfer. Repositioned in w/c with +2 assist and safety belt donned. Educated family on PROM techniques for RLE and importance of use of PRAFO and positioning of RUE.   Therapy Documentation Precautions:  Precautions Precautions: Fall Precaution Comments: hemicraniectomy placement of bone flap abdominal  pocket Required Braces or Orthoses: Other Brace/Splint Other Brace/Splint: Helmet when OOB Restrictions Weight Bearing Restrictions: No Pain: No reports of pain.  See Function Navigator for Current Functional Status.   Therapy/Group: Individual Therapy  Jeremy [name], Jeremy [name]  Jeremy [name] B. Marselino Slayton, PT, DPT  08/13/2017, 2:33 PM

## 2017-08-14 ENCOUNTER — Encounter (HOSPITAL_COMMUNITY): Payer: BLUE CROSS/BLUE SHIELD | Admitting: Psychology

## 2017-08-14 ENCOUNTER — Inpatient Hospital Stay (HOSPITAL_COMMUNITY): Payer: BLUE CROSS/BLUE SHIELD

## 2017-08-14 ENCOUNTER — Inpatient Hospital Stay (HOSPITAL_COMMUNITY): Payer: BLUE CROSS/BLUE SHIELD | Admitting: Occupational Therapy

## 2017-08-14 ENCOUNTER — Inpatient Hospital Stay (HOSPITAL_COMMUNITY): Payer: BLUE CROSS/BLUE SHIELD | Admitting: Physical Therapy

## 2017-08-14 DIAGNOSIS — I69314 Frontal lobe and executive function deficit following cerebral infarction: Secondary | ICD-10-CM

## 2017-08-14 DIAGNOSIS — I6350 Cerebral infarction due to unspecified occlusion or stenosis of unspecified cerebral artery: Secondary | ICD-10-CM

## 2017-08-14 DIAGNOSIS — I69919 Unspecified symptoms and signs involving cognitive functions following unspecified cerebrovascular disease: Secondary | ICD-10-CM

## 2017-08-14 DIAGNOSIS — F4323 Adjustment disorder with mixed anxiety and depressed mood: Secondary | ICD-10-CM

## 2017-08-14 LAB — CREATININE, SERUM: CREATININE: 0.84 mg/dL (ref 0.61–1.24)

## 2017-08-14 NOTE — Consult Note (Signed)
Neuropsychological Consultation   Patient:   Jeremy [name]   DOB:   1975-09-04  MR Number:  [id number]  Location:  [facility] [address] [id number] [place] [address] Dept: [phone] Loc: [phone]           Date of Service:   08/14/2017  Start Time:   3 PM End Time:   4 PM  Provider/Observer:  Arley Phenix, Psy.D.       Clinical Neuropsychologist       Billing Code/Service: [phone] 4 Units  Chief Complaint:    Jeremy [name] is a 42 year old male with history of hypertension but no antihypertensive medications.  Presented 07/27/2017 with right sided weakness, headache, facial droop and slurred speech.  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  Cerebral edema with midline shift developed.  Decompressive hemicraniectomy performed.  Patient has shown improving aphasia/expressive language symptoms and near complete right arm and right leg motor impairments.  Impulse control (almost completely verbal in nature) is noted.  Verbally resistant and negative interactions during some therapy sessions noted.  Receptive language functions appear to be intact.  Depressive and anxiety symptoms also present due to sudden loss of function.  Reason for Service:  Jeremy [name] was referred for neuropsychological/psycholoigcal consultation due to coping and adjustment issues and issues/concerns about motivation and verbal resistance to therapy and instructions.  Below is the HPI for the current admission.  HPI:Jeremy [name] a 42 y.o.right handed malewith history of hypertension on no antihypertensive medications. Presented 07/27/2017 with right-sided weakness, headache,facial droop and slurred speech. Per chart review patient lives with spouse.They have a 60-year-old son as well as wife is currently pregnant.One level home/thirdthird floor apartmentand they are currently seeking an apartment ground-level.Good  family support in the area.Independent and working prior to admission. Cranial CT scan showed left insula and frontal operculum lucency compatible with infarction.echocardiogram with ejection fraction of 65% no wall motion abnormalities. No defect or PFO identified. CT Angiography head and neck showed left M1 occlusion with intermediate left MCA distribution collateralization. Underwentattemptedmechanical thrombectomy per interventional radiologywhichwas unsuccessful and required intubation. Follow-up MRI showed cerebral edema with midline shift maintained on 3% saline. Underwent left decompressive hemicraniectomy placement of bone flap abdominal pocket 07/29/2017 per Dr.Nundkumar.Initially maintained onCleviprex.Patient was extubated 06/02/2018 NPOwith tube feeds for nutritional supportand diet advanced to a dysphagia #1 nectar thick liquid. Follow-up CT 08/03/2017 stable no new changes.Bilateral lower extremity Dopplers negative.TEE was attempted 08/06/2017 but unable to perform.No plan for loop recorder.Subcutaneous Lovenox was later initiated for DVT prophylaxis. Physicaland occupationaltherapy evaluationscompletedwithrecommendations of physical medicine rehabilitation consult.Patient was admitted for a comprehensive rehabilitation program  Current Status:  While the patient will repeat refusal for parts of therapy, he does in up doing the theraputic interventions.  He is critical and verbally negative to therapists.  He can also be very negative to his son, wife and mother, openly questioning them.  Frustration and depressive symptoms present.   Behavioral Observation: Jeremy [name]  presents as a 42 y.o.-year-old Right handed Male who appeared his stated age. his dress was Appropriate and he was Well Groomed and his manners were Appropriate, inappropriate at times to the situation.  his participation was indicative of Appropriate, Redirectable and Resistant behaviors.  There were any  physical disabilities noted.  he displayed an appropriate level of cooperation and motivation.     Interactions:    Active Appropriate and Redirectable  Attention:   abnormal and attention  span appeared shorter than expected for age  Memory:   abnormal; remote memory intact, recent memory impaired  Visuo-spatial:  within normal limits  Speech (Volume):  low  Speech:   slurred;   Thought Process:  Coherent and Relevant  Though Content:  WNL; not suicidal  Orientation:   person, place, time/date and situation  Judgment:   Poor  Planning:   Poor  Affect:    Anxious, Defensive and Depressed  Mood:    Anxious, Depressed and Irritable  Insight:   Shallow  Intelligence:   normal  Marital Status/Living: The patient is married and has a 40 year old son and his wife is pregnant (due in April) as will as having his mother come down from [place] to help for a period of time.   Medical History:   Past Medical History:  Diagnosis Date  . Anxiety   . Asthma    MILD AS A CHILD  . Depression   . Deviated septum   . Dyspnea    NORMAL SPIROMETRY 02/01/17 VISIT WITH DR HEDRICK  . Hypertension   . Sleep apnea         Psychiatric History:  Patient has prior diagnosis of both depression and anxiety in the past.  Family Med/Psych History: No family history on file.  Risk of Suicide/Violence: low At this point the patient has severe impairments in right side motor functioning.  While he can be very negative verbally and even threaten physical assault upon others or be verbally critical, this is likely more of his prior behavioral patterns magnified following left frontal lobe brain injury.    Impression/DX:  Jeremy [name] is a [age] year old male with history of hypertension but no antihypertensive medications.  Presented 07/27/2017 with right sided weakness, headache, facial droop and slurred speech.  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  Cerebral edema with midline shift  developed.  Decompressive hemicraniectomy performed.  Patient has shown improving aphasia/expressive language symptoms and near complete right arm and right leg motor impairments.  Impulse control (almost completely verbal in nature) is noted.  Verbally resistant and negative interactions during some therapy sessions noted.  Receptive language functions appear to be intact.  Depressive and anxiety symptoms also present due to sudden loss of function.  While the patient will repeat refusal for parts of therapy, he does in up doing the theraputic interventions.  He is critical and verbally negative to therapists.  He can also be very negative to his son, wife and mother, openly questioning them.  Frustration and depressive symptoms present.   Extensive talks with patient, wife and mother suggest that prior to this TBI that the patient was a contrarian in personality style but was not abusive and had good intentions.  With reductions in expressive fluency and left frontal brain injury and his negative feelings following significant loss of function, what is expressed as far as resistance has been exaggerated and amplified.  He has lost his verbal filter and his own frustration is being projected upon others.  The intention is not to harm or negate them but likely more of misplaced underlying negative feelings about self and loss of function.    Disposition/Plan:  Will see the patient on Monday Feb 4th.  Diagnosis:    Motor and executive functioning deficits        Electronically Signed   _______________________ Arley PhenixJohn Mekaila Tarnow, Psy.D.

## 2017-08-14 NOTE — Progress Notes (Signed)
Patient c/o not seeing as well from left as he did yesterday. On-call notified. CT ordered. Neuro called but are unable to see without consult.

## 2017-08-14 NOTE — Progress Notes (Signed)
Speech Language Pathology Daily Session Note  Patient Details  Name: Jeremy [name] MRN: [id number] Date of Birth: [birth year]  Today's Date: 08/14/2017 SLP Individual Time: 1100-1200 SLP Individual Time Calculation (min): 60 min  Short Term Goals: Week 1: SLP Short Term Goal 1 (Week 1): Pt will consume trials of ice chips with overt s/s of aspiration to demonstrate readiness for repeat instrumental study.  SLP Short Term Goal 2 (Week 1): Pt will expand utterance to sentence level with Mod A cues.  SLP Short Term Goal 3 (Week 1): Pt will complete basic familiar tasks with Mod A cues.  SLP Short Term Goal 4 (Week 1): Pt will demonstrate task initiation with Mod A cues in 50% of opportunities.  SLP Short Term Goal 5 (Week 1): Pt will sustain attention to task for ~ 20 minutes with Mod A cues.  SLP Short Term Goal 6 (Week 1): Pt will demonstrate intellectual awarness by answering yes/no questions related to physical and cognitive deficits with 75% accuracy and Mod A cues.   Skilled Therapeutic Interventions: Skilled ST services focused on swallow and speech skills. SLP facilitated oral care piror to ice chip trials, pt demonstrated 2 overt s/s aspiration and required max A verbal/visual cues to demonstrate strong cough/throat clear. Pt noted to demonstrate overt s/s aspiration with salvia management during session on 3 occurrence. Pt required mod a verbal cues to maintain head in neural position, leaning to right side. SLP facilitated utterance expansion with picture cards, pt required mod A verbal cues for sentence level. SLP facilitated basic problem solving and task initiation utilizing "what's wrong" cards requiring min A verbal cues and mod A verbal cues when idenitfying three difference. Pt required mod A verbal cues for task initiation in 75% of opportunities. SLP educated mom how to thicken liquid to nectar thick, mom returned demonstration. SLP signed mom off to thicken liquid. Pt was left in room  with mom. Recommend to continue skilled ST services.      Function:  Eating Eating   Modified Consistency Diet: No(ice chip trials) Eating Assist Level: Supervision or verbal cues;Helper feeds patient   Eating Set Up Assist For: Opening containers       Cognition Comprehension Comprehension assist level: Follows basic conversation/direction with no assist  Expression   Expression assist level: Expresses basic 75 - 89% of the time/requires cueing 10 - 24% of the time. Needs helper to occlude trach/needs to repeat words.;Expresses basic 50 - 74% of the time/requires cueing 25 - 49% of the time. Needs to repeat parts of sentences.  Social Interaction Social Interaction assist level: Interacts appropriately 50 - 74% of the time - May be physically or verbally inappropriate.  Problem Solving Problem solving assist level: Solves basic 75 - 89% of the time/requires cueing 10 - 24% of the time;Solves basic 50 - 74% of the time/requires cueing 25 - 49% of the time  Memory Memory assist level: Recognizes or recalls 25 - 49% of the time/requires cueing 50 - 75% of the time    Pain Pain Assessment Pain Assessment: No/denies pain  Therapy/Group: Individual Therapy  Kerra Guilfoil  [facility] 08/14/2017, 4:57 PM

## 2017-08-14 NOTE — Progress Notes (Signed)
Physical Therapy Session Note  Patient Details  Name: Jeremy [name] MRN: [id number] Date of Birth: 1976/06/09  Today's Date: 08/14/2017 PT Individual Time: 1300-1400 PT Individual Time Calculation (min): 60 min   Short Term Goals: Week 1:  PT Short Term Goal 1 (Week 1): Pt will transfers consistently with max assist of 1.  PT Short Term Goal 2 (Week 1): Pt will maintain static sitting balance with mod assist x 2 min  PT Short Term Goal 3 (Week 1): Pt will initiate WC propulsion  PT Short Term Goal 4 (Week 1): Pt will perform bed mobility with max assist 50% of the time.  PT Short Term Goal 5 (Week 1): Pt will initiate gait training.   Skilled Therapeutic Interventions/Progress Updates: Pt presented in bed with mother [name] present requiring encouragement to participate in therapy. Pt performed supine to sit with maxA and cues for hand placement of LUE on bed rail to facilitate transfer. Performed SB transfer to TIS chair maxA x 2 with PTA providing HOH assist for LUE placement and blocking to encourage increased head/hips relationship. Pt transtported to rehab gym and performed SB transfer in same manner to mat. Participated in seated static balance activities with PTA providing max tactile cues for decreased R lean, keeping head upright, and achieving neutral posture. Pt able to maintain for bouts of 10-20 seconds with modA. Pt performed reaching in front and behind picking up cards for trunk activation with modA and increased encouragement. PTA placed pt in standing frame x 3 min with pt able to maintain fair trunk control x 3 min then pt indicating need to toilet. Pt returned to chair and returned to room and nsg and NT advised of pt's needs. Pt left in TIS chair with mom in room and staff notified.      Therapy Documentation Precautions:  Precautions Precautions: Fall Precaution Comments: hemicraniectomy placement of bone flap abdominal pocket Required Braces or Orthoses: Other  Brace/Splint Other Brace/Splint: Helmet when OOB Restrictions Weight Bearing Restrictions: No General:   Vital Signs: Therapy Vitals Temp: 100 F (37.8 C) Temp Source: Oral Pulse Rate: 81 Resp: 16 BP: 121/81 Patient Position (if appropriate): Sitting Oxygen Therapy SpO2: 98 % O2 Device: Not Delivered  See Function Navigator for Current Functional Status.   Therapy/Group: Individual Therapy  Detria Cummings  Charmian Forbis, PTA  08/14/2017, 3:32 PM

## 2017-08-14 NOTE — Progress Notes (Signed)
Placed a call to Dr. Danielle DessElsner regarding Mr. Barfuss complaints of worsening vision on the left side. He agrees with CT Scan, we will await results.

## 2017-08-14 NOTE — Progress Notes (Signed)
Occupational Therapy Session Note  Patient Details  Name: Jeremy [name] MRN: [id number] Date of Birth: [birth year]  Today's Date: 08/14/2017 OT Individual Time: 0950-1100 OT Individual Time Calculation (min): 70 min    Short Term Goals: Week 1:  OT Short Term Goal 1 (Week 1): Pt will maintain sitting balance EOB with min A OT Short Term Goal 2 (Week 1): Pt will complete transfer with assist +1 in order to decrease caregiver burden OT Short Term Goal 3 (Week 1): Pt will attend to R UE during bathing/dressing task with min cuing OT Short Term Goal 4 (Week 1): Pt will recall hemi dressing technique with min questioning cues  Skilled Therapeutic Interventions/Progress Updates:    Pt seen for OT session focusing on ADL re-training, functional transfers, R awareness. Pt in supine upon arrival, voicing desiring to get on bedpan, with encouragement willing to get on BSC. He transferred to EOB with mod A and VCs for technique. STEDY with +2 assist required for balance and management of lift [facility], pt able to power into standing with min A, however, once standing or sitting in STEDY requires mod-max A for static balance. Pt required overall min A for sitting balance on BSC with STEDY in place for visual feedback of midline orientation. Pt with R lean which he was able to self correct with VCs, able to maintain ~15 seconds before beginning to lean R again, pt unable to self recognize and thus independently correct R lean. Pt continent of BM, stood into STEDY with +2 for clothing management and hygiene. Pt ate breakfast in therapy day room, R UE placed in weightbearing position and pt required mod fading to min cuing to locate all needed items on R side of meal tray. He required mod cuing for small bites and rate of speed with self- feeding. In room, completed oral hygiene via suction, pt able to attend to all areas of mouth independently. Completed UB bathing/dressing from w/c level at sink, VCs for  awareness and management of R UE. Dynamic sitting task in w/c, focusing on weight shifting and attention to R. Pt returned to room at end of session, left sitting up in tilt-in-space w/c with QRB donned, mom present and awaiting SLP hand off.   Therapy Documentation Precautions:  Precautions Precautions: Fall Precaution Comments: hemicraniectomy placement of bone flap abdominal pocket Required Braces or Orthoses: Other Brace/Splint Other Brace/Splint: Helmet when OOB Restrictions Weight Bearing Restrictions: No Pain:    No/ denies pain   See Function Navigator for Current Functional Status.   Therapy/Group: Individual Therapy  Damian Buckles L 08/14/2017, 6:58 AM

## 2017-08-14 NOTE — Progress Notes (Signed)
[name] stating his vision on the left side has become worse. Neuro surgery will be called. CT Scan without contrast ordered. Awaiting Results.
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
## 2017-08-15 ENCOUNTER — Inpatient Hospital Stay (HOSPITAL_COMMUNITY): Payer: BLUE CROSS/BLUE SHIELD | Admitting: Occupational Therapy

## 2017-08-15 ENCOUNTER — Inpatient Hospital Stay (HOSPITAL_COMMUNITY): Payer: BLUE CROSS/BLUE SHIELD | Admitting: Physical Therapy

## 2017-08-15 ENCOUNTER — Ambulatory Visit (HOSPITAL_COMMUNITY): Payer: BLUE CROSS/BLUE SHIELD

## 2017-08-15 NOTE — Progress Notes (Signed)
Orthopedic Tech Progress Note Patient Details:  Jeremy [name] 06/06/1976 [id number]  Ortho Devices Type of Ortho Device: Arm sling Ortho Device/Splint Location: rue Ortho Device/Splint Interventions: Application   Post Interventions Patient Tolerated: Well Instructions Provided: Care of device   [name] 08/15/2017, 12:35 PM  
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
## 2017-08-15 NOTE — Patient Care Conference (Signed)
Inpatient RehabilitationTeam Conference and Plan of Care Update Date: 08/15/2017   Time: 10:45 Am    Patient Name: Jeremy [name]      Medical Record Number: [id number]  Date of Birth: 01-10-1976 Sex: Male         Room/Bed: 4W15C/4W15C-01 Payor Info: Payor: BLUE CROSS BLUE [employer]IELD / Plan: BCBS OTHER / Product Type: *No Product type* /    Admitting Diagnosis: Stroke CVA  Admit Date/Time:  08/07/2017  8:36 PM Admission Comments: No comment available   Primary Diagnosis:  <principal problem not specified> Principal Problem: <principal problem not specified>  Patient Active Problem List   Diagnosis Date Noted  . Frontal lobe and executive function deficit following cerebral infarction   . Adjustment disorder with mixed anxiety and depressed mood   . 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 Aphasia due to acute stroke (HCC) 08/07/2017  . B12 deficiency   . Acute respiratory failure (HCC)   . Central line infiltration (HCC)   . Endotracheal tube present   . Fever   . Leukocytosis   . S/P craniotomy 07/30/2017  . Cerebral edema (HCC) 07/28/2017  . Hyperlipidemia 07/28/2017  . Stroke (cerebrum) (HCC) 07/27/2017    Expected Discharge Date: Expected Discharge Date: 09/04/17  Team Members Present: Physician leading conference: Dr. Claudette LawsAndrew Kirsteins Social Worker Present: Dossie DerBecky Rachael Ferrie, LCSW Nurse Present: Kennyth ArnoldStacey Jennings, RN PT Present: Grier RocherAustin Tucker, PT OT Present: Johnsie CancelAmy Lewis, OT SLP Present: Colin BentonMadison Cratch, SLP PPS Coordinator present : Tora DuckMarie Noel, RN, CRRN     Current Status/Progress Goal Weekly Team Focus  Medical   pt resists instruction, very severe lean to Right side, No movement RUE  appropriae behaviour, Upgrade diet  improve intake and attention   Bowel/Bladder   Continent/Incontinent of B&B.  Continent of B&B.  Assess for touleting needs and keep Clean and dry.   Swallow/Nutrition/ Hydration   dys 1 and NTL, with ice chips trials   Supervision with least  restrictive diet  Mod- Max A, trials of ice chips , weak cough/throat clear   ADL's   Min- max A sitting balance, Max A with +2 for safety functional transfers using STEDY. Total A +2 toileting using STEDY, Mod A UB dressing, total A +2 LB bathing/dressing  Min A overall  Functional sitting balance, righting reactions, functional transfers, neuro re-ed, education   Mobility   Supine to sit maxA, SB transfers maxAx2 to total A. static sitting balance maxA with intermittent modA  min transfers. Supervision WC mobility.   bed mobility, transfers, sitting balance    Communication   Mod A, cognitive base   Min A to supervision  expanding utterence length   Safety/Cognition/ Behavioral Observations  Max-Mod A  Min A  task initation, basic problem solving, attention    Pain   C/O occassional H/A's.  <3.  Assess q shift and PRN.   Skin   Surgical inscions well approximated, staples in place, and no s/s of infection.  Maintain skin integrity.  Assess skin q shift and PRN.      *See Care Plan and progress notes for long and short-term goals.     Barriers to Discharge  Current Status/Progress Possible Resolutions Date Resolved   Physician    Medical stability;Inaccessible home environment;Decreased caregiver support  now moved to 1st floor apt  slow progress  work on behaviour      Nursing                  PT  OT                  SLP                SW                Discharge Planning/Teaching Needs:  Mom here from [employer] to provide physical assist since wife is almost 8 months pregnant. Mom really wants him to be supervision level. Both Mom and wife here daily. Neuro-psych seeing for coping      Team Discussion:  Goals overall min assist level. Slow progress, still working on balance issues, problem solving and attention. Pain behind left eye-CT scan showed improvement. Sutures to be removed today. Dys 1 nectar diet trials of ice chips with speech. Neuro-psych seeing  for coping  Revisions to Treatment Plan:  DC 2/19    Continued Need for Acute Rehabilitation Level of Care: The patient requires daily medical management by a physician with specialized training in physical medicine and rehabilitation for the following conditions: Daily direction of a multidisciplinary physical rehabilitation program to ensure safe treatment while eliciting the highest outcome that is of practical value to the patient.: Yes Daily medical management of patient stability for increased activity during participation in an intensive rehabilitation regime.: Yes Daily analysis of laboratory values and/or radiology reports with any subsequent need for medication adjustment of medical intervention for : Neurological problems;Other  [name], Lemar Livings 08/15/2017, 12:01 PM

## 2017-08-15 NOTE — Progress Notes (Signed)
Patient left for CT at approximately 2200 and returned at approximately 2240. Patient resting quietly at present with no s/s of distress.

## 2017-08-15 NOTE — Progress Notes (Signed)
Physical Therapy Session Note  Patient Details  Name: Jeremy [name] MRN: [id number] Date of Birth: 1975/12/21  Today's Date: 08/15/2017 PT Individual Time:1300-1415   75 min   Short Term Goals: Week 1:  PT Short Term Goal 1 (Week 1): Pt will transfers consistently with max assist of 1.  PT Short Term Goal 2 (Week 1): Pt will maintain static sitting balance with mod assist x 2 min  PT Short Term Goal 3 (Week 1): Pt will initiate WC propulsion  PT Short Term Goal 4 (Week 1): Pt will perform bed mobility with max assist 50% of the time.  PT Short Term Goal 5 (Week 1): Pt will initiate gait training.   Skilled Therapeutic Interventions/Progress Updates:   Pt received sitting in WC and agreeable to PT  SB transfer to and from mat table +2 for set up. Mod-max assist from PT for safe transfer. Max cues for improved trunk activation to advance across board.   Sitting balance and postural control x 45 minutes on mat table with reaching L, forward, and up to improve activation of R side trunk closure. Static sitting without UE support with up to 15 seconds of supervision assist to maintain balance. Pt able to correct L/anterior LOB 25% of the time with max cues for awareness of LOB. Max cues for  Gait training instructed by PT at rail in hall x 35f with max-total assist +2 for WC follow. Pt noted to have increased pushing tendencies on this day requiring max multimodal cues for WB through the RLE and decreased extensor pattern movement in the LLE.   [facility] and all needs met.           Therapy Documentation Precautions:  Precautions Precautions: Fall Precaution Comments: hemicraniectomy placement of bone flap abdominal pocket Required Braces or Orthoses: Other Brace/Splint Other Brace/Splint: Helmet when OOB Restrictions Weight Bearing Restrictions: No Pain:    Denies  See Function Navigator for Current Functional  Status.   Therapy/Group: Individual Therapy  ALorie Phenix1/30/2019, 2:07 PM

## 2017-08-15 NOTE — Progress Notes (Signed)
Occupational Therapy Weekly Progress Note  Patient Details  Name: Jeremy [name] MRN: [id number] Date of Birth: 1975/07/21  Beginning of progress report period: August 08, 2017 End of progress report period: August 15, 2017  Today's Date: 08/15/2017 OT Individual Time: 0930-1030 OT Individual Time Calculation (min): 60 min    Patient has met 1 of 4 short term goals.  Pt is making very slow progress towards OT goals. He cont to require encouragement for participation, repeatedly stating "no" at all requests from therapists, however, has never not done what is asked of him.  He cont to have very poor postural control and minimal righting reactions, especially with LOB to R. He cont to require +2 for safety during functional transfers using sliding board or STEADY. When transferring to L, pt is able to reach w/c armrest and assist with pulling self into chair, however, requires max A for balance with anterior weight shift. Pt's mother has been present throughout and is aware of pt's current physical and cognitive deficits. Pt's wife has been present during the afternoon as well. Unsure if they completely understand severity of pt's impairments.   Patient continues to demonstrate the following deficits:abnormal posture, acute pain, apraxia, ataxia, cognitive deficits, disturbance of vision, flaccid hemiplegia and hemiparesis, hemiplegia affecting non-dominant side and muscle weakness (generalized) and therefore will continue to benefit from skilled OT intervention to enhance overall performance with BADL and Reduce care partner burden.  Patient progressing toward long term goals..  Continue plan of care.  OT Short Term Goals Week 1:  OT Short Term Goal 1 (Week 1): Pt will maintain sitting balance EOB with min A OT Short Term Goal 1 - Progress (Week 1): Progressing toward goal OT Short Term Goal 2 (Week 1): Pt will complete transfer with assist +1 in order to decrease caregiver burden OT Short Term  Goal 2 - Progress (Week 1): Not met OT Short Term Goal 3 (Week 1): Pt will attend to R UE during bathing/dressing task with min cuing OT Short Term Goal 3 - Progress (Week 1): Progressing toward goal OT Short Term Goal 4 (Week 1): Pt will recall hemi dressing technique with min questioning cues OT Short Term Goal 4 - Progress (Week 1): Met Week 2:  OT Short Term Goal 1 (Week 2): Pt will complete transfer with assist +1 in order to decrease caregiver burden OT Short Term Goal 2 (Week 2): Pt will maintain sitting balance EOB with min A OT Short Term Goal 3 (Week 2): Pt will display improved righting reactions, able to self correct LOB with min cuing  Skilled Therapeutic Interventions/Progress Updates:    Pt seen for OT session focusing on ADL re-training, attention to R during functional tasks, and standing balance/endurance. Pt in supine upon arrival, requiring encouragement for participation. He refused to choose clothing items to wear and throughout required significantly increased time and encouragement to complete all tasks.  He transferred to EOB with mod A, able to manage R UE into side lying position and able to assist with pushing up into sitting. Completed max A sliding board transfer to w/c with +2 to assist with [place] equipment.  Completed UB/LB dressing from w/c level, with cuing, pt able to manage R UE into shirt sleeve and with VCs sequence donning of shirt. He donned pants from w/c,  With VCs for problem solving and initiating crossing ankle over knee to thread pants. Pt required increased time and encouragement in order to lean forward to thread pants. Total A  to place R LE into figure four and pt able to thread onto R LE, socks donned in same manner. He stood at sink with min A to power up, however, intial max A for standing balance due to R lean, max multimodal cuing with visual feedback and having pt reach to superior L quadrant to promote L weight shift. +2 for clothing management  as pt unable to maintain standing balance. Completed x3 during session.  Completed oral hygiene from w/c level using suction toothbrush. Pt left seated in w/c at end of session, QRB donned and mother present.   Therapy Documentation Precautions:  Precautions Precautions: Fall Precaution Comments: hemicraniectomy placement of bone flap abdominal pocket Required Braces or Orthoses: Other Brace/Splint Other Brace/Splint: Helmet when OOB Restrictions Weight Bearing Restrictions: No Pain:   No/ denies pain  See Function Navigator for Current Functional Status.   Therapy/Group: Individual Therapy  Champagne Paletta L 08/15/2017, 7:18 AM

## 2017-08-15 NOTE — Progress Notes (Signed)
Staples removed from patient's left scalp and right abdomen per MD order. Patient tolerated procedure well. No steri-strips needed at time of removal.

## 2017-08-15 NOTE — Progress Notes (Signed)
Discussed with Amy Rounds, OT concerning not using bedpan any longer with patient. Both OT and this RN discussed using the Maxi-sky to assist patient to the [facility] and patient must have hands on at all times due to left inattention and left lean. Sling for maxi-sky has been placed in patient room and new plan has been passed on to next shift. OT and this RN are in agreement that patient will be able to move his bowels better in an upright position.

## 2017-08-15 NOTE — Progress Notes (Signed)
Orthopedic Tech Progress Note Patient Details:  Cletus GashJao Brubacher 08/22/1975 [id number]  Ortho Devices Type of Ortho Device: Arm sling Ortho Device/Splint Location: rue Ortho Device/Splint Interventions: Application   Post Interventions Patient Tolerated: Well Instructions Provided: Care of device   Nikki DomCrawford, Inanna Telford 08/15/2017, 12:35 PM

## 2017-08-15 NOTE — Progress Notes (Signed)
Social Work Patient ID: [name], male   DOB: 02-Mar-1976, 42 y.o.   MRN: [id number]  Met with pt, wife and young son to inform team conference goals min assist level and target discharge date of 2/19. Pt becomes discouraged at times due to being so active and seeing how much assist he requires. Discussed he is making progress and to keep pushing forward. Wife and son are both encouraging to pt. Both relieved his CT scan showed improvement. Will work on discharge needs and provide support. Continue to have neuro-psych see for coping and support.

## 2017-08-15 NOTE — Progress Notes (Signed)
Speech Language Pathology Weekly Progress and Session Note  Patient Details  Name: Jeremy [name] MRN: [id number] Date of Birth: 11-23-1975  Beginning of progress report period: August 07, 2017 End of progress report period: August 15, 2017  Today's Date: 08/15/2017 SLP Individual Time: 1100-1200 SLP Individual Time Calculation (min): 60 min  Short Term Goals: Week 1: SLP Short Term Goal 1 (Week 1): Pt will consume trials of ice chips with overt s/s of aspiration to demonstrate readiness for repeat instrumental study.  SLP Short Term Goal 1 - Progress (Week 1): Not met SLP Short Term Goal 2 (Week 1): Pt will expand utterance to sentence level with Mod A cues.  SLP Short Term Goal 2 - Progress (Week 1): Not met SLP Short Term Goal 3 (Week 1): Pt will complete basic familiar tasks with Mod A cues.  SLP Short Term Goal 3 - Progress (Week 1): Met SLP Short Term Goal 4 (Week 1): Pt will demonstrate task initiation with Mod A cues in 50% of opportunities.  SLP Short Term Goal 4 - Progress (Week 1): Met SLP Short Term Goal 5 (Week 1): Pt will sustain attention to task for ~ 20 minutes with Mod A cues.  SLP Short Term Goal 5 - Progress (Week 1): Not met SLP Short Term Goal 6 (Week 1): Pt will demonstrate intellectual awarness by answering yes/no questions related to physical and cognitive deficits with 75% accuracy and Mod A cues.  SLP Short Term Goal 6 - Progress (Week 1): Not met    New Short Term Goals: Week 2: SLP Short Term Goal 1 (Week 2): Pt will consume trials of ice chips with overt s/s of aspiration to demonstrate readiness for repeat instrumental study.  SLP Short Term Goal 2 (Week 2): Pt will expand utterance to sentence level with Mod A cues.  SLP Short Term Goal 3 (Week 2): Pt will complete basic familiar tasks with Min A cues.  SLP Short Term Goal 4 (Week 2): Pt will demonstrate task initiation with Min A cues in 80% of opportunities.  SLP Short Term Goal 5 (Week 2): Pt will  sustain attention to task for ~ 20 minutes with Mod A cues.  SLP Short Term Goal 6 (Week 2): Pt will demonstrate intellectual awarness by answering yes/no questions related to physical and cognitive deficits with 75% accuracy and Mod A cues.   Weekly Progress Updates: Pt made minimal progress meeting 2 out 6 goals, due to slow progression of intellectual awareness.Pt demonstrates increase in insight at the end of the week and increase in compliance to task including task initation, and basic problem solving. Pt demonstrated min overt s/s aspiration with ice chips trials. Pt would continue to benefit from skilled ST service with novel increase in awareness in order to maximize functional independence and reduce burden of care.     Intensity: Minumum of 1-2 x/day, 30 to 90 minutes Frequency: 3 to 5 out of 7 days Duration/Length of Stay: 2/19 Treatment/Interventions: Cognitive remediation/compensation;Cueing hierarchy;Dysphagia/aspiration precaution training;Functional tasks;Patient/family education;Therapeutic Activities;Speech/Language facilitation;Multimodal communication approach   Daily Session  Skilled Therapeutic Interventions: Skilled ST services focused on swallow, cognitive skills and family education.  SLP facilitated basic problem solving with 3-5 step sequence tasks, pt required min A verbal cues for 4 step and mod A verbal cues for 5 step. Pt required mod A verbal cues for monitoring/correcting functional errors. Pt demonstrated expansion of utterance at phrase level in 90% of opportunities with max a verbal cues during card sequence tasks.  Pt demonstrated spontaneous expressive phrases in 10% of opportunities SLP facilitated supervision of dys 1 and nectar thick liquids, instructing mom in feeding and providing cues. Pt required max a verbal/tatcile cues to slow rate, however no overt s/s noted on salvia and mild right side labial residue. SLP signed mom off to supervision meals. Continue  plan of care.   Function:   Eating Eating   Modified Consistency Diet: Yes Eating Assist Level: Supervision or verbal cues;Helper feeds patient           Cognition Comprehension Comprehension assist level: Follows basic conversation/direction with no assist  Expression   Expression assist level: Expresses basic 75 - 89% of the time/requires cueing 10 - 24% of the time. Needs helper to occlude trach/needs to repeat words.;Expresses basic 50 - 74% of the time/requires cueing 25 - 49% of the time. Needs to repeat parts of sentences.  Social Interaction Social Interaction assist level: Interacts appropriately 50 - 74% of the time - May be physically or verbally inappropriate.  Problem Solving Problem solving assist level: Solves basic 75 - 89% of the time/requires cueing 10 - 24% of the time;Solves basic 50 - 74% of the time/requires cueing 25 - 49% of the time  Memory Memory assist level: Recognizes or recalls 25 - 49% of the time/requires cueing 50 - 75% of the time   General    Pain Pain Assessment Pain Assessment: No/denies pain  Therapy/Group: Individual Therapy  Cataleyah Colborn  [facility] 08/15/2017, 5:44 PM

## 2017-08-16 ENCOUNTER — Inpatient Hospital Stay (HOSPITAL_COMMUNITY): Payer: BLUE CROSS/BLUE SHIELD | Admitting: Speech Pathology

## 2017-08-16 ENCOUNTER — Inpatient Hospital Stay (HOSPITAL_COMMUNITY): Payer: BLUE CROSS/BLUE SHIELD | Admitting: Physical Therapy

## 2017-08-16 ENCOUNTER — Inpatient Hospital Stay (HOSPITAL_COMMUNITY): Payer: BLUE CROSS/BLUE SHIELD | Admitting: Occupational Therapy
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
## 2017-08-16 NOTE — Progress Notes (Signed)
Physical Therapy Weekly Progress Note  Patient Details  Name: Jeremy [name] MRN: [id number] Date of Birth: [birth year]  Beginning of progress report period: August 08, 2017 End of progress report period: August 16, 2017  Today's Date: 08/16/2017 PT Individual Time: 1415-1530 PT Individual Time Calculation (min): 75 min   Patient has met 2 of 5 short term goals.  Pt making slow progress towards Long term goals. Dense R hemiplegia with significant strength and sensation deficits as well as poor insight and lack of motivation have been this patient's most limiting factors. Pt has show slight improvement in upright tolerance, sitting balance, bed mobility, and transfer, but continues to require max assist at best for sitting balance and safe transfers to and from [facility].   Patient continues to demonstrate the following deficits muscle weakness and muscle paralysis, impaired timing and sequencing, abnormal tone, motor apraxia and decreased motor planning, decreased visual acuity and decreased visual perceptual skills, decreased midline orientation, decreased attention to right, right side neglect and decreased motor planning, decreased initiation, decreased attention, decreased awareness, decreased problem solving, decreased safety awareness and decreased memory and decreased sitting balance, decreased standing balance, decreased postural control, hemiplegia and decreased balance strategies and therefore will continue to benefit from skilled PT intervention to increase functional independence with mobility.  Patient progressing toward long term goals..  Continue plan of care.  PT Short Term Goals Week 1:  PT Short Term Goal 1 (Week 1): Pt will transfers consistently with max assist of 1.  PT Short Term Goal 1 - Progress (Week 1): Progressing toward goal PT Short Term Goal 2 (Week 1): Pt will maintain static sitting balance with mod assist x 2 min  PT Short Term Goal 2 - Progress (Week 1): Not met PT Short  Term Goal 3 (Week 1): Pt will initiate WC propulsion  PT Short Term Goal 3 - Progress (Week 1): Not met PT Short Term Goal 4 (Week 1): Pt will perform bed mobility with max assist 50% of the time.  PT Short Term Goal 4 - Progress (Week 1): Met PT Short Term Goal 5 (Week 1): Pt will initiate gait training.  PT Short Term Goal 5 - Progress (Week 1): Met Week 2:  PT Short Term Goal 1 (Week 2): Pt will propell WC 50f with max assist.  PT Short Term Goal 2 (Week 2): Pt will sit EOB with mod assist x 2 minutes.  PT Short Term Goal 3 (Week 2): Pt will perform bed mobility with mod assist  PT Short Term Goal 4 (Week 2): [facility]  Skilled Therapeutic Interventions/Progress Updates:    pregait NMR at rail in hall  Sit<>stand 2 x 15 with mod-max assist .Weight shifting R and L 2 x 3 minutes. LLE stepping to force weight through the RLE. minisquat with mod assist to force WB through the RLE.   Gait training at rail in hall x 260fwith max +2 assist. Visual feed back from mirror allow mild improvements in posture, midline orientation and weight shifting.   Nustep reciprocal movement training with BLE and R thigh support. 2 x 5 minutes progressing from level 1 to 5. Pt required to initiate acitivation  of extensor musculature throughout each 5 min, but pt able to complete extension of RLE    Transfers to and from Nustep with squat pivot. Max assist from PT and max cues for  Sequencing and safety   Patient returned to  room and left sitting in [facility] with call bell in reach and all needs met.            Therapy Documentation Precautions:  Precautions Precautions: Fall Precaution Comments: hemicraniectomy placement of bone flap abdominal pocket Required Braces or Orthoses: Other Brace/Splint Other Brace/Splint: Helmet when OOB Restrictions Weight Bearing Restrictions: No    Pain: Denies   See Function Navigator for Current Functional  Status.  Therapy/Group: Individual Therapy  [name] 08/16/2017, 3:40 PM

## 2017-08-16 NOTE — Progress Notes (Signed)
Occupational Therapy Session Note  Patient Details  Name: Jeremy [name] MRN: [id number] Date of Birth: 10-19-1975  Today's Date: 08/16/2017 OT Individual Time: 1100-1200 OT Individual Time Calculation (min): 60 min    Short Term Goals: Week 2:  OT Short Term Goal 1 (Week 2): Pt will complete transfer with assist +1 in order to decrease caregiver burden OT Short Term Goal 2 (Week 2): Pt will maintain sitting balance EOB with min A OT Short Term Goal 3 (Week 2): Pt will display improved righting reactions, able to self correct LOB with min cuing  Skilled Therapeutic Interventions/Progress Updates:    Pt seen for OT session focusing on ADL re-training and functional sitting balance. Pt sitting up in tilt-in-space w/c upon arrival, shaking head "No", but doing everything asked by therapist.  He donned shirt, cues to recall hemi dressing technique, though difficult to assess if it is due to pt's behavioral issues. He was able to assist with threading shirt over R arm, threaded L UE and with assist able to lift shirt overhead.  He stood at sink with min A to stand and max A initially for standing balance, able to correct with cues to min A, however, could quickly loose balance to R requiring total A to regain balance.  Pt able to place shaving cream on face, min cuing for attention to R side. Total A shaving for safety. In therapy gym, pt completed max A sliding board transfer to L with multimodal cuing for head/hip relationship. Addressed static sitting balance EOM. Pt overall min A for static sitting balance with mirror for visual feedback and max cuing for awareness and weight shift. When pt given "bribe" to sit unsupported for certain amount of time, he was able to sit for 15 seconds with close supervision. He is able to self correct LOB from all areas except for LOB to R. Pt returned to w/c in same manner as described above. Pt left seated in w/c at end of session, tilt in w/c with safety belt on  and mom present.   Therapy Documentation Precautions:  Precautions Precautions: Fall Precaution Comments: hemicraniectomy placement of bone flap abdominal pocket Required Braces or Orthoses: Other Brace/Splint Other Brace/Splint: Helmet when OOB Restrictions Weight Bearing Restrictions: No Pain:    No/ denies pain  See Function Navigator for Current Functional Status.   Therapy/Group: Individual Therapy  Teddy Pena L 08/16/2017, 7:17 AM

## 2017-08-16 NOTE — Progress Notes (Signed)
Speech Language Pathology Daily Session Note  Patient Details  Name: Jeremy [name] MRN: [id number] Date of Birth: 07-Dec-1975  Today's Date: 08/16/2017 SLP Individual Time: 0830-0930 SLP Individual Time Calculation (min): 60 min  Short Term Goals: Week 2: SLP Short Term Goal 1 (Week 2): Pt will consume trials of ice chips with overt s/s of aspiration to demonstrate readiness for repeat instrumental study.  SLP Short Term Goal 2 (Week 2): Pt will expand utterance to sentence level with Mod A cues.  SLP Short Term Goal 3 (Week 2): Pt will complete basic familiar tasks with Min A cues.  SLP Short Term Goal 4 (Week 2): Pt will demonstrate task initiation with Min A cues in 80% of opportunities.  SLP Short Term Goal 5 (Week 2): Pt will sustain attention to task for ~ 20 minutes with Mod A cues.  SLP Short Term Goal 6 (Week 2): Pt will demonstrate intellectual awarness by answering yes/no questions related to physical and cognitive deficits with 75% accuracy and Mod A cues.   Skilled Therapeutic Interventions: Skilled treatment session focused on dysphagia and cognition goals. SLP received pt in bed and he was able to follow directions with supervision cues to transfer to wheelchair (PT available to assist with transfer). Pt received a score of 20 out of 22 on MOCA Blind (n=> 18).  Pt's score within functional limits and education provided to pt on score and implications that his cognition isn't as impaired as it appears at bedside. Pt able to demonstrate intellectual awareness with Mod A cues and fluent speech. Pt's personality often interferes with his ability to demonstrate function. Per MD request, will evaluate for possibility of aphasia given location of CVA (however pt doesn't exhibit any s/s of aphasia). SLP facilitated session by providing skilled observation of pt consuming thin liquids via cups sips (small and large boluses). Pt with no overt s/s of aspiration - however he was a silent aspirator on  last MBS. Recommend another MBS to rule out silent aspiration on 08/16/17. Pt was returned to room, left upright in wheelchair with all needs within reach. Continue per current plan of care.      Function:  Eating Eating   Modified Consistency Diet: No(Trials of water with SLP)             Cognition Comprehension Comprehension assist level: Follows basic conversation/direction with no assist  Expression   Expression assist level: Expresses basic needs/ideas: With extra time/assistive device  Social Interaction Social Interaction assist level: Interacts appropriately 75 - 89% of the time - Needs redirection for appropriate language or to initiate interaction.;Interacts appropriately 50 - 74% of the time - May be physically or verbally inappropriate.  Problem Solving Problem solving assist level: Solves basic problems with no assist  Memory Memory assist level: Recognizes or recalls 75 - 89% of the time/requires cueing 10 - 24% of the time    Pain    Therapy/Group: Individual Therapy  Dwana Garin 08/16/2017, 12:20 PM

## 2017-08-17 ENCOUNTER — Inpatient Hospital Stay (HOSPITAL_COMMUNITY): Payer: BLUE CROSS/BLUE SHIELD | Admitting: Occupational Therapy

## 2017-08-17 ENCOUNTER — Ambulatory Visit (HOSPITAL_COMMUNITY): Payer: BLUE CROSS/BLUE SHIELD | Admitting: Speech Pathology

## 2017-08-17 ENCOUNTER — Inpatient Hospital Stay (HOSPITAL_COMMUNITY): Payer: BLUE CROSS/BLUE SHIELD

## 2017-08-17 ENCOUNTER — Inpatient Hospital Stay (HOSPITAL_COMMUNITY): Payer: BLUE CROSS/BLUE SHIELD | Admitting: Physical Therapy

## 2017-08-17 ENCOUNTER — Encounter (HOSPITAL_COMMUNITY): Payer: BLUE CROSS/BLUE SHIELD | Admitting: Speech Pathology

## 2017-08-17 NOTE — Progress Notes (Signed)
Physical Therapy Session Note  Patient Details  Name: Jeremy [name] MRN: [id number] Date of Birth: [birth year]  Today's Date: 08/17/2017 PT Individual Time: 1120-1200 PT Individual Time Calculation (min): 40 min   Short Term Goals: Week 2:  PT Short Term Goal 1 (Week 2): Pt will propell WC 59f with max assist.  PT Short Term Goal 2 (Week 2): Pt will sit EOB with mod assist x 2 minutes.  PT Short Term Goal 3 (Week 2): Pt will perform bed mobility with mod assist  PT Short Term Goal 4 (Week 2): [facility] 50% of the time  Skilled Therapeutic Interventions/Progress Updates:   Pt received supine in bed and agreeable to PT. Supine>sit transfer with max assist and max cues. Modified squat pivot [facility] to [facility]. Pt transported to rehab gym in [place] Squat pivot to mat table with max assist. Sitting balance EOM x 10 minutes with max assist; up to 30 sec with supervision assist before LOB to the R.   [facility]   PT instructed pt in WC mobility with L hemi technique x 353fwith mod assist. Instruction for use of LLE only and able to propel WC 1006fith supervision assist.   Kinetron reciprocal movement training 4 bouts x 30sec at 20cm/sec. Pt able to initiate push through the RLE 75% of the time.   Squat pivot transfer to TIS WC with max assist. Patient returned to room and left sitting in WC [facility] in reach and all needs met.          Therapy Documentation Precautions:  Precautions Precautions: Fall Precaution Comments: hemicraniectomy placement of bone flap abdominal pocket Required Braces or Orthoses: Other Brace/Splint Other Brace/Splint: Helmet when OOB Restrictions Weight Bearing Restrictions: No Pain:   denies   See Function Navigator for Current Functional Status.   Therapy/Group: Individual Therapy  AusLorie Phenix1/2019, 12:47 PM

## 2017-08-17 NOTE — Progress Notes (Signed)
Speech Language Pathology Daily Session Note  Patient Details  Name: Jeremy [name] MRN: [id number] Date of Birth: [birth year]  Today's Date: 08/18/2017   Skilled treatment session #1 SLP Individual Time: 0900-0910 SLP Individual Time Calculation (min): 10    Skilled treatment session #2 SLP Individual Time: 0950-1000 SLP Individual Time Calculation (min): 10 min  Skilled treatment session #3 SLP Individual Time: 1300-1400 SLP Individual Time Calculation (min): 60 min  Short Term Goals: Week 2: SLP Short Term Goal 1 (Week 2): Pt will consume dysphagia 3 with thin liquids without overt s/s of aspiration. SLP Short Term Goal 1 - Progress (Week 2): Updated due to goal met SLP Short Term Goal 2 (Week 2): Given Max A cues, pt will scan to right of midline to locate objects during ADL tasks.  SLP Short Term Goal 2 - Progress (Week 2): Updated due to goal met SLP Short Term Goal 3 (Week 2): Pt will complete semi-complex tasks with Min A cues.  SLP Short Term Goal 3 - Progress (Week 2): Updated due to goal met SLP Short Term Goal 4 (Week 2): Pt will demonstrate task initiation with Min A cues in 80% of opportunities.  SLP Short Term Goal 5 (Week 2): Pt will demonstrate selective attention in moderately distracting environment for ~ 45 minutes with MIn A cues.  SLP Short Term Goal 5 - Progress (Week 2): Updated due to goal met SLP Short Term Goal 6 (Week 2): Pt will demonstrate intellectual awareness by identifying 2 physical and 2 cognitive deficits with Min A cues.  SLP Short Term Goal 6 - Progress (Week 2): Updated due to goal met  Skilled Therapeutic Interventions:  Skilled treatment session #1 focused on completion of Modified Barium Swallow Study. For complete details see report in notes section.  Skilled treatment session #2 focused on completion of family education regarding results of Modified Barium Swallow Study and current diet recommendation and plan for observed meal at 1300. Mom and  wife voiced understanding.   Skilled treatment session #3 focused on dysphagia, education and evaluation of possible aphasia (per MD request given location of CVA - left MCA). Pt, wife and mother went to dayroom and SLP provided skilled observation of pt consuming dysphagia 3 lunch tray with thin liquids via cup. After set-up, pt independently fed himself with lefthand at incredibly fast rate. When cued to slow rate by mother, wife or SLP, pt would verbally argue (that he didn't need to slow down) but he argued with food in his mouth and cough (indication of inability to control bolus orally and protect airway). When allowed to consume at high rate, pt demonstrated adequate oropharyngeal abilities to consume dysphagia 3 with thin liquids via cup without overt s/s of aspiration and good airway protection. Recommend that pt be allowed to consume without cues to alter rate.   SLP further facilitated session by administering the Western Aphasia Battery. Given location of pt's CVA (left MCA) as well as fact that pt seldom speaks/interacts with MD. During ST sessions pt doesn't present with any evidence of aphasia and he states that he "purposefully doesn't want to talk to the MD." Pt with no errors on the Western Aphasia Battery. All language abilities are within normal limits. During session pt presents with defiant choice to provide wrong or incorrect answers. For example when given the statement "Jeremy [name] is _____" with pt to complete phrase. He responded "green" with laughter and smile. During ST sessions, this behavior is ignored by SLP and pt  immediately changes answer to "white."   Education provided to the family on the following:  Pt demonstrates functional cognitive abilities as evidenced by score of  20 out of 22 on MOCA Blind (n=>18) (administer 1/13) Pt demonstrates functional expressive and receptive language abilities as evidenced by no errors on Western Aphasia battery Reviewed location of pt's CVA  and reported baseline personality of defiance/agrumentativeness/inappropriate often mean comments Family witnessed pt's choice to provide incorrect answers by choice and pt doesn't exhibit any motivating factors (external or internal) - including his mother and wife crying during session d/t to his rude/unfiltered comments to them both as well as his choice to provide incorrect answers. As education was provided to family pt with multiple hurtful comments to wife and mother - he frequently waved hand at wife and mother and stated he didn't care if they left and he wanted a good "skilled facility" and laughed. Progress is limited by these behaviors as pt choices not to display ability. I further recommended that wife and mother speak with [employer] about strategies when interacting with pt. I shared that during ST sessions, pt's behaviors/comments/refusal are ignored and pt's behaviors have decreased during session perhaps because they weren't effective. Pt is very aware that this SLP will not acknowledge any comments/behaviors that are not conducive to targeted task.     Function:  Eating Eating   Modified Consistency Diet: Yes Eating Assist Level: Set up assist for   Eating Set Up Assist For: Opening containers       Cognition Comprehension Comprehension assist level: Follows basic conversation/direction with no assist  Expression   Expression assist level: Expresses basic needs/ideas: With extra time/assistive device  Social Interaction Social Interaction assist level: Interacts appropriately 50 - 74% of the time - May be physically or verbally inappropriate.  Problem Solving Problem solving assist level: Solves basic problems with no assist;Solves complex 90% of the time/cues < 10% of the time  Memory Memory assist level: Recognizes or recalls 90% of the time/requires cueing < 10% of the time    Pain    Therapy/Group: Individual Therapy  Jeremy [name] 08/18/2017, 7:07 AM
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
## 2017-08-17 NOTE — Progress Notes (Signed)
Modified Barium Swallow Progress Note  Patient Details  Name: Jeremy [name] MRN: [id number] Date of Birth: 1976-01-31  Today's Date: 08/17/2017  Modified Barium Swallow completed.  Full report located under Chart Review in the Imaging Section.  Brief recommendations include the following:  Clinical Impression  Pt presents with much improved oropharyngeal abilities. At this time pt presents with sensed aspiration x 2 with thin liquids when attempting to talk or manipulate barium pill. Pt with extensive trials of thin liquids via cup including large boluses, consecutive boluses and mixed textures without any aspiration. Pt with ineffective labial seal for functional use of straw. Pt iwth ineffective manipulation of barium tablet and eventually spit it out. Pt states that at baseline, he never took medicine whole (mother later confirmed). Recommend upgrading pt's diet to dysphagia 3 with thin liquids via cup, full nursing supervision and medicine crushed in puree.    Swallow Evaluation Recommendations       SLP Diet Recommendations: Dysphagia 3 (Mech soft) solids;Thin liquid   Liquid Administration via: Cup;No straw   Medication Administration: Crushed with puree   Supervision: Patient able to self feed;Full supervision/cueing for compensatory strategies   Compensations: Minimize environmental distractions;Slow rate;Small sips/bites;Lingual sweep for clearance of pocketing   Postural Changes: Seated upright at 90 degrees(Out of bed)   Oral Care Recommendations: Oral care BID        Jeremy [name] 08/17/2017,3:56 PM

## 2017-08-17 NOTE — Progress Notes (Signed)
Occupational Therapy Session Note  Patient Details  Name: Jeremy [name] MRN: [id number] Date of Birth: January 20, 1976  Today's Date: 08/17/2017 OT Individual Time: 1415-1530 OT Individual Time Calculation (min): 75 min    Short Term Goals: Week 2:  OT Short Term Goal 1 (Week 2): Pt will complete transfer with assist +1 in order to decrease caregiver burden OT Short Term Goal 2 (Week 2): Pt will maintain sitting balance EOB with min A OT Short Term Goal 3 (Week 2): Pt will display improved righting reactions, able to self correct LOB with min cuing  Skilled Therapeutic Interventions/Progress Updates:    Pt presented sitting up in w/c, no c/o pain. Pt's mother present and providing clothing for ADL completion. Pt completes UB dressing with total assist to doff shirt and ModA to don clean overhead shirt, MaxA for LB dressing to thread LEs and assist to advance pants over hips; Pt doffing shoes prior to LB dressing with therapist supporting RLE in figure 4 position to do so. Pt requires ModA for sit<>stand and MaxA (+2 for safety) to maintain standing balance to advance pants over hips. Pt requires MAX encouragement to complete all activity. Total assist for re-donning shoes for time management. Pt completes grooming ADLs with setup assist; Pt often leaning to R during ADL completion, requires MinA and verbal cues to correct/maintain midline. Transported Pt to therapy gym; Pt engaging in dynamic sitting activity of horseshoes and card matching, reaching outside BOS to obtain both horseshoes and cards as well as to place cards. Pt requiring minA to maintain sitting balance. Returned to room in manner described above. Completed PROM to RUE including forward extension (0 to <90*), internal/ext rotation to neutral, forearm pronation/supination, elbow, wrist and digit flex/ext. Pt left semi-reclined in TIS w/c, QRB donned, call bell and needs within reach, visitor present.   Therapy Documentation Precautions:   Precautions Precautions: Fall Precaution Comments: hemicraniectomy placement of bone flap abdominal pocket Required Braces or Orthoses: Other Brace/Splint Other Brace/Splint: Helmet when OOB Restrictions Weight Bearing Restrictions: No       See Function Navigator for Current Functional Status.   Therapy/Group: Individual Therapy  Orlando PennerBreanna L Shawntay Prest 08/17/2017, 4:53 PM

## 2017-08-18 ENCOUNTER — Inpatient Hospital Stay (HOSPITAL_COMMUNITY): Payer: BLUE CROSS/BLUE SHIELD | Admitting: Occupational Therapy

## 2017-08-18 MED ORDER — METHYLPHENIDATE HCL 5 MG PO TABS
5.0000 mg | ORAL_TABLET | Freq: Two times a day (BID) | ORAL | Status: DC
  Administered 2017-08-19 – 2017-09-02 (×30): 5 mg via ORAL
  Filled 2017-08-18 (×30): qty 1

## 2017-08-18 MED ORDER — ESCITALOPRAM OXALATE 10 MG PO TABS
10.0000 mg | ORAL_TABLET | Freq: Every day | ORAL | Status: DC
  Administered 2017-08-18 – 2017-09-07 (×21): 10 mg via ORAL
  Filled 2017-08-18 (×22): qty 1

## 2017-08-18 NOTE — Progress Notes (Signed)
Occupational Therapy Session Note  Patient Details  Name: Jeremy [name] MRN: [id number] Date of Birth: [birth year]  Today's Date: 08/18/2017 OT Individual Time: 1300-1400 OT Individual Time Calculation (min): 60 min    Short Term Goals: Week 2:  OT Short Term Goal 1 (Week 2): Pt will complete transfer with assist +1 in order to decrease caregiver burden OT Short Term Goal 2 (Week 2): Pt will maintain sitting balance EOB with min A OT Short Term Goal 3 (Week 2): Pt will display improved righting reactions, able to self correct LOB with min cuing  Skilled Therapeutic Interventions/Progress Updates:    Treatment session focused on ADLs/self care training, transfer training, balance training, NMR, and pt education. Pt upright in w/c with mother present. Required mod-max v encouragement for tasks. Completed sink side ADLs at w/c level with mod A for R UE facilitation for d/b. Pt required max A for LB dressing, and +2 for sit<>stand tx for at sink. Tolerated upright standing at sink for ~2 minutes x 2 for WB in R LE/UE and weight shift. Pt completed static and dynamic balance control activities in lateral leans and modified crunches. V/c and t/c for posture with use of mirror for visualization. Pt returned to room with needs met with mother to visit.   Therapy Documentation Precautions:  Precautions Precautions: Fall Precaution Comments: hemicraniectomy placement of bone flap abdominal pocket Required Braces or Orthoses: Other Brace/Splint Other Brace/Splint: Helmet when OOB Restrictions Weight Bearing Restrictions: No Vital Signs: Therapy Vitals Temp: (!) 97.4 F (36.3 C) Temp Source: Oral Pulse Rate: 89 Resp: 18 BP: 121/85 Patient Position (if appropriate): Sitting Oxygen Therapy SpO2: 99 % O2 Device: Not Delivered Pain: Pain Assessment Pain Assessment: No/denies pain   See Function Navigator for Current Functional Status.   Therapy/Group: Individual Therapy  Delon Sacramento 08/18/2017, 3:17 PM
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
## 2017-08-19 NOTE — Plan of Care (Signed)
  Progressing RH BOWEL ELIMINATION RH STG MANAGE BOWEL WITH ASSISTANCE Description STG Manage Bowel with mod Assistance.  08/19/2017 0603 - Progressing by Martina SinnerMurray, Frank Novelo A, RN RH STG MANAGE BOWEL W/MEDICATION W/ASSISTANCE Description STG Manage Bowel with Medication and min Assistance.  08/19/2017 0603 - Progressing by Martina SinnerMurray, Macaila Tahir A, RN RH BLADDER ELIMINATION RH STG MANAGE BLADDER WITH ASSISTANCE Description STG Manage Bladder With mod Assistance  08/19/2017 0603 - Progressing by Martina SinnerMurray, Laterra Lubinski A, RN RH SKIN INTEGRITY RH STG SKIN FREE OF INFECTION/BREAKDOWN Description Skin to remain free from infection and breakdown while on rehab with max assist.  08/19/2017 0603 - Progressing by Martina SinnerMurray, Fayez Sturgell A, RN RH STG MAINTAIN SKIN INTEGRITY WITH ASSISTANCE Description STG Maintain Skin Integrity With max Assistance.  08/19/2017 0603 - Progressing by Martina SinnerMurray, Lucky Alverson A, RN RH STG ABLE TO PERFORM INCISION/WOUND CARE W/ASSISTANCE Description STG Able To Perform Incision or Wound Care With total Assistance.  08/19/2017 0603 - Progressing by Martina SinnerMurray, Andreas Sobolewski A, RN RH SAFETY RH STG ADHERE TO SAFETY PRECAUTIONS W/ASSISTANCE/DEVICE Description STG Adhere to Safety Precautions With max Assistance and appropriate assistive Device.  08/19/2017 0603 - Progressing by Martina SinnerMurray, Sharita Bienaime A, RN RH STG DECREASED RISK OF FALL WITH ASSISTANCE Description STG Decreased Risk of Fall With mod Assistance.  08/19/2017 0603 - Progressing by Martina SinnerMurray, Kady Toothaker A, RN RH COGNITION-NURSING RH STG USES MEMORY AIDS/STRATEGIES W/ASSIST TO PROBLEM SOLVE Description STG Uses Memory Aids and Strategies With min Assistance to Problem Solve.  08/19/2017 0603 - Progressing by Martina SinnerMurray, Machi Whittaker A, RN RH STG ANTICIPATES NEEDS/CALLS FOR ASSIST W/ASSIST/CUES Description STG Anticipates Needs and Calls for Assist With min Assistance and min Cues.  08/19/2017 0603 - Progressing by Martina SinnerMurray, Emileo Semel A, RN RH PAIN MANAGEMENT RH STG PAIN MANAGED AT OR BELOW PT'S PAIN  GOAL Description <3 on a 0-10 pain scale  08/19/2017 0603 - Progressing by Martina SinnerMurray, Zaydah Nawabi A, RN

## 2017-08-20 ENCOUNTER — Inpatient Hospital Stay (HOSPITAL_COMMUNITY): Payer: BLUE CROSS/BLUE SHIELD

## 2017-08-20 ENCOUNTER — Inpatient Hospital Stay (HOSPITAL_COMMUNITY): Payer: BLUE CROSS/BLUE SHIELD | Admitting: Speech Pathology

## 2017-08-20 ENCOUNTER — Inpatient Hospital Stay (HOSPITAL_COMMUNITY): Payer: BLUE CROSS/BLUE SHIELD | Admitting: Occupational Therapy

## 2017-08-20 NOTE — Progress Notes (Signed)
Speech Language Pathology Daily Session Note  Patient Details  Name: Jamisen Duerson MRN: [id number] Date of Birth: [birth year]  Today's Date: 08/20/2017 SLP Individual Time: 1330-1415 SLP Individual Time Calculation (min): 45 min  Short Term Goals: Week 2: SLP Short Term Goal 1 (Week 2): Pt will consume dysphagia 3 with thin liquids without overt s/s of aspiration. SLP Short Term Goal 1 - Progress (Week 2): Updated due to goal met SLP Short Term Goal 2 (Week 2): Given Max A cues, pt will scan to right of midline to locate objects during ADL tasks.  SLP Short Term Goal 2 - Progress (Week 2): Updated due to goal met SLP Short Term Goal 3 (Week 2): Pt will complete semi-complex tasks with Min A cues.  SLP Short Term Goal 3 - Progress (Week 2): Updated due to goal met SLP Short Term Goal 4 (Week 2): Pt will demonstrate task initiation with Min A cues in 80% of opportunities.  SLP Short Term Goal 5 (Week 2): Pt will demonstrate selective attention in moderately distracting environment for ~ 45 minutes with MIn A cues.  SLP Short Term Goal 5 - Progress (Week 2): Updated due to goal met SLP Short Term Goal 6 (Week 2): Pt will demonstrate intellectual awareness by identifying 2 physical and 2 cognitive deficits with Min A cues.  SLP Short Term Goal 6 - Progress (Week 2): Updated due to goal met  Skilled Therapeutic Interventions: Skilled treatment session focused on cognition goals. SLP facilitated session by providing planned failure opportunities during standing from wheelchair and pivoting to bed. Additional staff member present to provided support if needed. During physical movements (leaning to right when SLP decreased support) pt is able to identify deficits and potential ramifications of physical deficits. Pt unable to state without movement involved in planned failure. Pt was Mod I in demonstrating selective attention in moderately distracting environment. Pt able to create list of "things people who  have CVA might be frustrated with" - list left in room for visit with neuropysch on 2/5. Pt with improved participation in this session. Mother present. Pt was left in room, upright in wheelchair with safety belt on and all needs within reach.      Function:  Eating Eating                 Cognition Comprehension Comprehension assist level: Understands complex 90% of the time/cues 10% of the time  Expression   Expression assist level: Expresses basic needs/ideas: With no assist  Social Interaction Social Interaction assist level: Interacts appropriately 50 - 74% of the time - May be physically or verbally inappropriate.  Problem Solving Problem solving assist level: Solves basic problems with no assist;Solves complex 90% of the time/cues < 10% of the time  Memory Memory assist level: Complete Independence: No helper;More than reasonable amount of time    Pain    Therapy/Group: Individual Therapy  Cherylee Rawlinson 08/20/2017, 3:53 PM

## 2017-08-20 NOTE — Progress Notes (Signed)
Physical Therapy Session Note  Patient Details  Name: Jeremy [name] MRN: [id number] Date of Birth: [birth year]  Today's Date: 08/20/2017 PT Individual Time: 0930-1030 PT Individual Time Calculation (min): 60 min   Short Term Goals: Week 2:  PT Short Term Goal 1 (Week 2): Pt will propell WC 3350ft with max assist.  PT Short Term Goal 2 (Week 2): Pt will sit EOB with mod assist x 2 minutes.  PT Short Term Goal 3 (Week 2): Pt will perform bed mobility with mod assist  PT Short Term Goal 4 (Week 2): [facility] assist 50% of the time  Skilled Therapeutic Interventions/Progress Updates:    Session focused on functional participation (pt continues to be defiant throughout session requiring redirection and encouragement from both therapist and pt's mother), w/c mobility training with focus on attention and awareness to R as well as engaging in mobility (up to max assist needed due to pt not willing to follow cues by therapist and allowing self to go into the wall) and NMR during bed mobility, sitting balance activities, sit <> stands, gait, and Nustep to address postural control, attention to R/reorientation to midline, and motor activation in RLE. Pt requiring mod assist for coming to EOB with multimodal cues for technique and total assist for management of RUE/RLE. Min to max assist for functional sitting balance while shoes and helmet donned (total assist) as pt with poor protective responses when LOB occurs. Max assist for stand pivot transfers throughout session with focus on technique and graded movement with multimodal cues provided. Mod to max for sit <> stands at rail with mirror for visual feedback for postural control and engaged in gait training x 2 reps of 30' each with max assist for advancement of RLE and providing stability on R during stance phase and min to max assist for trunk control (pt able to control it when he wanted to, but on several occasions purposefully fighting  against therapist because he did not want to continue or start activity). Behavior addressed throughout session due to safety. Nustep for reciprocal movement pattern retraining with PT assisting with positioning of RLE to maintain neutral and for pt to attempt to activate into extension x 4 min.   Therapy Documentation Precautions:  Precautions Precautions: Fall Precaution Comments: hemicraniectomy placement of bone flap abdominal pocket Required Braces or Orthoses: Other Brace/Splint Other Brace/Splint: Helmet when OOB Restrictions Weight Bearing Restrictions: No  Pain: Pain Assessment Pain Assessment: No/denies pain Pain Score: 0-No pain   See Function Navigator for Current Functional Status.   Therapy/Group: Individual Therapy  Jeremy [name], Jeremy [name]  Jeremy [name], PT, DPT  08/20/2017, 11:11 AM
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
## 2017-08-20 NOTE — Progress Notes (Signed)
Occupational Therapy Session Note  Patient Details  Name: Jeremy [name] MRN: [id number] Date of Birth: 02-Sep-1975  Today's Date: 08/20/2017 OT Individual Time: 1035-1200 OT Individual Time Calculation (min): 85 min    Short Term Goals: Week 2:  OT Short Term Goal 1 (Week 2): Pt will complete transfer with assist +1 in order to decrease caregiver burden OT Short Term Goal 2 (Week 2): Pt will maintain sitting balance EOB with min A OT Short Term Goal 3 (Week 2): Pt will display improved righting reactions, able to self correct LOB with min cuing  Skilled Therapeutic Interventions/Progress Updates:    Pt seen for OT session focusing on ADL re-training, functional sitting balance, education and participation. Pt sitting up in w/c upon arrival, initially refusing therapy. Provided extensive education to pt regarding importance of participation, pt/family goals, etc. Pt remains passive and verbally refusing all attempts at therapy. Offered pt to choose what to work on and he refused. Declined shower, toileting, bed transfers, standing etc.  Pt's mom present and insistent on pt wearing real underwear vs. Hospital brief. With encouragement and time, pt able to doff/don clothing on L side of body while therapist assisted on R. He stood at sink x3 trials with min A and min progressing to mod A standing balance at sink and pt able to assist with pulling pants up.  Pt verbalized he would play connect 4, played from w/c level with objects placed for emphasis on attention to R. In therapy gym, pt completed min A squat pivot transfer when transferring to strong side. He sat EOM ~30 minutes with R UE placed in weightbearing position on textured mat for increased proprioceptive input. Pt able to maintain static sitting balance while playing card game at overall supervision level. He had x3 LOB episodes and qith cuing and tactile cues able to return to midline. While seated EOM, played UNO card game with therapist, mom,  and rehab tech. Pt able to read directions from box and required min questioning cues for mental flexibility/ problem solving during game.  Pt returned to w/c in same manner as described above. Taken back to room and left with all needs in reach and mother present.   Therapy Documentation Precautions:  Precautions Precautions: Fall Precaution Comments: hemicraniectomy placement of bone flap abdominal pocket Required Braces or Orthoses: Other Brace/Splint Other Brace/Splint: Helmet when OOB Restrictions Weight Bearing Restrictions: No Pain:     No/ denies pain  See Function Navigator for Current Functional Status.   Therapy/Group: Individual Therapy  Zaeden Lastinger L 08/20/2017, 7:08 AM

## 2017-08-21 ENCOUNTER — Inpatient Hospital Stay (HOSPITAL_COMMUNITY): Payer: BLUE CROSS/BLUE SHIELD | Admitting: Physical Therapy

## 2017-08-21 ENCOUNTER — Inpatient Hospital Stay (HOSPITAL_COMMUNITY): Payer: BLUE CROSS/BLUE SHIELD | Admitting: Occupational Therapy

## 2017-08-21 ENCOUNTER — Encounter (HOSPITAL_COMMUNITY): Payer: BLUE CROSS/BLUE SHIELD | Admitting: Psychology

## 2017-08-21 ENCOUNTER — Inpatient Hospital Stay (HOSPITAL_COMMUNITY): Payer: BLUE CROSS/BLUE SHIELD

## 2017-08-21 LAB — CREATININE, SERUM
Creatinine, Ser: 0.89 mg/dL (ref 0.61–1.24)
GFR calc Af Amer: >60 mL/min (ref >60)

## 2017-08-21 MED ORDER — POLYETHYLENE GLYCOL 3350 17 G PO PACK
17.0000 g | PACK | Freq: Every day | ORAL | Status: DC
  Administered 2017-08-21 – 2017-09-01 (×3): 17 g via ORAL
  Filled 2017-08-21 (×12): qty 1

## 2017-08-21 NOTE — Progress Notes (Signed)
Occupational Therapy Session Note  Patient Details  Name: Jeremy [name] MRN: [id number] Date of Birth: 06/20/1976  Today's Date: 08/21/2017 OT Individual Time: 0930-1100 OT Individual Time Calculation (min): 90 min    Short Term Goals: Week 2:  OT Short Term Goal 1 (Week 2): Pt will complete transfer with assist +1 in order to decrease caregiver burden OT Short Term Goal 2 (Week 2): Pt will maintain sitting balance EOB with min A OT Short Term Goal 3 (Week 2): Pt will display improved righting reactions, able to self correct LOB with min cuing  Skilled Therapeutic Interventions/Progress Updates:    Pt seen for OT ADL bathing/dressing session. Pt in supine upon arrival, with encouragement agreeable to tx session and plan for shower. He transferred to EOB using hospital bed functions with assist for management of R LE and VCs for technique. Completed min A squat pivot transfer into standard w/c.  For safety due to pt's inconsistency with transferr, used STEDY for transfer into shower. He bathed seated on BSC in shower, 1 LOB episode to R during dynamic sitting tasks, much improved from previous sessions. He bathed with assist to wash feet only. He returned to standard w/c to dress, able to recall hemi dressing techniques, therapist assisted with threading clothing on R side and pt completed on L. Pt needed min-mod cuing for participation/ independence, an actual improvement from previous sessions. He stood at sink with min A to power up and mod A overall for static/dynamic sstanding balance to pull up pants. Grooming tasks completed from standing position in order to increased functional standing balance and endurance. In hallway, practiced w/c propulsion using hemi technique, completed at overall supervision level with decreased rate of speed, and requiring min-mod cuing for awareness of environmental obstacles on R and cuing to scan entirely to R side. Education and demonstration provided regarding use  of tub bench at d/c. At this time, not safe to complete but reviewed DME and current recommendations. Pt's mom to take pictures of home bathroom set-up for future practice. Pt returned to room and completed min A squat pivot transfer to recliner, positioned with pillows to midline. Pt left in recliner with mother present.   Therapy Documentation Precautions:  Precautions Precautions: Fall Precaution Comments: hemicraniectomy placement of bone flap abdominal pocket Required Braces or Orthoses: Other Brace/Splint Other Brace/Splint: Helmet when OOB Restrictions Weight Bearing Restrictions: No Pain:    No/denies pain  See Function Navigator for Current Functional Status.   Therapy/Group: Individual Therapy  Bayle Calvo L 08/21/2017, 7:10 AM
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
## 2017-08-21 NOTE — Progress Notes (Signed)
Physical Therapy Session Note  Patient Details  Name: Jeremy [name] MRN: [id number] Date of Birth: June 25, 1976  Today's Date: 08/21/2017 PT Individual Time: 4799-8001 PT Individual Time Calculation (min): 55 min   Short Term Goals: Week 2:  PT Short Term Goal 1 (Week 2): Pt will propell WC 48f with max assist.  PT Short Term Goal 2 (Week 2): Pt will sit EOB with mod assist x 2 minutes.  PT Short Term Goal 3 (Week 2): Pt will perform bed mobility with mod assist  PT Short Term Goal 4 (Week 2): [facility] assist 50% of the time  Skilled Therapeutic Interventions/Progress Updates:   Pt in recliner and agreeable to therapy, requesting to toilet. Performed stedy transfer to/from toilet 2/2 urgency to have bowel movement. Performed multiple sit<>stands in stedy w/ min assist. Mod assist to manage LE garments using LUE, max assist standing balance while managing LE garments w/o UE support on stedy. Returned to w/c via stedy, pt unsuccessful attempt at toileting. Pt self-propelled w/c to/from gym w/ mod assist overall for cues and manual assist required for obstacle avoidance, used L hemi technique. Transferred to/from mat w/ max assist via stand pivot towards L side. Worked on dynamic sitting balance and postural control while at mat performing reaching and weight shifting tasks w/ emphasis on R postural control. Mod-max assist to correct multiple LOBs to R side. Practiced sit<>stands using L hemiwalker for UE support w/ min-mod assist depending on behavior. Max encouragement to participate towards of end of session. Min guard to maintain static standing balance, max assist to maintain balance w/o UE support in 5-10 sec bouts. Tactile cues for R quad activation in stance. Returned to room via w/c. Pt requesting to toilet again. Transferred to toilet via stedy and ended session on toilet and under supervision of wife and mother. All needs met. NT made aware that pt will be calling for  assistance off toilet soon.   Therapy Documentation Precautions:  Precautions Precautions: Fall Precaution Comments: hemicraniectomy placement of bone flap abdominal pocket Required Braces or Orthoses: Other Brace/Splint Other Brace/Splint: Helmet when OOB Restrictions Weight Bearing Restrictions: No  See Function Navigator for Current Functional Status.   Therapy/Group: Individual Therapy  Aleecia Tapia K Arnette 08/21/2017, 3:42 PM

## 2017-08-21 NOTE — Progress Notes (Signed)
Speech Language Pathology Daily Session Note  Patient Details  Name: Jeremy [name] MRN: [id number] Date of Birth: Nov 06, 1975  Today's Date: 08/21/2017 SLP Individual Time: 0800-0900 SLP Individual Time Calculation (min): 60 min  Short Term Goals: Week 2: SLP Short Term Goal 1 (Week 2): Pt will consume dysphagia 3 with thin liquids without overt s/s of aspiration. SLP Short Term Goal 1 - Progress (Week 2): Updated due to goal met SLP Short Term Goal 2 (Week 2): Given Max A cues, pt will scan to right of midline to locate objects during ADL tasks.  SLP Short Term Goal 2 - Progress (Week 2): Updated due to goal met SLP Short Term Goal 3 (Week 2): Pt will complete semi-complex tasks with Min A cues.  SLP Short Term Goal 3 - Progress (Week 2): Updated due to goal met SLP Short Term Goal 4 (Week 2): Pt will demonstrate task initiation with Min A cues in 80% of opportunities.  SLP Short Term Goal 5 (Week 2): Pt will demonstrate selective attention in moderately distracting environment for ~ 45 minutes with MIn A cues.  SLP Short Term Goal 5 - Progress (Week 2): Updated due to goal met SLP Short Term Goal 6 (Week 2): Pt will demonstrate intellectual awareness by identifying 2 physical and 2 cognitive deficits with Min A cues.  SLP Short Term Goal 6 - Progress (Week 2): Updated due to goal met  Skilled Therapeutic Interventions: Skilled ST services focused on swallow and cognitive skills. SLP facilitated PO consumption of current diet, dys 3 and thin at breakfast, pt required Min A verbal cues to slow rate and Min A visual tracking cues to scan to right during meal to locate items. Pt demonstrated recall and problem solving skills in explaining and performing recently learned card game "Trash." SLP facilitated problem solving skills in novel card game " War" pt required mod fading to min A verbal cues and Mod I for task initiation. Pt required Mod A verbal cues to recall concern items on list for upcoming  session with phsycholgist. Pt demonstrated identification of 2 physical deficits with Mod I and required Mod A verbal cues to identify cognitive deficits. SLP educated about "Hope" book to continue education about recovery from CVA. Pt was left in bed with call bell within reach. Recommend to continue plan of care.     Function:  Eating Eating   Modified Consistency Diet: Yes(dys 3) Eating Assist Level: Set up assist for;Supervision or verbal cues   Eating Set Up Assist For: Opening containers       Cognition Comprehension Comprehension assist level: Understands complex 90% of the time/cues 10% of the time  Expression   Expression assist level: Expresses basic needs/ideas: With no assist  Social Interaction Social Interaction assist level: Interacts appropriately 50 - 74% of the time - May be physically or verbally inappropriate.  Problem Solving Problem solving assist level: Solves basic 75 - 89% of the time/requires cueing 10 - 24% of the time  Memory Memory assist level: Recognizes or recalls 75 - 89% of the time/requires cueing 10 - 24% of the time    Pain Pain Assessment Pain Assessment: No/denies pain  Therapy/Group: Individual Therapy  Lateria Alderman  [facility] 08/21/2017, 2:52 PM

## 2017-08-22 ENCOUNTER — Inpatient Hospital Stay (HOSPITAL_COMMUNITY): Payer: BLUE CROSS/BLUE SHIELD | Admitting: Physical Therapy

## 2017-08-22 ENCOUNTER — Inpatient Hospital Stay (HOSPITAL_COMMUNITY): Payer: BLUE CROSS/BLUE SHIELD | Admitting: Speech Pathology

## 2017-08-22 ENCOUNTER — Inpatient Hospital Stay (HOSPITAL_COMMUNITY): Payer: BLUE CROSS/BLUE SHIELD | Admitting: Occupational Therapy

## 2017-08-22 MED ORDER — TOPIRAMATE 25 MG PO TABS
25.0000 mg | ORAL_TABLET | Freq: Every day | ORAL | Status: DC
  Administered 2017-08-22: 25 mg via ORAL
  Filled 2017-08-22: qty 1

## 2017-08-22 NOTE — Progress Notes (Signed)
Speech Language Pathology Weekly Progress and Session Note  Patient Details  Name: Jeremy [name] MRN: [id number] Date of Birth: 1976/06/03  Beginning of progress report period: August 15, 2017 End of progress report period: August 22, 2017  Today's Date: 08/22/2017 SLP Individual Time: 1100-1200 SLP Individual Time Calculation (min): 60 min  Short Term Goals: Week 2: SLP Short Term Goal 1 (Week 2): Pt will consume dysphagia 3 with thin liquids without overt s/s of aspiration. SLP Short Term Goal 1 - Progress (Week 2): Met SLP Short Term Goal 2 (Week 2): Given Max A cues, pt will scan to right of midline to locate objects during ADL tasks.  SLP Short Term Goal 2 - Progress (Week 2): Met SLP Short Term Goal 3 (Week 2): Pt will complete semi-complex tasks with Min A cues.  SLP Short Term Goal 3 - Progress (Week 2): Met SLP Short Term Goal 4 (Week 2): Pt will demonstrate task initiation with Min A cues in 80% of opportunities.  SLP Short Term Goal 4 - Progress (Week 2): Not met SLP Short Term Goal 5 (Week 2): Pt will demonstrate selective attention in moderately distracting environment for ~ 45 minutes with MIn A cues.  SLP Short Term Goal 5 - Progress (Week 2): Not met SLP Short Term Goal 6 (Week 2): Pt will demonstrate intellectual awareness by identifying 2 physical and 2 cognitive deficits with Min A cues.  SLP Short Term Goal 6 - Progress (Week 2): Not met    New Short Term Goals: Week 3: SLP Short Term Goal 1 (Week 3): Pt will consume trials of regular textures without overt s/s of aspiration and supervision cuesto demonstrate readiness for diet upgrade. SLP Short Term Goal 2 (Week 3): Given Mod A cues, pt will scan to right of midline to locate objects during semi-complex tasks.  SLP Short Term Goal 3 (Week 3): Pt will complete semi-complex tasks with supervision cues.  SLP Short Term Goal 4 (Week 3): Pt will demonstrate task initiation with Min A cues in 80% of opportunities.  SLP  Short Term Goal 5 (Week 3): Pt will demonstrate intellectual awareness by identifying 2 physical and 2 cognitive deficits with Min A cues.  SLP Short Term Goal 6 (Week 3): Pt will demonstrate selective attention in moderately distracting environment for ~ 45 minutes with MIn A cues.   Weekly Progress Updates:     Intensity: Minumum of 1-2 x/day, 30 to 90 minutes Frequency: 3 to 5 out of 7 days Duration/Length of Stay: 2/19 Treatment/Interventions: Cognitive remediation/compensation;Cueing hierarchy;Dysphagia/aspiration precaution training;Functional tasks;Patient/family education;Therapeutic Activities;Speech/Language facilitation;Multimodal communication approach   Daily Session  Skilled Therapeutic Interventions: Skilled treatment session focused on cognition goals. SLP received pt in recliner and facilitated transfer with Min A guard and pt able to verbally direct transfer with supervision cues. Pt required Mod A cues from his mother to instruct SLP in learning new card game (NERT). With Mod A from his mother, he was able to explain to SLP rules of game. Pt required Max A to Mod A cues to demonstrate attention to his cards located to his right. SLP kept track of number of misses that pt had with pt demonstrating some insight when projecting number of misses he might make.  At the end of game, pt able to demonstrate increase intellectual awareness/reflection on specific areas of deficits with game (Min A cues by SLP). Pt was returned to room,left upright in wheelchair with all needs within reach. Continue per current plan of care.  Function:     Cognition Comprehension Comprehension assist level: Understands complex 90% of the time/cues 10% of the time  Expression   Expression assist level: Expresses complex 90% of the time/cues < 10% of the time  Social Interaction Social Interaction assist level: Interacts appropriately 90% of the time - Needs monitoring or encouragement for  participation or interaction.  Problem Solving Problem solving assist level: Solves basic problems with no assist  Memory Memory assist level: Recognizes or recalls 75 - 89% of the time/requires cueing 10 - 24% of the time   General    Pain    Therapy/Group: Individual Therapy  Happi Overton 08/22/2017, 1:39 PM

## 2017-08-22 NOTE — Progress Notes (Signed)
Physical Therapy Session Note  Patient Details  Name: Ransom Nickson MRN: [id number] Date of Birth: 08/03/1975  Today's Date: 08/22/2017 PT Individual Time: 3276-1470 PT Individual Time Calculation (min): 71 min   Short Term Goals: Week 1:  PT Short Term Goal 1 (Week 1): Pt will transfers consistently with max assist of 1.  PT Short Term Goal 1 - Progress (Week 1): Progressing toward goal PT Short Term Goal 2 (Week 1): Pt will maintain static sitting balance with mod assist x 2 min  PT Short Term Goal 2 - Progress (Week 1): Not met PT Short Term Goal 3 (Week 1): Pt will initiate WC propulsion  PT Short Term Goal 3 - Progress (Week 1): Not met PT Short Term Goal 4 (Week 1): Pt will perform bed mobility with max assist 50% of the time.  PT Short Term Goal 4 - Progress (Week 1): Met PT Short Term Goal 5 (Week 1): Pt will initiate gait training.  PT Short Term Goal 5 - Progress (Week 1): Met Week 2:  PT Short Term Goal 1 (Week 2): Pt will propell WC 66f with max assist.  PT Short Term Goal 2 (Week 2): Pt will sit EOB with mod assist x 2 minutes.  PT Short Term Goal 3 (Week 2): Pt will perform bed mobility with mod assist  PT Short Term Goal 4 (Week 2): [facility]  Skilled Therapeutic Interventions/Progress Updates:    Pt received supine in bed and agreeable to PT. PT assisted pt to don socks and shoes in bed for time management.Supine>sit transfer with mod assist and moderate cues for improved use of the LUE. Pt noted to have mild flexor tone in the RLE.    WC mobility instructed by PT with supervision assist [id number] with moderate verbal cues for attention to the R.    Squat pivot transfers to the L and the R with mod assist progressing to MiMoccasinssist throughout treatment x 10 .    Sitting balance EOM 2 x 5 min with supervision assist with 1-0 UE support.    Sit<>stand with min-mod assist from PT for sit<>stand transfers x 4 Standing balance  With  HW 1 min x 4 with min assist throughout with attention to LOB to the R and activation the R quad.   Nustep reciprocal movement training [id number], level 5. Moderate-max cues gthroughout for activation og the RLE.   Gait training instructed by PT x [id number] nustep. Max assist with +2 for WC follow.   Patient returned to room and [facility] and all needs met.           Therapy Documentation Precautions:  Precautions Precautions: Fall Precaution Comments: hemicraniectomy placement of bone flap abdominal pocket Required Braces or Orthoses: Other Brace/Splint Other Brace/Splint: Helmet when OOB Restrictions Weight Bearing Restrictions: No Pain:   0/10   See Function Navigator for Current Functional Status.   Therapy/Group: Individual Therapy  AustLorie Phenix/2019, 9:16 AM

## 2017-08-22 NOTE — Patient Care Conference (Signed)
Inpatient RehabilitationTeam Conference and Plan of Care Update Date: 08/22/2017   Time: 10:50 AM    Patient Name: Jeremy [name]      Medical Record Number: [id number]  Date of Birth: 03/19/1976 Sex: Male         Room/Bed: 4W12C/4W12C-01 Payor Info: Payor: [place] / Plan: [employer] OTHER / Product Type: *No Product type* /    Admitting Diagnosis: Stroke CVA  Admit Date/Time:  08/07/2017  8:36 PM Admission Comments: No comment available   Primary Diagnosis:  <principal problem not specified> Principal Problem: <principal problem not specified>  Patient Active Problem List   Diagnosis Date Noted  . Frontal lobe and executive function deficit following cerebral infarction   . Adjustment disorder with mixed anxiety and depressed mood   . Left middle cerebral artery stroke ([place]) 08/07/2017  . Aphasia due to acute stroke ([place]) 08/07/2017  . B12 deficiency   . Acute respiratory failure ([place])   . Central line infiltration ([place])   . Endotracheal tube present   . Fever   . Leukocytosis   . S/P craniotomy 07/30/2017  . Cerebral edema ([facility]) 07/28/2017  . Hyperlipidemia 07/28/2017  . Stroke (cerebrum) ([place]) 07/27/2017    Expected Discharge Date: Expected Discharge Date: 09/04/17  Team Members Present: Physician leading conference: Dr. Alysia Penna Social Worker Present: Ovidio Kin, LCSW Nurse Present: Rayetta Humphrey, RN PT Present: Barrie Folk, PT OT Present: Napoleon Form, OT SLP Present: Stormy Fabian, SLP PPS Coordinator present : Daiva Nakayama, RN, CRRN     Current Status/Progress Goal Weekly Team Focus  Medical   more interactive Severe Right Hemi, eye pain is more HA related  full participation in therapy  Medtrial for attn, methylphenidate and lexapro   Bowel/Bladder   continent & incontinent, LBM 08/22/17, on bowel program but has been refusing suppository, took it this morning  continence  Continue to encourage & assist as needed   Swallow/Nutrition/ Hydration   dysphagia 3 with thin liquids  Supervision with least restrictive diet  completion of MBS, upgraded diet textures   ADL's   Varies greatly depending on motivation, can perform squat pivot with min A, max A standing balance, max A UB/LB dressing; total A toileting  Min A overall may need to downgrade pending pt progress  Functional transfers, ADL re-training, functional standing balance, neuro re-ed   Mobility   mod assist bed mobility. mod assist squat pivot transfers. supervision assist WC mobility with L hemi technique.    min assist trasnfers, supervision assist WC mobility, mod assist ambulation with LRAD   dynamic balance training, improved safety with WC mobility, improved indepedence with transfers and gait.    Communication   Supervision  Mod I - goal met  no language deficits   Safety/Cognition/ Behavioral Observations  Min A for right attention, anticipatory awareness  Min A to supervision  problem solving, attention, awareness   Pain   c/o occasional headaches  pain scale <4  assess & treat as needed   Skin   surgical incisions healed  no new areas  assess q shift      *See Care Plan and progress notes for long and short-term goals.     Barriers to Discharge  Current Status/Progress Possible Resolutions Date Resolved   Physician    Inaccessible home environment;Medical stability;Decreased caregiver support  developing tone in UE and LE  slow progress, using Nu step better  work on compensation for proprioceptive loss      Nursing  PT                    OT                  SLP                SW                Discharge Planning/Teaching Needs:  Mom and wife here daily and continuing to encourage and support pt. Pt seems to be participating more and finally realizes he needs to work to accomplish his goals. Neuro-psych continues to see for coping      Team Discussion:  Progressing in his therapies toward min assist level. Fixates at times on his  bowels. More motivated and participating more, realizes his need to push himself now. MD reports on ritalin and lexapro to assist with his mood. Upgraded to Dys 3 thin liquids. Mom here daily and has participated in therapies with pt.  Revisions to Treatment Plan:  DC 2/19  Diet upgraded to Dys 3 thin    Continued Need for Acute Rehabilitation Level of Care: The patient requires daily medical management by a physician with specialized training in physical medicine and rehabilitation for the following conditions: Daily direction of a multidisciplinary physical rehabilitation program to ensure safe treatment while eliciting the highest outcome that is of practical value to the patient.: Yes Daily medical management of patient stability for increased activity during participation in an intensive rehabilitation regime.: Yes Daily analysis of laboratory values and/or radiology reports with any subsequent need for medication adjustment of medical intervention for : Neurological problems;Other  Koal Eslinger, Gardiner Rhyme 08/22/2017, 1:01 PM

## 2017-08-22 NOTE — Progress Notes (Signed)
Occupational Therapy Weekly Progress Note  Patient Details  Name: Jeremy [name] MRN: [id number] Date of Birth: 10/28/1975  Beginning of progress report period: August 15, 2017 End of progress report period: August 22, 2017  Today's Date: 08/22/2017 OT Individual Time: 1300-1400 OT Individual Time Calculation (min): 60 min    Patient has met 2 of 3 short term goals.  Pt making slow but steady progress towards OT goals. Towards end of reporting period, pt becoming slightly more engaged in tx sessions though can still require max cuing for encouragement and min-mod cuing for inappropriate comments. He cont to be most limited by defiant behaviors as well as dense R hemiplegia. Pt's mother has been present throughout rehab admission, have intiiated hands on caregiver training and preparations for d/c.   Patient continues to demonstrate the following deficits: abnormal posture, acute pain, apraxia, ataxia, cognitive deficits, disturbance of vision, flaccid hemiplegia and hemiparesis, hemiplegia affecting non-dominant side and muscle weakness (generalized) and therefore will continue to benefit from skilled OT intervention to enhance overall performance with BADL and Reduce care partner burden.  Patient progressing toward long term goals..  Continue plan of care.  OT Short Term Goals Week 2:  OT Short Term Goal 1 (Week 2): Pt will complete transfer with assist +1 in order to decrease caregiver burden OT Short Term Goal 1 - Progress (Week 2): Met OT Short Term Goal 2 (Week 2): Pt will maintain sitting balance EOB with min A OT Short Term Goal 2 - Progress (Week 2): Met OT Short Term Goal 3 (Week 2): Pt will display improved righting reactions, able to self correct LOB with min cuing OT Short Term Goal 3 - Progress (Week 2): Progressing toward goal Week 3:  OT Short Term Goal 1 (Week 3): Pt will complete squat pivot transfer R<>L to toilet with min A OT Short Term Goal 2 (Week 3): Pt will maintain  static standing balance with L UE support during LB clothing management task (completed total A) in order to reduce caregiver burden OT Short Term Goal 3 (Week 3): Pt will sit EOB to complete dressing task with no more than mod A for dynamic sitting balance  Skilled Therapeutic Interventions/Progress Updates:    Pt seen for OT session focusing on functional transfers and caregiver training. Pt sitting up in w/c upon arrival with mother present, agreeable to tx session. Emphasis of session on toilet transfers. Pt's mother provided pictures of home bathroom layout and discussed DME options and grab bar placement in bathroom. Pt completed x3 toilet transfers throughout session including w/c <> toilet, w/c <> BSC over toilet and w/c <> drop arm BSC. Pt completed with min A when able to use grab bar to assist with transfer. Mod A initially progressing to min A for w/c <> drop arm BSC transfer with VCs for head/hip relationship and weight shift. Pt stood with min A to hemi-walker. At this time, recommend to mom and pt for pt to stand with support of hemi walker while caregiver assists with hygiene and clothing management due to poor dynamic standing balance at this time. Pt's mother checked off to assist pt to toilet and w/c <> bed.  Education and demonstration provided for self ROM exercises. Pt with trace tricep extension in gravity eliminated position, unable to observe/feel bicep activation. Education provided regarding ROM positions and rate of return. Pt left seated in w/c at end of session, all needs in reach and mother present.   Therapy Documentation Precautions:  Precautions Precautions:  Fall Precaution Comments: hemicraniectomy placement of bone flap abdominal pocket Required Braces or Orthoses: Other Brace/Splint Other Brace/Splint: Helmet when OOB Restrictions Weight Bearing Restrictions: No Pain:   No/ denies pain  See Function Navigator for Current Functional Status.   Therapy/Group:  Individual Therapy  Margareta Laureano L 08/22/2017, 7:13 AM

## 2017-08-22 NOTE — Progress Notes (Signed)
Subjective/Complaints: Pt states appetite is good, no sleep disturbance , appreciate Neuropsych note, prior hx of depression  ROS denies Pains, breathing problems, bowel or bladder issues Objective: Vital Signs: Blood pressure 137/86, pulse 71, temperature 98.2 F (36.8 C), temperature source Oral, resp. rate 16, weight 84.5 kg (186 lb 4.6 oz), SpO2 99 %. No results found. Results for orders placed or performed during the hospital encounter of 08/07/17 (from the past 72 hour(s))  Creatinine, serum     Status: None   Collection Time: 08/21/17  6:23 AM  Result Value Ref Range   Creatinine, Ser 0.89 0.61 - 1.24 mg/dL   GFR calc non Af Amer >60 >60 mL/min   GFR calc Af Amer >60 >60 mL/min    Comment: (NOTE) The eGFR has been calculated using the CKD EPI equation. This calculation has not been validated in all clinical situations. eGFR's persistently <60 mL/min signify possible Chronic Kidney Disease. Performed at [facility], [place] [address] [place], [place] [address]      HEENT: scalp incision CDI, mild ptosis left eye Cardio: RRR and no murmur Resp: CTA B/L and unlabored GI: BS positive and RLQ incision CDI, no abd denterness Extremity:  No Edema Skin:   Wound C/D/I and abd and scalp Neuro: Alert/Oriented, Abnormal Sensoryno withdrawl to pinch on R side, Abnormal Motor 0/5 in RUE and RLE, Tone:  Increased R pec, +flexor response RLE DTR R patellar , clonus R ankle , Aphasic and Other Right homonymous hemianopsia versus right neglect, difficulty with cooperating with confrontation testing Conus at the right ankle, increased tone in right knee extensors modified [place] 2 Musc/Skel:  Other no pain with UE or LE ROM Gen NAD   Assessment/Plan: 1. Functional deficits secondary to Left MCA infarct which require 3+ hours per day of interdisciplinary therapy in a comprehensive inpatient rehab setting. Physiatrist is providing close team supervision and 24 hour management of  active medical problems listed below. Physiatrist and rehab team continue to assess barriers to discharge/monitor patient progress toward functional and medical goals. FIM: Function - Bathing Position: Shower Body parts bathed by patient: Right arm, Left arm, Chest, Abdomen, Front perineal area, Buttocks, Right upper leg, Left upper leg Body parts bathed by helper: Right lower leg, Left lower leg, Back Assist Level: Touching or steadying assistance(Pt > 75%)  Function- Upper Body Dressing/Undressing What is the patient wearing?: Pull over shirt/dress Pull over shirt/dress - Perfomed by patient: Thread/unthread left sleeve, Put head through opening, Pull shirt over trunk Pull over shirt/dress - Perfomed by helper: Thread/unthread right sleeve Assist Level: 2 helpers Function - Lower Body Dressing/Undressing What is the patient wearing?: Underwear, Pants, Socks, Shoes Position: Education officer, museum at [employer] - Performed by patient: Thread/unthread left underwear leg, Pull underwear up/down Underwear - Performed by helper: Thread/unthread right underwear leg Pants- Performed by patient: Thread/unthread left pants leg, Pull pants up/down Pants- Performed by helper: Thread/unthread right pants leg Non-skid slipper socks- Performed by helper: Don/doff right sock, Don/doff left sock Socks - Performed by patient: Don/doff left sock Socks - Performed by helper: Don/doff right sock Shoes - Performed by patient: Don/doff left shoe Shoes - Performed by helper: Don/doff right shoe, Fasten right, Fasten left Assist for footwear: Partial/moderate assist Assist for lower body dressing: Touching or steadying assistance (Pt > 75%)  Function - Toileting Toileting activity did not occur: No continent bowel/bladder event Toileting steps completed by helper: Adjust clothing prior to toileting, Performs perineal hygiene, Adjust clothing after toileting Toileting Assistive  Devices: Grab bar or  rail Assist level: Two helpers  Function - Air cabin crew transfer activity did not occur: Safety/medical concerns Toilet transfer assistive device: Elevated toilet seat/BSC over toilet Mechanical lift: Stedy Assist level to toilet: 2 helpers Assist level from toilet: 2 helpers Assist level to bedside commode (at bedside): 2 helpers Assist level from bedside commode (at bedside): 2 helpers  Function - Chair/bed transfer Chair/bed transfer method: Stand pivot Chair/bed transfer assist level: Maximal assist (Pt 25 - 49%/lift and lower) Chair/bed transfer assistive device: Armrests Mechanical lift: Stedy Chair/bed transfer details: Manual facilitation for placement, Manual facilitation for weight bearing, Verbal cues for technique, Verbal cues for sequencing, Manual facilitation for weight shifting, Verbal cues for safe use of DME/AE  Function - Locomotion: Wheelchair Will patient use wheelchair at discharge?: Yes Type: Manual Max wheelchair distance: 100' Assist Level: Moderate assistance (Pt 50 - 74%) Assist Level: Moderate assistance (Pt 50 - 74%) Assist Level: Dependent (Pt equals 0%) Turns around,maneuvers to table,bed, and toilet,negotiates 3% grade,maneuvers on rugs and over doorsills: No Function - Locomotion: Ambulation Ambulation activity did not occur: Safety/medical concerns Assistive device: Rail in hallway Max distance: 30' Assist level: 2 helpers(max of 1 with w/c follow for safety) Walk 10 feet activity did not occur: Safety/medical concerns Assist level: 2 helpers Walk 50 feet with 2 turns activity did not occur: Safety/medical concerns Walk 10 feet on uneven surfaces activity did not occur: Safety/medical concerns  Function - Comprehension Comprehension: Auditory Comprehension assist level: Understands complex 90% of the time/cues 10% of the time  Function - Expression Expression: Verbal Expression assist level: Expresses complex 90% of the time/cues  < 10% of the time  Function - Social Interaction Social Interaction assist level: Interacts appropriately 90% of the time - Needs monitoring or encouragement for participation or interaction.  Function - Problem Solving Problem solving assist level: Solves basic 75 - 89% of the time/requires cueing 10 - 24% of the time  Function - Memory Memory assist level: Recognizes or recalls 75 - 89% of the time/requires cueing 10 - 24% of the time Patient normally able to recall (first 3 days only): Current season, Staff names and faces, That he or she is in a hospital, Location of own room  Medical Problem List and Plan: 1.Dense right hemiparesis and aphasia/dysphagiasecondary to left large MCA infarction/left M1 occlusion status post TPA with unsuccessful attempt at mechanical thrombectomy. Status post decompressive hemicraniectomy placement of bone flap abdominal pocket 07/29/2017. Will order protective helmet -CIR PT, OT, SLP , good cognitive recovery however still with severe motor deficits Developing increasing tone right lower limb, and Right pec consider tizanidine 2. DVT Prophylaxis/Anticoagulation: Subcutaneous Lovenox initiated 07/31/2017. Venous Doppler studies negative 3. Pain Management:Tylenol as needed, pain behind left eye discussed with optho, not likely due to pressure on optic nerve as that will cause loss of vision but not pain.  Will treat as HA with topiramate, 87m  Likely related to his cerebral edema.  Repeat CT showed improvement 4. Mood:Provide emotional support.Patient with coping issues due to his diagnosis and physical losses. Social worker to screen. Neuropsychology to be involved as well. Now on Ritalin and Lexapro 5. Neuropsych: This patient isnot completelycapable of making decisions on hisown behalf. 6. Skin/Wound Care:Routine skin checks, skin healing well scalp and abd, s/p staple removal 7. Fluids/Electrolytes/Nutrition:Routine I&O's,  maintaining on p.o.'s yesterday 8.Dysphagia. Dysphagia #3 thin liquid Follow-up speech therapy, intake and output records only 480 mL however patient is eating 100% of meals plus  drinking 240 mL with each meal.  9.Hypertension.Monitor with increased mobility Vitals:   08/21/17 1522 08/22/17 0510  BP: 130/86 137/86  Pulse: 87 71  Resp: 16 16  Temp: 98.9 F (37.2 C) 98.2 F (36.8 C)  SpO2: 96% 99%  Controlled off meds 2/5 10.Hyperlipidemia. Zetia/Lipitor 11. Elevated transanimase likely lipitor but < 3x ULN, monitor 12.  Bladder incontinence resolved 13.  Bowel incontinence resolved LOS (Days) 15 A FACE TO [place] 08/22/2017, 8:08 AM

## 2017-08-23 ENCOUNTER — Inpatient Hospital Stay (HOSPITAL_COMMUNITY): Payer: BLUE CROSS/BLUE SHIELD | Admitting: Physical Therapy

## 2017-08-23 ENCOUNTER — Inpatient Hospital Stay (HOSPITAL_COMMUNITY): Payer: BLUE CROSS/BLUE SHIELD | Admitting: Occupational Therapy

## 2017-08-23 ENCOUNTER — Inpatient Hospital Stay (HOSPITAL_COMMUNITY): Payer: BLUE CROSS/BLUE SHIELD | Admitting: Speech Pathology

## 2017-08-23 MED ORDER — TOPIRAMATE 25 MG PO TABS
25.0000 mg | ORAL_TABLET | Freq: Two times a day (BID) | ORAL | Status: DC
  Administered 2017-08-23 – 2017-09-07 (×30): 25 mg via ORAL
  Filled 2017-08-23 (×31): qty 1

## 2017-08-23 NOTE — Progress Notes (Signed)
Physical Therapy Weekly Progress Note  Patient Details  Name: Jeremy [name] MRN: [id number] Date of Birth: May 18, 1976  Beginning of progress report period: August 16, 2017 End of progress report period: August 23, 2017  Today's Date: 08/23/2017 PT Individual Time: 0800-0915 PT Individual Time Calculation (min): 75 min   Patient has met 4 of 4 short term goals.  Pt has made good progress with therapies this week progressing to Max +2 assist for transfers to mod assist. Supervision assist with WC mobility using Hemi technique, pt able to maintain sitting balance with min-supervision assist ad standing balance with as little as mod assist.   Patient continues to demonstrate the following deficits muscle weakness and muscle paralysis, impaired timing and sequencing, abnormal tone, motor apraxia, decreased coordination and decreased motor planning, decreased midline orientation, decreased attention to right, right side neglect and decreased motor planning and decreased sitting balance, decreased standing balance, decreased postural control, hemiplegia and decreased balance strategies and therefore will continue to benefit from skilled PT intervention to increase functional independence with mobility.  Patient progressing toward long term goals..  Continue plan of care.  PT Short Term Goals Week 1:  PT Short Term Goal 1 (Week 1): Pt will transfers consistently with max assist of 1.  PT Short Term Goal 1 - Progress (Week 1): Met PT Short Term Goal 2 (Week 1): Pt will maintain static sitting balance with mod assist x 2 min  PT Short Term Goal 2 - Progress (Week 1): Met PT Short Term Goal 3 (Week 1): Pt will initiate WC propulsion  PT Short Term Goal 3 - Progress (Week 1): Met PT Short Term Goal 4 (Week 1): Pt will perform bed mobility with max assist 50% of the time.  PT Short Term Goal 4 - Progress (Week 1): Met PT Short Term Goal 5 (Week 1): Pt will initiate gait training.  PT Short Term Goal 5 -  Progress (Week 1): Met Week 2:  PT Short Term Goal 1 (Week 2): Pt will propell WC 20f with max assist.  PT Short Term Goal 1 - Progress (Week 2): Met PT Short Term Goal 2 (Week 2): Pt will sit EOB with mod assist x 2 minutes.  PT Short Term Goal 2 - Progress (Week 2): Met PT Short Term Goal 3 (Week 2): Pt will perform bed mobility with mod assist  PT Short Term Goal 3 - Progress (Week 2): Met PT Short Term Goal 4 (Week 2): Pt will transfer to [facility] assist 50% of the time PT Short Term Goal 4 - Progress (Week 2): Met Week 3:  PT Short Term Goal 1 (Week 3): Pt will [facility] to [facility] assist  PT Short Term Goal 2 (Week 3): Pt will propell WC >1544fwith supervisoin assist consistently  PT Short Term Goal 3 (Week 3): Pt will ambulate 3036fith max assist of 1  PT Short Term Goal 4 (Week 3): Pt will maintain sitting balance with supervision assist up to 10 minutes  PT Short Term Goal 5 (Week 3): Pt will maintain standing balance with min assist and 1 UE support for up to 2 min.   Skilled Therapeutic Interventions/Progress Updates:   Pt received supine in bed and agreeable to PT. Supine>sit transfer with mod assist and max cues for sequencing and technique. PT donned pt's shoes for time management.   Squat pivot transfer to WC [facility] mod assist from PT. WC mobility with supervision assist x [id number] cues  for awareness of obstacles on the R.Car transfers with mod assist from PT with max cues for positioning, techinque, and posture.   Static standing balance with 1 UE support. With min-mod assist 1 min  X 4. Pt noted to have improved righting reactions on first 3 trials. Small seated rest break. Dynamic standing balance to reach for card on wall. Mod -max assist from PT. Lateral reaching to the L to decrease weight shifting ability to grab cups. Pt noted to have extreme difficulty to shift hips beyond midline position. Weight shifting in squat stance with max assist. Pt noted have difficulty  holding squat position with the LLE, wanting to extend LE.   Gait training instructed by PT with max assist +2 for WC follow x 56f with HW and Blue rocker AFO. Pt required to advance the RLE. But continues to demonstrate adequate knee extension to prevent knee buckling on the R.   Patient returned to room and [facility] in reach and all needs met.            Therapy Documentation Precautions:  Precautions Precautions: Fall Precaution Comments: hemicraniectomy placement of bone flap abdominal pocket Required Braces or Orthoses: Other Brace/Splint Other Brace/Splint: Helmet when OOB Restrictions Weight Bearing Restrictions: No    Pain: Denies   See Function Navigator for Current Functional Status.  Therapy/Group: Individual Therapy  ALorie Phenix2/01/2018, 9:17 AM
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
## 2017-08-23 NOTE — Progress Notes (Signed)
Occupational Therapy Session Note  Patient Details  Name: Jeremy [name] MRN: [id number] Date of Birth: 11/15/1975  Today's Date: 08/23/2017 OT Individual Time: 1300-1400 OT Individual Time Calculation (min): 60 min    Short Term Goals: Week 3:  OT Short Term Goal 1 (Week 3): Pt will complete squat pivot transfer R<>L to toilet with min A OT Short Term Goal 2 (Week 3): Pt will maintain static standing balance with L UE support during LB clothing management task (completed total A) in order to reduce caregiver burden OT Short Term Goal 3 (Week 3): Pt will sit EOB to complete dressing task with no more than mod A for dynamic sitting balance  Skilled Therapeutic Interventions/Progress Updates:    Pt seen for OT ADL bathing/dressing session. Pt sitting up in w/c upon arrival with mother and wife present, pt agreeable to tx session and desiring to shower. Pt's behavior and attitude much improved today and over past couple of sessions, not displaying any defiant behaviors this sessions and agreeable to all requests made by therapist.  He doffed UB clothing with VCs for problem solving technique using hemi style.  Completed min A squat pivot [facility] shower with use of grab bars, min A when transferring to strong R side, however, required max A to exit shower transferring to hemi R side due to difficulty obtaining and maintaining proper R foot placement and difficulty with head/hip relationship during transfer due to space constraints. He bathed seated on BSC in shower, demonstrates improved dynamic sitting balance, requiring steadying assist only when completing lateral leans for buttock hygiene.  He returned to w/c to dress. Required VCs for use of hemi technique and max multimodal cuing for problem solving modified technqiue to don shirt. With assist to obtain and maintain figure four sitting, pt able to cross R ankle of knee in order to thread pants and don socks/shoes. He stood at hemi walker  with min A and min-mod A for static standing balance while therapist assisted with clothing management.  Pt left seated in w/c at end of session with family members present.   Therapy Documentation Precautions:  Precautions Precautions: Fall Precaution Comments: hemicraniectomy placement of bone flap abdominal pocket Required Braces or Orthoses: Other Brace/Splint Other Brace/Splint: Helmet when OOB Restrictions Weight Bearing Restrictions: No Pain:   No/ denies pain  See Function Navigator for Current Functional Status.   Therapy/Group: Individual Therapy  Kiri Hinderliter L 08/23/2017, 7:19 AM

## 2017-08-23 NOTE — Progress Notes (Signed)
Social Work Patient ID: Jeremy [name], male   DOB: May 13, 1976, 42 y.o.   MRN: [id number]  Met with pt, Mom and wife to discuss team conference progression toward his goals and the progress he has made this week. All are pleased with and hopeful it will continue. Pt is more invested in the program and willing to assert himself in therapies. Will continue to have neuro-psych see him weekly since this seems to be helping pt with his coping. Will continue work on discharge needs. Mom is starting to participate in therapies and learn his care, as therapy team feels appropriate.

## 2017-08-23 NOTE — Progress Notes (Signed)
Speech Language Pathology Daily Session Note  Patient Details  Name: Jeremy [name] MRN: [id number] Date of Birth: 12/11/1975  Today's Date: 08/23/2017 SLP Individual Time: 1100-1200 SLP Individual Time Calculation (min): 60 min  Short Term Goals: Week 3: SLP Short Term Goal 1 (Week 3): Pt will consume trials of regular textures without overt s/s of aspiration and supervision cuesto demonstrate readiness for diet upgrade. SLP Short Term Goal 1 - Progress (Week 3): Met SLP Short Term Goal 2 (Week 3): Given Mod A cues, pt will scan to right of midline to locate objects during semi-complex tasks.  SLP Short Term Goal 3 (Week 3): Pt will complete semi-complex tasks with supervision cues.  SLP Short Term Goal 4 (Week 3): Pt will demonstrate task initiation with Min A cues in 80% of opportunities.  SLP Short Term Goal 5 (Week 3): Pt will demonstrate intellectual awareness by identifying 2 physical and 2 cognitive deficits with Min A cues.  SLP Short Term Goal 6 (Week 3): Pt will demonstrate selective attention in moderately distracting environment for ~ 45 minutes with MIn A cues.   Skilled Therapeutic Interventions:  Skilled treatment session focused on dysphagia and cognition goals. SLP facilitated session by providing direct supervision of pt consuming regular lunch tray with thin liquids. Pt with continued fast rate and bit his anterior right lip without feeling it. Pt able to identify the reasons behind biting lip (fast rate and CVA feeling on that side). SLP further facilitated session by providing Mod A cues to monitor cards on his right during previously learned card game and novel card game. Pt able to view video online and implement rules in game with Mod A faded to Min A within extremely distracting environment. Pt with 1 insulting commend to wife and new ground rules set for ST sessions - no insults allowed during therapy session. Pt able to adhere to rules. Pt left with wife and mother. Pt's diet  upgraded to regular with thin liquids.      Function:  Eating Eating   Modified Consistency Diet: No Eating Assist Level: Swallowing techniques: self managed           Cognition Comprehension Comprehension assist level: Understands complex 90% of the time/cues 10% of the time  Expression   Expression assist level: Expresses complex 90% of the time/cues < 10% of the time  Social Interaction Social Interaction assist level: Interacts appropriately 90% of the time - Needs monitoring or encouragement for participation or interaction.  Problem Solving Problem solving assist level: Solves complex 90% of the time/cues < 10% of the time  Memory Memory assist level: Recognizes or recalls 90% of the time/requires cueing < 10% of the time;More than reasonable amount of time    Pain Pain Assessment Pain Assessment: No/denies pain  Therapy/Group: Individual Therapy    08/23/2017, 12:32 PM   

## 2017-08-23 NOTE — Progress Notes (Signed)
Patient refused his suppository this morning. Explained risks & benefits, informed patient that it was part of the bowel program & that it was intended to train his bowel. Patient still refused. Shook his head & hand & said "no".

## 2017-08-23 NOTE — Progress Notes (Signed)
Communication left for the provider & oncoming nurse to be made aware. No acute distress noted.

## 2017-08-24 ENCOUNTER — Inpatient Hospital Stay (HOSPITAL_COMMUNITY): Payer: BLUE CROSS/BLUE SHIELD | Admitting: Occupational Therapy

## 2017-08-24 ENCOUNTER — Inpatient Hospital Stay (HOSPITAL_COMMUNITY): Payer: BLUE CROSS/BLUE SHIELD | Admitting: Physical Therapy

## 2017-08-24 ENCOUNTER — Inpatient Hospital Stay (HOSPITAL_COMMUNITY): Payer: BLUE CROSS/BLUE SHIELD | Admitting: Speech Pathology

## 2017-08-24 NOTE — Progress Notes (Signed)
Occupational Therapy Session Note  Patient Details  Name: Jeremy GashJao [name] MRN: [id number] Date of Birth: 12-05-1975   Today's Date: 08/24/2017 OT Individual Time: [id number] OT Individual Time Calculation (min): 30 min   Skilled Therapeutic Interventions/Progress Updates:    OT treatment session focused on R UE NMR and NMES. Pt completed sit<>stand in standing frame with Mod A and facilitated weight bearing through L UE for neuro re-ed. 1:1 NMES applied to CH1 supraspinatus and middle deltoid to help approximate shoulder joint to reduce sublux and reduce pain. CH 2. Wrist extensors.   Ratio 1:1 Rate 35 pps Waveform- Asymmetric Ramp 1.0 Pulse 300  CH1 Intensity-18 Duration- 15  CH 2 Intensity- 24 Duration - 15  No adverse reactions after treatment and is skin intact. Pt reported feeling in R shoulder-elbow. Unable to feel distally past elbow so Intensity kept low while eliciting motor movements. E-stim able to activate wrist extension.  Therapy Documentation Precautions:  Precautions Precautions: Fall Precaution Comments: hemicraniectomy placement of bone flap abdominal pocket Required Braces or Orthoses: Other Brace/Splint Other Brace/Splint: Helmet when OOB Restrictions Weight Bearing Restrictions: No  See Function Navigator for Current Functional Status.   Therapy/Group: Individual Therapy  [name] 08/24/2017, 1:44 PM

## 2017-08-24 NOTE — Plan of Care (Signed)
  Progressing Consults RH STROKE PATIENT EDUCATION Description See Patient Education module for education specifics  08/24/2017 0541 - Progressing by Marigene EhlersGonzalez, Tyyonna Soucy, RN RH BOWEL ELIMINATION RH STG MANAGE BOWEL WITH ASSISTANCE Description STG Manage Bowel with mod Assistance.  08/24/2017 0541 - Progressing by Marigene EhlersGonzalez, Vibha Ferdig, RN RH BLADDER ELIMINATION RH STG MANAGE BLADDER WITH ASSISTANCE Description STG Manage Bladder With mod Assistance  08/24/2017 0541 - Progressing by Marigene EhlersGonzalez, Kahli Fitzgerald, RN RH SKIN INTEGRITY RH STG SKIN FREE OF INFECTION/BREAKDOWN Description Skin to remain free from infection and breakdown while on rehab with max assist.  08/24/2017 0541 - Progressing by Marigene EhlersGonzalez, Lisbet Busker, RN RH SAFETY RH STG ADHERE TO SAFETY PRECAUTIONS W/ASSISTANCE/DEVICE Description STG Adhere to Safety Precautions With max Assistance and appropriate assistive Device.  08/24/2017 0541 - Progressing by Marigene EhlersGonzalez, Temekia Caskey, RN RH PAIN MANAGEMENT RH STG PAIN MANAGED AT OR BELOW PT'S PAIN GOAL Description <3 on a 0-10 pain scale  08/24/2017 0541 - Progressing by Marigene EhlersGonzalez, Lovie Zarling, RN

## 2017-08-24 NOTE — Progress Notes (Signed)
Occupational Therapy Session Note  Patient Details  Name: Jeremy Maietta Cletus GashMRN: [id number] Date of Birth: [birth year]  Today's Date: 08/24/2017 OT Individual Time: 1400-1500 OT Individual Time Calculation (min): 60 min    Short Term Goals: Week 3:  OT Short Term Goal 1 (Week 3): Pt will complete squat pivot transfer R<>[name] to toilet with min A OT Short Term Goal 2 (Week 3): Pt will maintain static standing balance with [name] UE support during LB clothing management task (completed total A) in order to reduce caregiver burden OT Short Term Goal 3 (Week 3): Pt will sit EOB to complete dressing task with no more than mod A for dynamic sitting balance  Skilled Therapeutic Interventions/Progress Updates:    Pt seen for OT session focusing on neuro ree-ed f R UE. Pt sitting up in w/c upon arrival, with encouragement willing to participate in tx session. He self propelled w/c to therapy gym with increased time and mod cuing for awareness of environmental obstacles on R side, though believe pt required this much cuing due to behavioral reasons vs. True R inattention. He completed standing sorting task at high/low table with R UE placed in weightbearing position and on textured air mat for increased proprioception. R knee blocked by therapist and pt requiring min- occasional mod A for static standing balance due to R and posterior bias in standing, able to correct with VCs for return to midline. Pt tolerated ~3-4 minutes in standing before requiring seated rest break.  Pt transitioned to therapy mat, completing squat pivot transfers with mod A. In [name] side lying, completed gravity eliminated shoulder and elbow flexion/extension. Pt with decreased attention to task, requiring frequent cues to maintain attention and alertness to task. Elbow flexion/extention movements noted when pt using arm skate on table from side-lying position, however, due to pt's behavioral attitude, unable to truly assess pt's actual abilities. Pt  returned to sitting EOM with steadying assist and VCs for technique. Pt returned back to room at end of session, left seated in w/c with wife and mother present.   Therapy Documentation Precautions:  Precautions Precautions: Fall Precaution Comments: hemicraniectomy placement of bone flap abdominal pocket Required Braces or Orthoses: Other Brace/Splint Other Brace/Splint: Helmet when OOB Restrictions Weight Bearing Restrictions: No Pain: Pain Assessment Pain Assessment: No/denies pain  See Function Navigator for Current Functional Status.   Therapy/Group: Individual Therapy  Jeremy [name] 08/24/2017, 7:09 AM

## 2017-08-24 NOTE — Progress Notes (Signed)
Subjective/Complaints: Left "behind" the Eye pain improving, tried to have BM this am, last BM 2/7              ROS denies Pains, breathing problems, bowel or bladder issues Objective: Vital Signs: Blood pressure (!) 136/93, pulse 77, temperature 98.9 F (37.2 C), temperature source Oral, resp. rate 17, weight 84.5 kg (186 lb 4.6 oz), SpO2 98 %. No results found. No results found for this or any previous visit (from the past 72 hour(s)).   HEENT: scalp incision CDI, mild ptosis left eye Cardio: RRR and no murmur Resp: CTA B/L and unlabored GI: BS positive and RLQ incision CDI, no abd denterness Extremity:  No Edema Skin:   Wound C/D/I and abd and scalp Neuro: Alert/Oriented, Abnormal Sensoryno withdrawl to pinch on R side, Abnormal Motor 0/5 in RUE and RLE, Tone:  Increased R pec, +flexor response RLE DTR R patellar , clonus R ankle , Aphasic and right visual field def Conus at the right ankle, increased tone in right knee extensors modified [place] 2 Musc/Skel:  Other no pain with UE or LE ROM Gen NAD   Assessment/Plan: 1. Functional deficits secondary to Left MCA infarct which require 3+ hours per day of interdisciplinary therapy in a comprehensive inpatient rehab setting. Physiatrist is providing close team supervision and 24 hour management of active medical problems listed below. Physiatrist and rehab team continue to assess barriers to discharge/monitor patient progress toward functional and medical goals. FIM: Function - Bathing Position: Shower Body parts bathed by patient: Right arm, Left arm, Chest, Abdomen, Front perineal area, Buttocks, Right upper leg, Left upper leg, Left lower leg Body parts bathed by helper: Right lower leg, Back Assist Level: Touching or steadying assistance(Pt > 75%)  Function- Upper Body Dressing/Undressing What is the patient wearing?: Pull over shirt/dress Pull over shirt/dress - Perfomed by patient: Thread/unthread left sleeve, Put head  through opening, Pull shirt over trunk, Thread/unthread right sleeve Pull over shirt/dress - Perfomed by helper: Thread/unthread right sleeve Assist Level: Touching or steadying assistance(Pt > 75%) Function - Lower Body Dressing/Undressing What is the patient wearing?: Underwear, Pants, Socks, Shoes Position: Wheelchair/chair at [employer] Underwear - Performed by patient: Thread/unthread left underwear leg, Thread/unthread right underwear leg Underwear - Performed by helper: Pull underwear up/down Pants- Performed by patient: Thread/unthread left pants leg Pants- Performed by helper: Pull pants up/down, Thread/unthread right pants leg Non-skid slipper socks- Performed by helper: Don/doff right sock, Don/doff left sock Socks - Performed by patient: Don/doff right sock, Don/doff left sock Socks - Performed by helper: Don/doff right sock Shoes - Performed by patient: Don/doff right shoe, Don/doff left shoe Shoes - Performed by helper: Fasten right, Fasten left Assist for footwear: Maximal assist Assist for lower body dressing: Touching or steadying assistance (Pt > 75%)  Function - Toileting Toileting activity did not occur: No continent bowel/bladder event Toileting steps completed by helper: Adjust clothing prior to toileting, Performs perineal hygiene, Adjust clothing after toileting Toileting Assistive Devices: Grab bar or rail Assist level: Supervision or verbal cues  Function - ArchivistToilet Transfers Toilet transfer activity did not occur: Safety/medical concerns Toilet transfer assistive device: Grab bar Mechanical lift: Stedy Assist level to toilet: 2 helpers Assist level from toilet: 2 helpers Assist level to bedside commode (at bedside): 2 helpers Assist level from bedside commode (at bedside): 2 helpers  Function - Chair/bed transfer Chair/bed transfer method: Squat pivot Chair/bed transfer assist level: Moderate assist (Pt 50 - 74%/lift or lower) Chair/bed transfer assistive  device:  Armrests Mechanical lift: Stedy Chair/bed transfer details: Manual facilitation for placement, Manual facilitation for weight bearing, Verbal cues for technique, Verbal cues for sequencing, Manual facilitation for weight shifting, Verbal cues for safe use of DME/AE  Function - Locomotion: Wheelchair Will patient use wheelchair at discharge?: Yes Type: Manual Max wheelchair distance: 125ft  Assist Level: Supervision or verbal cues Assist Level: Supervision or verbal cues Assist Level: Supervision or verbal cues Turns around,maneuvers to table,bed, and toilet,negotiates 3% grade,maneuvers on rugs and over doorsills: No Function - Locomotion: Ambulation Ambulation activity did not occur: Safety/medical concerns Assistive device: Walker-hemi Max distance: 87ft Assist level: 2 helpers Walk 10 feet activity did not occur: Safety/medical concerns Assist level: 2 helpers Walk 50 feet with 2 turns activity did not occur: Safety/medical concerns Assist level: 2 helpers Walk 10 feet on uneven surfaces activity did not occur: Safety/medical concerns  Function - Comprehension Comprehension: Auditory Comprehension assist level: Understands complex 90% of the time/cues 10% of the time  Function - Expression Expression: Verbal Expression assist level: Expresses complex 90% of the time/cues < 10% of the time  Function - Social Interaction Social Interaction assist level: Interacts appropriately 90% of the time - Needs monitoring or encouragement for participation or interaction.  Function - Problem Solving Problem solving assist level: Solves complex 90% of the time/cues < 10% of the time  Function - Memory Memory assist level: Recognizes or recalls 90% of the time/requires cueing < 10% of the time, More than reasonable amount of time Patient normally able to recall (first 3 days only): Current season, Staff names and faces, That he or she is in a hospital, Location of own  room  Medical Problem List and Plan: 1.Dense right hemiparesis and aphasia/dysphagiasecondary to left large MCA infarction/left M1 occlusion status post TPA with unsuccessful attempt at mechanical thrombectomy. Status post decompressive hemicraniectomy placement of bone flap abdominal pocket 07/29/2017. Will order protective helmet -CIR PT, OT, SLP , we discussed lack of proprioception and need to watch for movement in limb and use functional activities 2. DVT Prophylaxis/Anticoagulation: Subcutaneous Lovenox initiated 07/31/2017. Venous Doppler studies negative 3. Pain Management:Tylenol as needed, pain behind left eye discussed with optho, not likely due to pressure on optic nerve as that will cause loss of vision but not pain.  Will treat as HA with topiramate, 25mg  increase to BID  Likely related to his cerebral edema.  Repeat CT showed improvement 4. Mood:Provide emotional support.Patient with coping issues due to his diagnosis and physical losses. Social worker to screen. Neuropsychology to be involved as well. Now on Ritalin and Lexapro 5. Neuropsych: This patient isnot completelycapable of making decisions on hisown behalf. 6. Skin/Wound Care:Routine skin checks, skin healing well scalp and abd, s/p staple removal 7. Fluids/Electrolytes/Nutrition:Routine I&O's, maintaining on p.o.'s yesterday 8.Dysphagia. Dysphagia #3 thin liquid Follow-up speech therapy, intake and output records only 480 mL however patient is eating 100% of meals plus drinking 240 mL with each meal.  9.Hypertension.Monitor with increased mobility Vitals:   08/23/17 1500 08/24/17 0500  BP: 133/88 (!) 136/93  Pulse: 71 77  Resp: 16 17  Temp: 99.1 F (37.3 C) 98.9 F (37.2 C)  SpO2: 100% 98%  Controlled off meds 2/7, monitor diastolic elevation for now 10.Hyperlipidemia. Zetia/Lipitor 11. Elevated transanimase likely lipitor but < 3x ULN, monitor 12.  Bladder incontinence  resolved 13.  Bowel incontinence resolved LOS (Days) 17 A FACE TO FACE EVALUATION WAS PERFORMED  Jeremy [name] 08/24/2017, 7:16 AM

## 2017-08-24 NOTE — Progress Notes (Signed)
Physical Therapy Session Note  Patient Details  Name: Jeremy [name] MRN: [id number] Date of Birth: [birth year]  Today's Date: 08/24/2017 PT Individual Time: 2836-6294 AND 7654-6503 PT Individual Time Calculation (min): 45 min AND 25 min   Short Term Goals: Week 3:  PT Short Term Goal 1 (Week 3): Pt will transfer to [facility] with min assist  PT Short Term Goal 2 (Week 3): Pt will propell WC >113f with supervisoin assist consistently  PT Short Term Goal 3 (Week 3): Pt will ambulate 381fwith max assist of 1  PT Short Term Goal 4 (Week 3): Pt will maintain sitting balance with supervision assist up to 10 minutes  PT Short Term Goal 5 (Week 3): Pt will maintain standing balance with min assist and 1 UE support for up to 2 min.   Skilled Therapeutic Interventions/Progress Updates:   Pt received sitting in WC and agreeable to PT  PT instructed pt in WC mobility to rehab gym with Hemi technique x [id number] Supervision assist from PT and moderate cues for attention to R side on this day. Pt noted to be more easily distracted on this day, limiting safety awareness of obstacles on the R.   Stair negotiation training instructed by PT with mod-max assist from PT x 4 steps. Pt noted to have intermittent extensor tone in the RLE .   Pt's mother instructed in transfer training to and from bed with mod assist to the L and min assist to the R. Moderate multimodal instruction to pt and mother for proper set up and hand hold to improve facilitation of lumber rotation.   Car transfer training with mod assist from PT and moderate cues for positioning and weight shift to improve safety of transfer. Patient returned to room and [facility] and all needs met.     Session 2.   Pt received sitting in WC and agreeable to PT. Pt transported to rehab gym in wc. Pt instructed in gait training with HW and max assist to advance the RLE, PT also provided max cues for sequencing and HW. Pt noted to  advance the RLE 2 time through 2 bouts of gait training, [id number]. Sitting balance without UE support x 5 minutes Pt able to maintain upright posture, once assist by PT into proper position.   [facility] returned to room and [facility] and all needs met.         Therapy Documentation Precautions:  Precautions Precautions: Fall Precaution Comments: hemicraniectomy placement of bone flap abdominal pocket Required Braces or Orthoses: Other Brace/Splint Other Brace/Splint: Helmet when OOB Restrictions Weight Bearing Restrictions: No Pain: Pain Assessment Pain Assessment: No/denies pain   See Function Navigator for Current Functional Status.   Therapy/Group: Individual Therapy  AustLorie Phenix/2019, 10:42 AM

## 2017-08-24 NOTE — Progress Notes (Signed)
Speech Language Pathology Daily Session Note  Patient Details  Name: Jeremy [name] MRN: [id number] Date of Birth: [birth year]  Today's Date: 08/24/2017 SLP Individual Time: 5927-6394 SLP Individual Time Calculation (min): 30 min  Short Term Goals: Week 3: SLP Short Term Goal 1 (Week 3): Pt will consume trials of regular textures without overt s/s of aspiration and supervision cuesto demonstrate readiness for diet upgrade. SLP Short Term Goal 1 - Progress (Week 3): Met SLP Short Term Goal 2 (Week 3): Given Mod A cues, pt will scan to right of midline to locate objects during semi-complex tasks.  SLP Short Term Goal 3 (Week 3): Pt will complete semi-complex tasks with supervision cues.  SLP Short Term Goal 4 (Week 3): Pt will demonstrate task initiation with Min A cues in 80% of opportunities.  SLP Short Term Goal 5 (Week 3): Pt will demonstrate intellectual awareness by identifying 2 physical and 2 cognitive deficits with Min A cues.  SLP Short Term Goal 6 (Week 3): Pt will demonstrate selective attention in moderately distracting environment for ~ 45 minutes with MIn A cues.   Skilled Therapeutic Interventions: Skilled treatment session focused on cognition goals. SLP facilitated session by providing Min A to complete daily scheduling task d/t visual perceptual deficits. Pt required Min A question cues to answer intellectual awareness questions and he was able to hold attention to midline for ~ 30 minutes without any cues. Pt with great progress throughout week. Pt returned to room and left with mother and wife.      Function:    Cognition Comprehension Comprehension assist level: Understands complex 90% of the time/cues 10% of the time  Expression   Expression assist level: Expresses complex 90% of the time/cues < 10% of the time  Social Interaction Social Interaction assist level: Interacts appropriately 90% of the time - Needs monitoring or encouragement for participation or interaction.   Problem Solving Problem solving assist level: Solves complex 90% of the time/cues < 10% of the time  Memory Memory assist level: Recognizes or recalls 90% of the time/requires cueing < 10% of the time;More than reasonable amount of time    Pain    Therapy/Group: Individual Therapy  Jeremy [name] 08/24/2017, 4:59 PM

## 2017-08-25 ENCOUNTER — Inpatient Hospital Stay (HOSPITAL_COMMUNITY): Payer: BLUE CROSS/BLUE SHIELD | Admitting: Physical Therapy

## 2017-08-25 ENCOUNTER — Encounter (HOSPITAL_COMMUNITY): Payer: Self-pay

## 2017-08-25 ENCOUNTER — Other Ambulatory Visit: Payer: Self-pay

## 2017-08-25 DIAGNOSIS — I10 Essential (primary) hypertension: Secondary | ICD-10-CM

## 2017-08-25 DIAGNOSIS — I69351 Hemiplegia and hemiparesis following cerebral infarction affecting right dominant side: Secondary | ICD-10-CM

## 2017-08-25 DIAGNOSIS — G441 Vascular headache, not elsewhere classified: Secondary | ICD-10-CM

## 2017-08-25 NOTE — Progress Notes (Signed)
Physical Therapy Session Note  Patient Details  Name: Jeremy [name] MRN: [id number] Date of Birth: [birth year]  Today's Date: 08/25/2017 PT Individual Time: 1515-1600 PT Individual Time Calculation (min): 45 min   Short Term Goals: Week 3:  PT Short Term Goal 1 (Week 3): Pt will transfer to [facility] with min assist  PT Short Term Goal 2 (Week 3): Pt will propell WC >133f with supervisoin assist consistently  PT Short Term Goal 3 (Week 3): Pt will ambulate 382fwith max assist of 1  PT Short Term Goal 4 (Week 3): Pt will maintain sitting balance with supervision assist up to 10 minutes  PT Short Term Goal 5 (Week 3): Pt will maintain standing balance with min assist and 1 UE support for up to 2 min.   Skilled Therapeutic Interventions/Progress Updates:   Pt received sitting in WC and agreeable to PT  Squat pivot with min-mod assist throughout treatment. Mod cues for sequencing.    Supine, sidelying and qped NMR. Powder board to attempt active hip/knee flexion 3 x 2 min . Reciprocal hip/knee flexion with active hip/knee extension in the RLE 3 x 2 min . Pt assisted to assume qped position and maintain with forward/backward and lateral Weight shift x 5 min.   [facility] and all needs met.        Therapy Documentation Precautions:  Precautions Precautions: Fall Precaution Comments: hemicraniectomy placement of bone flap abdominal pocket Required Braces or Orthoses: Other Brace/Splint Other Brace/Splint: Helmet when OOB Restrictions Weight Bearing Restrictions: No Pain: Pain Assessment Pain Assessment: No/denies pain   See Function Navigator for Current Functional Status.   Therapy/Group: Individual Therapy  AuLorie Phenix/03/2018, 5:14 PM

## 2017-08-25 NOTE — Progress Notes (Signed)
Subjective/Complaints: Patient seen lying in bed this morning. He states he slept well overnight.  ROS Denies chest pains, breathing problems, bowel or bladder issues   Objective: Vital Signs: Blood pressure 137/81, pulse 61, temperature 98.1 F (36.7 C), temperature source Oral, resp. rate 18, weight 84.5 kg (186 lb 4.6 oz), SpO2 99 %. No results found. No results found for this or any previous visit (from the past 72 hour(s)).   HEENT: scalp incision CDI, mild ptosis left eye Cardio: RRR and no JVD Resp: CTA B/L and unlabored GI: BS positive and RLQ incision CDI, no abd denterness Musculoskeletal:  No Edema. No tenderness Skin:   Wound C/D/I and abd and scalp Neuro: Alert/Oriented,  Motor: Right upper extremity/right lower extremity: 0/5 proximal to distal Sensation absent to light touch right upper extremity/right lower extremity Tone:  Increased R pec, wrist flexors  clonus R ankle  Gen NAD. Vital signs reviewed.  Assessment/Plan: 1. Functional deficits secondary to Left MCA infarct which require 3+ hours per day of interdisciplinary therapy in a comprehensive inpatient rehab setting. Physiatrist is providing close team supervision and 24 hour management of active medical problems listed below. Physiatrist and rehab team continue to assess barriers to discharge/monitor patient progress toward functional and medical goals. FIM: Function - Bathing Position: Shower Body parts bathed by patient: Right arm, Left arm, Chest, Abdomen, Front perineal area, Buttocks, Right upper leg, Left upper leg, Left lower leg Body parts bathed by helper: Right lower leg, Back Assist Level: Touching or steadying assistance(Pt > 75%)  Function- Upper Body Dressing/Undressing What is the patient wearing?: Pull over shirt/dress Pull over shirt/dress - Perfomed by patient: Thread/unthread left sleeve, Put head through opening, Pull shirt over trunk, Thread/unthread right sleeve Pull over  shirt/dress - Perfomed by helper: Thread/unthread right sleeve Assist Level: Touching or steadying assistance(Pt > 75%) Function - Lower Body Dressing/Undressing What is the patient wearing?: Underwear, Pants, Socks, Shoes Position: Wheelchair/chair at [employer] - Performed by patient: Thread/unthread left underwear leg, Thread/unthread right underwear leg Underwear - Performed by helper: Pull underwear up/down Pants- Performed by patient: Thread/unthread left pants leg Pants- Performed by helper: Pull pants up/down, Thread/unthread right pants leg Non-skid slipper socks- Performed by helper: Don/doff right sock, Don/doff left sock Socks - Performed by patient: Don/doff right sock, Don/doff left sock Socks - Performed by helper: Don/doff right sock Shoes - Performed by patient: Don/doff right shoe, Don/doff left shoe Shoes - Performed by helper: Fasten right, Fasten left Assist for footwear: Maximal assist Assist for lower body dressing: Touching or steadying assistance (Pt > 75%)  Function - Toileting Toileting activity did not occur: No continent bowel/bladder event Toileting steps completed by helper: Adjust clothing prior to toileting, Performs perineal hygiene, Adjust clothing after toileting Toileting Assistive Devices: Grab bar or rail Assist level: Touching or steadying assistance (Pt.75%)  Function - Archivist transfer activity did not occur: Safety/medical concerns Toilet transfer assistive device: Grab bar Mechanical lift: Stedy Assist level to toilet: 2 helpers Assist level from toilet: 2 helpers Assist level to bedside commode (at bedside): 2 helpers Assist level from bedside commode (at bedside): 2 helpers  Function - Chair/bed transfer Chair/bed transfer method: Squat pivot Chair/bed transfer assist level: Touching or steadying assistance (Pt > 75%) Chair/bed transfer assistive device: Armrests Mechanical lift: Stedy Chair/bed transfer details:  Manual facilitation for placement, Manual facilitation for weight bearing, Verbal cues for technique, Verbal cues for sequencing, Manual facilitation for weight shifting, Verbal cues for safe use of DME/AE  Function - Locomotion: Wheelchair Will patient use wheelchair at discharge?: Yes Type: Manual Max wheelchair distance: 19550ft  Assist Level: Supervision or verbal cues Assist Level: Supervision or verbal cues Assist Level: Supervision or verbal cues Turns around,maneuvers to table,bed, and toilet,negotiates 3% grade,maneuvers on rugs and over doorsills: No Function - Locomotion: Ambulation Ambulation activity did not occur: Safety/medical concerns Assistive device: Walker-hemi Max distance: 5940ft  Assist level: Maximal assist (Pt 25 - 49%) Walk 10 feet activity did not occur: Safety/medical concerns Assist level: Maximal assist (Pt 25 - 49%) Walk 50 feet with 2 turns activity did not occur: Safety/medical concerns Assist level: 2 helpers Walk 10 feet on uneven surfaces activity did not occur: Safety/medical concerns  Function - Comprehension Comprehension: Auditory Comprehension assist level: Understands complex 90% of the time/cues 10% of the time  Function - Expression Expression: Verbal Expression assist level: Expresses complex 90% of the time/cues < 10% of the time  Function - Social Interaction Social Interaction assist level: Interacts appropriately 90% of the time - Needs monitoring or encouragement for participation or interaction.  Function - Problem Solving Problem solving assist level: Solves complex 90% of the time/cues < 10% of the time  Function - Memory Memory assist level: Recognizes or recalls 90% of the time/requires cueing < 10% of the time, More than reasonable amount of time Patient normally able to recall (first 3 days only): Current season, Staff names and faces, That he or she is in a hospital, Location of own room  Medical Problem List and  Plan: 1.Dense right hemiparesis and aphasia/dysphagiasecondary to left large MCA infarction/left M1 occlusion/ACA infarction status post TPA with unsuccessful attempt at mechanical thrombectomy. Status post decompressive hemicraniectomy placement of bone flap abdominal pocket 07/29/2017. Will order protective helmet Continue CIR    Notes reviewed, images reviewed, labs reviewed    WHO ordered 2. DVT Prophylaxis/Anticoagulation: Subcutaneous Lovenox initiated 07/31/2017. Venous Doppler studies negative 3. Pain Management:Tylenol as needed, pain behind left eye discussed with optho, not likely due to pressure on optic nerve as that will cause loss of vision but not pain.  Will treat as HA with topiramate, 25mg  increase to BID  Likely related to his cerebral edema.     Appears to be improving   Repeat CT showed improvement 4. Mood:Provide emotional support.Patient with coping issues due to his diagnosis and physical losses. Social worker to screen. Neuropsychology to be involved as well. Now on Ritalin and Lexapro 5. Neuropsych: This patient isnot completelycapable of making decisions on hisown behalf. 6. Skin/Wound Care:Routine skin checks, skin healing well scalp and abd, s/p staple removal 7. Fluids/Electrolytes/Nutrition:Routine I&O's 8.Dysphagia. Advanced to regular thin diet on 2/7  9.Hypertension.Monitor with increased mobility Vitals:   08/24/17 1500 08/25/17 0453  BP: 120/81 137/81  Pulse: 67 61  Resp: 20 18  Temp: 97.9 F (36.6 C) 98.1 F (36.7 C)  SpO2: 100% 99%    Controlled off meds  on 2/9 10.Hyperlipidemia. Zetia/Lipitor 11. Elevated transanimase likely lipitor but < 3x ULN, monitor 12.  Bladder incontinence resolved 13.  Bowel incontinence resolved  LOS (Days) 18 A FACE TO FACE EVALUATION WAS PERFORMED  Ankit Karis JubaAnil Patel 08/25/2017, 8:52 AM

## 2017-08-25 NOTE — Progress Notes (Signed)
Physical Therapy Session Note  Patient Details  Name: Jeremy [name] MRN: [id number] Date of Birth: Jul 19, 1975  Today's Date: 08/25/2017 PT Individual Time: 1300-1341 PT Individual Time Calculation (min): 41 min   Short Term Goals: Week 3:  PT Short Term Goal 1 (Week 3): Pt will transfer to [facility] with min assist  PT Short Term Goal 2 (Week 3): Pt will propell WC >173f with supervisoin assist consistently  PT Short Term Goal 3 (Week 3): Pt will ambulate 363fwith max assist of 1  PT Short Term Goal 4 (Week 3): Pt will maintain sitting balance with supervision assist up to 10 minutes  PT Short Term Goal 5 (Week 3): Pt will maintain standing balance with min assist and 1 UE support for up to 2 min.   Skilled Therapeutic Interventions/Progress Updates:   Pt in w/c and agreeable to therapy, no c/o pain. Pt self-propelled w/c to therapy gym w/ supervision using L hemi technique. Focused on R NMR this session. Performed multiple sit<>stands from mat table w/ min assist using HW for support in stance. Manual facilitation of lateral weight shifting w/ mod-max assist to block R knee, tactile and manual cues for R quad activation. Performed pre-gait tasks, stepping forward/back w/ LLE, min-mod assist to block R knee. Did the same w/ RLE, however total assist for limb advancement. Performed environment scanning tasks during seated rest breaks requiring pt to scan environment and attend to R side. Performed NuStep 3 min @ L1 to facilitate reciprocal movement pattern, min-mod assist to maintain R knee in neutral alignment, mod assist to initiate movement. Returned to room, ended session in w/c and in care of mother, all needs met.   Therapy Documentation Precautions:  Precautions Precautions: Fall Precaution Comments: hemicraniectomy placement of bone flap abdominal pocket Required Braces or Orthoses: Other Brace/Splint Other Brace/Splint: Helmet when OOB Restrictions Weight Bearing Restrictions: No  See  Function Navigator for Current Functional Status.   Therapy/Group: Individual Therapy  Alfa Leibensperger K Arnette 08/25/2017, 1:42 PM

## 2017-08-26 ENCOUNTER — Inpatient Hospital Stay (HOSPITAL_COMMUNITY): Payer: BLUE CROSS/BLUE SHIELD

## 2017-08-26 DIAGNOSIS — D72829 Elevated white blood cell count, unspecified: Secondary | ICD-10-CM

## 2017-08-26 DIAGNOSIS — D62 Acute posthemorrhagic anemia: Secondary | ICD-10-CM
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
## 2017-08-26 NOTE — Progress Notes (Signed)
Occupational Therapy Session Note  Patient Details  Name: Jeremy GashJao [name] MRN: [id number] Date of Birth: 04-Jul-1976  Today's Date: 08/26/2017 OT Individual Time: [id number] OT Individual Time Calculation (min): 45 min    Short Term Goals: Week 3:  OT Short Term Goal 1 (Week 3): Pt will complete squat pivot transfer R<>L to toilet with min A OT Short Term Goal 2 (Week 3): Pt will maintain static standing balance with L UE support during LB clothing management task (completed total A) in order to reduce caregiver burden OT Short Term Goal 3 (Week 3): Pt will sit EOB to complete dressing task with no more than mod A for dynamic sitting balance  Skilled Therapeutic Interventions/Progress Updates:    1;1. Pt agreeable to bathing and dressing this session. Pt transfers into shower using grab bar with touching A and VC for foot placement, however requires MAX A when exiting shower d/t pivoting to weak R side. Pt bathes at seated level with A to wash R foot and touching A for sitting balance as pt leans to wash buttocks. OT instructed in adaptive washing techniques to scrub LUE/armpit. Pt able to return demonstrate. Seated in w/c pt able to recall hemi dressing technique of threading RLE first with min A and VC to thread sleeve past elbow and visual cue of mirror to push shirt over R shoulder. Pt dresses at sit to stand level with A to bring/maintain RLE into seated figure four to thread underwear, pants and don socks. Pt able to thread LLE into pants, underwear and bring LLE up to don L sock. Pt sit to stand at sink with touching A while OT advances pants past hips. Exited session with pt seated in w/c, call light in reach, QRB donned and all needs in reach.  Therapy Documentation Precautions:  Precautions Precautions: Fall Precaution Comments: hemicraniectomy placement of bone flap abdominal pocket Required Braces or Orthoses: Other Brace/Splint Other Brace/Splint: Helmet when OOB Restrictions Weight  Bearing Restrictions: No  See Function Navigator for Current Functional Status.   Therapy/Group: Individual Therapy  Shon HaleStephanie M Gracelynn Bircher 08/26/2017, 4:50 PM

## 2017-08-27 ENCOUNTER — Inpatient Hospital Stay (HOSPITAL_COMMUNITY): Payer: BLUE CROSS/BLUE SHIELD | Admitting: Speech Pathology

## 2017-08-27 ENCOUNTER — Inpatient Hospital Stay (HOSPITAL_COMMUNITY): Payer: BLUE CROSS/BLUE SHIELD

## 2017-08-27 ENCOUNTER — Inpatient Hospital Stay (HOSPITAL_COMMUNITY): Payer: BLUE CROSS/BLUE SHIELD | Admitting: Physical Therapy

## 2017-08-27 ENCOUNTER — Inpatient Hospital Stay (HOSPITAL_COMMUNITY): Payer: BLUE CROSS/BLUE SHIELD | Admitting: Occupational Therapy

## 2017-08-27 LAB — CBC WITH DIFFERENTIAL/PLATELET
BASOS ABS: 0 10*3/uL (ref 0.0–0.1)
Basophils Relative: 0 %
EOS PCT: 2 %
Eosinophils Absolute: 0.2 10*3/uL (ref 0.0–0.7)
HCT: 39.4 % (ref 39.0–52.0)
Hemoglobin: 12.6 g/dL — ABNORMAL LOW (ref 13.0–17.0)
Lymphocytes Relative: 21 %
Lymphs Abs: 2.1 10*3/uL (ref 0.7–4.0)
MCH: 28.7 pg (ref 26.0–34.0)
MCHC: 32 g/dL (ref 30.0–36.0)
MCV: 89.7 fL (ref 78.0–100.0)
MONO ABS: 0.6 10*3/uL (ref 0.1–1.0)
Monocytes Relative: 6 %
Neutro Abs: 7.1 10*3/uL (ref 1.7–7.7)
Neutrophils Relative %: 71 %
PLATELETS: 214 10*3/uL (ref 150–400)
RBC: 4.39 MIL/uL (ref 4.22–5.81)
RDW: 13.3 % (ref 11.5–15.5)
WBC: 10 10*3/uL (ref 4.0–10.5)

## 2017-08-27 LAB — BASIC METABOLIC PANEL
Anion gap: 12 (ref 5–15)
BUN: 12 mg/dL (ref 6–20)
CO2: 25 mmol/L (ref 22–32)
CREATININE: 1.01 mg/dL (ref 0.61–1.24)
Calcium: 8.8 mg/dL — ABNORMAL LOW (ref 8.9–10.3)
Chloride: 102 mmol/L (ref 101–111)
GFR calc Af Amer: >60 mL/min (ref >60)
GLUCOSE: 113 mg/dL — AB (ref 65–99)
POTASSIUM: 3.6 mmol/L (ref 3.5–5.1)
Sodium: 139 mmol/L (ref 135–145)

## 2017-08-27 NOTE — Progress Notes (Signed)
Physical Therapy Session Note  Patient Details  Name: Jeremy [name] MRN: [id number] Date of Birth: 1975-11-05  Today's Date: 08/27/2017 PT Individual Time: 0915-1015 PT Individual Time Calculation (min): 60 min   Short Term Goals: Week 3:  PT Short Term Goal 1 (Week 3): Pt will transfer to [facility] with min assist  PT Short Term Goal 2 (Week 3): Pt will propell WC >168f with supervisoin assist consistently  PT Short Term Goal 3 (Week 3): Pt will ambulate 344fwith max assist of 1  PT Short Term Goal 4 (Week 3): Pt will maintain sitting balance with supervision assist up to 10 minutes  PT Short Term Goal 5 (Week 3): Pt will maintain standing balance with min assist and 1 UE support for up to 2 min.   Skilled Therapeutic Interventions/Progress Updates: Pt presented in w/c agreeable to therapy. Pt propelled w/c to rehab gym min guard via hemi-technique with intermittent cues for object avoidance/neogotiation on R. Once in gym pt's mother arrived to join session. Discussed with mother comfort level of performing transfers. Pt's mother able to demonstrate squat pivot to mat and per mother pt able to provide some assistance. Pt participated in sit to/from stand x 5 with HW with PTA providing tactile cues for increasing R wt bearing and improving neutral position. Pt indicated pressure for BM. Performed squat pivot transfer to w/c and transported to room. Pt's mother performed stand pivot to toilet with PTA assisting for clothing management (pt with +void no BM). Pt's mother voiced concern for bed mobility once home. After toileting pt returned to w/c in same manner as prior and pt transported back to rehab gym. PTA performed squat pivot to mat modA with verbal cues for sequencing/leaning to facilitate transfer. Blocked practice of supine to sit with tactile cues for increased L trunk activation to facilitate transfer with mod/mazxA. Pt returned to w/c and returned to room with needs met.      Therapy  Documentation Precautions:  Precautions Precautions: Fall Precaution Comments: hemicraniectomy placement of bone flap abdominal pocket Required Braces or Orthoses: Other Brace/Splint Other Brace/Splint: Helmet when OOB Restrictions Weight Bearing Restrictions: No   See Function Navigator for Current Functional Status.   Therapy/Group: Individual Therapy  Jeremy [name]  Ignatz Deis, PTA  08/27/2017, 4:29 PM

## 2017-08-27 NOTE — Progress Notes (Signed)
Occupational Therapy Session Note  Patient Details  Name: Jeremy [name] MRN: [id number] Date of Birth: 12-18-1975  Today's Date: 08/27/2017 OT Individual Time: 1300-1400 OT Individual Time Calculation (min): 60 min    Short Term Goals: Week 3:  OT Short Term Goal 1 (Week 3): Pt will complete squat pivot transfer R<>L to toilet with min A OT Short Term Goal 2 (Week 3): Pt will maintain static standing balance with L UE support during LB clothing management task (completed total A) in order to reduce caregiver burden OT Short Term Goal 3 (Week 3): Pt will sit EOB to complete dressing task with no more than mod A for dynamic sitting balance  Skilled Therapeutic Interventions/Progress Updates:    Pt seen for OT session focusing on ADL re-training, functional transfers, and caregiver education. Pt received sitting on toilet attempting to have BM with mother present. Pt agreeable to tx session. Pt's mother assisted with all toileting aspects as well as transfers throughout session as part of caregiver education. Cuing provided from therapist throughout session regarding proper body mechanics for pt and care giver during transfer, set-up of w/c in prep for transfer, and modified techniques for improved standing balance with hemi walker during clothing management following toileting.  Throughout session, pt completed squat and stand pivot transfers at overall mod A level. Completed w/c>EOB> supine transfer with min A for management of R LE and assist for positioning once in supine. Discussed potential use of hospital bed at d/c, provided education of pros and cons of hospital bed at home, pt's mother unsure if it will fit in their apartment. Extensive discussion during session as well regarding modified ADLs, specifically toileting as pt requires +2 assist currently for toileting due to poor static standing balance. Pt's mother also provided with w/c home measurement sheet to be filled out. Pt's mother feeling  more comfortable assisting pt, however, requests to cont hands on training in prep for planned d/c home.  Pt returned to w/c at end of session, left seated with all needs in reach awaiting hand off to PT.   Therapy Documentation Precautions:  Precautions Precautions: Fall Precaution Comments: hemicraniectomy placement of bone flap abdominal pocket Required Braces or Orthoses: Other Brace/Splint Other Brace/Splint: Helmet when OOB Restrictions Weight Bearing Restrictions: No Pain:   No/denies pain  See Function Navigator for Current Functional Status.   Therapy/Group: Individual Therapy  [place] Nabor L 08/27/2017, 7:16 AM

## 2017-08-27 NOTE — Progress Notes (Signed)
Pt refused dulcolax suppos this morning. Risks/benefits reviewed.

## 2017-08-27 NOTE — Progress Notes (Signed)
Speech Language Pathology Daily Session Note  Patient Details  Name: Jeremy [name] MRN: [id number] Date of Birth: 1975-12-01  Today's Date: 08/27/2017 SLP Individual Time: 0815-0900 SLP Individual Time Calculation (min): 45 min  Short Term Goals: Week 3: SLP Short Term Goal 1 (Week 3): Pt will consume trials of regular textures without overt s/s of aspiration and supervision cuesto demonstrate readiness for diet upgrade. SLP Short Term Goal 1 - Progress (Week 3): Met SLP Short Term Goal 2 (Week 3): Given Mod A cues, pt will scan to right of midline to locate objects during semi-complex tasks.  SLP Short Term Goal 3 (Week 3): Pt will complete semi-complex tasks with supervision cues.  SLP Short Term Goal 4 (Week 3): Pt will demonstrate task initiation with Min A cues in 80% of opportunities.  SLP Short Term Goal 5 (Week 3): Pt will demonstrate intellectual awareness by identifying 2 physical and 2 cognitive deficits with Min A cues.  SLP Short Term Goal 6 (Week 3): Pt will demonstrate selective attention in moderately distracting environment for ~ 45 minutes with MIn A cues.   Skilled Therapeutic Interventions:Skilled treatment session focused on recall of previously learned card game (Speed). Pt required Mod A to recall and Min A to complete initial set up. Pt also required more than a reasonable amount of time complete plays within game. Overall, pt appeared slightly fatigued by "trying to get himself going for the day." Pt returned to room, left upright in wheelchair, right wrist in brace, right arm in sling with safety belt donned and all needs within reach. Continue per current plan of care.      Function:    Cognition Comprehension Comprehension assist level: Follows complex conversation/direction with extra time/assistive device  Expression   Expression assist level: Expresses complex 90% of the time/cues < 10% of the time;Expresses complex ideas: With extra time/assistive device   Social Interaction Social Interaction assist level: Interacts appropriately 90% of the time - Needs monitoring or encouragement for participation or interaction.;Interacts appropriately with others with medication or extra time (anti-anxiety, antidepressant).  Problem Solving Problem solving assist level: Solves complex 90% of the time/cues < 10% of the time  Memory Memory assist level: More than reasonable amount of time    Pain    Therapy/Group: Individual Therapy  Kennon Encinas 08/27/2017, 11:37 AM

## 2017-08-27 NOTE — Progress Notes (Signed)
Physical Therapy Session Note  Patient Details  Name: Jeremy [name] MRN: [id number] Date of Birth: 1975/12/18  Today's Date: 08/27/2017 PT Individual Time: [id number] PT Individual Time Calculation (min): 32 min   Short Term Goals: Week 3:  PT Short Term Goal 1 (Week 3): [facility]  PT Short Term Goal 2 (Week 3): Pt will propell WC >14150ft with supervisoin assist consistently  PT Short Term Goal 3 (Week 3): Pt will ambulate 630ft with max assist of 1  PT Short Term Goal 4 (Week 3): Pt will maintain sitting balance with supervision assist up to 10 minutes  PT Short Term Goal 5 (Week 3): Pt will maintain standing balance with min assist and 1 UE support for up to 2 min.   Skilled Therapeutic Interventions/Progress Updates:    w/c mobility retraining using hemi technique with max cues for encouragement, min cues for obstacle negotiation. Pt utilized combination of LLE and LLE/LUE. NMR to address postural control, balance, weightshifting, and weightbearing through RUE/RLE during functional transfers, sit <.> stands, and standing with RW and R hand orthosis. Pt performed min assist transfer to the R to the mat and mod assist to L back to the w/c stand pivot technique with cues for hand placement and tactile facilitation for weightshifting. After discussion with primary OT for problem solving functional standing balance for clothing management, trial with RW with R hand orthosis. Using mirror for visual feedback for body awareness to reorient to midline and increase awareness to postural deficits, engaged in sit <> stands (min assist for sit <> stand and total assist for placement of RUE on orthosis while maintaining balance with min assist) and weightshifting and forced weightbearing through RUE/RLE in standing positions. Pt able to take small step forward and back with LLE with PT providing facilitation at R quad in stance and focused on repositioning of shoulders and trunk for  reorienting to midline (pt with tendency for R rotation) with overall min assist for balance. Would benefit from continued practice to force functional weightbearing through RUE using RW during standing activities.   Therapy Documentation Precautions:  Precautions Precautions: Fall Precaution Comments: hemicraniectomy placement of bone flap abdominal pocket Required Braces or Orthoses: Other Brace/Splint Other Brace/Splint: Helmet when OOB Restrictions Weight Bearing Restrictions: No Pain: Pain Assessment Pain Assessment: No/denies pain   See Function Navigator for Current Functional Status.   Therapy/Group: Individual Therapy  Karolee StampsGray, [place] Tally Darrol PokeBrescia  Jeremy Kitner B. Dynasti Kerman, PT, DPT  08/27/2017, 2:56 PM

## 2017-08-27 NOTE — Progress Notes (Signed)
Subjective/Complaints: Patient in bed.  Reports no new problems.  Denies pain  ROS: Limited due to cognitive/behavioral    Objective: Vital Signs: Blood pressure (!) 137/95, pulse 83, temperature 99.2 F (37.3 C), temperature source Oral, resp. rate 18, weight 84.5 kg (186 lb 4.6 oz), SpO2 98 %. No results found. Results for orders placed or performed during the hospital encounter of 08/07/17 (from the past 72 hour(s))  Basic metabolic panel     Status: Abnormal   Collection Time: 08/27/17  5:22 AM  Result Value Ref Range   Sodium 139 135 - 145 mmol/L   Potassium 3.6 3.5 - 5.1 mmol/L   Chloride 102 101 - 111 mmol/L   CO2 25 22 - 32 mmol/L   Glucose, Bld 113 (H) 65 - 99 mg/dL   BUN 12 6 - 20 mg/dL   Creatinine, Ser 1.01 0.61 - 1.24 mg/dL   Calcium 8.8 (L) 8.9 - 10.3 mg/dL   GFR calc non Af Amer >60 >60 mL/min   GFR calc Af Amer >60 >60 mL/min    Comment: (NOTE) The eGFR has been calculated using the CKD EPI equation. This calculation has not been validated in all clinical situations. eGFR's persistently <60 mL/min signify possible Chronic Kidney Disease.    Anion gap 12 5 - 15    Comment: Performed at [facility] [address], [place], [place] [address]  CBC with Differential/Platelet     Status: Abnormal   Collection Time: 08/27/17  5:22 AM  Result Value Ref Range   WBC 10.0 4.0 - 10.5 K/uL   RBC 4.39 4.22 - 5.81 MIL/uL   Hemoglobin 12.6 (L) 13.0 - 17.0 g/dL   HCT 39.4 39.0 - 52.0 %   MCV 89.7 78.0 - 100.0 fL   MCH 28.7 26.0 - 34.0 pg   MCHC 32.0 30.0 - 36.0 g/dL   RDW 13.3 11.5 - 15.5 %   Platelets 214 150 - 400 K/uL   Neutrophils Relative % 71 %   Neutro Abs 7.1 1.7 - 7.7 K/uL   Lymphocytes Relative 21 %   Lymphs Abs 2.1 0.7 - 4.0 K/uL   Monocytes Relative 6 %   Monocytes Absolute 0.6 0.1 - 1.0 K/uL   Eosinophils Relative 2 %   Eosinophils Absolute 0.2 0.0 - 0.7 K/uL   Basophils Relative 0 %   Basophils Absolute 0.0 0.0 - 0.1 K/uL    Comment:  Performed at [facility] [address], [place], [place] [address]     HEENT: scalp incision CDI, mild ptosis left eye Cardio: RRR without murmur. No JVD  Resp: CTA Bilaterally without wheezes or rales. Normal effort  GI: BS positive and RLQ incision CDI, no abd denterness Musculoskeletal:  No Edema. No tenderness Skin:   Wound C/D/I and abd and scalp Neuro: Alert/Oriented,  Motor: Right upper extremity/right lower extremity: 0/5 proximal to distal (stable) Sensation absent to light touch right upper extremity/right lower extremity Tone:  Increased R pec, wrist flexors  clonus R ankle  Gen NAD. Vital signs reviewed.  Assessment/Plan: 1. Functional deficits secondary to Left MCA infarct which require 3+ hours per day of interdisciplinary therapy in a comprehensive inpatient rehab setting. Physiatrist is providing close team supervision and 24 hour management of active medical problems listed below. Physiatrist and rehab team continue to assess barriers to discharge/monitor patient progress toward functional and medical goals. FIM: Function - Bathing Position: Shower Body parts bathed by patient: Right arm, Left arm, Chest,  Abdomen, Front perineal area, Buttocks, Right upper leg, Left upper leg, Left lower leg Body parts bathed by helper: Right lower leg, Back Assist Level: Touching or steadying assistance(Pt > 75%)  Function- Upper Body Dressing/Undressing What is the patient wearing?: Pull over shirt/dress Pull over shirt/dress - Perfomed by patient: Thread/unthread left sleeve, Put head through opening, Pull shirt over trunk, Thread/unthread right sleeve Pull over shirt/dress - Perfomed by helper: Thread/unthread right sleeve Assist Level: Touching or steadying assistance(Pt > 75%) Function - Lower Body Dressing/Undressing What is the patient wearing?: Underwear, Pants, Socks Position: Wheelchair/chair at sink Underwear - Performed by patient: Thread/unthread left  underwear leg Underwear - Performed by helper: Pull underwear up/down, Thread/unthread right underwear leg(pt able to thread RLE if held in seated figure 4) Pants- Performed by patient: Thread/unthread left pants leg Pants- Performed by helper: Pull pants up/down, Thread/unthread right pants leg(pt able to thread RLE if held in seated figure ) Non-skid slipper socks- Performed by patient: Don/doff left sock Non-skid slipper socks- Performed by helper: Don/doff right sock Socks - Performed by patient: Don/doff right sock, Don/doff left sock Socks - Performed by helper: Don/doff right sock Shoes - Performed by patient: Don/doff right shoe, Don/doff left shoe Shoes - Performed by helper: Fasten right, Fasten left Assist for footwear: Partial/moderate assist Assist for lower body dressing: Touching or steadying assistance (Pt > 75%)  Function - Toileting Toileting activity did not occur: No continent bowel/bladder event Toileting steps completed by helper: Adjust clothing prior to toileting, Performs perineal hygiene, Adjust clothing after toileting Toileting Assistive Devices: Grab bar or rail Assist level: Two helpers  Function - Air cabin crew transfer activity did not occur: Safety/medical concerns Toilet transfer assistive device: Radio broadcast assistant lift: Stedy Assist level to toilet: 2 helpers Assist level from toilet: 2 helpers Assist level to bedside commode (at bedside): 2 helpers Assist level from bedside commode (at bedside): 2 helpers  Function - Chair/bed transfer Chair/bed transfer [place]: Stand pivot Chair/bed transfer assist level: Moderate assist (Pt 50 - 74%/lift or lower) Chair/bed transfer assistive device: Armrests Mechanical lift: Stedy Chair/bed transfer details: Manual facilitation for placement, Manual facilitation for weight bearing, Verbal cues for technique, Verbal cues for sequencing, Manual facilitation for weight shifting, Verbal cues for safe use  of DME/AE  Function - Locomotion: Wheelchair Will patient use wheelchair at discharge?: Yes Type: Manual Max wheelchair distance: 16f  Assist Level: Supervision or verbal cues Assist Level: Supervision or verbal cues Assist Level: Supervision or verbal cues Turns around,maneuvers to table,bed, and toilet,negotiates 3% grade,maneuvers on rugs and over doorsills: No Function - Locomotion: Ambulation Ambulation activity did not occur: Safety/medical concerns Assistive device: Walker-hemi Max distance: 439f Assist level: Maximal assist (Pt 25 - 49%) Walk 10 feet activity did not occur: Safety/medical concerns Assist level: Maximal assist (Pt 25 - 49%) Walk 50 feet with 2 turns activity did not occur: Safety/medical concerns Assist level: 2 helpers Walk 10 feet on uneven surfaces activity did not occur: Safety/medical concerns  Function - Comprehension Comprehension: Auditory Comprehension assist level: Understands basic 90% of the time/cues < 10% of the time  Function - Expression Expression: Verbal Expression assist level: Expresses basic 90% of the time/requires cueing < 10% of the time.  Function - Social Interaction Social Interaction assist level: Interacts appropriately 90% of the time - Needs monitoring or encouragement for participation or interaction.  Function - Problem Solving Problem solving assist level: Solves basic 90% of the time/requires cueing < 10% of the time  Function - Memory Memory assist level: Recognizes or recalls 90% of the time/requires cueing < 10% of the time Patient normally able to recall (first 3 days only): Current season, Staff names and faces, That he or she is in a hospital, Location of own room  Medical Problem List and Plan: 1.Dense right hemiparesis and aphasia/dysphagiasecondary to left large MCA infarction/left M1 occlusion/ACA infarction status post TPA with unsuccessful attempt at mechanical thrombectomy. Status post decompressive  hemicraniectomy placement of bone flap abdominal pocket 07/29/2017. Will order protective helmet Continue CIR    WHO in place  2. DVT Prophylaxis/Anticoagulation: Subcutaneous Lovenox initiated 07/31/2017. Venous Doppler studies negative 3. Pain Management:Tylenol as needed, pain behind left eye discussed with optho, not likely due to pressure on optic nerve as that will cause loss of vision but not pain.  Treating HA with topiramate, 57m increase to BID  Likely related to his cerebral edema.     Improved control   Repeat CT showed improvement 4. Mood:Provide emotional support.Patient with coping issues due to his diagnosis and physical losses. Social worker to screen. Neuropsychology to be involved as well. Now on Ritalin and Lexapro 5. Neuropsych: This patient isnot completelycapable of making decisions on hisown behalf. 6. Skin/Wound Care:Routine skin checks, skin healing well scalp and abd, s/p staple removal 7. Fluids/Electrolytes/Nutrition:Routine I&O's   Labs all reviewed this morning and within normal limits 8.Dysphagia. Advanced to regular thin diet on 2/7  9.Hypertension.Monitor with increased mobility Vitals:   08/26/17 1522 08/27/17 0604  BP: (!) 139/92 (!) 137/95  Pulse: 96 83  Resp: 20 18  Temp: 99.3 F (37.4 C) 99.2 F (37.3 C)  SpO2: 100% 98%    Keep an eye on diastolic pressures 10.Hyperlipidemia. Zetia/Lipitor 11. Elevated transanimase likely lipitor but < 3x ULN, monitor 12.  Bladder incontinence resolved 13.  Bowel incontinence resolved.  14. Leukocytosis   WBCs 12.3 on 1/23   Afebrile   White blood cells down to 10.0 on 08/27/2017 15. Acute blood loss anemia   Hemoglobin 11.7 on 1/23   Hemoglobin 12.6 on 08/27/2017  LOS (Days) 20 A FACE TO FACE EVALUATION WAS PERFORMED  Lizza Huffaker T 08/27/2017, 8:47 AM

## 2017-08-28 ENCOUNTER — Inpatient Hospital Stay (HOSPITAL_COMMUNITY): Payer: BLUE CROSS/BLUE SHIELD

## 2017-08-28 ENCOUNTER — Encounter (HOSPITAL_COMMUNITY): Payer: BLUE CROSS/BLUE SHIELD | Admitting: Psychology

## 2017-08-28 ENCOUNTER — Inpatient Hospital Stay (HOSPITAL_COMMUNITY): Payer: BLUE CROSS/BLUE SHIELD | Admitting: *Deleted

## 2017-08-28 ENCOUNTER — Inpatient Hospital Stay (HOSPITAL_COMMUNITY): Payer: BLUE CROSS/BLUE SHIELD | Admitting: Occupational Therapy

## 2017-08-28 NOTE — Plan of Care (Signed)
  Progressing Consults RH STROKE PATIENT EDUCATION Description See Patient Education module for education specifics  08/28/2017 0216 - Progressing by Marigene EhlersGonzalez, Ereka Brau, RN RH BOWEL ELIMINATION RH STG MANAGE BOWEL WITH ASSISTANCE Description STG Manage Bowel with mod Assistance.  08/28/2017 0216 - Progressing by Marigene EhlersGonzalez, Sereniti Wan, RN RH STG MANAGE BOWEL W/MEDICATION W/ASSISTANCE Description STG Manage Bowel with Medication and min Assistance.  08/28/2017 0216 - Progressing by Marigene EhlersGonzalez, Charnelle Bergeman, RN RH BLADDER ELIMINATION RH STG MANAGE BLADDER WITH ASSISTANCE Description STG Manage Bladder With mod Assistance  08/28/2017 0216 - Progressing by Marigene EhlersGonzalez, Walther Sanagustin, RN RH SKIN INTEGRITY RH STG SKIN FREE OF INFECTION/BREAKDOWN Description Skin to remain free from infection and breakdown while on rehab with max assist.  08/28/2017 0216 - Progressing by Marigene EhlersGonzalez, Lorimer Tiberio, RN RH STG MAINTAIN SKIN INTEGRITY WITH ASSISTANCE Description STG Maintain Skin Integrity With max Assistance.  08/28/2017 0216 - Progressing by Marigene EhlersGonzalez, Jamel Dunton, RN RH STG ABLE TO PERFORM INCISION/WOUND CARE W/ASSISTANCE Description STG Able To Perform Incision or Wound Care With total Assistance.  08/28/2017 0216 - Progressing by Marigene EhlersGonzalez, Caven Perine, RN RH SAFETY RH STG ADHERE TO SAFETY PRECAUTIONS W/ASSISTANCE/DEVICE Description STG Adhere to Safety Precautions With max Assistance and appropriate assistive Device.  08/28/2017 0216 - Progressing by Marigene EhlersGonzalez, Montario Zilka, RN RH STG DECREASED RISK OF FALL WITH ASSISTANCE Description STG Decreased Risk of Fall With mod Assistance.  08/28/2017 0216 - Progressing by Marigene EhlersGonzalez, Maxi Carreras, RN RH PAIN MANAGEMENT RH STG PAIN MANAGED AT OR BELOW PT'S PAIN GOAL Description <3 on a 0-10 pain scale  08/28/2017 0216 - Progressing by Marigene EhlersGonzalez, Kadyn Guild, RN

## 2017-08-28 NOTE — Progress Notes (Signed)
Subjective/Complaints:  Had BM this am Increasing RLE shaking  ROS: Limited due to cognitive/behavioral    Objective: Vital Signs: Blood pressure (!) 140/92, pulse (!) 103, temperature 99.1 F (37.3 C), temperature source Oral, resp. rate 16, weight 84.5 kg (186 lb 4.6 oz), SpO2 99 %. No results found. Results for orders placed or performed during the hospital encounter of 08/07/17 (from the past 72 hour(s))  Basic metabolic panel     Status: Abnormal   Collection Time: 08/27/17  5:22 AM  Result Value Ref Range   Sodium 139 135 - 145 mmol/L   Potassium 3.6 3.5 - 5.1 mmol/L   Chloride 102 101 - 111 mmol/L   CO2 25 22 - 32 mmol/L   Glucose, Bld 113 (H) 65 - 99 mg/dL   BUN 12 6 - 20 mg/dL   Creatinine, Ser 1.01 0.61 - 1.24 mg/dL   Calcium 8.8 (L) 8.9 - 10.3 mg/dL   GFR calc non Af Amer >60 >60 mL/min   GFR calc Af Amer >60 >60 mL/min    Comment: (NOTE) The eGFR has been calculated using the CKD EPI equation. This calculation has not been validated in all clinical situations. eGFR's persistently <60 mL/min signify possible Chronic Kidney Disease.    Anion gap 12 5 - 15    Comment: Performed at [facility] [address], [place], [place] [address]  CBC with Differential/Platelet     Status: Abnormal   Collection Time: 08/27/17  5:22 AM  Result Value Ref Range   WBC 10.0 4.0 - 10.5 K/uL   RBC 4.39 4.22 - 5.81 MIL/uL   Hemoglobin 12.6 (L) 13.0 - 17.0 g/dL   HCT 39.4 39.0 - 52.0 %   MCV 89.7 78.0 - 100.0 fL   MCH 28.7 26.0 - 34.0 pg   MCHC 32.0 30.0 - 36.0 g/dL   RDW 13.3 11.5 - 15.5 %   Platelets 214 150 - 400 K/uL   Neutrophils Relative % 71 %   Neutro Abs 7.1 1.7 - 7.7 K/uL   Lymphocytes Relative 21 %   Lymphs Abs 2.1 0.7 - 4.0 K/uL   Monocytes Relative 6 %   Monocytes Absolute 0.6 0.1 - 1.0 K/uL   Eosinophils Relative 2 %   Eosinophils Absolute 0.2 0.0 - 0.7 K/uL   Basophils Relative 0 %   Basophils Absolute 0.0 0.0 - 0.1 K/uL    Comment: Performed at  [facility] [address] [place], [place] [address]     HEENT: scalp incision CDI, mild ptosis left eye Cardio: RRR without murmur. No JVD  Resp: CTA Bilaterally without wheezes or rales. Normal effort  GI: BS positive and RLQ incision CDI, no abd denterness Musculoskeletal:  No Edema. No tenderness Skin:   Wound C/D/I and abd and scalp Neuro: Alert/Oriented,  Motor: Right upper extremity/right lower extremity: 0/5 proximal to distal (stable) Sensation absent to light touch right upper extremity/right lower extremity Tone:  Increased R pec, wrist flexors  clonus R ankle  Gen NAD. Vital signs reviewed.  Assessment/Plan: 1. Functional deficits secondary to Left MCA infarct which require 3+ hours per day of interdisciplinary therapy in a comprehensive inpatient rehab setting. Physiatrist is providing close team supervision and 24 hour management of active medical problems listed below. Physiatrist and rehab team continue to assess barriers to discharge/monitor patient progress toward functional and medical goals. FIM: Function - Bathing Position: Shower Body parts bathed by patient: Right arm, Left arm, Chest, Abdomen, Front  perineal area, Buttocks, Right upper leg, Left upper leg, Left lower leg Body parts bathed by helper: Right lower leg, Back Assist Level: Touching or steadying assistance(Pt > 75%)  Function- Upper Body Dressing/Undressing What is the patient wearing?: Pull over shirt/dress Pull over shirt/dress - Perfomed by patient: Thread/unthread left sleeve, Put head through opening, Pull shirt over trunk, Thread/unthread right sleeve Pull over shirt/dress - Perfomed by helper: Thread/unthread right sleeve Assist Level: Touching or steadying assistance(Pt > 75%) Function - Lower Body Dressing/Undressing What is the patient wearing?: Underwear, Pants, Socks Position: Wheelchair/chair at sink Underwear - Performed by patient: Thread/unthread left underwear  leg Underwear - Performed by helper: Pull underwear up/down, Thread/unthread right underwear leg(pt able to thread RLE if held in seated figure 4) Pants- Performed by patient: Thread/unthread left pants leg Pants- Performed by helper: Pull pants up/down, Thread/unthread right pants leg(pt able to thread RLE if held in seated figure ) Non-skid slipper socks- Performed by patient: Don/doff left sock Non-skid slipper socks- Performed by helper: Don/doff right sock Socks - Performed by patient: Don/doff right sock, Don/doff left sock Socks - Performed by helper: Don/doff right sock Shoes - Performed by patient: Don/doff right shoe, Don/doff left shoe Shoes - Performed by helper: Fasten right, Fasten left Assist for footwear: Partial/moderate assist Assist for lower body dressing: Touching or steadying assistance (Pt > 75%)  Function - Toileting Toileting activity did not occur: No continent bowel/bladder event Toileting steps completed by helper: Adjust clothing prior to toileting, Performs perineal hygiene, Adjust clothing after toileting Toileting Assistive Devices: Grab bar or rail Assist level: Two helpers  Function - Air cabin crew transfer activity did not occur: Safety/medical concerns Toilet transfer assistive device: Radio broadcast assistant lift: Stedy Assist level to toilet: Moderate assist (Pt 50 - 74%/lift or lower) Assist level from toilet: Moderate assist (Pt 50 - 74%/lift or lower) Assist level to bedside commode (at bedside): 2 helpers Assist level from bedside commode (at bedside): 2 helpers  Function - Chair/bed transfer Chair/bed transfer method: Stand pivot Chair/bed transfer assist level: Moderate assist (Pt 50 - 74%/lift or lower) Chair/bed transfer assistive device: Armrests, Orthosis Mechanical lift: Stedy Chair/bed transfer details: Manual facilitation for placement, Manual facilitation for weight bearing, Verbal cues for technique, Verbal cues for  sequencing, Manual facilitation for weight shifting, Verbal cues for safe use of DME/AE  Function - Locomotion: Wheelchair Will patient use wheelchair at discharge?: Yes Type: Manual Max wheelchair distance: 120' Assist Level: Supervision or verbal cues Assist Level: Supervision or verbal cues Assist Level: Supervision or verbal cues Turns around,maneuvers to table,bed, and toilet,negotiates 3% grade,maneuvers on rugs and over doorsills: No Function - Locomotion: Ambulation Ambulation activity did not occur: Safety/medical concerns Assistive device: Walker-hemi Max distance: 62f  Assist level: Maximal assist (Pt 25 - 49%) Walk 10 feet activity did not occur: Safety/medical concerns Assist level: Maximal assist (Pt 25 - 49%) Walk 50 feet with 2 turns activity did not occur: Safety/medical concerns Assist level: 2 helpers Walk 10 feet on uneven surfaces activity did not occur: Safety/medical concerns  Function - Comprehension Comprehension: Auditory Comprehension assist level: Understands complex 90% of the time/cues 10% of the time  Function - Expression Expression: Verbal Expression assist level: Expresses complex 90% of the time/cues < 10% of the time  Function - Social Interaction Social Interaction assist level: Interacts appropriately 90% of the time - Needs monitoring or encouragement for participation or interaction.  Function - Problem Solving Problem solving assist level: Solves basic problems with no  assist  Function - Memory Memory assist level: More than reasonable amount of time, Recognizes or recalls 90% of the time/requires cueing < 10% of the time Patient normally able to recall (first 3 days only): Current season, Staff names and faces, That he or she is in a hospital, Location of own room  Medical Problem List and Plan: 1.Dense right hemiparesis and aphasia/dysphagiasecondary to left large MCA infarction/left M1 occlusion/ACA infarction status post TPA  with unsuccessful attempt at mechanical thrombectomy. Status post decompressive hemicraniectomy placement of bone flap abdominal pocket 07/29/2017. Will order protective helmet Continue CIR    WHO in place  2. DVT Prophylaxis/Anticoagulation: Subcutaneous Lovenox initiated 07/31/2017. Venous Doppler studies negative 3. Pain Management:Tylenol as needed, pain behind left eye discussed with optho, not likely due to pressure on optic nerve as that will cause loss of vision but not pain.  Treating HA with topiramate, 47m increase to BID  Likely related to his cerebral edema.        Repeat CT showed improvement 4. Mood:Provide emotional support.Patient with coping issues due to his diagnosis and physical losses. Social worker to screen. Neuropsychology to be involved as well. Now on Ritalin and Lexapro 5. Neuropsych: This patient isnot completelycapable of making decisions on hisown behalf. 6. Skin/Wound Care:Routine skin checks, skin healing well scalp and abd, s/p staple removal 7. Fluids/Electrolytes/Nutrition:Routine I&O's   Labs all reviewed this morning and within normal limits 8.Dysphagia. Advanced to regular thin diet on 2/7  9.Hypertension.Monitor with increased mobility Vitals:   08/27/17 1300 08/28/17 0308  BP: 129/89 (!) 140/92  Pulse: 85 (!) 103  Resp: 18 16  Temp: 99 F (37.2 C) 99.1 F (37.3 C)  SpO2: 99% 99%    Control is overall in good range 10.Hyperlipidemia. Zetia/Lipitor 11. Elevated transanimase likely lipitor but < 3x ULN, monitor 12.  Bladder incontinence resolved 13.  Bowel incontinence resolved.  14. Leukocytosis   WBCs 12.3 on 1/23   Afebrile   White blood cells down to 10.0 on 08/27/2017 15. Acute blood loss anemia   Hemoglobin 11.7 on 1/23   Hemoglobin 12.6 on 08/27/2017  LOS (Days) 21 A FACE TO FACE EVALUATION WAS PERFORMED  ACharlett Blake2/06/2018, 7:43 AM

## 2017-08-28 NOTE — Progress Notes (Signed)
Physical Therapy Session Note  Patient Details  Name: Jeremy [name] MRN: [id number] Date of Birth: 1976/07/03  Today's Date: 08/28/2017 PT Individual Time: 1010-1105 PT Individual Time Calculation (min): 55 min   Short Term Goals: Week 3:  PT Short Term Goal 1 (Week 3): Pt will transfer to [facility] [address] with min assist  PT Short Term Goal 2 (Week 3): Pt will propell WC >198f with supervisoin assist consistently  PT Short Term Goal 3 (Week 3): Pt will ambulate 365fwith max assist of 1  PT Short Term Goal 4 (Week 3): Pt will maintain sitting balance with supervision assist up to 10 minutes  PT Short Term Goal 5 (Week 3): Pt will maintain standing balance with min assist and 1 UE support for up to 2 min.   Skilled Therapeutic Interventions/Progress Updates: Pt presented in w/c agreeable to therapy, mom arrived to participate in session shortly thereafter. Session focused on RNMR via gait training and bed mobility. Pt propelled w/c to rehab gym hallway with min cues for obstacles avoidance. Pt able to correct with min cues. Pt participated in gait training with HW [id number] 2 with maxA and PTA providing total assist for RLE advance with +1 for w/c follow. PTA providing max multimodal cues for increasing wt shift to R for advancing LLE and PTA blocking RLE in stance phase. Pt performed squat pivot to mat to R with modA and sit to supine mod/maxA. Pt participated in [place] manual facilitation for trunk activation/elongation. In hook-lying position pt able to rotate BLE to L and use LLE to move RLE off mat with 1 step commands for sequencing. Pt required increased time to initiate using LUE to push off mat. Pt able to perform sidelying to sitting with min to modA. Performed squat pivot to L to return to w/c minA. Pt then handed off to mom to return to room with immediate needs met.   Therapy Documentation Precautions:  Precautions Precautions: Fall Precaution Comments: hemicraniectomy placement of bone flap  abdominal pocket Required Braces or Orthoses: Other Brace/Splint Other Brace/Splint: Helmet when OOB Restrictions Weight Bearing Restrictions: No   See Function Navigator for Current Functional Status.   Therapy/Group: Individual Therapy  Lakasha Mcfall  Ingeborg Fite, PTA  08/28/2017, 12:17 PM

## 2017-08-28 NOTE — Consult Note (Signed)
Neuropsychological Consultation   Patient:   Jeremy [name]   DOB:   1976/06/27  MR Number:  [id number]  Location:  [facility] [id number] [place] [address] Dept: [phone] Loc: [phone]           Date of Service:   08/28/2017  Start Time:   2 PM End Time:   3 PM  Provider/Observer:  Arley Phenix, Psy.D.       Clinical Neuropsychologist       Billing Code/Service: [phone] 4 Units  Chief Complaint:    Jeremy [name] is a 42 year old male with history of hypertension but no antihypertensive medications.  Presented 07/27/2017 with right sided weakness, headache, facial droop and slurred speech.  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  Cerebral edema with midline shift developed.  Decompressive hemicraniectomy performed.  Patient has shown improving aphasia/expressive language symptoms and near complete right arm and right leg motor impairments.  Impulse control (almost completely verbal in nature) is noted.  Verbally resistant and negative interactions during some therapy sessions noted.  Receptive language functions appear to be intact.  Depressive and anxiety symptoms also present due to sudden loss of function.  Reason for Service:  [name] was referred for neuropsychological/psycholoigcal consultation due to coping and adjustment issues and issues/concerns about motivation and verbal resistance to therapy and instructions.  Below is the HPI for the current admission.  HPI:Jeremy [name] a 42 y.o.right handed malewith history of hypertension on no antihypertensive medications. Presented 07/27/2017 with right-sided weakness, headache,facial droop and slurred speech. Per chart review patient lives with spouse.They have a 51-year-old son as well as wife is currently pregnant.One level home/thirdthird floor apartmentand they are currently seeking an apartment ground-level.Good  family support in the area.Independent and working prior to admission. Cranial CT scan showed left insula and frontal operculum lucency compatible with infarction.echocardiogram with ejection fraction of 65% no wall motion abnormalities. No defect or PFO identified. CT Angiography head and neck showed left M1 occlusion with intermediate left MCA distribution collateralization. Underwentattemptedmechanical thrombectomy per interventional radiologywhichwas unsuccessful and required intubation. Follow-up MRI showed cerebral edema with midline shift maintained on 3% saline. Underwent left decompressive hemicraniectomy placement of bone flap abdominal pocket 07/29/2017 per Dr.Nundkumar.Initially maintained onCleviprex.Patient was extubated 06/02/2018 NPOwith tube feeds for nutritional supportand diet advanced to a dysphagia #1 nectar thick liquid. Follow-up CT 08/03/2017 stable no new changes.Bilateral lower extremity Dopplers negative.TEE was attempted 08/06/2017 but unable to perform.No plan for loop recorder.Subcutaneous Lovenox was later initiated for DVT prophylaxis. Physicaland occupationaltherapy evaluationscompletedwithrecommendations of physical medicine rehabilitation consult.Patient was admitted for a comprehensive rehabilitation program  Current Status:  Reports from the family and treatment team members all suggest that the patient is doing much better as far as his mood and interaction with others.  He has been less critical of family members.  I did talk to both his wife as well as his sister-in-law about prior personality states.  They reports that while he is always been very sarcastic and cutting in his comments they were not nothing like the negativity that he had been verbalizing more recently.  The patient is being much more appropriate in his interaction with his wife, mother, and son.  The patient does report that he feels a little bit more down recently but denies  depression and reports that he feels like it is just frustration with his loss of function as he becomes more aware.  There have been some concerns about observing slowed response time and slowed information processing speed.  They have also been some concerns about recent bladder accidents at night while sleeping.  The report that this is a relatively new symptom.  The patient also reports that he is having more muscle spasms on the left side of his body.  The patient continues to report regular headaches.   Behavioral Observation: [name]  presents as a 42 y.o.-year-old Right handed Male who appeared his stated age. his dress was Appropriate and he was Well Groomed and his manners were Appropriate, inappropriate at times to the situation.  his participation was indicative of Appropriate, Redirectable and Resistant behaviors.  There were any physical disabilities noted.  he displayed an appropriate level of cooperation and motivation.     Interactions:    Active Appropriate and Redirectable  Attention:   abnormal and attention span appeared shorter than expected for age  Memory:   abnormal; remote memory intact, recent memory impaired  Visuo-spatial:  within normal limits  Speech (Volume):  low  Speech:   slurred;   Thought Process:  Coherent and Relevant  Though Content:  WNL; not suicidal  Orientation:   person, place, time/date and situation  Judgment:   Poor  Planning:   Poor  Affect:    Anxious, Defensive and Depressed  Mood:    Anxious, Depressed and Irritable  Insight:   Shallow  Intelligence:   normal  Marital Status/Living: The patient is married and has a 32 year old son and his wife is pregnant (due in April) as will as having his mother come down from [place] to help for a period of time.   Medical History:   Past Medical History:  Diagnosis Date  . Anxiety   . Asthma    MILD AS A CHILD  . Depression   . Deviated septum   . Dyspnea    NORMAL SPIROMETRY 02/01/17  VISIT WITH DR HEDRICK  . Hypertension   . Sleep apnea         Psychiatric History:  Patient has prior diagnosis of both depression and anxiety in the past.  Family Med/Psych History: No family history on file.  Risk of Suicide/Violence: low At this point the patient has severe impairments in right side motor functioning.  While he can be very negative verbally and even threaten physical assault upon others or be verbally critical, this is likely more of his prior behavioral patterns magnified following left frontal lobe brain injury.    Impression/DX:  Gerren Hoffmeier is a 42 year old male with history of hypertension but no antihypertensive medications.  Presented 07/27/2017 with right sided weakness, headache, facial droop and slurred speech.  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  Cerebral edema with midline shift developed.  Decompressive hemicraniectomy performed.  Patient has shown improving aphasia/expressive language symptoms and near complete right arm and right leg motor impairments.  Impulse control (almost completely verbal in nature) is noted.  Verbally resistant and negative interactions during some therapy sessions noted.  Receptive language functions appear to be intact.  Depressive and anxiety symptoms also present due to sudden loss of function.  Reports from the family and treatment team members all suggest that the patient is doing much better as far as his mood and interaction with others.  He has been less critical of family members.  I did talk to both his wife as well as his sister-in-law about prior personality states.  They  reports that while he is always been very sarcastic and cutting in his comments they were not nothing like the negativity that he had been verbalizing more recently.  The patient is being much more appropriate in his interaction with his wife, mother, and son.  The patient does report that he feels a little bit more down recently but denies  depression and reports that he feels like it is just frustration with his loss of function as he becomes more aware.  There have been some concerns about observing slowed response time and slowed information processing speed.  They have also been some concerns about recent bladder accidents at night while sleeping.  The report that this is a relatively new symptom.  The patient also reports that he is having more muscle spasms on the left side of his body.  The patient continues to report regular headaches.   Extensive talks with patient, wife and mother suggest that prior to this TBI that the patient was a contrarian in personality style but was not abusive and had good intentions.  With reductions in expressive fluency and left frontal brain injury and his negative feelings following significant loss of function, what is expressed as far as resistance has been exaggerated and amplified.  He has lost his verbal filter and his own frustration is being projected upon others.  The intention is not to harm or negate them but likely more of misplaced underlying negative feelings about self and loss of function.  The patient is showing improvement in these features with regard to his negativity and speech and interaction with others.  He has had extensive work on these issues with the speech pathologist and these efforts appear to be really helpful and beneficial for the patient.  Disposition/Plan:  Will see the patient again as needed  Diagnosis:    Motor and executive functioning deficits        Electronically Signed   _______________________ Arley PhenixJohn Haizley Cannella, Psy.D.

## 2017-08-28 NOTE — Progress Notes (Signed)
Speech Language Pathology Daily Session Note  Patient Details  Name: Jeremy [name] MRN: [id number] Date of Birth: [birth year]  Today's Date: 08/28/2017 SLP Individual Time: 1500-1530 /803-900 SLP Individual Time Calculation (min): 30 min and 57 min  Short Term Goals: Week 3: SLP Short Term Goal 1 (Week 3): Pt will consume trials of regular textures without overt s/s of aspiration and supervision cuesto demonstrate readiness for diet upgrade. SLP Short Term Goal 1 - Progress (Week 3): Met SLP Short Term Goal 2 (Week 3): Given Mod A cues, pt will scan to right of midline to locate objects during semi-complex tasks.  SLP Short Term Goal 3 (Week 3): Pt will complete semi-complex tasks with supervision cues.  SLP Short Term Goal 4 (Week 3): Pt will demonstrate task initiation with Min A cues in 80% of opportunities.  SLP Short Term Goal 5 (Week 3): Pt will demonstrate intellectual awareness by identifying 2 physical and 2 cognitive deficits with Min A cues.  SLP Short Term Goal 6 (Week 3): Pt will demonstrate selective attention in moderately distracting environment for ~ 45 minutes with MIn A cues.   Skilled Therapeutic Interventions:   1# Skilled ST services focused on swallow and cognitive skills. SLP facilitated PO consumption of regular textured foods and thin liquids during breakfast tray, pt required supervision A verbal cues to slow rate with no s/s aspiration. SLP facilitated problem solving, task initiation and selective attention during card game, Speed, pt required Mod A verbal cues fading to extra time and Min A verbal/visual cues for problem solving, task initiation Min A verbal cues and scanning to right Mod A fading to Min A verbal cues. Pt demonstrated overall delayed processing during card game requiring extra time. Pt required supervision A question cues to identify physical deficits and Max a verbal cues for cognitive deficits.Pt was left in room with call bell within reach. Recommend  to continue skilled ST services.   #2  Skilled ST services focused cognitive skills. SLp facilitated basic money management utilizing ALFA, pt required Min A verbal cues for problem solving, task organization and scanning to right. SLP facilitated problem solving, task initiation and selective attention during card game, Speed, pt required Min A verbal/visual cues for problem solving, task initiation supervision verbal cues and scanning to right Min A verbal cues. Pt required supervision A question cues to identify physical deficits and Mod A verbal cues for cognitive deficits.Pt was left in room with call bell within reach. Recommend to continue skilled ST services.      Function:  Eating Eating   Modified Consistency Diet: No Eating Assist Level: Set up assist for;Supervision or verbal cues           Cognition Comprehension Comprehension assist level: Understands complex 90% of the time/cues 10% of the time  Expression   Expression assist level: Expresses complex 90% of the time/cues < 10% of the time  Social Interaction Social Interaction assist level: Interacts appropriately 90% of the time - Needs monitoring or encouragement for participation or interaction.  Problem Solving Problem solving assist level: Solves basic problems with no assist  Memory Memory assist level: Recognizes or recalls 75 - 89% of the time/requires cueing 10 - 24% of the time    Pain    Therapy/Group: Individual Therapy  Locke Barrell  [facility] 08/28/2017, 5:03 PM

## 2017-08-28 NOTE — Progress Notes (Signed)
Occupational Therapy Session Note  Patient Details  Name: Jeremy [name] MRN: [id number] Date of Birth: [birth year]  Today's Date: 08/28/2017 OT Individual Time: 1300-1400 OT Individual Time Calculation (min): 60 min    Short Term Goals: Week 3:  OT Short Term Goal 1 (Week 3): Pt will complete squat pivot transfer R<>L to toilet with min A OT Short Term Goal 2 (Week 3): Pt will maintain static standing balance with L UE support during LB clothing management task (completed total A) in order to reduce caregiver burden OT Short Term Goal 3 (Week 3): Pt will sit EOB to complete dressing task with no more than mod A for dynamic sitting balance  Skilled Therapeutic Interventions/Progress Updates:    Pt seen for OT session focusing on functional transfers and family education. Pt sitting up in w/c upon arrival with mother and wife present, agreeable to tx session.  Pt's wife provided home measurement sheet and pictures of home bathroom layout. Discussed various options for toileting task with emphasis on safety and reducing caregiver burden. Recommendation to place grab bar along wall in bed room for pt to stand at in [facility] and [facility] at while clothing management is completed total A due to pt's poor static/dynamic standing balance. Set-up BSC and w/c in hallway to utilize hall railing and pt able to complete stand/squat pivot transfer w/c <> BSC transferring to L with min A and stood at rail with min A with L UE support in simulation of functional task. Pt's wife and mother in agreement that this is safest and easiest transfer method for home vs. Attempting to get pt to actual toilet. Completed transfer x2 BSC<>w/c with pt's mother assisting on last trial.  Pt then taken to ADL apartment and completed simulated tub/shower transfer utilizing tub bench. Following demonstration and VCs for proper technique, pt completed transfer with overall mod A with min A for squat pivot to bench and  mod A to manage R LE over tub wall and advance hips into shower. He required increased assist for exiting of shower. Discussed various modifications including having pt wear swim trunks into shower or donning pants in shower in order to assist caregiver with transfer out of shower. Plan to complete actual shower task in ADL apartment prior to d/c for more practice. Pt self propelled w/c back towards room at end of session, pt's mother and wife to assist entire way.   Therapy Documentation Precautions:  Precautions Precautions: Fall Precaution Comments: hemicraniectomy placement of bone flap abdominal pocket Required Braces or Orthoses: Other Brace/Splint Other Brace/Splint: Helmet when OOB Restrictions Weight Bearing Restrictions: No Pain:   No/ denies pain  See Function Navigator for Current Functional Status.   Therapy/Group: Individual Therapy  [name] 08/28/2017, 7:11 AM

## 2017-08-29 ENCOUNTER — Inpatient Hospital Stay (HOSPITAL_COMMUNITY): Payer: BLUE CROSS/BLUE SHIELD | Admitting: Occupational Therapy

## 2017-08-29 ENCOUNTER — Inpatient Hospital Stay (HOSPITAL_COMMUNITY): Payer: BLUE CROSS/BLUE SHIELD

## 2017-08-29 ENCOUNTER — Inpatient Hospital Stay (HOSPITAL_COMMUNITY): Payer: BLUE CROSS/BLUE SHIELD | Admitting: Physical Therapy

## 2017-08-29 NOTE — Progress Notes (Signed)
Speech Language Pathology Weekly Progress and Session Note  Patient Details  Name: Jeremy [name] MRN: [id number] Date of Birth: 1976-02-12  Beginning of progress report period: August 22, 2017 End of progress report period: August 29, 2017  Today's Date: 08/29/2017 SLP Individual Time: 1300-1345 / 1000-1030 SLP Individual Time Calculation (min): 45 min and 30 min  Short Term Goals: Week 3: SLP Short Term Goal 1 (Week 3): Pt will consume trials of regular textures without overt s/s of aspiration and supervision cuesto demonstrate readiness for diet upgrade. SLP Short Term Goal 1 - Progress (Week 3): Met SLP Short Term Goal 2 (Week 3): Given Mod A cues, pt will scan to right of midline to locate objects during semi-complex tasks.  SLP Short Term Goal 2 - Progress (Week 3): Met SLP Short Term Goal 3 (Week 3): Pt will complete semi-complex tasks with supervision cues.  SLP Short Term Goal 3 - Progress (Week 3): Not met SLP Short Term Goal 4 (Week 3): Pt will demonstrate task initiation with Min A cues in 80% of opportunities.  SLP Short Term Goal 4 - Progress (Week 3): Met SLP Short Term Goal 5 (Week 3): Pt will demonstrate intellectual awareness by identifying 2 physical and 2 cognitive deficits with Min A cues.  SLP Short Term Goal 5 - Progress (Week 3): Met SLP Short Term Goal 6 (Week 3): Pt will demonstrate selective attention in moderately distracting environment for ~ 45 minutes with MIn A cues.  SLP Short Term Goal 6 - Progress (Week 3): Met    New Short Term Goals: Week 4: SLP Short Term Goal 1 (Week 4): Pt will tolerate regular textures without overt s/s of aspiration at Mod I.  SLP Short Term Goal 2 (Week 4): Given Supervision A cues, pt will scan to right of midline to locate objects during semi-complex tasks.  SLP Short Term Goal 3 (Week 4): Pt will complete semi-complex tasks with supervision cues.  SLP Short Term Goal 4 (Week 4): Pt demonstrate anticipatory awareness and  identify 3 tasks he can participate in at home safely with Supervision A verbal and question cues. SLP Short Term Goal 5 (Week 4): Pt will demonstrate selective attention in moderately distracting environment for ~ 45 minutes with Supervision A verbal and question cues.  SLP Short Term Goal 6 (Week 4): Pt will utilize external memory aids to recall new information at Mod I.   Weekly Progress Updates: Pt demonstrated great progression meeting 5 out 6 goals, demonstrating Min A impairment in scanning to right side during problem solving tasks, completing problem solving tasks, selective attention and intellectual awareness/ emergent awareness.Marland Kitchen SLP added recall and anticipatory awareness goals to address upcoming discharge and focusing skills set on supervision - Mod I level of assistance for cognitive and swallowing skills. Pt would continue to benefit from skilled ST services in order to maximize functional independence and reduce burden of care.    Intensity: Minumum of 1-2 x/day, 30 to 90 minutes Frequency: 3 to 5 out of 7 days Duration/Length of Stay: 2/22 Treatment/Interventions: Cognitive remediation/compensation;Cueing hierarchy;Dysphagia/aspiration precaution training;Functional tasks;Patient/family education;Therapeutic Activities;Speech/Language facilitation;Multimodal communication approach   Daily Session  Skilled Therapeutic Interventions:  1# Skilled ST services focused on cognitive skills. SLP reviewed current progress with pt and mother, pt stated in agreement with progress. SLP facilitated anticipatory awareness skills, pt required Min A verbal/ question cues to list 2 activties he can safely participate in and 2 that are currently unsafe at this time. SLP facilitated semi-complex problem  solving skills utilizing scheduling task, work and class schedules, pt required Mod-Min a verbal cues to scan right and Min A verbal cues for problem solving. Pt was unable to finish task due to  time. Pt was left in room with mother. Recommend to continue skilled ST services.   2# Skilled ST services focused on swallow and cognitive skills. SLP facilitated PO consumption of regular textured foods and thin liquid during lunch, pt required assistance with set up/ opening containers and Supervision A question cues to slow rate. Pt often reponded with opposite answer, SLP educated pt on rules in therapy and it is important to given your best answer for each presented question in order to better direct therapy and understanding current ability SLP facilitated semi-complex problem solving continuing scheduling task, work and class scheudle, pt required Min A verbal and question cues to correct problem solving error and Mod A verbal/visual cues to scan to right. SLP facilitated anticipatory awareness skills providing safety scenarios,pt required Min A verbal cues to eliminate behaviors of opposite responses. Pt was left in room with family members. Recommend to continue skilled ST services.    Function:   Eating Eating   Modified Consistency Diet: No Eating Assist Level: Set up assist for;Supervision or verbal cues   Eating Set Up Assist For: Opening containers       Cognition Comprehension Comprehension assist level: Understands complex 90% of the time/cues 10% of the time  Expression   Expression assist level: Expresses complex 90% of the time/cues < 10% of the time  Social Interaction Social Interaction assist level: Interacts appropriately 90% of the time - Needs monitoring or encouragement for participation or interaction.  Problem Solving Problem solving assist level: Solves complex 90% of the time/cues < 10% of the time;Solves basic problems with no assist  Memory Memory assist level: Recognizes or recalls 75 - 89% of the time/requires cueing 10 - 24% of the time   General    Pain Pain Assessment Pain Assessment: No/denies pain  Therapy/Group: Individual Therapy  Eliyana Pagliaro   [facility] 08/29/2017, 3:01 PM

## 2017-08-29 NOTE — Patient Care Conference (Signed)
Inpatient RehabilitationTeam Conference and Plan of Care Update Date: 08/29/2017   Time: 10:30 AM    Patient Name: Jeremy [name]      Medical Record Number: [id number]  Date of Birth: [birth year] Sex: Male         Room/Bed: 4W12C/4W12C-01 Payor Info: Payor: [place] / Plan: BCBS OTHER / Product Type: *No Product type* /    Admitting Diagnosis: Stroke CVA  Admit Date/Time:  08/07/2017  8:36 PM Admission Comments: No comment available   Primary Diagnosis:  <principal problem not specified> Principal Problem: <principal problem not specified>  Patient Active Problem List   Diagnosis Date Noted  . Acute blood loss anemia   . Vascular headache   . Spastic hemiplegia of right dominant side as late effect of cerebral infarction ([place])   . Benign essential HTN   . Frontal lobe and executive function deficit following cerebral infarction   . Adjustment disorder with mixed anxiety and depressed mood   . Left middle cerebral artery stroke ([place]) 08/07/2017  . Aphasia due to acute stroke ([place]) 08/07/2017  . B12 deficiency   . Acute respiratory failure ([place])   . Central line infiltration ([place])   . Endotracheal tube present   . Fever   . Leukocytosis   . S/P craniotomy 07/30/2017  . Cerebral edema ([facility]) 07/28/2017  . Hyperlipidemia 07/28/2017  . Stroke (cerebrum) ([place]) 07/27/2017    Expected Discharge Date: Expected Discharge Date: 09/07/17  Team Members Present: Physician leading conference: Dr. Alysia Penna Social Worker Present: Ovidio Kin, LCSW Nurse Present: Brita Romp, RN PT Present: Phylliss Bob, PTA;Barrie Folk, PT OT Present: Napoleon Form, OT SLP Present: Charolett Bumpers, SLP PPS Coordinator present : Daiva Nakayama, RN, CRRN     Current Status/Progress Goal Weekly Team Focus  Medical   Severe right hemiparesis and right hemisensory deficits, improved intake  Full effort in therapy  Continuing medication trial for attention and alertness as well as  adjustment   Bowel/Bladder   Incontinence of bladder at night  Regain continence  Assist with toileting as needed.   Swallow/Nutrition/ Hydration   Regular and thin, supervision  Supervision with least restrictive diet - goal met       ADL's   Min-mod A functional transfers depending on set-up, min A static standing balance with UE support, total A toileting; mod A UB/LB dressing  min-mod A overall  Functional transfers, functional standing balance, family ed, d/c planning, neuro re-ed   Mobility   minA bed mobility with use of features, minA squat pivot to L, modA to R, modA sit to stand, maxA gait with HW, supervision w/c mobility   min assist trasnfers, supervision assist WC mobility, mod assist ambulation with LRAD   functional transfers, standing balance, w/c mobility, family edu   Communication   mod I  Mod I - goal met      Safety/Cognition/ Behavioral Observations  Min A -supervision A semi-complex problem solving, scan right, recall,   Min A to supervision - upgraded from basic to semi-complex problem solving and from sustained to selective attention  problem solving, selective attentiion, antitcipatory awareness and education    Pain   denies pain  pain < 2  assess qshift and prn   Skin   no new skin issues  maintain skin integrity  assess qshift and prn      *See Care Plan and progress notes for long and short-term goals.     Barriers to Discharge  Current Status/Progress Possible  Resolutions Date Resolved   Physician    Medical stability;Inaccessible home environment  Increasing tone in the upper limb greater than lower limb  Slow progress  Family training      Nursing                  PT                    OT                  SLP                SW                Discharge Planning/Teaching Needs:  Mom concerned about the amount of care pt is and wonders if can extend him due to making good progress in therapies. Wife here but unable to assist due to  pregnancy      Team Discussion:  Pt continues to make progress toward his goals of min-mod level of assist. Mom is beginning family training due to wife unable due to pregnancy. MD checking UA due to incontinence at night which is new. Could also be spasicity in bladder. Regular diet thin diet now. E-stim yesterday with OT did well with.  Neuro-psych seeing for coping  Revisions to Treatment Plan:  DC 2/22    Continued Need for Acute Rehabilitation Level of Care: The patient requires daily medical management by a physician with specialized training in physical medicine and rehabilitation for the following conditions: Daily direction of a multidisciplinary physical rehabilitation program to ensure safe treatment while eliciting the highest outcome that is of practical value to the patient.: Yes Daily medical management of patient stability for increased activity during participation in an intensive rehabilitation regime.: Yes Daily analysis of laboratory values and/or radiology reports with any subsequent need for medication adjustment of medical intervention for : Neurological problems;Mood/behavior problems  Jeremy [name], Gardiner Rhyme 08/29/2017, 12:45 PM

## 2017-08-29 NOTE — Progress Notes (Signed)
Social Work Patient ID: [name], male   DOB: May 21, 1976, 42 y.o.   MRN: [id number]  Met with pt, Mom and wife to discuss team conference progression toward his goals of min-mod level and discharge extended to 2/22/ for training and continued progress. All very pleased and feel this is what he needs, pt agrees also. Checking UA regarding incontinence at night which is new. MD checking UA or feels it is due to spasms at night. Will continue to work on discharge needs and equipment, will begin with home health therapies then transition to OP when easier transfer into the car. Continue to work on discharge needs.

## 2017-08-29 NOTE — Plan of Care (Signed)
  Not Progressing RH BLADDER ELIMINATION RH STG MANAGE BLADDER WITH ASSISTANCE Description STG Manage Bladder With mod Assistance  08/29/2017 1454 - Not Progressing by Fabian SharpWhisner, Capricia Serda, RN Note Pt having incontinent episodes overnight requiring max assistance. UA & CS ordered. Possibly r/t increased spasms at night. Pt encouraged to empty bladder just prior to bedtime and to monitor fluid intake just prior to bedtime.

## 2017-08-29 NOTE — Progress Notes (Signed)
Subjective/Complaints:  No new problems overnite per pt.  Denies RUE pain, some RIght sided abd pain noted Per SLP pt tends to answer in the negative ROS: Limited due to cognitive/behavioral    Objective: Vital Signs: Blood pressure 133/88, pulse 99, temperature 99 F (37.2 C), temperature source Oral, resp. rate 16, weight 85 kg (187 lb 6.3 oz), SpO2 94 %. No results found. Results for orders placed or performed during the hospital encounter of 08/07/17 (from the past 72 hour(s))  Basic metabolic panel     Status: Abnormal   Collection Time: 08/27/17  5:22 AM  Result Value Ref Range   Sodium 139 135 - 145 mmol/L   Potassium 3.6 3.5 - 5.1 mmol/L   Chloride 102 101 - 111 mmol/L   CO2 25 22 - 32 mmol/L   Glucose, Bld 113 (H) 65 - 99 mg/dL   BUN 12 6 - 20 mg/dL   Creatinine, Ser 1.01 0.61 - 1.24 mg/dL   Calcium 8.8 (L) 8.9 - 10.3 mg/dL   GFR calc non Af Amer >60 >60 mL/min   GFR calc Af Amer >60 >60 mL/min    Comment: (NOTE) The eGFR has been calculated using the CKD EPI equation. This calculation has not been validated in all clinical situations. eGFR's persistently <60 mL/min signify possible Chronic Kidney Disease.    Anion gap 12 5 - 15    Comment: Performed at [facility] [address], [place], [place] [address]  CBC with Differential/Platelet     Status: Abnormal   Collection Time: 08/27/17  5:22 AM  Result Value Ref Range   WBC 10.0 4.0 - 10.5 K/uL   RBC 4.39 4.22 - 5.81 MIL/uL   Hemoglobin 12.6 (L) 13.0 - 17.0 g/dL   HCT 39.4 39.0 - 52.0 %   MCV 89.7 78.0 - 100.0 fL   MCH 28.7 26.0 - 34.0 pg   MCHC 32.0 30.0 - 36.0 g/dL   RDW 13.3 11.5 - 15.5 %   Platelets 214 150 - 400 K/uL   Neutrophils Relative % 71 %   Neutro Abs 7.1 1.7 - 7.7 K/uL   Lymphocytes Relative 21 %   Lymphs Abs 2.1 0.7 - 4.0 K/uL   Monocytes Relative 6 %   Monocytes Absolute 0.6 0.1 - 1.0 K/uL   Eosinophils Relative 2 %   Eosinophils Absolute 0.2 0.0 - 0.7 K/uL   Basophils Relative  0 %   Basophils Absolute 0.0 0.0 - 0.1 K/uL    Comment: Performed at [facility] [address], [place], [place] [address]     HEENT: scalp incision CDI, mild ptosis left eye Cardio: RRR without murmur. No JVD  Resp: CTA Bilaterally without wheezes or rales. Normal effort  GI: BS positive and abd pocket for bone flap incision CDI, no abd denterness Musculoskeletal:  No Edema. No tenderness Skin:   Wound C/D/I and abd and scalp Neuro: Alert/Oriented,  Motor: Right upper extremity/right lower extremity: 0/5 proximal to distal (stable) Sensation absent to light touch right upper extremity/right lower extremity Tone:  Increased R pec, wrist flexors  clonus R ankle  Gen NAD. Vital signs reviewed.  Assessment/Plan: 1. Functional deficits secondary to Left MCA infarct which require 3+ hours per day of interdisciplinary therapy in a comprehensive inpatient rehab setting. Physiatrist is providing close team supervision and 24 hour management of active medical problems listed below. Physiatrist and rehab team continue to assess barriers to discharge/monitor patient progress toward functional and medical  goals. FIM: Function - Bathing Position: Shower Body parts bathed by patient: Right arm, Left arm, Chest, Abdomen, Front perineal area, Buttocks, Right upper leg, Left upper leg, Left lower leg Body parts bathed by helper: Right lower leg, Back Assist Level: Touching or steadying assistance(Pt > 75%)  Function- Upper Body Dressing/Undressing What is the patient wearing?: Pull over shirt/dress Pull over shirt/dress - Perfomed by patient: Thread/unthread left sleeve, Put head through opening, Pull shirt over trunk, Thread/unthread right sleeve Pull over shirt/dress - Perfomed by helper: Thread/unthread right sleeve Assist Level: Touching or steadying assistance(Pt > 75%) Function - Lower Body Dressing/Undressing What is the patient wearing?: Underwear, Pants, Socks Position:  Wheelchair/chair at sink Underwear - Performed by patient: Thread/unthread left underwear leg Underwear - Performed by helper: Pull underwear up/down, Thread/unthread right underwear leg(pt able to thread RLE if held in seated figure 4) Pants- Performed by patient: Thread/unthread left pants leg Pants- Performed by helper: Pull pants up/down, Thread/unthread right pants leg(pt able to thread RLE if held in seated figure ) Non-skid slipper socks- Performed by patient: Don/doff left sock Non-skid slipper socks- Performed by helper: Don/doff right sock Socks - Performed by patient: Don/doff right sock, Don/doff left sock Socks - Performed by helper: Don/doff right sock Shoes - Performed by patient: Don/doff right shoe, Don/doff left shoe Shoes - Performed by helper: Fasten right, Fasten left Assist for footwear: Partial/moderate assist Assist for lower body dressing: Touching or steadying assistance (Pt > 75%)  Function - Toileting Toileting activity did not occur: No continent bowel/bladder event Toileting steps completed by helper: Performs perineal hygiene, Adjust clothing after toileting, Adjust clothing prior to toileting Toileting Assistive Devices: Grab bar or rail Assist level: Two helpers  Function - Air cabin crew transfer activity did not occur: Safety/medical concerns Toilet transfer assistive device: Radio broadcast assistant lift: Stedy Assist level to toilet: Touching or steadying assistance (Pt > 75%) Assist level from toilet: Touching or steadying assistance (Pt > 75%) Assist level to bedside commode (at bedside): Touching or steadying assistance (Pt > 75%) Assist level from bedside commode (at bedside): Touching or steadying assistance (Pt > 75%)  Function - Chair/bed transfer Chair/bed transfer method: Stand pivot Chair/bed transfer assist level: Moderate assist (Pt 50 - 74%/lift or lower) Chair/bed transfer assistive device: Armrests, Orthosis Mechanical lift:  Stedy Chair/bed transfer details: Manual facilitation for placement, Manual facilitation for weight bearing, Verbal cues for technique, Verbal cues for sequencing, Manual facilitation for weight shifting, Verbal cues for safe use of DME/AE  Function - Locomotion: Wheelchair Will patient use wheelchair at discharge?: Yes Type: Manual Max wheelchair distance: 120' Assist Level: Supervision or verbal cues Assist Level: Supervision or verbal cues Assist Level: Supervision or verbal cues Turns around,maneuvers to table,bed, and toilet,negotiates 3% grade,maneuvers on rugs and over doorsills: No Function - Locomotion: Ambulation Ambulation activity did not occur: Safety/medical concerns Assistive device: Walker-hemi Max distance: 79f  Assist level: Maximal assist (Pt 25 - 49%) Walk 10 feet activity did not occur: Safety/medical concerns Assist level: Maximal assist (Pt 25 - 49%) Walk 50 feet with 2 turns activity did not occur: Safety/medical concerns Assist level: 2 helpers Walk 10 feet on uneven surfaces activity did not occur: Safety/medical concerns  Function - Comprehension Comprehension: Auditory Comprehension assist level: Understands complex 90% of the time/cues 10% of the time  Function - Expression Expression: Verbal Expression assist level: Expresses complex 90% of the time/cues < 10% of the time  Function - Social Interaction Social Interaction assist level: Interacts appropriately  90% of the time - Needs monitoring or encouragement for participation or interaction.  Function - Problem Solving Problem solving assist level: Solves basic problems with no assist  Function - Memory Memory assist level: Recognizes or recalls 75 - 89% of the time/requires cueing 10 - 24% of the time Patient normally able to recall (first 3 days only): Current season, Staff names and faces, That he or she is in a hospital, Location of own room  Medical Problem List and Plan: 1.Dense right  hemiparesis and aphasia/dysphagiasecondary to left large MCA infarction/left M1 occlusion/ACA infarction status post TPA with unsuccessful attempt at mechanical thrombectomy. Status post decompressive hemicraniectomy placement of bone flap abdominal pocket 07/29/2017. Will order protective helmet Continue CIR    Team conference today please see physician documentation under team conference tab, met with team face-to-face to discuss problems,progress, and goals. Formulized individual treatment plan based on medical history, underlying problem and comorbidities.  2. DVT Prophylaxis/Anticoagulation: Subcutaneous Lovenox initiated 07/31/2017. Venous Doppler studies negative 3. Pain Management:Tylenol as needed, pain behind left eye discussed with optho, not likely due to pressure on optic nerve as that will cause loss of vision but not pain.  Treating HA with topiramate, 38m increase to BID  Likely related to his cerebral edema.        Repeat CT showed improvement 4. Mood:Provide emotional support.Patient with coping issues due to his diagnosis and physical losses. Social worker to screen. Neuropsychology to be involved as well. Now on Ritalin and Lexapro 5. Neuropsych: This patient isnot completelycapable of making decisions on hisown behalf. 6. Skin/Wound Care:Routine skin checks, skin healing well scalp and abd, s/p staple removal 7. Fluids/Electrolytes/Nutrition:Routine I&O's- Eats 100% meals    8.Dysphagia. Advanced to regular thin diet on 2/7  9.Hypertension.Monitor with increased mobility Vitals:   08/28/17 1442 08/29/17 0328  BP: 127/89 133/88  Pulse: 83 99  Resp: 18 16  Temp: 98.7 F (37.1 C) 99 F (37.2 C)  SpO2: 93% 94%    Control is overall in good range 10.Hyperlipidemia. Zetia/Lipitor 11. Elevated transanimase likely lipitor but < 3x ULN, monitor 12.  Bladder incontinence resolved 13.  Bowel incontinence resolved.  14. Leukocytosis   WBCs 12.3 on  1/23   Afebrile   White blood cells down to 10.0 on 08/27/2017 15. Acute blood loss anemia   Hemoglobin 11.7 on 1/23   Hemoglobin 12.6 on 08/27/2017  LOS (Days) 22 A FACE TO FACE EVALUATION WAS PERFORMED  ACharlett Blake2/13/2019, 6:52 AM

## 2017-08-29 NOTE — Progress Notes (Signed)
Physical Therapy Session Note  Patient Details  Name: Jeremy [name] MRN: [id number] Date of Birth: 1976/04/14  Today's Date: 08/29/2017 PT Individual Time: 1115-1210 PT Individual Time Calculation (min): 55 min   Short Term Goals: Week 3:  PT Short Term Goal 1 (Week 3): Pt will transfer to [facility] with min assist  PT Short Term Goal 2 (Week 3): Pt will propell WC >163f with supervisoin assist consistently  PT Short Term Goal 3 (Week 3): Pt will ambulate 369fwith max assist of 1  PT Short Term Goal 4 (Week 3): Pt will maintain sitting balance with supervision assist up to 10 minutes  PT Short Term Goal 5 (Week 3): Pt will maintain standing balance with min assist and 1 UE support for up to 2 min.   Skilled Therapeutic Interventions/Progress Updates: Pt presented in w/c with family present. Session focused on standing balance with use of RW. Pt transported to rehab gym for time management. Performed squat pivot to L min A to mat. Pt sat unsupported at mat approx 30 min during session with min guard. Performed sit to/from stand with RW with intermittent cues for hand placement and minA. Static standing balance initially with visual feedabck for wt shifting and neutral alignment. PTA provided tactile cues for increasing wt shift to R with pt able to maintain neutral for short bouts 3-5 seconds. Participated in standing reaching with LUE with emphasis on reaching to R to reinforce R wt shifting. Pt able to perform x 2 bouts of reaching for cards with overall minA. Pt returned to w/c squat pivot ro R with minA and improved technique from previous session. Pt transported to day room and trialed Cybex Kinetron at 40cm/sec for reciprocal activity. Pt initially requiring assist for initiation however was able to complete approx 10 cycles without assist. Pt returned to room at end of session with mother present and current needs met.      Therapy Documentation Precautions:  Precautions Precautions:  Fall Precaution Comments: hemicraniectomy placement of bone flap abdominal pocket Required Braces or Orthoses: Other Brace/Splint Other Brace/Splint: Helmet when OOB Restrictions Weight Bearing Restrictions: No   See Function Navigator for Current Functional Status.   Therapy/Group: Individual Therapy  Jeremy [name]  Kaitlyne Friedhoff, PTA  08/29/2017, 12:19 PM

## 2017-08-29 NOTE — Progress Notes (Signed)
Occupational Therapy Weekly Progress Note  Patient Details  Name: Jeremy [name] MRN: [id number] Date of Birth: 1975/09/21  Beginning of progress report period: August 22, 2017 End of progress report period: August 29, 2017  Today's Date: 08/29/2017 OT Individual Time: 4742-5956 OT Individual Time Calculation (min): 60 min    Patient has met 2 of 3 short term goals. Pt cont to make steady progress towards OT goals. He cont to be most limited by dense R hemiplegia. He is completing tasks at overall mod A level, requiring total A +2  for toileting tasks  Due to poor static standing balance despite UE support.  Pt's mother has been present for all tx sessions and has begun performing hands on training. Tx sessions focusing on ADL re-training with emphasis on reducing caregiver burden and decreasing fall/ safety risks.   Patient continues to demonstrate the following deficits:abnormal posture, acute pain, apraxia, ataxia, cognitive deficits, disturbance of vision, flaccid hemiplegia and hemiparesis, hemiplegia affecting non-dominant side and muscle weakness (generalized) and therefore will continue to benefit from skilled OT intervention to enhance overall performance with BADL and Reduce care partner burden.  Patient progressing toward long term goals..  Plan of care revisions: Goals downgraded to mod A overall. Total A toileting while pt maintains standing balance in order to reduce fall risk. See POC for goal details. .  OT Short Term Goals Week 3:  OT Short Term Goal 1 (Week 3): Pt will complete squat pivot transfer R<>L to toilet with min A OT Short Term Goal 1 - Progress (Week 3): Met OT Short Term Goal 2 (Week 3): Pt will maintain static standing balance with L UE support during LB clothing management task (completed total A) in order to reduce caregiver burden OT Short Term Goal 2 - Progress (Week 3): Progressing toward goal OT Short Term Goal 3 (Week 3): Pt will sit EOB to complete  dressing task with no more than mod A for dynamic sitting balance OT Short Term Goal 3 - Progress (Week 3): Met Week 4:  OT Short Term Goal 1 (Week 4): STG=LTG due to LOS  Skilled Therapeutic Interventions/Progress Updates:    Pt seen for OT session focusing on ADL re-training, and  functional sitting/standing balance. Pt in supine upon arrival, agreeable to tx session. With increased time for processing of directions and mod-max A, pt transferred to sitting EOB from flat bed in simulation of home environment.  He dressed seated EOB, overall min A, however, mod-max A when pt leaning forward to thread LEs into pants. HE required VCs for sequencing and problem solving of LB dressing task, unsure if this was behavior vs. True sequencing difficulty. He required total A to obtain and maintain figure four positioning in order to don R sock. He stood at [employer] with R hand splint with min A and with mod steadying assist and leaning against EOB for support, pt able to pull pants up.  He completed mod A squat pivot transfer to w/c. Following set-up, pt stood at sink with R UE placed in weightbearing position on sink ledge while he completed oral care, min A static standing balance progressing to mod A with VCs to return to midline due to R lean.  Kinesiotape reapplied to R shoulder to address shoulder subluxation and increase stimuli to R UE.  Pt left seated in w/c at end of session with QRB donned, set-up with breakfast tray and pt's mom entering room.     Therapy Documentation Precautions:  Precautions Precautions:  Fall Precaution Comments: hemicraniectomy placement of bone flap abdominal pocket Required Braces or Orthoses: Other Brace/Splint Other Brace/Splint: Helmet when OOB Restrictions Weight Bearing Restrictions: No Pain: Pain Assessment Pain Assessment: No/denies pain  See Function Navigator for Current Functional Status.   Therapy/Group: Individual Therapy  Elara Cocke L 08/29/2017, 7:08  AM

## 2017-08-30 ENCOUNTER — Inpatient Hospital Stay (HOSPITAL_COMMUNITY): Payer: BLUE CROSS/BLUE SHIELD | Admitting: Speech Pathology

## 2017-08-30 ENCOUNTER — Inpatient Hospital Stay (HOSPITAL_COMMUNITY): Payer: BLUE CROSS/BLUE SHIELD | Admitting: Occupational Therapy

## 2017-08-30 ENCOUNTER — Inpatient Hospital Stay (HOSPITAL_COMMUNITY): Payer: BLUE CROSS/BLUE SHIELD | Admitting: Physical Therapy

## 2017-08-30 LAB — URINALYSIS, ROUTINE W REFLEX MICROSCOPIC
Bilirubin Urine: NEGATIVE
GLUCOSE, UA: NEGATIVE mg/dL
Ketones, ur: NEGATIVE mg/dL
Nitrite: NEGATIVE
PROTEIN: NEGATIVE mg/dL
SQUAMOUS EPITHELIAL / LPF: NONE SEEN
Specific Gravity, Urine: 1.019 (ref 1.005–1.030)
pH: 6 (ref 5.0–8.0)

## 2017-08-30 NOTE — Plan of Care (Signed)
  Progressing RH BOWEL ELIMINATION RH STG MANAGE BOWEL WITH ASSISTANCE Description STG Manage Bowel with mod Assistance.  08/30/2017 0341 - Progressing by Evelena Asa, RN RH STG MANAGE BOWEL W/MEDICATION W/ASSISTANCE Description STG Manage Bowel with Medication and min Assistance.  08/30/2017 0341 - Progressing by Evelena Asa, RN RH BLADDER ELIMINATION RH STG MANAGE BLADDER WITH ASSISTANCE Description STG Manage Bladder With mod Assistance  08/30/2017 0341 - Progressing by Evelena Asa, RN RH SKIN INTEGRITY RH STG SKIN FREE OF INFECTION/BREAKDOWN Description Skin to remain free from infection and breakdown while on rehab with max assist.  08/30/2017 0341 - Progressing by Evelena Asa, RN RH SAFETY RH STG ADHERE TO SAFETY PRECAUTIONS W/ASSISTANCE/DEVICE Description STG Adhere to Safety Precautions With max Assistance and appropriate assistive Device.  08/30/2017 0341 - Progressing by Evelena Asa, RN   Completed/Met RH PAIN MANAGEMENT RH STG PAIN MANAGED AT OR BELOW PT'S PAIN GOAL Description <3 on a 0-10 pain scale  08/30/2017 0341 - Completed/Met by Evelena Asa, RN

## 2017-08-30 NOTE — Progress Notes (Signed)
Physical Therapy Session Note  Patient Details  Name: Jeremy [name] MRN: [id number] Date of Birth: 1975-11-01  Today's Date: 08/30/2017 PT Individual Time: 1345-1445 PT Individual Time Calculation (min): 60 min   Short Term Goals: Week 3:  PT Short Term Goal 1 (Week 3): Pt will transfer to [facility] with min assist  PT Short Term Goal 2 (Week 3): Pt will propell WC >112f with supervisoin assist consistently  PT Short Term Goal 3 (Week 3): Pt will ambulate 372fwith max assist of 1  PT Short Term Goal 4 (Week 3): Pt will maintain sitting balance with supervision assist up to 10 minutes  PT Short Term Goal 5 (Week 3): Pt will maintain standing balance with min assist and 1 UE support for up to 2 min.   Skilled Therapeutic Interventions/Progress Updates:   Pt received sitting on toilet and agreeable to PT.   Sit<>stand to manage pants with heavy use of rail. [facility] and mod assist due to R knee instability.   Pt transported to rehab gym. Orthotist present for consult. Gait training with RW and Blue rocker AFO to stabilize the R knee and ankle. Pt continues to have no active hip flexion and mild pushers syndrome with fatigue. Able to extend R LE 75% of the time with cues for midline orientation.   Sit<>stand from mat table with min assist and RW support once in standing x 15 throughout treatment.   Standing tolerance with UE support on RW with R hand splint min-supervision assist with forward and cross body reaches 2 x 10.   Forward stepping to target with the RLE. Max assist from PT to advance the RLE and improve weight shift to the L. Pt noted to have pushing tendencies when asked to weight shift to the L.   Stand pivot transfers with RW to WC and max assist with max cues for gait pattern and posture.   [facility] returned to [facility] and [facility] and all needs met.            Therapy Documentation Precautions:   Precautions Precautions: Fall Precaution Comments: hemicraniectomy placement of bone flap abdominal pocket Required Braces or Orthoses: Other Brace/Splint Other Brace/Splint: Helmet when OOB Restrictions Weight Bearing Restrictions: No   Pain: denies    See Function Navigator for Current Functional Status.   Therapy/Group: Individual Therapy  AuLorie Phenix/14/2019, 3:39 PM
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
## 2017-08-30 NOTE — Progress Notes (Signed)
Occupational Therapy Session Note  Patient Details  Name: Jeremy [name] MRN: [id number] Date of Birth: Mar 17, 1976  Today's Date: 08/30/2017 OT Individual Time: 1030-1200 OT Individual Time Calculation (min): 90 min    Short Term Goals: Week 4:  OT Short Term Goal 1 (Week 4): STG=LTG due to LOS  Skilled Therapeutic Interventions/Progress Updates:    Pt seen for OT ADL bathing/dressing session. Pt sitting up in w/c upon arrival with mother present. Planned session for showering in ADL apartment utilizing tub/shower combination in simulation of home environment. Pt wheeled w/c in room using hemi technique to gather clothing items requiring VCs for awareness of environmental obstacles on R.  In ADL apartment, pt's mother assisted with all transfers. She was able to recall technique taught in previous session for squat pivot to tub bench and managing pt's R LE over tub wall. Pt stood in shower with use of grab bar ffor clothing to be doffed. Pt with episode of LOB in standing to R without awarness, requiring total A to regain balance. Pt bathed seated on tub bench with steadying assist for safety while pt bathed. LB dressing completed total A for safety while seated on tub bench. He was able to don shirt with VCs for problem solving techniques and min A for clothing management. Pt's mother provided min-mod A for transfer from tub bench to w/c.  Reapplied kinesiotape re-applied to shoulder for subluxation. In therapy gym, attempted to have pt sit at high/low table and utilize arm skate for elbow flexion/extension in gravity eliminated position. Pt perseverating on dry skin on [name] hand, and unable to focus on task. Therefore transitioned to completing standing towel pushes with min progressing to mod A for standing balance with blocking of R knee. Completed x2 trials in all planes with seated rest breaks provided btwn trials.  Pt returned to room at end of session, left seated in w/c with mother present and  chaplain present.   Therapy Documentation Precautions:  Precautions Precautions: Fall Precaution Comments: hemicraniectomy placement of bone flap abdominal pocket Required Braces or Orthoses: Other Brace/Splint Other Brace/Splint: Helmet when OOB Restrictions Weight Bearing Restrictions: No Pain:   No/ denies pain  See Function Navigator for Current Functional Status.   Therapy/Group: Individual Therapy  Jeremy [name] 08/30/2017, 7:20 AM

## 2017-08-30 NOTE — Progress Notes (Signed)
Speech Language Pathology Daily Session Note  Patient Details  Name: Jeremy [name] MRN: [id number] Date of Birth: 03-22-1976  Today's Date: 08/30/2017 SLP Individual Time: 0800-0900 SLP Individual Time Calculation (min): 60 min  Short Term Goals: Week 4: SLP Short Term Goal 1 (Week 4): Pt will tolerate regular textures without overt s/s of aspiration at Mod I.  SLP Short Term Goal 2 (Week 4): Given Supervision A cues, pt will scan to right of midline to locate objects during semi-complex tasks.  SLP Short Term Goal 3 (Week 4): Pt will complete semi-complex tasks with supervision cues.  SLP Short Term Goal 4 (Week 4): Pt demonstrate anticipatory awareness and identify 3 tasks he can participate in at home safely with Supervision A verbal and question cues. SLP Short Term Goal 5 (Week 4): Pt will demonstrate selective attention in moderately distracting environment for ~ 45 minutes with Supervision A verbal and question cues.  SLP Short Term Goal 6 (Week 4): Pt will utilize external memory aids to recall new information at Mod I.   Skilled Therapeutic Interventions: Skilled treatment session focused on dysphagia and cognition goals. SLP facilitated session by providing supervision of pt consuming regular breakfast tray with thin liquids. Pt consumed without any cues or interventions. Pt is appropriate for intermittent nursing supervision. Pt able to demonstrate selective attention in moderately distracting environment for ~ 60 minutes without cues. Pt requires Mod A cues to complete anticipatory awareness d/t behavioral answer "I don't know." Will target more hypothetical situations in next session. Pt returned to room, left upright in wheelchair with all needs within reach. Continue per current plan of care.      Function:  Eating Eating   Modified Consistency Diet: No Eating Assist Level: Set up assist for;Supervision or verbal cues   Eating Set Up Assist For: Opening containers        Cognition Comprehension Comprehension assist level: Follows complex conversation/direction with extra time/assistive device;Understands complex 90% of the time/cues 10% of the time  Expression   Expression assist level: Expresses complex 90% of the time/cues < 10% of the time;Expresses complex ideas: With extra time/assistive device  Social Interaction Social Interaction assist level: Interacts appropriately 90% of the time - Needs monitoring or encouragement for participation or interaction.  Problem Solving Problem solving assist level: Solves complex 90% of the time/cues < 10% of the time;Solves complex problems: With extra time  Memory Memory assist level: Recognizes or recalls 90% of the time/requires cueing < 10% of the time    Pain    Therapy/Group: Individual Therapy  Soliana Kitko 08/30/2017, 10:43 AM

## 2017-08-31 ENCOUNTER — Inpatient Hospital Stay (HOSPITAL_COMMUNITY): Payer: BLUE CROSS/BLUE SHIELD | Admitting: Physical Therapy

## 2017-08-31 ENCOUNTER — Inpatient Hospital Stay (HOSPITAL_COMMUNITY): Payer: BLUE CROSS/BLUE SHIELD | Admitting: Occupational Therapy

## 2017-08-31 MED ORDER — CEPHALEXIN 250 MG PO CAPS
250.0000 mg | ORAL_CAPSULE | Freq: Three times a day (TID) | ORAL | Status: DC
  Administered 2017-08-31 – 2017-09-03 (×9): 250 mg via ORAL
  Filled 2017-08-31 (×9): qty 1

## 2017-08-31 NOTE — Progress Notes (Signed)
Physical Therapy Weekly Progress Note  Patient Details  Name: Jeremy [name] MRN: [id number] Date of Birth: 1975/09/10  Beginning of progress report period: August 23, 2017 End of progress report period: August 31, 2017  Today's Date: 08/31/2017 PT Individual Time:810-905   29mn  Patient has met 4 of 4 short term goals.  Pt continues to make steady progress towards long term goals, significant improvements in postural control, transfers, and WC mobility over the past week. Pt able to maintain sitting balance with supervision assist from PT, transfer to R and L with min assist, and propel WC with supervision assist throughout rehab unit. Pushers syndrome in standing, poor proprioception, and internal lack of motivation are this pt's most limiting factors.   Patient continues to demonstrate the following deficits muscle weakness and muscle paralysis, impaired timing and sequencing, abnormal tone, unbalanced muscle activation, motor apraxia, decreased coordination and decreased motor planning, decreased visual acuity, decreased visual perceptual skills and field cut, decreased midline orientation, decreased attention to right, right side neglect and ideational apraxia, decreased initiation, decreased problem solving, decreased safety awareness and delayed processing and decreased sitting balance, decreased standing balance, decreased postural control, hemiplegia and decreased balance strategies and therefore will continue to benefit from skilled PT intervention to increase functional independence with mobility.  Patient progressing toward long term goals..  Continue plan of care.  PT Short Term Goals Week 3:  PT Short Term Goal 1 (Week 3): [facility]  [facility] Short Term Goal 1 - Progress (Week 3): Met PT Short Term Goal 2 (Week 3): Pt will propell WC >155fwith supervisoin assist consistently  PT Short Term Goal 2 - Progress (Week 3): Met PT Short Term Goal 3 (Week 3): Pt  will ambulate 3062fith max assist of 1  PT Short Term Goal 3 - Progress (Week 3): Met PT Short Term Goal 4 (Week 3): Pt will maintain sitting balance with supervision assist up to 10 minutes  PT Short Term Goal 4 - Progress (Week 3): Met PT Short Term Goal 5 (Week 3): Pt will maintain standing balance with min assist and 1 UE support for up to 2 min.  Week 4:  PT Short Term Goal 1 (Week 4): STG=LTG due to ELOS  Skilled Therapeutic Interventions/Progress Updates:   Pt received sitting on toilet and agreeable to PT. Sit<>stand to perineal care and clothing management with min assist.   Pt instructed pt in gait training with RW [id number] max assist. Pt able to initiate hip flexion on this day with mod assist fading to max assist for weight shift to the L. Pt noted to have increasing pushing tendencies to the R when fatigued.   Sit<>stand with min assist from PT x 5 throughout treatment.   WC mobility training x 150ft 52f supervision assist from PT.  Cues for improved attention to the R.   Patient returned to room and [facility] reach and all needs met.         Therapy Documentation Precautions:  Precautions Precautions: Fall Precaution Comments: hemicraniectomy placement of bone flap abdominal pocket Required Braces or Orthoses: Other Brace/Splint Other Brace/Splint: Helmet when OOB Restrictions Weight Bearing Restrictions: No Pain: Pain Assessment Pain Assessment: No/denies pain   See Function Navigator for Current Functional Status.  Therapy/Group: Individual Therapy  AustinLorie Phenix2019, 7:59 AM

## 2017-08-31 NOTE — Progress Notes (Signed)
Subjective/Complaints: Seen in gym, physical therapy achieved right hip flexion without gravity today.  We discussed neuromuscular electrical stimulation.  This has been used in the upper extremity but not in the lower extremity due to limitation in current equipment available  ROS: Limited due to cognitive/behavioral    Objective: Vital Signs: Blood pressure 129/89, pulse 80, temperature 98 F (36.7 C), temperature source Oral, resp. rate 16, weight 85 kg (187 lb 6.3 oz), SpO2 100 %. No results found. Results for orders placed or performed during the hospital encounter of 08/07/17 (from the past 72 hour(s))  Urinalysis, Routine w reflex microscopic     Status: Abnormal   Collection Time: 08/29/17  7:18 PM  Result Value Ref Range   Color, Urine YELLOW YELLOW   APPearance CLOUDY (A) CLEAR   Specific Gravity, Urine 1.019 1.005 - 1.030   pH 6.0 5.0 - 8.0   Glucose, UA NEGATIVE NEGATIVE mg/dL   Hgb urine dipstick SMALL (A) NEGATIVE   Bilirubin Urine NEGATIVE NEGATIVE   Ketones, ur NEGATIVE NEGATIVE mg/dL   Protein, ur NEGATIVE NEGATIVE mg/dL   Nitrite NEGATIVE NEGATIVE   Leukocytes, UA LARGE (A) NEGATIVE   RBC / HPF 6-30 0 - 5 RBC/hpf   WBC, UA TOO NUMEROUS TO COUNT 0 - 5 WBC/hpf   Bacteria, UA MANY (A) NONE SEEN   Squamous Epithelial / LPF NONE SEEN NONE SEEN   WBC Clumps PRESENT    Mucus PRESENT    Sperm, UA PRESENT     Comment: Performed at [facility], [address], [place], [place] [address]     HEENT: scalp incision CDI, mild ptosis left eye Cardio: RRR without murmur. No JVD  Resp: CTA Bilaterally without wheezes or rales. Normal effort  GI: BS positive and abd pocket for bone flap incision CDI, no abd denterness Musculoskeletal:  No Edema. No tenderness Skin:   Wound C/D/I and abd and scalp Neuro: Alert/Oriented,  Motor: Right upper extremity/right lower extremity: 0/5 proximal to distal (stable) Sensation absent to light touch right upper extremity/right  lower extremity Tone:  Increased R pec, wrist flexors  clonus R ankle  Gen NAD. Vital signs reviewed.  Assessment/Plan: 1. Functional deficits secondary to Left MCA infarct which require 3+ hours per day of interdisciplinary therapy in a comprehensive inpatient rehab setting. Physiatrist is providing close team supervision and 24 hour management of active medical problems listed below. Physiatrist and rehab team continue to assess barriers to discharge/monitor patient progress toward functional and medical goals. FIM: Function - Bathing Position: Shower Body parts bathed by patient: Right arm, Left arm, Chest, Abdomen, Front perineal area, Right upper leg, Left upper leg, Left lower leg Body parts bathed by helper: Right lower leg, Back, Buttocks Assist Level: Touching or steadying assistance(Pt > 75%)  Function- Upper Body Dressing/Undressing What is the patient wearing?: Pull over shirt/dress Pull over shirt/dress - Perfomed by patient: Thread/unthread left sleeve, Put head through opening, Pull shirt over trunk, Thread/unthread right sleeve Pull over shirt/dress - Perfomed by helper: Thread/unthread right sleeve Assist Level: Touching or steadying assistance(Pt > 75%) Function - Lower Body Dressing/Undressing What is the patient wearing?: Pants, Non-skid slipper socks, Underwear Position: Other (comment)(Tub bench) Underwear - Performed by patient: Thread/unthread left underwear leg Underwear - Performed by helper: Thread/unthread right underwear leg, Thread/unthread left underwear leg, Pull underwear up/down Pants- Performed by patient: Thread/unthread left pants leg, Pull pants up/down Pants- Performed by helper: Thread/unthread right pants leg, Thread/unthread left pants leg, Pull pants up/down  Non-skid slipper socks- Performed by patient: Don/doff left sock Non-skid slipper socks- Performed by helper: Don/doff right sock, Don/doff left sock Socks - Performed by patient: Don/doff  right sock, Don/doff left sock Socks - Performed by helper: Don/doff right sock Shoes - Performed by patient: Don/doff left shoe Shoes - Performed by helper: Don/doff right shoe, Fasten right, Fasten left AFO - Performed by helper: Don/doff right AFO Assist for footwear: Maximal assist Assist for lower body dressing: Touching or steadying assistance (Pt > 75%)  Function - Toileting Toileting activity did not occur: No continent bowel/bladder event Toileting steps completed by helper: Performs perineal hygiene, Adjust clothing after toileting, Adjust clothing prior to toileting Toileting Assistive Devices: Grab bar or rail Assist level: Two helpers  Function - ArchivistToilet Transfers Toilet transfer activity did not occur: Safety/medical concerns Toilet transfer assistive device: Bedside commode Mechanical lift: Stedy Assist level to toilet: Touching or steadying assistance (Pt > 75%) Assist level from toilet: Touching or steadying assistance (Pt > 75%) Assist level to bedside commode (at bedside): Touching or steadying assistance (Pt > 75%) Assist level from bedside commode (at bedside): Touching or steadying assistance (Pt > 75%)  Function - Chair/bed transfer Chair/bed transfer method: Squat pivot Chair/bed transfer assist level: Touching or steadying assistance (Pt > 75%) Chair/bed transfer assistive device: Armrests Mechanical lift: Stedy Chair/bed transfer details: Manual facilitation for placement, Manual facilitation for weight bearing, Verbal cues for technique, Verbal cues for sequencing, Manual facilitation for weight shifting, Verbal cues for safe use of DME/AE  Function - Locomotion: Wheelchair Will patient use wheelchair at discharge?: Yes Type: Manual Max wheelchair distance: 120' Assist Level: Supervision or verbal cues Assist Level: Supervision or verbal cues Assist Level: Supervision or verbal cues Turns around,maneuvers to table,bed, and toilet,negotiates 3%  grade,maneuvers on rugs and over doorsills: No Function - Locomotion: Ambulation Ambulation activity did not occur: Safety/medical concerns Assistive device: Walker-hemi Max distance: 5440ft  Assist level: Maximal assist (Pt 25 - 49%) Walk 10 feet activity did not occur: Safety/medical concerns Assist level: Maximal assist (Pt 25 - 49%) Walk 50 feet with 2 turns activity did not occur: Safety/medical concerns Assist level: 2 helpers Walk 10 feet on uneven surfaces activity did not occur: Safety/medical concerns  Function - Comprehension Comprehension: Auditory Comprehension assist level: Follows complex conversation/direction with extra time/assistive device, Understands complex 90% of the time/cues 10% of the time  Function - Expression Expression: Verbal Expression assist level: Expresses complex 90% of the time/cues < 10% of the time, Expresses complex ideas: With extra time/assistive device  Function - Social Interaction Social Interaction assist level: Interacts appropriately 90% of the time - Needs monitoring or encouragement for participation or interaction.  Function - Problem Solving Problem solving assist level: Solves complex 90% of the time/cues < 10% of the time, Solves complex problems: With extra time  Function - Memory Memory assist level: Recognizes or recalls 90% of the time/requires cueing < 10% of the time Patient normally able to recall (first 3 days only): Current season, Staff names and faces, That he or she is in a hospital, Location of own room  Medical Problem List and Plan: 1.Dense right hemiparesis and aphasia/dysphagiasecondary to left large MCA infarction/left M1 occlusion/ACA infarction status post TPA with unsuccessful attempt at mechanical thrombectomy. Status post decompressive hemicraniectomy placement of bone flap abdominal pocket 07/29/2017. Will order protective helmet Continue CIR   LOS extended to 2/22, allows for family training,  may need to may additional med adjustments prior to d/c  Continue  PT OT speech making improvements with movement although poor proprioception > patient unaware 2. DVT Prophylaxis/Anticoagulation: Subcutaneous Lovenox initiated 07/31/2017. Venous Doppler studies negative 3. Pain Management:Tylenol as needed, pain behind left eye discussed with optho, not likely due to pressure on optic nerve as that will cause loss of vision but not pain.  Treating HA with topiramate, 25mg  increase to BID  Likely related to his cerebral edema.        Repeat CT showed improvement 4. Mood:Provide emotional support.Patient with coping issues due to his diagnosis and physical losses. Social worker to screen. Neuropsychology to be involved as well. Now on Ritalin and Lexapro, may try off Ritalin prior to d/c 5. Neuropsych: This patient isnot completelycapable of making decisions on hisown behalf. 6. Skin/Wound Care:Routine skin checks, skin healing well scalp and abd, s/p staple removal 7. Fluids/Electrolytes/Nutrition:Routine I&O's- Eats 100% meals    8.Dysphagia. Advanced to regular thin diet on 2/7  9.Hypertension.Monitor with increased mobility Vitals:   08/29/17 1350 08/30/17 0500  BP: 116/81 129/89  Pulse: 97 80  Resp: 16 16  Temp: 98.8 F (37.1 C) 98 F (36.7 C)  SpO2: 98% 100%    Controlled 2/14 10.Hyperlipidemia. Zetia/Lipitor 11. Elevated transanimase likely lipitor but < 3x ULN, monitor 12.  Bladder incontinence nocturnal UA +, Cx Pnd start Keflex empirically 13.  Bowel incontinence resolved.  14. Leukocytosis    Afebrile   White blood cells down to 10.0 on 08/27/2017 15. Acute blood loss anemia   Hemoglobin 11.7 on 1/23   Hemoglobin 12.6 on 08/27/2017  LOS (Days) 24 A FACE TO FACE EVALUATION WAS PERFORMED  Erick Colace 08/31/2017, 6:52 AM

## 2017-08-31 NOTE — Progress Notes (Signed)
Speech Language Pathology Daily Session Note  Patient Details  Name: Jeremy [name] MRN: [id number] Date of Birth: 10-Feb-1976  Today's Date: 08/31/2017 SLP Individual Time: 1400-1500 SLP Individual Time Calculation (min): 60 min  Short Term Goals: Week 4: SLP Short Term Goal 1 (Week 4): Pt will tolerate regular textures without overt s/s of aspiration at Mod I.  SLP Short Term Goal 2 (Week 4): Given Supervision A cues, pt will scan to right of midline to locate objects during semi-complex tasks.  SLP Short Term Goal 3 (Week 4): Pt will complete semi-complex tasks with supervision cues.  SLP Short Term Goal 4 (Week 4): Pt demonstrate anticipatory awareness and identify 3 tasks he can participate in at home safely with Supervision A verbal and question cues. SLP Short Term Goal 5 (Week 4): Pt will demonstrate selective attention in moderately distracting environment for ~ 45 minutes with Supervision A verbal and question cues.  SLP Short Term Goal 6 (Week 4): Pt will utilize external memory aids to recall new information at Mod I.   Skilled Therapeutic Interventions: Skilled treatment session focused on cognition goals. SLP facilitated session by providing more than a reasonable amount of time to complete reasoning tasks with visual map. Pt with accurate answers but required more time to problem solve answers. Visual model provided at regular size and no visual difficulties noted. Pt required question cues but provided correct answers for tasks that would be safe vs. Unsafe within home environment. Pt left upright in wheelchair with mother present. Continue per current plan of care.      Function:    Cognition Comprehension Comprehension assist level: Follows complex conversation/direction with extra time/assistive device;Understands complex 90% of the time/cues 10% of the time  Expression   Expression assist level: Expresses complex 90% of the time/cues < 10% of the time;Expresses complex ideas:  With extra time/assistive device  Social Interaction Social Interaction assist level: Interacts appropriately 90% of the time - Needs monitoring or encouragement for participation or interaction.  Problem Solving Problem solving assist level: Solves complex 90% of the time/cues < 10% of the time;Solves complex problems: With extra time  Memory Memory assist level: Recognizes or recalls 90% of the time/requires cueing < 10% of the time    Pain    Therapy/Group: Individual Therapy  Shanikqua Zarzycki 08/31/2017, 3:15 PM

## 2017-08-31 NOTE — Progress Notes (Signed)
Occupational Therapy Session Note  Patient Details  Name: Jeremy [name] MRN: [id number] Date of Birth: [birth year]  Today's Date: 08/31/2017 OT Individual Time: 0945-1100 OT Individual Time Calculation (min): 75 min    Short Term Goals: Week 4:  OT Short Term Goal 1 (Week 4): STG=LTG due to LOS  Skilled Therapeutic Interventions/Progress Updates:    Pt seen for OT session focusing on ADL re-training and functional standing balance. Pt received sitting on toilet upon arrival with mother present, hand off to NT. Pt able to sit on toilet with distant supervision. Pt unable to void. He stood at [employer] from toilet with mod A. R UE placed in hand splint and pt requiring steadying assist and +2 for safety for total A toileting task. Pt's mother assisted with mod A squat pivot transfer back to w/c.  Pt refused to complete grooming tasks in standing this session, completed oral and hand hygiene from w/c level at sink. Pt able to don L shoe/sock and assist provided for R. In therapy day room, pt completed corn hole game, standing at RW to toss bean bags. Pt able to power up into standing with min A, required mod A initially for balance and total A to place R hand in splint. Completed x3 trials throughout session with dynamic standing balance progressing to min A with R knee blocked and min cuing for midline orientation awareness. Pt refusing use of mirror in front of him for visual feedback. Pt able to tolerate ~1-2 minutes of dynamic standing before requiring seated rest break. Max A required for controlled descent into chair. Pt requires VCs for attention to B foot placement prior to each stand. Pt self propelled w/c to/from room to therapy day room with supervision and mod-max VCs for awareness to environmental obstacles on R. Pt returned to room with mother at end of session.    Therapy Documentation Precautions:  Precautions Precautions: Fall Precaution Comments: hemicraniectomy placement of bone flap  abdominal pocket Required Braces or Orthoses: Other Brace/Splint Other Brace/Splint: Helmet when OOB Restrictions Weight Bearing Restrictions: No Pain: Pain Assessment Pain Assessment: No/denies pain  See Function Navigator for Current Functional Status.   Therapy/Group: Individual Therapy  Sharran Caratachea L 08/31/2017, 7:04 AM

## 2017-09-01 ENCOUNTER — Inpatient Hospital Stay (HOSPITAL_COMMUNITY): Payer: BLUE CROSS/BLUE SHIELD | Admitting: Physical Therapy

## 2017-09-01 DIAGNOSIS — N390 Urinary tract infection, site not specified: Secondary | ICD-10-CM

## 2017-09-01 DIAGNOSIS — A499 Bacterial infection, unspecified: Secondary | ICD-10-CM

## 2017-09-01 NOTE — Progress Notes (Signed)
Subjective/Complaints: Pt just woke up. No new complaints. Denies headaches/pain  ROS: pt denies nausea, vomiting, diarrhea, cough, shortness of breath or chest pain    Objective: Vital Signs: Blood pressure 138/84, pulse 82, temperature 98.2 F (36.8 C), temperature source Oral, resp. rate 18, weight 85 kg (187 lb 6.3 oz), SpO2 100 %. No results found. Results for orders placed or performed during the hospital encounter of 08/07/17 (from the past 72 hour(s))  Urine Culture     Status: Abnormal (Preliminary result)   Collection Time: 08/29/17  6:59 PM  Result Value Ref Range   Specimen Description URINE, CLEAN CATCH    Special Requests NONE    Culture (A)     >=100,000 COLONIES/mL STAPHYLOCOCCUS SPECIES (COAGULASE NEGATIVE) SUSCEPTIBILITIES TO FOLLOW Performed at [facility], [address] [place], [place] [address]    Report Status PENDING   Urinalysis, Routine w reflex microscopic     Status: Abnormal   Collection Time: 08/29/17  7:18 PM  Result Value Ref Range   Color, Urine YELLOW YELLOW   APPearance CLOUDY (A) CLEAR   Specific Gravity, Urine 1.019 1.005 - 1.030   pH 6.0 5.0 - 8.0   Glucose, UA NEGATIVE NEGATIVE mg/dL   Hgb urine dipstick SMALL (A) NEGATIVE   Bilirubin Urine NEGATIVE NEGATIVE   Ketones, ur NEGATIVE NEGATIVE mg/dL   Protein, ur NEGATIVE NEGATIVE mg/dL   Nitrite NEGATIVE NEGATIVE   Leukocytes, UA LARGE (A) NEGATIVE   RBC / HPF 6-30 0 - 5 RBC/hpf   WBC, UA TOO NUMEROUS TO COUNT 0 - 5 WBC/hpf   Bacteria, UA MANY (A) NONE SEEN   Squamous Epithelial / LPF NONE SEEN NONE SEEN   WBC Clumps PRESENT    Mucus PRESENT    Sperm, UA PRESENT     Comment: Performed at [facility], [address], [place], [place] [address]     HEENT: scalp incision CDI, mild ptosis left eye Cardio: RRR without murmur. No JVD   Resp: CTA Bilaterally without wheezes or rales. Normal effort   GI: BS positive and abd pocket for bone flap incision CDI, no abd  denterness Musculoskeletal:  No Edema. No tenderness Skin:   Wound C/D/I and abd and scalp Neuro: Alert/Oriented,  Motor: Right upper extremity/right lower extremity: 0/5 proximal to distal (no change) Sensation absent to light touch right upper extremity/right lower extremity Tone:  Increased R pec, wrist flexors  clonus R ankle  Gen NAD. Vital signs reviewed.  Assessment/Plan: 1. Functional deficits secondary to Left MCA infarct which require 3+ hours per day of interdisciplinary therapy in a comprehensive inpatient rehab setting. Physiatrist is providing close team supervision and 24 hour management of active medical problems listed below. Physiatrist and rehab team continue to assess barriers to discharge/monitor patient progress toward functional and medical goals. FIM: Function - Bathing Position: Shower Body parts bathed by patient: Right arm, Left arm, Chest, Abdomen, Front perineal area, Right upper leg, Left upper leg, Left lower leg Body parts bathed by helper: Right lower leg, Back, Buttocks Assist Level: Touching or steadying assistance(Pt > 75%)  Function- Upper Body Dressing/Undressing What is the patient wearing?: Pull over shirt/dress Pull over shirt/dress - Perfomed by patient: Thread/unthread left sleeve, Put head through opening, Pull shirt over trunk, Thread/unthread right sleeve Pull over shirt/dress - Perfomed by helper: Thread/unthread right sleeve Assist Level: Touching or steadying assistance(Pt > 75%) Function - Lower Body Dressing/Undressing What is the patient wearing?: Pants, Non-skid slipper socks, Underwear  Position: Other (comment)(Tub bench) Underwear - Performed by patient: Thread/unthread left underwear leg Underwear - Performed by helper: Thread/unthread right underwear leg, Thread/unthread left underwear leg, Pull underwear up/down Pants- Performed by patient: Thread/unthread left pants leg, Pull pants up/down Pants- Performed by helper:  Thread/unthread right pants leg, Thread/unthread left pants leg, Pull pants up/down Non-skid slipper socks- Performed by patient: Don/doff left sock Non-skid slipper socks- Performed by helper: Don/doff right sock, Don/doff left sock Socks - Performed by patient: Don/doff right sock, Don/doff left sock Socks - Performed by helper: Don/doff right sock Shoes - Performed by patient: Don/doff left shoe Shoes - Performed by helper: Don/doff right shoe, Fasten right, Fasten left AFO - Performed by helper: Don/doff right AFO Assist for footwear: Maximal assist Assist for lower body dressing: Touching or steadying assistance (Pt > 75%)  Function - Toileting Toileting activity did not occur: No continent bowel/bladder event Toileting steps completed by helper: Adjust clothing prior to toileting, Performs perineal hygiene, Adjust clothing after toileting Toileting Assistive Devices: Grab bar or rail Assist level: Two helpers  Function - ArchivistToilet Transfers Toilet transfer activity did not occur: Safety/medical concerns Toilet transfer assistive device: Bedside commode Mechanical lift: Stedy Assist level to toilet: Touching or steadying assistance (Pt > 75%) Assist level from toilet: Touching or steadying assistance (Pt > 75%) Assist level to bedside commode (at bedside): Touching or steadying assistance (Pt > 75%) Assist level from bedside commode (at bedside): Touching or steadying assistance (Pt > 75%)  Function - Chair/bed transfer Chair/bed transfer method: Squat pivot Chair/bed transfer assist level: Touching or steadying assistance (Pt > 75%) Chair/bed transfer assistive device: Armrests Mechanical lift: Stedy Chair/bed transfer details: Manual facilitation for placement, Manual facilitation for weight bearing, Verbal cues for technique, Verbal cues for sequencing, Manual facilitation for weight shifting, Verbal cues for safe use of DME/AE  Function - Locomotion: Wheelchair Will patient  use wheelchair at discharge?: Yes Type: Manual Max wheelchair distance: 120' Assist Level: Supervision or verbal cues Assist Level: Supervision or verbal cues Assist Level: Supervision or verbal cues Turns around,maneuvers to table,bed, and toilet,negotiates 3% grade,maneuvers on rugs and over doorsills: No Function - Locomotion: Ambulation Ambulation activity did not occur: Safety/medical concerns Assistive device: Walker-hemi Max distance: 2440ft  Assist level: Maximal assist (Pt 25 - 49%) Walk 10 feet activity did not occur: Safety/medical concerns Assist level: Maximal assist (Pt 25 - 49%) Walk 50 feet with 2 turns activity did not occur: Safety/medical concerns Assist level: 2 helpers Walk 10 feet on uneven surfaces activity did not occur: Safety/medical concerns  Function - Comprehension Comprehension: Auditory Comprehension assist level: Follows complex conversation/direction with extra time/assistive device, Understands complex 90% of the time/cues 10% of the time  Function - Expression Expression: Verbal Expression assist level: Expresses complex 90% of the time/cues < 10% of the time, Expresses complex ideas: With extra time/assistive device  Function - Social Interaction Social Interaction assist level: Interacts appropriately 90% of the time - Needs monitoring or encouragement for participation or interaction.  Function - Problem Solving Problem solving assist level: Solves complex 90% of the time/cues < 10% of the time, Solves complex problems: With extra time  Function - Memory Memory assist level: Recognizes or recalls 90% of the time/requires cueing < 10% of the time Patient normally able to recall (first 3 days only): Current season, Location of own room, Staff names and faces, That he or she is in a hospital  Medical Problem List and Plan: 1.Dense right hemiparesis and aphasia/dysphagiasecondary to left  large MCA infarction/left M1 occlusion/ACA infarction  status post TPA with unsuccessful attempt at mechanical thrombectomy. Status post decompressive hemicraniectomy placement of bone flap abdominal pocket 07/29/2017. Will order protective helmet Continue CIR   LOS extended to 2/22, allows for family training, may need to may additional med adjustments prior to d/c  Continue PT OT speech making improvements with movement although poor proprioception > patient unaware 2. DVT Prophylaxis/Anticoagulation: Subcutaneous Lovenox initiated 07/31/2017. Venous Doppler studies negative 3. Pain Management:Tylenol as needed, pain behind left eye discussed with optho, not likely due to pressure on optic nerve as that will cause loss of vision but not pain.  Treating HA with topiramate, 25mg  increase to BID  Likely related to his cerebral edema.        Repeat CT showed improvement 4. Mood:Provide emotional support.Patient with coping issues due to his diagnosis and physical losses. Social worker to screen. Neuropsychology to be involved as well. Now on Ritalin and Lexapro, consider trial off Ritalin prior to d/c 5. Neuropsych: This patient isnot completelycapable of making decisions on hisown behalf. 6. Skin/Wound Care:Routine skin checks, skin healing well scalp and abd, s/p staple removal 7. Fluids/Electrolytes/Nutrition:Routine I&O's- Eats 100% meals    8.Dysphagia. Advanced to regular thin diet on 2/7  9.Hypertension.Monitor with increased mobility Vitals:   08/31/17 1506 09/01/17 0520  BP: (!) 133/93 138/84  Pulse: 89 82  Resp: 16 18  Temp: 98.4 F (36.9 C) 98.2 F (36.8 C)  SpO2: 100% 100%    Controlled 2/14 10.Hyperlipidemia. Zetia/Lipitor 11. Elevated transanimase likely lipitor but < 3x ULN, monitor 12.  Bladder incontinence nocturnal. UCX with 100k Staph Aureus, continue Keflex empirically 13.  Bowel incontinence resolved.  14. Leukocytosis    Afebrile   White blood cells down to 10.0 on 08/27/2017 15. Acute blood  loss anemia   Hemoglobin 11.7 on 1/23   Hemoglobin 12.6 on 08/27/2017  LOS (Days) 25 A FACE TO FACE EVALUATION WAS PERFORMED  SWARTZ,ZACHARY T 09/01/2017, 1:25 PM

## 2017-09-01 NOTE — Progress Notes (Signed)
Physical Therapy Session Note  Patient Details  Name: Jeremy [name] MRN: [id number] Date of Birth: 07-May-1976  Today's Date: 09/01/2017 PT Individual Time: 1300-1415 PT Individual Time Calculation (min): 75 min   Short Term Goals: Week 4:  PT Short Term Goal 1 (Week 4): STG=LTG due to ELOS  Skilled Therapeutic Interventions/Progress Updates:      Pt received sitting in WC and agreeable to PT  Sit<>stand transfer x 2 with min assist from mother, min cues from PT for proper cueing to improve safety of transfer. Lateral followed by cross body Reaching task with mother providing min assist. Instruction from PT to improve safety awareness and how to instruct for safety.   Gait training x 25f with mod assist overall and RW with hand splint. Max cues from PT for improved gait pattern including lateral weight shift to the L to allow R swing through. Pt able to perform swing through on the LLE with assist to remove WB from LLE.    nustep reciprocal movement training, BLE only x 4 min with min assist to sustain neutral hip IR/ER. Pt very distracted on this day and unable to sustain reciprocal movement pattern for >45 seconds.   Dynamic balance training with visual feedback for Weight shifting with wii balance board. Penguin slide x 3. Table tilt x 4. Min-mod assist for use of ankle strategy to improve COM translation. Moderate cues for attention to the LLE and improved knee extension to maintain proper WB to allow weight shift.     WC mobility instructed by PT with R hemi technique to return to room with supervision assist. [facility] returned to [facility] [place] [facility] and all needs met.     Therapy Documentation Precautions:  Precautions Precautions: Fall Precaution Comments: hemicraniectomy placement of bone flap abdominal pocket Required Braces or Orthoses: Other Brace/Splint Other Brace/Splint: Helmet when OOB Restrictions Weight Bearing Restrictions: No     Pain:denies.    See Function Navigator for Current Functional Status.   Therapy/Group: Individual Therapy  ALorie Phenix2/16/2019, 2:19 PM

## 2017-09-02 ENCOUNTER — Inpatient Hospital Stay (HOSPITAL_COMMUNITY): Payer: BLUE CROSS/BLUE SHIELD | Admitting: Occupational Therapy

## 2017-09-02 LAB — URINE CULTURE: Culture: >=100000 — AB

## 2017-09-02 NOTE — Progress Notes (Signed)
Occupational Therapy Session Note  Patient Details  Name: Jeremy [name] MRN: [id number] Date of Birth: 27-Mar-1976  Today's Date: 09/02/2017 OT Individual Time: 1020-1115 OT Individual Time Calculation (min): 55 min    Short Term Goals: Week 4:  OT Short Term Goal 1 (Week 4): STG=LTG due to LOS  Skilled Therapeutic Interventions/Progress Updates:    Pt seen for OT session focusing on ADL re-training and functional transfers. Pt sitting up in w/c upon arrival, agreeable to tx session. With encouragement, pt willing to complete oral care in standing. Min A to power into standing at sink, R UE placed in weightbearing position while pt brushed teeth. He required mod A overall for static standing balance, however, with attention and VCs to task pt able to weigthshift to L and stand with min A. He required increased assist for controlled descent into chair.  He doffed t-shirt independently and with min A and VCs for problem solving, pt able to don new shirt, correctly recalling and implementing hemi dressing techniques.  Kinesiotape re-applied to pt's R shoulder to address subluxation. He self propelled w/c to ADL apartment with VCs for awareness to environmental obstacles on R and increased time. Pt required mod-max cuing for proper set-p and positioning of w/c in prep for transfer to low soft couch. Transfer completed at overall min A level with guarding assist throughout and VCs for hand placement.  While seated on couch, attempted to engage pt in conversation regarding d/c and aspects he was looking forward to or anxious about. Pt difficulty to engage though he did say things would be different now due to his brain injury and requires more time with things due to slower processing speed.  Pt returned to room at end of session, left seated in w/c with all needs in reach.    Therapy Documentation Precautions:  Precautions Precautions: Fall Precaution Comments: hemicraniectomy placement of bone flap  abdominal pocket Required Braces or Orthoses: Other Brace/Splint Other Brace/Splint: Helmet when OOB Restrictions Weight Bearing Restrictions: No Pain:   No/ denies pain  See Function Navigator for Current Functional Status.   Therapy/Group: Individual Therapy  Cleavon Goldman L 09/02/2017, 6:47 AM
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
## 2017-09-03 ENCOUNTER — Inpatient Hospital Stay (HOSPITAL_COMMUNITY): Payer: BLUE CROSS/BLUE SHIELD | Admitting: Physical Therapy

## 2017-09-03 ENCOUNTER — Inpatient Hospital Stay (HOSPITAL_COMMUNITY): Payer: BLUE CROSS/BLUE SHIELD

## 2017-09-03 ENCOUNTER — Inpatient Hospital Stay (HOSPITAL_COMMUNITY): Payer: BLUE CROSS/BLUE SHIELD | Admitting: Occupational Therapy

## 2017-09-03 MED ORDER — METHYLPHENIDATE HCL 5 MG PO TABS
5.0000 mg | ORAL_TABLET | Freq: Every day | ORAL | Status: DC
  Administered 2017-09-03 – 2017-09-05 (×3): 5 mg via ORAL
  Filled 2017-09-03 (×4): qty 1

## 2017-09-03 NOTE — Progress Notes (Signed)
Speech Language Pathology Daily Session Note  Patient Details  Name: Jeremy [name] MRN: [id number] Date of Birth: 1975/08/21  Today's Date: 09/03/2017 SLP Individual Time: [id number] SLP Individual Time Calculation (min): 57 min  Short Term Goals: Week 4: SLP Short Term Goal 1 (Week 4): Pt will tolerate regular textures without overt s/s of aspiration at Mod I.  SLP Short Term Goal 2 (Week 4): Given Supervision A cues, pt will scan to right of midline to locate objects during semi-complex tasks.  SLP Short Term Goal 3 (Week 4): Pt will complete semi-complex tasks with supervision cues.  SLP Short Term Goal 4 (Week 4): Pt demonstrate anticipatory awareness and identify 3 tasks he can participate in at home safely with Supervision A verbal and question cues. SLP Short Term Goal 5 (Week 4): Pt will demonstrate selective attention in moderately distracting environment for ~ 45 minutes with Supervision A verbal and question cues.  SLP Short Term Goal 6 (Week 4): Pt will utilize external memory aids to recall new information at Mod I.   Skilled Therapeutic Interventions:Skilled ST services focused on cognitive skills. SLP facilitated PO consumption of regular textured foods and thin liquids during breakfast tray, pt demonstrated Mod I use of swallow strategies with appropriate rate. SLP facilitated semi-complex problem solving skills with deductive reasoning tasks, pt required Min-Supervision A verbal and question cues with extra time for problem solving, Supervision A question cues to monitor/correct errors and extra time for working memory. SLP facilitated anticipatory awareness, reviewing challenges during daily routine from start to finish, pt required supervision A question to expand response. Pt was left in room with call bell within reach. Recommend to continue skilled ST services.      Function:  Eating Eating   Modified Consistency Diet: No Eating Assist Level: Set up assist for   Eating Set  Up Assist For: Opening containers       Cognition Comprehension Comprehension assist level: Understands complex 90% of the time/cues 10% of the time  Expression   Expression assist level: Expresses complex 90% of the time/cues < 10% of the time  Social Interaction Social Interaction assist level: Interacts appropriately 90% of the time - Needs monitoring or encouragement for participation or interaction.  Problem Solving Problem solving assist level: Solves complex 90% of the time/cues < 10% of the time;Solves basic problems with no assist  Memory Memory assist level: Recognizes or recalls 90% of the time/requires cueing < 10% of the time    Pain Pain Assessment Pain Assessment: No/denies pain  Therapy/Group: Individual Therapy  Darcus Edds  [facility] 09/03/2017, 2:36 PM

## 2017-09-03 NOTE — Progress Notes (Signed)
Occupational Therapy Session Note  Patient Details  Name: Cletus GashJao Vonruden MRN: [id number] Date of Birth: [birth year]  Today's Date: 09/03/2017 OT Individual Time: [id number] OT Individual Time Calculation (min): 60 min    Short Term Goals: Week 4:  OT Short Term Goal 1 (Week 4): STG=LTG due to LOS  Skilled Therapeutic Interventions/Progress Updates:    Pt seen for OT session focusing on functional transfers and neuro re-ed. Pt sitting up in w/c upon arrival with mother present, agreeable to tx session.  He self propelled w/c towards ADL apartment, demonstrated approve awareness to environmental obstacles on R though self propelled at very slow speed despite VCs for propulsion technique.  In apartment, pt completed x2 w/c <> low soft surface couch, first trial therapist assisting and second trial with mother assisting. Pt completed transfers at overall min A for transfer to w/c with assist for positioning of hips. Required mod A for initiation to power forward from low couch.  Pt then taken to therapy gym. Completed  Arm skate activity at high/low table addressing elbow flexion/extention in gravity eliminated position. Trace movements felts in both directions.  Pt then transitioned to side-lying on mat and completed shoulder flexion/extension in gravity eliminated position utilizing arm skate. Pt demonstrated partial ROM in shoulder extension. Sensory testing completed, pt demonstrates no light touch, pain or proprioceptive abilities in R UE. Education provided to pt and caregiver regarding decreased sensation and functional implications. Pt returned to room at end of session with assist from mom.    Therapy Documentation  Precautions:  Precautions Precautions: Fall Precaution Comments: hemicraniectomy placement of bone flap abdominal pocket Required Braces or Orthoses: Other Brace/Splint Other Brace/Splint: Helmet when OOB Restrictions Weight Bearing Restrictions: No Pain:   No/ denies  pain  See Function Navigator for Current Functional Status.   Therapy/Group: Individual Therapy  Jarris Kortz L 09/03/2017, 7:11 AM

## 2017-09-03 NOTE — Progress Notes (Signed)
Subjective/Complaints: Discussed med managemnt, no new issues over weekend  ROS: pt denies nausea, vomiting, diarrhea, cough, shortness of breath or chest pain    Objective: Vital Signs: Blood pressure 129/88, pulse 62, temperature 98.1 F (36.7 C), temperature source Oral, resp. rate 17, height 5\' 10"  (1.778 m), weight 85 kg (187 lb 6.3 oz), SpO2 100 %. No results found. No results found for this or any previous visit (from the past 72 hour(s)).   HEENT: scalp incision CDI, mild ptosis left eye Cardio: RRR without murmur. No JVD    Resp: CTA Bilaterally without wheezes or rales. Normal effort   GI: BS positive and abd pocket for bone flap incision CDI, no abd denterness Musculoskeletal:  No Edema. No tenderness Skin:   Wound C/D/I and abd and scalp Neuro: Alert/Oriented,  Motor: Right upper extremity/right lower extremity: 0/5 proximal to distal (no change) Sensation absent to light touch right upper extremity/right lower extremity Tone:  Increased R pec, wrist flexors  clonus R ankle  Gen NAD. Vital signs reviewed.  Assessment/Plan: 1. Functional deficits secondary to Left MCA infarct which require 3+ hours per day of interdisciplinary therapy in a comprehensive inpatient rehab setting. Physiatrist is providing close team supervision and 24 hour management of active medical problems listed below. Physiatrist and rehab team continue to assess barriers to discharge/monitor patient progress toward functional and medical goals. FIM: Function - Bathing Position: Shower Body parts bathed by patient: Right arm, Left arm, Chest, Abdomen, Front perineal area, Right upper leg, Left upper leg, Left lower leg Body parts bathed by helper: Right lower leg, Back, Buttocks Assist Level: Touching or steadying assistance(Pt > 75%)  Function- Upper Body Dressing/Undressing What is the patient wearing?: Pull over shirt/dress Pull over shirt/dress - Perfomed by patient: Thread/unthread left  sleeve, Put head through opening, Pull shirt over trunk, Thread/unthread right sleeve Pull over shirt/dress - Perfomed by helper: Thread/unthread right sleeve Assist Level: Touching or steadying assistance(Pt > 75%) Function - Lower Body Dressing/Undressing What is the patient wearing?: Pants, Non-skid slipper socks, Underwear Position: Other (comment)(Tub bench) Underwear - Performed by patient: Thread/unthread left underwear leg Underwear - Performed by helper: Thread/unthread right underwear leg, Thread/unthread left underwear leg, Pull underwear up/down Pants- Performed by patient: Thread/unthread left pants leg, Pull pants up/down Pants- Performed by helper: Thread/unthread right pants leg, Thread/unthread left pants leg, Pull pants up/down Non-skid slipper socks- Performed by patient: Don/doff left sock Non-skid slipper socks- Performed by helper: Don/doff right sock, Don/doff left sock Socks - Performed by patient: Don/doff right sock, Don/doff left sock Socks - Performed by helper: Don/doff right sock Shoes - Performed by patient: Don/doff left shoe Shoes - Performed by helper: Don/doff right shoe, Fasten right, Fasten left AFO - Performed by helper: Don/doff right AFO Assist for footwear: Maximal assist Assist for lower body dressing: Touching or steadying assistance (Pt > 75%)  Function - Toileting Toileting activity did not occur: No continent bowel/bladder event Toileting steps completed by helper: Adjust clothing prior to toileting, Performs perineal hygiene, Adjust clothing after toileting Toileting Assistive Devices: Grab bar or rail Assist level: Two helpers  Function - ArchivistToilet Transfers Toilet transfer activity did not occur: Safety/medical concerns Toilet transfer assistive device: Engineer, civil (consulting)Grab bar Mechanical lift: Stedy Assist level to toilet: Touching or steadying assistance (Pt > 75%) Assist level from toilet: Touching or steadying assistance (Pt > 75%) Assist level to  bedside commode (at bedside): Touching or steadying assistance (Pt > 75%) Assist level from bedside commode (at bedside): Touching or  steadying assistance (Pt > 75%)  Function - Chair/bed transfer Chair/bed transfer method: Squat pivot Chair/bed transfer assist level: Touching or steadying assistance (Pt > 75%) Chair/bed transfer assistive device: Armrests Mechanical lift: Stedy Chair/bed transfer details: Manual facilitation for placement, Manual facilitation for weight bearing, Verbal cues for technique, Verbal cues for sequencing, Manual facilitation for weight shifting, Verbal cues for safe use of DME/AE  Function - Locomotion: Wheelchair Will patient use wheelchair at discharge?: Yes Type: Manual Max wheelchair distance: 131ft Assist Level: Supervision or verbal cues Assist Level: Supervision or verbal cues Assist Level: Supervision or verbal cues Turns around,maneuvers to table,bed, and toilet,negotiates 3% grade,maneuvers on rugs and over doorsills: No Function - Locomotion: Ambulation Ambulation activity did not occur: Safety/medical concerns Assistive device: Walker-rolling Max distance: 69ft Assist level: Moderate assist (Pt 50 - 74%) Walk 10 feet activity did not occur: Safety/medical concerns Assist level: Moderate assist (Pt 50 - 74%) Walk 50 feet with 2 turns activity did not occur: Safety/medical concerns Assist level: 2 helpers Walk 10 feet on uneven surfaces activity did not occur: Safety/medical concerns  Function - Comprehension Comprehension: Auditory Comprehension assist level: Understands complex 90% of the time/cues 10% of the time  Function - Expression Expression: Verbal Expression assist level: Expresses complex 90% of the time/cues < 10% of the time  Function - Social Interaction Social Interaction assist level: Interacts appropriately 90% of the time - Needs monitoring or encouragement for participation or interaction.  Function - Problem  Solving Problem solving assist level: Solves complex 90% of the time/cues < 10% of the time, Solves basic problems with no assist  Function - Memory Memory assist level: Recognizes or recalls 75 - 89% of the time/requires cueing 10 - 24% of the time Patient normally able to recall (first 3 days only): Current season, Location of own room, Staff names and faces, That he or she is in a hospital  Medical Problem List and Plan: 1.Dense right hemiparesis and aphasia/dysphagiasecondary to left large MCA infarction/left M1 occlusion/ACA infarction status post TPA with unsuccessful attempt at mechanical thrombectomy. Status post decompressive hemicraniectomy placement of bone flap abdominal pocket 07/29/2017. Will order protective helmet Continue CIR    - LOS extended to 2/22, cont PT, OT,SLP 2. DVT Prophylaxis/Anticoagulation: Subcutaneous Lovenox initiated 07/31/2017. Venous Doppler studies negative 3. Pain Management:Tylenol as needed, pain behind left eye discussed with optho, not likely due to pressure on optic nerve as that will cause loss of vision but not pain.  Treating HA with topiramate, 25mg  increase to BID  Likely related to his cerebral edema.        Repeat CT showed improvement 4. Mood:Provide emotional support.Patient with coping issues due to his diagnosis and physical losses. Social worker to screen. Neuropsychology to be involved as well. Now on Ritalin and Lexapro, d/c pm dose Ritalin monitor effect 5. Neuropsych: This patient isnot completelycapable of making decisions on hisown behalf. 6. Skin/Wound Care:Routine skin checks, skin healing well scalp and abd, s/p staple removal 7. Fluids/Electrolytes/Nutrition:Routine I&O's- Eats 100% meals    8.Dysphagia. Advanced to regular thin diet on 2/7  9.Hypertension.Monitor with increased mobility Vitals:   09/02/17 0445 09/03/17 0154  BP: 127/81 129/88  Pulse: 81 62  Resp: 18 17  Temp: 98.4 F (36.9 C)  98.1 F (36.7 C)  SpO2: 96% 100%    Controlled 2/18 10.Hyperlipidemia. Zetia/Lipitor 11. Elevated transanimase likely lipitor but < 3x ULN, monitor 12.  Bladder incontinence nocturnal. Staph epi likely contaminant will d/c keflex13.  Bowel incontinence resolved.  14. Leukocytosis    Afebrile   White blood cells down to 10.0 on 08/27/2017 15. Acute blood loss anemia   Hemoglobin 11.7 on 1/23   Hemoglobin 12.6 on 08/27/2017  LOS (Days) 27 A FACE TO FACE EVALUATION WAS PERFORMED  Erick Colace 09/03/2017, 7:42 AM

## 2017-09-03 NOTE — Progress Notes (Signed)
Physical Therapy Session Note  Patient Details  Name: Jeremy GashJao [name] MRN: [id number] Date of Birth: [birth year]  Today's Date: 09/03/2017 PT Individual Time: 1420-1530 PT Individual Time Calculation (min): 70 min   Short Term Goals: Week 4:  PT Short Term Goal 1 (Week 4): STG=LTG due to ELOS  Skilled Therapeutic Interventions/Progress Updates: Pt presented in w/c with mother and wife present. Pt propelled to rehab gym, pt requiring constant re-direction and increased time due to possible internal distraction. Gait training performed with RW. Pt ambulated 5010ft x2 with max verbal cues for sequencing and PTA providing total assist fo advancing RLE. Pt noted to demonstrate resistance when PTA advancing RLE at time pushing down with front of foot. Pt with x 2 occurences of poor foot placement with difficulty self correcting and x 1 occurrence requiring PTA physical assist to avid LOB. Pt noted to require frequent re-direction throughout session. Pt transported to day room and participated in NuStep L1 for attempted sustained task. Pt unable to perform reciprocal activity despite max cues for more than 3 cycles. Pt returned to w/c and propelled back to room requiring max cues and distracted as earlier in session. Pt returned to room and remained in w/c with mom and wife present.      Therapy Documentation Precautions:  Precautions Precautions: Fall Precaution Comments: hemicraniectomy placement of bone flap abdominal pocket Required Braces or Orthoses: Other Brace/Splint Other Brace/Splint: Helmet when OOB Restrictions Weight Bearing Restrictions: No General:   Vital Signs:   Pain: Pain Assessment Pain Assessment: No/denies pain   See Function Navigator for Current Functional Status.   Therapy/Group: Individual Therapy  Sue Mcalexander 09/03/2017, 4:40 PM

## 2017-09-04 ENCOUNTER — Inpatient Hospital Stay (HOSPITAL_COMMUNITY): Payer: BLUE CROSS/BLUE SHIELD | Admitting: Occupational Therapy

## 2017-09-04 ENCOUNTER — Inpatient Hospital Stay (HOSPITAL_COMMUNITY): Payer: BLUE CROSS/BLUE SHIELD

## 2017-09-04 ENCOUNTER — Encounter (HOSPITAL_COMMUNITY): Payer: BLUE CROSS/BLUE SHIELD | Admitting: Psychology

## 2017-09-04 ENCOUNTER — Inpatient Hospital Stay (HOSPITAL_COMMUNITY): Payer: BLUE CROSS/BLUE SHIELD | Admitting: Physical Therapy

## 2017-09-04 LAB — CREATININE, SERUM
Creatinine, Ser: 1.1 mg/dL (ref 0.61–1.24)
GFR calc Af Amer: >60 mL/min (ref >60)

## 2017-09-04 NOTE — Progress Notes (Signed)
Physical Therapy Session Note  Patient Details  Name: Jeremy [name] MRN: [id number] Date of Birth: February 09, 1976  Today's Date: 09/04/2017 PT Individual Time: 1300-1405 PT Individual Time Calculation (min): 65 min   Short Term Goals: Week 4:  PT Short Term Goal 1 (Week 4): STG=LTG due to ELOS  Skilled Therapeutic Interventions/Progress Updates: Pt presented in w/c with other and wife present agreeable to therapy. Session focused on functional transfers and mobility. Pt propelled to ortho gym demonstrating improved negotiation of obstacle avoidance on L side. Pt required reduced reinforcement than previous session. Pt and mom performed car transfer via stand pivot with good safety and pt appropriately following commands. Pt propelled to ADL apt with pt and mom performing stand pivot with mom providing max to total assist for bed mobility. Provided family edu regarding allowing pt to perform as much mobility as possible to allow for continued progression of functional mobility. Mom and wife voiced understanding. Pt participated in gait training with improved initiation this session. Pt required total assist for RLE advancement and intermittent instances of pushing through toe. Pt transported to day room and attempted Wii fit and balance board. Pt able to step up with LLE however c/o pain in RLE. Pt unable to complete after severa attempts. Pt returned to room and remained in w/c with family present.        Therapy Documentation Precautions:  Precautions Precautions: Fall Precaution Comments: hemicraniectomy placement of bone flap abdominal pocket Required Braces or Orthoses: Other Brace/Splint Other Brace/Splint: Helmet when OOB Restrictions Weight Bearing Restrictions: No General:   Vital Signs: Therapy Vitals Temp: 98.2 F (36.8 C) Temp Source: Oral Pulse Rate: 85 Resp: 20 BP: 114/67 Patient Position (if appropriate): Sitting Oxygen Therapy SpO2: 100 % O2 Device: Not  Delivered Pain: Pain Assessment Pain Assessment: No/denies pain Mobility:   Locomotion :    Trunk/Postural Assessment :    Balance:   Exercises:   Other Treatments:     See Function Navigator for Current Functional Status.   Therapy/Group: Individual Therapy  Meko Bellanger  Sheila Gervasi, PTA  09/04/2017, 4:33 PM

## 2017-09-04 NOTE — Progress Notes (Signed)
Speech Language Pathology Daily Session Note  Patient Details  Name: Jeremy [name] MRN: [id number] Date of Birth: 1975/09/03  Today's Date: 09/04/2017 SLP Individual Time: 0802-0900 SLP Individual Time Calculation (min): 58 min  Short Term Goals: Week 4: SLP Short Term Goal 1 (Week 4): Pt will tolerate regular textures without overt s/s of aspiration at Mod I.  SLP Short Term Goal 2 (Week 4): Given Supervision A cues, pt will scan to right of midline to locate objects during semi-complex tasks.  SLP Short Term Goal 3 (Week 4): Pt will complete semi-complex tasks with supervision cues.  SLP Short Term Goal 4 (Week 4): Pt demonstrate anticipatory awareness and identify 3 tasks he can participate in at home safely with Supervision A verbal and question cues. SLP Short Term Goal 5 (Week 4): Pt will demonstrate selective attention in moderately distracting environment for ~ 45 minutes with Supervision A verbal and question cues.  SLP Short Term Goal 6 (Week 4): Pt will utilize external memory aids to recall new information at Mod I.   Skilled Therapeutic Interventions: Skilled ST services focused on swallow and cognitive skills. SLP facilitated PO consumption of regular textured foods and thin liquids during breakfast tray, pt demonstrated Mod I use of swallow strategies to slow rate. SLP facilitated semi-complex problem solving skills with deductive reasoning task, pt required supervision A question cues for problem solving and error monitoring/correcting. Pt required supervision A question cues to scan to left in functional task. SLP facilitated anticipatory awareness skills when listing activities that are safe and not safe to participate in upon going home, pt required supervision A question cues and redirection from distracting behavior (listing initially activties that were unsafe as safe to do, but attempting he was joking." SLP reviewed the importance of appropriate actions and responses during  therapy in order to have effective sessions towards independent goals, pt stated agreement. Pt was left in room with call bell within reach. Reccomend to continue skilled ST services.      Function:  Eating Eating   Modified Consistency Diet: No Eating Assist Level: Set up assist for   Eating Set Up Assist For: Opening containers       Cognition Comprehension Comprehension assist level: Understands complex 90% of the time/cues 10% of the time  Expression   Expression assist level: Expresses complex 90% of the time/cues < 10% of the time  Social Interaction Social Interaction assist level: Interacts appropriately 90% of the time - Needs monitoring or encouragement for participation or interaction.  Problem Solving Problem solving assist level: Solves complex 90% of the time/cues < 10% of the time  Memory Memory assist level: Recognizes or recalls 90% of the time/requires cueing < 10% of the time    Pain Pain Assessment Pain Assessment: No/denies pain  Therapy/Group: Individual Therapy  Mylinda Brook  [facility] 09/04/2017, 2:18 PM

## 2017-09-04 NOTE — Progress Notes (Signed)
Occupational Therapy Session Note  Patient Details  Name: Jeremy [name] MRN: [id number] Date of Birth: 1976-05-08  Today's Date: 09/04/2017 OT Individual Time: 1055-1200 OT Individual Time Calculation (min): 65 min  and Today's Date: 09/04/2017 OT Missed Time: 10 Minutes Missed Time Reason: Other (comment)(Pt visitors)   Short Term Goals: Week 4:  OT Short Term Goal 1 (Week 4): STG=LTG due to LOS  Skilled Therapeutic Interventions/Progress Updates:    Pt with visitors upon arrival, requesting therapist to return in several minutes, pt missed 10 minutes of therapy due to visitors.   Pt seen for OT ADL bathing/dressing session and caregiver training with pt's mother. Shower task completed in ADL apartment utilizing tub/shower combination and tub transfer bench in simulation of home environment. Pt's mother provided assist for all transfers during session, completed via squat pivot with min A. Pt bathed seated on tub transfer bench, demonstrating improved static and dynamic sitting balance, maintaining functional sitting balance throughout bathing task with supervision. Dressing completed total A while pt sat on tub bench for safety, he stood with use of grab bars with min A, and maintained static standing balance with L UE support while pants pulled up. Following shower, pt taken to therapy gym. Completed Connect 4 game in standing position at high/low table with R UE placed and supported in weightbearing position. Pt maintaining static standing balance with overall min A, occasional mod when reaching across midline to obtain game piece. Pt self propelled w/c back to room at end of session, improved attention for awareness of environmental obstacles on R. Pt left in w/c with all needs in reach and family members present.   Therapy Documentation Precautions:  Precautions Precautions: Fall Precaution Comments: hemicraniectomy placement of bone flap abdominal pocket Required Braces or Orthoses:  Other Brace/Splint Other Brace/Splint: Helmet when OOB Restrictions Weight Bearing Restrictions: No Pain:    No/denies pain  See Function Navigator for Current Functional Status.   Therapy/Group: Individual Therapy  Eartha Vonbehren L 09/04/2017, 7:06 AM

## 2017-09-04 NOTE — Progress Notes (Signed)
Subjective/Complaints: Slept ok, no eye pain  ROS: pt denies nausea, vomiting, diarrhea, cough, shortness of breath or chest pain    Objective: Vital Signs: Blood pressure (!) 130/94, pulse 81, temperature 98.1 F (36.7 C), temperature source Oral, resp. rate 15, height _0  (1.778 m), weight 85 kg (187 lb 6.3 oz), SpO2 100 %. No results found. Results for orders placed or performed during the hospital encounter of 08/07/17 (from the past 72 hour(s))  Creatinine, serum     Status: None   Collection Time: 09/04/17  5:23 AM  Result Value Ref Range   Creatinine, Ser 1.10 0.61 - 1.24 mg/dL   GFR calc non Af Amer >60 >60 mL/min   GFR calc Af Amer >60 >60 mL/min    Comment: (NOTE) The eGFR has been calculated using the CKD EPI equation. This calculation has not been validated in all clinical situations. eGFR's persistently <60 mL/min signify possible Chronic Kidney Disease. Performed at [facility], [facility] [address], [place], [place] [address]      HEENT: scalp incision CDI, mild ptosis left eye Cardio: RRR without murmur. No JVD    Resp: CTA Bilaterally without wheezes or rales. Normal effort   GI: BS positive and abd pocket for bone flap incision CDI, no abd denterness Musculoskeletal:  No Edema. No tenderness Skin:   Wound C/D/I and abd and scalp Neuro: Alert/Oriented,  Motor: Right upper extremity/right lower extremity: 0/5 proximal to distal (no change) Sensation absent to light touch right upper extremity/right lower extremity Tone:  Increased R pec, wrist flexors  clonus R ankle  Gen NAD. Vital signs reviewed.  Assessment/Plan: 1. Functional deficits secondary to Left MCA infarct which require 3+ hours per day of interdisciplinary therapy in a comprehensive inpatient rehab setting. Physiatrist is providing close team supervision and 24 hour management of active medical problems listed below. Physiatrist and rehab team continue to assess barriers to  discharge/monitor patient progress toward functional and medical goals. FIM: Function - Bathing Position: Shower Body parts bathed by patient: Right arm, Left arm, Chest, Abdomen, Front perineal area, Right upper leg, Left upper leg, Left lower leg Body parts bathed by helper: Right lower leg, Back, Buttocks Assist Level: Touching or steadying assistance(Pt > 75%)  Function- Upper Body Dressing/Undressing What is the patient wearing?: Pull over shirt/dress Pull over shirt/dress - Perfomed by patient: Thread/unthread left sleeve, Put head through opening, Pull shirt over trunk, Thread/unthread right sleeve Pull over shirt/dress - Perfomed by helper: Thread/unthread right sleeve Assist Level: Touching or steadying assistance(Pt > 75%) Function - Lower Body Dressing/Undressing What is the patient wearing?: Pants, Non-skid slipper socks, Underwear Position: Other (comment)(Tub bench) Underwear - Performed by patient: Thread/unthread left underwear leg Underwear - Performed by helper: Thread/unthread right underwear leg, Thread/unthread left underwear leg, Pull underwear up/down Pants- Performed by patient: Thread/unthread left pants leg, Pull pants up/down Pants- Performed by helper: Thread/unthread right pants leg, Thread/unthread left pants leg, Pull pants up/down Non-skid slipper socks- Performed by patient: Don/doff left sock Non-skid slipper socks- Performed by helper: Don/doff right sock, Don/doff left sock Socks - Performed by patient: Don/doff right sock, Don/doff left sock Socks - Performed by helper: Don/doff right sock Shoes - Performed by patient: Don/doff left shoe Shoes - Performed by helper: Don/doff right shoe, Fasten right, Fasten left AFO - Performed by helper: Don/doff right AFO Assist for footwear: Maximal assist Assist for lower body dressing: Touching or steadying assistance (Pt > 75%)  Function - Toileting Toileting activity did not  occur: No continent bowel/bladder  event Toileting steps completed by helper: Adjust clothing prior to toileting, Performs perineal hygiene, Adjust clothing after toileting Toileting Assistive Devices: Grab bar or rail Assist level: Two helpers  Function - Air cabin crew transfer activity did not occur: Safety/medical concerns Toilet transfer assistive device: Radio broadcast assistant lift: Stedy Assist level to toilet: Touching or steadying assistance (Pt > 75%) Assist level from toilet: Touching or steadying assistance (Pt > 75%) Assist level to bedside commode (at bedside): Touching or steadying assistance (Pt > 75%) Assist level from bedside commode (at bedside): Touching or steadying assistance (Pt > 75%)  Function - Chair/bed transfer Chair/bed transfer method: Squat pivot Chair/bed transfer assist level: Touching or steadying assistance (Pt > 75%) Chair/bed transfer assistive device: Armrests Mechanical lift: Stedy Chair/bed transfer details: Manual facilitation for placement, Manual facilitation for weight bearing, Verbal cues for technique, Verbal cues for sequencing, Manual facilitation for weight shifting, Verbal cues for safe use of DME/AE  Function - Locomotion: Wheelchair Will patient use wheelchair at discharge?: Yes Type: Manual Max wheelchair distance: 170f Assist Level: Supervision or verbal cues Assist Level: Supervision or verbal cues Assist Level: Supervision or verbal cues Turns around,maneuvers to table,bed, and toilet,negotiates 3% grade,maneuvers on rugs and over doorsills: No Function - Locomotion: Ambulation Ambulation activity did not occur: Safety/medical concerns Assistive device: Walker-rolling Max distance: 27fAssist level: Moderate assist (Pt 50 - 74%) Walk 10 feet activity did not occur: Safety/medical concerns Assist level: Moderate assist (Pt 50 - 74%) Walk 50 feet with 2 turns activity did not occur: Safety/medical concerns Assist level: 2 helpers Walk 10 feet on uneven  surfaces activity did not occur: Safety/medical concerns  Function - Comprehension Comprehension: Auditory Comprehension assist level: Understands complex 90% of the time/cues 10% of the time  Function - Expression Expression: Verbal Expression assist level: Expresses complex 90% of the time/cues < 10% of the time  Function - Social Interaction Social Interaction assist level: Interacts appropriately 90% of the time - Needs monitoring or encouragement for participation or interaction.  Function - Problem Solving Problem solving assist level: Solves complex 90% of the time/cues < 10% of the time, Solves basic problems with no assist  Function - Memory Memory assist level: Recognizes or recalls 90% of the time/requires cueing < 10% of the time Patient normally able to recall (first 3 days only): Current season, Location of own room, Staff names and faces, That he or she is in a hospital  Medical Problem List and Plan: 1.Dense right hemiparesis and aphasia/dysphagiasecondary to left large MCA infarction/left M1 occlusion/ACA infarction status post TPA with unsuccessful attempt at mechanical thrombectomy. Status post decompressive hemicraniectomy placement of bone flap abdominal pocket 07/29/2017. Will order protective helmet Continue CIR    - LOS extended to 2/22, cont PT, OT,SLP 2. DVT Prophylaxis/Anticoagulation: Subcutaneous Lovenox initiated 07/31/2017. Venous Doppler studies negative 3. Pain Management:Tylenol as needed, pain behind left eye discussed with optho, not likely due to pressure on optic nerve as that will cause loss of vision but not pain.  Treating HA with topiramate, 2528mncrease to BID  Likely related to his cerebral edema.        Repeat CT showed improvement 4. Mood:Provide emotional support.Patient with coping issues due to his diagnosis and physical losses. Social worker to screen. Neuropsychology to be involved as well. Now on Ritalin and  Lexapro, d/c pm dose Ritalin monitor effect 5. Neuropsych: This patient isnot completelycapable of making decisions on hisown behalf. 6. Skin/Wound Care:Routine skin  checks, skin healing well scalp and abd, s/p staple removal 7. Fluids/Electrolytes/Nutrition:Routine I&O's- Eats 100% meals    8.Dysphagia. Advanced to regular thin diet on 2/7  9.Hypertension.Monitor with increased mobility Vitals:   09/03/17 0154 09/04/17 0620  BP: 129/88 (!) 130/94  Pulse: 62 81  Resp: 17 15  Temp: 98.1 F (36.7 C) 98.1 F (36.7 C)  SpO2: 100% 100%    Controlled 2/18 10.Hyperlipidemia. Zetia/Lipitor 11. Elevated transanimase likely lipitor but < 3x ULN, monitor 12.  Bladder incontinence nocturnal. Staph epi likely contaminant will d/c keflex13.  Bowel incontinence resolved.  14. Leukocytosis    Afebrile   White blood cells down to 10.0 on 08/27/2017 15. Acute blood loss anemia   Hemoglobin 11.7 on 1/23   Hemoglobin 12.6 on 08/27/2017  LOS (Days) 28 A FACE TO FACE EVALUATION WAS PERFORMED  Jeremy [name] 09/04/2017, 7:50 AM

## 2017-09-04 NOTE — Plan of Care (Signed)
  RH BOWEL ELIMINATION RH STG MANAGE BOWEL W/MEDICATION W/ASSISTANCE Description STG Manage Bowel with Medication and min Assistance.  09/04/2017 1206 - Progressing by Thurston HoleBennett, Konstance Happel, RN   RH SKIN INTEGRITY RH STG MAINTAIN SKIN INTEGRITY WITH ASSISTANCE Description STG Maintain Skin Integrity With max Assistance.  09/04/2017 1206 - Progressing by Thurston HoleBennett, Shary Lamos, RN   RH SAFETY RH STG ADHERE TO SAFETY PRECAUTIONS W/ASSISTANCE/DEVICE Description STG Adhere to Safety Precautions With max Assistance and appropriate assistive Device.  09/04/2017 1206 - Progressing by Thurston HoleBennett, Victorya Hillman, RN   RH COGNITION-NURSING RH STG USES MEMORY AIDS/STRATEGIES W/ASSIST TO PROBLEM SOLVE Description STG Uses Memory Aids and Strategies With min Assistance to Problem Solve.  09/04/2017 1206 - Progressing by Thurston HoleBennett, Diani Jillson, RN

## 2017-09-04 NOTE — Plan of Care (Signed)
  Progressing RH BOWEL ELIMINATION RH STG MANAGE BOWEL WITH ASSISTANCE Description STG Manage Bowel with mod Assistance.  09/04/2017 0301 - Progressing by Marigene EhlersGonzalez, Yadier Bramhall, RN RH STG MANAGE BOWEL W/MEDICATION W/ASSISTANCE Description STG Manage Bowel with Medication and min Assistance.  09/04/2017 0301 - Progressing by Marigene EhlersGonzalez, Micaella Gitto, RN RH BLADDER ELIMINATION RH STG MANAGE BLADDER WITH ASSISTANCE Description STG Manage Bladder With mod Assistance  09/04/2017 0301 - Progressing by Marigene EhlersGonzalez, Morell Mears, RN RH SKIN INTEGRITY RH STG SKIN FREE OF INFECTION/BREAKDOWN Description Skin to remain free from infection and breakdown while on rehab with max assist.  09/04/2017 0301 - Progressing by Marigene EhlersGonzalez, Kanton Kamel, RN RH SAFETY RH STG ADHERE TO SAFETY PRECAUTIONS W/ASSISTANCE/DEVICE Description STG Adhere to Safety Precautions With max Assistance and appropriate assistive Device.  09/04/2017 0301 - Progressing by Marigene EhlersGonzalez, Dozier Berkovich, RN

## 2017-09-04 NOTE — Consult Note (Signed)
Neuropsychological Consultation   Patient:   Jeremy [name]   DOB:   03/24/1976  MR Number:  [id number]  [facility]  [facility] [address] [id number] [place] [id number] Dept: [phone] Loc: [phone]           Date of Service:   09/04/2017  Start Time:   2 PM End Time:   3 PM  Provider/Observer:  Arley PhenixJohn Rodenbough, Psy.D.       Clinical Neuropsychologist       Billing Code/Service: [phone] 4 Units  Chief Complaint:    Jeremy [name] is a 42 year old male with history of hypertension but no antihypertensive medications.  Presented 07/27/2017 with right sided weakness, headache, facial droop and slurred speech.  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  Cerebral edema with midline shift developed.  Decompressive hemicraniectomy performed.  Patient has shown improving aphasia/expressive language symptoms and near complete right arm and right leg motor impairments.  Impulse control (almost completely verbal in nature) is noted.  Verbally resistant and negative interactions during some therapy sessions noted.  Receptive language functions appear to be intact.  Depressive and anxiety symptoms also present due to sudden loss of function.  Reason for Service:  Jeremy [name] was referred for neuropsychological/psycholoigcal consultation due to coping and adjustment issues and issues/concerns about motivation and verbal resistance to therapy and instructions.  Below is the HPI for the current admission.  HPI:Jeremy [name] a 42 y.o.right handed malewith history of hypertension on no antihypertensive medications. Presented 07/27/2017 with right-sided weakness, headache,facial droop and slurred speech. Per chart review patient lives with spouse.They have a 40-year-old son as well as wife is currently pregnant.One level home/thirdthird floor apartmentand they are currently seeking an apartment ground-level.Good  family support in the area.Independent and working prior to admission. Cranial CT scan showed left insula and frontal operculum lucency compatible with infarction.echocardiogram with ejection fraction of 65% no wall motion abnormalities. No defect or PFO identified. CT Angiography head and neck showed left M1 occlusion with intermediate left MCA distribution collateralization. Underwentattemptedmechanical thrombectomy per interventional radiologywhichwas unsuccessful and required intubation. Follow-up MRI showed cerebral edema with midline shift maintained on 3% saline. Underwent left decompressive hemicraniectomy placement of bone flap abdominal pocket 07/29/2017 per Dr.Nundkumar.Initially maintained onCleviprex.Patient was extubated 06/02/2018 NPOwith tube feeds for nutritional supportand diet advanced to a dysphagia #1 nectar thick liquid. Follow-up CT 08/03/2017 stable no new changes.Bilateral lower extremity Dopplers negative.TEE was attempted 08/06/2017 but unable to perform.No plan for loop recorder.Subcutaneous Lovenox was later initiated for DVT prophylaxis. Physicaland occupationaltherapy evaluationscompletedwithrecommendations of physical medicine rehabilitation consult.Patient was admitted for a comprehensive rehabilitation program  Current Status:  Reports from both the family and the patient himself are that there have been significant improvement in his interactions with others.  He is less negative and less verbally inappropriate to others.  The patient reports that he is feeling like he is improving.  He was able to support his weight and walk some last Friday but the past few days he has not been able to repeat.  He reports that there have been more pain sensations over past few days but realizes that these are likely part of the increased work with those areas and part of the healing process.  Mood improved.    Behavioral Observation: Jeremy [name]  presents as a 42  y.o.-year-old Right handed Male who appeared his stated age. his dress was Appropriate and he was Well Groomed  and his manners were Appropriate, inappropriate at times to the situation.  his participation was indicative of Appropriate, Redirectable and Resistant behaviors.  There were any physical disabilities noted.  he displayed an appropriate level of cooperation and motivation.     Interactions:    Active Appropriate and Redirectable  Attention:   abnormal and attention span appeared shorter than expected for age  Memory:   abnormal; remote memory intact, recent memory impaired  Visuo-spatial:  within normal limits  Speech (Volume):  low  Speech:   slurred;   Thought Process:  Coherent and Relevant  Though Content:  WNL; not suicidal  Orientation:   person, place, time/date and situation  Judgment:   Poor  Planning:   Poor  Affect:    Anxious, Defensive and Depressed  Mood:    Anxious, Depressed and Irritable  Insight:   Shallow  Intelligence:   normal  Marital Status/Living: The patient is married and has a 75 year old son and his wife is pregnant (due in April) as will as having his mother come down from [place] to help for a period of time.   Medical History:   Past Medical History:  Diagnosis Date  . Anxiety   . Asthma    MILD AS A CHILD  . Depression   . Deviated septum   . Dyspnea    NORMAL SPIROMETRY 02/01/17 VISIT WITH DR HEDRICK  . Hypertension   . Sleep apnea         Psychiatric History:  Patient has prior diagnosis of both depression and anxiety in the past.  Family Med/Psych History: No family history on file.  Risk of Suicide/Violence: low At this point the patient has severe impairments in right side motor functioning.  While he can be very negative verbally and even threaten physical assault upon others or be verbally critical, this is likely more of his prior behavioral patterns magnified following left frontal lobe brain injury.     Impression/DX:  Jeremy [name] is a 42 year old male with history of hypertension but no antihypertensive medications.  Presented 07/27/2017 with right sided weakness, headache, facial droop and slurred speech.  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  Cerebral edema with midline shift developed.  Decompressive hemicraniectomy performed.  Patient has shown improving aphasia/expressive language symptoms and near complete right arm and right leg motor impairments.  Impulse control (almost completely verbal in nature) is noted.  Verbally resistant and negative interactions during some therapy sessions noted.  Receptive language functions appear to be intact.  Depressive and anxiety symptoms also present due to sudden loss of function.  Reports from the family and treatment team members all suggest that the patient is doing much better as far as his mood and interaction with others.  He has been less critical of family members.  I did talk to both his wife as well as his sister-in-law about prior personality states.  They reports that while he is always been very sarcastic and cutting in his comments they were not nothing like the negativity that he had been verbalizing more recently.  The patient is being much more appropriate in his interaction with his wife, mother, and son.  The patient does report that he feels a little bit more down recently but denies depression and reports that he feels like it is just frustration with his loss of function as he becomes more aware.  There have been some concerns about observing slowed response time and slowed information  processing speed.  They have also been some concerns about recent bladder accidents at night while sleeping.  The report that this is a relatively new symptom.  The patient also reports that he is having more muscle spasms on the left side of his body.  The patient continues to report regular headaches.   Reports from both the family and the  patient himself are that there have been significant improvement in his interactions with others.  He is less negative and less verbally inappropriate to others.  The patient reports that he is feeling like he is improving.  He was able to support his weight and walk some last Friday but the past few days he has not been able to repeat.  He reports that there have been more pain sensations over past few days but realizes that these are likely part of the increased work with those areas and part of the healing process.  Mood improved.     Diagnosis:    Motor and executive functioning deficits        Electronically Signed   _______________________ Arley Phenix, Psy.D.

## 2017-09-05 ENCOUNTER — Ambulatory Visit (HOSPITAL_COMMUNITY): Payer: BLUE CROSS/BLUE SHIELD | Admitting: Physical Therapy

## 2017-09-05 ENCOUNTER — Inpatient Hospital Stay (HOSPITAL_COMMUNITY): Payer: BLUE CROSS/BLUE SHIELD | Admitting: Occupational Therapy

## 2017-09-05 ENCOUNTER — Inpatient Hospital Stay (HOSPITAL_COMMUNITY): Payer: BLUE CROSS/BLUE SHIELD | Admitting: Physical Therapy

## 2017-09-05 ENCOUNTER — Inpatient Hospital Stay (HOSPITAL_COMMUNITY): Payer: BLUE CROSS/BLUE SHIELD | Admitting: Speech Pathology

## 2017-09-05 MED ORDER — METHYLPHENIDATE HCL 5 MG PO TABS
5.0000 mg | ORAL_TABLET | Freq: Two times a day (BID) | ORAL | Status: DC
  Administered 2017-09-05 – 2017-09-07 (×5): 5 mg via ORAL
  Filled 2017-09-05 (×5): qty 1

## 2017-09-05 NOTE — Patient Care Conference (Signed)
Inpatient RehabilitationTeam Conference and Plan of Care Update Date: 09/05/2017   Time: 10:30 AM    Patient Name: Jeremy [name]      Medical Record Number: [id number]  Date of Birth: 1976-03-10 Sex: Male         Room/Bed: 4W12C/4W12C-01 Payor Info: Payor: [place] / Plan: [employer] OTHER / Product Type: *No Product type* /    Admitting Diagnosis: Stroke CVA  Admit Date/Time:  08/07/2017  8:36 PM Admission Comments: No comment available   Primary Diagnosis:  <principal problem not specified> Principal Problem: <principal problem not specified>  Patient Active Problem List   Diagnosis Date Noted  . Acute blood loss anemia   . Vascular headache   . Spastic hemiplegia of right dominant side as late effect of cerebral infarction ([place])   . Benign essential HTN   . Frontal lobe and executive function deficit following cerebral infarction   . Adjustment disorder with mixed anxiety and depressed mood   . Left middle cerebral artery stroke ([place]) 08/07/2017  . Aphasia due to acute stroke ([place]) 08/07/2017  . B12 deficiency   . Acute respiratory failure ([place])   . Central line infiltration ([place])   . Endotracheal tube present   . Fever   . Leukocytosis   . S/P craniotomy 07/30/2017  . Cerebral edema ([facility]) 07/28/2017  . Hyperlipidemia 07/28/2017  . Stroke (cerebrum) ([place]) 07/27/2017    Expected Discharge Date: Expected Discharge Date: 09/07/17  Team Members Present: Physician leading conference: Dr. Alysia Penna Social Worker Present: Ovidio Kin, LCSW Nurse Present: Other (comment)(Denise Lloyd-RN) PT Present: Rosita Dechalus, PTA;Barrie Folk, PT OT Present: Napoleon Form, OT SLP Present: Stormy Fabian, SLP PPS Coordinator present : Daiva Nakayama, RN, CRRN     Current Status/Progress Goal Weekly Team Focus  Medical   Right hemi, does better with Ritalin  Medically stable for D/C  D/C planning   Bowel/Bladder   Continent of B/B  Maintain continence  Assist with toileting as  needed   Swallow/Nutrition/ Hydration   Regular with thin - Mod I - intermittent supervision  Mod I - goal met  safe consumption of regular diet    ADL's   Min A functional transfers, min A static standing balance with UE support, mod A UB/LB dressing  min-mod A overall  Family education, neuro re-ed, functional standing balance/endurance, d/c planning   Mobility   supervision w/c  mobility, fair static balance with RW, modA stand pivot, minA squat pivot, maxA gait with RW and w/c follow  min assist trasnfers, supervision assist WC mobility, mod assist ambulation with LRAD   family ed d/c planninng   Communication   Mod I  Mod I - goal met  communicating well   Safety/Cognition/ Behavioral Observations  Min A to supervision A semi-complex/complex problem solving, right inattention, recall of new information  Min A to supervision - semi-complex   selective attention, problem solving, anticipatory awareness   Pain   C/O ongoing pain 7/10 at abdominal surgical site, not relieved by tylenol  Pain < 2  Assess Qshift and PRN   Skin   No new skin issues  Maintain skin integrity  assess qshift and PRN      *See Care Plan and progress notes for long and short-term goals.     Barriers to Discharge  Current Status/Progress Possible Resolutions Date Resolved   Physician    Medical stability  effort varies     Family training      Nursing  PT                    OT                  SLP                SW                Discharge Planning/Teaching Needs:  On going family education this week with Mom and wife. preparing for discharge on Friday, will begin with home health and transition to OP once appropriate      Team Discussion:  Progressing toward his goals and will reach by Friday discharge date. Abdominal pain-MD aware and has checked. Had decreased ritalin but less alert, will add back for home. Mom doing all of transfers and feels confident regarding pt's care for  home. Medically ready for discharge Friday.  Revisions to Treatment Plan:  DC 2/22    Continued Need for Acute Rehabilitation Level of Care: The patient requires daily medical management by a physician with specialized training in physical medicine and rehabilitation for the following conditions: Daily direction of a multidisciplinary physical rehabilitation program to ensure safe treatment while eliciting the highest outcome that is of practical value to the patient.: Yes Daily medical management of patient stability for increased activity during participation in an intensive rehabilitation regime.: Yes Daily analysis of laboratory values and/or radiology reports with any subsequent need for medication adjustment of medical intervention for : Neurological problems  Viveka Wilmeth, Gardiner Rhyme 09/05/2017, 12:43 PM

## 2017-09-05 NOTE — Plan of Care (Signed)
  Progressing RH BOWEL ELIMINATION RH STG MANAGE BOWEL WITH ASSISTANCE Description STG Manage Bowel with mod Assistance.  09/05/2017 0452 - Progressing by Marigene EhlersGonzalez, Mialynn Shelvin, RN RH BLADDER ELIMINATION RH STG MANAGE BLADDER WITH ASSISTANCE Description STG Manage Bladder With mod Assistance  09/05/2017 0452 - Progressing by Marigene EhlersGonzalez, Asami Lambright, RN RH SKIN INTEGRITY RH STG SKIN FREE OF INFECTION/BREAKDOWN Description Skin to remain free from infection and breakdown while on rehab with max assist.  09/05/2017 0452 - Progressing by Marigene EhlersGonzalez, Shellye Zandi, RN

## 2017-09-05 NOTE — Progress Notes (Signed)
Subjective/Complaints: Has worked on car transfers.  No pain complaints today  ROS: pt denies nausea, vomiting, diarrhea, cough, shortness of breath or chest pain    Objective: Vital Signs: Blood pressure 135/82, pulse 63, temperature 98.4 F (36.9 C), temperature source Oral, resp. rate 18, height 5' 10" (1.778 m), weight 85 kg (187 lb 6.3 oz), SpO2 100 %. No results found. Results for orders placed or performed during the hospital encounter of 08/07/17 (from the past 72 hour(s))  Creatinine, serum     Status: None   Collection Time: 09/04/17  5:23 AM  Result Value Ref Range   Creatinine, Ser 1.10 0.61 - 1.24 mg/dL   GFR calc non Af Amer >60 >60 mL/min   GFR calc Af Amer >60 >60 mL/min    Comment: (NOTE) The eGFR has been calculated using the CKD EPI equation. This calculation has not been validated in all clinical situations. eGFR's persistently <60 mL/min signify possible Chronic Kidney Disease. Performed at [facility], [place] [address] [place], [place] [address]      HEENT: scalp incision CDI, mild ptosis left eye Cardio: RRR without murmur. No JVD    Resp: CTA Bilaterally without wheezes or rales. Normal effort   GI: BS positive and abd pocket for bone flap incision CDI, no abd denterness Musculoskeletal:  No Edema. No tenderness Skin:   Wound C/D/I and abd and scalp Neuro: Alert/Oriented,  Motor: Right upper extremity/right lower extremity: 0/5 proximal to distal (no change) Sensation absent to light touch right upper extremity/right lower extremity Tone:  Increased R pec, wrist flexors  clonus R ankle  Gen NAD. Vital signs reviewed.  Assessment/Plan: 1. Functional deficits secondary to Left MCA infarct which require 3+ hours per day of interdisciplinary therapy in a comprehensive inpatient rehab setting. Physiatrist is providing close team supervision and 24 hour management of active medical problems listed below. Physiatrist and rehab team continue to  assess barriers to discharge/monitor patient progress toward functional and medical goals. FIM: Function - Bathing Position: Shower Body parts bathed by patient: Right arm, Left arm, Chest, Abdomen, Front perineal area, Right upper leg, Left upper leg Body parts bathed by helper: Right lower leg, Left lower leg, Buttocks Assist Level: Touching or steadying assistance(Pt > 75%)  Function- Upper Body Dressing/Undressing What is the patient wearing?: Pull over shirt/dress Pull over shirt/dress - Perfomed by patient: Thread/unthread left sleeve, Put head through opening, Pull shirt over trunk, Thread/unthread right sleeve Pull over shirt/dress - Perfomed by helper: Thread/unthread right sleeve, Thread/unthread left sleeve, Put head through opening, Pull shirt over trunk Assist Level: Touching or steadying assistance(Pt > 75%) Function - Lower Body Dressing/Undressing What is the patient wearing?: Pants, Non-skid slipper socks, Underwear Position: Other (comment)(Tub bench) Underwear - Performed by patient: Thread/unthread left underwear leg Underwear - Performed by helper: Thread/unthread right underwear leg, Thread/unthread left underwear leg, Pull underwear up/down Pants- Performed by patient: Thread/unthread left pants leg, Pull pants up/down Pants- Performed by helper: Thread/unthread right pants leg, Thread/unthread left pants leg, Pull pants up/down Non-skid slipper socks- Performed by patient: Don/doff left sock Non-skid slipper socks- Performed by helper: Don/doff right sock, Don/doff left sock Socks - Performed by patient: Don/doff right sock, Don/doff left sock Socks - Performed by helper: Don/doff right sock Shoes - Performed by patient: Don/doff left shoe Shoes - Performed by helper: Don/doff right shoe, Fasten right, Fasten left AFO - Performed by helper: Don/doff right AFO Assist for footwear: Maximal assist Assist for lower body dressing: Touching  or steadying assistance (Pt >  75%)  Function - Toileting Toileting activity did not occur: No continent bowel/bladder event Toileting steps completed by helper: Adjust clothing prior to toileting, Performs perineal hygiene, Adjust clothing after toileting Toileting Assistive Devices: Grab bar or rail Assist level: Two helpers  Function - Air cabin crew transfer activity did not occur: Safety/medical concerns Toilet transfer assistive device: Radio broadcast assistant lift: Stedy Assist level to toilet: Touching or steadying assistance (Pt > 75%) Assist level from toilet: Touching or steadying assistance (Pt > 75%) Assist level to bedside commode (at bedside): Touching or steadying assistance (Pt > 75%) Assist level from bedside commode (at bedside): Touching or steadying assistance (Pt > 75%)  Function - Chair/bed transfer Chair/bed transfer method: Squat pivot Chair/bed transfer assist level: Touching or steadying assistance (Pt > 75%) Chair/bed transfer assistive device: Armrests Mechanical lift: Stedy Chair/bed transfer details: Manual facilitation for placement, Manual facilitation for weight bearing, Verbal cues for technique, Verbal cues for sequencing, Manual facilitation for weight shifting, Verbal cues for safe use of DME/AE  Function - Locomotion: Wheelchair Will patient use wheelchair at discharge?: Yes Type: Manual Max wheelchair distance: 124f Assist Level: Supervision or verbal cues Assist Level: Supervision or verbal cues Assist Level: Supervision or verbal cues Turns around,maneuvers to table,bed, and toilet,negotiates 3% grade,maneuvers on rugs and over doorsills: No Function - Locomotion: Ambulation Ambulation activity did not occur: Safety/medical concerns Assistive device: Walker-rolling Max distance: 264fAssist level: Moderate assist (Pt 50 - 74%) Walk 10 feet activity did not occur: Safety/medical concerns Assist level: Moderate assist (Pt 50 - 74%) Walk 50 feet with 2 turns  activity did not occur: Safety/medical concerns Assist level: 2 helpers Walk 10 feet on uneven surfaces activity did not occur: Safety/medical concerns  Function - Comprehension Comprehension: Auditory Comprehension assist level: Understands complex 90% of the time/cues 10% of the time  Function - Expression Expression: Verbal Expression assist level: Expresses complex 90% of the time/cues < 10% of the time  Function - Social Interaction Social Interaction assist level: Interacts appropriately 90% of the time - Needs monitoring or encouragement for participation or interaction.  Function - Problem Solving Problem solving assist level: Solves complex 90% of the time/cues < 10% of the time  Function - Memory Memory assist level: Recognizes or recalls 90% of the time/requires cueing < 10% of the time Patient normally able to recall (first 3 days only): Current season, Location of own room, Staff names and faces, That he or she is in a hospital  Medical Problem List and Plan: 1.Dense right hemiparesis and aphasia/dysphagiasecondary to left large MCA infarction/left M1 occlusion/ACA infarction status post TPA with unsuccessful attempt at mechanical thrombectomy. Status post decompressive hemicraniectomy placement of bone flap abdominal pocket 07/29/2017. Will order protective helmet Team conference today please see physician documentation under team conference tab, met with team face-to-face to discuss problems,progress, and goals. Formulized individual treatment plan based on medical history, underlying problem and comorbidities.   - LOS extended to 2/22, cont PT, OT,SLP 2. DVT Prophylaxis/Anticoagulation: Subcutaneous Lovenox initiated 07/31/2017. Venous Doppler studies negative 3. Pain Management:Tylenol as needed, pain behind left eye discussed with optho, not likely due to pressure on optic nerve as that will cause loss of vision but not pain.  Treating HA with topiramate, 2564mincrease to BID  Likely related to his cerebral edema.        Repeat CT showed improvement 4. Mood:Provide emotional support.Patient with coping issues due to his diagnosis and physical losses.  Social worker to screen. Neuropsychology to be involved as well. Now on Ritalin and Lexapro, d/c pm dose Ritalin monitor effect-discuss with team 5. Neuropsych: This patient isnot completelycapable of making decisions on hisown behalf. 6. Skin/Wound Care:Routine skin checks, skin healing well scalp and abd, s/p staple removal 7. Fluids/Electrolytes/Nutrition:Routine I&O's- Eats 100% meals    8.Dysphagia. Advanced to regular thin diet on 2/7  9.Hypertension.Monitor with increased mobility Vitals:   09/04/17 1414 09/05/17 0517  BP: 114/67 135/82  Pulse: 85 63  Resp: 20 18  Temp: 98.2 F (36.8 C) 98.4 F (36.9 C)  SpO2: 100% 100%    Controlled 2/20 10.Hyperlipidemia. Zetia/Lipitor 11. Elevated transanimase likely lipitor but < 3x ULN, monitor 12.  Bladder incontinence nocturnal. Staph epi likely contaminant will d/c keflex13.  Bowel incontinence resolved.  14. Leukocytosis    Afebrile   White blood cells down to 10.0 on 08/27/2017 15. Acute blood loss anemia   Hemoglobin 11.7 on 1/23   Hemoglobin 12.6 on 08/27/2017  LOS (Days) 29 A FACE TO FACE EVALUATION WAS PERFORMED  Charlett Blake 09/05/2017, 10:03 AM

## 2017-09-05 NOTE — Progress Notes (Signed)
Speech Language Pathology Daily Session Note  Patient Details  Name: Jeremy [name] Cletus GashMRN: [id number] Date of Birth: [birth year]  Today's Date: 09/05/2017 SLP Individual Time: 0830-0900 SLP Individual Time Calculation (min): 30 min  Short Term Goals: Week 4: SLP Short Term Goal 1 (Week 4): Pt will tolerate regular textures without overt s/s of aspiration at Mod I.  SLP Short Term Goal 2 (Week 4): Given Supervision A cues, pt will scan to right of midline to locate objects during semi-complex tasks.  SLP Short Term Goal 3 (Week 4): Pt will complete semi-complex tasks with supervision cues.  SLP Short Term Goal 4 (Week 4): Pt demonstrate anticipatory awareness and identify 3 tasks he can participate in at home safely with Supervision A verbal and question cues. SLP Short Term Goal 5 (Week 4): Pt will demonstrate selective attention in moderately distracting environment for ~ 45 minutes with Supervision A verbal and question cues.  SLP Short Term Goal 6 (Week 4): Pt will utilize external memory aids to recall new information at Mod I.   Skilled Therapeutic Interventions: Skilled treatment session focused on cognition goals. SLP facilitated session by providing supervision cues to list anticipatory awareness within home environment Pt was left upright in bed with all needs within reach. Continue per current plan of care.      Function:  Eating Eating   Modified Consistency Diet: No Eating Assist Level: Set up assist for   Eating Set Up Assist For: Opening containers       Cognition Comprehension Comprehension assist level: Understands complex 90% of the time/cues 10% of the time;Follows complex conversation/direction with extra time/assistive device  Expression   Expression assist level: Expresses complex 90% of the time/cues < 10% of the time  Social Interaction Social Interaction assist level: Interacts appropriately 90% of the time - Needs monitoring or encouragement for participation or  interaction.  Problem Solving Problem solving assist level: Solves complex 90% of the time/cues < 10% of the time;Solves complex problems: With extra time  Memory Memory assist level: Recognizes or recalls 90% of the time/requires cueing < 10% of the time    Pain    Therapy/Group: Individual Therapy  Hatsumi Steinhart 09/05/2017, 12:25 PM

## 2017-09-05 NOTE — Progress Notes (Signed)
Occupational Therapy Session Note  Patient Details  Name: Cletus GashJao Shaff MRN: [id number] Date of Birth: 03/08/1976  Today's Date: 09/05/2017 OT Individual Time: 1113-1203 OT Individual Time Calculation (min): 50 min    Short Term Goals: Week 4:  OT Short Term Goal 1 (Week 4): STG=LTG due to LOS  Skilled Therapeutic Interventions/Progress Updates:    Pt seen for OT session focusing on functional transfers and standing balance. Pt sitting up in w/c upon arrival with mom present, agreeable to tx session. Pt taken to therapy gym total A in w/c for time and energy conservation.  Completed min A squat pivot transfers throughout session with VCs for technique and initiation.  Pt completed dynamic standing task at RW working on pt letting go of RW with L hand in order to complete functional task. Pt completed x3 Myiesha Edgar of standing to remove clothes pins from around pants waist band in prep for LB dressing task. He was able to maintain dynamic standing balance with overall min A- occasional mod A when crossing midline to remove clothes pins. Min cuing for re-orientation to midline due to L lean. Pt returned to room and in room completed x3 pulling pants up/down while standing at RW, able to complete with min steadying assist initially, progressing to mod A with decreased attention and fatigue.  He then practiced shooting "baskets" throwing paper towels into trash can from standing position as this is activity he did with his son. Pt able to make 4/5 baskets while maintaining dynamic standing with min A.  Pt left seated in w/c at end of session, all needs in reach, R UE supported and mom present.  Education provided throughout session regarding activity progression, importance of participation and independence with ADLs and d/c planning.   Therapy Documentation Precautions:  Precautions Precautions: Fall Precaution Comments: hemicraniectomy placement of bone flap abdominal pocket Required Braces or  Orthoses: Other Brace/Splint Other Brace/Splint: Helmet when OOB Restrictions Weight Bearing Restrictions: No Pain:   No/ denies pain  See Function Navigator for Current Functional Status.   Therapy/Group: Individual Therapy  Nolah Krenzer L 09/05/2017, 7:14 AM

## 2017-09-05 NOTE — Progress Notes (Signed)
Occupational Therapy Session Note  Patient Details  Name: Jeremy [name] MRN: [id number] Date of Birth: Nov 26, 1975  Today's Date: 09/05/2017 OT Individual Time: [id number] OT Individual Time Calculation (min): 37 min    Skilled Therapeutic Interventions/Progress Updates:    Upon entering the room, pt seated in wheelchair with mother present for session. [facility] for time management. Pt standing from wheelchair with min A and using L UE to place R UE into walker orthosis. Pt standing for dynavision task for 4 minutes and able to hit 105 targets with reaction time of 2.3 seconds. Pt having reaction time on L side of board with average of 1.5 seconds and 3.0 seconds on R side. Pt able to scan to R with increased time and no cues given for task. Pt requiring min A for standing balance when crossing midline and reaching to R side. Pt taking seated rest break. Pt ambulating 13' with RW and max multimodal cues and mod A for advancement of R LE. Pt returning to wheelchair and mother assisted back to room. Call bell and all needed items within reach. Quick release belt and lap tray donned for safety.   Therapy Documentation Precautions:  Precautions Precautions: Fall Precaution Comments: hemicraniectomy placement of bone flap abdominal pocket Required Braces or Orthoses: Other Brace/Splint Other Brace/Splint: Helmet when OOB Restrictions Weight Bearing Restrictions: No  See Function Navigator for Current Functional Status.   Therapy/Group: Individual Therapy  Alen BleacherBradsher, Josemaria Brining P 09/05/2017, 3:34 PM

## 2017-09-06 ENCOUNTER — Inpatient Hospital Stay (HOSPITAL_COMMUNITY): Payer: BLUE CROSS/BLUE SHIELD | Admitting: Occupational Therapy

## 2017-09-06 ENCOUNTER — Inpatient Hospital Stay (HOSPITAL_COMMUNITY): Payer: BLUE CROSS/BLUE SHIELD

## 2017-09-06 ENCOUNTER — Inpatient Hospital Stay (HOSPITAL_COMMUNITY): Payer: BLUE CROSS/BLUE SHIELD | Admitting: Physical Therapy

## 2017-09-06 ENCOUNTER — Inpatient Hospital Stay (HOSPITAL_COMMUNITY): Payer: BLUE CROSS/BLUE SHIELD | Admitting: Speech Pathology

## 2017-09-06 MED ORDER — TRAMADOL HCL 50 MG PO TABS
50.0000 mg | ORAL_TABLET | Freq: Once | ORAL | Status: AC
  Administered 2017-09-06: 50 mg via ORAL
  Filled 2017-09-06: qty 1

## 2017-09-06 NOTE — Progress Notes (Signed)
Occupational Therapy Discharge Summary  Patient Details  Name: Jeremy [name] MRN: [id number] Date of Birth: 08-02-1975   Patient has met 54 of 78 long term goals due to improved activity tolerance, improved balance, postural control, ability to compensate for deficits, functional use of  RIGHT upper and RIGHT lower extremity, improved attention, improved awareness and improved coordination.  Patient to discharge at overall Mod Assist level.  Patient's care partner is independent to provide the necessary physical and cognitive assistance at discharge.  Pt's mother has been present throughout rehab admission and has gone through extensive education and hands on training with pt. She demonstrates willing and ableness to provide the needed physical and cognitive assist pt requires at d/c.  Pt is completing functional transfers with min-mod A via stand or squat pivot. He is able to maintain static standing balance with UE support with supervision while caregiver provides needed assist during LB dressing/ toileting needs. Have also begun having pt standing with RW, R UE in hand splint and pt complete clothing management. He requires mod A for dynamic standing balance during ADL tasks.    Recommendation:  Patient will benefit from ongoing skilled OT services in home health setting to continue to advance functional skills in the area of BADL and Reduce care partner burden.  Equipment: Drop arm BSC, pt's family privately purchasing tub transfer bench  Reasons for discharge: treatment goals met and discharge from hospital  Patient/family agrees with progress made and goals achieved: Yes  OT Discharge Precautions/Restrictions  Precautions Precautions: Fall Precaution Comments: hemicraniectomy placement of bone flap abdominal pocket Required Braces or Orthoses: Other Brace/Splint Other Brace/Splint: Helmet when OOB Restrictions Weight Bearing Restrictions: No Vision Baseline Vision/History: Wears  glasses Wears Glasses: At all times Patient Visual Report: Blurring of vision;Peripheral vision impairment Vision Assessment?: Yes;Vision impaired- to be further tested in functional context Eye Alignment: Impaired (comment)(L gaze preference) Ocular Range of Motion: Within Functional Limits Alignment/Gaze Preference: Head turned Tracking/Visual Pursuits: Decreased smoothness of vertical tracking Visual Fields: Right visual field deficit;Right homonymous hemianopsia;Impaired-to be further tested in functional context Additional Comments: Pt with difficulty following directions for formal testing. He also prefers not to wear his glasses despite visual deficits PTA, so difficult to assess vision impacted soley by CVA Perception  Perception: Impaired Inattention/Neglect: Does not attend to right side of body;Does not attend to right visual field Praxis Praxis: Impaired Praxis Impairment Details: Initiation;Motor planning;Perseveration Cognition Overall Cognitive Status: Impaired/Different from baseline Arousal/Alertness: Awake/alert Orientation Level: Oriented X4 Attention: Alternating Alternating Attention: Impaired Alternating Attention Impairment: Verbal basic;Functional basic Memory: Impaired Memory Impairment: Decreased recall of new information;Decreased short term memory Decreased Short Term Memory: Verbal complex;Functional complex Awareness: Impaired Awareness Impairment: Anticipatory impairment Problem Solving: Impaired Problem Solving Impairment: Verbal complex;Functional complex Executive Function: Self Correcting;Organizing;Decision Making;Initiating Reasoning: Impaired Reasoning Impairment: Verbal complex;Functional complex Organizing: Impaired Organizing Impairment: Verbal complex;Functional complex Decision Making: Impaired Decision Making Impairment: Verbal complex;Functional complex Initiating: Impaired Initiating Impairment: Verbal complex;Functional  complex Self Correcting: Impaired Self Correcting Impairment: Verbal complex;Functional complex Behaviors: Impulsive Safety/Judgment: Impaired Comments: left gaze preference, R neglect, mildly impulsive Sensation Sensation Light Touch: Impaired by gross assessment Light Touch Impaired Details: Absent RLE;Absent RUE Proprioception: Impaired Detail Proprioception Impaired Details: Absent RUE Coordination Gross Motor Movements are Fluid and Coordinated: No Fine Motor Movements are Fluid and Coordinated: No Coordination and Movement Description: Flacid RUE with trace movement in gravity eliminated positions. trace acitvation of the RLE Finger Nose Finger Test: WFL L UE, unable to assess R UE due to  flaccid hemiplegia Motor  Motor Motor: Hemiplegia;Abnormal tone;Ataxia Motor - Discharge Observations: Dense R hemiplegia UE>LE Mobility  Bed Mobility Bed Mobility: Rolling Left;Rolling Right;Supine to Sit;Sit to Supine Rolling Right: 4: Min assist Rolling Left: 4: Min assist Supine to Sit: 4: Min assist Sit to Supine: 4: Min assist Transfers Sit to Stand: 4: Min assist  Trunk/Postural Assessment  Cervical Assessment Cervical Assessment: Exceptions to WFL(L gaze preference) Thoracic Assessment Thoracic Assessment: Within Functional Limits Lumbar Assessment Lumbar Assessment: Exceptions to WFL(Posterior pelvic tilt posterior Rotated on the R) Postural Control Postural Control: Deficits on evaluation(delayed righting reactions. able to correct with moderate cues in sitting and standing. )  Balance Balance Balance Assessed: Yes Dynamic Sitting Balance Dynamic Sitting - Balance Support: During functional activity;Feet supported Dynamic Sitting - Level of Assistance: 5: Stand by assistance Sitting balance - Comments: Sitting EOB Static Standing Balance Static Standing - Balance Support: During functional activity;Right upper extremity supported Static Standing - Level of Assistance:  5: Stand by assistance Static Standing - Comment/# of Minutes: Standing at RW Dynamic Standing Balance Dynamic Standing - Balance Support: During functional activity;Right upper extremity supported Dynamic Standing - Level of Assistance: 3: Mod assist Extremity/Trunk Assessment RUE Assessment RUE Assessment: (Trace movements in gravity eliminated position, absent wrist/hand ) LUE Assessment LUE Assessment: Within Functional Limits   See Function Navigator for Current Functional Status.  Rounds, Amy L 09/06/2017, 2:54 PM

## 2017-09-06 NOTE — Discharge Summary (Signed)
Discharge summary job [id number]

## 2017-09-06 NOTE — Discharge Summary (Signed)
NAMEALBARO, DEVINEY NO.:  [id number]  MEDICAL RECORD NO.:  [id number]  LOCATION:  [id number]                        FACILITY:  [facility]  PHYSICIAN:  Erick Colace, M.D.DATE OF BIRTH:  Dec 21, 1975  DATE OF ADMISSION:  08/07/2017 DATE OF DISCHARGE:  09/07/2017                              DISCHARGE SUMMARY   DISCHARGE DIAGNOSES: 1. Left middle cerebral artery infarction with M1 occlusion, status     post mechanical thrombectomy. 2. Subcutaneous Lovenox for deep vein thrombosis prophylaxis. 3. Pain management. 4. Dysphagia. 5. Hypertension. 6. Hyperlipidemia. 7. Elevated transaminase. 8. Bladder incontinence. 9. Acute blood loss anemia.  This is a 42 year old right-handed male with history of hypertension, on no antihypertensive medications, who presented and July 27, 2017, with right-sided weakness, headache, and slurred speech.  He lives with spouse and 55-year-old son, independent prior to admission.  Cranial CT scan showed left insula and frontal operculum lucency, compatible with infarction.  Echocardiogram with ejection fraction of 65%, no wall motion abnormalities, no PFO.  CT angiography of the head and neck showed a left M1 occlusion, intermediate left MCA distribution collateralization.  Underwent attempted mechanical thrombectomy per Interventional Radiology that was unsuccessful, required intubation. Followup MRI showed cerebellar edema, midline shift; placed on 3% saline.  Underwent left decompressive hemicraniectomy, placement of bone flap in abdominal pocket on July 29, 2017, per Dr. [name].  The patient extubated on June 02, 2018.  Diet slowly advanced.  Followup cranial CT scan on August 03, 2017, no acute changes.  Bilateral lower extremity Dopplers negative.  TEE was attempted on August 06, 2017, but one was unable to performed.  There was no plan for a loop recorder. Subcutaneous Lovenox for DVT prophylaxis.  Physical and  occupational therapy ongoing.  The patient was admitted for comprehensive rehab program.  PAST MEDICAL HISTORY:  See discharge diagnoses.  SOCIAL HISTORY:  Lives with wife, independent prior to admission.  FUNCTIONAL STATUS UPON ADMISSION TO REHAB SERVICES:  Moderate assist squat pivot transfers, max assist stand pivot transfers, max to total assist activities daily living.  PHYSICAL EXAMINATION:  VITAL SIGNS:  Blood pressure 146/99, pulse 88, temperature 97, and respirations 18. GENERAL:  Alert male. HEENT:  Craniectomy site flap clean and dry.  Staples intact.  The patient with left gaze preference. CARDIAC:  Rate controlled. ABDOMEN:  Soft, nontender.  Good bowel sounds. LUNGS:  Clear to auscultation without wheeze. NEUROLOGICAL:  Motor strength is 0/5 proximal to distal on both right arm and right leg.  REHABILITATION HOSPITAL COURSE:  The patient was admitted to Inpatient Rehab Services.  Therapies initiated on a 3-hour daily basis consisting of physical therapy, occupational therapy, speech therapy, and rehabilitation nursing.  Following issues were addressed during the patient's rehabilitation stay.  Pertaining to Mr. Fahmy's large left MCA infarction, attempted thrombectomy unsuccessful, underwent decompressive hemicraniectomy, placement of bone flap in abdominal pocket on July 29, 2017.  The patient would follow up with Neurosurgery, Dr. [name] in regard to flap repair.  The patient on subcutaneous Lovenox for DVT prophylaxis, venous Doppler studies negative.  Pain management with the use of Topamax for headaches.  Mood, with initiation of Ritalin to help  the patient focus to task.  Diet was slowly advanced to regular.  Blood pressure is currently controlled, on no antihypertensive medications. Lipitor for hyperlipidemia.  Mildly elevated transaminase, likely related to Lipitor and monitored.  He had some nighttime bladder incontinence, latest urine study Staph  epi, felt likely contaminant.  He did complete an empiric course of Keflex.  He remained afebrile.  White blood cell count within normal limits.  The patient received weekly collaborative interdisciplinary team conferences to discuss estimated length of stay, family teaching, any barriers to discharge.  Sessions focused on functional transfers and mobility.  The patient propelled to the orthopedic gym, demonstrating improved negotiation of obstacles on the left side.  Required reduced reinforcement through sessions.  The patient, mother performed car transfers, stand pivot, good safety, propel to the ADL apartment, performing stand pivot and mom providing max to total assist for bed mobility.  Provided full family education. Activities of daily living and homemaking total assist and wheelchair for time and energy conservation.  Completed minimal assist squat pivot transfers throughout sessions.  Completed dynamic standing using a rolling walker.  Speech Therapy followup.  His diet was advanced to regular.  The patient understands complex comprehension 90% of the time. Needing some cues.  He could express his simple needs.  It was advised the need for 24-hour supervision.  Full family teaching was completed and discharged to home.  DISCHARGE MEDICATIONS: 1. Aspirin 325 mg p.o. daily. 2. Lipitor 40 mg p.o. daily. 3. Lexapro 10 mg p.o. daily. 4. Zetia 10 mg p.o. daily. 5. Ritalin 5 mg p.o. b.i.d. 6. Protonix 40 mg p.o. daily. 7. MiraLAX daily. 8. Topamax 25 mg p.o. b.i.d.  DIET:  His diet was regular.  FOLLOWUP:  The patient would follow up with Dr[facility] Claudette LawsAndrew [facility]ins [facility]; Dr. Pearlean BrownieSethi, Neurology Services, call for appointment; Dr. Conchita ParisNundkumar to schedule plans for possible cranioplasty; Dr. Jerl MinaJames Hedrick, Medical Management.  SPECIAL INSTRUCTIONS:  Protective helmet for safety.     Mariam Dollaraniel Angiulli,  P.A.   ______________________________ Erick ColaceAndrew E. Kirsteins, M.D.    DA/MEDQ  D:  09/06/2017  T:  09/06/2017  Job:  [id number]  cc:   Jerl MinaJames Hedrick, MD Dr. Evelena PeatNundkumar Andrew E. Kirsteins, M.D. Pramod P. Pearlean BrownieSethi, MD

## 2017-09-06 NOTE — Progress Notes (Signed)
Physical Therapy Session Note  Patient Details  Name: Jeremy [name] MRN: [id number] Date of Birth: 03/10/1976  Today's Date: 09/06/2017 PT Individual Time: 1130-1203 PT Individual Time Calculation (min): 33 min   Short Term Goals: Week 4:  PT Short Term Goal 1 (Week 4): STG=LTG due to ELOS  Skilled Therapeutic Interventions/Progress Updates:    Focused on w/c mobility on unit and in ADL apartment to simulate home environment with overall supervision and min verbal cues for attention to obstacles on R in tight spaces but significantly improved. Cues for encouragement and to increase speed. Family education for bed <> w/c and w/c <> couch transfers and bed mobility on flat surface with emphasis on encouragement for family to allow pt to initiate and do more of movement before intervening (only really requires min assist but pt's mother often providing up to mod assist). Demonstrates good body mechanics and safe set up and positioning of w/c by pt's mother.   Therapy Documentation Precautions:  Precautions Precautions: Fall Precaution Comments: hemicraniectomy placement of bone flap abdominal pocket Required Braces or Orthoses: Other Brace/Splint Other Brace/Splint: Helmet when OOB Restrictions Weight Bearing Restrictions: No   Pain: No reports of pain.  See Function Navigator for Current Functional Status.   Therapy/Group: Individual Therapy  Karolee StampsGray, Taygen Newsome Darrol PokeBrescia  Saralynn Langhorst B. Dennisha Mouser, PT, DPT  09/06/2017, 12:09 PM

## 2017-09-06 NOTE — Progress Notes (Signed)
Occupational Therapy Session Note  Patient Details  Name: Jeremy [name] MRN: [id number] Date of Birth: Mar 04, 1976  Today's Date: 09/06/2017 OT Individual Time: 1000-1100 OT Individual Time Calculation (min): 60 min    Short Term Goals: Week 4:  OT Short Term Goal 1 (Week 4): STG=LTG due to LOS  Skilled Therapeutic Interventions/Progress Updates:    Pt seen for OT ADL bathing/dressing session and  Continue with family education. Pt received with hand off from SLP, agreeable to tx session. Pt's mother present for session. Shower task completed in ADL apartment utilizing tub/shower combination and tub transfer bench. Pt completed squat pivot transfers throughout session. Pt's mother provided assist for all transfers. He bathed seated on tub transfer bench. He demonstrated ability to cross ankle over knee in order to wash L foot and bend over to wash R foot. Lateral leans completed for buttock hygiene and min steadying assist provided throughout for dynamic sitting balance. Pt dressed seated on edge of tub bench. He required increased time, encouragement and simple cuing, but able to thread L LE into underwear/ pants and with assist to obtain and maintain figure four sitting position with R LE, pt able to thread pants and sock on R foot. [facility] and with mod steadying assist able to pull pants up requiring VCs for attention to R side of pants. Pt returned to w/c and left with wife and mother to assist him back to room in w/c.    Therapy Documentation Precautions:  Precautions Precautions: Fall Precaution Comments: hemicraniectomy placement of bone flap abdominal pocket Required Braces or Orthoses: Other Brace/Splint Other Brace/Splint: Helmet when OOB Restrictions Weight Bearing Restrictions: No Pain:   No/denies pain  See Function Navigator for Current Functional Status.   Therapy/Group: Individual Therapy  [name] L 09/06/2017, 6:41 AM

## 2017-09-06 NOTE — Progress Notes (Signed)
Physical Therapy Discharge Summary  Patient Details  Name: Jeremy [name] MRN: [id number] Date of Birth: [birth year]  Today's Date: 09/06/2017 PT Individual Time: 1400-1500 PT Individual Time Calculation (min): 60 min     Patient has met 7 of 8 long term goals due to improved activity tolerance, improved balance, improved postural control, increased strength, increased range of motion, decreased pain, ability to compensate for deficits, functional use of  right lower extremity, improved attention, improved awareness and improved coordination.  Patient to discharge at a wheelchair level [place].   Patient's care partner is independent to provide the necessary physical assistance at discharge.  Reasons goals not met: Pt unable to functionally ambulate in house with family   Recommendation:  Patient will benefit from ongoing skilled PT services in home health setting to continue to advance safe functional mobility, address ongoing impairments in balance, attention, strength, balance, gait, safety, and minimize fall risk.  Equipment: RW, WC.   Reasons for discharge: treatment goals met and discharge from hospital  Patient/family agrees with progress made and goals achieved: Yes   PT treatment.  PT instructed pt in Grad day assessment to measure progress toward goals. See below for details. Patient returned to room and left sitting in [facility] with call bell in reach and all needs met.    PT Discharge Precautions/Restrictions Precautions Precautions: Fall Precaution Comments: hemicraniectomy placement of bone flap abdominal pocket Required Braces or Orthoses: Other Brace/Splint Other Brace/Splint: Helmet when OOB Restrictions Weight Bearing Restrictions: No    Pain Pain Assessment Pain Assessment: 0-10 Pain Score: 4  Pain Type: Surgical pain Pain Location: Abdomen Pain Orientation: Right Pain Intervention(s): Medication (See eMAR) Vision/Perception  Vision - Assessment Eye Alignment:  Impaired (comment)(L gaze preference) Ocular Range of Motion: Within Functional Limits Alignment/Gaze Preference: Head turned Tracking/Visual Pursuits: Decreased smoothness of vertical tracking Additional Comments: Pt with difficulty following directions for formal testing. He also prefers not to wear his glasses despite visual deficits PTA, so difficult to assess vision impacted soley by CVA Perception Perception: Impaired Inattention/Neglect: Does not attend to right side of body;Does not attend to right visual field Praxis Praxis: Impaired Praxis Impairment Details: Initiation;Motor planning;Perseveration  Cognition Overall Cognitive Status: Impaired/Different from baseline Arousal/Alertness: Awake/alert Orientation Level: Oriented X4 Attention: Alternating Alternating Attention: Impaired Alternating Attention Impairment: Verbal basic;Functional basic Memory: Impaired Memory Impairment: Decreased recall of new information;Decreased short term memory Decreased Short Term Memory: Verbal complex;Functional complex Awareness: Impaired Awareness Impairment: Anticipatory impairment Problem Solving: Impaired Problem Solving Impairment: Verbal complex;Functional complex Executive Function: Self Correcting;Organizing;Decision Making;Initiating Reasoning: Impaired Reasoning Impairment: Verbal complex;Functional complex Organizing: Impaired Organizing Impairment: Verbal complex;Functional complex Decision Making: Impaired Decision Making Impairment: Verbal complex;Functional complex Initiating: Impaired Initiating Impairment: Verbal complex;Functional complex Self Correcting: Impaired Self Correcting Impairment: Verbal complex;Functional complex Behaviors: Impulsive Safety/Judgment: Impaired Comments: left gaze preference, R neglect, mildly impulsive Sensation Sensation Light Touch: Impaired by gross assessment Light Touch Impaired Details: Absent RLE;Absent RUE Proprioception:  Impaired Detail Proprioception Impaired Details: Absent RUE Coordination Gross Motor Movements are Fluid and Coordinated: No Fine Motor Movements are Fluid and Coordinated: No Coordination and Movement Description: Flacid RUE with trace movement in gravity eliminated positions. trace acitvation of the RLE Finger Nose Finger Test: WFL L UE, unable to assess R UE due to flaccid hemiplegia Motor  Motor Motor: Hemiplegia;Abnormal tone;Ataxia Motor - Discharge Observations: Dense R hemiplegia UE>LE  Mobility Bed Mobility Bed Mobility: Rolling Left;Rolling Right;Supine to Sit;Sit to Supine Rolling Right: 4: Min assist Rolling Left: 4: Min assist Supine  to Sit: 4: Min assist Sit to Supine: 4: Min assist Transfers Transfers: Yes Sit to Stand: 4: Min assist Squat Pivot Transfers: 4: Min assist Locomotion  Ambulation Ambulation: Yes Ambulation/Gait Assistance: 3: Mod assist;2: Max assist Ambulation Distance (Feet): 30 Feet Assistive device: Rolling walker;Other (Comment)(AFO) Gait Gait: Yes Gait Pattern: Impaired Gait Pattern: Step-to pattern;Decreased step length - right;Decreased hip/knee flexion - right;Right circumduction Stairs / Additional Locomotion Stairs: Yes Stairs Assistance: 2: Max assist Stair Management Technique: One rail Left Number of Stairs: 4 Height of Stairs: 3 Wheelchair Mobility Wheelchair Mobility: Yes Wheelchair Assistance: 5: Supervision Wheelchair Propulsion: Left lower extremity;Left upper extremity Wheelchair Parts Management: Supervision/cueing  Trunk/Postural Assessment  Cervical Assessment Cervical Assessment: Exceptions to WFL(L gaze preference) Thoracic Assessment Thoracic Assessment: Within Functional Limits Lumbar Assessment Lumbar Assessment: Exceptions to WFL(Posterior pelvic tilt posterior Rotated on the R) Postural Control Postural Control: Deficits on evaluation(delayed righting reactions. able to correct with moderate cues in sitting  and standing. )  Balance Balance Balance Assessed: Yes Dynamic Sitting Balance Dynamic Sitting - Balance Support: During functional activity;Feet supported Dynamic Sitting - Level of Assistance: 5: Stand by assistance Sitting balance - Comments: Sitting EOB Static Standing Balance Static Standing - Balance Support: During functional activity;Right upper extremity supported Static Standing - Level of Assistance: 5: Stand by assistance Static Standing - Comment/# of Minutes: Standing at RW Dynamic Standing Balance Dynamic Standing - Balance Support: During functional activity;Right upper extremity supported Dynamic Standing - Level of Assistance: 3: Mod assist Extremity Assessment      RLE Assessment RLE Assessment: Exceptions to [facility] RLE Strength RLE Overall Strength Comments: hip and knee extension at least 3/5 with functional funcitonal movement, unable to formally test due to apraxia.  LLE Assessment LLE Assessment: Within Functional Limits   See Function Navigator for Current Functional Status.  [name] 09/06/2017, 2:54 PM

## 2017-09-06 NOTE — Progress Notes (Signed)
Physical Therapy Session Note  Patient Details  Name: Jeremy [name] MRN: [id number] Date of Birth: April 17, 1976  Today's Date: 09/05/2017 PT Individual Time: 1600-1700   60 min   Short Term Goals: Week 4:  PT Short Term Goal 1 (Week 4): STG=LTG due to ELOS  Skilled Therapeutic Interventions/Progress Updates:   Pt received sitting in [facility] and agreeable to PT Pt's family present for education with car transfer. Car transfer performed to pt's sedan x 2. Once with assist from PT and once with assist from pt's mother. Mod assist overall for each transfers with cues from PT to family for proper set up as well as how to effectively cue pt. Pt transfers back to rehab unit in [facility]. Gait training with mod assist x 60f with max cues for gait pattern, weight shifting, and AD management. Pt noted to have increased RLE hip flexion and foot clearance with practice as well as cues for step to gait pattern to minimize toe drag. Pt sat EOM following gait training and PT educated pt and family on rehab potential following d/c as well as progress tracking/gola setting to encourage continued functional gains. WC mobility though simulated home environment with supervision assist x 542fand moderate cues for attention to R side. [facility] and all needs met.        Therapy Documentation Precautions:  Precautions Precautions: Fall Precaution Comments: hemicraniectomy placement of bone flap abdominal pocket Required Braces or Orthoses: Other Brace/Splint Other Brace/Splint: Helmet when OOB Restrictions Weight Bearing Restrictions: No Vital Signs: Therapy Vitals Temp: 98.4 F (36.9 C) Temp Source: Oral Pulse Rate: 75 Resp: 16 BP: 132/77 Patient Position (if appropriate): Lying Oxygen Therapy SpO2: 98 % O2 Device: Not Delivered Pain: denies  See Function Navigator for Current Functional Status.   Therapy/Group: Individual Therapy  AuLorie Phenix/21/2019, 6:11 AM
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
## 2017-09-06 NOTE — Progress Notes (Signed)
Speech Language Pathology Discharge Summary  Patient Details  Name: Jeremy [name] MRN: [id number] Date of Birth: 05/09/1976  Today's Date: 09/06/2017 SLP Individual Time: 0915-1000 SLP Individual Time Calculation (min): 45 min   Skilled Therapeutic Interventions:  Skilled treatment session focused on cognition goals. Pt's mother present. SLP facilitated session by providing Min A cues to initiate task within complex card game. Pt with off topic comments regarding treatments being provided in separate area, indicating that pt with decreased ability to filter out distractions. Pt required Mod A cues to scan to right despite players waiting on him to locate cards. Pt with no insight that card might potentially be on his right. Pt's functional cognitive ability continues to influenced by behavioral choices such as stating "I don't know or maybe" when it is obvious that he knows the answer (indicated by grin). Education provided to pt on projecting better cognitive abilities but monitoring behavioral responses.     Patient has met 8 of 8 long term goals.  Patient to discharge at overall Modified Independent;Supervision level.    Clinical Impression/Discharge Summary:   Pt has made good progress towards all ST LTGs. As a result, pt has met all of his LTGs. Pt's cognitive abilities are functional within more structured tasks but within unstructured tasks, pt with decreased attention and inability to monitor personality traits that interfere with complete engagement in tasks. Uncertain if follow up ST services will be beneficial.    Care Partner:  Caregiver Able to Provide Assistance: Yes  Type of Caregiver Assistance: Cognitive  Recommendation:  Home Health SLP;Outpatient SLP;24 hour supervision/assistance  Rationale for SLP Follow Up: Maximize cognitive function and independence;Reduce caregiver burden   Equipment:     Reasons for discharge: Treatment goals met;Discharged from hospital    Patient/Family Agrees with Progress Made and Goals Achieved: Yes   Function:  Eating Eating   Modified Consistency Diet: No Eating Assist Level: Set up assist for   Eating Set Up Assist For: Opening containers       Cognition Comprehension Comprehension assist level: Understands complex 90% of the time/cues 10% of the time;Follows complex conversation/direction with extra time/assistive device  Expression   Expression assist level: Expresses complex 90% of the time/cues < 10% of the time  Social Interaction Social Interaction assist level: Interacts appropriately 90% of the time - Needs monitoring or encouragement for participation or interaction.  Problem Solving Problem solving assist level: Solves complex 90% of the time/cues < 10% of the time;Solves complex problems: With extra time  Memory Memory assist level: Recognizes or recalls 90% of the time/requires cueing < 10% of the time   Heleena Miceli 09/06/2017, 11:06 AM

## 2017-09-07 MED ORDER — ATORVASTATIN CALCIUM 40 MG PO TABS: 40.0000 mg | ORAL_TABLET | Freq: Every day | ORAL | 0 refills | Status: DC

## 2017-09-07 MED ORDER — ESCITALOPRAM OXALATE 10 MG PO TABS: 10.0000 mg | ORAL_TABLET | Freq: Every day | ORAL | 0 refills | Status: DC

## 2017-09-07 MED ORDER — POLYETHYLENE GLYCOL 3350 17 G PO PACK: 17.0000 g | PACK | Freq: Every day | ORAL | 0 refills | Status: DC

## 2017-09-07 MED ORDER — EZETIMIBE 10 MG PO TABS: 10.0000 mg | ORAL_TABLET | Freq: Every day | ORAL | 0 refills | Status: DC

## 2017-09-07 MED ORDER — PANTOPRAZOLE SODIUM 40 MG PO TBEC: 40.0000 mg | DELAYED_RELEASE_TABLET | Freq: Every day | ORAL | 0 refills | Status: DC

## 2017-09-07 MED ORDER — TOPIRAMATE 25 MG PO TABS: 25.0000 mg | ORAL_TABLET | Freq: Two times a day (BID) | ORAL | 0 refills | Status: DC

## 2017-09-07 MED ORDER — ASPIRIN 325 MG PO TABS: 325.0000 mg | ORAL_TABLET | Freq: Every day | ORAL | Status: DC

## 2017-09-07 MED ORDER — METHYLPHENIDATE HCL 5 MG PO TABS: 5.0000 mg | ORAL_TABLET | Freq: Two times a day (BID) | ORAL | 0 refills | Status: DC

## 2017-09-07 NOTE — Progress Notes (Signed)
Pt d/c home with family and belongings. Discharge instructions provided to pt and family by Deatra Inaan Angiulli Pac, Questions and concerns answered.  No other concerns at this time.

## 2017-09-07 NOTE — Discharge Instructions (Signed)
Inpatient Rehab Discharge Instructions  Jeremy [name] Discharge date and time: No discharge date for patient encounter.   Activities/Precautions/ Functional Status: Activity: activity as tolerated Diet: regular diet Wound Care: keep wound clean and dry Functional status:  ___ No restrictions     ___ Walk up steps independently ___ 24/7 supervision/assistance   ___ Walk up steps with assistance ___ Intermittent supervision/assistance  ___ Bathe/dress independently ___ Walk with walker     __x_ Bathe/dress with assistance ___ Walk Independently    ___ Shower independently ___ Walk with assistance    ___ Shower with assistance ___ No alcohol     ___ Return to work/school ________  Special Instructions:  Continue protective helmet   Follow-up with PCP for management of hypertension.    COMMUNITY REFERRALS UPON DISCHARGE:    Home Health:   PT, OT, SP, AIDE  Agency:[facility]     Phone:[phone]   Date of last service:09/07/2017  Medical Equipment/Items Ordered:WHEELCHAIR, DROP-ARM BEDSIDE COMMODE, ROLLING WALKER & TUB BENCH  Agency/Supplier:[facility]   [phone]  Other:PRIVATE DUTY AGENCIES, TRANSPORTATION SERVICES, APPLY FOR SOCIAL SECURITY DISABILITY  GENERAL COMMUNITY RESOURCES FOR PATIENT/FAMILY: Support Groups:[facility] SUPPORT GROUP [facility] Tuesday [facility] MONTH (MARCH-December) @ 12:00-1:30 PM  QUESTIONS CONTACT [phone] [facility]  NEUROPSYCHOLOGIST-DR Arley Phenix [phone]   STROKE/TIA DISCHARGE INSTRUCTIONS SMOKING Cigarette smoking nearly doubles your risk of having a stroke & is the single most alterable risk factor  If you smoke or have smoked in the last 12 months, you are advised to quit smoking for your health.  Most of the excess cardiovascular risk related to smoking disappears within a year of stopping.  Ask you doctor about anti-smoking medications  [facility] Quit Line: 1-800-QUIT NOW  Free Smoking Cessation Classes [phone]  CHOLESTEROL  Know your levels; limit fat & cholesterol in your diet  Lipid Panel     Component Value Date/Time   CHOL 218 (H) 07/28/2017 0259   TRIG 316 (H) 07/30/2017 2323   HDL 28 (L) 07/28/2017 0259   CHOLHDL 7.8 07/28/2017 0259   VLDL UNABLE TO CALCULATE IF TRIGLYCERIDE OVER 400 mg/dL 28/41/3244 0102   LDLCALC UNABLE TO CALCULATE IF TRIGLYCERIDE OVER 400 mg/dL 72/53/6644 0347      Many patients benefit from treatment even if their cholesterol is at goal.  Goal: Total Cholesterol (CHOL) less than 160  Goal:  Triglycerides (TRIG) less than 150  Goal:  HDL greater than 40  Goal:  LDL (LDLCALC) less than 100   BLOOD PRESSURE American Stroke Association blood pressure target is less that 120/80 mm/Hg  Your discharge blood pressure is:  BP: 129/88  Monitor your blood pressure  Limit your salt and alcohol intake  Many individuals will require more than one medication for high blood pressure  DIABETES (A1c is a blood sugar average for last 3 months) Goal HGBA1c is under 7% (HBGA1c is blood sugar average for last 3 months)  Diabetes: No known diagnosis of diabetes    Lab Results  Component Value Date   HGBA1C 5.2 07/28/2017     Your HGBA1c can be lowered with medications, healthy diet, and exercise.  Check your blood sugar as directed by your physician  Call your physician if you experience unexplained or low blood sugars.  PHYSICAL ACTIVITY/REHABILITATION Goal is 30 minutes at least 4 days per week  Activity: Increase activity slowly, Therapies: Physical Therapy: Home Health Return to work:   Activity decreases your risk of heart attack and stroke and makes your heart stronger.  It helps control your weight and blood pressure; helps you relax and can improve your mood.  Participate in a regular exercise program.  Talk with your doctor about the best form of exercise for you (dancing, walking, swimming, cycling).  DIET/WEIGHT Goal is to maintain a healthy weight  Your discharge  diet is: Fall precautions Diet regular Room service appropriate? Yes; Fluid consistency: Thin  liquids Your height is:  Height: 5\' 10"  (177.8 cm) Your current weight is: Weight: 85 kg (187 lb 6.3 oz) Your Body Mass Index (BMI) is:  BMI (Calculated): 26.73  Following the type of diet specifically designed for you will help prevent another stroke.  Your goal weight range is:    Your goal Body Mass Index (BMI) is 19-24.  Healthy food habits can help reduce 3 risk factors for stroke:  High cholesterol, hypertension, and excess weight.  RESOURCES Stroke/Support Group:  Call [phone]   STROKE EDUCATION PROVIDED/REVIEWED AND GIVEN TO PATIENT Stroke warning signs and symptoms How to activate emergency medical system (call 911). Medications prescribed at discharge. Need for follow-up after discharge. Personal risk factors for stroke. Pneumonia vaccine given:  Flu vaccine given:  My questions have been answered, the writing is legible, and I understand these instructions.  I will adhere to these goals & educational materials that have been provided to me after my discharge from the hospital.     My questions have been answered and I understand these instructions. I will adhere to these goals and the provided educational materials after my discharge from the hospital.  Patient/Caregiver Signature _______________________________ Date __________  Clinician Signature _______________________________________ Date __________  Please bring this form and your medication list with you to all your follow-up doctor's appointments.

## 2017-09-07 NOTE — Progress Notes (Signed)
Subjective/Complaints: no dysuria, has had intermittent incontinence of urine.  No fevers No headache or shoulder pain  ROS: pt denies nausea, vomiting, diarrhea, cough, shortness of breath or chest pain    Objective: Vital Signs: Blood pressure (!) 134/93, pulse 71, temperature 99 F (37.2 C), temperature source Oral, resp. rate 17, height 5\' 10"  (1.778 m), weight 85 kg (187 lb 6.3 oz), SpO2 100 %. No results found. No results found for this or any previous visit (from the past 72 hour(s)).   HEENT: scalp incision CDI, mild ptosis left eye Cardio: RRR without murmur. No JVD    Resp: CTA Bilaterally without wheezes or rales. Normal effort   GI: BS positive and abd pocket for bone flap incision CDI, no abd denterness Musculoskeletal:  No Edema. No tenderness Skin:   Wound C/D/I and abd and scalp Neuro: Alert/Oriented,  Motor: Right upper extremity/right lower extremity: 0/5 proximal to distal (no change) Sensation absent to light touch right upper extremity/right lower extremity Tone:  Increased R pec, wrist flexors  clonus R ankle  Gen NAD. Vital signs reviewed.  Assessment/Plan: 1. Functional deficits secondary to Left MCA infarct Stable for D/C today F/u PCP in 3-4 weeks F/u PM&R 2 weeks F/u neurosurg 1-2 mo See D/C summary See D/C instructions FIM: Function - Bathing Position: Shower Body parts bathed by patient: Right arm, Left arm, Chest, Abdomen, Front perineal area, Right upper leg, Left upper leg, Buttocks, Right lower leg, Left lower leg Body parts bathed by helper: Back Assist Level: Touching or steadying assistance(Pt > 75%)  Function- Upper Body Dressing/Undressing What is the patient wearing?: Pull over shirt/dress Pull over shirt/dress - Perfomed by patient: Thread/unthread left sleeve, Put head through opening, Pull shirt over trunk, Thread/unthread right sleeve Pull over shirt/dress - Perfomed by helper: Thread/unthread right sleeve, Thread/unthread left  sleeve, Put head through opening, Pull shirt over trunk Assist Level: Touching or steadying assistance(Pt > 75%) Function - Lower Body Dressing/Undressing What is the patient wearing?: Pants, Underwear, Socks, Shoes, AFO Position: Other (comment)(Sitting on tub bench) Underwear - Performed by patient: Pull underwear up/down, Thread/unthread left underwear leg Underwear - Performed by helper: Thread/unthread right underwear leg Pants- Performed by patient: Pull pants up/down, Thread/unthread left pants leg Pants- Performed by helper: Thread/unthread right pants leg, Fasten/unfasten pants Non-skid slipper socks- Performed by patient: Don/doff left sock Non-skid slipper socks- Performed by helper: Don/doff right sock, Don/doff left sock Socks - Performed by patient: Don/doff right sock, Don/doff left sock Socks - Performed by helper: Don/doff right sock Shoes - Performed by patient: Don/doff left shoe Shoes - Performed by helper: Don/doff right shoe, Fasten left, Fasten right AFO - Performed by helper: Don/doff right AFO Assist for footwear: Partial/moderate assist Assist for lower body dressing: Touching or steadying assistance (Pt > 75%)  Function - Toileting Toileting activity did not occur: No continent bowel/bladder event Toileting steps completed by patient: Adjust clothing prior to toileting, Performs perineal hygiene, Adjust clothing after toileting Toileting steps completed by helper: Adjust clothing prior to toileting, Performs perineal hygiene, Adjust clothing after toileting Toileting Assistive Devices: Grab bar or rail(RW) Assist level: Touching or steadying assistance (Pt.75%)  Function - Archivist transfer activity did not occur: Safety/medical concerns Toilet transfer assistive device: Grab bar Mechanical lift: Stedy Assist level to toilet: Touching or steadying assistance (Pt > 75%) Assist level from toilet: Touching or steadying assistance (Pt >  75%) Assist level to bedside commode (at bedside): Touching or steadying assistance (Pt > 75%) Assist  level from bedside commode (at bedside): Touching or steadying assistance (Pt > 75%)  Function - Chair/bed transfer Chair/bed transfer method: Squat pivot Chair/bed transfer assist level: Touching or steadying assistance (Pt > 75%) Chair/bed transfer assistive device: Armrests Mechanical lift: Stedy Chair/bed transfer details: Manual facilitation for placement, Manual facilitation for weight bearing, Verbal cues for technique, Verbal cues for sequencing, Manual facilitation for weight shifting, Verbal cues for safe use of DME/AE  Function - Locomotion: Wheelchair Will patient use wheelchair at discharge?: Yes Type: Manual Max wheelchair distance: 12550ft  Assist Level: Supervision or verbal cues Assist Level: Supervision or verbal cues Assist Level: Supervision or verbal cues Turns around,maneuvers to table,bed, and toilet,negotiates 3% grade,maneuvers on rugs and over doorsills: Yes Function - Locomotion: Ambulation Ambulation activity did not occur: Safety/medical concerns Assistive device: Walker-rolling Max distance: 7030ft  Assist level: Moderate assist (Pt 50 - 74%) Walk 10 feet activity did not occur: Safety/medical concerns Assist level: Moderate assist (Pt 50 - 74%) Walk 50 feet with 2 turns activity did not occur: Safety/medical concerns Assist level: 2 helpers Walk 150 feet activity did not occur: Safety/medical concerns Walk 10 feet on uneven surfaces activity did not occur: Safety/medical concerns  Function - Comprehension Comprehension: Auditory Comprehension assist level: Understands complex 90% of the time/cues 10% of the time, Follows complex conversation/direction with extra time/assistive device  Function - Expression Expression: Verbal Expression assist level: Expresses complex 90% of the time/cues < 10% of the time  Function - Social Interaction Social  Interaction assist level: Interacts appropriately 90% of the time - Needs monitoring or encouragement for participation or interaction.  Function - Problem Solving Problem solving assist level: Solves complex 90% of the time/cues < 10% of the time, Solves complex problems: With extra time  Function - Memory Memory assist level: Complete Independence: No helper Patient normally able to recall (first 3 days only): Staff names and faces, Location of own room, Current season, That he or she is in a hospital  Medical Problem List and Plan: 1.Dense right hemiparesis and aphasia/dysphagiasecondary to left large MCA infarction/left M1 occlusion/ACA infarction status post TPA with unsuccessful attempt at mechanical thrombectomy. Status post decompressive hemicraniectomy placement of bone flap abdominal pocket 07/29/2017. Will order protective helmet d/c home today   - LOS extended to 2/22, cont PT, OT,SLP 2. DVT Prophylaxis/Anticoagulation: Subcutaneous Lovenox initiated 07/31/2017. Venous Doppler studies negative 3. Pain Management:Tylenol as needed, pain behind left eye discussed with optho, not likely due to pressure on optic nerve as that will cause loss of vision but not pain.  Treating HA with topiramate, 25mg  increase to BID  Likely related to his cerebral edema.        Repeat CT showed improvement 4. Mood:Provide emotional support.Patient with coping issues due to his diagnosis and physical losses. Social worker to screen. Neuropsychology to be involved as well. Now on Ritalin and Lexapro,  5. Neuropsych: This patient isnot completelycapable of making decisions on hisown behalf. 6. Skin/Wound Care:Routine skin checks, skin healing well scalp and abd, s/p staple removal 7. Fluids/Electrolytes/Nutrition:Routine I&O's- Eats 100% meals    8.Dysphagia. Advanced to regular thin diet on 2/7  9.Hypertension.Monitor with increased mobility Vitals:   09/06/17 1500 09/07/17 0500   BP: 131/89 (!) 134/93  Pulse: 86 71  Resp: 16 17  Temp: 98 F (36.7 C) 99 F (37.2 C)  SpO2: 99% 100%    Controlled 2/22 10.Hyperlipidemia. Zetia/Lipitor 11. Elevated transanimase likely lipitor but < 3x ULN, monitor 12.  Bladder incontinence nocturnal.  This is likely due to spastic bladder from stroke, staph epi likely contaminant afebrile 13.  Bowel incontinence resolved.  14. Leukocytosis resolved     15. Acute blood loss anemia   Hemoglobin 11.7 on 1/23   Hemoglobin 12.6 on 08/27/2017  LOS (Days) 31 A FACE TO FACE EVALUATION WAS PERFORMED  Erick Colace 09/07/2017, 7:45 AM

## 2017-09-07 NOTE — Progress Notes (Signed)
Social Work  Discharge Note  The overall goal for the admission was met for:   Discharge location: Yes-HOME WITH MOM, WIFE AND STEP-SON  Length of Stay: Yes-31 DAYS  Discharge activity level: Yes-MIN [facility]  Home/community participation: Yes  Services provided included: MD, RD, PT, OT, SLP, RN, CM, TR, Pharmacy, Neuropsych and SW  Financial Services: Private Insurance: [place]  Follow-up services arranged: Home Health: [facility]-PT,OT,SP,AIDE, DME: [place] [facility], DROP-ARM BEDSIDE COMMODE and Patient/Family has no preference for HH/DME agencies  Comments (or additional information):MOM HAS DONE HANDS ON CARE ALL WEEK, WIFE HAS BEEN HERE TO OBSERVE BUT CAN NOT ASSIST DUE TO PREGNANCY. ALL FEEL COMFORTABLE WITH HIS CARE AND ARE READY TO GO HOME  Patient/Family verbalized understanding of follow-up arrangements: Yes  Individual responsible for coordination of the follow-up plan: MELANIE-WIFE & LISA-MOM  Confirmed correct DME delivered: Elease Hashimoto 09/07/2017    Elease Hashimoto

## 2017-09-10 ENCOUNTER — Telehealth: Payer: Self-pay | Admitting: *Deleted

## 2017-09-10 NOTE — Telephone Encounter (Signed)
Transitional care call completed. Appointment confirmed. Address confirmed, changed to [address], [address], [place], [place], [id number], New patient packet rerouted to this address.  Transitional Care Questions   Questions for our staff to ask patients on Transitional care 48 hour phone call:   1. Are you/is patient experiencing any problems since coming home?  No  Are there any questions regarding any aspect of care? No  2. Are there any questions regarding medications administration/dosing? Yes, patient needs something for abdominal pain. Apparently, part of his treatment was the removal of a portion of skull that was placed internally in his abdomen for storage.  Are meds being taken as prescribed? Yes Patient should review meds with caller to confirm   3. Have there been any falls?  No  4. Has Home Health been to the house and/or have they contacted you? Yes, PT has finished initial eval, OT and ST are supposed to come tomorrow If not, have you tried to contact them? Can we help you contact them?   5. Are bowels and bladder emptying properly?  Yes Are there any unexpected incontinence issues?  No If applicable, is patient following bowel/bladder programs?   6. Any fevers, problems with breathing, unexpected pain?  No fever, no breathing problems, Abdominal site pain  7. Are there any skin problems or new areas of breakdown?  No  8. Has the patient/family member arranged specialty MD follow up (ie cardiology/neurology/renal/surgical/etc)? Yes Can we help arrange?  No  9. Does the patient need any other services or support that we can help arrange?  No  10. Are caregivers following through as expected in assisting the patient?  Yes  11. Has the patient quit smoking, drinking alcohol, or using drugs as recommended?  No drink, No smoke, No illicit drug use

## 2017-09-11 ENCOUNTER — Telehealth: Payer: Self-pay

## 2017-09-14 ENCOUNTER — Telehealth: Payer: Self-pay | Admitting: *Deleted

## 2017-09-14 NOTE — Telephone Encounter (Signed)
Jeremy [name], ST, [facility] is asking for verbal orders to grant permission to receive orders from a secondary provider, Dr. Jerl MinaJames Hedrick (patient's PCP). She explained this is something new that they are required to do to satisfy CMS. She explained that the requests need to be made for a specific provider in order to ensure proper compensation. Please Advise on this request and how to handle future requests like this. She also [facility] which I gave per office protocol.

## 2017-09-14 NOTE — Telephone Encounter (Signed)
It is generally okay with me for the patient's PCP to be able to give orders to home health

## 2017-09-19 NOTE — Telephone Encounter (Signed)
Verbal order given per office protocol 

## 2017-09-20 ENCOUNTER — Telehealth: Payer: Self-pay | Admitting: *Deleted

## 2017-09-20 NOTE — Telephone Encounter (Addendum)
HH ST calls to report    #1. Patient has had recent onset of leg pain.  Patient's mother has stopped patient's Lipitor and Zetia citing family history of reaction to these medications.    #2. Patient's heart rate was 118 with no symptoms, previously recorder at 125 bpm's.  The low number has been in the upper 90's. Any recommendations would be appreciated.

## 2017-09-23 NOTE — Telephone Encounter (Signed)
Pt needs to follow up medically with Dr Jerl MinaJames Hedrick his PCP.  Stopping Zetia and Lipitor will increase risk for another CVA.  Pt can discuss stroke prevention with his neurologist Dr Pearlean BrownieSethi

## 2017-09-24 ENCOUNTER — Encounter: Payer: BLUE CROSS/BLUE SHIELD | Attending: Registered Nurse | Admitting: Registered Nurse

## 2017-09-24 ENCOUNTER — Encounter: Payer: Self-pay | Admitting: Registered Nurse

## 2017-09-24 ENCOUNTER — Other Ambulatory Visit: Payer: Self-pay

## 2017-09-24 ENCOUNTER — Telehealth: Payer: Self-pay | Admitting: *Deleted

## 2017-09-24 VITALS — BP 114/82 | HR 97

## 2017-09-24 DIAGNOSIS — G4730 Sleep apnea, unspecified: Secondary | ICD-10-CM | POA: Diagnosis not present

## 2017-09-24 DIAGNOSIS — Z9889 Other specified postprocedural states: Secondary | ICD-10-CM | POA: Insufficient documentation

## 2017-09-24 DIAGNOSIS — I10 Essential (primary) hypertension: Secondary | ICD-10-CM | POA: Insufficient documentation

## 2017-09-24 DIAGNOSIS — F329 Major depressive disorder, single episode, unspecified: Secondary | ICD-10-CM | POA: Insufficient documentation

## 2017-09-24 DIAGNOSIS — I63512 Cerebral infarction due to unspecified occlusion or stenosis of left middle cerebral artery: Secondary | ICD-10-CM | POA: Insufficient documentation

## 2017-09-24 DIAGNOSIS — F419 Anxiety disorder, unspecified: Secondary | ICD-10-CM | POA: Diagnosis not present

## 2017-09-24 DIAGNOSIS — J45909 Unspecified asthma, uncomplicated: Secondary | ICD-10-CM | POA: Diagnosis not present

## 2017-09-24 DIAGNOSIS — I69351 Hemiplegia and hemiparesis following cerebral infarction affecting right dominant side: Secondary | ICD-10-CM

## 2017-09-24 DIAGNOSIS — G441 Vascular headache, not elsewhere classified: Secondary | ICD-10-CM | POA: Insufficient documentation

## 2017-09-24 MED ORDER — METHYLPHENIDATE HCL 5 MG PO TABS: 5.0000 mg | ORAL_TABLET | Freq: Two times a day (BID) | ORAL | 0 refills | Status: DC

## 2017-09-24 MED ORDER — TOPIRAMATE 25 MG PO TABS: 25.0000 mg | ORAL_TABLET | Freq: Two times a day (BID) | ORAL | 0 refills | Status: DC

## 2017-09-24 NOTE — Telephone Encounter (Signed)
Please send order to [facility] to adjust Right AFO

## 2017-09-24 NOTE — Progress Notes (Addendum)
Subjective:    Patient ID: Jeremy [name], male    DOB: [birth year], 42 y.o.   MRN: [id number]  HPI: Jeremy [name] is a 42 year old male who is here for a transitional care visit in follow up of his Left Middle Cerebral Artery Infarction  ( underwent left decompressive hemicraniectomy placement of bone flap in abdominal pocket), hypertension and vascular headache. He has been home with home health therapies from [facility]. He denies any pain. He's walking with physical therapist only. Also reports hehis appetite is good.   His wife and mother in room, all questions answered.   Pain Inventory Average Pain 5 Pain Right Now 7 My pain is dull, stabbing and aching  In the last 24 hours, has pain interfered with the following? General activity 7 Relation with others 7 Enjoyment of life 8 What TIME of day is your pain at its worst? daytime and evening Sleep (in general) Fair  Pain is worse with: walking, bending, sitting and standing Pain improves with: rest, heat/ice, therapy/exercise and medication Relief from Meds: 3  Mobility walk with assistance use a walker ability to climb steps?  no do you drive?  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  no  Physicians involved in your care Any changes since last visit?  no   No family history on file. Social History   Socioeconomic History  . Marital status: Married    Spouse name: None  . Number of children: None  . Years of education: None  . Highest education level: None  Social Needs  . Financial resource strain: None  . Food insecurity - worry: None  . Food insecurity - inability:  None  . Transportation needs - medical: None  . Transportation needs - non-medical: None  Occupational History  . None  Tobacco Use  . Smoking status: Never Smoker  . Smokeless tobacco: Never Used  Substance and Sexual Activity  . Alcohol use: Yes    Comment: RARE  . Drug use: No  . Sexual activity: None  Other Topics Concern  . None  Social History Narrative  . None   Past Surgical History:  Procedure Laterality Date  . CRANIOTOMY Left 07/29/2017   Procedure: LEFT HEMI- CRANIECTOMY WITH PLACEMENT OF BONE FLAP IN ABDOMEN;  Surgeon: Lisbeth RenshawNundkumar, Neelesh, MD;  Location: [facility] OR;  Service: Neurosurgery;  Laterality: Left;  . IR ANGIO INTRA EXTRACRAN SEL COM CAROTID INNOMINATE UNI R MOD SED  07/28/2017  . IR ANGIO VERTEBRAL SEL VERTEBRAL UNI R MOD SED  07/28/2017  . IR CT HEAD LTD  07/28/2017  . IR PERCUTANEOUS ART THROMBECTOMY/INFUSION INTRACRANIAL INC DIAG ANGIO  07/28/2017  . NASAL SEPTOPLASTY W/ TURBINOPLASTY Bilateral 03/06/2017   Procedure: NASAL SEPTOPLASTY WITH TURBINATE REDUCTION;  Surgeon: Linus SalmonsMcQueen, Chapman, MD;  Location: [facility] ORS;  Service: ENT;  Laterality: Bilateral;  . RADIOLOGY WITH ANESTHESIA N/A 07/27/2017   Procedure: RADIOLOGY WITH ANESTHESIA;  Surgeon: Julieanne Cottoneveshwar, Sanjeev, MD;  Location: [facility] OR;  Service: Radiology;  Laterality: N/A;   Past Medical History:  Diagnosis Date  . Anxiety   . Asthma    MILD AS A CHILD  . Depression   . Deviated septum   .  Dyspnea    NORMAL SPIROMETRY 02/01/17 VISIT WITH DR HEDRICK  . Hypertension   . Sleep apnea    BP 114/82   Pulse 97   SpO2 98%   Opioid Risk Score:   Fall Risk Score:  `1  Depression screen PHQ 2/9  Depression screen PHQ 2/9 09/24/2017  Decreased Interest 2  Down, Depressed, Hopeless 1  PHQ - 2 Score 3  Altered sleeping 0  Tired, decreased energy 2  Change in appetite 2  Feeling bad or failure about yourself  1  Trouble concentrating 0  Moving slowly or fidgety/restless 0  Suicidal thoughts 0  PHQ-9 Score 8   Difficult doing work/chores Somewhat difficult    Review of Systems  Constitutional: Positive for appetite change, diaphoresis and unexpected weight change.  HENT: Negative.   Eyes: Negative.   Respiratory: Positive for apnea.   Cardiovascular: Negative.   Gastrointestinal: Positive for nausea and vomiting.  Endocrine: Negative.   Genitourinary: Negative.   Musculoskeletal: Positive for gait problem.       Spasms  Skin: Positive for rash.  Allergic/Immunologic: Negative.   Neurological: Positive for tremors, weakness and numbness.       Tingling  Hematological: Negative.   Psychiatric/Behavioral: Positive for dysphoric mood.  All other systems reviewed and are negative.      Objective:   Physical Exam  Constitutional: He is oriented to person, place, and time. He appears well-developed and well-nourished.  HENT:  Head: Normocephalic and atraumatic.  Wearing Helmet  Neck: Normal range of motion. Neck supple.  Cardiovascular: Normal rate and regular rhythm.  Pulmonary/Chest: Effort normal and breath sounds normal.  Musculoskeletal:  Normal Muscle Bulk and Muscle Testing Reveals: Upper Extremities: Right: Paralysis Left: Full ROM and Muscle Strength 5/5  Lower Extremities: Right Paralysis Left: Full ROM and Muscle Strength 5/5 Arrived in wheelchair  Neurological: He is alert and oriented to person, place, and time.  Skin: Skin is warm and dry.  Psychiatric: He has a normal mood and affect.  Nursing note and vitals reviewed.         Assessment & Plan:  1. Left Middle Cerebral Artery Infarction/ S/P Left Decompressive hemicraniectomy: Continue Home Therapies. Continue wearing Helmet. 2. Attention Deficit Related to CVA: Continue with Ritalin to aid with focus.  3. HTN: Continue current medication. PCP Following.  4. Vascular Headache: Continue Topamax  60 minutes of face to face patient care time was spent during this visit. All questions were encouraged and  answered.  F/U with Dr Wynn Banker in 4-6 weeks

## 2017-09-24 NOTE — Telephone Encounter (Signed)
[name]'s wife Jeremy [name] called and said that they had been to [facility] and they had to make adjustments to his leg brace.  They are needing an Rx to [facility].  Please advise.

## 2017-09-25 ENCOUNTER — Encounter (HOSPITAL_COMMUNITY): Payer: Self-pay | Admitting: Occupational Therapy

## 2017-09-25 NOTE — Telephone Encounter (Signed)
Faxed to [facility].

## 2017-10-04 ENCOUNTER — Other Ambulatory Visit: Payer: Self-pay | Admitting: Family Medicine

## 2017-10-04 DIAGNOSIS — R600 Localized edema: Secondary | ICD-10-CM

## 2017-10-05 ENCOUNTER — Emergency Department
Admission: EM | Admit: 2017-10-05 | Discharge: 2017-10-06 | Disposition: A | Discharge status: Other Acute Inpatient Hospital | Payer: BLUE CROSS/BLUE SHIELD | Attending: Emergency Medicine | Admitting: Emergency Medicine

## 2017-10-05 ENCOUNTER — Emergency Department: Payer: BLUE CROSS/BLUE SHIELD

## 2017-10-05 ENCOUNTER — Other Ambulatory Visit: Payer: Self-pay | Admitting: *Deleted

## 2017-10-05 ENCOUNTER — Other Ambulatory Visit: Payer: Self-pay

## 2017-10-05 ENCOUNTER — Encounter: Payer: Self-pay | Admitting: Emergency Medicine

## 2017-10-05 ENCOUNTER — Telehealth: Payer: Self-pay | Admitting: *Deleted

## 2017-10-05 ENCOUNTER — Ambulatory Visit
Admission: RE | Admit: 2017-10-05 | Discharge: 2017-10-05 | Disposition: A | Discharge status: Home / Self Care | Payer: BLUE CROSS/BLUE SHIELD | Source: Ambulatory Visit | Attending: Family Medicine | Admitting: Family Medicine

## 2017-10-05 DIAGNOSIS — I10 Essential (primary) hypertension: Secondary | ICD-10-CM | POA: Insufficient documentation

## 2017-10-05 DIAGNOSIS — R600 Localized edema: Secondary | ICD-10-CM

## 2017-10-05 DIAGNOSIS — I2699 Other pulmonary embolism without acute cor pulmonale: Secondary | ICD-10-CM | POA: Insufficient documentation

## 2017-10-05 DIAGNOSIS — I82412 Acute embolism and thrombosis of left femoral vein: Secondary | ICD-10-CM | POA: Insufficient documentation

## 2017-10-05 DIAGNOSIS — I82A12 Acute embolism and thrombosis of left axillary vein: Secondary | ICD-10-CM | POA: Insufficient documentation

## 2017-10-05 DIAGNOSIS — Z7982 Long term (current) use of aspirin: Secondary | ICD-10-CM | POA: Diagnosis not present

## 2017-10-05 DIAGNOSIS — Z79899 Other long term (current) drug therapy: Secondary | ICD-10-CM | POA: Insufficient documentation

## 2017-10-05 DIAGNOSIS — J45909 Unspecified asthma, uncomplicated: Secondary | ICD-10-CM | POA: Insufficient documentation

## 2017-10-05 DIAGNOSIS — R2242 Localized swelling, mass and lump, left lower limb: Secondary | ICD-10-CM | POA: Diagnosis present

## 2017-10-05 HISTORY — DX: Cerebral infarction, unspecified: I63.9

## 2017-10-05 LAB — BASIC METABOLIC PANEL
Anion gap: 8 (ref 5–15)
BUN: 16 mg/dL (ref 6–20)
CALCIUM: 9.4 mg/dL (ref 8.9–10.3)
CHLORIDE: 104 mmol/L (ref 101–111)
CO2: 27 mmol/L (ref 22–32)
CREATININE: 1 mg/dL (ref 0.61–1.24)
GFR calc Af Amer: >60 mL/min (ref >60)
GFR calc non Af Amer: >60 mL/min (ref >60)
Glucose, Bld: 105 mg/dL — ABNORMAL HIGH (ref 65–99)
Potassium: 3.7 mmol/L (ref 3.5–5.1)
SODIUM: 139 mmol/L (ref 135–145)

## 2017-10-05 LAB — PROTIME-INR
INR: 1.03
PROTHROMBIN TIME: 13.4 s (ref 11.4–15.2)

## 2017-10-05 LAB — CBC
HCT: 41.8 % (ref 40.0–52.0)
HEMOGLOBIN: 14.2 g/dL (ref 13.0–18.0)
MCH: 28 pg (ref 26.0–34.0)
MCHC: 33.9 g/dL (ref 32.0–36.0)
MCV: 82.6 fL (ref 80.0–100.0)
Platelets: 338 10*3/uL (ref 150–440)
RBC: 5.06 MIL/uL (ref 4.40–5.90)
RDW: 14.5 % (ref 11.5–14.5)
WBC: 6.5 10*3/uL (ref 3.8–10.6)

## 2017-10-05 LAB — TROPONIN I: Troponin I: <0.03 ng/mL (ref <0.03)

## 2017-10-05 LAB — APTT: aPTT: 27 seconds (ref 24–36)

## 2017-10-05 MED ORDER — GABAPENTIN 100 MG PO CAPS
200.0000 mg | ORAL_CAPSULE | Freq: Once | ORAL | Status: AC
  Administered 2017-10-05: 200 mg via ORAL
  Filled 2017-10-05: qty 2

## 2017-10-05 MED ORDER — HEPARIN BOLUS VIA INFUSION
5000.0000 [IU] | Freq: Once | INTRAVENOUS | Status: DC
  Filled 2017-10-05: qty 5000

## 2017-10-05 MED ORDER — TOPIRAMATE 25 MG PO TABS
25.0000 mg | ORAL_TABLET | Freq: Once | ORAL | Status: AC
  Administered 2017-10-05: 25 mg via ORAL
  Filled 2017-10-05: qty 1

## 2017-10-05 MED ORDER — SPIRONOLACTONE 50 MG PO TABS
50.0000 mg | ORAL_TABLET | Freq: Every day | ORAL | Status: DC
  Administered 2017-10-05: 50 mg via ORAL
  Filled 2017-10-05 (×2): qty 1

## 2017-10-05 MED ORDER — IOPAMIDOL (ISOVUE-370) INJECTION 76%
75.0000 mL | Freq: Once | INTRAVENOUS | Status: AC | PRN
  Administered 2017-10-05: 75 mL via INTRAVENOUS

## 2017-10-05 MED ORDER — HEPARIN (PORCINE) IN NACL 100-0.45 UNIT/ML-% IJ SOLN
1500.0000 [IU]/h | INTRAMUSCULAR | Status: DC
  Filled 2017-10-05 (×2): qty 250

## 2017-10-05 MED ORDER — HEPARIN SODIUM (PORCINE) 5000 UNIT/ML IJ SOLN
INTRAMUSCULAR | Status: AC
  Administered 2017-10-05: 5000 [IU]
  Filled 2017-10-05: qty 1

## 2017-10-05 MED ORDER — AMLODIPINE BESYLATE 5 MG PO TABS
5.0000 mg | ORAL_TABLET | Freq: Once | ORAL | Status: AC
  Administered 2017-10-05: 5 mg via ORAL
  Filled 2017-10-05: qty 1

## 2017-10-05 NOTE — ED Notes (Signed)
Patient transported to CT 

## 2017-10-05 NOTE — Progress Notes (Signed)
ANTICOAGULATION CONSULT NOTE - Initial Consult  Pharmacy Consult for Heparin  Indication: DVT  Allergies  Allergen Reactions  . Blood-Group Specific Substance Other (See Comments)    PT JEHOVAHS WITNESS DOES NOT ACCEPT BLOOD PRODUCTS  . Lipitor [Atorvastatin Calcium] Other (See Comments)    myalgia    Patient Measurements: Height: 5\' 11"  (180.3 cm) Weight: 189 lb (85.7 kg) IBW/kg (Calculated) : 75.3 Heparin Dosing Weight: 85.7 kg   Vital Signs: Temp: 97.5 F (36.4 C) (03/22 2127) Temp Source: Oral (03/22 2127) BP: 133/90 (03/22 2127) Pulse Rate: 95 (03/22 2127)  Labs: Recent Labs    10/05/17 1855  HGB 14.2  HCT 41.8  PLT 338  APTT 27  LABPROT 13.4  INR 1.03  CREATININE 1.00  TROPONINI <0.03    Estimated Creatinine Clearance: 105.6 mL/min (by C-G formula based on SCr of 1 mg/dL).   Medical History: Past Medical History:  Diagnosis Date  . Anxiety   . Asthma    MILD AS A CHILD  . Depression   . Deviated septum   . Dyspnea    NORMAL SPIROMETRY 02/01/17 VISIT WITH DR HEDRICK  . Hypertension   . Sleep apnea   . Stroke [facility](H[facility])     Medications:   (Not in a hospital admission)  Assessment: Pharmacy consulted to dose heparin in this 42 year old male admitted with DVT.  No prior anticoag noted.  CrCl = 105.6 ml/min   Goal of Therapy:  Heparin level 0.3-0.7 units/ml Monitor platelets by anticoagulation protocol: Yes   Plan:  Give 5100 units bolus x 1 Start heparin infusion at 1500 units/hr Check anti-Xa level in 6 hours and daily while on heparin Continue to monitor H&H and platelets  Medard Decuir D 10/05/2017,9:46 PM

## 2017-10-05 NOTE — ED Provider Notes (Signed)
[facility] Emergency Department Provider Note  ____________________________________________  Time seen: Approximately 8:11 PM  I have reviewed the triage vital signs and the nursing notes.   HISTORY  Chief Complaint No chief complaint on file.   HPI Jeremy [name] is a 42 y.o. male with a history of a large left MCA stroke complicated by traumatic SAH, edema, herniation, and craniotomy on 07/2017 who presents for evaluation of left lower extremity swelling.  Patient has had progressively worsening swelling of the leg for the last week. Swelling is constant and severe. Patient is not bed bound. Not on anticoagulation. Patient was sent by his primary care doctor for a Doppler study today which is positive for extensive occlusive DVT extending from the left common femoral vein through the left tibial vein.  Patient denies chest pain or shortness of breath.   Past Medical History:  Diagnosis Date  . Anxiety   . Asthma    MILD AS A CHILD  . Depression   . Deviated septum   . Dyspnea    NORMAL SPIROMETRY 02/01/17 VISIT WITH DR HEDRICK  . Hypertension   . Sleep apnea   . Stroke [facility])     Patient Active Problem List   Diagnosis Date Noted  . Acute blood loss anemia   . Vascular headache   . Spastic hemiplegia of right dominant side as late effect of cerebral infarction (HCC)   . Benign essential HTN   . Frontal lobe and executive function deficit following cerebral infarction   . Adjustment disorder with mixed anxiety and depressed mood   . 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 Aphasia due to acute stroke (HCC) 08/07/2017  . B12 deficiency   . Acute respiratory failure (HCC)   . Central line infiltration (HCC)   . Endotracheal tube present   . Fever   . Leukocytosis   . S/P craniotomy 07/30/2017  . Cerebral edema (HCC) 07/28/2017  . Hyperlipidemia 07/28/2017  . Stroke (cerebrum) (HCC) 07/27/2017    Past Surgical History:  Procedure  Laterality Date  . CRANIOTOMY Left 07/29/2017   Procedure: LEFT HEMI- CRANIECTOMY WITH PLACEMENT OF BONE FLAP IN ABDOMEN;  Surgeon: Lisbeth Renshaw, MD;  Location: [facility] OR;  Service: Neurosurgery;  Laterality: Left;  . IR ANGIO INTRA EXTRACRAN SEL COM CAROTID INNOMINATE UNI R MOD SED  07/28/2017  . IR ANGIO VERTEBRAL SEL VERTEBRAL UNI R MOD SED  07/28/2017  . IR CT HEAD LTD  07/28/2017  . IR PERCUTANEOUS ART THROMBECTOMY/INFUSION INTRACRANIAL INC DIAG ANGIO  07/28/2017  . NASAL SEPTOPLASTY W/ TURBINOPLASTY Bilateral 03/06/2017   Procedure: NASAL SEPTOPLASTY WITH TURBINATE REDUCTION;  Surgeon: Linus Salmons, MD;  Location: [facility] ORS;  Service: ENT;  Laterality: Bilateral;  . RADIOLOGY WITH ANESTHESIA N/A 07/27/2017   Procedure: RADIOLOGY WITH ANESTHESIA;  Surgeon: Julieanne Cotton, MD;  Location: [facility] OR;  Service: Radiology;  Laterality: N/A;    Prior to Admission medications   Medication Sig Start Date End Date Taking? Authorizing Provider  amLODipine (NORVASC) 5 MG tablet Take 5 mg by mouth daily.    Yes [provider]  aspirin 325 MG tablet Take 1 tablet (325 mg total) by mouth daily. 09/07/17  Yes Angiulli, Mcarthur Rossetti, PA-C  escitalopram (LEXAPRO) 10 MG tablet Take 1 tablet (10 mg total) by mouth daily. 09/07/17  Yes Angiulli, Mcarthur Rossetti, PA-C  gabapentin (NEURONTIN) 100 MG capsule Take 200 mg by mouth 3 (three) times daily.   Yes [provider]  methylphenidate (  RITALIN) 5 MG tablet Take 1 tablet (5 mg total) by mouth 2 (two) times daily with breakfast and lunch. 09/24/17  Yes Jones Bales, NP  pantoprazole (PROTONIX) 40 MG tablet Take 1 tablet (40 mg total) by mouth daily. 09/07/17  Yes Angiulli, Mcarthur Rossetti, PA-C  rosuvastatin (CRESTOR) 10 MG tablet Take 10 mg by mouth daily.   Yes [provider]  spironolactone (ALDACTONE) 50 MG tablet Take 50 mg by mouth daily.    Yes [provider]  topiramate (TOPAMAX) 25 MG tablet Take 1 tablet (25 mg total) by mouth 2  (two) times daily. 09/24/17  Yes Jones Bales, NP  polyethylene glycol (MIRALAX / GLYCOLAX) packet Take 17 g by mouth daily. Patient not taking: Reported on 10/05/2017 09/07/17   Charlton Amor, PA-C    Allergies Blood-group specific substance and Lipitor [atorvastatin calcium]  History reviewed. No pertinent family history.  Social History Social History   Tobacco Use  . Smoking status: Never Smoker  . Smokeless tobacco: Never Used  Substance Use Topics  . Alcohol use: Yes    Comment: RARE  . Drug use: No    Review of Systems  Constitutional: Negative for fever. Eyes: Negative for visual changes. ENT: Negative for sore throat. Neck: No neck pain  Cardiovascular: Negative for chest pain. Respiratory: Negative for shortness of breath. Gastrointestinal: Negative for abdominal pain, vomiting or diarrhea. Genitourinary: Negative for dysuria. Musculoskeletal: Negative for back pain. + L leg swelling Skin: Negative for rash. Neurological: Negative for headaches, weakness or numbness. Psych: No SI or HI  ____________________________________________   PHYSICAL EXAM:  VITAL SIGNS: ED Triage Vitals  Enc Vitals Group     BP 10/05/17 1818 (!) 123/99     Pulse Rate 10/05/17 1818 (!) 113     Resp 10/05/17 1818 18     Temp 10/05/17 1818 98.7 F (37.1 C)     Temp Source 10/05/17 1818 Oral     SpO2 10/05/17 1818 98 %     Weight 10/05/17 1819 189 lb (85.7 kg)     Height 10/05/17 1819 5\' 11"  (1.803 m)     Head Circumference --      Peak Flow --      Pain Score 10/05/17 1839 5     Pain Loc --      Pain Edu? --      Excl. in GC? --     Constitutional: Alert and oriented. Well appearing and in no apparent distress. HEENT:      Head: Normocephalic and atraumatic.         Eyes: Conjunctivae are normal. Sclera is non-icteric.       Mouth/Throat: Mucous membranes are moist.       Neck: Supple with no signs of meningismus. Cardiovascular: Regular rate and rhythm. No  murmurs, gallops, or rubs. 2+ symmetrical distal pulses are present in all extremities. No JVD. Respiratory: Normal respiratory effort. Lungs are clear to auscultation bilaterally. No wheezes, crackles, or rhonchi.  Gastrointestinal: Soft, non tender, and non distended with positive bowel sounds. No rebound or guarding. Musculoskeletal: Asymmetric edema of the left lower extremity  neurologic: Normal speech and language. Face is symmetric. Moving all extremities. No gross focal neurologic deficits are appreciated. Skin: Skin is warm, dry and intact. No rash noted. Psychiatric: Mood and affect are normal. Speech and behavior are normal.  ____________________________________________   LABS (all labs ordered are listed, but only abnormal results are displayed)  Labs Reviewed  BASIC METABOLIC  PANEL - Abnormal; Notable for the following components:      Result Value   Glucose, Bld 105 (*)    All other components within normal limits  CBC  PROTIME-INR  APTT  TROPONIN I   ____________________________________________  EKG  ED ECG REPORT I, Nita Sicklearolina Jameca Chumley, the attending physician, personally viewed and interpreted this ECG. Normal sinus rhythm, rate of 96, normal intervals, normal axis, no ST elevations or depressions.  Normal EKG. ____________________________________________  RADIOLOGY  I have personally reviewed the images performed during this visit and I agree with the Radiologist's read.   Interpretation by Radiologist:  Ct Angio Chest Pe W And/or Wo Contrast  Result Date: 10/05/2017 CLINICAL DATA:  Deep venous thrombosis with prior CVA in January with right-sided paralysis. Patient is not on blood thinners currently except for aspirin. EXAM: CT ANGIOGRAPHY CHEST WITH CONTRAST TECHNIQUE: Multidetector CT imaging of the chest was performed using the standard protocol during bolus administration of intravenous contrast. Multiplanar CT image reconstructions and MIPs were obtained  to evaluate the vascular anatomy. CONTRAST:  75mL ISOVUE-370 IOPAMIDOL (ISOVUE-370) INJECTION 76% COMPARISON:  CXR 08/03/2017 FINDINGS: Cardiovascular: Acute nonocclusive pulmonary emboli to the right lower lobe segmental branches. No heart strain. No aortic aneurysm or dissection. Heart is normal in size. No calcific coronary arteriosclerosis. Mediastinum/Nodes: No enlarged mediastinal, hilar, or axillary lymph nodes. Thyroid gland, trachea, and esophagus demonstrate no significant findings apart from some mild bilateral thyromegaly. Lungs/Pleura: Lungs are clear. No pleural effusion or pneumothorax. Upper Abdomen: Small hiatal hernia. No acute abnormality in the upper abdomen. Musculoskeletal: No chest wall abnormality. No acute or significant osseous findings. Review of the MIP images confirms the above findings. IMPRESSION: Small right lower lobe nonocclusive pulmonary emboli without right heart strain. No active pulmonary disease. These results were called by telephone at the time of interpretation on 10/05/2017 at 9:11 pm to Dr. Nita SickleAROLINA Kynan Peasley , who verbally acknowledged these results. Electronically Signed   By: Tollie Ethavid  Kwon M.D.   On: 10/05/2017 21:11   [place] Venous Img Lower Unilateral Left  Result Date: 10/05/2017 CLINICAL DATA:  Left lower extremity pain and edema for the past 8 days. Evaluate for DVT. EXAM: LEFT LOWER EXTREMITY VENOUS DOPPLER ULTRASOUND TECHNIQUE: Gray-scale sonography with graded compression, as well as color Doppler and duplex ultrasound were performed to evaluate the lower extremity deep venous systems from the level of the common femoral vein and including the common femoral, femoral, profunda femoral, popliteal and calf veins including the posterior tibial, peroneal and gastrocnemius veins when visible. The superficial great saphenous vein was also interrogated. Spectral Doppler was utilized to evaluate flow at rest and with distal augmentation maneuvers in the common femoral,  femoral and popliteal veins. COMPARISON:  None. FINDINGS: Contralateral Common Femoral Vein: Respiratory phasicity is normal and symmetric with the symptomatic side. No evidence of thrombus. Normal compressibility. There is hypoechoic occlusive thrombus within the left common femoral vein (image 7), extending to involve the saphenofemoral junction (image 10), the deep femoral vein (image 13), the proximal (image 16), mid (image 19) and distal (image 22) aspects of the left femoral vein as well as the proximal left popliteal vein (image 25). Nonocclusive DVT is seen within distal aspect of the left popliteal vein (image 29) extending to involve the left posterior tibial vein. Occlusive DVT is also seen within the left peroneal veins. Other Findings:  None. IMPRESSION: Examination is positive for extensive, predominantly occlusive, DVT extending from the left common femoral vein through the left tibial veins. These  results will be called to the ordering clinician or representative by the Radiologist Assistant, and communication documented in the PACS or zVision Dashboard. Electronically Signed   By: Simonne Come M.D.   On: 10/05/2017 17:20    ____________________________________________   PROCEDURES  Procedure(s) performed: None Procedures Critical Care performed: yes  CRITICAL CARE Performed by: Nita Sickle  ?  Total critical care time: 35 min  Critical care time was exclusive of separately billable procedures and treating other patients.  Critical care was necessary to treat or prevent imminent or life-threatening deterioration.  Critical care was time spent personally by me on the following activities: development of treatment plan with patient and/or surrogate as well as nursing, discussions with consultants, evaluation of patient's response to treatment, examination of patient, obtaining history from patient or surrogate, ordering and performing treatments and interventions, ordering  and review of laboratory studies, ordering and review of radiographic studies, pulse oximetry and re-evaluation of patient's condition.  ____________________________________________   INITIAL IMPRESSION / ASSESSMENT AND PLAN / ED COURSE  42 y.o. male with a history of a large left MCA stroke complicated by traumatic SAH, edema, herniation, and craniotomy on 07/2017 who presents for evaluation of left lower extremity swelling and US showing acute occlusive large DVT. Will consult neurosurgery before any further management since patient recently had massive ischemic stroke with SHA. patient has no chest pain or SOB but he is tachycardic. I will pursue CTA to rule out PE.     _________________________ 9:32 PM on 10/05/2017 -----------------------------------------  CT concerning for a small right lower lobe nonocclusive PE with no heart strain.  I spoke with Dr. Amada Jupiter, [facility] Neurologist who recommended starting patient on a heparin drip.  He says that 2 months out of everything that happened to this patient is not a contraindication for anticoagulation at this time.  I spoke with Dr. Gilford Silvius, [facility] who has accepted the patient is a transfer.  Patient is going to be started on heparin.  He is hemodynamically stable.     As part of my medical decision making, I reviewed the following data within the electronic MEDICAL RECORD NUMBER Nursing notes reviewed and incorporated, Labs reviewed , EKG interpreted , Old EKG reviewed, Old chart reviewed, Radiograph reviewed , Discussed with admitting physician , A consult was requested and obtained from this/these consultant(s) Neurology, Notes from prior ED visits and [place] Controlled Substance Database    Pertinent labs & imaging results that were available during my care of the patient were reviewed by me and considered in my medical decision making (see chart for details).    ____________________________________________   FINAL CLINICAL  IMPRESSION(S) / ED DIAGNOSES  Final diagnoses:  DVT of axillary vein, acute left (HCC)  Other acute pulmonary embolism without acute cor pulmonale (HCC)      NEW MEDICATIONS STARTED DURING THIS VISIT:  ED Discharge Orders    None       Note:  This document was prepared using Dragon voice recognition software and may include unintentional dictation errors.    Nita Sickle, MD 10/05/17 2134

## 2017-10-05 NOTE — ED Notes (Signed)
First Nurse Note:  Patient brought to ED by outpatient US.  US showed patient has a large occlusive clot in his left leg.

## 2017-10-05 NOTE — Telephone Encounter (Signed)
Error

## 2017-10-05 NOTE — ED Notes (Signed)
Dr. Seth BakeV at bedside, pt moved from [facility] to room 16

## 2017-10-05 NOTE — Telephone Encounter (Signed)
Paulla ForeAmy McKee, ST, [facility] left message asking for continuation of HHST 2week2.  Verbal orders given per office protocol

## 2017-10-05 NOTE — ED Triage Notes (Signed)
Pt arrived via POV from [facility] mall for US of the left leg and found blood clots. Pt had CVA in January, with right sided paralysis, pt had craniotomy in January as well.    Pt is not currently on blood thinners with the exception of ASA.  Family states she has noticed redness to left leg over the past few days which prompted. Swelling noted to be increased on the left than right.

## 2017-10-05 NOTE — ED Notes (Signed)
Dr. Seth BakeV at bedside with pt

## 2017-10-06 ENCOUNTER — Encounter (HOSPITAL_COMMUNITY): Payer: Self-pay | Admitting: Internal Medicine

## 2017-10-06 ENCOUNTER — Inpatient Hospital Stay (HOSPITAL_COMMUNITY)
Admission: AD | Admit: 2017-10-06 | Discharge: 2017-10-08 | DRG: 299 | Disposition: A | Discharge status: Home Health | Payer: BLUE CROSS/BLUE SHIELD | Source: Other Acute Inpatient Hospital | Attending: Nephrology | Admitting: Nephrology

## 2017-10-06 DIAGNOSIS — J45909 Unspecified asthma, uncomplicated: Secondary | ICD-10-CM | POA: Diagnosis present

## 2017-10-06 DIAGNOSIS — I82412 Acute embolism and thrombosis of left femoral vein: Secondary | ICD-10-CM | POA: Diagnosis present

## 2017-10-06 DIAGNOSIS — I63412 Cerebral infarction due to embolism of left middle cerebral artery: Secondary | ICD-10-CM

## 2017-10-06 DIAGNOSIS — Z7982 Long term (current) use of aspirin: Secondary | ICD-10-CM

## 2017-10-06 DIAGNOSIS — I69351 Hemiplegia and hemiparesis following cerebral infarction affecting right dominant side: Secondary | ICD-10-CM

## 2017-10-06 DIAGNOSIS — K219 Gastro-esophageal reflux disease without esophagitis: Secondary | ICD-10-CM | POA: Diagnosis present

## 2017-10-06 DIAGNOSIS — I2699 Other pulmonary embolism without acute cor pulmonale: Secondary | ICD-10-CM | POA: Diagnosis present

## 2017-10-06 DIAGNOSIS — E876 Hypokalemia: Secondary | ICD-10-CM | POA: Diagnosis present

## 2017-10-06 DIAGNOSIS — F329 Major depressive disorder, single episode, unspecified: Secondary | ICD-10-CM | POA: Diagnosis present

## 2017-10-06 DIAGNOSIS — Z9889 Other specified postprocedural states: Secondary | ICD-10-CM | POA: Diagnosis not present

## 2017-10-06 DIAGNOSIS — G4730 Sleep apnea, unspecified: Secondary | ICD-10-CM | POA: Diagnosis present

## 2017-10-06 DIAGNOSIS — I10 Essential (primary) hypertension: Secondary | ICD-10-CM | POA: Diagnosis present

## 2017-10-06 DIAGNOSIS — O2230 Deep phlebothrombosis in pregnancy, unspecified trimester: Secondary | ICD-10-CM | POA: Diagnosis present

## 2017-10-06 DIAGNOSIS — I82492 Acute embolism and thrombosis of other specified deep vein of left lower extremity: Secondary | ICD-10-CM | POA: Diagnosis not present

## 2017-10-06 DIAGNOSIS — Z79899 Other long term (current) drug therapy: Secondary | ICD-10-CM

## 2017-10-06 DIAGNOSIS — F419 Anxiety disorder, unspecified: Secondary | ICD-10-CM | POA: Diagnosis present

## 2017-10-06 DIAGNOSIS — F4323 Adjustment disorder with mixed anxiety and depressed mood: Secondary | ICD-10-CM | POA: Diagnosis present

## 2017-10-06 DIAGNOSIS — Z888 Allergy status to other drugs, medicaments and biological substances status: Secondary | ICD-10-CM

## 2017-10-06 DIAGNOSIS — I82442 Acute embolism and thrombosis of left tibial vein: Secondary | ICD-10-CM | POA: Diagnosis present

## 2017-10-06 DIAGNOSIS — E785 Hyperlipidemia, unspecified: Secondary | ICD-10-CM | POA: Diagnosis present

## 2017-10-06 DIAGNOSIS — I639 Cerebral infarction, unspecified: Secondary | ICD-10-CM | POA: Diagnosis present

## 2017-10-06 DIAGNOSIS — I82409 Acute embolism and thrombosis of unspecified deep veins of unspecified lower extremity: Secondary | ICD-10-CM | POA: Diagnosis not present

## 2017-10-06 LAB — BASIC METABOLIC PANEL
ANION GAP: 10 (ref 5–15)
BUN: 11 mg/dL (ref 6–20)
CO2: 24 mmol/L (ref 22–32)
CREATININE: 0.81 mg/dL (ref 0.61–1.24)
Calcium: 8.4 mg/dL — ABNORMAL LOW (ref 8.9–10.3)
Chloride: 101 mmol/L (ref 101–111)
GFR calc Af Amer: >60 mL/min (ref >60)
GFR calc non Af Amer: >60 mL/min (ref >60)
GLUCOSE: 171 mg/dL — AB (ref 65–99)
Potassium: 3.1 mmol/L — ABNORMAL LOW (ref 3.5–5.1)
Sodium: 135 mmol/L (ref 135–145)

## 2017-10-06 LAB — GLUCOSE, CAPILLARY: GLUCOSE-CAPILLARY: 106 mg/dL — AB (ref 65–99)

## 2017-10-06 LAB — CBC
HCT: 35.6 % — ABNORMAL LOW (ref 39.0–52.0)
Hemoglobin: 11.5 g/dL — ABNORMAL LOW (ref 13.0–17.0)
MCH: 27.7 pg (ref 26.0–34.0)
MCHC: 32.3 g/dL (ref 30.0–36.0)
MCV: 85.8 fL (ref 78.0–100.0)
PLATELETS: 289 10*3/uL (ref 150–400)
RBC: 4.15 MIL/uL — ABNORMAL LOW (ref 4.22–5.81)
RDW: 14 % (ref 11.5–15.5)
WBC: 5.2 10*3/uL (ref 4.0–10.5)

## 2017-10-06 LAB — BRAIN NATRIURETIC PEPTIDE: B Natriuretic Peptide: 1.7 pg/mL (ref 0.0–100.0)

## 2017-10-06 LAB — MAGNESIUM: Magnesium: 2 mg/dL (ref 1.7–2.4)

## 2017-10-06 LAB — HEPARIN LEVEL (UNFRACTIONATED)
HEPARIN UNFRACTIONATED: 0.49 [IU]/mL (ref 0.30–0.70)
Heparin Unfractionated: 0.57 IU/mL (ref 0.30–0.70)

## 2017-10-06 MED ORDER — POTASSIUM CHLORIDE 20 MEQ/15ML (10%) PO SOLN
40.0000 meq | Freq: Once | ORAL | Status: AC
  Administered 2017-10-06: 40 meq via ORAL
  Filled 2017-10-06: qty 30

## 2017-10-06 MED ORDER — POLYETHYLENE GLYCOL 3350 17 G PO PACK: 17.0000 g | PACK | Freq: Every day | ORAL | Status: DC | PRN

## 2017-10-06 MED ORDER — GABAPENTIN 100 MG PO CAPS
200.0000 mg | ORAL_CAPSULE | Freq: Three times a day (TID) | ORAL | Status: DC
  Administered 2017-10-06 – 2017-10-08 (×7): 200 mg via ORAL
  Filled 2017-10-06 (×7): qty 2

## 2017-10-06 MED ORDER — ONDANSETRON HCL 4 MG/2ML IJ SOLN: 4.0000 mg | Freq: Four times a day (QID) | INTRAMUSCULAR | Status: DC | PRN

## 2017-10-06 MED ORDER — ONDANSETRON HCL 4 MG PO TABS: 4.0000 mg | ORAL_TABLET | Freq: Four times a day (QID) | ORAL | Status: DC | PRN

## 2017-10-06 MED ORDER — ASPIRIN 325 MG PO TABS
325.0000 mg | ORAL_TABLET | Freq: Every day | ORAL | Status: DC
  Administered 2017-10-06 – 2017-10-08 (×3): 325 mg via ORAL
  Filled 2017-10-06 (×3): qty 1

## 2017-10-06 MED ORDER — OXYCODONE-ACETAMINOPHEN 5-325 MG PO TABS
1.0000 | ORAL_TABLET | ORAL | Status: DC | PRN
  Administered 2017-10-06: 1 via ORAL
  Filled 2017-10-06: qty 1

## 2017-10-06 MED ORDER — AMLODIPINE BESYLATE 5 MG PO TABS
5.0000 mg | ORAL_TABLET | Freq: Every day | ORAL | Status: DC
  Administered 2017-10-06 – 2017-10-08 (×3): 5 mg via ORAL
  Filled 2017-10-06 (×3): qty 1

## 2017-10-06 MED ORDER — DM-GUAIFENESIN ER 30-600 MG PO TB12: 1.0000 | ORAL_TABLET | Freq: Two times a day (BID) | ORAL | Status: DC | PRN

## 2017-10-06 MED ORDER — ACETAMINOPHEN 650 MG RE SUPP: 650.0000 mg | Freq: Four times a day (QID) | RECTAL | Status: DC | PRN

## 2017-10-06 MED ORDER — ACETAMINOPHEN 325 MG PO TABS: 650.0000 mg | ORAL_TABLET | Freq: Four times a day (QID) | ORAL | Status: DC | PRN

## 2017-10-06 MED ORDER — HYDRALAZINE HCL 20 MG/ML IJ SOLN: 5.0000 mg | INTRAMUSCULAR | Status: DC | PRN

## 2017-10-06 MED ORDER — HEPARIN (PORCINE) IN NACL 100-0.45 UNIT/ML-% IJ SOLN
1500.0000 [IU]/h | INTRAMUSCULAR | Status: DC
  Administered 2017-10-06 – 2017-10-07 (×4): 1500 [IU]/h via INTRAVENOUS
  Filled 2017-10-06 (×3): qty 250

## 2017-10-06 MED ORDER — SODIUM CHLORIDE 0.9 % IV SOLN
INTRAVENOUS | Status: DC
  Administered 2017-10-06 (×2): via INTRAVENOUS

## 2017-10-06 MED ORDER — ALBUTEROL SULFATE (2.5 MG/3ML) 0.083% IN NEBU: 2.5000 mg | INHALATION_SOLUTION | RESPIRATORY_TRACT | Status: DC | PRN

## 2017-10-06 MED ORDER — TOPIRAMATE 25 MG PO TABS
25.0000 mg | ORAL_TABLET | Freq: Two times a day (BID) | ORAL | Status: DC
  Administered 2017-10-06 – 2017-10-08 (×5): 25 mg via ORAL
  Filled 2017-10-06 (×5): qty 1

## 2017-10-06 MED ORDER — ROSUVASTATIN CALCIUM 10 MG PO TABS
10.0000 mg | ORAL_TABLET | Freq: Every day | ORAL | Status: DC
  Administered 2017-10-08: 10 mg via ORAL
  Filled 2017-10-06 (×3): qty 1

## 2017-10-06 MED ORDER — MORPHINE SULFATE (PF) 2 MG/ML IV SOLN: 2.0000 mg | INTRAVENOUS | Status: DC | PRN

## 2017-10-06 MED ORDER — ESCITALOPRAM OXALATE 10 MG PO TABS
10.0000 mg | ORAL_TABLET | Freq: Every day | ORAL | Status: DC
  Administered 2017-10-06 – 2017-10-08 (×3): 10 mg via ORAL
  Filled 2017-10-06 (×3): qty 1

## 2017-10-06 MED ORDER — PANTOPRAZOLE SODIUM 40 MG PO TBEC
40.0000 mg | DELAYED_RELEASE_TABLET | Freq: Every day | ORAL | Status: DC
  Administered 2017-10-08: 40 mg via ORAL
  Filled 2017-10-06 (×3): qty 1

## 2017-10-06 MED ORDER — METHYLPHENIDATE HCL 5 MG PO TABS
5.0000 mg | ORAL_TABLET | Freq: Two times a day (BID) | ORAL | Status: DC
  Administered 2017-10-06 – 2017-10-08 (×5): 5 mg via ORAL
  Filled 2017-10-06 (×6): qty 1

## 2017-10-06 MED ORDER — ZOLPIDEM TARTRATE 5 MG PO TABS: 5.0000 mg | ORAL_TABLET | Freq: Every evening | ORAL | Status: DC | PRN

## 2017-10-06 NOTE — Progress Notes (Signed)
ANTICOAGULATION CONSULT NOTE - Follow Up Consult  Pharmacy Consult for heparin Indication: PE/DVT  Allergies  Allergen Reactions  . Blood-Group Specific Substance Other (See Comments)    PT [facility]IT[facility] DOES NOT ACCEPT BLOOD PRODUCTS  . Lipitor [Atorvastatin Calcium] Other (See Comments)    myalgia    Patient Measurements: Height: 5\' 11"  (180.3 cm) Weight: 188 lb 14.9 oz (85.7 kg) IBW/kg (Calculated) : 75.3  Vital Signs: Temp: 98.1 F (36.7 C) (03/23 0439) Temp Source: Oral (03/23 0439) BP: 126/91 (03/23 1007) Pulse Rate: 97 (03/23 0439)  Labs: Recent Labs    10/05/17 1855 10/06/17 0358 10/06/17 0953  HGB 14.2 11.5*  --   HCT 41.8 35.6*  --   PLT 338 289  --   APTT 27  --   --   LABPROT 13.4  --   --   INR 1.03  --   --   HEPARINUNFRC  --  0.49 0.57  CREATININE 1.00 0.81  --   TROPONINI <0.03  --   --     Estimated Creatinine Clearance: 130.4 mL/min (by C-G formula based on SCr of 0.81 mg/dL).   Medical History: Past Medical History:  Diagnosis Date  . Anxiety   . Asthma    MILD AS A CHILD  . Depression   . Deviated septum   . Dyspnea    NORMAL SPIROMETRY 02/01/17 VISIT WITH DR HEDRICK  . Hypertension   . Sleep apnea   . Stroke [facility](HCC)     Assessment: 42yo male w/ recent h/o CVA and craniotomy c/o swelling and redness to LLE >> [facility], started on heparin and [facility].  Initial heparin level came back therapeutic at 0.49, confirmatory repeat level came back within goal range at 0.57, on 1500 units/hr. Hgb down slightly 14.2>11.5, plts WNL. No signs/symptoms of bleeding. No infusion issues per nursing.  Goal of Therapy:  Heparin level 0.3-0.7 units/ml Monitor platelets by anticoagulation protocol: Yes   Plan:  Continue heparin 1500 units/hr  Monitor daily heparin levels and CBC Follow along for plans for oral anticoagulation  Girard CooterKimberly Perkins, PharmD Clinical Pharmacist  Pager:  [phone] Clinical Phone for 10/06/2017 until 3:30pm: [phone] If after 3:30pm, please call main pharmacy at [phone]  10/06/2017,10:28 AM

## 2017-10-06 NOTE — Progress Notes (Signed)
Seen and reviewed at bedside. Clinical database including H&P this morning reviewed. [place] at bedside.  Questions answered satisfactorily.  Start cardiac healthy diet.   Agree with plan of care

## 2017-10-06 NOTE — Progress Notes (Addendum)
ANTICOAGULATION CONSULT NOTE - Initial Consult  Pharmacy Consult for heparin Indication: PE/DVT  Allergies  Allergen Reactions  . Blood-Group Specific Substance Other (See Comments)    PT [facility] DOES NOT ACCEPT BLOOD PRODUCTS  . Lipitor [Atorvastatin Calcium] Other (See Comments)    myalgia    Patient Measurements: Height: 5\' 11"  (180.3 cm) Weight: 188 lb 15 oz (85.7 kg) IBW/kg (Calculated) : 75.3  Vital Signs: Temp: 98.3 F (36.8 C) (03/22 2332) Temp Source: Oral (03/22 2332) BP: 134/101 (03/22 2332) Pulse Rate: 97 (03/22 2332)  Labs: Recent Labs    10/05/17 1855  HGB 14.2  HCT 41.8  PLT 338  APTT 27  LABPROT 13.4  INR 1.03  CREATININE 1.00  TROPONINI <0.03    Estimated Creatinine Clearance: 105.6 mL/min (by C-G formula based on SCr of 1 mg/dL).   Medical History: Past Medical History:  Diagnosis Date  . Anxiety   . Asthma    MILD AS A CHILD  . Depression   . Deviated septum   . Dyspnea    NORMAL SPIROMETRY 02/01/17 VISIT WITH DR HEDRICK  . Hypertension   . Sleep apnea   . Stroke [facility](HCC)     Assessment: 42yo male w/ recent h/o CVA and craniotomy c/o swelling and redness to LLE >> US reveals DVT[facility], started on heparin and [facility], to continue heparin.  Goal of Therapy:  Heparin level 0.3-0.7 units/ml Monitor platelets by anticoagulation protocol: Yes   Plan:  Will continue heparin 1500 units/hr for now and monitor heparin levels and CBC.  Vernard GamblesVeronda Chase Knebel, PharmD, BCPS  10/06/2017,1:38 AM   Addendum: Initial heparin level at goal, 0.49.  Will continue heparin gtt and confirm stable with additional level.  VB 5:10 AM

## 2017-10-06 NOTE — Progress Notes (Signed)
[facility], admitting MD paged and aware of pt arrival to floor. VSS, no c/o SOB, 5/10 pain in affected leg but refuses pain medication for now. Wife and family at bedside and updated on plan of care. Oriented to room and call bell. Will continue to monitor.  Margarito LinerStephanie M Amritha Yorke, RN

## 2017-10-06 NOTE — H&P (Addendum)
History and Physical    Jeremy GashJao Whipkey UJW:[id number] DOB: 11-Oct-1975 DOA: 10/06/2017  Referring MD/NP/PA:   PCP: [name], [name], MD   Patient coming from:  The patient is coming from home.  At baseline, pt is partially dependent for most of ADL.   Chief Complaint: left leg swelling and pain  HPI: Jeremy [name] is a 42 y.o. male with medical history significant of large left MCA stroke complicated by traumatic SAH, edema, herniation, and s/p of craniotomy on 07/2017, right sided paralysis, hypertension, hyperlipidemia,depression, who presents with left leg swelling and pain.  [facility]   Pt states that he has been having left leg swelling and pain for more than 3 days, which has been progressively getting worse. He has some mild erythema in the left leg. The pain is constant, 6 out of 10 in severity, sharp, nonradiating. Patient was sent by his primary care doctor for a Doppler study today which is positive for extensive occlusive DVT extending from the left common femoral vein through the left tibial vein. He was [facility] and CTA of chest done. He was found to have small PE in RLL without CT evidence of R heart straining. Patient denies chest pain, cough or shortness of breath. No fever or chills. Patient denies nausea, vomiting, diarrhea, abdominal pain, symptoms of UTI. Patient has right-sided paralysis. EDP consulted neurologist, Dr. Amada JupiterKirkpatrick regarding the anticoagulation treatment. Dr. Amada JupiterKirkpatrick agreed to start patient with IV heparin.  ED Course: pt was found to have  WBC 6.2, INR 1.03, PTT 27, electrolytes renal function okay, temperature normal, tachycardia, oxygen saturation 98% on room air. Pt is admitted to tele bed as inpt.  Review of Systems:   General: no fevers, chills, no body weight gain, has fatigue HEENT: no blurry vision, hearing changes or sore throat Respiratory: no dyspnea, coughing, wheezing CV: no chest pain, no palpitations GI:  no nausea, vomiting, abdominal pain, diarrhea, constipation GU: no dysuria, burning on urination, increased urinary frequency, hematuria  Ext: has left leg edema and swelling Neuro: has right sided paralysis. Skin: no rash, no skin tear. MSK: No muscle spasm, no deformity, no limitation of range of movement in spin Heme: No easy bruising.  Travel history: No recent long distant travel.  Allergy:  Allergies  Allergen Reactions  . Blood-Group Specific Substance Other (See Comments)    PT [facility] DOES NOT ACCEPT BLOOD PRODUCTS  . Lipitor [Atorvastatin Calcium] Other (See Comments)    myalgia    Past Medical History:  Diagnosis Date  . Anxiety   . Asthma    MILD AS A CHILD  . Depression   . Deviated septum   . Dyspnea    NORMAL SPIROMETRY 02/01/17 VISIT WITH DR HEDRICK  . Hypertension   . Sleep apnea   . Stroke [facility](HCC)     Past Surgical History:  Procedure Laterality Date  . CRANIOTOMY Left 07/29/2017   Procedure: LEFT HEMI- CRANIECTOMY WITH PLACEMENT OF BONE FLAP IN ABDOMEN;  Surgeon: Lisbeth RenshawNundkumar, Neelesh, MD;  Location: [facility] OR;  Service: Neurosurgery;  Laterality: Left;  . IR ANGIO INTRA EXTRACRAN SEL COM CAROTID INNOMINATE UNI R MOD SED  07/28/2017  . IR ANGIO VERTEBRAL SEL VERTEBRAL UNI R MOD SED  07/28/2017  . IR CT HEAD LTD  07/28/2017  . IR PERCUTANEOUS ART THROMBECTOMY/INFUSION INTRACRANIAL INC DIAG ANGIO  07/28/2017  . NASAL SEPTOPLASTY W/ TURBINOPLASTY Bilateral 03/06/2017   Procedure: NASAL SEPTOPLASTY WITH TURBINATE REDUCTION;  Surgeon: Jenne CampusMcQueen,  Sibyl Parr, MD;  Location: [facility] ORS;  Service: ENT;  Laterality: Bilateral;  . RADIOLOGY WITH ANESTHESIA N/A 07/27/2017   Procedure: RADIOLOGY WITH ANESTHESIA;  Surgeon: Julieanne Cotton, MD;  Location: [facility] OR;  Service: Radiology;  Laterality: N/A;    Social History:  reports that he has never smoked. He has never used smokeless tobacco. He reports that he drinks alcohol. He reports that he does not use drugs.  Family  History:  Family History  Problem Relation Age of Onset  . Hypertension Father   . Hypertension Brother      Prior to Admission medications   Medication Sig Start Date End Date Taking? Authorizing Provider  amLODipine (NORVASC) 5 MG tablet Take 5 mg by mouth daily.     [provider]  aspirin 325 MG tablet Take 1 tablet (325 mg total) by mouth daily. 09/07/17   Angiulli, Mcarthur Rossetti, PA-C  escitalopram (LEXAPRO) 10 MG tablet Take 1 tablet (10 mg total) by mouth daily. 09/07/17   Angiulli, Mcarthur Rossetti, PA-C  gabapentin (NEURONTIN) 100 MG capsule Take 200 mg by mouth 3 (three) times daily.    [provider]  methylphenidate (RITALIN) 5 MG tablet Take 1 tablet (5 mg total) by mouth 2 (two) times daily with breakfast and lunch. 09/24/17   Jones Bales, NP  pantoprazole (PROTONIX) 40 MG tablet Take 1 tablet (40 mg total) by mouth daily. 09/07/17   Angiulli, Mcarthur Rossetti, PA-C  polyethylene glycol (MIRALAX / GLYCOLAX) packet Take 17 g by mouth daily. Patient not taking: Reported on 10/05/2017 09/07/17   Charlton Amor, PA-C  rosuvastatin (CRESTOR) 10 MG tablet Take 10 mg by mouth daily.    [provider]  spironolactone (ALDACTONE) 50 MG tablet Take 50 mg by mouth daily.     [provider]  topiramate (TOPAMAX) 25 MG tablet Take 1 tablet (25 mg total) by mouth 2 (two) times daily. 09/24/17   Jones Bales, NP    Physical Exam: Vitals:   10/06/17 0045 10/06/17 0050  BP: (!) 143/104   Resp: 19   Temp: 97.8 F (36.6 C)   TempSrc: Oral   SpO2: 100%   Weight: 78.4 kg (172 lb 13.5 oz) 85.7 kg (188 lb 15 oz)  Height: 5\' 11"  (1.803 m) 5\' 11"  (1.803 m)   General: Not in acute distress. Wearing helmet HEENT:       Eyes: PERRL, EOMI, no scleral icterus.       ENT: No discharge from the ears and nose, no pharynx injection, no tonsillar enlargement.        Neck: No JVD, no bruit, no mass felt. Heme: No neck lymph node enlargement. Cardiac: S1/S2, RRR, No  murmurs, No gallops or rubs. Respiratory: No rales, wheezing, rhonchi or rubs. GI: Soft, nondistended, nontender, no rebound pain, no organomegaly, BS present. GU: No hematuria Ext:  2+DP/PT pulse bilaterally. Has swelling, tenderness and mild erythema in left leg from lower leg to lower thigh. Musculoskeletal: No joint deformities, No joint redness or warmth, no limitation of ROM in spin. Skin: No rashes.  Neuro: Alert, oriented X3, cranial nerves II-XII grossly intact, has right sided paralysis. sation to light touch intact. Brachial reflex 2+ bilaterally. Knee reflex 1+ bilaterally. Negative Babinski's sign. Normal finger to nose test. Psych: Patient is not psychotic, no suicidal or hemocidal ideation.  Labs on Admission: I have personally reviewed following labs and imaging studies  CBC: Recent Labs  Lab 10/05/17 1855  WBC 6.5  HGB 14.2  HCT 41.8  MCV 82.6  PLT 338   Basic Metabolic Panel: Recent Labs  Lab 10/05/17 1855  NA 139  K 3.7  CL 104  CO2 27  GLUCOSE 105*  BUN 16  CREATININE 1.00  CALCIUM 9.4   GFR: Estimated Creatinine Clearance: 105.6 mL/min (by C-G formula based on SCr of 1 mg/dL). Liver Function Tests: No results for input(s): AST, ALT, ALKPHOS, BILITOT, PROT, ALBUMIN in the last 168 hours. No results for input(s): LIPASE, AMYLASE in the last 168 hours. No results for input(s): AMMONIA in the last 168 hours. Coagulation Profile: Recent Labs  Lab 10/05/17 1855  INR 1.03   Cardiac Enzymes: Recent Labs  Lab 10/05/17 1855  TROPONINI <0.03   BNP (last 3 results) No results for input(s): PROBNP in the last 8760 hours. HbA1C: No results for input(s): HGBA1C in the last 72 hours. CBG: No results for input(s): GLUCAP in the last 168 hours. Lipid Profile: No results for input(s): CHOL, HDL, LDLCALC, TRIG, CHOLHDL, LDLDIRECT in the last 72 hours. Thyroid Function Tests: No results for input(s): TSH, T4TOTAL, FREET4, T3FREE, THYROIDAB in the last 72  hours. Anemia Panel: No results for input(s): VITAMINB12, FOLATE, FERRITIN, TIBC, IRON, RETICCTPCT in the last 72 hours. Urine analysis:    Component Value Date/Time   COLORURINE YELLOW 08/29/2017 1918   APPEARANCEUR CLOUDY (A) 08/29/2017 1918   LABSPEC 1.019 08/29/2017 1918   PHURINE 6.0 08/29/2017 1918   GLUCOSEU NEGATIVE 08/29/2017 1918   HGBUR SMALL (A) 08/29/2017 1918   BILIRUBINUR NEGATIVE 08/29/2017 1918   KETONESUR NEGATIVE 08/29/2017 1918   PROTEINUR NEGATIVE 08/29/2017 1918   NITRITE NEGATIVE 08/29/2017 1918   LEUKOCYTESUR LARGE (A) 08/29/2017 1918   Sepsis Labs: @LABRCNTIP (procalcitonin:4,lacticidven:4) )No results found for this or any previous visit (from the past 240 hour(s)).   Radiological Exams on Admission: Ct Angio Chest Pe W And/or Wo Contrast  Result Date: 10/05/2017 CLINICAL DATA:  Deep venous thrombosis with prior CVA in January with right-sided paralysis. Patient is not on blood thinners currently except for aspirin. EXAM: CT ANGIOGRAPHY CHEST WITH CONTRAST TECHNIQUE: Multidetector CT imaging of the chest was performed using the standard protocol during bolus administration of intravenous contrast. Multiplanar CT image reconstructions and MIPs were obtained to evaluate the vascular anatomy. CONTRAST:  75mL ISOVUE-370 IOPAMIDOL (ISOVUE-370) INJECTION 76% COMPARISON:  CXR 08/03/2017 FINDINGS: Cardiovascular: Acute nonocclusive pulmonary emboli to the right lower lobe segmental branches. No heart strain. No aortic aneurysm or dissection. Heart is normal in size. No calcific coronary arteriosclerosis. Mediastinum/Nodes: No enlarged mediastinal, hilar, or axillary lymph nodes. Thyroid gland, trachea, and esophagus demonstrate no significant findings apart from some mild bilateral thyromegaly. Lungs/Pleura: Lungs are clear. No pleural effusion or pneumothorax. Upper Abdomen: Small hiatal hernia. No acute abnormality in the upper abdomen. Musculoskeletal: No chest wall  abnormality. No acute or significant osseous findings. Review of the MIP images confirms the above findings. IMPRESSION: Small right lower lobe nonocclusive pulmonary emboli without right heart strain. No active pulmonary disease. These results were called by telephone at the time of interpretation on 10/05/2017 at 9:11 pm to Dr. Nita Sickle , who verbally acknowledged these results. Electronically Signed   By: Tollie Eth M.D.   On: 10/05/2017 21:11   US Venous Img Lower Unilateral Left  Result Date: 10/05/2017 CLINICAL DATA:  Left lower extremity pain and edema for the past 8 days. Evaluate for DVT. EXAM: LEFT LOWER EXTREMITY VENOUS DOPPLER ULTRASOUND TECHNIQUE: Gray-scale sonography with graded compression, as well as color Doppler  and duplex ultrasound were performed to evaluate the lower extremity deep venous systems from the level of the common femoral vein and including the common femoral, femoral, profunda femoral, popliteal and calf veins including the posterior tibial, peroneal and gastrocnemius veins when visible. The superficial great saphenous vein was also interrogated. Spectral Doppler was utilized to evaluate flow at rest and with distal augmentation maneuvers in the common femoral, femoral and popliteal veins. COMPARISON:  None. FINDINGS: Contralateral Common Femoral Vein: Respiratory phasicity is normal and symmetric with the symptomatic side. No evidence of thrombus. Normal compressibility. There is hypoechoic occlusive thrombus within the left common femoral vein (image 7), extending to involve the saphenofemoral junction (image 10), the deep femoral vein (image 13), the proximal (image 16), mid (image 19) and distal (image 22) aspects of the left femoral vein as well as the proximal left popliteal vein (image 25). Nonocclusive DVT is seen within distal aspect of the left popliteal vein (image 29) extending to involve the left posterior tibial vein. Occlusive DVT is also seen within the  left peroneal veins. Other Findings:  None. IMPRESSION: Examination is positive for extensive, predominantly occlusive, DVT extending from the left common femoral vein through the left tibial veins. These results will be called to the ordering clinician or representative by the Radiologist Assistant, and communication documented in the PACS or zVision Dashboard. Electronically Signed   By: Simonne Come M.D.   On: 10/05/2017 17:20     EKG: Independently reviewed. Sinus rhythm, QTC 469, anteroseptal infarction pattern, nonspecific T-wave change.   Assessment/Plan Principal Problem:   PE (pulmonary thromboembolism) (HCC) Active Problems:   Stroke (cerebrum) (HCC)   Hyperlipidemia   S/P craniotomy   Adjustment disorder with mixed anxiety and depressed mood   Spastic hemiplegia of right dominant side as late effect of cerebral infarction (HCC)   DVT (deep vein thrombosis) in pregnancy (HCC)   PE and extensive DVT of left leg: No chest pain, shortness breath, oxygen saturation desaturation. Currently hemodynamically stable. Pt has a small PE, without CT evidence of R heart strain, will not need 2D Echo.  -admit to tele bed as inpt -heparin drip initiated -check BNP -pain control: When necessary Percocet and morphine -may need to consult VVS  Hx of Stroke with spastic hemiplegia of right dominant side as late effect of cerebral infarction (HCC): -on ASA and crestor  HLD: -crestor  S/P craniotomy: -on protecting helmet  Adjustment disorder with mixed anxiety and depressed mood -Lexapro and Topamax  HTN:  -Continue home medications: Amlodipine -hold and spironolactone while pt is NPO -IV hydralazine prn  GERD: -Protonix  Addendum: BMP in AM showed K=3.1 -will give 40 mEQ of KCl -check Mg  DVT ppx: on IV Heparin     Code Status: Full code Family Communication:  Yes, patient's wife at bed side Disposition Plan:  Anticipate discharge back to previous home environment Consults  called:  none Admission status: Inpatient/tele    Date of Service 10/06/2017    Lorretta Harp [facility] Pager [phone]  If 7PM-7AM, please contact night-coverage [email or web address] Password [facility] 10/06/2017, 2:59 AM

## 2017-10-07 DIAGNOSIS — I82409 Acute embolism and thrombosis of unspecified deep veins of unspecified lower extremity: Secondary | ICD-10-CM

## 2017-10-07 DIAGNOSIS — I82492 Acute embolism and thrombosis of other specified deep vein of left lower extremity: Secondary | ICD-10-CM

## 2017-10-07 DIAGNOSIS — I2699 Other pulmonary embolism without acute cor pulmonale: Secondary | ICD-10-CM

## 2017-10-07 LAB — BASIC METABOLIC PANEL
ANION GAP: 10 (ref 5–15)
BUN: 13 mg/dL (ref 6–20)
CO2: 22 mmol/L (ref 22–32)
Calcium: 9.2 mg/dL (ref 8.9–10.3)
Chloride: 103 mmol/L (ref 101–111)
Creatinine, Ser: 0.86 mg/dL (ref 0.61–1.24)
GLUCOSE: 105 mg/dL — AB (ref 65–99)
POTASSIUM: 3.9 mmol/L (ref 3.5–5.1)
Sodium: 135 mmol/L (ref 135–145)

## 2017-10-07 LAB — CBC
HCT: 37.7 % — ABNORMAL LOW (ref 39.0–52.0)
HEMOGLOBIN: 12 g/dL — AB (ref 13.0–17.0)
MCH: 27.3 pg (ref 26.0–34.0)
MCHC: 31.8 g/dL (ref 30.0–36.0)
MCV: 85.7 fL (ref 78.0–100.0)
Platelets: 327 10*3/uL (ref 150–400)
RBC: 4.4 MIL/uL (ref 4.22–5.81)
RDW: 13.6 % (ref 11.5–15.5)
WBC: 5.9 10*3/uL (ref 4.0–10.5)

## 2017-10-07 LAB — GLUCOSE, CAPILLARY: GLUCOSE-CAPILLARY: 108 mg/dL — AB (ref 65–99)

## 2017-10-07 LAB — HEPARIN LEVEL (UNFRACTIONATED): Heparin Unfractionated: 0.56 IU/mL (ref 0.30–0.70)

## 2017-10-07 MED ORDER — SPIRONOLACTONE 25 MG PO TABS
50.0000 mg | ORAL_TABLET | Freq: Every day | ORAL | Status: DC
  Administered 2017-10-07 – 2017-10-08 (×2): 50 mg via ORAL
  Filled 2017-10-07 (×2): qty 2

## 2017-10-07 NOTE — Consult Note (Signed)
Patient name: Jeremy [name] MRN: [id number] DOB: Aug 03, 1975 Sex: male  REASON FOR CONSULT: left leg DVT  HPI: Jeremy [name] is a 42 y.o. male with a one week history of left leg swelling.  He was transferred from [facility] with DVT and PE for further management.  He denies prior history of DVT.  He is fairly sedentary.  He has a right hemiplegia secondary to stroke and subsequent intracranial hemorrhage January of this year.  He denies shortness of breath currently.  He states the leg swelling has improved some.  He is not really ambulatory but does use his left leg to transfer.  No family history of hypercoagulable state.  Other medical problems include asthma and hypertension which have been stable.   Past Medical History:  Diagnosis Date  . Anxiety   . Asthma    MILD AS A CHILD  . Depression   . Deviated septum   . Dyspnea    NORMAL SPIROMETRY 02/01/17 VISIT WITH DR HEDRICK  . Hypertension   . Sleep apnea   . Stroke [facility]    Past Surgical History:  Procedure Laterality Date  . CRANIOTOMY Left 07/29/2017   Procedure: LEFT HEMI- CRANIECTOMY WITH PLACEMENT OF BONE FLAP IN ABDOMEN;  Surgeon: Lisbeth Renshaw, MD;  Location: [facility] OR;  Service: Neurosurgery;  Laterality: Left;  . IR ANGIO INTRA EXTRACRAN SEL COM CAROTID INNOMINATE UNI R MOD SED  07/28/2017  . IR ANGIO VERTEBRAL SEL VERTEBRAL UNI R MOD SED  07/28/2017  . IR CT HEAD LTD  07/28/2017  . IR PERCUTANEOUS ART THROMBECTOMY/INFUSION INTRACRANIAL INC DIAG ANGIO  07/28/2017  . NASAL SEPTOPLASTY W/ TURBINOPLASTY Bilateral 03/06/2017   Procedure: NASAL SEPTOPLASTY WITH TURBINATE REDUCTION;  Surgeon: Linus Salmons, MD;  Location: [facility] ORS;  Service: ENT;  Laterality: Bilateral;  . RADIOLOGY WITH ANESTHESIA N/A 07/27/2017   Procedure: RADIOLOGY WITH ANESTHESIA;  Surgeon: Julieanne Cotton, MD;  Location: [facility] OR;  Service: Radiology;  Laterality: N/A;    Family History  Problem Relation Age of Onset  . Hypertension Father   . Hypertension  Brother     SOCIAL HISTORY: Social History   Socioeconomic History  . Marital status: Married    Spouse name: Not on file  . Number of children: Not on file  . Years of education: Not on file  . Highest education level: Not on file  Occupational History  . Not on file  Social Needs  . Financial resource strain: Not on file  . Food insecurity:    Worry: Not on file    Inability: Not on file  . Transportation needs:    Medical: Not on file    Non-medical: Not on file  Tobacco Use  . Smoking status: Never Smoker  . Smokeless tobacco: Never Used  Substance and Sexual Activity  . Alcohol use: Yes    Comment: RARE  . Drug use: No  . Sexual activity: Not on file  Lifestyle  . Physical activity:    Days per week: Not on file    Minutes per session: Not on file  . Stress: Not on file  Relationships  . Social connections:    Talks on phone: Not on file    Gets together: Not on file    Attends religious service: Not on file    Active member of club or organization: Not on file    Attends meetings of clubs or organizations: Not on file    Relationship status: Not on file  .  Intimate partner violence:    Fear of current or ex partner: Not on file    Emotionally abused: Not on file    Physically abused: Not on file    Forced sexual activity: Not on file  Other Topics Concern  . Not on file  Social History Narrative  . Not on file    Allergies  Allergen Reactions  . Blood-Group Specific Substance Other (See Comments)    PT [facility] DOES NOT ACCEPT BLOOD PRODUCTS  . Lipitor [Atorvastatin Calcium] Other (See Comments)    myalgia    Current Facility-Administered Medications  Medication Dose Route Frequency Provider Last Rate Last Dose  . acetaminophen (TYLENOL) tablet 650 mg  650 mg Oral Q6H PRN Lorretta HarpNiu, Xilin, MD       Or  . acetaminophen (TYLENOL) suppository 650 mg  650 mg Rectal Q6H PRN Lorretta HarpNiu, Xilin, MD      . albuterol (PROVENTIL) (2.5 MG/3ML) 0.083% nebulizer  solution 2.5 mg  2.5 mg Nebulization Q4H PRN Lorretta HarpNiu, Xilin, MD      . amLODipine (NORVASC) tablet 5 mg  5 mg Oral Daily Lorretta HarpNiu, Xilin, MD   5 mg at 10/07/17 1010  . aspirin tablet 325 mg  325 mg Oral Daily Lorretta HarpNiu, Xilin, MD   325 mg at 10/07/17 1009  . dextromethorphan-guaiFENesin (MUCINEX DM) 30-600 MG per 12 hr tablet 1 tablet  1 tablet Oral BID PRN Lorretta HarpNiu, Xilin, MD      . escitalopram (LEXAPRO) tablet 10 mg  10 mg Oral Daily Lorretta HarpNiu, Xilin, MD   10 mg at 10/07/17 1009  . gabapentin (NEURONTIN) capsule 200 mg  200 mg Oral TID Lorretta HarpNiu, Xilin, MD   200 mg at 10/07/17 1513  . heparin ADULT infusion 100 units/mL (25000 units/24950mL sodium chloride 0.45%)  1,500 Units/hr Intravenous Continuous Lorretta HarpNiu, Xilin, MD 15 mL/hr at 10/07/17 0757 1,500 Units/hr at 10/07/17 0757  . hydrALAZINE (APRESOLINE) injection 5 mg  5 mg Intravenous Q2H PRN Lorretta HarpNiu, Xilin, MD      . methylphenidate (RITALIN) tablet 5 mg  5 mg Oral BID WC Lorretta HarpNiu, Xilin, MD   5 mg at 10/07/17 1230  . morphine 2 MG/ML injection 2 mg  2 mg Intravenous Q4H PRN Lorretta HarpNiu, Xilin, MD      . ondansetron [facility](ZOFRAN) tablet 4 mg  4 mg Oral Q6H PRN Lorretta HarpNiu, Xilin, MD       Or  . ondansetron (ZOFRAN) injection 4 mg  4 mg Intravenous Q6H PRN Lorretta HarpNiu, Xilin, MD      . oxyCODONE-acetaminophen (PERCOCET/ROXICET) 5-325 MG per tablet 1 tablet  1 tablet Oral Q4H PRN Lorretta HarpNiu, Xilin, MD   1 tablet at 10/06/17 2121  . pantoprazole (PROTONIX) EC tablet 40 mg  40 mg Oral Daily Lorretta HarpNiu, Xilin, MD      . polyethylene glycol (MIRALAX / GLYCOLAX) packet 17 g  17 g Oral Daily PRN Lorretta HarpNiu, Xilin, MD      . rosuvastatin (CRESTOR) tablet 10 mg  10 mg Oral Daily Lorretta HarpNiu, Xilin, MD      . spironolactone (ALDACTONE) tablet 50 mg  50 mg Oral Daily Maxie BarbBhandari, Dron Prasad, MD   50 mg at 10/07/17 1500  . topiramate (TOPAMAX) tablet 25 mg  25 mg Oral BID Lorretta HarpNiu, Xilin, MD   25 mg at 10/07/17 1010  . zolpidem (AMBIEN) tablet 5 mg  5 mg Oral QHS PRN Lorretta HarpNiu, Xilin, MD        ROS:   General:  No weight loss, Fever, chills  HEENT: No recent  headaches, no nasal bleeding, no visual changes, no sore throat  Neurologic: No dizziness, blackouts, seizures. No recent symptoms of stroke or mini- stroke. No recent episodes of slurred speech, or temporary blindness.  Cardiac: No recent episodes of chest pain/pressure, no shortness of breath at rest.  No shortness of breath with exertion.  Denies history of atrial fibrillation or irregular heartbeat  Vascular: No history of rest pain in feet.  No history of claudication.  No history of non-healing ulcer, No history of DVT   Pulmonary: No home oxygen, no productive cough, no hemoptysis,  No asthma or wheezing  Musculoskeletal:  [ ]  Arthritis, [ ]  Low back pain,  [ ]  Joint pain  Hematologic:No history of hypercoagulable state.  No history of easy bleeding.  No history of anemia  Gastrointestinal: No hematochezia or melena,  No gastroesophageal reflux, no trouble swallowing  Urinary: [ ]  chronic Kidney disease, [ ]  on HD - [ ]  MWF or [ ]  TTHS, [ ]  Burning with urination, [ ]  Frequent urination, [ ]  Difficulty urinating;   Skin: No rashes  Psychological: No history of anxiety,  No history of depression   Physical Examination  Vitals:   10/06/17 2135 10/07/17 0617 10/07/17 0754 10/07/17 1515  BP: (!) 129/92 (!) 138/103 (!) 139/109 130/90  Pulse: 96 78 73 84  Resp: 20 13 13 16   Temp: 98.7 F (37.1 C) 98.7 F (37.1 C) 98.6 F (37 C) 98.6 F (37 C)  TempSrc: Oral Oral Oral Oral  SpO2: 98% 98% 100% 100%  Weight:      Height:        Body mass index is 26.35 kg/m.  General:  Alert and oriented, no acute distress HEENT: Normal Cardiac: Pulse Regular Rate and Rhythm  Abdomen: Soft, non-tender, non-distended, right upper abdomen skull flap Skin: No rash Extremity Pulses:  2+ radial, brachial, femoral, dorsalis pedis, posterior tibial pulses bilaterally Musculoskeletal: No deformity right leg edema but not tense about 25% larger than left, foot is warm bilaterally  Neurologic:  Upper and lower extremity motor 5/5 left side, right side flaccid paralysis  DATA:  Duplex DVT entire left leg CT chest right lower lobe PE non occlusive with no right heart strain  ASSESSMENT:  DVT left leg, minimal symptoms at this point.  Pt very high risk for thrombolysis with prior intracranial bleed.  He is also Jehovah's witness so any bleeding complication could be even more risk.   PLAN:  Standard treatment for DVT with IV heparin and transition to oral anticoagulation.  He should have at least 3 months with consideration of 6 months in light of PE.  Family asked questions about hypercoagulable testing.  I would defer this until after he has completed anticoagulation therapy as this would skew results.  This can be ordered by his primary care physician after completing anticoagulation therapy   Fabienne Bruns, MD [facility] Office: [phone] Pager: [phone]

## 2017-10-07 NOTE — Progress Notes (Signed)
ANTICOAGULATION CONSULT NOTE - Follow Up Consult  Pharmacy Consult for heparin Indication: PE/DVT  Allergies  Allergen Reactions  . Blood-Group Specific Substance Other (See Comments)    PT JEHOVA[facility] WITNESS DOES NOT ACCEPT BLOOD PRODUCTS  . Lipitor [Atorvastatin Calcium] Other (See Comments)    myalgia    Patient Measurements: Height: 5\' 11"  (180.3 cm) Weight: 188 lb 14.9 oz (85.7 kg) IBW/kg (Calculated) : 75.3  Vital Signs: Temp: 98.6 F (37 C) (03/24 0754) Temp Source: Oral (03/24 0754) BP: 139/109 (03/24 0754) Pulse Rate: 73 (03/24 0754)  Labs: Recent Labs    10/05/17 1855 10/06/17 0358 10/06/17 0953 10/07/17 0310 10/07/17 0836  HGB 14.2 11.5*  --  12.0*  --   HCT 41.8 35.6*  --  37.7*  --   PLT 338 289  --  327  --   APTT 27  --   --   --   --   LABPROT 13.4  --   --   --   --   INR 1.03  --   --   --   --   HEPARINUNFRC  --  0.49 0.57 0.56  --   CREATININE 1.00 0.81  --   --  0.86  TROPONINI <0.03  --   --   --   --     Estimated Creatinine Clearance: 122.8 mL/min (by C-G formula based on SCr of 0.86 mg/dL).   Medical History: Past Medical History:  Diagnosis Date  . Anxiety   . Asthma    MILD AS A CHILD  . Depression   . Deviated septum   . Dyspnea    NORMAL SPIROMETRY 02/01/17 VISIT WITH DR HEDRICK  . Hypertension   . Sleep apnea   . Stroke [facility](HCC)     Assessment: 42yo male w/ recent h/o CVA and craniotomy c/o swelling and redness to LLE >> US reveals DVT, [facility], started on heparin and [facility], to continue heparin.  Heparin level remains therapeutic at 0.56, on 1500 units/hr. Hgb 12, plts WNL. No signs/symptoms of bleeding. No infusion issues per nursing.  Goal of Therapy:  Heparin level 0.3-0.7 units/ml Monitor platelets by anticoagulation protocol: Yes   Plan:  Continue heparin 1500 units/hr  Monitor daily heparin levels and CBC Follow along for plans for oral anticoagulation  Girard CooterKimberly Perkins, PharmD Clinical  Pharmacist  Pager: [phone] Clinical Phone for 10/07/2017 until 3:30pm: [phone] If after 3:30pm, please call main pharmacy at [phone]  10/07/2017,10:29 AM

## 2017-10-07 NOTE — Progress Notes (Signed)
PROGRESS NOTE    Jeremy GashJao Getty  ZOX:[id number] DOB: Jul 07, 1976 DOA: 10/06/2017 PCP: [name], [name], MD   Brief Narrative: 42 year old male with history of large leftMCA stroke complicated by traumatic SAH, edema, herniation, ands/p of craniotomy on 07/2017, right sided paralysis, hypertension, hyperlipidemia,depression, who presents with left leg swelling and pain. [facility]   Assessment & Plan:   #Extensive left lower leg DVT with a small PE: Patient does not have chest pain, shortness of breath or hypoxia.  CT scan showed small right lung PE without right heart strain.  Doppler lower extremity with extensive left leg DVT.  Currently on IV heparin.  Vascular consult requested, discussed with Dr. Darrick PennaFields.  Continue to monitor clinically. -As per H&P, it was discussed with Dr. Amada JupiterKirkpatrick from neurologist regarding anticoagulation and agreed to start.  #History of a stroke with spastic hemiplegia of the right side: Currently on aspirin and statin.  May not need aspirin if patient is on oral anticoagulation.  Will discuss with neurologist. -Status post craniotomy in the past.  #Hyperlipidemia: Continue Crestor  #History of adjustment disorder with mixed anxiety and depressed mood: Currently on Lexapro and Topamax.  #History of hypertension: Continue amlodipine.  Resume Aldactone 50 mg, home medication.  #Hypokalemia: Potassium level acceptable.  Magnesium level acceptable.  #GERD: Continue Protonix  Ordered PT, OT therapy and case manager referral for safe discharge plan.  DVT prophylaxis: IV heparin Code Status: Full code Family Communication: No family at bedside Disposition Plan: Currently admitted.  Likely discharge home in 1-2 days    Consultants:   Vascular surgery  Procedures: None Antimicrobials: None  Subjective: Seen and examined at bedside.  Headache, dizziness, nausea vomiting chest pain shortness of breath.  Objective: Vitals:   10/06/17  1600 10/06/17 2135 10/07/17 0617 10/07/17 0754  BP:  (!) 129/92 (!) 138/103 (!) 139/109  Pulse: 93 96 78 73  Resp: 19 20 13 13   Temp:  98.7 F (37.1 C) 98.7 F (37.1 C) 98.6 F (37 C)  TempSrc:  Oral Oral Oral  SpO2: 100% 98% 98% 100%  Weight:      Height:        Intake/Output Summary (Last 24 hours) at 10/07/2017 1227 Last data filed at 10/06/2017 2200 Gross per 24 hour  Intake 2010 ml  Output -  Net 2010 ml   Filed Weights   10/06/17 0045 10/06/17 0050 10/06/17 0320  Weight: 78.4 kg (172 lb 13.5 oz) 85.7 kg (188 lb 15 oz) 85.7 kg (188 lb 14.9 oz)    Examination:  General exam: Appears calm and comfortable  Respiratory system: Clear to auscultation. Respiratory effort normal. No wheezing or crackle Cardiovascular system: S1 & S2 heard, RRR.  No pedal edema. Gastrointestinal system: Abdomen is nondistended, soft and nontender. Normal bowel sounds heard. Central nervous system: Alert and oriented. No focal neurological deficits. Extremities: right sided weakness Skin: No rashes, lesions or ulcers Psychiatry: Judgement and insight appear normal. Mood & affect appropriate.     Data Reviewed: I have personally reviewed following labs and imaging studies  CBC: Recent Labs  Lab 10/05/17 1855 10/06/17 0358 10/07/17 0310  WBC 6.5 5.2 5.9  HGB 14.2 11.5* 12.0*  HCT 41.8 35.6* 37.7*  MCV 82.6 85.8 85.7  PLT 338 289 327   Basic Metabolic Panel: Recent Labs  Lab 10/05/17 1855 10/06/17 0358 10/06/17 0630 10/07/17 0836  NA 139 135  --  135  K 3.7 3.1*  --  3.9  CL 104 101  --  103  CO2 27 24  --  22  GLUCOSE 105* 171*  --  105*  BUN 16 11  --  13  CREATININE 1.00 0.81  --  0.86  CALCIUM 9.4 8.4*  --  9.2  MG  --   --  2.0  --    GFR: Estimated Creatinine Clearance: 122.8 mL/min (by C-G formula based on SCr of 0.86 mg/dL). Liver Function Tests: No results for input(s): AST, ALT, ALKPHOS, BILITOT, PROT, ALBUMIN in the last 168 hours. No results for input(s):  LIPASE, AMYLASE in the last 168 hours. No results for input(s): AMMONIA in the last 168 hours. Coagulation Profile: Recent Labs  Lab 10/05/17 1855  INR 1.03   Cardiac Enzymes: Recent Labs  Lab 10/05/17 1855  TROPONINI <0.03   BNP (last 3 results) No results for input(s): PROBNP in the last 8760 hours. HbA1C: No results for input(s): HGBA1C in the last 72 hours. CBG: Recent Labs  Lab 10/06/17 0556 10/07/17 0614  GLUCAP 106* 108*   Lipid Profile: No results for input(s): CHOL, HDL, LDLCALC, TRIG, CHOLHDL, LDLDIRECT in the last 72 hours. Thyroid Function Tests: No results for input(s): TSH, T4TOTAL, FREET4, T3FREE, THYROIDAB in the last 72 hours. Anemia Panel: No results for input(s): VITAMINB12, FOLATE, FERRITIN, TIBC, IRON, RETICCTPCT in the last 72 hours. Sepsis Labs: No results for input(s): PROCALCITON, LATICACIDVEN in the last 168 hours.  No results found for this or any previous visit (from the past 240 hour(s)).       Radiology Studies: Ct Angio Chest Pe W And/or Wo Contrast  Result Date: 10/05/2017 CLINICAL DATA:  Deep venous thrombosis with prior CVA in January with right-sided paralysis. Patient is not on blood thinners currently except for aspirin. EXAM: CT ANGIOGRAPHY CHEST WITH CONTRAST TECHNIQUE: Multidetector CT imaging of the chest was performed using the standard protocol during bolus administration of intravenous contrast. Multiplanar CT image reconstructions and MIPs were obtained to evaluate the vascular anatomy. CONTRAST:  75mL ISOVUE-370 IOPAMIDOL (ISOVUE-370) INJECTION 76% COMPARISON:  CXR 08/03/2017 FINDINGS: Cardiovascular: Acute nonocclusive pulmonary emboli to the right lower lobe segmental branches. No heart strain. No aortic aneurysm or dissection. Heart is normal in size. No calcific coronary arteriosclerosis. Mediastinum/Nodes: No enlarged mediastinal, hilar, or axillary lymph nodes. Thyroid gland, trachea, and esophagus demonstrate no  significant findings apart from some mild bilateral thyromegaly. Lungs/Pleura: Lungs are clear. No pleural effusion or pneumothorax. Upper Abdomen: Small hiatal hernia. No acute abnormality in the upper abdomen. Musculoskeletal: No chest wall abnormality. No acute or significant osseous findings. Review of the MIP images confirms the above findings. IMPRESSION: Small right lower lobe nonocclusive pulmonary emboli without right heart strain. No active pulmonary disease. These results were called by telephone at the time of interpretation on 10/05/2017 at 9:11 pm to Dr. Nita Sickle , who verbally acknowledged these results. Electronically Signed   By: Tollie Eth M.D.   On: 10/05/2017 21:11   US Venous Img Lower Unilateral Left  Result Date: 10/05/2017 CLINICAL DATA:  Left lower extremity pain and edema for the past 8 days. Evaluate for DVT. EXAM: LEFT LOWER EXTREMITY VENOUS DOPPLER ULTRASOUND TECHNIQUE: Gray-scale sonography with graded compression, as well as color Doppler and duplex ultrasound were performed to evaluate the lower extremity deep venous systems from the level of the common femoral vein and including the common femoral, femoral, profunda femoral, popliteal and calf veins including the posterior tibial, peroneal and gastrocnemius veins when visible. The superficial great saphenous vein was also interrogated. Spectral  Doppler was utilized to evaluate flow at rest and with distal augmentation maneuvers in the common femoral, femoral and popliteal veins. COMPARISON:  None. FINDINGS: Contralateral Common Femoral Vein: Respiratory phasicity is normal and symmetric with the symptomatic side. No evidence of thrombus. Normal compressibility. There is hypoechoic occlusive thrombus within the left common femoral vein (image 7), extending to involve the saphenofemoral junction (image 10), the deep femoral vein (image 13), the proximal (image 16), mid (image 19) and distal (image 22) aspects of the left  femoral vein as well as the proximal left popliteal vein (image 25). Nonocclusive DVT is seen within distal aspect of the left popliteal vein (image 29) extending to involve the left posterior tibial vein. Occlusive DVT is also seen within the left peroneal veins. Other Findings:  None. IMPRESSION: Examination is positive for extensive, predominantly occlusive, DVT extending from the left common femoral vein through the left tibial veins. These results will be called to the ordering clinician or representative by the Radiologist Assistant, and communication documented in the PACS or zVision Dashboard. Electronically Signed   By: Simonne Come M.D.   On: 10/05/2017 17:20        Scheduled Meds: . amLODipine  5 mg Oral Daily  . aspirin  325 mg Oral Daily  . escitalopram  10 mg Oral Daily  . gabapentin  200 mg Oral TID  . methylphenidate  5 mg Oral BID WC  . pantoprazole  40 mg Oral Daily  . rosuvastatin  10 mg Oral Daily  . topiramate  25 mg Oral BID   Continuous Infusions: . heparin 1,500 Units/hr (10/07/17 0757)     LOS: 1 day    Katanya Schlie Jaynie Collins, MD [facility] Pager [phone]  If 7PM-7AM, please contact night-coverage [email or web address] Password [facility] 10/07/2017, 12:27 PM

## 2017-10-08 DIAGNOSIS — I69351 Hemiplegia and hemiparesis following cerebral infarction affecting right dominant side: Secondary | ICD-10-CM

## 2017-10-08 LAB — CBC
HEMATOCRIT: 41.8 % (ref 39.0–52.0)
Hemoglobin: 13.5 g/dL (ref 13.0–17.0)
MCH: 27.2 pg (ref 26.0–34.0)
MCHC: 32.3 g/dL (ref 30.0–36.0)
MCV: 84.1 fL (ref 78.0–100.0)
Platelets: 312 10*3/uL (ref 150–400)
RBC: 4.97 MIL/uL (ref 4.22–5.81)
RDW: 13.7 % (ref 11.5–15.5)
WBC: 6.4 10*3/uL (ref 4.0–10.5)

## 2017-10-08 LAB — GLUCOSE, CAPILLARY: GLUCOSE-CAPILLARY: 106 mg/dL — AB (ref 65–99)

## 2017-10-08 LAB — HEPARIN LEVEL (UNFRACTIONATED): Heparin Unfractionated: 0.66 IU/mL (ref 0.30–0.70)

## 2017-10-08 MED ORDER — APIXABAN 5 MG PO TABS
5.0000 mg | ORAL_TABLET | Freq: Two times a day (BID) | ORAL | Status: DC
Start: 2017-10-15 — End: 2017-10-08

## 2017-10-08 MED ORDER — APIXABAN 5 MG PO TABS: 10.0000 mg | ORAL_TABLET | Freq: Two times a day (BID) | ORAL | Status: DC

## 2017-10-08 MED ORDER — ASPIRIN EC 81 MG PO TBEC: 81.0000 mg | DELAYED_RELEASE_TABLET | Freq: Every day | ORAL | 0 refills | Status: DC

## 2017-10-08 MED ORDER — ELIQUIS 5 MG VTE STARTER PACK: ORAL_TABLET | ORAL | 0 refills | Status: DC

## 2017-10-08 MED ORDER — APIXABAN 5 MG PO TABS: 5.0000 mg | ORAL_TABLET | Freq: Two times a day (BID) | ORAL | Status: DC

## 2017-10-08 MED ORDER — APIXABAN 5 MG PO TABS
10.0000 mg | ORAL_TABLET | Freq: Two times a day (BID) | ORAL | Status: DC
  Administered 2017-10-08: 10 mg via ORAL
  Filled 2017-10-08: qty 2

## 2017-10-08 NOTE — Progress Notes (Signed)
Occupational Therapy Evaluation Patient Details Name: Jeremy [name] MRN: [id number] DOB: [birth year] Today's Date: 10/08/2017    History of Present Illness [age] year old male with history of large left MCA stroke complicated by traumatic SAH, edema, herniation, and s/p of craniotomy on 07/2017, right sided paralysis, hypertension, hyperlipidemia,depression, who presents with left leg swelling and pain. Pt with Extensive left lower leg DVT with a small PE   Clinical Impression   PTA, pt active with HHOT. Pt safe to DC home at current level with resumption of home health services. Educated pt on positioning of R foot at rest to decrease risk of developing a contracture. Pt verbalized understanding. All further OT to be addressed by HHOT.     Follow Up Recommendations  Home health OT;Supervision/Assistance - 24 hour    Equipment Recommendations  None recommended by OT    Recommendations for Other Services       Precautions / Restrictions Precautions Precautions: Fall Precaution Comments: at risk for developing contractures Restrictions Weight Bearing Restrictions: No      Mobility Bed Mobility               General bed mobility comments: OOB in chair  Transfers Overall transfer level: Needs assistance   Transfers: Sit to/from Stand Sit to Stand: Min assist              Balance Overall balance assessment: Needs assistance   Sitting balance-Leahy Scale: Good       Standing balance-Leahy Scale: Fair                             ADL either performed or assessed with clinical judgement   ADL Overall ADL's : Needs assistance/impaired                                       General ADL Comments: wife assisting with ADL tasks; currently active with HHOT to address aDL tasks; Able to tranfer with min A stand picot     Vision         Perception     Praxis      Pertinent Vitals/Pain Pain Assessment: Faces Faces Pain Scale: Hurts a  little bit Pain Location: R shoulder Pain Descriptors / Indicators: Discomfort Pain Intervention(s): Limited activity within patient's tolerance     Hand Dominance Left   Extremity/Trunk Assessment Upper Extremity Assessment Upper Extremity Assessment: RUE deficits/detail RUE Deficits / Details: R spastic hemiplegia; flexor synergy with yawn(+2 finger width inferior subluxation) RUE Coordination: decreased fine motor;decreased gross motor(nonfunctional use at this time)   Lower Extremity Assessment Lower Extremity Assessment: RLE deficits/detail RLE Deficits / Details: R foot drop; has AFO       Communication     Cognition Arousal/Alertness: Awake/alert Behavior During Therapy: Flat affect Overall Cognitive Status: No family/caregiver present to determine baseline cognitive functioning                                     General Comments       Exercises Exercises: Other exercises Other Exercises Other Exercises: Educated on positionoing of R foot at rest. Pt developing shortening of heel cord. States he had a footsplint but it does not fit properly. Educated pt on use of pillow/blanket to position R foot in dorsiflexion when OOB.  PT verbalzied understanding.  Other Exercises: Pt has resting hand splint for R hand - wears at night   Shoulder Instructions      Home Living Family/patient expects to be discharged to:: Private residence Living Arrangements: Spouse/significant other;Children Available Help at Discharge: Family;Available 24 hours/day Type of Home: Apartment Home Access: Stairs to enter     Home Layout: One level     Bathroom Shower/Tub: Chief Strategy Officer: Standard     Home Equipment: Wheelchair - [employer] - 2 wheels;Tub bench   Additional Comments: handicap accessible apt      Prior Functioning/Environment Level of Independence: Independent        Comments: pt worked with Conservation officer, historic buildings and lives with wife and 8yo  son; wife is expecting baby #2        OT Problem List: Decreased strength;Decreased range of motion;Impaired balance (sitting and/or standing);Impaired vision/perception;Decreased coordination;Decreased cognition;Decreased safety awareness;Decreased knowledge of use of DME or AE;Impaired sensation;Impaired tone;Impaired UE functional use;Pain;Increased edema      OT Treatment/Interventions:      OT Goals(Current goals can be found in the care plan section) Acute Rehab OT Goals Patient Stated Goal: to go home OT Goal Formulation: All assessment and education complete, DC therapy(continue twith HHOT)  OT Frequency:     Barriers to D/C:            Co-evaluation              AM-PAC PT "6 Clicks" Daily Activity     Outcome Measure Help from another person eating meals?: A Little Help from another person taking care of personal grooming?: A Little Help from another person toileting, which includes using toliet, bedpan, or urinal?: A Little Help from another person bathing (including washing, rinsing, drying)?: A Little Help from another person to put on and taking off regular upper body clothing?: A Little Help from another person to put on and taking off regular lower body clothing?: A Little 6 Click Score: 18   End of Session Nurse Communication: Mobility status  Activity Tolerance: Patient tolerated treatment well Patient left: in chair;with call bell/phone within reach  OT Visit Diagnosis: Other abnormalities of gait and mobility (R26.89);History of falling (Z91.81);Other symptoms and signs involving cognitive function;Hemiplegia and hemiparesis;Pain Hemiplegia - Right/Left: Right Hemiplegia - dominant/non-dominant: Non-Dominant Hemiplegia - caused by: Cerebral infarction;Nontraumatic intracerebral hemorrhage Pain - Right/Left: Right Pain - part of body: Shoulder                Time: 1610-9604 OT Time Calculation (min): 15 min Charges:  OT General Charges $OT Visit:  1 Visit OT Evaluation $OT Eval Low Complexity: 1 Low G-Codes:     Jeremy [name], OT/L  [phone] 10/08/2017  Jeremy [name],Jeremy [name] 10/08/2017, 10:59 AM

## 2017-10-08 NOTE — Progress Notes (Addendum)
ANTICOAGULATION CONSULT NOTE - Follow Up Consult  Pharmacy Consult for heparin > apixaban Indication: PE/DVT  Allergies  Allergen Reactions  . Blood-Group Specific Substance Other (See Comments)    PT JEHOVAHS WITNESS DOES NOT ACCEPT BLOOD PRODUCTS  . Lipitor [Atorvastatin Calcium] Other (See Comments)    myalgia    Patient Measurements: Height: 5\' 11"  (180.3 cm) Weight: 188 lb 14.9 oz (85.7 kg) IBW/kg (Calculated) : 75.3  Vital Signs: BP: 133/97 (03/25 0610) Pulse Rate: 95 (03/25 0610)  Labs: Recent Labs    10/05/17 1855  10/06/17 0358 10/06/17 0953 10/07/17 0310 10/07/17 0836 10/08/17 0322  HGB 14.2  --  11.5*  --  12.0*  --  13.5  HCT 41.8  --  35.6*  --  37.7*  --  41.8  PLT 338  --  289  --  327  --  312  APTT 27  --   --   --   --   --   --   LABPROT 13.4  --   --   --   --   --   --   INR 1.03  --   --   --   --   --   --   HEPARINUNFRC  --    < > 0.49 0.57 0.56  --  0.66  CREATININE 1.00  --  0.81  --   --  0.86  --   TROPONINI <0.03  --   --   --   --   --   --    < > = values in this interval not displayed.    Estimated Creatinine Clearance: 122.8 mL/min (by C-G formula based on SCr of 0.86 mg/dL).   Medical History: Past Medical History:  Diagnosis Date  . Anxiety   . Asthma    MILD AS A CHILD  . Depression   . Deviated septum   . Dyspnea    NORMAL SPIROMETRY 02/01/17 VISIT WITH DR HEDRICK  . Hypertension   . Sleep apnea   . Stroke [facility](HCC)     Assessment: 42yo male w/ recent h/o CVA and craniotomy c/o swelling and redness to LLE >> US reveals DVT, [facility], started on heparin and [facility]. Noted hx of intracranial bleed.  Pharmacy consulted to transition heparin to apixaban. CBC and SCr stable. No bleed documented.  Goal of Therapy:  VTE treatment Monitor platelets by anticoagulation protocol: Yes   Plan:  D/c heparin drip at time of 1st dose of apixaban - communicated plan with RN Apixaban 10mg  PO  BID x 7 days; then 5mg  PO BID Monitor CBC, s/sx bleeding Consider decreasing aspirin to 81mg  daily while on apixaban?  Babs BertinHaley Panhia Karl, PharmD, BCPS Clinical Pharmacist Clinical phone for 10/08/2017 until 3:30pm: [phone] If after 3:30pm, please call main pharmacy at: [phone] 10/08/2017 8:19 AM

## 2017-10-08 NOTE — Discharge Summary (Signed)
Physician Discharge Summary  Jeremy [name] ZOX:[id number] DOB: May 15, 1976 DOA: 10/06/2017  PCP: [name], MD  Admit date: 10/06/2017 Discharge date: 10/08/2017  Admitted From:home Disposition:home  Recommendations for Outpatient Follow-up:  1. Follow up with PCP in 1-2 weeks 2. Please obtain BMP/CBC in one week  Home Health:yes Equipment/Devices:continue wheelchair Discharge Condition:stable CODE STATUS:full code Diet recommendation:heart healthy  Brief/Interim Summary: 42 year old male with history of large leftMCA stroke complicated by traumatic SAH, edema, herniation, ands/p ofcraniotomy on 07/2017, right sided paralysis, hypertension, hyperlipidemia,depression, who presents with left leg swelling and pain.Pt is transfered her from [facility].  #Extensive left lower leg DVT with a small PE: - CT scan showed small right lung PE without right heart strain.  Doppler lower extremity with extensive left leg DVT.   -Treated with IV heparin and switch to oral Eliquis on discharge.  Education provided to patient.  Recommend to follow-up with PCP and undergo evaluation for hypercoagulation as an outpatient.  Patient was evaluated by vascular surgeon.  Patient does not have chest pain, shortness of breath.  He is not hypoxic.  Clinically stable. -As per H&P, it was discussed with Dr. Amada Jupiter from neurologist regarding anticoagulation and agreed to start.  #History of a stroke with spastic hemiplegia of the right side:  -Since patient is on Eliquis, the dose of aspirin reduced to 81 mg. -Status post craniotomy in the past.  #Hyperlipidemia: Continue Crestor  #History of adjustment disorder with mixed anxiety and depressed mood: Currently on Lexapro and Topamax.  #History of hypertension: Continue amlodipine.  Resume Aldactone 50 mg, home medication.  #Hypokalemia: Potassium level acceptable.  Magnesium level acceptable.  #GERD: Continue Protonix  Clinically stable.   Discharging with oral Eliquis and reduce the dose of aspirin.  Home care services ordered including PT OT and speech.  Recommended outpatient follow-up.    Discharge Diagnoses:  Principal Problem:   PE (pulmonary thromboembolism) (HCC) Active Problems:   Stroke (cerebrum) (HCC)   Hyperlipidemia   S/P craniotomy   Adjustment disorder with mixed anxiety and depressed mood   Spastic hemiplegia of right dominant side as late effect of cerebral infarction (HCC)   DVT (deep vein thrombosis) in pregnancy [facility])    Discharge Instructions  Discharge Instructions    Call MD for:  difficulty breathing, headache or visual disturbances   Complete by:  As directed    Call MD for:  extreme fatigue   Complete by:  As directed    Call MD for:  hives   Complete by:  As directed    Call MD for:  persistant dizziness or light-headedness   Complete by:  As directed    Call MD for:  persistant nausea and vomiting   Complete by:  As directed    Call MD for:  severe uncontrolled pain   Complete by:  As directed    Call MD for:  temperature >100.4   Complete by:  As directed    Diet - low sodium heart healthy   Complete by:  As directed    Discharge instructions   Complete by:  As directed    Follow-up with your PCP in 1 week.   Increase activity slowly   Complete by:  As directed      Allergies as of 10/08/2017      Reactions   Blood-group Specific Substance Other (See Comments)   PT [facility] DOES NOT ACCEPT BLOOD PRODUCTS   Lipitor [atorvastatin Calcium] Other (See Comments)   myalgia  Medication List    STOP taking these medications   aspirin 325 MG tablet Replaced by:  aspirin EC 81 MG tablet   polyethylene glycol packet Commonly known as:  MIRALAX / GLYCOLAX     TAKE these medications   amLODipine 5 MG tablet Commonly known as:  NORVASC Take 5 mg by mouth daily.   aspirin EC 81 MG tablet Take 1 tablet (81 mg total) by mouth daily. Replaces:  aspirin 325 MG  tablet   ELIQUIS STARTER PACK 5 MG Tabs Take as directed on package: start with two-5mg  tablets twice daily for 7 days. On day 8, switch to one-5mg  tablet twice daily.   escitalopram 10 MG tablet Commonly known as:  LEXAPRO Take 1 tablet (10 mg total) by mouth daily.   gabapentin 100 MG capsule Commonly known as:  NEURONTIN Take 200 mg by mouth 3 (three) times daily.   methylphenidate 5 MG tablet Commonly known as:  RITALIN Take 1 tablet (5 mg total) by mouth 2 (two) times daily with breakfast and lunch.   pantoprazole 40 MG tablet Commonly known as:  PROTONIX Take 1 tablet (40 mg total) by mouth daily.   rosuvastatin 10 MG tablet Commonly known as:  CRESTOR Take 10 mg by mouth daily.   spironolactone 50 MG tablet Commonly known as:  ALDACTONE Take 50 mg by mouth daily.   topiramate 25 MG tablet Commonly known as:  TOPAMAX Take 1 tablet (25 mg total) by mouth 2 (two) times daily.      Follow-up Information    [name], MD. Schedule an appointment as soon as possible for a visit in 1 week(s).   Specialty:  Family Medicine Contact information: [address] [facility] [place] [address] [phone]          Allergies  Allergen Reactions  . Blood-Group Specific Substance Other (See Comments)    PT [facility] DOES NOT ACCEPT BLOOD PRODUCTS  . Lipitor [Atorvastatin Calcium] Other (See Comments)    myalgia    Consultations: Vascular surgery  Procedures/Studies: CT scan  Subjective: Seen and examined at bedside.  Denied headache, dizziness, nausea vomiting chest pain shortness of breath.  Discharge Exam: Vitals:   10/07/17 1943 10/08/17 0610  BP: (!) 133/98 (!) 133/97  Pulse: 81 95  Resp: 18 16  Temp: 98.4 F (36.9 C)   SpO2: 100% 100%   Vitals:   10/07/17 0754 10/07/17 1515 10/07/17 1943 10/08/17 0610  BP: (!) 139/109 130/90 (!) 133/98 (!) 133/97  Pulse: 73 84 81 95  Resp: 13 16 18 16   Temp: 98.6 F (37 C) 98.6 F  (37 C) 98.4 F (36.9 C)   TempSrc: Oral Oral Oral   SpO2: 100% 100% 100% 100%  Weight:      Height:        General: Pt is alert, awake, not in acute distress Cardiovascular: RRR, S1/S2 +, no rubs, no gallops Respiratory: CTA bilaterally, no wheezing, no rhonchi Abdominal: Soft, NT, ND, bowel sounds + Extremities: no edema, no cyanosis Right hemiparesis.   The results of significant diagnostics from this hospitalization (including imaging, microbiology, ancillary and laboratory) are listed below for reference.     Microbiology: No results found for this or any previous visit (from the past 240 hour(s)).   Labs: BNP (last 3 results) Recent Labs    10/06/17 0358  BNP 1.7   Basic Metabolic Panel: Recent Labs  Lab 10/05/17 1855 10/06/17 0358 10/06/17 0630 10/07/17 0836  NA  139 135  --  135  K 3.7 3.1*  --  3.9  CL 104 101  --  103  CO2 27 24  --  22  GLUCOSE 105* 171*  --  105*  BUN 16 11  --  13  CREATININE 1.00 0.81  --  0.86  CALCIUM 9.4 8.4*  --  9.2  MG  --   --  2.0  --    Liver Function Tests: No results for input(s): AST, ALT, ALKPHOS, BILITOT, PROT, ALBUMIN in the last 168 hours. No results for input(s): LIPASE, AMYLASE in the last 168 hours. No results for input(s): AMMONIA in the last 168 hours. CBC: Recent Labs  Lab 10/05/17 1855 10/06/17 0358 10/07/17 0310 10/08/17 0322  WBC 6.5 5.2 5.9 6.4  HGB 14.2 11.5* 12.0* 13.5  HCT 41.8 35.6* 37.7* 41.8  MCV 82.6 85.8 85.7 84.1  PLT 338 289 327 312   Cardiac Enzymes: Recent Labs  Lab 10/05/17 1855  TROPONINI <0.03   BNP: Invalid input(s): POCBNP CBG: Recent Labs  Lab 10/06/17 0556 10/07/17 0614 10/08/17 0610  GLUCAP 106* 108* 106*   D-Dimer No results for input(s): DDIMER in the last 72 hours. Hgb A1c No results for input(s): HGBA1C in the last 72 hours. Lipid Profile No results for input(s): CHOL, HDL, LDLCALC, TRIG, CHOLHDL, LDLDIRECT in the last 72 hours. Thyroid function  studies No results for input(s): TSH, T4TOTAL, T3FREE, THYROIDAB in the last 72 hours.  Invalid input(s): FREET3 Anemia work up No results for input(s): VITAMINB12, FOLATE, FERRITIN, TIBC, IRON, RETICCTPCT in the last 72 hours. Urinalysis    Component Value Date/Time   COLORURINE YELLOW 08/29/2017 1918   APPEARANCEUR CLOUDY (A) 08/29/2017 1918   LABSPEC 1.019 08/29/2017 1918   PHURINE 6.0 08/29/2017 1918   GLUCOSEU NEGATIVE 08/29/2017 1918   HGBUR SMALL (A) 08/29/2017 1918   BILIRUBINUR NEGATIVE 08/29/2017 1918   KETONESUR NEGATIVE 08/29/2017 1918   PROTEINUR NEGATIVE 08/29/2017 1918   NITRITE NEGATIVE 08/29/2017 1918   LEUKOCYTESUR LARGE (A) 08/29/2017 1918   Sepsis Labs Invalid input(s): PROCALCITONIN,  WBC,  LACTICIDVEN Microbiology No results found for this or any previous visit (from the past 240 hour(s)).   Time coordinating discharge: 28 minutes  SIGNED:   Maxie Barbron Prasad Jahaan Vanwagner, MD  [facility] 10/08/2017, 10:30 AM  If 7PM-7AM, please contact night-coverage [email or web address] Password [email or web address]

## 2017-10-08 NOTE — Discharge Instructions (Signed)
Information on my medicine - ELIQUIS (apixaban)  This medication education was reviewed with me or my healthcare representative as part of my discharge preparation.  Why was Eliquis prescribed for you? Eliquis was prescribed to treat blood clots that may have been found in the veins of your legs (deep vein thrombosis) or in your lungs (pulmonary embolism) and to reduce the risk of them occurring again.  What do You need to know about Eliquis ? The starting dose is 10 mg (two 5 mg tablets) taken TWICE daily for the FIRST SEVEN (7) DAYS, then on (enter date)  10/15/2017  the dose is reduced to ONE 5 mg tablet taken TWICE daily.  Eliquis may be taken with or without food.   Try to take the dose about the same time in the morning and in the evening. If you have difficulty swallowing the tablet whole please discuss with your pharmacist how to take the medication safely.  Take Eliquis exactly as prescribed and DO NOT stop taking Eliquis without talking to the doctor who prescribed the medication.  Stopping may increase your risk of developing a new blood clot.  Refill your prescription before you run out.  After discharge, you should have regular check-up appointments with your healthcare provider that is prescribing your Eliquis.    What do you do if you miss a dose? If a dose of ELIQUIS is not taken at the scheduled time, take it as soon as possible on the same day and twice-daily administration should be resumed. The dose should not be doubled to make up for a missed dose.  Important Safety Information A possible side effect of Eliquis is bleeding. You should call your healthcare provider right away if you experience any of the following: ? Bleeding from an injury or your nose that does not stop. ? Unusual colored urine (red or dark brown) or unusual colored stools (red or black). ? Unusual bruising for unknown reasons. ? A serious fall or if you hit your head (even if there is no  bleeding).  Some medicines may interact with Eliquis and might increase your risk of bleeding or clotting while on Eliquis. To help avoid this, consult your healthcare provider or pharmacist prior to using any new prescription or non-prescription medications, including herbals, vitamins, non-steroidal anti-inflammatory drugs (NSAIDs) and supplements.  This website has more information on Eliquis (apixaban): http://[email or web address].com/eliquis/home

## 2017-10-08 NOTE — Evaluation (Signed)
Physical Therapy Evaluation Patient Details Name: Jeremy [name] MRN: [id number] DOB: [birth year] Today's Date: 10/08/2017   History of Present Illness  42 year old male with history of large left MCA stroke complicated by traumatic SAH, edema, herniation, and s/p of craniotomy on 07/2017, right sided paralysis, hypertension, hyperlipidemia,depression, who presents with left leg swelling and pain. Pt with Extensive left lower leg DVT with a small PE  Clinical Impression  Pt is up in chair when PT arrived and has signficant distracted personality, yet was able to focus for standing.  He is weak on RLE with no platform at eval, as well as no AFO to attempt steps.  He is quite densely weak on R hip knee and ankle, but not quite certain at what level he was just prior to going home from CIR.  Anticipate HHPT is needed to continue rehab, and should progress from there to outpatient when appropriate.    Follow Up Recommendations Home health PT;Supervision/Assistance - 24 hour;Supervision for mobility/OOB    Equipment Recommendations  None recommended by PT    Recommendations for Other Services       Precautions / Restrictions Precautions Precautions: Fall Precaution Comments: high risk of contracture to R ankle Restrictions Weight Bearing Restrictions: No      Mobility  Bed Mobility               General bed mobility comments: OOB in chair  Transfers Overall transfer level: Needs assistance   Transfers: Sit to/from Stand Sit to Stand: Min assist         General transfer comment: cannot step due to RLE weakness and lack of AFO  Ambulation/Gait             General Gait Details: not ambulatory without brace and platform walker  Stairs            Wheelchair Mobility    Modified Rankin (Stroke Patients Only) Modified Rankin (Stroke Patients Only) Pre-Morbid Rankin Score: Moderately severe disability Modified Rankin: Severe disability     Balance Overall  balance assessment: Needs assistance Sitting-balance support: Feet supported Sitting balance-Leahy Scale: Good       Standing balance-Leahy Scale: Poor Standing balance comment: pt requires UE support to fully balance                             Pertinent Vitals/Pain Pain Assessment: Faces Faces Pain Scale: Hurts a little bit Pain Location: R shoulder Pain Descriptors / Indicators: Discomfort Pain Intervention(s): Limited activity within patient's tolerance    Home Living Family/patient expects to be discharged to:: Private residence Living Arrangements: Spouse/significant other;Children Available Help at Discharge: Family;Available 24 hours/day Type of Home: Apartment Home Access: Stairs to enter     Home Layout: One level Home Equipment: Wheelchair - [employer] Corporationmanual;Walker - 2 wheels;Tub bench Additional Comments: has accessible apartment    Prior Function Level of Independence: Needs assistance   Gait / Transfers Assistance Needed: wife was assisting with gait on RW platform attached for RUE  ADL's / Homemaking Assistance Needed: wife is home with first child  Comments: pt worked with Conservation officer, 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 and lives with wife and 8yo son; wife is expecting baby #2     Hand Dominance   Dominant Hand: Left    Extremity/Trunk Assessment   Upper Extremity Assessment Upper Extremity Assessment: Defer to OT evaluation RUE Deficits / Details: R spastic hemiplegia; flexor synergy with yawn(+2 finger width inferior subluxation) RUE Coordination: decreased fine motor;decreased gross  motor    Lower Extremity Assessment Lower Extremity Assessment: Generalized weakness;RLE deficits/detail RLE Deficits / Details: has weakness with all RLE joints, has AFO and expecting R knee brace RLE Coordination: decreased fine motor;decreased gross motor    Cervical / Trunk Assessment Cervical / Trunk Assessment: Normal  Communication      Cognition Arousal/Alertness: Awake/alert Behavior  During Therapy: Flat affect Overall Cognitive Status: No family/caregiver present to determine baseline cognitive functioning                                        General Comments      Exercises Other Exercises Other Exercises: positioned ankle again and talked with pt about asking [employer] about another positioning splint Other Exercises: Pt has resting hand splint for R hand - wears at night   Assessment/Plan    PT Assessment Patient needs continued PT services  PT Problem List Decreased strength;Decreased range of motion;Decreased activity tolerance;Decreased balance;Decreased mobility;Decreased coordination;Decreased knowledge of use of DME;Decreased safety awareness;Cardiopulmonary status limiting activity;Other (comment)(clonus R ankle )       PT Treatment Interventions DME instruction;Gait training;Functional mobility training;Therapeutic activities;Therapeutic exercise;Balance training;Neuromuscular re-education;Patient/family education    PT Goals (Current goals can be found in the Care Plan section)  Acute Rehab PT Goals Patient Stated Goal: to go home PT Goal Formulation: With patient Time For Goal Achievement: 10/22/17 Potential to Achieve Goals: Fair    Frequency Min 3X/week   Barriers to discharge   has wife but is the only caregiver    Co-evaluation               AM-PAC PT "6 Clicks" Daily Activity  Outcome Measure Difficulty turning over in bed (including adjusting bedclothes, sheets and blankets)?: Unable Difficulty moving from lying on back to sitting on the side of the bed? : Unable Difficulty sitting down on and standing up from a chair with arms (e.g., wheelchair, bedside commode, etc,.)?: Unable Help needed moving to and from a bed to chair (including a wheelchair)?: A Little Help needed walking in hospital room?: A Lot Help needed climbing 3-5 steps with a railing? : Total 6 Click Score: 9    End of Session  Equipment Utilized During Treatment: Gait belt Activity Tolerance: Patient tolerated treatment well;Other (comment);Treatment limited secondary to medical complications (Comment)(weakness RLE) Patient left: in chair;with call bell/phone within reach Nurse Communication: Mobility status PT Visit Diagnosis: Unsteadiness on feet (R26.81);Muscle weakness (generalized) (M62.81);Other abnormalities of gait and mobility (R26.89);Hemiplegia and hemiparesis Hemiplegia - Right/Left: Right Hemiplegia - dominant/non-dominant: Dominant Hemiplegia - caused by: Cerebral infarction;Other Nontraumatic intracranial hemorrhage    Time: 1110-1135 PT Time Calculation (min) (ACUTE ONLY): 25 min   Charges:   PT Evaluation $PT Eval Moderate Complexity: 1 Mod PT Treatments $Therapeutic Activity: 8-22 mins   PT G Codes:   PT G-Codes **NOT FOR INPATIENT CLASS** Functional Assessment Tool Used: AM-PAC 6 Clicks Basic Mobility    Ivar Drape 10/08/2017, 11:58 AM   Samul Dada, PT MS Acute Rehab Dept. Number: [facility] [id number] and [facility] [phone]

## 2017-10-08 NOTE — Progress Notes (Signed)
Per insurance check on Eliquis  # 1.  SUSAN  @ [facility] # [phone]    ELIQUIS  5 MG BID  COVER- YES  CO-PAY- $ 60.00  TIER- 2 DRUG  PRIOR APPROVAL- NO   PREFERRED PHARMACY : YES- [facility]

## 2017-10-08 NOTE — Plan of Care (Signed)
  Problem: Education: Goal: Knowledge of General Education information will improve Outcome: Progressing Note:  POC reviewed with pt.   

## 2017-10-08 NOTE — Care Management Note (Addendum)
Case Management Note Donn PieriniKristi Danzig Macgregor RN, BSN Unit 4E-Case Manager [phone]  Patient Details  Name: Jeremy GashJao [name] MRN: [id number] Date of Birth: [birth year]  Subjective/Objective:  Pt admitted with DVT/PE hx of large left MCA stroke                    Action/Plan: PTA Pt lived at home, [facility]- [facility]- spoke with pt at bedside- [facility] and [facility] services with them- call made [facility]up[facility]Donna [facility].  pt also started on Eliquis- per insurance check copay $60- pt informed of coverage and provided both 30 day free card and copay assist card to use on discharge- [facility]- no further CM needs noted- pt to return home with family.   Expected Discharge Date:  10/08/17               Expected Discharge Plan:  Home w Home Health Services  In-House Referral:  NA  Discharge planning Services  CM Consult, Medication Assistance  Post Acute Care Choice:  Home Health, Resumption of Svcs/PTA Provider Choice offered to:  Patient  DME Arranged:  N/A DME Agency:  NA  HH Arranged:  RN, PT, OT, Speech Therapy HH Agency:  [facility]  Status of Service:  Completed, signed off  If discussed at Long Length of Stay Meetings, dates discussed:    Discharge Disposition: home/home health   Additional Comments:  Jeremy SpanWebster, Jeremy Faught Hall, RN 10/08/2017, 11:47 AM

## 2017-10-10 ENCOUNTER — Telehealth: Payer: Self-pay

## 2017-10-10 NOTE — Telephone Encounter (Signed)
[facility] for 1xwk X 4wks with 2prn visits also for PT and OT for 1 time each for Eval.   Called her back and approved verbal orders.

## 2017-10-11 ENCOUNTER — Telehealth: Payer: Self-pay | Admitting: *Deleted

## 2017-10-11 NOTE — Telephone Encounter (Signed)
[facility].  She did ask for permission to take orders from Dr Burnett ShengHedrick. In looking at Epic, Mr Jama FlavorsSham has gone back into the hospital under another provider and been discharged 10/08/17.  Our discharge was 09/06/17.  I explained to Amy that since he was readmitted and discharged with services from another provider, the PCP should signe the orders.  She agrees.

## 2017-10-25 ENCOUNTER — Other Ambulatory Visit: Payer: Self-pay | Admitting: Family Medicine

## 2017-10-25 DIAGNOSIS — M7989 Other specified soft tissue disorders: Secondary | ICD-10-CM

## 2017-10-26 ENCOUNTER — Ambulatory Visit
Admission: RE | Admit: 2017-10-26 | Discharge: 2017-10-26 | Disposition: A | Discharge status: Home / Self Care | Payer: BLUE CROSS/BLUE SHIELD | Source: Ambulatory Visit | Attending: Family Medicine | Admitting: Family Medicine

## 2017-10-26 DIAGNOSIS — M7989 Other specified soft tissue disorders: Secondary | ICD-10-CM | POA: Insufficient documentation

## 2017-11-01 ENCOUNTER — Other Ambulatory Visit: Payer: Self-pay

## 2017-11-01 ENCOUNTER — Ambulatory Visit: Payer: BLUE CROSS/BLUE SHIELD | Admitting: Physical Medicine & Rehabilitation

## 2017-11-01 ENCOUNTER — Encounter: Payer: BLUE CROSS/BLUE SHIELD | Attending: Registered Nurse

## 2017-11-01 ENCOUNTER — Encounter: Payer: Self-pay | Admitting: Physical Medicine & Rehabilitation

## 2017-11-01 VITALS — BP 133/94 | HR 99 | Ht 71.0 in | Wt 178.0 lb

## 2017-11-01 DIAGNOSIS — G4730 Sleep apnea, unspecified: Secondary | ICD-10-CM | POA: Insufficient documentation

## 2017-11-01 DIAGNOSIS — J45909 Unspecified asthma, uncomplicated: Secondary | ICD-10-CM | POA: Insufficient documentation

## 2017-11-01 DIAGNOSIS — I69351 Hemiplegia and hemiparesis following cerebral infarction affecting right dominant side: Secondary | ICD-10-CM

## 2017-11-01 DIAGNOSIS — F329 Major depressive disorder, single episode, unspecified: Secondary | ICD-10-CM | POA: Insufficient documentation

## 2017-11-01 DIAGNOSIS — I63512 Cerebral infarction due to unspecified occlusion or stenosis of left middle cerebral artery: Secondary | ICD-10-CM | POA: Diagnosis present

## 2017-11-01 DIAGNOSIS — Z9889 Other specified postprocedural states: Secondary | ICD-10-CM | POA: Diagnosis not present

## 2017-11-01 DIAGNOSIS — G441 Vascular headache, not elsewhere classified: Secondary | ICD-10-CM | POA: Diagnosis not present

## 2017-11-01 DIAGNOSIS — I10 Essential (primary) hypertension: Secondary | ICD-10-CM | POA: Diagnosis not present

## 2017-11-01 DIAGNOSIS — F419 Anxiety disorder, unspecified: Secondary | ICD-10-CM | POA: Diagnosis not present

## 2017-11-01 NOTE — Progress Notes (Signed)
Subjective:    Patient ID: Jeremy [name], male    DOB: 04/27/1976, 42 y.o.   MRN: [id number] 42 year old right-handed male with history of hypertension, on no antihypertensive medications, who presented and July 27, 2017, with right-sided weakness, headache, and slurred speech.  He lives with spouse and 40-year-old son, independent prior to admission.  Cranial CT scan showed left insula and frontal operculum lucency, compatible with infarction.  Echocardiogram with ejection fraction of 65%, no wall motion abnormalities, no PFO.  CT angiography of the head and neck showed a left M1 occlusion, intermediate left MCA distribution collateralization.  Underwent attempted mechanical thrombectomy per Interventional Radiology that was unsuccessful, required intubation. Followup MRI showed cerebellar edema, midline shift; placed on 3% saline.  Underwent left decompressive hemicraniectomy, placement of bone flap in abdominal pocket on July 29, 2017, per Dr. Conchita ParisNundkumar.  The patient extubated on June 02, 2018.  Diet slowly advanced.  Followup cranial CT scan on August 03, 2017, no acute changes.  Bilateral lower extremity Dopplers negative.  TEE was attempted on August 06, 2017, but one was unable to performed DATE OF ADMISSION:  08/07/2017 DATE OF DISCHARGE:  09/07/2017  HPI Spasm in legs R>L  Right leg shakes and it interferes with standing Interval medical history positive for DVT left lower extremity hospitalized for 2 days now on Eliquis. Wife is asking whether patient still needs to be on methylphenidate.  Patient has missed some doses and has not noted any difference in his cognition.  Wife has not noticed any difference in cognition when he has not taken his methylphenidate. No longer having headaches.  Currently on Topamax 25 mg twice daily.  Pain Inventory Average Pain 3 Pain Right Now 0 My pain is intermittent and dull  In the last 24 hours, has pain interfered with the  following? General activity 6 Relation with others 6 Enjoyment of life 6 What TIME of day is your pain at its worst? daytime Sleep (in general) Good  Pain is worse with: walking, bending and standing Pain improves with: rest, heat/ice and medication Relief from Meds: 3  Mobility walk with assistance use a cane use a walker do you drive?  no use a wheelchair  Function disabled: date disabled 2019  Neuro/Psych tremor spasms  Prior Studies Any changes since last visit?  no  Physicians involved in your care Any changes since last visit?  no   Family History  Problem Relation Age of Onset  . Hypertension Father   . Hypertension Brother    Social History   Socioeconomic History  . Marital status: Married    Spouse name: Not on file  . Number of children: Not on file  . Years of education: Not on file  . Highest education level: Not on file  Occupational History  . Not on file  Social Needs  . Financial resource strain: Not on file  . Food insecurity:    Worry: Not on file    Inability: Not on file  . Transportation needs:    Medical: Not on file    Non-medical: Not on file  Tobacco Use  . Smoking status: Never Smoker  . Smokeless tobacco: Never Used  Substance and Sexual Activity  . Alcohol use: Yes    Comment: RARE  . Drug use: No  . Sexual activity: Not on file  Lifestyle  . Physical activity:    Days per week: Not on file    Minutes per session: Not on file  . Stress:  Not on file  Relationships  . Social connections:    Talks on phone: Not on file    Gets together: Not on file    Attends religious service: Not on file    Active member of club or organization: Not on file    Attends meetings of clubs or organizations: Not on file    Relationship status: Not on file  Other Topics Concern  . Not on file  Social History Narrative  . Not on file   Past Surgical History:  Procedure Laterality Date  . CRANIOTOMY Left 07/29/2017   Procedure:  LEFT HEMI- CRANIECTOMY WITH PLACEMENT OF BONE FLAP IN ABDOMEN;  Surgeon: Lisbeth Renshaw, MD;  Location: [facility] OR;  Service: Neurosurgery;  Laterality: Left;  . IR ANGIO INTRA EXTRACRAN SEL COM CAROTID INNOMINATE UNI R MOD SED  07/28/2017  . IR ANGIO VERTEBRAL SEL VERTEBRAL UNI R MOD SED  07/28/2017  . IR CT HEAD LTD  07/28/2017  . IR PERCUTANEOUS ART THROMBECTOMY/INFUSION INTRACRANIAL INC DIAG ANGIO  07/28/2017  . NASAL SEPTOPLASTY W/ TURBINOPLASTY Bilateral 03/06/2017   Procedure: NASAL SEPTOPLASTY WITH TURBINATE REDUCTION;  Surgeon: Linus Salmons, MD;  Location: [facility] ORS;  Service: ENT;  Laterality: Bilateral;  . RADIOLOGY WITH ANESTHESIA N/A 07/27/2017   Procedure: RADIOLOGY WITH ANESTHESIA;  Surgeon: Julieanne Cotton, MD;  Location: [facility] OR;  Service: Radiology;  Laterality: N/A;   Past Medical History:  Diagnosis Date  . Anxiety   . Asthma    MILD AS A CHILD  . Depression   . Deviated septum   . Dyspnea    NORMAL SPIROMETRY 02/01/17 VISIT WITH DR HEDRICK  . Hypertension   . Sleep apnea   . Stroke (HCC)    BP (!) 133/94   Pulse 99   Ht 5\' 11"  (1.803 m)   Wt 178 lb (80.7 kg)   SpO2 98%   BMI 24.83 kg/m   Opioid Risk Score:   Fall Risk Score:  `1  Depression screen PHQ 2/9  Depression screen PHQ 2/9 09/24/2017  Decreased Interest 2  Down, Depressed, Hopeless 1  PHQ - 2 Score 3  Altered sleeping 0  Tired, decreased energy 2  Change in appetite 2  Feeling bad or failure about yourself  1  Trouble concentrating 0  Moving slowly or fidgety/restless 0  Suicidal thoughts 0  PHQ-9 Score 8  Difficult doing work/chores Somewhat difficult     Review of Systems  Constitutional: Negative.   HENT: Negative.   Eyes: Negative.   Respiratory: Negative.   Cardiovascular: Negative.   Gastrointestinal: Negative.   Endocrine: Negative.   Genitourinary: Negative.   Musculoskeletal: Positive for myalgias.  Skin: Negative.   Allergic/Immunologic: Negative.   Neurological:  Positive for tremors.  Hematological: Negative.   Psychiatric/Behavioral: Negative.   All other systems reviewed and are negative.      Objective:   Physical Exam  Constitutional: He appears well-developed and well-nourished. No distress.  HENT:  Head: Normocephalic and atraumatic.  Eyes: Pupils are equal, round, and reactive to light. Conjunctivae are normal.  Neck: Normal range of motion.  Neurological: Coordination and gait abnormal.  Motor strength is 0/5 in the right deltoid, bicep, tricep, grip 3- right hip knee extensor synergy 0 at the ankle Ambulates with walker, Velcro over right wrist.  Uses Swedish knee cage and right AFO.  Small step length slow gait velocity Tone exhibits sustained clonus at the right ankle.  He also has a tendency towards foot inversion during ambulation this  is relatively well controlled with AFO  Skin: He is not diaphoretic.  Psychiatric: Thought content normal. His affect is blunt. His speech is delayed. He is slowed and withdrawn.  Nursing note and vitals reviewed.         Assessment & Plan:  1.  Large left MCA infarct fortunately he does not have a significant aphasia but does have cognitive deficits including delayed processing In addition he has a right spastic hemiplegia, no neurologic recovery of the right upper extremity with poor prognosis right lower extremity does have some proximal return and more distal spasticity. He may discontinue methylphenidate discussed with patient and wife  Recommend botulinum toxin Right posterior tibialis 75 units Right medial gastroc 50 units Right lateral gastroc 50 units Right medial soleus 50 units Right lateral soleus 50 units  2.  Vascular headaches improved reduce Topamax to 25 mill grams nightly times 1 week then discontinue may need to resume if headaches recur

## 2017-11-01 NOTE — Patient Instructions (Addendum)
May discontinue ritalin (methylphenidate)  Reduce topamax to bedtime only for 1 wk , if no headaches may discontinue

## 2017-11-05 ENCOUNTER — Other Ambulatory Visit: Payer: Self-pay

## 2017-11-05 NOTE — Patient Outreach (Signed)
Telephone outreach to patient to obtain mRS was successfully completed. mRS = 3 

## 2017-11-08 ENCOUNTER — Ambulatory Visit: Payer: BLUE CROSS/BLUE SHIELD | Admitting: Neurology

## 2017-11-08 ENCOUNTER — Encounter: Payer: Self-pay | Admitting: Neurology

## 2017-11-08 VITALS — BP 128/95 | HR 80 | Ht 71.0 in | Wt 180.0 lb

## 2017-11-08 DIAGNOSIS — I6602 Occlusion and stenosis of left middle cerebral artery: Secondary | ICD-10-CM | POA: Diagnosis not present

## 2017-11-08 NOTE — Patient Instructions (Signed)
I had a long d/w patient and his wife about his recent stroke, hemicraniectomy, DVT, risk for recurrent stroke/TIAs, personally independently reviewed imaging studies and stroke evaluation results and answered questions.Continue Eliquis (apixaban) daily  for DVT and  for secondary stroke prevention and maintain strict control of hypertension with blood pressure goal below 130/90, diabetes with hemoglobin A1c goal below 6.5% and lipids with LDL cholesterol goal below 70 mg/dL. I also advised the patient to eat a healthy diet with plenty of whole grains, cereals, fruits and vegetables, exercise regularly and maintain ideal body weight Provider, please review the patient's drug allergy history. Some issues need clarification. Transesophageal echocardiogram for PFO. Referred to Dr. [name] for replacement of hemicraniectomy flap as it has been more than 3 months since his strokeContinue outpatient physical occupational therapy and use of walker at all times and we discussed fall and safety prevention precautions.Followup in the future with my nurse practitioner Shanda Bumps in 3 months or call earlier if necessary   Fall Prevention in the Home Falls can cause injuries. They can happen to people of all ages. There are many things you can do to make your home safe and to help prevent falls. What can I do on the outside of my home?  Regularly fix the edges of walkways and driveways and fix any cracks.  Remove anything that might make you trip as you walk through a door, such as a raised step or threshold.  Trim any bushes or trees on the path to your home.  Use bright outdoor lighting.  Clear any walking paths of anything that might make someone trip, such as rocks or tools.  Regularly check to see if handrails are loose or broken. Make sure that both sides of any steps have handrails.  Any raised decks and porches should have guardrails on the edges.  Have any leaves, snow, or ice cleared  regularly.  Use sand or salt on walking paths during winter.  Clean up any spills in your garage right away. This includes oil or grease spills. What can I do in the bathroom?  Use night lights.  Install grab bars by the toilet and in the tub and shower. Do not use towel bars as grab bars.  Use non-skid mats or decals in the tub or shower.  If you need to sit down in the shower, use a plastic, non-slip stool.  Keep the floor dry. Clean up any water that spills on the floor as soon as it happens.  Remove soap buildup in the tub or shower regularly.  Attach bath mats securely with double-sided non-slip rug tape.  Do not have throw rugs and other things on the floor that can make you trip. What can I do in the bedroom?  Use night lights.  Make sure that you have a light by your bed that is easy to reach.  Do not use any sheets or blankets that are too big for your bed. They should not hang down onto the floor.  Have a firm chair that has side arms. You can use this for support while you get dressed.  Do not have throw rugs and other things on the floor that can make you trip. What can I do in the kitchen?  Clean up any spills right away.  Avoid walking on wet floors.  Keep items that you use a lot in easy-to-reach places.  If you need to reach something above you, use a strong step stool that has a grab  bar.  Keep electrical cords out of the way.  Do not use floor polish or wax that makes floors slippery. If you must use wax, use non-skid floor wax.  Do not have throw rugs and other things on the floor that can make you trip. What can I do with my stairs?  Do not leave any items on the stairs.  Make sure that there are handrails on both sides of the stairs and use them. Fix handrails that are broken or loose. Make sure that handrails are as long as the stairways.  Check any carpeting to make sure that it is firmly attached to the stairs. Fix any carpet that is loose  or worn.  Avoid having throw rugs at the top or bottom of the stairs. If you do have throw rugs, attach them to the floor with carpet tape.  Make sure that you have a light switch at the top of the stairs and the bottom of the stairs. If you do not have them, ask someone to add them for you. What else can I do to help prevent falls?  Wear shoes that: ? Do not have high heels. ? Have rubber bottoms. ? Are comfortable and fit you well. ? Are closed at the toe. Do not wear sandals.  If you use a stepladder: ? Make sure that it is fully opened. Do not climb a closed stepladder. ? Make sure that both sides of the stepladder are locked into place. ? Ask someone to hold it for you, if possible.  Clearly mark and make sure that you can see: ? Any grab bars or handrails. ? First and last steps. ? Where the edge of each step is.  Use tools that help you move around (mobility aids) if they are needed. These include: ? Canes. ? Walkers. ? Scooters. ? Crutches.  Turn on the lights when you go into a dark area. Replace any light bulbs as soon as they burn out.  Set up your furniture so you have a clear path. Avoid moving your furniture around.  If any of your floors are uneven, fix them.  If there are any pets around you, be aware of where they are.  Review your medicines with your doctor. Some medicines can make you feel dizzy. This can increase your chance of falling. Ask your doctor what other things that you can do to help prevent falls. This information is not intended to replace advice given to you by your health care provider. Make sure you discuss any questions you have with your health care provider. Document Released: 04/29/2009 Document Revised: 12/09/2015 Document Reviewed: 08/07/2014 Elsevier Interactive Patient Education  [employer]2018 Else[employer] Inc.

## 2017-11-08 NOTE — Progress Notes (Signed)
[facility] [address] [place]. [place] [address] [phone]       OFFICE FOLLOW-UP NOTE  [name] Date of Birth:  1975/12/08 Medical Record Number:  [id number]   HPI: [name] is a pleasant 42 year old Asian male seen today for first office follow-up visit following hospital admission for stroke in January 2019. He is accompanied by his wife. History is obtained from them as well as review of electronic medical records. I personally reviewed imaging films.Jeremy [name] is a 42 y.o. male past history of hypertension, on antihypertensives, who was in usual state of health last seen normal at 8 PM today by his wife, was noted to fall off of bed around 8:10 PM and found to have left gaze preference, right facial droop, slurred speech and right-sided weakness.  EMS arrived on site, blood pressure was in the normal range.  CBG was in the normal range.  He had dense right hemiplegia, right-sided neglect, left gaze preference, right facial droop and initially incoherent speech but then only slurred speech. He was brought into the emergency room at [facility] and evaluated on the bridge.  On arrival, initial NIH stroke scale was 20.  Noncontrast CT of the head was negative for bleed.  It did show a dense left MCA and and aspects of 8.  IV TPA was initiated at 9:11 PM.  CT angiogram and perfusion study were done.  Confirmed the left MCA occlusion.  Endovascular team was consulted even prior to obtaining the CT angiogram and the patient was taken in for endovascular thrombectomy.  LKW: 8 PM on 07/27/2017 tpa given?: yes Premorbid modified Rankin scale (mRS): 0 he had unsuccessful  mechanical embolectomy with small subarachnoid hemorrhage and cytotoxic edema  needing emergent hemicraniectomy on 07/29/2017 by Dr.Nundkumar with placement of the bone flap in the abdomen. Patient was admitted in the neurological intensive care unit where he initially needed mechanical ventilation and hypertonic  saline. These were gradually withdrawn as his condition improved. He is transferred to inpatient rehabilitation and has done well and is currently at home with his wife. He developed deep and thrombosis in the left eye in March and has been started on eliquis. Patient is able to walk with a walker. He is states his balance is not great but he is had no falls or injuries. He does need help with some activities of daily living but is able to feed himself. He can be left alone for hours. He needs some help with transfers.She is able to speak well and has good understanding and no cognitive deficits. His blood pressure is well controlled today it is 128/95. He has finished home physical therapy and plans to start outpatient physical and occupational therapy in [place] soon. Transesophageal echocardiogram was attempted while he was in the hospital but was unsuccessful.The etiology of his stroke is still unclear and now with the diagnosis of DVT possibility of paradoxical embolism and PFO is to be ruled out. He denies any prior history of DVT pulmonary embolism or family history of strokes or heart attacks at a young age.     ROS:   14 system review of systems is positive for  Leg swelling, gait difficulty, weakness,stiffness, tremors and all other systems negative PMH:  Past Medical History:  Diagnosis Date  . Anxiety   . Asthma    MILD AS A CHILD  . Depression   . Deviated septum   . Dyspnea    NORMAL SPIROMETRY 02/01/17 VISIT WITH DR  HEDRICK  . Hypertension   . Sleep apnea   . Stroke [facility])     Social History:  Social History   Socioeconomic History  . Marital status: Married    Spouse name: Not on file  . Number of children: Not on file  . Years of education: Not on file  . Highest education level: Not on file  Occupational History  . Not on file  Social Needs  . Financial resource strain: Not on file  . Food insecurity:    Worry: Not on file    Inability: Not on file  .  Transportation needs:    Medical: Not on file    Non-medical: Not on file  Tobacco Use  . Smoking status: Never Smoker  . Smokeless tobacco: Never Used  Substance and Sexual Activity  . Alcohol use: Yes    Comment: RARE  . Drug use: No  . Sexual activity: Not on file  Lifestyle  . Physical activity:    Days per week: Not on file    Minutes per session: Not on file  . Stress: Not on file  Relationships  . Social connections:    Talks on phone: Not on file    Gets together: Not on file    Attends religious service: Not on file    Active member of club or organization: Not on file    Attends meetings of clubs or organizations: Not on file    Relationship status: Not on file  . Intimate partner violence:    Fear of current or ex partner: Not on file    Emotionally abused: Not on file    Physically abused: Not on file    Forced sexual activity: Not on file  Other Topics Concern  . Not on file  Social History Narrative  . Not on file    Medications:   Current Outpatient Medications on File Prior to Visit  Medication Sig Dispense Refill  . amLODipine (NORVASC) 5 MG tablet Take 5 mg by mouth daily.     Marland Kitchen aspirin EC 81 MG tablet Take 1 tablet (81 mg total) by mouth daily. 30 tablet 0  . ELIQUIS STARTER PACK (ELIQUIS STARTER PACK) 5 MG TABS Take as directed on package: start with two-5mg  tablets twice daily for 7 days. On day 8, switch to one-5mg  tablet twice daily. 1 each 0  . escitalopram (LEXAPRO) 10 MG tablet Take 1 tablet (10 mg total) by mouth daily. 30 tablet 0  . gabapentin (NEURONTIN) 100 MG capsule Take 200 mg by mouth 3 (three) times daily.    Marland Kitchen loratadine (CLARITIN) 10 MG tablet Take 10 mg by mouth daily.    . rosuvastatin (CRESTOR) 10 MG tablet Take 10 mg by mouth daily.    Marland Kitchen spironolactone (ALDACTONE) 50 MG tablet Take 50 mg by mouth daily.      No current facility-administered medications on file prior to visit.     Allergies:   Allergies  Allergen Reactions    . Blood-Group Specific Substance Other (See Comments)    PT [facility] WITNESS DOES NOT ACCEPT BLOOD PRODUCTS  . Lipitor [Atorvastatin Calcium] Other (See Comments)    myalgia    Physical Exam General: well developed, well nourished middle-aged Asian male, seated, in no evident distress Head: head normocephalic and atraumatic.  Neck: supple with no carotid or supraclavicular bruits Cardiovascular: regular rate and rhythm, no murmurs Musculoskeletal: no deformity Skin:  no rash/petichiae Vascular:  Normal pulses all extremities Vitals:  11/08/17 0837  BP: (!) 128/95  Pulse: 80   Neurologic Exam Mental Status: Awake and fully alert. Oriented to place and time. Recent and remote memory intact. Attention span, concentration and fund of knowledge appropriate. Mood and affect appropriate. No dysarthria or aphasia. Able to name ,comprehend and repeat well Cranial Nerves: Fundoscopic exam reveals sharp disc margins. Pupils equal, briskly reactive to light. Extraocular movements full without nystagmus. Visual fields full to confrontation. Hearing intact. Facial sensation intact. Moderate right lower facial weakness., tongue, palate moves normally and symmetrically.  Motor: spastic right hemiplegia with right upper and lower extremity drift. 3/5 strength in the right upper extremity with significant weakness of grip and intrinsic hand muscles. 4/5 strength in the right lower extremity with hip flexor and ankle dorsiflexor weakness. Tone is increased on the right. Sensory.: intact to touch ,pinprick .position and vibratory sensation.  Coordination: impaired on the right and normal on the left. Gait and Station: Deferred as patient did not bring his walker and is in a wheelchair.  Reflexes: 2+ and asymmetric and brisker on the right.. Toes downgoing.   NIHSS  10 Modified Rankin  3  ASSESSMENT: 42 year old Asian male with large left middle cerebral artery infarct  with cytotoxic edemaof  cryptogenic etiology status post hemicraniectomy and January 2019 with residual spastic right hemiplegia who is otherwise doing well    PLAN: I had a long d/w patient and his wife about his recent stroke, hemicraniectomy, DVT, risk for recurrent stroke/TIAs, personally independently reviewed imaging studies and stroke evaluation results and answered questions.Continue Eliquis (apixaban) daily  for DVT and  for secondary stroke prevention and maintain strict control of hypertension with blood pressure goal below 130/90, diabetes with hemoglobin A1c goal below 6.5% and lipids with LDL cholesterol goal below 70 mg/dL. I also advised the patient to eat a healthy diet with plenty of whole grains, cereals, fruits and vegetables, exercise regularly and maintain ideal body weight Provider, please review the patient's drug allergy history. Some issues need clarification. Transesophageal echocardiogram for PFO. Referred to Dr. Conchita ParisNundKumar for replacement of hemicraniectomy bone flap as it has been more than 3 months since his strokeContinue outpatient physical occupational therapy and use of walker at all times and we discussed fall and safety prevention precautions.Followup in the future with my nurse practitioner Shanda BumpsJessica in 3 months or call earlier if necessary Greater than 50% of time during this 25 minute visit was spent on counseling,explanation of diagnosis MCA infarct, hemicraniectomy, planning of further management, discussion with patient and family and coordination of care Delia HeadyPramod Dynesha Woolen, MD  [facility] [address] [place], [place] [id number]  Phone [phone] Fax [phone] Note: This document was prepared with digital dictation and possible smart phrase technology. Any transcriptional errors that result from this process are unintentional

## 2017-11-13 ENCOUNTER — Ambulatory Visit: Payer: BLUE CROSS/BLUE SHIELD | Attending: Family Medicine | Admitting: Occupational Therapy

## 2017-11-13 ENCOUNTER — Other Ambulatory Visit: Payer: Self-pay

## 2017-11-13 ENCOUNTER — Encounter: Payer: Self-pay | Admitting: Occupational Therapy

## 2017-11-13 DIAGNOSIS — M6281 Muscle weakness (generalized): Secondary | ICD-10-CM | POA: Diagnosis present

## 2017-11-13 DIAGNOSIS — I69353 Hemiplegia and hemiparesis following cerebral infarction affecting right non-dominant side: Secondary | ICD-10-CM | POA: Insufficient documentation

## 2017-11-13 DIAGNOSIS — M25511 Pain in right shoulder: Secondary | ICD-10-CM | POA: Insufficient documentation

## 2017-11-13 DIAGNOSIS — R278 Other lack of coordination: Secondary | ICD-10-CM | POA: Diagnosis present

## 2017-11-13 NOTE — Therapy (Signed)
[facility] MAIN [facility] SERVICES [address] [place], [place], [address] Phone: [phone]   Fax:  [phone]  Occupational Therapy Evaluation  Patient Details  Name: Jeremy [name] MRN: [id number] Date of Birth: 02-12-1976 No data recorded  Encounter Date: 11/13/2017  OT End of Session - 11/13/17 2139    Visit Number  1    Number of Visits  24    Date for OT Re-Evaluation  02/05/18    OT Start Time  1420    OT Stop Time  1530    OT Time Calculation (min)  70 min    Activity Tolerance  Patient tolerated treatment well    Behavior During Therapy  [facility] for tasks assessed/performed       Past Medical History:  Diagnosis Date  . Anxiety   . Asthma    MILD AS A CHILD  . Depression   . Deviated septum   . Dyspnea    NORMAL SPIROMETRY 02/01/17 VISIT WITH DR HEDRICK  . Hypertension   . Sleep apnea   . Stroke [facility])     Past Surgical History:  Procedure Laterality Date  . CRANIOTOMY Left 07/29/2017   Procedure: LEFT HEMI- CRANIECTOMY WITH PLACEMENT OF BONE FLAP IN ABDOMEN;  Surgeon: Lisbeth Renshaw, MD;  Location: [facility] OR;  Service: Neurosurgery;  Laterality: Left;  . IR ANGIO INTRA EXTRACRAN SEL COM CAROTID INNOMINATE UNI R MOD SED  07/28/2017  . IR ANGIO VERTEBRAL SEL VERTEBRAL UNI R MOD SED  07/28/2017  . IR CT HEAD LTD  07/28/2017  . IR PERCUTANEOUS ART THROMBECTOMY/INFUSION INTRACRANIAL INC DIAG ANGIO  07/28/2017  . NASAL SEPTOPLASTY W/ TURBINOPLASTY Bilateral 03/06/2017   Procedure: NASAL SEPTOPLASTY WITH TURBINATE REDUCTION;  Surgeon: Linus Salmons, MD;  Location: [facility] ORS;  Service: ENT;  Laterality: Bilateral;  . RADIOLOGY WITH ANESTHESIA N/A 07/27/2017   Procedure: RADIOLOGY WITH ANESTHESIA;  Surgeon: Julieanne Cotton, MD;  Location: [facility] OR;  Service: Radiology;  Laterality: N/A;    There were no vitals filed for this visit.  Subjective Assessment - 11/13/17 1428    Subjective   Patient reports he had a stroke, wife is present.   Wife reports patient thought he had the flu, had a fever and got worse over the week, she found him in the floor in the bedroom and called for help.     Pertinent History  42 year old right-handed male with history of hypertension, on no antihypertensive medications, who presented and July 27, 2017, with right-sided weakness, headache, and slurred speech.  He lives with spouse and 17-year-old son, independent prior to admission.  Cranial CT scan showed left insula and frontal operculum lucency, compatible with infarction.  Echocardiogram with ejection fraction of 65%, no wall motion abnormalities, no PFO.  CT angiography of the head and neck showed a left M1 occlusion, intermediate left MCA distribution collateralization.  Underwent attempted mechanical thrombectomy per Interventional Radiology that was unsuccessful, required intubation.Followup MRI showed cerebellar edema, midline shift; placed on 3% saline.  Underwent left decompressive hemicraniectomy, placement of bone flap in abdominal pocket on July 29, 2017, per Dr. [name].    Patient Stated Goals  Patient would like to regain ability to take care of self.    Currently in Pain?  No/denies    Pain Score  0-No pain    Multiple Pain Sites  No        [facility] OT Assessment - 11/13/17 1435      Assessment   Medical Diagnosis  CVA  Onset Date/Surgical Date  07/27/17    Hand Dominance  Left      Precautions   Precautions  Other (comment)    Precaution Comments  wears helmet s/p craniotomy with bone flap    Required Braces or Orthoses  Other Brace/Splint      Balance Screen   Has the patient fallen in the past 6 months  Yes    How many times?  once out of bed with CVA    Has the patient had a decrease in activity level because of a fear of falling?   No    Is the patient reluctant to leave their home because of a fear of falling?   No      Home  Environment   Family/patient expects to be discharged to:  Private residence    Living  Arrangements  Spouse/significant other    Available Help at Discharge  Family    Type of Home  Aartment    Home Access  Level entry    Home Layout  Multi-level    Bathroom Shower/Tub  Tub/Shower unit;Curtain    Shower/tub characteristics  Curtain    Bathroom Accessibility  Yes    How accessible  Accessible via wheelchair;Accessible via walker    Home Equipment  Walker - 2 wheels;Tub bench;Grab bars - tub/shower;Grab bars - toilet;Toilet riser;Hand held shower head;Wheelchair - manual    Lives With  Spouse;Family      Prior Function   Level of Independence  Independent    Vocation  Full time employment    Leisure  cooking      ADL   Eating/Feeding  Needs assist with cutting food    Grooming  Minimal assistance    Upper Body Bathing  Supervision/safety    Lower Body Bathing  Supervision/safety    Upper Body Dressing  Increased time    Lower Body Dressing  Increased time    Toilet Transfer  Minimal assistance    Toileting - Clothing Manipulation  Minimal assistance;Increased time    Where Assessed - Toileting Clothing Manipulationn  Supervision/safety    Tub/Shower Transfer  Minimal assistance    ADL comments  Patient currently requires assistance with self care tasks, he requires assist with tub transfer and then able to bathe self from seated position with use of left dominant hand only.  He requires assist with being able to pull up and negotiate his clothing over his hips with dressing and toileting.  He has an AFO on his right LE, he ambulates with a rolling walker inside the home, has a splint on his walker to help with holding and positioning right arm, he uses wheelchair when going out and longer distances.       IADL   Prior Level of Function Shopping  independent    Shopping  Needs to be accompanied on any shopping trip    Prior Level of Function Light Housekeeping  independent     Light Housekeeping  Does not participate in any housekeeping tasks    Prior Level of Function  Meal Prep  independent    Meal Prep  Needs to have meals prepared and served    Prior Level of Function Curator  Relies on family or friends for transportation    Prior Level of Function Medication Managment  independent    Medication Management  Is not capable of dispensing or managing own medication    Prior Level of Function  Financial Management  independent    Financial Management  Dependent      Mobility   Mobility Status  Needs assist    Mobility Status Comments  He has an AFO on his right LE, he ambulates with a rolling walker inside the home, has a splint on his walker to help with holding and positioning right arm, he uses wheelchair when going out and longer distances..  Balance is impaired in standing.  Clonus noted on the right LE in standing      Written Expression   Dominant Hand  Left      Vision - History   Baseline Vision  Wears glasses all the time    Additional Comments  Will continue to assess vision, patient demonstrates decreased peripheral vision on the right side.       Cognition   Overall Cognitive Status  Impaired/Different from baseline    Problem Solving  Impaired    Cognition Comments  Patient demonstrates slow processing of information at times, difficulty with problem solving abilities, fair memory for past, impaired more for recent events.       Sensation   Light Touch  Impaired Detail    Light Touch Impaired Details  Absent RUE    Stereognosis  Impaired Detail    Stereognosis Impaired Details  Absent RUE    Hot/Cold  Impaired Detail    Hot/Cold Impaired Details  Absent RUE    Proprioception  Impaired Detail      Coordination   Gross Motor Movements are Fluid and Coordinated  No    Fine Motor Movements are Fluid and Coordinated  No    9 Hole Peg Test  Right;Left    Right 9 Hole Peg Test  unable    Left 9 Hole Peg Test  25 secs      ROM / Strength   AROM / PROM / Strength  AROM;Strength;PROM       AROM   Overall AROM   Deficits    Overall AROM Comments  LUE WNLs, RUE with mixed tone, with no active movement from shoulder to digits at this time      PROM   Overall PROM   Deficits    Overall PROM Comments  2 finger subluxation on the right, therefore PROM only to shoulder height with joint approximated. Pain present 2/10 at the right shoulder      Strength   Overall Strength  Deficits    Overall Strength Comments  mixed tone at this time, no active muscle contraction on right.       Hand Function   Right Hand Grip (lbs)  0    Right Hand Lateral Pinch  0 lbs    Right Hand 3 Point Pinch  0 lbs    Left Hand Grip (lbs)  45    Left Hand Lateral Pinch  11 lbs    Left 3 point pinch  11 lbs           Patient educated on positioning of right arm for edema control, mild edema noted in the dorsum of the right hand this date.  Patient instructed on donning and doffing shoulder brace and to limit use of sling if at all. Patient instructed on results of sensory testing and implications in daily routine, will need additional instruction in this area.             OT Education - 11/13/17 2138    Education provided  Yes    Education  Details  role of OT, goals, POC    Person(s) Educated  Patient;Spouse    Methods  Explanation    Comprehension  Verbalized understanding          OT Long Term Goals - 11/13/17 2155      OT LONG TERM GOAL #1   Title  Patient will demonstrate improved balance to perform clothing negotiation with dressing/toileting with supervision only.     Baseline  min to mod assist to complete at eval    Time  8    Period  Weeks    Status  New    Target Date  01/08/18      OT LONG TERM GOAL #2   Title  Patient will demonstrate ability to manage shoulder brace with modified independence.    Baseline  unable at eval    Time  4    Period  Weeks    Status  New    Target Date  12/12/17      OT LONG TERM GOAL #3   Title  Patient will demonstrate trace  movement of elbow extension with facilitation to work towards neuromuscular reeducation for active arm use on the right .    Baseline  no active movement at eval    Time  12    Period  Weeks    Status  New    Target Date  02/05/18      OT LONG TERM GOAL #4   Title  Patient will demonstate understanding of compensatory strategies to protect right UE from harm during engagement  in daily activities.     Baseline  sensation absent in right UE and at risk for injury.    Time  12    Period  Weeks    Status  New    Target Date  02/05/18      OT LONG TERM GOAL #5   Title  Patient will complete cutting meat one handed with modified independence with adaptive equipment as needed.     Baseline  unable at eval    Time  8    Period  Weeks    Status  New    Target Date  01/05/18            Plan - 11/13/17 2140    Clinical Impression Statement  Patient is a 42 yo male who suffered a CVA, underwent craniotomy and currently has a bone flap.  Patient was seen for IP rehab, discharged home with home health and now presents for OP OT evaluation.  Patient presents with RUE mixed tone, mostly flaccid with some increased tone at times in RUE, no active movement in RUE at this time, absent sensation on the right from shoulder to digits for light touch, deep pressure, sharp/dull and hot/cold, pt requires increased assistance with transfers, functional mobility and now has to use assistive devices, he demonstrates some decreased peripheral vision on the right side, he demonstrates clonus in the right LE with standing and with sitting at times, demonstrates processing delay and decreased problem solving abilities. Patient's left dominant side demonstrates decreased grip strength for age.  Patient will benefit from skilled OT to address the stated impairments and to maximize safety and independence in daily tasks.     Occupational Profile and client history currently impacting functional performance  absent  sensation in RUE from shoulder to digits, no active right UE movement, decreased right peripheral vision, has 88 week old infant at home, recent move from 3rd  story apartment, unable to work, bone flap with helmet precautions.    Occupational performance deficits (Please refer to evaluation for details):  ADL's;IADL's;Leisure;Work    Electrical engineer    Current Impairments/barriers affecting progress:  positive:  motivation, family support.  Negative:  young age, lack of sensation on the right for light touch, sharp/dull, deep pressure, hot/cold, processing delay cognitively    OT Frequency  2x / week    OT Duration  12 weeks    OT Treatment/Interventions  Self-care/ADL training;Electrical Stimulation;Therapeutic exercise;Visual/perceptual remediation/compensation;Moist Heat;Neuromuscular education;Patient/family education;Splinting;Building services engineer;Therapeutic activities;Balance training;Cryotherapy;Contrast Bath;DME and/or AE instruction;Manual Therapy;Passive range of motion;Cognitive remediation/compensation    Clinical Decision Making  Multiple treatment options, significant modification of task necessary    Consulted and Agree with Plan of Care  Patient;Family member/caregiver    Family Member Consulted  wife Melanie       Patient will benefit from skilled therapeutic intervention in order to improve the following deficits and impairments:  Abnormal gait, Decreased cognition, Decreased knowledge of use of DME, Increased edema, Pain, Decreased coordination, Decreased mobility, Impaired sensation, Decreased activity tolerance, Decreased endurance, Decreased range of motion, Decreased strength, Impaired tone, Decreased balance, Decreased knowledge of precautions, Decreased safety awareness, Difficulty walking, Impaired UE functional use  Visit Diagnosis: Hemiplegia and hemiparesis following cerebral infarction affecting right non-dominant side (HCC)  Muscle weakness  (generalized)  Other lack of coordination  Acute pain of right shoulder    Problem List Patient Active Problem List   Diagnosis Date Noted  . PE (pulmonary thromboembolism) (HCC) 10/06/2017  . DVT (deep vein thrombosis) in pregnancy (HCC) 10/06/2017  . Acute blood loss anemia   . Vascular headache   . Spastic hemiplegia of right dominant side as late effect of cerebral infarction (HCC)   . Benign essential HTN   . Frontal lobe and executive function deficit following cerebral infarction   . Adjustment disorder with mixed anxiety and depressed mood   . 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 Aphasia due to acute stroke (HCC) 08/07/2017  . B12 deficiency   . Acute respiratory failure (HCC)   . Central line infiltration (HCC)   . Endotracheal tube present   . Fever   . Leukocytosis   . S/P craniotomy 07/30/2017  . Cerebral edema (HCC) 07/28/2017  . Hyperlipidemia 07/28/2017  . Stroke (cerebrum) (HCC) 07/27/2017   Amy T Lovett, OTR/L, CLT  Lovett,Amy 11/13/2017, 10:05 PM  [place] [facility] MAIN [facility] SERVICES [address] [place], [place], [address] Phone: [phone]   Fax:  [phone]  Name: Triston Skare MRN: [id number] Date of Birth: Jun 08, 1976

## 2017-11-15 ENCOUNTER — Encounter: Payer: Self-pay | Admitting: Physical Therapy

## 2017-11-15 ENCOUNTER — Other Ambulatory Visit: Payer: Self-pay

## 2017-11-15 ENCOUNTER — Ambulatory Visit: Payer: BLUE CROSS/BLUE SHIELD | Attending: Family Medicine | Admitting: Physical Therapy

## 2017-11-15 DIAGNOSIS — M6281 Muscle weakness (generalized): Secondary | ICD-10-CM

## 2017-11-15 DIAGNOSIS — R278 Other lack of coordination: Secondary | ICD-10-CM | POA: Insufficient documentation

## 2017-11-15 DIAGNOSIS — I69353 Hemiplegia and hemiparesis following cerebral infarction affecting right non-dominant side: Secondary | ICD-10-CM

## 2017-11-15 DIAGNOSIS — R2689 Other abnormalities of gait and mobility: Secondary | ICD-10-CM | POA: Diagnosis present

## 2017-11-16 NOTE — Therapy (Signed)
[place] [facility] MAIN [facility] SERVICES [address] [place], [place], [address] Phone: [phone]   Fax:  [phone]  Physical Therapy Evaluation  Patient Details  Name: Jeremy [name] MRN: [id number] Date of Birth: [birth year] Referring Provider: Erick Colace, MD   Encounter Date: 11/15/2017  PT End of Session - 11/15/17 1639    Visit Number  1    Number of Visits  13    Date for PT Re-Evaluation  12/27/17    Authorization Type  1/10 progress note    PT Start Time  1502    PT Stop Time  1601    PT Time Calculation (min)  59 min    Equipment Utilized During Treatment  Gait belt    Activity Tolerance  Patient tolerated treatment well    Behavior During Therapy  [facility] for tasks assessed/performed       Past Medical History:  Diagnosis Date  . Anxiety   . Asthma    MILD AS A CHILD  . Depression   . Deviated septum   . Dyspnea    NORMAL SPIROMETRY 02/01/17 VISIT WITH DR HEDRICK  . Hypertension   . Sleep apnea   . Stroke [facility])     Past Surgical History:  Procedure Laterality Date  . CRANIOTOMY Left 07/29/2017   Procedure: LEFT HEMI- CRANIECTOMY WITH PLACEMENT OF BONE FLAP IN ABDOMEN;  Surgeon: Lisbeth Renshaw, MD;  Location: [facility] OR;  Service: Neurosurgery;  Laterality: Left;  . IR ANGIO INTRA EXTRACRAN SEL COM CAROTID INNOMINATE UNI R MOD SED  07/28/2017  . IR ANGIO VERTEBRAL SEL VERTEBRAL UNI R MOD SED  07/28/2017  . IR CT HEAD LTD  07/28/2017  . IR PERCUTANEOUS ART THROMBECTOMY/INFUSION INTRACRANIAL INC DIAG ANGIO  07/28/2017  . NASAL SEPTOPLASTY W/ TURBINOPLASTY Bilateral 03/06/2017   Procedure: NASAL SEPTOPLASTY WITH TURBINATE REDUCTION;  Surgeon: Linus Salmons, MD;  Location: [facility] ORS;  Service: ENT;  Laterality: Bilateral;  . RADIOLOGY WITH ANESTHESIA N/A 07/27/2017   Procedure: RADIOLOGY WITH ANESTHESIA;  Surgeon: Julieanne Cotton, MD;  Location: [facility] OR;  Service: Radiology;  Laterality: N/A;    There were no vitals filed for this  visit.   Subjective Assessment - 11/15/17 1523    Subjective  Pt presents with weakness, imbalance, and functional limitations follow CVA    Patient is accompained by:  Family member [name], wife    Pertinent History  Pt is a 42 y/o M who presented on Jul 27, 2017 with R sided weakness, headache, slurred speech.  Cranial CT scan showed L insula and frontal operculum lucency, compatible with infarction.  Underwent attempted mechanical thrombectomy per Interventional Radiology that was unsuccessful, required intubation.  Followup MRI showed cerebellar edema, midline shift.  Underwent L decompressive hemicraniectomy, placement of bone flap in abdominal pocket Jan 13th, 2019.  Pt went to CIR at [facility] until 2/21 and then received HHPT until end of April. Pt reports occasional dull pain at bone flap site with worst pain reaching 5/10.  Pt normally wears R shoulder subluxation brace, not wearing on eval.  Pt requires assist with RLE for tub and car transfers.  Pt ind with showering, dressing.  Pt reports 1 fall at the end of March when he slid out of bed but no injury. Pt with botox scheduled for 11/19/17, pt reports tone causes R ankle to "curl in".   Pt has knee hyperextension brace but HHPT discharged this.  Pt has R wrist splint to wear at night.  Had a boot on R ankle when sleeping but HHPT said it was too big so HHPT d/c this.  Goals: improve independence with ambulation, to be able to assist with cooking, improve indepence with stairs (requires assist with and without rail). Pt with h/o DVT in LLE and small PE in March 2019.    Limitations  Walking;House hold activities unable to work    How long can you walk comfortably?  100 ft    Patient Stated Goals  see above    Currently in Pain?  No/denies    Multiple Pain Sites  No         [facility] PT Assessment - 11/15/17 1523      Assessment   Medical Diagnosis  Hemiplegia and hemiparesis following cerebral infarction affecting right dominant side     Referring Provider  Erick Colace, MD    Onset Date/Surgical Date  07/27/17    Hand Dominance  Left    Next MD Visit  Dr. Wynn Banker on 11/19/17    Prior Therapy  Yes, CIR and HHPT      Precautions   Precautions  Other (comment)    Precaution Comments  wears helmet s/p craniotomy with bone flap    Required Braces or Orthoses  Other Brace/Splint    Other Brace/Splint  wears R shoulder subluxation brace      Restrictions   Weight Bearing Restrictions  No      Balance Screen   Has the patient fallen in the past 6 months  Yes    How many times?  1    Has the patient had a decrease in activity level because of a fear of falling?   Yes    Is the patient reluctant to leave their home because of a fear of falling?   No      Home Environment   Living Environment  Private residence    Living Arrangements  Spouse/significant other;Children    Available Help at Discharge  Family;Available 24 hours/day wife not currently working and is available 24/7    Type of Home  Apartment    Home Access  Level entry    Home Layout  One level    Home Equipment  Walker - 2 wheels;Wheelchair - manual;Tub bench      Prior Function   Level of Independence  Independent    Vocation  Full time employment    Leisure  cooking      Cognition   Problem Solving  Impaired      Sensation   Light Touch  Impaired Detail    Light Touch Impaired Details  Absent RUE;Absent RLE    Stereognosis  Impaired Detail    Stereognosis Impaired Details  Absent RUE    Hot/Cold  Impaired Detail    Hot/Cold Impaired Details  Absent RUE    Proprioception  Impaired Detail    Proprioception Impaired Details  -- absent R great toe and ankle, diminished R knee    Additional Comments  Absent sharp/dull RLE      Tone   Assessment Location  Right Lower Extremity      ROM / Strength   AROM / PROM / Strength  Strength      AROM   Overall AROM   Deficits      Strength   Overall Strength  Deficits    Strength Assessment Site   Hip;Knee;Ankle    Right/Left Hip  Right;Left    Right Hip Flexion  0/5  Right Hip ABduction  0/5    Right Hip ADduction  0/5    Left Hip Flexion  5/5    Left Hip ABduction  5/5    Left Hip ADduction  5/5    Right/Left Knee  Right;Left    Left Knee Flexion  5/5    Left Knee Extension  5/5    Right/Left Ankle  Right;Left    Left Ankle Dorsiflexion  5/5        EXAMINATION   5xSTS: 18.70 pt bracing posterior BLEs against side of mat table for stability and pushes up with LUE   : 0.08 m/s   Reflexes: 4+ R patella and achilles, 2+ L patella and achilles   +Clonus with passive R DF   Posture: pt sacral sitting with rounded shoulders and thoracic kyphosis   Gait Analysis: dec stance time and weight shift RLE, pelvis rotated to R, trunk lean to the L with greater WBing through LUE on RW, wearing R AFO with absent R knee F and hip F with compensatory circumduction with poor foot clearance   Stand pivot transfer: Pt slides R foot on the floor when pivoting from chair to transfer chair and does not keep feet within BOS of RW   Sit<>stand transfer: Pt requires cues for L hand placement on steady surface for sit<>stand. Pt either has assist from this PT or uses his LUE to place R hand onto adaptive handle bar on RW before or after transfer. Pt with poorly controlled descent to sit. Pt braces posterior BLEs against side of mat table for stability when standing, minimal weight placed through RUE and RLE.          Objective measurements completed on examination: See above findings.              PT Education - 11/15/17 1638    Education provided  Yes    Education Details  POC, role of PT, instructed to practice safety with sit<>stand and stand pivot transfers    Person(s) Educated  Patient;Spouse    Methods  Explanation;Demonstration;Verbal cues    Comprehension  Verbalized understanding;Returned demonstration;Verbal cues required;Need further instruction        PT Short Term Goals - 11/16/17 0916      PT SHORT TERM GOAL #1   Title  Pt will independently complete HEP at least 4 days/wk for carryover between sessions`    Time  2    Period  Weeks    Status  New        PT Long Term Goals - 11/16/17 0931      PT LONG TERM GOAL #1   Title  Pt's 5xSTS will improve to equal or less than 13 seconds to demonstrate improved balance and strength    Baseline  18.70 seconds    Time  5    Period  Weeks    Status  New      PT LONG TERM GOAL #2   Title  Pt's will improve to at least 0.8 m/s to resemble a community ambulator    Baseline  0.08 m/s    Time  6    Period  Weeks    Status  New      PT LONG TERM GOAL #3   Title  Pt will be able to demonstrate safe technique with stand pivot transfer without cues or assist 5/5x for improved safety and independence    Baseline  see evaluation note, unsafe without cues  Time  4    Period  Weeks    Status  New      PT LONG TERM GOAL #4   Title  Pt will be able to stand and assist with cooking a full meal for improved QOL    Baseline  Unable    Time  5    Period  Weeks    Status  New             Plan - 11/15/17 1641    Clinical Impression Statement  Pt is a L handed M who had large L MCA stroke complicated by traumatic SAH, edema, herniation, and s/p craniotomy on 07/2017, right sided paralysis.  Pt presents with profound RLE weakness and impaired sensation.  He demonstrates poor safety awareness and problem solving skills when performing sit<>stand and stand pivot transfers.  His was 0.08 m/s which is well below the norm.  His 5xSTS test suggests BLE weakness and imbalance.  Pt demonstrates poor posture with sacral sitting and rounded shoulders, although pt able to self correct, limited by fatigue.  Pt will benefit from skilled PT interventions for improved safety and independence with all aspects of mobility.    History and Personal Factors relevant to plan of care:  (+) Pt has  made good progress with PT in inpatient rehab and HHPT since onset, pt with goal to improve independence, very supportive wife  (-) Involvement of multiple systems: sensation, tone, strength, balance, gait    Clinical Presentation  Stable    Clinical Presentation due to:  Pt's symptoms have not changed significantly since onset and are predictable in nature    Clinical Decision Making  Moderate    Rehab Potential  Good    PT Frequency  2x / week    PT Duration  6 weeks    PT Treatment/Interventions  ADLs/Self Care Home Management;Aquatic Therapy;Biofeedback;Cryotherapy;Electrical Stimulation;Iontophoresis /ml Dexamethasone;Moist Heat;Traction;Ultrasound;DME Instruction;Gait training;Stair training;Functional mobility training;Therapeutic activities;Therapeutic exercise;Balance training;Neuromuscular re-education;Cognitive remediation;Patient/family education;Orthotic Fit/Training;Wheelchair mobility training;Manual techniques;Passive range of motion;Dry needling;Energy conservation;Taping    PT Next Visit Plan  gait training, strengthening and balance exercises, safety with transfers    PT Home Exercise Plan  instructed in practicing safe technique for sit<>stand and stand pivot transfers    Recommended Other Services  none at this time, already receiving OT    Consulted and Agree with Plan of Care  Patient;Family member/caregiver    Family Member Consulted  wife       Patient will benefit from skilled therapeutic intervention in order to improve the following deficits and impairments:  Abnormal gait, Decreased activity tolerance, Decreased balance, Decreased cognition, Decreased coordination, Decreased endurance, Decreased knowledge of precautions, Decreased knowledge of use of DME, Decreased mobility, Decreased range of motion, Decreased safety awareness, Decreased strength, Difficulty walking, Hypomobility, Hypermobility, Increased muscle spasms, Impaired perceived functional ability, Impaired  flexibility, Impaired sensation, Impaired tone, Impaired UE functional use, Impaired vision/preception, Improper body mechanics, Postural dysfunction, Pain  Visit Diagnosis: Hemiplegia and hemiparesis following cerebral infarction affecting right non-dominant side (HCC)  Muscle weakness (generalized)  Other abnormalities of gait and mobility     Problem List Patient Active Problem List   Diagnosis Date Noted  . PE (pulmonary thromboembolism) (HCC) 10/06/2017  . DVT (deep vein thrombosis) in pregnancy (HCC) 10/06/2017  . Acute blood loss anemia   . Vascular headache   . Spastic hemiplegia of right dominant side as late effect of cerebral infarction (HCC)   . Benign essential HTN   . Frontal  lobe and executive function deficit following cerebral infarction   . Adjustment disorder with mixed anxiety and depressed mood   . 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 Aphasia due to acute stroke (HCC) 08/07/2017  . B12 deficiency   . Acute respiratory failure (HCC)   . Central line infiltration (HCC)   . Endotracheal tube present   . Fever   . Leukocytosis   . S/P craniotomy 07/30/2017  . Cerebral edema (HCC) 07/28/2017  . Hyperlipidemia 07/28/2017  . Stroke (cerebrum) (HCC) 07/27/2017    Encarnacion Chu PT, DPT 11/16/2017, 9:39 AM  [place] [facility] MAIN [facility] SERVICES [address] [place], [place], [address] Phone: [phone]   Fax:  [phone]  Name: Jeremy [name] MRN: [id number] Date of Birth: 03/17/1976

## 2017-11-19 ENCOUNTER — Ambulatory Visit: Payer: BLUE CROSS/BLUE SHIELD | Admitting: Physical Medicine & Rehabilitation

## 2017-11-19 ENCOUNTER — Encounter: Payer: Self-pay | Admitting: Physical Medicine & Rehabilitation

## 2017-11-19 ENCOUNTER — Encounter: Payer: BLUE CROSS/BLUE SHIELD | Attending: Registered Nurse

## 2017-11-19 VITALS — BP 128/92 | HR 92 | Ht 71.0 in | Wt 184.0 lb

## 2017-11-19 DIAGNOSIS — F329 Major depressive disorder, single episode, unspecified: Secondary | ICD-10-CM | POA: Insufficient documentation

## 2017-11-19 DIAGNOSIS — G441 Vascular headache, not elsewhere classified: Secondary | ICD-10-CM | POA: Insufficient documentation

## 2017-11-19 DIAGNOSIS — I10 Essential (primary) hypertension: Secondary | ICD-10-CM | POA: Insufficient documentation

## 2017-11-19 DIAGNOSIS — G4730 Sleep apnea, unspecified: Secondary | ICD-10-CM | POA: Diagnosis not present

## 2017-11-19 DIAGNOSIS — Z9889 Other specified postprocedural states: Secondary | ICD-10-CM | POA: Insufficient documentation

## 2017-11-19 DIAGNOSIS — I63512 Cerebral infarction due to unspecified occlusion or stenosis of left middle cerebral artery: Secondary | ICD-10-CM | POA: Insufficient documentation

## 2017-11-19 DIAGNOSIS — I69351 Hemiplegia and hemiparesis following cerebral infarction affecting right dominant side: Secondary | ICD-10-CM

## 2017-11-19 DIAGNOSIS — F419 Anxiety disorder, unspecified: Secondary | ICD-10-CM | POA: Insufficient documentation

## 2017-11-19 DIAGNOSIS — J45909 Unspecified asthma, uncomplicated: Secondary | ICD-10-CM | POA: Insufficient documentation

## 2017-11-19 NOTE — Patient Instructions (Signed)

## 2017-11-19 NOTE — Progress Notes (Signed)
  Botox Injection for spasticity using needle EMG guidance  Dilution: 50 Units/ml Indication: Severe spasticity which interferes with ADL,mobility and/or  hygiene and is unresponsive to medication management and other conservative care Informed consent was obtained after describing risks and benefits of the procedure with the patient. This includes bleeding, bruising, infection, excessive weakness, or medication side effects. A REMS form is on file and signed. Needle: 25g 2" needle electrode Number of units per muscle Right posterior tibialis 75 units Right medial gastroc 50 units Right lateral gastroc 50 units Right medial soleus 75 units Right lateral soleus 50 units   All injections were done after obtaining appropriate EMG activity and after negative drawback for blood. The patient tolerated the procedure well. Post procedure instructions were given. A followup appointment was made.

## 2017-11-20 ENCOUNTER — Ambulatory Visit: Payer: BLUE CROSS/BLUE SHIELD | Admitting: Occupational Therapy

## 2017-11-20 ENCOUNTER — Ambulatory Visit: Payer: BLUE CROSS/BLUE SHIELD | Admitting: Physical Therapy

## 2017-11-20 ENCOUNTER — Encounter: Payer: Self-pay | Admitting: Physical Therapy

## 2017-11-20 DIAGNOSIS — I69353 Hemiplegia and hemiparesis following cerebral infarction affecting right non-dominant side: Secondary | ICD-10-CM | POA: Diagnosis not present

## 2017-11-20 DIAGNOSIS — M6281 Muscle weakness (generalized): Secondary | ICD-10-CM

## 2017-11-20 DIAGNOSIS — R2689 Other abnormalities of gait and mobility: Secondary | ICD-10-CM

## 2017-11-20 DIAGNOSIS — R278 Other lack of coordination: Secondary | ICD-10-CM

## 2017-11-20 NOTE — Therapy (Signed)
[place] [facility] MAIN [facility] SERVICES [address] [place], [place], [address] Phone: [phone]   Fax:  [phone]  Physical Therapy Treatment  Patient Details  Name: Jeremy [name] MRN: [id number] Date of Birth: 1975/10/25 Referring Provider: Erick Colace, MD   Encounter Date: 11/20/2017  PT End of Session - 11/20/17 1608    Visit Number  2    Number of Visits  13    Date for PT Re-Evaluation  12/27/17    Authorization Type  2/10 progress note    PT Start Time  1608    PT Stop Time  1639    PT Time Calculation (min)  31 min    Equipment Utilized During Treatment  Gait belt    Activity Tolerance  Patient tolerated treatment well    Behavior During Therapy  [facility] for tasks assessed/performed       Past Medical History:  Diagnosis Date  . Anxiety   . Asthma    MILD AS A CHILD  . Depression   . Deviated septum   . Dyspnea    NORMAL SPIROMETRY 02/01/17 VISIT WITH DR HEDRICK  . Hypertension   . Sleep apnea   . Stroke [facility])     Past Surgical History:  Procedure Laterality Date  . CRANIOTOMY Left 07/29/2017   Procedure: LEFT HEMI- CRANIECTOMY WITH PLACEMENT OF BONE FLAP IN ABDOMEN;  Surgeon: Jeremy Renshaw, MD;  Location: [facility] OR;  Service: Neurosurgery;  Laterality: Left;  . IR ANGIO INTRA EXTRACRAN SEL COM CAROTID INNOMINATE UNI R MOD SED  07/28/2017  . IR ANGIO VERTEBRAL SEL VERTEBRAL UNI R MOD SED  07/28/2017  . IR CT HEAD LTD  07/28/2017  . IR PERCUTANEOUS ART THROMBECTOMY/INFUSION INTRACRANIAL INC DIAG ANGIO  07/28/2017  . NASAL SEPTOPLASTY W/ TURBINOPLASTY Bilateral 03/06/2017   Procedure: NASAL SEPTOPLASTY WITH TURBINATE REDUCTION;  Surgeon: Jeremy Salmons, MD;  Location: [facility] ORS;  Service: ENT;  Laterality: Bilateral;  . RADIOLOGY WITH ANESTHESIA N/A 07/27/2017   Procedure: RADIOLOGY WITH ANESTHESIA;  Surgeon: Jeremy Cotton, MD;  Location: [facility] OR;  Service: Radiology;  Laterality: N/A;    There were no vitals filed for this  visit.  Subjective Assessment - 11/20/17 1608    Subjective  Pt arrived late, limiting session.  Pt had botox injection to R calf yesterday.  Has not noticed a difference yet but MD said he might not notice a difference for 1 wk.  Pt has been practicing keeping his feet within BOS of RW when walking and transferring this past week.      Patient is accompained by:  Family member Jeremy [name], wife    Pertinent History  Pt is a 42 y/o M who presented on Jul 27, 2017 with R sided weakness, headache, slurred speech.  Cranial CT scan showed L insula and frontal operculum lucency, compatible with infarction.  Underwent attempted mechanical thrombectomy per Interventional Radiology that was unsuccessful, required intubation.  Followup MRI showed cerebellar edema, midline shift.  Underwent L decompressive hemicraniectomy, placement of bone flap in abdominal pocket Jan 13th, 2019.  Pt went to CIR at [facility] until 2/21 and then received HHPT until end of April. Pt reports occasional dull pain at bone flap site with worst pain reaching 5/10.  Pt normally wears R shoulder subluxation brace, not wearing on eval.  Pt requires assist with RLE for tub and car transfers.  Pt ind with showering, dressing.  Pt reports 1 fall at the end of March when he slid  out of bed but no injury. Pt with botox scheduled for 11/19/17, pt reports tone causes R ankle to "curl in".   Pt has knee hyperextension brace but HHPT discharged this.  Pt has R wrist splint to wear at night.  Had a boot on R ankle when sleeping but HHPT said it was too big so HHPT d/c this.  Goals: improve independence with ambulation, to be able to assist with cooking, improve indepence with stairs (requires assist with and without rail). Pt with h/o DVT in LLE and small PE in March 2019.    Limitations  Walking;House hold activities unable to work    How long can you walk comfortably?  100 ft    Patient Stated Goals  see above    Currently in Pain?  No/denies          TREATMENT   Stand pivot transfer: Pt with flexed posture and looking down at feet constantly. Pt shuffling R foot but does keep his feet within BOS of RW.   Sit<>stand transfer: Pt demonstrates very poor safety technique with sit<>stand transfer. On first attempt the pt stood without pushing from chair and lost his balance, requiring mod assist to prevent fall. On first attempt to stand he did not reach back for chair. Instructed pt in and demonstrated proper and safe technique and had pt practice x4. During this practice had pt use LUE to place hand onto brace support on RW and to bring R hand off brace support to sit.   Gait training: Pt ambulated 50 ft with BTB in place for DF, knee F, and hip F assist with improved foot clearance RLE. Pt does keep feet within BOS when turning but does push RW too far away from him. Cues provided for proper technique and safety.                        PT Education - 11/20/17 1608    Education provided  Yes    Education Details  Exercise technique; transfer technique    Person(s) Educated  Patient    Methods  Explanation;Demonstration;Verbal cues    Comprehension  Verbalized understanding;Returned demonstration;Verbal cues required;Need further instruction       PT Short Term Goals - 11/16/17 0916      PT SHORT TERM GOAL #1   Title  Pt will independently complete HEP at least 4 days/wk for carryover between sessions`    Time  2    Period  Weeks    Status  New        PT Long Term Goals - 11/16/17 0931      PT LONG TERM GOAL #1   Title  Pt's 5xSTS will improve to equal or less than 13 seconds to demonstrate improved balance and strength    Baseline  18.70 seconds    Time  5    Period  Weeks    Status  New      PT LONG TERM GOAL #2   Title  Pt's will improve to at least 0.8 m/s to resemble a community ambulator    Baseline  0.08 m/s    Time  6    Period  Weeks    Status  New      PT LONG TERM GOAL #3    Title  Pt will be able to demonstrate safe technique with stand pivot transfer without cues or assist 5/5x for improved safety and independence  Baseline  see evaluation note, unsafe without cues    Time  4    Period  Weeks    Status  New      PT LONG TERM GOAL #4   Title  Pt will be able to stand and assist with cooking a full meal for improved QOL    Baseline  Unable    Time  5    Period  Weeks    Status  New            Plan - 11/20/17 1615    Clinical Impression Statement  Pt demonstrates very poor safety technique with sit<>stand transfer upon assessment at start of session.  Had pt practice this after verbal cues and demonstration.  Pt's technique improved but does still require min verbal cues to remember to reach back for surface when sitting.  Trialed use of BTB for DF, knee F, and hip F assist with improved foot clearance.  Pt will benefit from continued skilled PT interventions for improved safety with transfers and indpendence with all aspects of mobility.     Rehab Potential  Good    PT Frequency  2x / week    PT Duration  6 weeks    PT Treatment/Interventions  ADLs/Self Care Home Management;Aquatic Therapy;Biofeedback;Cryotherapy;Electrical Stimulation;Iontophoresis /ml Dexamethasone;Moist Heat;Traction;Ultrasound;DME Instruction;Gait training;Stair training;Functional mobility training;Therapeutic activities;Therapeutic exercise;Balance training;Neuromuscular re-education;Cognitive remediation;Patient/family education;Orthotic Fit/Training;Wheelchair mobility training;Manual techniques;Passive range of motion;Dry needling;Energy conservation;Taping    PT Next Visit Plan  gait training, strengthening and balance exercises, safety with transfers    PT Home Exercise Plan  instructed in practicing safe technique for sit<>stand and stand pivot transfers    Consulted and Agree with Plan of Care  Patient;Family member/caregiver    Family Member Consulted  wife        Patient will benefit from skilled therapeutic intervention in order to improve the following deficits and impairments:  Abnormal gait, Decreased activity tolerance, Decreased balance, Decreased cognition, Decreased coordination, Decreased endurance, Decreased knowledge of precautions, Decreased knowledge of use of DME, Decreased mobility, Decreased range of motion, Decreased safety awareness, Decreased strength, Difficulty walking, Hypomobility, Hypermobility, Increased muscle spasms, Impaired perceived functional ability, Impaired flexibility, Impaired sensation, Impaired tone, Impaired UE functional use, Impaired vision/preception, Improper body mechanics, Postural dysfunction, Pain  Visit Diagnosis: Hemiplegia and hemiparesis following cerebral infarction affecting right non-dominant side (HCC)  Muscle weakness (generalized)  Other abnormalities of gait and mobility  Other lack of coordination     Problem List Patient Active Problem List   Diagnosis Date Noted  . PE (pulmonary thromboembolism) (HCC) 10/06/2017  . DVT (deep vein thrombosis) in pregnancy (HCC) 10/06/2017  . Acute blood loss anemia   . Vascular headache   . Spastic hemiplegia of right dominant side as late effect of cerebral infarction (HCC)   . Benign essential HTN   . Frontal lobe and executive function deficit following cerebral infarction   . Adjustment disorder with mixed anxiety and depressed mood   . 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 Aphasia due to acute stroke (HCC) 08/07/2017  . B12 deficiency   . Acute respiratory failure (HCC)   . Central line infiltration (HCC)   . Endotracheal tube present   . Fever   . Leukocytosis   . S/P craniotomy 07/30/2017  . Cerebral edema (HCC) 07/28/2017  . Hyperlipidemia 07/28/2017  . Stroke (cerebrum) (HCC) 07/27/2017    Encarnacion Chu PT, DPT 11/20/2017, 4:45 PM  [facility] MAIN [facility] SERVICES  [address] [place], [place], [address] Phone: [phone]   Fax:  [phone]  Name: Jeremy [name] MRN: [id number] Date of Birth: Mar 28, 1976

## 2017-11-21 ENCOUNTER — Encounter: Payer: Self-pay | Admitting: Occupational Therapy

## 2017-11-21 NOTE — Therapy (Signed)
[facility] MAIN [facility] SERVICES [address] [place], [place], [address] Phone: [phone]   Fax:  [phone]  Occupational Therapy Treatment  Patient Details  Name: Jeremy [name] MRN: [id number] Date of Birth: 12-Sep-1975 Referring Provider: Erick Colace, MD   Encounter Date: 11/20/2017  OT End of Session - 11/21/17 1149    Visit Number  2    Number of Visits  24    Date for OT Re-Evaluation  02/05/18    OT Start Time  1645    OT Stop Time  1730    OT Time Calculation (min)  45 min    Activity Tolerance  Patient tolerated treatment well    Behavior During Therapy  [facility] for tasks assessed/performed       Past Medical History:  Diagnosis Date  . Anxiety   . Asthma    MILD AS A CHILD  . Depression   . Deviated septum   . Dyspnea    NORMAL SPIROMETRY 02/01/17 VISIT WITH DR HEDRICK  . Hypertension   . Sleep apnea   . Stroke [facility])     Past Surgical History:  Procedure Laterality Date  . CRANIOTOMY Left 07/29/2017   Procedure: LEFT HEMI- CRANIECTOMY WITH PLACEMENT OF BONE FLAP IN ABDOMEN;  Surgeon: Lisbeth Renshaw, MD;  Location: [facility] OR;  Service: Neurosurgery;  Laterality: Left;  . IR ANGIO INTRA EXTRACRAN SEL COM CAROTID INNOMINATE UNI R MOD SED  07/28/2017  . IR ANGIO VERTEBRAL SEL VERTEBRAL UNI R MOD SED  07/28/2017  . IR CT HEAD LTD  07/28/2017  . IR PERCUTANEOUS ART THROMBECTOMY/INFUSION INTRACRANIAL INC DIAG ANGIO  07/28/2017  . NASAL SEPTOPLASTY W/ TURBINOPLASTY Bilateral 03/06/2017   Procedure: NASAL SEPTOPLASTY WITH TURBINATE REDUCTION;  Surgeon: Linus Salmons, MD;  Location: [facility] ORS;  Service: ENT;  Laterality: Bilateral;  . RADIOLOGY WITH ANESTHESIA N/A 07/27/2017   Procedure: RADIOLOGY WITH ANESTHESIA;  Surgeon: Julieanne Cotton, MD;  Location: [facility] OR;  Service: Radiology;  Laterality: N/A;    There were no vitals filed for this visit.  Subjective Assessment - 11/21/17 1148    Subjective   Pt. reports he is feeling  better.    Pertinent History  42 year old right-handed male with history of hypertension, on no antihypertensive medications, who presented and July 27, 2017, with right-sided weakness, headache, and slurred speech.  He lives with spouse and 4-year-old son, independent prior to admission.  Cranial CT scan showed left insula and frontal operculum lucency, compatible with infarction.  Echocardiogram with ejection fraction of 65%, no wall motion abnormalities, no PFO.  CT angiography of the head and neck showed a left M1 occlusion, intermediate left MCA distribution collateralization.  Underwent attempted mechanical thrombectomy per Interventional Radiology that was unsuccessful, required intubation.Followup MRI showed cerebellar edema, midline shift; placed on 3% saline.  Underwent left decompressive hemicraniectomy, placement of bone flap in abdominal pocket on July 29, 2017, per Dr. [name].    Patient Stated Goals  Patient would like to regain ability to take care of self.    Currently in Pain?  No/denies      OT TREATMENT    Neuro muscular re-education:  Pt. worked preparing the RUE for weigthtbearing, and proprioceptive input with support, and alignment at the humeral head, and elbow.   Therapeutic Exercise:  Pt. Tolerated ROM in all joint ranges of the RUE, and hand in supine, and sitting.   Manual Therapy:  Pt. Tolerated scapular mobilizations in elevation, depression, abduction, and rotation in preparation  for ROM.                        OT Education - 11/21/17 1149    Education provided  Yes    Education Details  UE ther. ex.    Person(s) Educated  Patient    Methods  Explanation;Demonstration;Verbal cues    Comprehension  Verbalized understanding;Returned demonstration;Verbal cues required;Need further instruction          OT Long Term Goals - 11/13/17 2155      OT LONG TERM GOAL #1   Title  Patient will demonstrate improved balance to perform  clothing negotiation with dressing/toileting with supervision only.     Baseline  min to mod assist to complete at eval    Time  8    Period  Weeks    Status  New    Target Date  01/08/18      OT LONG TERM GOAL #2   Title  Patient will demonstrate ability to manage shoulder brace with modified independence.    Baseline  unable at eval    Time  4    Period  Weeks    Status  New    Target Date  12/12/17      OT LONG TERM GOAL #3   Title  Patient will demonstrate trace movement of elbow extension with facilitation to work towards neuromuscular reeducation for active arm use on the right .    Baseline  no active movement at eval    Time  12    Period  Weeks    Status  New    Target Date  02/05/18      OT LONG TERM GOAL #4   Title  Patient will demonstate understanding of compensatory strategies to protect right UE from harm during engagement  in daily activities.     Baseline  sensation absent in right UE and at risk for injury.    Time  12    Period  Weeks    Status  New    Target Date  02/05/18      OT LONG TERM GOAL #5   Title  Patient will complete cutting meat one handed with modified independence with adaptive equipment as needed.     Baseline  unable at eval    Time  8    Period  Weeks    Status  New    Target Date  01/05/18            Plan - 11/21/17 1150    Clinical Impression Statement  Pt. continues to present with no initiation of active movement elicited in the RUE, and hand. Pt. tolreated session well, and continues to work on improving UE ROM, and facilitation of active muscle responses in order to increase engagement in basic ADL, and IADL tasks.      Occupational Profile and client history currently impacting functional performance  absent sensation in RUE from shoulder to digits, no active right UE movement, decreased right peripheral vision, has 14 week old infant at home, recent move from 3rd story apartment, unable to work, bone flap with helmet  precautions.    Occupational performance deficits (Please refer to evaluation for details):  ADL's;IADL's;Leisure;Work    Electrical engineer    Current Impairments/barriers affecting progress:  positive:  motivation, family support.  Negative:  young age, lack of sensation on the right for light touch, sharp/dull, deep pressure, hot/cold, processing delay cognitively  OT Frequency  2x / week    OT Duration  12 weeks    OT Treatment/Interventions  Self-care/ADL training;Electrical Stimulation;Therapeutic exercise;Visual/perceptual remediation/compensation;Moist Heat;Neuromuscular education;Patient/family education;Splinting;Building services engineer;Therapeutic activities;Balance training;Cryotherapy;Contrast Bath;DME and/or AE instruction;Manual Therapy;Passive range of motion;Cognitive remediation/compensation    Clinical Decision Making  Multiple treatment options, significant modification of task necessary    Consulted and Agree with Plan of Care  Patient;Family member/caregiver    Family Member Consulted  wife [name]       Patient will benefit from skilled therapeutic intervention in order to improve the following deficits and impairments:  Abnormal gait, Decreased cognition, Decreased knowledge of use of DME, Increased edema, Pain, Decreased coordination, Decreased mobility, Impaired sensation, Decreased activity tolerance, Decreased endurance, Decreased range of motion, Decreased strength, Impaired tone, Decreased balance, Decreased knowledge of precautions, Decreased safety awareness, Difficulty walking, Impaired UE functional use  Visit Diagnosis: Muscle weakness (generalized)    Problem List Patient Active Problem List   Diagnosis Date Noted  . PE (pulmonary thromboembolism) (HCC) 10/06/2017  . DVT (deep vein thrombosis) in pregnancy (HCC) 10/06/2017  . Acute blood loss anemia   . Vascular headache   . Spastic hemiplegia of right dominant side as late effect of cerebral  infarction (HCC)   . Benign essential HTN   . Frontal lobe and executive function deficit following cerebral infarction   . Adjustment disorder with mixed anxiety and depressed mood   . 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 Aphasia due to acute stroke (HCC) 08/07/2017  . B12 deficiency   . Acute respiratory failure (HCC)   . Central line infiltration (HCC)   . Endotracheal tube present   . Fever   . Leukocytosis   . S/P craniotomy 07/30/2017  . Cerebral edema (HCC) 07/28/2017  . Hyperlipidemia 07/28/2017  . Stroke (cerebrum) (HCC) 07/27/2017    Olegario Messier, MS, OTR/L 11/21/2017, 6:38 PM  [facility] MAIN [facility] SERVICES [address] [place], [place], [address] Phone: [phone]   Fax:  [phone]  Name: Jeremy [name] MRN: [id number] Date of Birth: Nov 22, 1975

## 2017-11-22 ENCOUNTER — Encounter: Payer: BLUE CROSS/BLUE SHIELD | Admitting: Occupational Therapy

## 2017-11-22 ENCOUNTER — Ambulatory Visit: Payer: BLUE CROSS/BLUE SHIELD

## 2017-11-27 ENCOUNTER — Ambulatory Visit: Payer: BLUE CROSS/BLUE SHIELD | Admitting: Physical Therapy

## 2017-11-27 ENCOUNTER — Encounter: Payer: Self-pay | Admitting: Physical Therapy

## 2017-11-27 ENCOUNTER — Other Ambulatory Visit: Payer: Self-pay

## 2017-11-27 ENCOUNTER — Encounter: Payer: Self-pay | Admitting: Occupational Therapy

## 2017-11-27 ENCOUNTER — Ambulatory Visit: Payer: BLUE CROSS/BLUE SHIELD | Admitting: Occupational Therapy

## 2017-11-27 ENCOUNTER — Telehealth: Payer: Self-pay

## 2017-11-27 DIAGNOSIS — R278 Other lack of coordination: Secondary | ICD-10-CM

## 2017-11-27 DIAGNOSIS — M6281 Muscle weakness (generalized): Secondary | ICD-10-CM

## 2017-11-27 DIAGNOSIS — I69353 Hemiplegia and hemiparesis following cerebral infarction affecting right non-dominant side: Secondary | ICD-10-CM | POA: Diagnosis not present

## 2017-11-27 DIAGNOSIS — R2689 Other abnormalities of gait and mobility: Secondary | ICD-10-CM

## 2017-11-27 NOTE — Therapy (Signed)
[facility] MAIN [facility] SERVICES [address] [place], [place], [address] Phone: [phone]   Fax:  [phone]  Occupational Therapy Treatment  Patient Details  Name: Jeremy [name] MRN: [id number] Date of Birth: 1976-01-29 Referring Provider: Erick Colace, MD   Encounter Date: 11/27/2017  OT End of Session - 11/27/17 1727    Visit Number  3    Number of Visits  24    Date for OT Re-Evaluation  02/05/18    OT Start Time  1430    OT Stop Time  1515    OT Time Calculation (min)  45 min    Activity Tolerance  Patient tolerated treatment well    Behavior During Therapy  [facility] for tasks assessed/performed       Past Medical History:  Diagnosis Date  . Anxiety   . Asthma    MILD AS A CHILD  . Depression   . Deviated septum   . Dyspnea    NORMAL SPIROMETRY 02/01/17 VISIT WITH DR HEDRICK  . Hypertension   . Sleep apnea   . Stroke [facility])     Past Surgical History:  Procedure Laterality Date  . CRANIOTOMY Left 07/29/2017   Procedure: LEFT HEMI- CRANIECTOMY WITH PLACEMENT OF BONE FLAP IN ABDOMEN;  Surgeon: Lisbeth Renshaw, MD;  Location: [facility] OR;  Service: Neurosurgery;  Laterality: Left;  . IR ANGIO INTRA EXTRACRAN SEL COM CAROTID INNOMINATE UNI R MOD SED  07/28/2017  . IR ANGIO VERTEBRAL SEL VERTEBRAL UNI R MOD SED  07/28/2017  . IR CT HEAD LTD  07/28/2017  . IR PERCUTANEOUS ART THROMBECTOMY/INFUSION INTRACRANIAL INC DIAG ANGIO  07/28/2017  . NASAL SEPTOPLASTY W/ TURBINOPLASTY Bilateral 03/06/2017   Procedure: NASAL SEPTOPLASTY WITH TURBINATE REDUCTION;  Surgeon: Linus Salmons, MD;  Location: [facility] ORS;  Service: ENT;  Laterality: Bilateral;  . RADIOLOGY WITH ANESTHESIA N/A 07/27/2017   Procedure: RADIOLOGY WITH ANESTHESIA;  Surgeon: Julieanne Cotton, MD;  Location: [facility] OR;  Service: Radiology;  Laterality: N/A;    There were no vitals filed for this visit.  Subjective Assessment - 11/27/17 1719    Subjective   Pt reports he is having a  good day.    Patient is accompained by:  Family member    Pertinent History  42 year old right-handed male with history of hypertension, on no antihypertensive medications, who presented and July 27, 2017, with right-sided weakness, headache, and slurred speech.  He lives with spouse and 62-year-old son, independent prior to admission.  Cranial CT scan showed left insula and frontal operculum lucency, compatible with infarction.  Echocardiogram with ejection fraction of 65%, no wall motion abnormalities, no PFO.  CT angiography of the head and neck showed a left M1 occlusion, intermediate left MCA distribution collateralization.  Underwent attempted mechanical thrombectomy per Interventional Radiology that was unsuccessful, required intubation.Followup MRI showed cerebellar edema, midline shift; placed on 3% saline.  Underwent left decompressive hemicraniectomy, placement of bone flap in abdominal pocket on July 29, 2017, per Dr. [name].    Patient Stated Goals  Patient would like to regain ability to take care of self.    Currently in Pain?  No/denies                   OT Treatments/Exercises (OP) - 11/27/17 1720      Neurological Re-education Exercises   Other Exercises 1  Pt seen with his wife and baby present.  Scapular mobilization in seated position as well as edema massage.  Reviewed techniques  with pt and his wife.  Isolated control exercises with RUE and hand supported on rubber ball on RLE.  He was able to elicit control 50% of time and used his L hand with cues to stabilize.  Rec doing these exercises at home with a ball in sitting and in L sidelying in bed.  Neuromuscular re-ed for RUE with minimal active movement and cues to not substitute with trunk.  Muscle tghtness noted in upper traps  bilaterally with elevation on R with good response to active stretching and trigger point release.              OT Education - 11/27/17 1727    Education provided  Yes     Education Details  HEP and stretches    Person(s) Educated  Patient;Spouse    Methods  Demonstration;Explanation;Verbal cues    Comprehension  Verbalized understanding;Returned demonstration          OT Long Term Goals - 11/13/17 2155      OT LONG TERM GOAL #1   Title  Patient will demonstrate improved balance to perform clothing negotiation with dressing/toileting with supervision only.     Baseline  min to mod assist to complete at eval    Time  8    Period  Weeks    Status  New    Target Date  01/08/18      OT LONG TERM GOAL #2   Title  Patient will demonstrate ability to manage shoulder brace with modified independence.    Baseline  unable at eval    Time  4    Period  Weeks    Status  New    Target Date  12/12/17      OT LONG TERM GOAL #3   Title  Patient will demonstrate trace movement of elbow extension with facilitation to work towards neuromuscular reeducation for active arm use on the right .    Baseline  no active movement at eval    Time  12    Period  Weeks    Status  New    Target Date  02/05/18      OT LONG TERM GOAL #4   Title  Patient will demonstate understanding of compensatory strategies to protect right UE from harm during engagement  in daily activities.     Baseline  sensation absent in right UE and at risk for injury.    Time  12    Period  Weeks    Status  New    Target Date  02/05/18      OT LONG TERM GOAL #5   Title  Patient will complete cutting meat one handed with modified independence with adaptive equipment as needed.     Baseline  unable at eval    Time  8    Period  Weeks    Status  New    Target Date  01/05/18            Plan - 11/27/17 1728    Clinical Impression Statement  Pt. continues to present with motivation to regain use of RUE and hand to help take care of his baby and 40 year old son at home.  No initiation of active movement elicited in the RUE, and hand. Pt. tolreated session well, and continues to work on  improving UE ROM, and faciliation of active muscle responses in order to increase engagement in basic ADL, and IADL tasks.      Occupational Profile  and client history currently impacting functional performance  absent sensation in RUE from shoulder to digits, no active right UE movement, decreased right peripheral vision, has 71 week old infant at home, recent move from 3rd story apartment, unable to work, bone flap with helmet precautions.    Occupational performance deficits (Please refer to evaluation for details):  ADL's;IADL's;Leisure;Work    Electrical engineer    Current Impairments/barriers affecting progress:  positive:  motivation, family support.  Negative:  young age, lack of sensation on the right for light touch, sharp/dull, deep pressure, hot/cold, processing delay cognitively    OT Frequency  2x / week    OT Duration  12 weeks    OT Treatment/Interventions  Self-care/ADL training;Electrical Stimulation;Therapeutic exercise;Visual/perceptual remediation/compensation;Moist Heat;Neuromuscular education;Patient/family education;Splinting;Building services engineer;Therapeutic activities;Balance training;Cryotherapy;Contrast Bath;DME and/or AE instruction;Manual Therapy;Passive range of motion;Cognitive remediation/compensation    Clinical Decision Making  Several treatment options, min-mod task modification necessary    Consulted and Agree with Plan of Care  Patient;Family member/caregiver    Family Member Consulted  wife Jeremy [name]       Patient will benefit from skilled therapeutic intervention in order to improve the following deficits and impairments:     Visit Diagnosis: Muscle weakness (generalized)  Hemiplegia and hemiparesis following cerebral infarction affecting right non-dominant side (HCC)  Other lack of coordination    Problem List Patient Active Problem List   Diagnosis Date Noted  . PE (pulmonary thromboembolism) (HCC) 10/06/2017  . DVT (deep vein thrombosis)  in pregnancy (HCC) 10/06/2017  . Acute blood loss anemia   . Vascular headache   . Spastic hemiplegia of right dominant side as late effect of cerebral infarction (HCC)   . Benign essential HTN   . Frontal lobe and executive function deficit following cerebral infarction   . Adjustment disorder with mixed anxiety and depressed mood   . 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 Aphasia due to acute stroke (HCC) 08/07/2017  . B12 deficiency   . Acute respiratory failure (HCC)   . Central line infiltration (HCC)   . Endotracheal tube present   . Fever   . Leukocytosis   . S/P craniotomy 07/30/2017  . Cerebral edema (HCC) 07/28/2017  . Hyperlipidemia 07/28/2017  . Stroke (cerebrum) [facility]) 07/27/2017   Susanne Borders, OTR/L ascom [phone] 11/27/17, 5:31 PM  Jeremy [name] 11/27/2017, 5:31 PM  [facility] MAIN [facility] SERVICES [address] [place], [place], [address] Phone: [phone]   Fax:  [phone]  Name: Jeremy [name] MRN: [id number] Date of Birth: 08/26/1975

## 2017-11-27 NOTE — Telephone Encounter (Signed)
Pt wife [name] called stating pt tried Gabapentin 300 mg for a week and it improved but not enough. Requesting 400 mg of Gabapentin.

## 2017-11-27 NOTE — Therapy (Signed)
[facility] MAIN [facility] SERVICES [address] [place], [place], [address] Phone: [phone]   Fax:  [phone]  Physical Therapy Treatment  Patient Details  Name: Jeremy [name] MRN: [id number] Date of Birth: [birth year] Referring Provider: Erick Colace, MD   Encounter Date: 11/27/2017  PT End of Session - 11/27/17 1516    Visit Number  3    Number of Visits  13    Date for PT Re-Evaluation  12/27/17    PT Start Time  1517    PT Stop Time  1559    PT Time Calculation (min)  42 min    Equipment Utilized During Treatment  Gait belt    Activity Tolerance  Patient tolerated treatment well    Behavior During Therapy  [facility] for tasks assessed/performed       Past Medical History:  Diagnosis Date  . Anxiety   . Asthma    MILD AS A CHILD  . Depression   . Deviated septum   . Dyspnea    NORMAL SPIROMETRY 02/01/17 VISIT WITH DR HEDRICK  . Hypertension   . Sleep apnea   . Stroke [facility])     Past Surgical History:  Procedure Laterality Date  . CRANIOTOMY Left 07/29/2017   Procedure: LEFT HEMI- CRANIECTOMY WITH PLACEMENT OF BONE FLAP IN ABDOMEN;  Surgeon: Lisbeth Renshaw, MD;  Location: [facility] OR;  Service: Neurosurgery;  Laterality: Left;  . IR ANGIO INTRA EXTRACRAN SEL COM CAROTID INNOMINATE UNI R MOD SED  07/28/2017  . IR ANGIO VERTEBRAL SEL VERTEBRAL UNI R MOD SED  07/28/2017  . IR CT HEAD LTD  07/28/2017  . IR PERCUTANEOUS ART THROMBECTOMY/INFUSION INTRACRANIAL INC DIAG ANGIO  07/28/2017  . NASAL SEPTOPLASTY W/ TURBINOPLASTY Bilateral 03/06/2017   Procedure: NASAL SEPTOPLASTY WITH TURBINATE REDUCTION;  Surgeon: Linus Salmons, MD;  Location: [facility] ORS;  Service: ENT;  Laterality: Bilateral;  . RADIOLOGY WITH ANESTHESIA N/A 07/27/2017   Procedure: RADIOLOGY WITH ANESTHESIA;  Surgeon: Julieanne Cotton, MD;  Location: [facility] OR;  Service: Radiology;  Laterality: N/A;    There were no vitals filed for this visit.  Subjective Assessment - 11/27/17  1521    Subjective  Pt has not had a chance to trial modifications to [facility] boot as he has been out of town. No new changes.      Patient is accompained by:  Family member [name], wife    Pertinent History  Pt is a 42 y/o M who presented on Jul 27, 2017 with R sided weakness, headache, slurred speech.  Cranial CT scan showed L insula and frontal operculum lucency, compatible with infarction.  Underwent attempted mechanical thrombectomy per Interventional Radiology that was unsuccessful, required intubation.  Followup MRI showed cerebellar edema, midline shift.  Underwent L decompressive hemicraniectomy, placement of bone flap in abdominal pocket Jan 13th, 2019.  Pt went to CIR at [facility] until 2/21 and then received HHPT until end of April. Pt reports occasional dull pain at bone flap site with worst pain reaching 5/10.  Pt normally wears R shoulder subluxation brace, not wearing on eval.  Pt requires assist with RLE for tub and car transfers.  Pt ind with showering, dressing.  Pt reports 1 fall at the end of March when he slid out of bed but no injury. Pt with botox scheduled for 11/19/17, pt reports tone causes R ankle to "curl in".   Pt has knee hyperextension brace but HHPT discharged this.  Pt has R wrist splint to  wear at night.  Had a boot on R ankle when sleeping but HHPT said it was too big so HHPT d/c this.  Goals: improve independence with ambulation, to be able to assist with cooking, improve indepence with stairs (requires assist with and without rail). Pt with h/o DVT in LLE and small PE in March 2019.    Limitations  Walking;House hold activities unable to work    How long can you walk comfortably?  100 ft    Patient Stated Goals  see above    Currently in Pain?  No/denies        TREATMENT   Sit<>Stand and stand pivot transfer with RW and pt placing R hand on brace on RW. Cues provided for safety and for RW management. x6   Supine Bil quad sets using motor imagery to encourage  activation of R quad. No quad activation appreciated. Performed x10   Bridges with assist to hold RLE in place 2x10   R SLR x10 with assist, pt able to initiate but requires assist for remainder of exercise   R hip Abd/ER with light manual resistance x10   Seated on airex pad with feet supported and reaching for cone with LUE across body and reaching overhead   Repeated with feet unsupported   R foot on ball and rolling weighted ball forward and back, in circles, counterclockwise and clockwise, with assist.   Practicing sit<>stand and stand pivot transfers x4.  Pt forgetful of reaching back when preparing to sit.                         PT Education - 11/27/17 1516    Education provided  Yes    Education Details  Exercise technique; safety with mobility    Person(s) Educated  Patient    Methods  Demonstration;Explanation;Verbal cues    Comprehension  Verbalized understanding;Returned demonstration;Verbal cues required;Need further instruction       PT Short Term Goals - 11/16/17 0916      PT SHORT TERM GOAL #1   Title  Pt will independently complete HEP at least 4 days/wk for carryover between sessions`    Time  2    Period  Weeks    Status  New        PT Long Term Goals - 11/16/17 0931      PT LONG TERM GOAL #1   Title  Pt's 5xSTS will improve to equal or less than 13 seconds to demonstrate improved balance and strength    Baseline  18.70 seconds    Time  5    Period  Weeks    Status  New      PT LONG TERM GOAL #2   Title  Pt's will improve to at least 0.8 m/s to resemble a community ambulator    Baseline  0.08 m/s    Time  6    Period  Weeks    Status  New      PT LONG TERM GOAL #3   Title  Pt will be able to demonstrate safe technique with stand pivot transfer without cues or assist 5/5x for improved safety and independence    Baseline  see evaluation note, unsafe without cues    Time  4    Period  Weeks    Status  New      PT  LONG TERM GOAL #4   Title  Pt will be able to stand and  assist with cooking a full meal for improved QOL    Baseline  Unable    Time  5    Period  Weeks    Status  New            Plan - 11/27/17 1522    Clinical Impression Statement  Pt initially demonstrating poor safety awareness with sit<>stand and stand pivot transfers and requires cues for hand placement and RW management as well as safety with brace for R hand.  Pt demonstrated hip F activation with SLR and mild gluteal strength with bridges.  Pt demonstrated instability sitting on airex and reaching for cones indicating decreased trunk stability.  Pt will benefit from continued skilled PT interventions for improved gait, safety with transfers, strength, and QOL.     Rehab Potential  Good    PT Frequency  2x / week    PT Duration  6 weeks    PT Treatment/Interventions  ADLs/Self Care Home Management;Aquatic Therapy;Biofeedback;Cryotherapy;Electrical Stimulation;Iontophoresis /ml Dexamethasone;Moist Heat;Traction;Ultrasound;DME Instruction;Gait training;Stair training;Functional mobility training;Therapeutic activities;Therapeutic exercise;Balance training;Neuromuscular re-education;Cognitive remediation;Patient/family education;Orthotic Fit/Training;Wheelchair mobility training;Manual techniques;Passive range of motion;Dry needling;Energy conservation;Taping    PT Next Visit Plan  gait training, strengthening and balance exercises, safety with transfers    PT Home Exercise Plan  instructed in practicing safe technique for sit<>stand and stand pivot transfers    Consulted and Agree with Plan of Care  Patient;Family member/caregiver    Family Member Consulted  wife       Patient will benefit from skilled therapeutic intervention in order to improve the following deficits and impairments:  Abnormal gait, Decreased activity tolerance, Decreased balance, Decreased cognition, Decreased coordination, Decreased endurance, Decreased  knowledge of precautions, Decreased knowledge of use of DME, Decreased mobility, Decreased range of motion, Decreased safety awareness, Decreased strength, Difficulty walking, Hypomobility, Hypermobility, Increased muscle spasms, Impaired perceived functional ability, Impaired flexibility, Impaired sensation, Impaired tone, Impaired UE functional use, Impaired vision/preception, Improper body mechanics, Postural dysfunction, Pain  Visit Diagnosis: Muscle weakness (generalized)  Hemiplegia and hemiparesis following cerebral infarction affecting right non-dominant side (HCC)  Other abnormalities of gait and mobility  Other lack of coordination     Problem List Patient Active Problem List   Diagnosis Date Noted  . PE (pulmonary thromboembolism) (HCC) 10/06/2017  . DVT (deep vein thrombosis) in pregnancy (HCC) 10/06/2017  . Acute blood loss anemia   . Vascular headache   . Spastic hemiplegia of right dominant side as late effect of cerebral infarction (HCC)   . Benign essential HTN   . Frontal lobe and executive function deficit following cerebral infarction   . Adjustment disorder with mixed anxiety and depressed mood   . 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 Aphasia due to acute stroke (HCC) 08/07/2017  . B12 deficiency   . Acute respiratory failure (HCC)   . Central line infiltration (HCC)   . Endotracheal tube present   . Fever   . Leukocytosis   . S/P craniotomy 07/30/2017  . Cerebral edema (HCC) 07/28/2017  . Hyperlipidemia 07/28/2017  . Stroke (cerebrum) (HCC) 07/27/2017    Encarnacion Chu PT, DPT 11/27/2017, 4:10 PM  [place] [facility] MAIN [facility] SERVICES [address] [place], [place], [address] Phone: [phone]   Fax:  [phone]  Name: Jeremy [name] MRN: [id number] Date of Birth: Jun 22, 1976

## 2017-11-27 NOTE — Telephone Encounter (Signed)
Please call in gabapentin 400 mg 3 times daily #90,1 refill

## 2017-11-28 MED ORDER — GABAPENTIN 400 MG PO CAPS: 400.0000 mg | ORAL_CAPSULE | Freq: Three times a day (TID) | ORAL | 1 refills | Status: DC

## 2017-11-28 NOTE — Telephone Encounter (Signed)
Medication ordered, patient's wife notified via voicemail

## 2017-12-04 ENCOUNTER — Ambulatory Visit: Payer: BLUE CROSS/BLUE SHIELD | Admitting: Occupational Therapy

## 2017-12-04 ENCOUNTER — Encounter: Payer: Self-pay | Admitting: Occupational Therapy

## 2017-12-04 ENCOUNTER — Ambulatory Visit: Payer: BLUE CROSS/BLUE SHIELD

## 2017-12-04 ENCOUNTER — Other Ambulatory Visit: Payer: Self-pay

## 2017-12-04 DIAGNOSIS — R278 Other lack of coordination: Secondary | ICD-10-CM

## 2017-12-04 DIAGNOSIS — R2689 Other abnormalities of gait and mobility: Secondary | ICD-10-CM

## 2017-12-04 DIAGNOSIS — I69353 Hemiplegia and hemiparesis following cerebral infarction affecting right non-dominant side: Secondary | ICD-10-CM

## 2017-12-04 DIAGNOSIS — M6281 Muscle weakness (generalized): Secondary | ICD-10-CM

## 2017-12-04 NOTE — Therapy (Signed)
[facility] MAIN [facility] SERVICES [address] [place], [place], [address] Phone: [phone]   Fax:  [phone]  Physical Therapy Treatment  Patient Details  Name: Jeremy [name] MRN: [id number] Date of Birth: 1976/06/02 Referring Provider: [place] Colace, MD   Encounter Date: 12/04/2017  PT End of Session - 12/04/17 1646    Visit Number  4    Number of Visits  13    Date for PT Re-Evaluation  12/27/17    PT Start Time  1517    PT Stop Time  1600    PT Time Calculation (min)  43 min    Equipment Utilized During Treatment  Gait belt    Activity Tolerance  Patient tolerated treatment well    Behavior During Therapy  [facility] for tasks assessed/performed       Past Medical History:  Diagnosis Date  . Anxiety   . Asthma    MILD AS A CHILD  . Depression   . Deviated septum   . Dyspnea    NORMAL SPIROMETRY 02/01/17 VISIT WITH DR HEDRICK  . Hypertension   . Sleep apnea   . Stroke [facility])     Past Surgical History:  Procedure Laterality Date  . CRANIOTOMY Left 07/29/2017   Procedure: LEFT HEMI- CRANIECTOMY WITH PLACEMENT OF BONE FLAP IN ABDOMEN;  Surgeon: Lisbeth Renshaw, MD;  Location: [facility] OR;  Service: Neurosurgery;  Laterality: Left;  . IR ANGIO INTRA EXTRACRAN SEL COM CAROTID INNOMINATE UNI R MOD SED  07/28/2017  . IR ANGIO VERTEBRAL SEL VERTEBRAL UNI R MOD SED  07/28/2017  . IR CT HEAD LTD  07/28/2017  . IR PERCUTANEOUS ART THROMBECTOMY/INFUSION INTRACRANIAL INC DIAG ANGIO  07/28/2017  . NASAL SEPTOPLASTY W/ TURBINOPLASTY Bilateral 03/06/2017   Procedure: NASAL SEPTOPLASTY WITH TURBINATE REDUCTION;  Surgeon: Linus Salmons, MD;  Location: [facility] ORS;  Service: ENT;  Laterality: Bilateral;  . RADIOLOGY WITH ANESTHESIA N/A 07/27/2017   Procedure: RADIOLOGY WITH ANESTHESIA;  Surgeon: Julieanne Cotton, MD;  Location: [facility] OR;  Service: Radiology;  Laterality: N/A;    There were no vitals filed for this visit.  Subjective Assessment - 12/04/17  1645    Subjective  Patient presents to therapy with wife and child. Reports one fall since last session when performing STS from bed. Was able to get up with wifes help. No injuries or pain from fall or currently.     Patient is accompained by:  Family member [name], wife    Pertinent History  Pt is a 42 y/o M who presented on Jul 27, 2017 with R sided weakness, headache, slurred speech.  Cranial CT scan showed L insula and frontal operculum lucency, compatible with infarction.  Underwent attempted mechanical thrombectomy per Interventional Radiology that was unsuccessful, required intubation.  Followup MRI showed cerebellar edema, midline shift.  Underwent L decompressive hemicraniectomy, placement of bone flap in abdominal pocket Jan 13th, 2019.  Pt went to CIR at [facility] until 2/21 and then received HHPT until end of April. Pt reports occasional dull pain at bone flap site with worst pain reaching 5/10.  Pt normally wears R shoulder subluxation brace, not wearing on eval.  Pt requires assist with RLE for tub and car transfers.  Pt ind with showering, dressing.  Pt reports 1 fall at the end of March when he slid out of bed but no injury. Pt with botox scheduled for 11/19/17, pt reports tone causes R ankle to "curl in".   Pt has knee hyperextension  brace but HHPT discharged this.  Pt has R wrist splint to wear at night.  Had a boot on R ankle when sleeping but HHPT said it was too big so HHPT d/c this.  Goals: improve independence with ambulation, to be able to assist with cooking, improve indepence with stairs (requires assist with and without rail). Pt with h/o DVT in LLE and small PE in March 2019.    Limitations  Walking;House hold activities unable to work    How long can you walk comfortably?  100 ft    Patient Stated Goals  see above    Currently in Pain?  No/denies       put gait belt high to avoid flap: put under arm pits  Safety with transfers TREATMENT    Sit<>Stand and stand pivot  transfer with RW and pt placing R hand on brace on RW. Cues provided for safety and for RW management. x6   Supine PNF PROM/AAROM 10x D1 D2   Supine Bil quad sets using motor imagery to encourage activation of R qua with tactcile cueing No quad activation appreciated. Performed x10    Bridges with assist to hold RLE in place 2x10    R SLR x10 with assist, pt able to initiate but requires assist for remainder of exercise   R SAQ with assist x 10 supine    R hip Abd/ER with light manual resistance x10    Seated on airex pad with feet supported and reaching for cone with LUE across body and reaching overhead    Repeated with feet unsupported    R foot on ball and rolling weighted ball forward and back, in circles, counterclockwise and clockwise, with assist.    Practicing sit<>stand and stand pivot transfers x5.  Pt forgetful of reaching back when preparing to sit.   Sit to stand with CGA from plinth table with green airex pad under LLE to promote weight shift onto RLE with Min A for weight shift.   Ambulate 8 ft with RW and CGA from plinth table to WC.      Pt. response to medical necessity: Patient will continue to benefit from skilled physical therapy for improved gait, safety with transfers, strength, and QOL.                      PT Education - 12/04/17 1646    Education provided  Yes    Education Details  exercise technique, manual, weight shift     Person(s) Educated  Patient;Spouse    Methods  Explanation;Demonstration;Verbal cues;Tactile cues    Comprehension  Verbalized understanding;Returned demonstration;Tactile cues required;Need further instruction       PT Short Term Goals - 11/16/17 0916      PT SHORT TERM GOAL #1   Title  Pt will independently complete HEP at least 4 days/wk for carryover between sessions`    Time  2    Period  Weeks    Status  New        PT Long Term Goals - 11/16/17 0931      PT LONG TERM GOAL #1   Title  Pt's  5xSTS will improve to equal or less than 13 seconds to demonstrate improved balance and strength    Baseline  18.70 seconds    Time  5    Period  Weeks    Status  New      PT LONG TERM GOAL #2   Title  Pt's  will improve to at least 0.8 m/s to resemble a community ambulator    Baseline  0.08 m/s    Time  6    Period  Weeks    Status  New      PT LONG TERM GOAL #3   Title  Pt will be able to demonstrate safe technique with stand pivot transfer without cues or assist 5/5x for improved safety and independence    Baseline  see evaluation note, unsafe without cues    Time  4    Period  Weeks    Status  New      PT LONG TERM GOAL #4   Title  Pt will be able to stand and assist with cooking a full meal for improved QOL    Baseline  Unable    Time  5    Period  Weeks    Status  New            Plan - 12/04/17 1648    Clinical Impression Statement  Patient demonstrated trace activation of R quadriceps with max tactile cueing/quick stretch. Use of green pad under LLE to promote weight shift to RLE was challenging to patient and resulted in increased fatigue. Min A required for equal weight shift at pelvis. Patient will continue to benefit from skilled physical therapy for improved gait, safety with transfers, strength, and QOL.     Rehab Potential  Good    PT Frequency  2x / week    PT Duration  6 weeks    PT Treatment/Interventions  ADLs/Self Care Home Management;Aquatic Therapy;Biofeedback;Cryotherapy;Electrical Stimulation;Iontophoresis /ml Dexamethasone;Moist Heat;Traction;Ultrasound;DME Instruction;Gait training;Stair training;Functional mobility training;Therapeutic activities;Therapeutic exercise;Balance training;Neuromuscular re-education;Cognitive remediation;Patient/family education;Orthotic Fit/Training;Wheelchair mobility training;Manual techniques;Passive range of motion;Dry needling;Energy conservation;Taping    PT Next Visit Plan  // bars balance and ambulation      PT Home Exercise Plan  instructed in practicing safe technique for sit<>stand and stand pivot transfers    Consulted and Agree with Plan of Care  Patient;Family member/caregiver    Family Member Consulted  wife       Patient will benefit from skilled therapeutic intervention in order to improve the following deficits and impairments:  Abnormal gait, Decreased activity tolerance, Decreased balance, Decreased cognition, Decreased coordination, Decreased endurance, Decreased knowledge of precautions, Decreased knowledge of use of DME, Decreased mobility, Decreased range of motion, Decreased safety awareness, Decreased strength, Difficulty walking, Hypomobility, Hypermobility, Increased muscle spasms, Impaired perceived functional ability, Impaired flexibility, Impaired sensation, Impaired tone, Impaired UE functional use, Impaired vision/preception, Improper body mechanics, Postural dysfunction, Pain  Visit Diagnosis: Muscle weakness (generalized)  Hemiplegia and hemiparesis following cerebral infarction affecting right non-dominant side (HCC)  Other lack of coordination  Other abnormalities of gait and mobility     Problem List Patient Active Problem List   Diagnosis Date Noted  . PE (pulmonary thromboembolism) (HCC) 10/06/2017  . DVT (deep vein thrombosis) in pregnancy (HCC) 10/06/2017  . Acute blood loss anemia   . Vascular headache   . Spastic hemiplegia of right dominant side as late effect of cerebral infarction (HCC)   . Benign essential HTN   . Frontal lobe and executive function deficit following cerebral infarction   . Adjustment disorder with mixed anxiety and depressed mood   . 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 Aphasia due to acute stroke (HCC) 08/07/2017  . B12 deficiency   . Acute respiratory failure (HCC)   . Central line infiltration (HCC)   . Endotracheal tube present   .  Fever   . Leukocytosis   . S/P craniotomy 07/30/2017  . Cerebral  edema (HCC) 07/28/2017  . Hyperlipidemia 07/28/2017  . Stroke (cerebrum) (HCC) 07/27/2017   Precious Bard, PT, DPT   12/04/2017, 4:49 PM  [place] [facility] MAIN [facility] SERVICES [address] [place], [place], [address] Phone: [phone]   Fax:  [phone]  Name: Calahan Pak MRN: [id number] Date of Birth: October 18, 1975

## 2017-12-04 NOTE — Therapy (Signed)
[facility] [facility] MAIN [facility] SERVICES [address] [place], [place], [address] Phone: [phone]   Fax:  [phone]  Occupational Therapy Treatment  Patient Details  Name: Jeremy [name] MRN: [id number] Date of Birth: [birth year] Referring Provider: Erick Colace, MD   Encounter Date: 12/04/2017  OT End of Session - 12/04/17 1601    Visit Number  4    Number of Visits  24    Date for OT Re-Evaluation  02/05/18    OT Start Time  1430    OT Stop Time  1515    OT Time Calculation (min)  45 min    Activity Tolerance  Patient tolerated treatment well    Behavior During Therapy  [facility] for tasks assessed/performed       Past Medical History:  Diagnosis Date  . Anxiety   . Asthma    MILD AS A CHILD  . Depression   . Deviated septum   . Dyspnea    NORMAL SPIROMETRY 02/01/17 VISIT WITH DR HEDRICK  . Hypertension   . Sleep apnea   . Stroke [facility])     Past Surgical History:  Procedure Laterality Date  . CRANIOTOMY Left 07/29/2017   Procedure: LEFT HEMI- CRANIECTOMY WITH PLACEMENT OF BONE FLAP IN ABDOMEN;  Surgeon: Lisbeth Renshaw, MD;  Location: [facility] OR;  Service: Neurosurgery;  Laterality: Left;  . IR ANGIO INTRA EXTRACRAN SEL COM CAROTID INNOMINATE UNI R MOD SED  07/28/2017  . IR ANGIO VERTEBRAL SEL VERTEBRAL UNI R MOD SED  07/28/2017  . IR CT HEAD LTD  07/28/2017  . IR PERCUTANEOUS ART THROMBECTOMY/INFUSION INTRACRANIAL INC DIAG ANGIO  07/28/2017  . NASAL SEPTOPLASTY W/ TURBINOPLASTY Bilateral 03/06/2017   Procedure: NASAL SEPTOPLASTY WITH TURBINATE REDUCTION;  Surgeon: Linus Salmons, MD;  Location: [facility] ORS;  Service: ENT;  Laterality: Bilateral;  . RADIOLOGY WITH ANESTHESIA N/A 07/27/2017   Procedure: RADIOLOGY WITH ANESTHESIA;  Surgeon: Julieanne Cotton, MD;  Location: [facility] OR;  Service: Radiology;  Laterality: N/A;    There were no vitals filed for this visit.  Subjective Assessment - 12/04/17 1557    Subjective   Pt reports he is having a  good day.    Patient is accompained by:  Family member    Pertinent History  42 year old right-handed male with history of hypertension, on no antihypertensive medications, who presented and July 27, 2017, with right-sided weakness, headache, and slurred speech.  He lives with spouse and 60-year-old son, independent prior to admission.  Cranial CT scan showed left insula and frontal operculum lucency, compatible with infarction.  Echocardiogram with ejection fraction of 65%, no wall motion abnormalities, no PFO.  CT angiography of the head and neck showed a left M1 occlusion, intermediate left MCA distribution collateralization.  Underwent attempted mechanical thrombectomy per Interventional Radiology that was unsuccessful, required intubation.Followup MRI showed cerebellar edema, midline shift; placed on 3% saline.  Underwent left decompressive hemicraniectomy, placement of bone flap in abdominal pocket on July 29, 2017, per Dr. [name].    Patient Stated Goals  Patient would like to regain ability to take care of self.    Currently in Pain?  No/denies                   OT Treatments/Exercises (OP) - 12/04/17 1557      Neurological Re-education Exercises   Other Exercises 1  Pt seen with his wife and baby.  He conitnues to need cues from his wife to answer questions and not mumble  or just nod head.  Scapular mobilization while seated in w/c with exercises in front of mirror to work on upright posture and not leaning forward with shoulders or flexing neck forward. He had a lot of tightness in shoulder muscles and responded well to trigger point release and stretching.     Other Exercises 2  Neuromuscular re-ed for RUE and hand with no active movement except shoulder flexion with support from table.  Rec he continue to practtice at home and use L hand to assist R hand.                    OT Long Term Goals - 11/13/17 2155      OT LONG TERM GOAL #1   Title  Patient will  demonstrate improved balance to perform clothing negotiation with dressing/toileting with supervision only.     Baseline  min to mod assist to complete at eval    Time  8    Period  Weeks    Status  New    Target Date  01/08/18      OT LONG TERM GOAL #2   Title  Patient will demonstrate ability to manage shoulder brace with modified independence.    Baseline  unable at eval    Time  4    Period  Weeks    Status  New    Target Date  12/12/17      OT LONG TERM GOAL #3   Title  Patient will demonstrate trace movement of elbow extension with facilitation to work towards neuromuscular reeducation for active arm use on the right .    Baseline  no active movement at eval    Time  12    Period  Weeks    Status  New    Target Date  02/05/18      OT LONG TERM GOAL #4   Title  Patient will demonstate understanding of compensatory strategies to protect right UE from harm during engagement  in daily activities.     Baseline  sensation absent in right UE and at risk for injury.    Time  12    Period  Weeks    Status  New    Target Date  02/05/18      OT LONG TERM GOAL #5   Title  Patient will complete cutting meat one handed with modified independence with adaptive equipment as needed.     Baseline  unable at eval    Time  8    Period  Weeks    Status  New    Target Date  01/05/18            Plan - 12/04/17 1601    Clinical Impression Statement  Pt. continues to present with motivation to regain use of RUE and hand to help take care of his baby and 2 year old son at home.  No initiation of active movement elicited in the RUE, and hand. Pt. tolreated session well, and continues to work on improving UE ROM, and faciliation of active muscle responses in order to increase engagement in basic ADL, and IADL tasks.      Occupational Profile and client history currently impacting functional performance  absent sensation in RUE from shoulder to digits, no active right UE movement, decreased  right peripheral vision, has 10 week old infant at home, recent move from 3rd story apartment, unable to work, bone flap with helmet precautions.    Occupational  performance deficits (Please refer to evaluation for details):  ADL's;IADL's;Leisure;Work    Electrical engineer    Current Impairments/barriers affecting progress:  positive:  motivation, family support.  Negative:  young age, lack of sensation on the right for light touch, sharp/dull, deep pressure, hot/cold, processing delay cognitively    OT Frequency  2x / week    OT Duration  12 weeks    OT Treatment/Interventions  Self-care/ADL training;Electrical Stimulation;Therapeutic exercise;Visual/perceptual remediation/compensation;Moist Heat;Neuromuscular education;Patient/family education;Splinting;Building services engineer;Therapeutic activities;Balance training;Cryotherapy;Contrast Bath;DME and/or AE instruction;Manual Therapy;Passive range of motion;Cognitive remediation/compensation    Clinical Decision Making  Several treatment options, min-mod task modification necessary    Consulted and Agree with Plan of Care  Patient;Family member/caregiver    Family Member Consulted  wife Melanie       Patient will benefit from skilled therapeutic intervention in order to improve the following deficits and impairments:  Abnormal gait, Decreased cognition, Decreased knowledge of use of DME, Increased edema, Pain, Decreased coordination, Decreased mobility, Impaired sensation, Decreased activity tolerance, Decreased endurance, Decreased range of motion, Decreased strength, Impaired tone, Decreased balance, Decreased knowledge of precautions, Decreased safety awareness, Difficulty walking, Impaired UE functional use  Visit Diagnosis: Muscle weakness (generalized)  Hemiplegia and hemiparesis following cerebral infarction affecting right non-dominant side (HCC)  Other lack of coordination    Problem List Patient Active Problem List    Diagnosis Date Noted  . PE (pulmonary thromboembolism) (HCC) 10/06/2017  . DVT (deep vein thrombosis) in pregnancy (HCC) 10/06/2017  . Acute blood loss anemia   . Vascular headache   . Spastic hemiplegia of right dominant side as late effect of cerebral infarction (HCC)   . Benign essential HTN   . Frontal lobe and executive function deficit following cerebral infarction   . Adjustment disorder with mixed anxiety and depressed mood   . 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 Aphasia due to acute stroke (HCC) 08/07/2017  . B12 deficiency   . Acute respiratory failure (HCC)   . Central line infiltration (HCC)   . Endotracheal tube present   . Fever   . Leukocytosis   . S/P craniotomy 07/30/2017  . Cerebral edema (HCC) 07/28/2017  . Hyperlipidemia 07/28/2017  . Stroke (cerebrum) [facility]) 07/27/2017    Susanne Borders, OTR/L ascom [phone] 12/04/17, 4:02 PM  [place] [facility] MAIN [facility] SERVICES [address] [place], [place], [address] Phone: [phone]   Fax:  [phone]  Name: Jeremy [name] MRN: [id number] Date of Birth: 02/02/1976

## 2017-12-06 ENCOUNTER — Ambulatory Visit: Payer: BLUE CROSS/BLUE SHIELD | Admitting: Occupational Therapy

## 2017-12-06 ENCOUNTER — Ambulatory Visit: Payer: BLUE CROSS/BLUE SHIELD

## 2017-12-06 ENCOUNTER — Encounter: Payer: Self-pay | Admitting: Occupational Therapy

## 2017-12-06 DIAGNOSIS — M6281 Muscle weakness (generalized): Secondary | ICD-10-CM

## 2017-12-06 DIAGNOSIS — R278 Other lack of coordination: Secondary | ICD-10-CM

## 2017-12-06 DIAGNOSIS — I69353 Hemiplegia and hemiparesis following cerebral infarction affecting right non-dominant side: Secondary | ICD-10-CM

## 2017-12-06 DIAGNOSIS — R2689 Other abnormalities of gait and mobility: Secondary | ICD-10-CM

## 2017-12-06 NOTE — Therapy (Signed)
[facility] [facility] MAIN [facility] SERVICES [address] [place], [place], [address] Phone: [phone]   Fax:  [phone]  Physical Therapy Treatment  Patient Details  Name: Jeremy [name] MRN: [id number] Date of Birth: 09/10/1975 Referring Provider: Erick Colace, MD   Encounter Date: 12/06/2017  PT End of Session - 12/06/17 1651    Visit Number  5    Number of Visits  13    Date for PT Re-Evaluation  12/27/17    Authorization Type  5/10 progress note?    PT Start Time  1601    PT Stop Time  1645    PT Time Calculation (min)  44 min    Equipment Utilized During Treatment  Gait belt    Activity Tolerance  Patient tolerated treatment well    Behavior During Therapy  WFL for tasks assessed/performed       Past Medical History:  Diagnosis Date  . Anxiety   . Asthma    MILD AS A CHILD  . Depression   . Deviated septum   . Dyspnea    NORMAL SPIROMETRY 02/01/17 VISIT WITH DR HEDRICK  . Hypertension   . Sleep apnea   . Stroke [facility])     Past Surgical History:  Procedure Laterality Date  . CRANIOTOMY Left 07/29/2017   Procedure: LEFT HEMI- CRANIECTOMY WITH PLACEMENT OF BONE FLAP IN ABDOMEN;  Surgeon: Lisbeth Renshaw, MD;  Location: [facility] OR;  Service: Neurosurgery;  Laterality: Left;  . IR ANGIO INTRA EXTRACRAN SEL COM CAROTID INNOMINATE UNI R MOD SED  07/28/2017  . IR ANGIO VERTEBRAL SEL VERTEBRAL UNI R MOD SED  07/28/2017  . IR CT HEAD LTD  07/28/2017  . IR PERCUTANEOUS ART THROMBECTOMY/INFUSION INTRACRANIAL INC DIAG ANGIO  07/28/2017  . NASAL SEPTOPLASTY W/ TURBINOPLASTY Bilateral 03/06/2017   Procedure: NASAL SEPTOPLASTY WITH TURBINATE REDUCTION;  Surgeon: Linus Salmons, MD;  Location: [facility] ORS;  Service: ENT;  Laterality: Bilateral;  . RADIOLOGY WITH ANESTHESIA N/A 07/27/2017   Procedure: RADIOLOGY WITH ANESTHESIA;  Surgeon: Julieanne Cotton, MD;  Location: [facility] OR;  Service: Radiology;  Laterality: N/A;    There were no vitals filed for this  visit.  Subjective Assessment - 12/06/17 1650    Subjective  Patient and wife are present at therapy. State upon questioning of home health exercises that they were potentially going to switch to quad cane with ambulation. No pain or falls reported since last session.     Patient is accompained by:  Family member [name], wife    Pertinent History  Pt is a 42 y/o M who presented on Jul 27, 2017 with R sided weakness, headache, slurred speech.  Cranial CT scan showed L insula and frontal operculum lucency, compatible with infarction.  Underwent attempted mechanical thrombectomy per Interventional Radiology that was unsuccessful, required intubation.  Followup MRI showed cerebellar edema, midline shift.  Underwent L decompressive hemicraniectomy, placement of bone flap in abdominal pocket Jan 13th, 2019.  Pt went to CIR at [facility] until 2/21 and then received HHPT until end of April. Pt reports occasional dull pain at bone flap site with worst pain reaching 5/10.  Pt normally wears R shoulder subluxation brace, not wearing on eval.  Pt requires assist with RLE for tub and car transfers.  Pt ind with showering, dressing.  Pt reports 1 fall at the end of March when he slid out of bed but no injury. Pt with botox scheduled for 11/19/17, pt reports tone causes R ankle to "  curl in".   Pt has knee hyperextension brace but HHPT discharged this.  Pt has R wrist splint to wear at night.  Had a boot on R ankle when sleeping but HHPT said it was too big so HHPT d/c this.  Goals: improve independence with ambulation, to be able to assist with cooking, improve indepence with stairs (requires assist with and without rail). Pt with h/o DVT in LLE and small PE in March 2019.    Limitations  Walking;House hold activities unable to work    How long can you walk comfortably?  100 ft    Patient Stated Goals  see above    Currently in Pain?  No/denies       Airex pad: static stand no UE support 60 seconds; tactile cues to  weight shift to L ; CGA  Static stand with horizontal head turns no LOB; CGA  Walking in // bars with SUE support  Standing marches in // bars ; CGA  Basketball throw static balance with no UE support. 2x15 balls no LOB ; CGA   Speed ladder: single UE support one foot in each box, excessive hyperextension of R knee; CGA  Ambulating with QC in // bars 2x length of bars, occasional scissoring of legs; CGA  Ambulate with QC and CGA 20 ft x2 trials with one turn both sets. ; CGA cues for weight acceptance and widening BOS.    Green cushion under LLE to promote weight shift to RLE, Mod A to hips for weight shift with Min -Mod A to prevent R knee buckling from fatigue 2 minutes.   tic tac toe 2 games, static stand without UE support no LOB, CGA                   PT Education - 12/06/17 1650    Education provided  Yes    Education Details  exercise technique, weight shift, ambulation with quad cane.     Person(s) Educated  Spouse;Patient    Methods  Explanation;Demonstration;Verbal cues;Tactile cues    Comprehension  Verbalized understanding;Returned demonstration       PT Short Term Goals - 11/16/17 0916      PT SHORT TERM GOAL #1   Title  Pt will independently complete HEP at least 4 days/wk for carryover between sessions`    Time  2    Period  Weeks    Status  New        PT Long Term Goals - 11/16/17 0931      PT LONG TERM GOAL #1   Title  Pt's 5xSTS will improve to equal or less than 13 seconds to demonstrate improved balance and strength    Baseline  18.70 seconds    Time  5    Period  Weeks    Status  New      PT LONG TERM GOAL #2   Title  Pt's will improve to at least 0.8 m/s to resemble a community ambulator    Baseline  0.08 m/s    Time  6    Period  Weeks    Status  New      PT LONG TERM GOAL #3   Title  Pt will be able to demonstrate safe technique with stand pivot transfer without cues or assist 5/5x for improved safety  and independence    Baseline  see evaluation note, unsafe without cues    Time  4    Period  Weeks  Status  New      PT LONG TERM GOAL #4   Title  Pt will be able to stand and assist with cooking a full meal for improved QOL    Baseline  Unable    Time  5    Period  Weeks    Status  New            Plan - 12/06/17 1653    Clinical Impression Statement  Patient will benefit from progressing to quad cane for ambulatory needs at this time. Occasional hyperextension of RLE noted with fatigue resulting in decreased weight shift onto affected limb. Static and dynamic stability on stable surfaces are functional for ADL performance. Patient requires verbal cues for scanning room for environment negotiation. Patient will continue to benefit from skilled physical therapy for improved gait, safety with transfers, strength and QOL.     Rehab Potential  Good    PT Frequency  2x / week    PT Duration  6 weeks    PT Treatment/Interventions  ADLs/Self Care Home Management;Aquatic Therapy;Biofeedback;Cryotherapy;Electrical Stimulation;Iontophoresis /ml Dexamethasone;Moist Heat;Traction;Ultrasound;DME Instruction;Gait training;Stair training;Functional mobility training;Therapeutic activities;Therapeutic exercise;Balance training;Neuromuscular re-education;Cognitive remediation;Patient/family education;Orthotic Fit/Training;Wheelchair mobility training;Manual techniques;Passive range of motion;Dry needling;Energy conservation;Taping    PT Next Visit Plan  // bars balance and ambulation     PT Home Exercise Plan  instructed in practicing safe technique for sit<>stand and stand pivot transfers    Consulted and Agree with Plan of Care  Patient;Family member/caregiver    Family Member Consulted  wife       Patient will benefit from skilled therapeutic intervention in order to improve the following deficits and impairments:  Abnormal gait, Decreased activity tolerance, Decreased balance, Decreased  cognition, Decreased coordination, Decreased endurance, Decreased knowledge of precautions, Decreased knowledge of use of DME, Decreased mobility, Decreased range of motion, Decreased safety awareness, Decreased strength, Difficulty walking, Hypomobility, Hypermobility, Increased muscle spasms, Impaired perceived functional ability, Impaired flexibility, Impaired sensation, Impaired tone, Impaired UE functional use, Impaired vision/preception, Improper body mechanics, Postural dysfunction, Pain  Visit Diagnosis: Muscle weakness (generalized)  Hemiplegia and hemiparesis following cerebral infarction affecting right non-dominant side (HCC)  Other lack of coordination  Other abnormalities of gait and mobility     Problem List Patient Active Problem List   Diagnosis Date Noted  . PE (pulmonary thromboembolism) (HCC) 10/06/2017  . DVT (deep vein thrombosis) in pregnancy (HCC) 10/06/2017  . Acute blood loss anemia   . Vascular headache   . Spastic hemiplegia of right dominant side as late effect of cerebral infarction (HCC)   . Benign essential HTN   . Frontal lobe and executive function deficit following cerebral infarction   . Adjustment disorder with mixed anxiety and depressed mood   . 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 Aphasia due to acute stroke (HCC) 08/07/2017  . B12 deficiency   . Acute respiratory failure (HCC)   . Central line infiltration (HCC)   . Endotracheal tube present   . Fever   . Leukocytosis   . S/P craniotomy 07/30/2017  . Cerebral edema (HCC) 07/28/2017  . Hyperlipidemia 07/28/2017  . Stroke (cerebrum) (HCC) 07/27/2017   Precious Bard, PT, DPT   12/06/2017, 4:54 PM  [place] [facility] MAIN [facility] SERVICES [address] [place], [place], [address] Phone: [phone]   Fax:  [phone]  Name: Calder Oblinger MRN: [id number] Date of Birth: [birth year]

## 2017-12-06 NOTE — Therapy (Signed)
[facility] MAIN [name] SERVICES [address] [place], [place], [address] Phone: [phone]   Fax:  [phone]  Occupational Therapy Treatment  Patient Details  Name: Jeremy [name] MRN: [id number] Date of Birth: [birth year] Referring Provider: Erick Colace, MD   Encounter Date: 12/06/2017  OT End of Session - 12/06/17 1746    Visit Number  5    Number of Visits  24    Date for OT Re-Evaluation  02/05/18    OT Start Time  1645    OT Stop Time  1730    OT Time Calculation (min)  45 min    Activity Tolerance  Patient tolerated treatment well    Behavior During Therapy  [facility] for tasks assessed/performed       Past Medical History:  Diagnosis Date  . Anxiety   . Asthma    MILD AS A CHILD  . Depression   . Deviated septum   . Dyspnea    NORMAL SPIROMETRY 02/01/17 VISIT WITH DR HEDRICK  . Hypertension   . Sleep apnea   . Stroke [facility])     Past Surgical History:  Procedure Laterality Date  . CRANIOTOMY Left 07/29/2017   Procedure: LEFT HEMI- CRANIECTOMY WITH PLACEMENT OF BONE FLAP IN ABDOMEN;  Surgeon: Lisbeth Renshaw, MD;  Location: [facility] OR;  Service: Neurosurgery;  Laterality: Left;  . IR ANGIO INTRA EXTRACRAN SEL COM CAROTID INNOMINATE UNI R MOD SED  07/28/2017  . IR ANGIO VERTEBRAL SEL VERTEBRAL UNI R MOD SED  07/28/2017  . IR CT HEAD LTD  07/28/2017  . IR PERCUTANEOUS ART THROMBECTOMY/INFUSION INTRACRANIAL INC DIAG ANGIO  07/28/2017  . NASAL SEPTOPLASTY W/ TURBINOPLASTY Bilateral 03/06/2017   Procedure: NASAL SEPTOPLASTY WITH TURBINATE REDUCTION;  Surgeon: Linus Salmons, MD;  Location: [facility] ORS;  Service: ENT;  Laterality: Bilateral;  . RADIOLOGY WITH ANESTHESIA N/A 07/27/2017   Procedure: RADIOLOGY WITH ANESTHESIA;  Surgeon: Julieanne Cotton, MD;  Location: [facility] OR;  Service: Radiology;  Laterality: N/A;    There were no vitals filed for this visit.  Subjective Assessment - 12/06/17 1647    Subjective   Pt. reports that he went  to [name] for his sister-in-law's baby gender reveal party.    Patient is accompained by:  Family member    Pertinent History  42 year old right-handed male with history of hypertension, on no antihypertensive medications, who presented and July 27, 2017, with right-sided weakness, headache, and slurred speech.  He lives with spouse and 37-year-old son, independent prior to admission.  Cranial CT scan showed left insula and frontal operculum lucency, compatible with infarction.  Echocardiogram with ejection fraction of 65%, no wall motion abnormalities, no PFO.  CT angiography of the head and neck showed a left M1 occlusion, intermediate left MCA distribution collateralization.  Underwent attempted mechanical thrombectomy per Interventional Radiology that was unsuccessful, required intubation.Followup MRI showed cerebellar edema, midline shift; placed on 3% saline.  Underwent left decompressive hemicraniectomy, placement of bone flap in abdominal pocket on July 29, 2017, per Dr. [name].    Patient Stated Goals  Patient would like to regain ability to take care of self.    Currently in Pain?  No/denies      OT TREATMENT    Neuro muscular re-education:  Pt. worked on weightbearing through the RUE, and hand with support and alignment of humeral head at the axilla.  Therapeutic Exercise:  Pt. Tolerated PROM in all joint ranges of motion in the RUE, and hand.  Manual Therapy:  Pt. Tolerated scapular mobilizations for scapular elevation, depression, abduction, and rotation. Pt. Tolerated retrograde massage for edema control secondary to increased edema in the right hand. Pt. Tolerated carpal rolls, and metacarpal spread stretches to prepare the RUE for ROM.                        OT Education - 12/06/17 1745    Education provided  Yes    Education Details  UE ther. ex.    Person(s) Educated  Patient;Spouse    Methods  Explanation;Demonstration;Tactile cues;Verbal  cues    Comprehension  Verbalized understanding;Returned demonstration          OT Long Term Goals - 11/13/17 2155      OT LONG TERM GOAL #1   Title  Patient will demonstrate improved balance to perform clothing negotiation with dressing/toileting with supervision only.     Baseline  min to mod assist to complete at eval    Time  8    Period  Weeks    Status  New    Target Date  01/08/18      OT LONG TERM GOAL #2   Title  Patient will demonstrate ability to manage shoulder brace with modified independence.    Baseline  unable at eval    Time  4    Period  Weeks    Status  New    Target Date  12/12/17      OT LONG TERM GOAL #3   Title  Patient will demonstrate trace movement of elbow extension with facilitation to work towards neuromuscular reeducation for active arm use on the right .    Baseline  no active movement at eval    Time  12    Period  Weeks    Status  New    Target Date  02/05/18      OT LONG TERM GOAL #4   Title  Patient will demonstate understanding of compensatory strategies to protect right UE from harm during engagement  in daily activities.     Baseline  sensation absent in right UE and at risk for injury.    Time  12    Period  Weeks    Status  New    Target Date  02/05/18      OT LONG TERM GOAL #5   Title  Patient will complete cutting meat one handed with modified independence with adaptive equipment as needed.     Baseline  unable at eval    Time  8    Period  Weeks    Status  New    Target Date  01/05/18            Plan - 12/06/17 1746    Clinical Impression Statement  Pt. reports he is now fixing his own lunch at home. Pt. presents with increased edema in in the RUE, and hand. No consistent active movement was elicited today in the RUE, and hand. Pt. continues to work on normalizing tone, and increasing independence with ADLs, and IADLs.     Occupational Profile and client history currently impacting functional performance  absent  sensation in RUE from shoulder to digits, no active right UE movement, decreased right peripheral vision, has 52 week old infant at home, recent move from 3rd story apartment, unable to work, bone flap with helmet precautions.    Occupational performance deficits (Please refer to evaluation for details):  ADL's;IADL's;Leisure;Work  Rehab Potential  Fair    Current Impairments/barriers affecting progress:  positive:  motivation, family support.  Negative:  young age, lack of sensation on the right for light touch, sharp/dull, deep pressure, hot/cold, processing delay cognitively    OT Frequency  2x / week    OT Duration  12 weeks    OT Treatment/Interventions  Self-care/ADL training;Electrical Stimulation;Therapeutic exercise;Visual/perceptual remediation/compensation;Moist Heat;Neuromuscular education;Patient/family education;Splinting;Building services engineer;Therapeutic activities;Balance training;Cryotherapy;Contrast Bath;DME and/or AE instruction;Manual Therapy;Passive range of motion;Cognitive remediation/compensation    Clinical Decision Making  Several treatment options, min-mod task modification necessary    Consulted and Agree with Plan of Care  Patient;Family member/caregiver    Family Member Consulted  wife [name]       Patient will benefit from skilled therapeutic intervention in order to improve the following deficits and impairments:  Abnormal gait, Decreased cognition, Decreased knowledge of use of DME, Increased edema, Pain, Decreased coordination, Decreased mobility, Impaired sensation, Decreased activity tolerance, Decreased endurance, Decreased range of motion, Decreased strength, Impaired tone, Decreased balance, Decreased knowledge of precautions, Decreased safety awareness, Difficulty walking, Impaired UE functional use  Visit Diagnosis: Muscle weakness (generalized)    Problem List Patient Active Problem List   Diagnosis Date Noted  . PE (pulmonary thromboembolism)  (HCC) 10/06/2017  . DVT (deep vein thrombosis) in pregnancy (HCC) 10/06/2017  . Acute blood loss anemia   . Vascular headache   . Spastic hemiplegia of right dominant side as late effect of cerebral infarction (HCC)   . Benign essential HTN   . Frontal lobe and executive function deficit following cerebral infarction   . Adjustment disorder with mixed anxiety and depressed mood   . 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 Aphasia due to acute stroke (HCC) 08/07/2017  . B12 deficiency   . Acute respiratory failure (HCC)   . Central line infiltration (HCC)   . Endotracheal tube present   . Fever   . Leukocytosis   . S/P craniotomy 07/30/2017  . Cerebral edema (HCC) 07/28/2017  . Hyperlipidemia 07/28/2017  . Stroke (cerebrum) (HCC) 07/27/2017    Olegario Messier, MS, OTR/L 12/06/2017, 5:53 PM  [place] [facility] MAIN [facility] SERVICES [address] [place], [place], [address] Phone: [phone]   Fax:  [phone]  Name: Havard Radigan MRN: [id number] Date of Birth: 1976-02-07

## 2017-12-13 ENCOUNTER — Encounter: Payer: Self-pay | Admitting: Physical Therapy

## 2017-12-13 ENCOUNTER — Ambulatory Visit: Payer: BLUE CROSS/BLUE SHIELD | Admitting: Physical Therapy

## 2017-12-13 DIAGNOSIS — I69353 Hemiplegia and hemiparesis following cerebral infarction affecting right non-dominant side: Secondary | ICD-10-CM | POA: Diagnosis not present

## 2017-12-13 DIAGNOSIS — R278 Other lack of coordination: Secondary | ICD-10-CM

## 2017-12-13 DIAGNOSIS — R2689 Other abnormalities of gait and mobility: Secondary | ICD-10-CM

## 2017-12-13 DIAGNOSIS — M6281 Muscle weakness (generalized): Secondary | ICD-10-CM

## 2017-12-13 NOTE — Therapy (Signed)
[facility] [place] [facility] MAIN [facility] SERVICES [address] [place], [place], [address] Phone: [phone]   Fax:  [phone]  Physical Therapy Treatment  Patient Details  Name: Jeremy [name] MRN: [id number] Date of Birth: 04/11/1976 Referring Provider: Erick Colace, MD   Encounter Date: 12/13/2017  PT End of Session - 12/13/17 1543    Visit Number  6    Number of Visits  13    Date for PT Re-Evaluation  12/27/17    Authorization Type  6/10 progress note?    PT Start Time  1535    PT Stop Time  1612    PT Time Calculation (min)  37 min    Equipment Utilized During Treatment  Gait belt    Activity Tolerance  Patient tolerated treatment well    Behavior During Therapy  WFL for tasks assessed/performed       Past Medical History:  Diagnosis Date  . Anxiety   . Asthma    MILD AS A CHILD  . Depression   . Deviated septum   . Dyspnea    NORMAL SPIROMETRY 02/01/17 VISIT WITH DR HEDRICK  . Hypertension   . Sleep apnea   . Stroke [facility])     Past Surgical History:  Procedure Laterality Date  . CRANIOTOMY Left 07/29/2017   Procedure: LEFT HEMI- CRANIECTOMY WITH PLACEMENT OF BONE FLAP IN ABDOMEN;  Surgeon: Lisbeth Renshaw, MD;  Location: [facility] OR;  Service: Neurosurgery;  Laterality: Left;  . IR ANGIO INTRA EXTRACRAN SEL COM CAROTID INNOMINATE UNI R MOD SED  07/28/2017  . IR ANGIO VERTEBRAL SEL VERTEBRAL UNI R MOD SED  07/28/2017  . IR CT HEAD LTD  07/28/2017  . IR PERCUTANEOUS ART THROMBECTOMY/INFUSION INTRACRANIAL INC DIAG ANGIO  07/28/2017  . NASAL SEPTOPLASTY W/ TURBINOPLASTY Bilateral 03/06/2017   Procedure: NASAL SEPTOPLASTY WITH TURBINATE REDUCTION;  Surgeon: Linus Salmons, MD;  Location: [facility] ORS;  Service: ENT;  Laterality: Bilateral;  . RADIOLOGY WITH ANESTHESIA N/A 07/27/2017   Procedure: RADIOLOGY WITH ANESTHESIA;  Surgeon: Julieanne Cotton, MD;  Location: [facility] OR;  Service: Radiology;  Laterality: N/A;    There were no vitals filed for this  visit.  Subjective Assessment - 12/13/17 1535    Subjective  Pt reports he is doing well.  This PT notified that pt only has one more session thus this PT asked the pt and wife what is most important thing that pt wants to work on moving forward.  Pt's wife expressed desire to practice supine<>sit getting out of bed to the R as he has changed his orientation in bed to be closer to the bathroom.  Pt fell out of his bed today when transitioning from supine>sit and landed on his R side with small mild abrasion at R forearm distal to elbow.  Pt reports he did hit his head on the R side and did not have his helmet on but he is not having any pain and his head feels fine, wife confirms this.      Patient is accompained by:  Family member Jeremy [name], wife    Pertinent History  Pt is a 42 y/o M who presented on Jul 27, 2017 with R sided weakness, headache, slurred speech.  Cranial CT scan showed L insula and frontal operculum lucency, compatible with infarction.  Underwent attempted mechanical thrombectomy per Interventional Radiology that was unsuccessful, required intubation.  Followup MRI showed cerebellar edema, midline shift.  Underwent L decompressive hemicraniectomy, placement of bone flap in abdominal pocket  Jan 13th, 2019.  Pt went to CIR at [facility] until 2/21 and then received HHPT until end of April. Pt reports occasional dull pain at bone flap site with worst pain reaching 5/10.  Pt normally wears R shoulder subluxation brace, not wearing on eval.  Pt requires assist with RLE for tub and car transfers.  Pt ind with showering, dressing.  Pt reports 1 fall at the end of March when he slid out of bed but no injury. Pt with botox scheduled for 11/19/17, pt reports tone causes R ankle to "curl in".   Pt has knee hyperextension brace but HHPT discharged this.  Pt has R wrist splint to wear at night.  Had a boot on R ankle when sleeping but HHPT said it was too big so HHPT d/c this.  Goals: improve independence with  ambulation, to be able to assist with cooking, improve indepence with stairs (requires assist with and without rail). Pt with h/o DVT in LLE and small PE in March 2019.    Limitations  Walking;House hold activities unable to work    How long can you walk comfortably?  100 ft    Patient Stated Goals  see above    Currently in Pain?  No/denies        TREATMENT   Vitals taken at start of session in sitting: 134/95, pulse 91, SpO2 98%   Bed mobility: Practiced supine <>sit with this PT serving role of bed rail. Pt uses leg hook technique to bring RLE into and OOB. Noted that after sitting on bed the pt immediately transitions to supine without scooting back in bed, putting him at risk of sliding OOB. Instructed pt in scooting back in bed prior to moving to supine. When moving from supine>sit the pt was doing so quickly while lying at edge of bed. Instructed pt to scoot to his L at least 1 foot before performing supine>sit to decrease likelihood of sliding OOB. Improved safety following. x3    Sit<>Stand: pt requires cues for proper hand placement to reach back when preparing to sit. x5  Stand pivot transfer: the pt requires cues to always strap in his R hand on the RW when taking any steps to avoid RUE slipping and pt falling. Pt says he sometimes does not strap R hand on RW. x4    Sit<>stand from chair without armrests (to simluate toilet): cues to scoot to edge of toilet and to push from toilet seat to stand. Cues to reach back for toilet and to perform descent slowly. x4                         PT Education - 12/13/17 1542    Education provided  Yes    Education Details  bed mobility, exercise technique, information provided on the [facility] for probono PT services, very heavy emphasis on importance of safety with all aspects of mobility    Person(s) Educated  Patient    Methods  Explanation;Demonstration;Verbal cues    Comprehension  Verbalized  understanding;Returned demonstration;Verbal cues required;Need further instruction       PT Short Term Goals - 11/16/17 0916      PT SHORT TERM GOAL #1   Title  Pt will independently complete HEP at least 4 days/wk for carryover between sessions`    Time  2    Period  Weeks    Status  New        PT Long  Term Goals - 11/16/17 0931      PT LONG TERM GOAL #1   Title  Pt's 5xSTS will improve to equal or less than 13 seconds to demonstrate improved balance and strength    Baseline  18.70 seconds    Time  5    Period  Weeks    Status  New      PT LONG TERM GOAL #2   Title  Pt's will improve to at least 0.8 m/s to resemble a community ambulator    Baseline  0.08 m/s    Time  6    Period  Weeks    Status  New      PT LONG TERM GOAL #3   Title  Pt will be able to demonstrate safe technique with stand pivot transfer without cues or assist 5/5x for improved safety and independence    Baseline  see evaluation note, unsafe without cues    Time  4    Period  Weeks    Status  New      PT LONG TERM GOAL #4   Title  Pt will be able to stand and assist with cooking a full meal for improved QOL    Baseline  Unable    Time  5    Period  Weeks    Status  New            Plan - 12/13/17 1628    Clinical Impression Statement  Wife expressed safety concern with pt transitioning supine<>sit.  Wife recently reconfigured the layout of their bedroom so the patient would not have to ambulate as far to the bathroom at night without his AFO.  However, this requires the patient to get OOB on the R side.  Practiced this technique during session today with cues provided to improve safety.  Told pt and wife that ultimately safety is the priority so if the pt continues to be unable to perform supine<>sit to the R OOB safely then he will either need to change his orientation in bed or the bed will need to be repositioned again.  There is the option of the [facility] if this continues to be unsafe and  the room does need to be reconfigured.  Wife and pt verbalized understanding.  Wife expressed concerns about pt's ability to stand from standard height toilet when not at home. Simulated sit<>stand from standard height toilet (simulated with chair) with cues for proper hand placement and safety.   Will plan to provide pt with extensive HEP next session to be completed at home as it will be the patient's last visit covered by insurance.  Pt will benefit from continued skilled PT interventions for improved safety with all aspects of mobility.      Rehab Potential  Good    PT Frequency  2x / week    PT Duration  6 weeks    PT Treatment/Interventions  ADLs/Self Care Home Management;Aquatic Therapy;Biofeedback;Cryotherapy;Electrical Stimulation;Iontophoresis /ml Dexamethasone;Moist Heat;Traction;Ultrasound;DME Instruction;Gait training;Stair training;Functional mobility training;Therapeutic activities;Therapeutic exercise;Balance training;Neuromuscular re-education;Cognitive remediation;Patient/family education;Orthotic Fit/Training;Wheelchair mobility training;Manual techniques;Passive range of motion;Dry needling;Energy conservation;Taping    PT Next Visit Plan  provide pt with HEP as it will be the patient's last visit covered by insurance    PT Home Exercise Plan  To be provided with extensive HEP at pt's last session next session; instructed in practicing safe technique for sit<>stand and stand pivot transfers    Recommended Other Services  provided wife and pt with [facility]  information to be contacted and added to their waitlist for probono physical therapy services    Consulted and Agree with Plan of Care  Patient;Family member/caregiver    Family Member Consulted  wife       Patient will benefit from skilled therapeutic intervention in order to improve the following deficits and impairments:  Abnormal gait, Decreased activity tolerance, Decreased balance, Decreased cognition, Decreased  coordination, Decreased endurance, Decreased knowledge of precautions, Decreased knowledge of use of DME, Decreased mobility, Decreased range of motion, Decreased safety awareness, Decreased strength, Difficulty walking, Hypomobility, Hypermobility, Increased muscle spasms, Impaired perceived functional ability, Impaired flexibility, Impaired sensation, Impaired tone, Impaired UE functional use, Impaired vision/preception, Improper body mechanics, Postural dysfunction, Pain  Visit Diagnosis: Muscle weakness (generalized)  Hemiplegia and hemiparesis following cerebral infarction affecting right non-dominant side (HCC)  Other abnormalities of gait and mobility  Other lack of coordination     Problem List Patient Active Problem List   Diagnosis Date Noted  . PE (pulmonary thromboembolism) (HCC) 10/06/2017  . DVT (deep vein thrombosis) in pregnancy (HCC) 10/06/2017  . Acute blood loss anemia   . Vascular headache   . Spastic hemiplegia of right dominant side as late effect of cerebral infarction (HCC)   . Benign essential HTN   . Frontal lobe and executive function deficit following cerebral infarction   . Adjustment disorder with mixed anxiety and depressed mood   . 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 Aphasia due to acute stroke (HCC) 08/07/2017  . B12 deficiency   . Acute respiratory failure (HCC)   . Central line infiltration (HCC)   . Endotracheal tube present   . Fever   . Leukocytosis   . S/P craniotomy 07/30/2017  . Cerebral edema (HCC) 07/28/2017  . Hyperlipidemia 07/28/2017  . Stroke (cerebrum) (HCC) 07/27/2017    Encarnacion Chu PT, DPT 12/13/2017, 4:34 PM  [facility] MAIN [facility] SERVICES [address] [place], [place], [address] Phone: [phone]   Fax:  [phone]  Name: Jeremy [name] MRN: [id number] Date of Birth: [birth year]

## 2017-12-17 ENCOUNTER — Ambulatory Visit: Payer: BLUE CROSS/BLUE SHIELD | Attending: Family Medicine | Admitting: Physical Therapy

## 2017-12-17 ENCOUNTER — Encounter: Payer: Self-pay | Admitting: Physical Therapy

## 2017-12-17 DIAGNOSIS — R278 Other lack of coordination: Secondary | ICD-10-CM | POA: Diagnosis present

## 2017-12-17 DIAGNOSIS — I69353 Hemiplegia and hemiparesis following cerebral infarction affecting right non-dominant side: Secondary | ICD-10-CM | POA: Insufficient documentation

## 2017-12-17 DIAGNOSIS — R2689 Other abnormalities of gait and mobility: Secondary | ICD-10-CM | POA: Diagnosis present

## 2017-12-17 DIAGNOSIS — M6281 Muscle weakness (generalized): Secondary | ICD-10-CM | POA: Diagnosis not present

## 2017-12-18 ENCOUNTER — Telehealth: Payer: Self-pay | Admitting: *Deleted

## 2017-12-18 DIAGNOSIS — I69351 Hemiplegia and hemiparesis following cerebral infarction affecting right dominant side: Secondary | ICD-10-CM

## 2017-12-18 NOTE — Telephone Encounter (Signed)
[facility] is [facility].  I am not sure how to do that but I would be happy to sign

## 2017-12-18 NOTE — Therapy (Signed)
[place] MAIN [facility] SERVICES [address] [place], [place], [address] Phone: [phone]   Fax:  [phone]  Physical Therapy Treatment and Discharge  Patient Details  Name: Jeremy [name] MRN: [id number] Date of Birth: Jun 29, 1976 Referring Provider: Charlett Blake, MD   Encounter Date: 12/17/2017  PT End of Session - 12/17/17 1443    Visit Number  7    Number of Visits  13    Date for PT Re-Evaluation  12/27/17    Authorization Type  7/10 progress note    PT Start Time  4136    PT Stop Time  1518    PT Time Calculation (min)  35 min    Equipment Utilized During Treatment  Gait belt    Activity Tolerance  Patient tolerated treatment well    Behavior During Therapy  [facility] for tasks assessed/performed       Past Medical History:  Diagnosis Date  . Anxiety   . Asthma    MILD AS A CHILD  . Depression   . Deviated septum   . Dyspnea    NORMAL SPIROMETRY 02/01/17 VISIT WITH DR Makanda  . Hypertension   . Sleep apnea   . Stroke [facility])     Past Surgical History:  Procedure Laterality Date  . CRANIOTOMY Left 07/29/2017   Procedure: LEFT HEMI- CRANIECTOMY WITH PLACEMENT OF BONE FLAP IN ABDOMEN;  Surgeon: Consuella Lose, MD;  Location: [place];  Service: Neurosurgery;  Laterality: Left;  . IR ANGIO INTRA EXTRACRAN SEL COM CAROTID INNOMINATE UNI R MOD SED  07/28/2017  . IR ANGIO VERTEBRAL SEL VERTEBRAL UNI R MOD SED  07/28/2017  . IR CT HEAD LTD  07/28/2017  . IR PERCUTANEOUS ART THROMBECTOMY/INFUSION INTRACRANIAL INC DIAG ANGIO  07/28/2017  . NASAL SEPTOPLASTY W/ TURBINOPLASTY Bilateral 03/06/2017   Procedure: NASAL SEPTOPLASTY WITH TURBINATE REDUCTION;  Surgeon: Beverly Gust, MD;  Location: [facility];  Service: ENT;  Laterality: Bilateral;  . RADIOLOGY WITH ANESTHESIA N/A 07/27/2017   Procedure: RADIOLOGY WITH ANESTHESIA;  Surgeon: Luanne Bras, MD;  Location: [place];  Service: Radiology;  Laterality: N/A;    There were no vitals  filed for this visit.  Subjective Assessment - 12/17/17 1449    Subjective  Pt presented with R knee brace which he does not wear anymore since his HHPT told him his knee does not extend as much anymore.  Wife ended up moving the bed back to it's original position so the patient can get OOB on his L side which as been more safe.  Pt is aware of d/c this date due to insurance constraints. No falls since last session.     Patient is accompained by:  Family member [name], wife    Pertinent History  Pt is a 42 y/o M who presented on Jul 27, 2017 with R sided weakness, headache, slurred speech.  Cranial CT scan showed L insula and frontal operculum lucency, compatible with infarction.  Underwent attempted mechanical thrombectomy per Interventional Radiology that was unsuccessful, required intubation.  Followup MRI showed cerebellar edema, midline shift.  Underwent L decompressive hemicraniectomy, placement of bone flap in abdominal pocket Jan 13th, 2019.  Pt went to CIR at [facility] until 2/21 and then received HHPT until end of April. Pt reports occasional dull pain at bone flap site with worst pain reaching 5/10.  Pt normally wears R shoulder subluxation brace, not wearing on eval.  Pt requires assist with RLE for tub and car transfers.  Pt ind with showering, dressing.  Pt reports 1 fall at the end of March when he slid out of bed but no injury. Pt with botox scheduled for 11/19/17, pt reports tone causes R ankle to "curl in".   Pt has knee hyperextension brace but HHPT discharged this.  Pt has R wrist splint to wear at night.  Had a boot on R ankle when sleeping but HHPT said it was too big so HHPT d/c this.  Goals: improve independence with ambulation, to be able to assist with cooking, improve indepence with stairs (requires assist with and without rail). Pt with h/o DVT in LLE and small PE in March 2019.    Limitations  Walking;House hold activities unable to work    How long can you walk comfortably?  100  ft    Patient Stated Goals  see above    Currently in Pain?  No/denies        TREATMENT   Vitals taken at start of session in sitting: 138/100, pulse 89, SpO2 100%   Ambulating in gym with quad cane and practicing turning x100 ft (added to HEP)   Marching with quad cane x15 each LE (added to HEP)   Weight shifting with airex (book at home) under LLE leg with this PT initially facilitating weight shift to RLE via tactile cues (added to HEP)   Stepping forward with RLE and weight shifting to RLE (PT tactile facilitation initially) (added to HEP)  Stepping back with RLE and weight shifting to RLE (PT tactile facilitation initially) (added to HEP)  5xSTS: 19.52 seconds           PT Education - 12/17/17 1442    Education provided  Yes    Education Details  HEP and handout; discharge this date; reminder to follow up with [facility] for additional PT services    Person(s) Educated  Patient;Spouse    Methods  Explanation;Demonstration;Handout;Verbal cues    Comprehension  Verbalized understanding;Returned demonstration;Verbal cues required       PT Short Term Goals - 12/17/17 1511      PT SHORT TERM GOAL #1   Title  Pt will independently complete HEP at least 4 days/wk for carryover between sessions`    Baseline  Pt has been completing his HEP 2-3 days/wk    Time  2    Period  Weeks    Status  Partially Met        PT Long Term Goals - 12/17/17 1512      PT LONG TERM GOAL #1   Title  Pt's 5xSTS will improve to equal or less than 13 seconds to demonstrate improved balance and strength    Baseline  18.70 seconds; 6/3: 19.52 seconds    Time  5    Period  Weeks    Status  On-going      PT LONG TERM GOAL #2   Title  Pt's 43mT will improve to at least 0.8 m/s to resemble a community ambulator    Baseline  0.08 m/s    Time  6    Period  Weeks    Status  New      PT LONG TERM GOAL #3   Title  Pt will be able to demonstrate safe technique with stand pivot transfer  without cues or assist 5/5x for improved safety and independence    Baseline  see evaluation note, unsafe without cues    Time  4    Period  Weeks  Status  New      PT LONG TERM GOAL #4   Title  Pt will be able to stand and assist with cooking a full meal for improved QOL    Baseline  Unable; 6/3: pt has been helping to make sandwiches but is unable    Time  5    Period  Weeks    Status  Partially Met            Plan - 12/18/17 1030    Clinical Impression Statement  Pt presents late, limiting last session.  Pt was instructed in and completed HEP exercises and handout provided.  Unable to re-assess all goals as limited time was available and time was focused on providing patient with HEP for long term success.  Focus was on facilitating involuntary activation of RLE musculature as pt has had difficulty with voluntary activation.  Wife present during session and this PT provided instruction on how to safely assist the pt with HEP, wife was appreciative and verbalized understanding (was unable to physically participate as she was caring for her newborn).  Pt's 5xSTS time has not improved since evaluation as focus has been more on the safety aspects of this transfer rather than the speed. Pt does report that he is now able to assist with meal prep when making sandwiches but remains limited with standing endurance and ability to assist with full meal prep.  R knee extension brace was assessed today and the recommendation was made to return to the orthotic center who did the original fitting to have the brace adjusted (straps too tight around thigh).  This will allow the pt to utilize this brace when he is ambulating farther distances (increased R knee hyperextension with fatigue).  Pt was understanding of discharge this date due to insurance restraints.  Information on a local probono physical therapy clinic was provided to the patient last session and the wife has been communicating with this  clinic.      Rehab Potential  Good    PT Frequency  -- d/c this date    PT Duration  -- d/c this date    PT Treatment/Interventions  ADLs/Self Care Home Management;Aquatic Therapy;Biofeedback;Cryotherapy;Electrical Stimulation;Iontophoresis 69m/ml Dexamethasone;Moist Heat;Traction;Ultrasound;DME Instruction;Gait training;Stair training;Functional mobility training;Therapeutic activities;Therapeutic exercise;Balance training;Neuromuscular re-education;Cognitive remediation;Patient/family education;Orthotic Fit/Training;Wheelchair mobility training;Manual techniques;Passive range of motion;Dry needling;Energy conservation;Taping    PT Next Visit Plan  d/c this date    PT Home Exercise Plan  bridges, SLR, walking with quad cane, marching with LUE support, weight shifting with book under LLE, stepping forward on RLE with weight shift, stepping back on RLE with weight shift    Recommended Other Services  follow up with [facility](local [facility] physical therapy clinic)    Consulted and Agree with Plan of Care  Patient;Family member/caregiver    Family Member Consulted  wife       Patient will benefit from skilled therapeutic intervention in order to improve the following deficits and impairments:  Abnormal gait, Decreased activity tolerance, Decreased balance, Decreased cognition, Decreased coordination, Decreased endurance, Decreased knowledge of precautions, Decreased knowledge of use of DME, Decreased mobility, Decreased range of motion, Decreased safety awareness, Decreased strength, Difficulty walking, Hypomobility, Hypermobility, Increased muscle spasms, Impaired perceived functional ability, Impaired flexibility, Impaired sensation, Impaired tone, Impaired UE functional use, Impaired vision/preception, Improper body mechanics, Postural dysfunction, Pain  Visit Diagnosis: Muscle weakness (generalized)  Hemiplegia and hemiparesis following cerebral infarction affecting right non-dominant side  (HCC)  Other abnormalities of gait and mobility  Other lack of coordination     Problem List Patient Active Problem List   Diagnosis Date Noted  . PE (pulmonary thromboembolism) ([place]) 10/06/2017  . DVT (deep vein thrombosis) in pregnancy ([place]) 10/06/2017  . Acute blood loss anemia   . Vascular headache   . Spastic hemiplegia of right dominant side as late effect of cerebral infarction ([place])   . Benign essential HTN   . Frontal lobe and executive function deficit following cerebral infarction   . Adjustment disorder with mixed anxiety and depressed mood   . Left middle cerebral artery stroke ([place]) 08/07/2017  . Aphasia due to acute stroke ([place]) 08/07/2017  . B12 deficiency   . Acute respiratory failure ([place])   . Central line infiltration ([place])   . Endotracheal tube present   . Fever   . Leukocytosis   . S/P craniotomy 07/30/2017  . Cerebral edema (HCC) 07/28/2017  . Hyperlipidemia 07/28/2017  . Stroke (cerebrum) ([place]) 07/27/2017    Collie Siad PT, DPT 12/18/2017, 10:34 AM  [place] MAIN [facility] SERVICES [address] [place], [place], [address] Phone: [phone]   Fax:  [phone]  Name: Jeremy [name] MRN: [id number] Date of Birth: 1975/12/08

## 2017-12-18 NOTE — Telephone Encounter (Signed)
Patient's wife left a message that she is concerned  That the patient only has 1 more visit with outpatient [facility].  She is hoping that we could do something like file for an extension. I contacted [facility] and they say the patient has reached the hard limit with no exception.  I contacted the insurance company and was basically told the same.  [facility] therapy has already started the process of getting the patient enrolled in the [facility].  Patient's wife is aware and agrees.  They are asking us to submit a referral to [facility] if Dr. Wynn BankerKirsteins agrees. If this is the case we will need to print referral, sign and fax to [phone]

## 2017-12-20 ENCOUNTER — Encounter: Payer: BLUE CROSS/BLUE SHIELD | Admitting: Physical Therapy

## 2017-12-20 ENCOUNTER — Ambulatory Visit: Payer: BLUE CROSS/BLUE SHIELD | Admitting: Occupational Therapy

## 2017-12-20 NOTE — Telephone Encounter (Signed)
Order placed and to be faxed by Stevie KernL Miller. To [facility].

## 2017-12-25 ENCOUNTER — Other Ambulatory Visit: Payer: Self-pay

## 2017-12-25 ENCOUNTER — Encounter: Payer: Self-pay | Admitting: Occupational Therapy

## 2017-12-25 ENCOUNTER — Ambulatory Visit: Payer: BLUE CROSS/BLUE SHIELD | Admitting: Occupational Therapy

## 2017-12-25 ENCOUNTER — Ambulatory Visit: Payer: BLUE CROSS/BLUE SHIELD | Admitting: Physical Therapy

## 2017-12-25 DIAGNOSIS — M6281 Muscle weakness (generalized): Secondary | ICD-10-CM | POA: Diagnosis not present

## 2017-12-25 DIAGNOSIS — I69353 Hemiplegia and hemiparesis following cerebral infarction affecting right non-dominant side: Secondary | ICD-10-CM

## 2017-12-25 DIAGNOSIS — R278 Other lack of coordination: Secondary | ICD-10-CM

## 2017-12-25 NOTE — Therapy (Signed)
[place] MAIN [facility] SERVICES [address] [place], [place], [address] Phone: [phone]   Fax:  [phone]  Occupational Therapy Treatment/DIscharge Summary  Patient Details  Name: Jeremy [name] MRN: [id number] Date of Birth: 09/06/1975 Referring Provider: Charlett Blake, MD   Encounter Date: 12/25/2017  OT End of Session - 12/25/17 1214    Visit Number  6    Number of Visits  24    OT Start Time  0950    OT Stop Time  1030    OT Time Calculation (min)  40 min    Activity Tolerance  Patient tolerated treatment well    Behavior During Therapy  [facility] for tasks assessed/performed       Past Medical History:  Diagnosis Date  . Anxiety   . Asthma    MILD AS A CHILD  . Depression   . Deviated septum   . Dyspnea    NORMAL SPIROMETRY 02/01/17 VISIT WITH DR [place]  . Hypertension   . Sleep apnea   . Stroke [facility])     Past Surgical History:  Procedure Laterality Date  . CRANIOTOMY Left 07/29/2017   Procedure: LEFT HEMI- CRANIECTOMY WITH PLACEMENT OF BONE FLAP IN ABDOMEN;  Surgeon: Consuella Lose, MD;  Location: [place];  Service: Neurosurgery;  Laterality: Left;  . IR ANGIO INTRA EXTRACRAN SEL COM CAROTID INNOMINATE UNI R MOD SED  07/28/2017  . IR ANGIO VERTEBRAL SEL VERTEBRAL UNI R MOD SED  07/28/2017  . IR CT HEAD LTD  07/28/2017  . IR PERCUTANEOUS ART THROMBECTOMY/INFUSION INTRACRANIAL INC DIAG ANGIO  07/28/2017  . NASAL SEPTOPLASTY W/ TURBINOPLASTY Bilateral 03/06/2017   Procedure: NASAL SEPTOPLASTY WITH TURBINATE REDUCTION;  Surgeon: Beverly Gust, MD;  Location: [facility];  Service: ENT;  Laterality: Bilateral;  . RADIOLOGY WITH ANESTHESIA N/A 07/27/2017   Procedure: RADIOLOGY WITH ANESTHESIA;  Surgeon: Luanne Bras, MD;  Location: [name];  Service: Radiology;  Laterality: N/A;    There were no vitals filed for this visit.  Subjective Assessment - 12/25/17 1207    Subjective   Pt reports that his sister in law is having a  boy and his wife was hoping it would be a girl so their child was close in age and same gender.    Patient is accompained by:  Family member    Pertinent History  42 year old right-handed male with history of hypertension, on no antihypertensive medications, who presented and July 27, 2017, with right-sided weakness, headache, and slurred speech.  He lives with spouse and 14-year-old son, independent prior to admission.  Cranial CT scan showed left insula and frontal operculum lucency, compatible with infarction.  Echocardiogram with ejection fraction of 65%, no wall motion abnormalities, no PFO.  CT angiography of the head and neck showed a left M1 occlusion, intermediate left MCA distribution collateralization.  Underwent attempted mechanical thrombectomy per Interventional Radiology that was unsuccessful, required intubation.Followup MRI showed cerebellar edema, midline shift; placed on 3% saline.  Underwent left decompressive hemicraniectomy, placement of bone flap in abdominal pocket on July 29, 2017, per Dr. Kathyrn Sheriff.    Patient Stated Goals  Patient would like to regain ability to take care of self.    Currently in Pain?  No/denies    Pain Score  0-No pain    Multiple Pain Sites  No          The above patient had been seen in Occupational Therapy 6 times of 6 treatments scheduled with 0 no shows and  1 cancellations.  The treatment consisted of neuromuscular re-education of RUE and hand, ADL and IADL re-training using compensatory strategies, one-handed techniques, balance training and HEP.  The patient is: improved.    Discharge Findings: Pt met 2/5 goals and partially met 1/5 goals and did not meet 2/5 goals.  Pt continues to have no active movement in RUE or hand and wears a shoulder brace due to subluxation.  Pt has done well with follow through of strategies to protect his RUE and hand.  He has mild edema in R hand.  His wife is very supportive and he has been helping with taking  care of their 2 young children but needs encouragement.  Pt would benefit from further OT and rec [facility] clinic in [place] since insurance has run out.   Functional Status at Discharge: Pt is able to complete grooming and dressing skills independently except for closing buttons and donning RLE AFO which his wife assists with.  He is able to complete toileting and hygiene independently.  With set up, pt is able to assist with feeding his baby girl a bottle and assist with diaper changes.  He continues to wear his shoulder brace for subluxation and is able to don the brace over his R hand but needs assistance from his wife for securing in place.              OT Treatments/Exercises (OP) - 12/25/17 1208      ADLs   ADL Comments  Pt seen for re-assessment of goals and plan for last visit of OT today due  to insurance restrictions.  Pt needs more threrapy to regain more independence in ADLs and use of RUE and hand and referred to [facility] clinic in [place] which his wife already initiated referral and and needs paperwork from our facility and referral from his doctor.  Pt has met 2 goals, partially met one goal and did not meet 2 goals.  Please see goals section for updates.        Neurological Re-education Exercises   Other Exercises 1  Pt and his wife were educated in several different exercises to do at home which have been completed during therapy in supine, L sidelying and sitting in chair or EOB for neuromusclular re-ed with and without assist or use of ball.  His wife wrote down notes for HEP so no written instructions were needed.  Rec continued edema massage and sensory input to RUE and hand.  Also rec he start to use L hand as gross assist to help with actiivities like washing dishes, helping with diapers for his daughter, folding laundry while seated.               OT Education - 12/25/17 1213    Education provided  Yes    Education Details  HEP; discharge this date' rec follow up at [facility]  clinic in [facility]) Educated  Patient    Methods  Explanation;Demonstration;Verbal cues    Comprehension  Verbalized understanding;Returned demonstration          OT Long Term Goals - 12/25/17 1217      OT LONG TERM GOAL #1   Title  Patient will demonstrate improved balance to perform clothing negotiation with dressing/toileting with supervision only.     Baseline  min to mod assist to complete at eval    Time  8    Period  Weeks    Status  Achieved  OT LONG TERM GOAL #2   Title  Patient will demonstrate ability to manage shoulder brace with modified independence.    Baseline  unable at eval    Time  4    Period  Weeks    Status  Partially Met      OT LONG TERM GOAL #3   Title  Patient will demonstrate trace movement of elbow extension with facilitation to work towards neuromuscular reeducation for active arm use on the right .    Baseline  no active movement at eval    Time  12    Period  Weeks    Status  Not Met      OT LONG TERM GOAL #4   Title  Patient will demonstate understanding of compensatory strategies to protect right UE from harm during engagement  in daily activities.     Baseline  sensation absent in right UE and at risk for injury.    Time  12    Period  Weeks    Status  Achieved      OT LONG TERM GOAL #5   Title  Patient will complete cutting meat one handed with modified independence with adaptive equipment as needed.     Baseline  unable at eval    Time  8    Period  Weeks    Status  Not Met            Plan - 12/25/17 1214    Clinical Impression Statement  Pt seen for last OT session and Discharge visit due to insurance no longer authorizing anymore sessions.  He continues to need more OT services and goals were reviewd and updated with pt and his wife with most time spent on HEP and rec for participation in more IADLs and child care tasks.    Occupational Profile and client history currently impacting functional performance   absent sensation in RUE from shoulder to digits, no active right UE movement, decreased right peripheral vision, has 25 week old infant at home, recent move from 98rd story apartment, unable to work, bone flap with helmet precautions.    Occupational performance deficits (Please refer to evaluation for details):  ADL's;IADL's;Leisure;Work    Physicist, medical    Current Impairments/barriers affecting progress:  positive:  motivation, family support.  Negative:  young age, lack of sensation on the right for light touch, sharp/dull, deep pressure, hot/cold, processing delay cognitively    OT Treatment/Interventions  Self-care/ADL training;Therapeutic exercise;Neuromuscular education;Patient/family education;Therapist, nutritional;Therapeutic activities;Balance training;DME and/or AE instruction;Manual Therapy;Passive range of motion    Clinical Decision Making  Several treatment options, min-mod task modification necessary    Consulted and Agree with Plan of Care  Patient;Family member/caregiver    Family Member Consulted  wife Melanie       Patient will benefit from skilled therapeutic intervention in order to improve the following deficits and impairments:  Abnormal gait, Decreased cognition, Decreased knowledge of use of DME, Increased edema, Pain, Decreased coordination, Decreased mobility, Impaired sensation, Decreased activity tolerance, Decreased endurance, Decreased range of motion, Decreased strength, Impaired tone, Decreased balance, Decreased knowledge of precautions, Decreased safety awareness, Difficulty walking, Impaired UE functional use  Visit Diagnosis: Muscle weakness (generalized)  Hemiplegia and hemiparesis following cerebral infarction affecting right non-dominant side (HCC)  Other lack of coordination    Problem List Patient Active Problem List   Diagnosis Date Noted  . PE (pulmonary thromboembolism) ([place]) 10/06/2017  . DVT (deep vein thrombosis) in pregnancy  (  [place]) 10/06/2017  . Acute blood loss anemia   . Vascular headache   . Spastic hemiplegia of right dominant side as late effect of cerebral infarction ([place])   . Benign essential HTN   . Frontal lobe and executive function deficit following cerebral infarction   . Adjustment disorder with mixed anxiety and depressed mood   . Left middle cerebral artery stroke ([place]) 08/07/2017  . Aphasia due to acute stroke ([place]) 08/07/2017  . B12 deficiency   . Acute respiratory failure ([place])   . Central line infiltration ([place])   . Endotracheal tube present   . Fever   . Leukocytosis   . S/P craniotomy 07/30/2017  . Cerebral edema (HCC) 07/28/2017  . Hyperlipidemia 07/28/2017  . Stroke (cerebrum) [facility]) 07/27/2017   Chrys Racer, OTR/L ascom [phone] 12/25/17, 1:26 PM  [place] MAIN [facility] SERVICES [address] [place], [place], [address] Phone: [phone]   Fax:  [phone]  Name: Jeremy [name] MRN: [id number] Date of Birth: 12-Mar-1976

## 2017-12-25 NOTE — Therapy (Deleted)
[place] MAIN [facility] SERVICES [address] [place], [place], [address] Phone: [phone]   Fax:  [phone]  December 25, 2017      Occupational Therapy Discharge Summary   Patient: Jeremy [name] MRN: [id number] Date of Birth: September 19, 1975  Diagnosis: Muscle weakness (generalized)  Hemiplegia and hemiparesis following cerebral infarction affecting right non-dominant side [facility])  Other lack of coordination  Referring Provider: Charlett Blake, MD   The above patient had been seen in Occupational Therapy 6 times of 6 treatments scheduled with 0 no shows and 1 cancellations.  The treatment consisted of neuromuscular re-education of RUE and hand, ADL and IADL re-training using compensatory strategies, one-handed techniques, balance training and HEP.  The patient is: improved.  Subjective:   Discharge Findings: Pt met 2/5 goals and partially met 1/5 goals and did not meet 2/5 goals.  Pt continues to have no active movement in RUE or hand and wears a shoulder brace due to subluxation.  Pt has done well with follow through of strategies to protect his RUE and hand.  He has mild edema in R hand.  His wife is very supportive and he has been helping with taking care of their 2 young children but needs encouragement.  Pt would benefit from further OT and rec [facility] clinic in [place] since insurance has run out.   Functional Status at Discharge: Pt is able to complete grooming and dressing skills independently except for closing buttons and donning RLE AFO which his wife assists with.  He is able to complete toileting and hygiene independently.  With set up, pt is able to assist with feeding his baby girl a bottle and assist with diaper changes.  He continues to wear his shoulder brace for subluxation and is able to don the brace over his R hand but needs assistance from his wife for securing in place.      Plan - 12/25/17 1214    Clinical Impression Statement   Pt seen for last OT session and Discharge visit due to insurance no longer authorizing anymore sessions.  He continues to need more OT services and goals were reviewd and updated with pt and his wife with most time spent on HEP and rec for participation in more IADLs and child care tasks.    Occupational Profile and client history currently impacting functional performance  absent sensation in RUE from shoulder to digits, no active right UE movement, decreased right peripheral vision, has 18 week old infant at home, recent move from 27rd story apartment, unable to work, bone flap with helmet precautions.    Occupational performance deficits (Please refer to evaluation for details):  ADL's;IADL's;Leisure;Work    Physicist, medical    Current Impairments/barriers affecting progress:  positive:  motivation, family support.  Negative:  young age, lack of sensation on the right for light touch, sharp/dull, deep pressure, hot/cold, processing delay cognitively    OT Treatment/Interventions  Self-care/ADL training;Therapeutic exercise;Neuromuscular education;Patient/family education;Therapist, nutritional;Therapeutic activities;Balance training;DME and/or AE instruction;Manual Therapy;Passive range of motion    Clinical Decision Making  Several treatment options, min-mod task modification necessary    Consulted and Agree with Plan of Care  Patient;Family member/caregiver    Family Member Consulted  wife [name]       Sincerely,  Chrys Racer, OTR/L ascom [phone] 12/25/17, 1:21 PM    [place] MAIN [facility] SERVICES [address] [place], [place], [address] Phone: [phone]   Fax:  [phone]  Patient: Jeremy [name] MRN: [id number] Date of Birth: 12-22-1975

## 2017-12-27 ENCOUNTER — Ambulatory Visit: Payer: BLUE CROSS/BLUE SHIELD | Admitting: Occupational Therapy

## 2017-12-27 ENCOUNTER — Ambulatory Visit: Payer: BLUE CROSS/BLUE SHIELD | Admitting: Physical Therapy

## 2017-12-31 ENCOUNTER — Encounter: Payer: Self-pay | Admitting: Physical Medicine & Rehabilitation

## 2017-12-31 ENCOUNTER — Telehealth: Payer: Self-pay | Admitting: Neurology

## 2017-12-31 ENCOUNTER — Ambulatory Visit: Payer: BLUE CROSS/BLUE SHIELD | Admitting: Physical Medicine & Rehabilitation

## 2017-12-31 ENCOUNTER — Encounter: Payer: BLUE CROSS/BLUE SHIELD | Attending: Registered Nurse

## 2017-12-31 VITALS — BP 131/91 | HR 87 | Resp 14 | Ht 71.0 in | Wt 195.0 lb

## 2017-12-31 DIAGNOSIS — I63512 Cerebral infarction due to unspecified occlusion or stenosis of left middle cerebral artery: Secondary | ICD-10-CM | POA: Insufficient documentation

## 2017-12-31 DIAGNOSIS — J45909 Unspecified asthma, uncomplicated: Secondary | ICD-10-CM | POA: Diagnosis not present

## 2017-12-31 DIAGNOSIS — F419 Anxiety disorder, unspecified: Secondary | ICD-10-CM | POA: Diagnosis not present

## 2017-12-31 DIAGNOSIS — I69351 Hemiplegia and hemiparesis following cerebral infarction affecting right dominant side: Secondary | ICD-10-CM | POA: Diagnosis not present

## 2017-12-31 DIAGNOSIS — F329 Major depressive disorder, single episode, unspecified: Secondary | ICD-10-CM | POA: Diagnosis not present

## 2017-12-31 DIAGNOSIS — I10 Essential (primary) hypertension: Secondary | ICD-10-CM | POA: Diagnosis not present

## 2017-12-31 DIAGNOSIS — Z9889 Other specified postprocedural states: Secondary | ICD-10-CM | POA: Insufficient documentation

## 2017-12-31 DIAGNOSIS — G4730 Sleep apnea, unspecified: Secondary | ICD-10-CM | POA: Insufficient documentation

## 2017-12-31 DIAGNOSIS — G441 Vascular headache, not elsewhere classified: Secondary | ICD-10-CM | POA: Insufficient documentation

## 2017-12-31 NOTE — Patient Instructions (Addendum)
The stroke prophyllaxis recommended by Neurology post hospitalization was Aspirin  Usual duration of treatment for DVT is 3-8420mo , if the DVT is in the non paralyzed leg  Please get Neurology opinion in regards to timing of surgery for flap replacement

## 2017-12-31 NOTE — Progress Notes (Signed)
Subjective:    Patient ID: Jeremy [name], male    DOB: 1975/09/23, 42 y.o.   MRN: [id number] 42 year old male with history of left MCA infarct onset in January 2019 with resultant severe right hemiparesis and spasticity HPI Patient underwent botulinum toxin injection for right lower extremity spasticity on 11/19/2017.  Had no postprocedure complications.  He feels like his clonus has improved to a certain degree but still has some.  His wife is here with him today she notes some excess tone in the thumb and finger flexors especially towards the end of the day.  He does wear a resting hand splint at night.  Also discussed his visit with neurosurgery.  Neurosurgery is ready to replace the bone flap in his skull.  Unfortunately the patient developed DVT in the left lower extremity which is his uninvolved side on 10/08/2017 as well as a small PE and was placed on Eliquis. Prior to his DVT he was placed on aspirin by neurology for post stroke secondary prophylaxis.  He is still on low-dose aspirin i.e. 81 mg in conjunction with his Eliquis.  He has a follow-up with neurology to advise upon timing of his bone flap replacement. Pain Inventory Average Pain 0 Pain Right Now 0 My pain is intermittent and dull  In the last 24 hours, has pain interfered with the following? General activity 0 Relation with others 0 Enjoyment of life 0 What TIME of day is your pain at its worst? night Sleep (in general) Fair  Pain is worse with: walking, bending and some activites Pain improves with: rest and heat/ice Relief from Meds: 2  Mobility walk with assistance use a cane use a walker ability to climb steps?  yes do you drive?  no needs help with transfers transfers alone Do you have any goals in this area?  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  yes  Neuro/Psych trouble walking  Prior Studies Any changes since last visit?  no  Physicians involved in your care Any changes since last visit?  no   Family History  Problem Relation Age of Onset  . Hypertension Father   . Hypertension Brother    Social History   Socioeconomic History  . Marital status: Married    Spouse name: Not on file  . Number of children: Not on file  . Years of education: Not on file  . Highest education level: Not on file  Occupational History  . Not on file  Social Needs  . Financial resource strain: Not on file  . Food insecurity:    Worry: Not on file    Inability: Not on file  . Transportation needs:    Medical: Not on file    Non-medical: Not on file  Tobacco Use  . Smoking status: Never Smoker  . Smokeless tobacco: Never Used  Substance and Sexual Activity  . Alcohol use: Yes    Comment: RARE  . Drug use: No  . Sexual activity: Not on file  Lifestyle  . Physical activity:    Days per week: Not on file    Minutes per session: Not on file  . Stress: Not on file  Relationships  . Social connections:    Talks on phone: Not on file    Gets together: Not on  file    Attends religious service: Not on file    Active member of club or organization: Not on file    Attends meetings of clubs or organizations: Not on file    Relationship status: Not on file  Other Topics Concern  . Not on file  Social History Narrative  . Not on file   Past Surgical History:  Procedure Laterality Date  . CRANIOTOMY Left 07/29/2017   Procedure: LEFT HEMI- CRANIECTOMY WITH PLACEMENT OF BONE FLAP IN ABDOMEN;  Surgeon: Lisbeth RenshawNundkumar, Neelesh, MD;  Location: [facility] OR;  Service: Neurosurgery;  Laterality: Left;  . IR ANGIO INTRA EXTRACRAN SEL COM CAROTID INNOMINATE UNI R MOD SED  07/28/2017  . IR ANGIO VERTEBRAL SEL VERTEBRAL UNI R MOD SED  07/28/2017  . IR CT HEAD LTD  07/28/2017  . IR PERCUTANEOUS ART  THROMBECTOMY/INFUSION INTRACRANIAL INC DIAG ANGIO  07/28/2017  . NASAL SEPTOPLASTY W/ TURBINOPLASTY Bilateral 03/06/2017   Procedure: NASAL SEPTOPLASTY WITH TURBINATE REDUCTION;  Surgeon: Linus SalmonsMcQueen, Chapman, MD;  Location: [facility] ORS;  Service: ENT;  Laterality: Bilateral;  . RADIOLOGY WITH ANESTHESIA N/A 07/27/2017   Procedure: RADIOLOGY WITH ANESTHESIA;  Surgeon: Julieanne Cottoneveshwar, Sanjeev, MD;  Location: [facility] OR;  Service: Radiology;  Laterality: N/A;   Past Medical History:  Diagnosis Date  . Anxiety   . Asthma    MILD AS A CHILD  . Depression   . Deviated septum   . Dyspnea    NORMAL SPIROMETRY 02/01/17 VISIT WITH DR HEDRICK  . Hypertension   . Sleep apnea   . Stroke (HCC)    BP (!) 131/91 (BP Location: Left Arm, Patient Position: Sitting, Cuff Size: Normal)   Pulse 87   Resp 14   Ht 5\' 11"  (1.803 m)   Wt 195 lb (88.5 kg)   SpO2 97%   BMI 27.20 kg/m   Opioid Risk Score:   Fall Risk Score:  `1  Depression screen PHQ 2/9  Depression screen PHQ 2/9 09/24/2017  Decreased Interest 2  Down, Depressed, Hopeless 1  PHQ - 2 Score 3  Altered sleeping 0  Tired, decreased energy 2  Change in appetite 2  Feeling bad or failure about yourself  1  Trouble concentrating 0  Moving slowly or fidgety/restless 0  Suicidal thoughts 0  PHQ-9 Score 8  Difficult doing work/chores Somewhat difficult    Review of Systems  Constitutional: Negative.   HENT: Negative.   Eyes: Negative.   Respiratory: Negative.   Cardiovascular: Negative.   Gastrointestinal: Negative.   Endocrine: Negative.   Genitourinary: Negative.   Musculoskeletal: Positive for gait problem.  Skin: Negative.   Allergic/Immunologic: Negative.   Hematological: Negative.   Psychiatric/Behavioral: Negative.   All other systems reviewed and are negative.      Objective:   Physical Exam  Constitutional: He appears well-developed and well-nourished. No distress.  HENT:  Head: Normocephalic and atraumatic.  Eyes: Pupils are  equal, round, and reactive to light. EOM are normal.  Musculoskeletal:  There is no evidence of swelling or tenderness in bilateral calves of bilateral thighs.  No evidence of erythema.  There is some mild edema in the dorsum of the right hand and wrist area.   Skin: He is not diaphoretic.  Nursing note and vitals reviewed.   MAS 2 spasticity in the finger flexors, MAS 3 spasticity in the right thumb flexor MAS 3 at the ankle dorsiflexors, MAS 0 in the foot inverters and in the toe flexors. Motor strength is essentially 0/5 in the  right upper limb 4/5 in the right knee extensor 3- in the right hip flexor Ambulates with an AFO as well as a cane there is hyperextension at the knee     Assessment & Plan:   1.  Left MCA infarct with right spastic hemiplegia with advised the following dosing for next Botox injection in approximately 6 weeks  FPL 25 U FDS 25 U  Extra 50U in gastroc Right posterior tibialis 75 units Right medial gastroc 50 units Right lateral gastroc 50 units Right medial soleus 75 units Right lateral soleus 50 units  2.  Follow-up with neurology in terms of timing of replacement of his bone flap.  He will be approximately 6 months post stroke next month and approximately 3 months post DVT/PE at the end of this month.

## 2017-12-31 NOTE — Telephone Encounter (Signed)
Rn spoke to wife Shawna OrleansMelanie about pt being off eliquis for his neurosurgery. PT is having surgery. Pt is on eliquis for a blood clot after the stroke. RN stated the office doing the surgery,and procedure needs to send us a clearance form. RN stated an appt is not needed at this time. RN stated pt was just seen 10/2017. The wife will have neurosurgery office call or or fax us a clearance form.The wife verbalized understanding.

## 2017-12-31 NOTE — Telephone Encounter (Signed)
Pts wife called stating the neurosurgeon is needing clearance for the pt to come off Eliquis safety for the pt to move forward with surgery. PCP will not give the okay without neurologist decision. Please call to advise.

## 2018-01-01 ENCOUNTER — Ambulatory Visit: Payer: BLUE CROSS/BLUE SHIELD | Admitting: Physical Therapy

## 2018-01-03 ENCOUNTER — Ambulatory Visit: Payer: BLUE CROSS/BLUE SHIELD | Admitting: Physical Therapy

## 2018-01-08 ENCOUNTER — Encounter: Payer: BLUE CROSS/BLUE SHIELD | Admitting: Occupational Therapy

## 2018-01-08 ENCOUNTER — Ambulatory Visit: Payer: BLUE CROSS/BLUE SHIELD | Admitting: Physical Therapy

## 2018-01-09 ENCOUNTER — Emergency Department (HOSPITAL_COMMUNITY): Payer: BLUE CROSS/BLUE SHIELD

## 2018-01-09 ENCOUNTER — Encounter (HOSPITAL_COMMUNITY): Payer: Self-pay | Admitting: Emergency Medicine

## 2018-01-09 ENCOUNTER — Other Ambulatory Visit: Payer: Self-pay

## 2018-01-09 ENCOUNTER — Emergency Department (HOSPITAL_COMMUNITY)
Admission: EM | Admit: 2018-01-09 | Discharge: 2018-01-09 | Disposition: A | Discharge status: Home / Self Care | Payer: BLUE CROSS/BLUE SHIELD | Attending: Emergency Medicine | Admitting: Emergency Medicine

## 2018-01-09 DIAGNOSIS — Y9389 Activity, other specified: Secondary | ICD-10-CM | POA: Diagnosis not present

## 2018-01-09 DIAGNOSIS — Z043 Encounter for examination and observation following other accident: Secondary | ICD-10-CM | POA: Insufficient documentation

## 2018-01-09 DIAGNOSIS — W19XXXA Unspecified fall, initial encounter: Secondary | ICD-10-CM | POA: Diagnosis not present

## 2018-01-09 DIAGNOSIS — Y929 Unspecified place or not applicable: Secondary | ICD-10-CM | POA: Insufficient documentation

## 2018-01-09 DIAGNOSIS — M25521 Pain in right elbow: Secondary | ICD-10-CM | POA: Diagnosis not present

## 2018-01-09 DIAGNOSIS — S0990XA Unspecified injury of head, initial encounter: Secondary | ICD-10-CM | POA: Insufficient documentation

## 2018-01-09 DIAGNOSIS — J45909 Unspecified asthma, uncomplicated: Secondary | ICD-10-CM | POA: Insufficient documentation

## 2018-01-09 DIAGNOSIS — Z8673 Personal history of transient ischemic attack (TIA), and cerebral infarction without residual deficits: Secondary | ICD-10-CM | POA: Insufficient documentation

## 2018-01-09 DIAGNOSIS — Y998 Other external cause status: Secondary | ICD-10-CM | POA: Diagnosis not present

## 2018-01-09 LAB — BASIC METABOLIC PANEL
Anion gap: 8 (ref 5–15)
BUN: 16 mg/dL (ref 6–20)
CHLORIDE: 99 mmol/L (ref 98–111)
CO2: 29 mmol/L (ref 22–32)
Calcium: 9.2 mg/dL (ref 8.9–10.3)
Creatinine, Ser: 1.06 mg/dL (ref 0.61–1.24)
GFR calc Af Amer: >60 mL/min (ref >60)
GFR calc non Af Amer: >60 mL/min (ref >60)
GLUCOSE: 97 mg/dL (ref 70–99)
POTASSIUM: 4.6 mmol/L (ref 3.5–5.1)
Sodium: 136 mmol/L (ref 135–145)

## 2018-01-09 LAB — CBC WITH DIFFERENTIAL/PLATELET
Abs Immature Granulocytes: 0 10*3/uL (ref 0.0–0.1)
Basophils Absolute: 0 10*3/uL (ref 0.0–0.1)
Basophils Relative: 0 %
EOS PCT: 1 %
Eosinophils Absolute: 0.1 10*3/uL (ref 0.0–0.7)
HEMATOCRIT: 45.2 % (ref 39.0–52.0)
HEMOGLOBIN: 14.5 g/dL (ref 13.0–17.0)
Immature Granulocytes: 0 %
LYMPHS ABS: 1.7 10*3/uL (ref 0.7–4.0)
LYMPHS PCT: 16 %
MCH: 28.8 pg (ref 26.0–34.0)
MCHC: 32.1 g/dL (ref 30.0–36.0)
MCV: 89.9 fL (ref 78.0–100.0)
MONO ABS: 0.6 10*3/uL (ref 0.1–1.0)
Monocytes Relative: 6 %
Neutro Abs: 8 10*3/uL — ABNORMAL HIGH (ref 1.7–7.7)
Neutrophils Relative %: 77 %
PLATELETS: 280 10*3/uL (ref 150–400)
RBC: 5.03 MIL/uL (ref 4.22–5.81)
RDW: 14.9 % (ref 11.5–15.5)
WBC: 10.4 10*3/uL (ref 4.0–10.5)

## 2018-01-09 LAB — CBG MONITORING, ED: Glucose-Capillary: 92 mg/dL (ref 70–99)

## 2018-01-09 NOTE — ED Triage Notes (Signed)
Per EMS pt was at restaurant reports leaning over to grab food and fell out of chair. Patient has right sided deficits from stroke from January. Pt unsure of LOC or hitting head. Reports episode of emesis after fall. Has helmet to protect left side of head that he was not wearing at the time. Denies any pain at time. Patient A&0 x 4 in triage.

## 2018-01-09 NOTE — ED Provider Notes (Signed)
Emergency Department Provider Note   I have reviewed the triage vital signs and the nursing notes.   HISTORY  Chief Complaint Fall   HPI Jeremy [name] is a 42 y.o. male with multiple medical problems as documented below but most notably for stroke back in January with right-sided deficits.  He also had a hemicraniectomy and is supposed to be wearing a helmet.  He was leaning forward today as part normal when he leaned over too far and fell forward hitting the right side of his head and his right elbow.  He had a 2 to 3-minute episode of unresponsiveness afterward where his eyes were rolled up in the top of his head.  When she woke up within a few minutes he was back to his normal self.  He has no complaints at this time.  No shaking activity when he was unresponsive per his wife's history.  Has neurologic symptoms are at his baseline.  No pain aside from his elbow. No other associated or modifying symptoms.    Past Medical History:  Diagnosis Date  . Anxiety   . Asthma    MILD AS A CHILD  . Depression   . Deviated septum   . Dyspnea    NORMAL SPIROMETRY 02/01/17 VISIT WITH DR HEDRICK  . Hypertension   . Sleep apnea   . Stroke [facility](HCC)     Patient Active Problem List   Diagnosis Date Noted  . PE (pulmonary thromboembolism) (HCC) 10/06/2017  . DVT (deep vein thrombosis) in pregnancy (HCC) 10/06/2017  . Acute blood loss anemia   . Vascular headache   . Spastic hemiplegia of right dominant side as late effect of cerebral infarction (HCC)   . Benign essential HTN   . Frontal lobe and executive function deficit following cerebral infarction   . Adjustment disorder with mixed anxiety and depressed mood   . 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 Aphasia due to acute stroke (HCC) 08/07/2017  . B12 deficiency   . Acute respiratory failure (HCC)   . Central line infiltration (HCC)   . Endotracheal tube present   . Fever   . Leukocytosis   . S/P craniotomy 07/30/2017    . Cerebral edema (HCC) 07/28/2017  . Hyperlipidemia 07/28/2017  . Stroke (cerebrum) (HCC) 07/27/2017    Past Surgical History:  Procedure Laterality Date  . CRANIOTOMY Left 07/29/2017   Procedure: LEFT HEMI- CRANIECTOMY WITH PLACEMENT OF BONE FLAP IN ABDOMEN;  Surgeon: Lisbeth RenshawNundkumar, Neelesh, MD;  Location: [facility] OR;  Service: Neurosurgery;  Laterality: Left;  . IR ANGIO INTRA EXTRACRAN SEL COM CAROTID INNOMINATE UNI R MOD SED  07/28/2017  . IR ANGIO VERTEBRAL SEL VERTEBRAL UNI R MOD SED  07/28/2017  . IR CT HEAD LTD  07/28/2017  . IR PERCUTANEOUS ART THROMBECTOMY/INFUSION INTRACRANIAL INC DIAG ANGIO  07/28/2017  . NASAL SEPTOPLASTY W/ TURBINOPLASTY Bilateral 03/06/2017   Procedure: NASAL SEPTOPLASTY WITH TURBINATE REDUCTION;  Surgeon: Linus SalmonsMcQueen, Chapman, MD;  Location: [facility] ORS;  Service: ENT;  Laterality: Bilateral;  . RADIOLOGY WITH ANESTHESIA N/A 07/27/2017   Procedure: RADIOLOGY WITH ANESTHESIA;  Surgeon: Julieanne Cottoneveshwar, Sanjeev, MD;  Location: [facility] OR;  Service: Radiology;  Laterality: N/A;    Current Outpatient Rx  . Order #: [id number] Class: Historical Med  . Order #: [id number] Class: Normal  . Order #: [id number] Class: Normal  . Order #: [id number] Class: Print  . Order #: [id number] Class: Normal  . Order #: [id number] Class: Historical Med  . Order #: [id number] Class: Historical Med  . Order #:  [id number] Class: Historical Med    Allergies Blood-group specific substance and Lipitor [atorvastatin calcium]  Family History  Problem Relation Age of Onset  . Hypertension Father   . Hypertension Brother     Social History Social History   Tobacco Use  . Smoking status: Never Smoker  . Smokeless tobacco: Never Used  Substance Use Topics  . Alcohol use: Yes    Comment: RARE  . Drug use: No    Review of Systems  All other systems negative except as documented in the HPI. All pertinent positives and negatives as reviewed in the HPI. ____________________________________________   PHYSICAL  EXAM:  VITAL SIGNS: ED Triage Vitals  Enc Vitals Group     BP 01/09/18 1855 118/85     Pulse Rate 01/09/18 1855 84     Resp 01/09/18 1855 16     Temp 01/09/18 1855 98.8 F (37.1 C)     Temp Source 01/09/18 1855 Oral     SpO2 01/09/18 1855 97 %     Weight 01/09/18 1903 195 lb (88.5 kg)     Height 01/09/18 1903 5\' 11"  (1.803 m)     Head Circumference --      Peak Flow --      Pain Score 01/09/18 1902 0     Pain Loc --      Pain Edu? --      Excl. in GC? --     Constitutional: Alert and oriented. Well appearing and in no acute distress. Eyes: Conjunctivae are normal. PERRL. EOMI. Head: obvious deformity to left side head. Nose: No congestion/rhinnorhea. Mouth/Throat: Mucous membranes are moist.  Oropharynx non-erythematous. Neck: No stridor.  No meningeal signs.   Cardiovascular: Normal rate, regular rhythm. Good peripheral circulation. Grossly normal heart sounds.   Respiratory: Normal respiratory effort.  No retractions. Lungs CTAB. Gastrointestinal: Soft and nontender. No distention.  Musculoskeletal: ttp to right elbow. Also ttp to midline spine and bilateral paraspinal muscles.  Neurologic:  Some slurred speech. Right facial droop. Did not further test right sided extremities for motor. His sensation is intact and at baeline.  Skin:  Skin is warm, dry and intact. No rash noted.  ____________________________________________   LABS (all labs ordered are listed, but only abnormal results are displayed)  Labs Reviewed  CBC WITH DIFFERENTIAL/PLATELET - Abnormal; Notable for the following components:      Result Value   Neutro Abs 8.0 (*)    All other components within normal limits  BASIC METABOLIC PANEL  CBG MONITORING, ED   ____________________________________________  EKG   EKG Interpretation  Date/Time:  Wednesday January 09 2018 18:52:57 EDT Ventricular Rate:  89 PR Interval:    QRS Duration: 96 QT Interval:  371 QTC Calculation: 452 R Axis:   64 Text  Interpretation:  Sinus rhythm No significant change since last tracing Confirmed by Marily Memos [phone]) on 01/09/2018 7:55:50 PM       ____________________________________________  RADIOLOGY  Dg Elbow Complete Right  Result Date: 01/09/2018 CLINICAL DATA:  42 year old male with right elbow pain. Evaluate for fracture. EXAM: RIGHT ELBOW - COMPLETE 3+ VIEW COMPARISON:  None. FINDINGS: There is no evidence of fracture, dislocation, or joint effusion. There is no evidence of arthropathy or other focal bone abnormality. Soft tissues are unremarkable. IMPRESSION: Negative. Electronically Signed   By: Elgie Collard M.D.   On: 01/09/2018 21:43   Ct Head Wo Contrast  Result Date: 01/09/2018 CLINICAL DATA:  Fall out of a chair. EXAM: CT HEAD WITHOUT CONTRAST  CT CERVICAL SPINE WITHOUT CONTRAST TECHNIQUE: Multidetector CT imaging of the head and cervical spine was performed following the standard protocol without intravenous contrast. Multiplanar CT image reconstructions of the cervical spine were also generated. COMPARISON:  CT head dated August 14, 2017. CT neck dated July 27, 2017. FINDINGS: CT HEAD FINDINGS Brain: Expected evolution of the large left MCA and ACA territory infarcts, now with encephalomalacia. Compensatory ex vacuo dilatation of the left lateral ventricle. There is 2 mm leftward midline shift, previously 4 mm. Atrophy and Wallerian degeneration of the left corticospinal tracts. No evidence of acute infarction, hemorrhage, hydrocephalus, extra-axial collection or mass lesion. Vascular: No hyperdense vessel or unexpected calcification. Skull: Postsurgical changes related to prior decompressive left hemicraniectomy with resolved overlying scalp soft tissue swelling. No fracture or focal lesion. Sinuses/Orbits: None. Other: None. CT CERVICAL SPINE FINDINGS Alignment: Straightening of the normal cervical lordosis. No traumatic malalignment. Skull base and vertebrae: No acute fracture. No  primary bone lesion or focal pathologic process. Soft tissues and spinal canal: No prevertebral fluid or swelling. No visible canal hematoma. Disc levels: Mild disc height loss and uncovertebral hypertrophy at C4-C5 and C5-C6. Upper chest: Negative. Other: Unchanged 2.6 cm right thyroid nodule. IMPRESSION: 1.  No acute intracranial abnormality. 2.  No acute cervical spine fracture. 3. Expected evolution of the large left MCA and ACA territory infarcts, now with encephalomalacia. 4. Unchanged 2.6 cm right thyroid nodule. Non-emergent thyroid ultrasound is recommended. This follows ACR consensus guidelines: Managing Incidental Thyroid Nodules Detected on Imaging: White Paper of the ACR Incidental Thyroid Findings Committee. J Am Coll Radiol 2015; 12:143-150. Electronically Signed   By: Obie Dredge M.D.   On: 01/09/2018 20:40   Ct Cervical Spine Wo Contrast  Result Date: 01/09/2018 CLINICAL DATA:  Fall out of a chair. EXAM: CT HEAD WITHOUT CONTRAST CT CERVICAL SPINE WITHOUT CONTRAST TECHNIQUE: Multidetector CT imaging of the head and cervical spine was performed following the standard protocol without intravenous contrast. Multiplanar CT image reconstructions of the cervical spine were also generated. COMPARISON:  CT head dated August 14, 2017. CT neck dated July 27, 2017. FINDINGS: CT HEAD FINDINGS Brain: Expected evolution of the large left MCA and ACA territory infarcts, now with encephalomalacia. Compensatory ex vacuo dilatation of the left lateral ventricle. There is 2 mm leftward midline shift, previously 4 mm. Atrophy and Wallerian degeneration of the left corticospinal tracts. No evidence of acute infarction, hemorrhage, hydrocephalus, extra-axial collection or mass lesion. Vascular: No hyperdense vessel or unexpected calcification. Skull: Postsurgical changes related to prior decompressive left hemicraniectomy with resolved overlying scalp soft tissue swelling. No fracture or focal lesion.  Sinuses/Orbits: None. Other: None. CT CERVICAL SPINE FINDINGS Alignment: Straightening of the normal cervical lordosis. No traumatic malalignment. Skull base and vertebrae: No acute fracture. No primary bone lesion or focal pathologic process. Soft tissues and spinal canal: No prevertebral fluid or swelling. No visible canal hematoma. Disc levels: Mild disc height loss and uncovertebral hypertrophy at C4-C5 and C5-C6. Upper chest: Negative. Other: Unchanged 2.6 cm right thyroid nodule. IMPRESSION: 1.  No acute intracranial abnormality. 2.  No acute cervical spine fracture. 3. Expected evolution of the large left MCA and ACA territory infarcts, now with encephalomalacia. 4. Unchanged 2.6 cm right thyroid nodule. Non-emergent thyroid ultrasound is recommended. This follows ACR consensus guidelines: Managing Incidental Thyroid Nodules Detected on Imaging: White Paper of the ACR Incidental Thyroid Findings Committee. J Am Coll Radiol 2015; 12:143-150. Electronically Signed   By: Obie Dredge M.D.   On:  01/09/2018 20:40    ____________________________________________   PROCEDURES  Procedure(s) performed:   Procedures   ____________________________________________   INITIAL IMPRESSION / ASSESSMENT AND PLAN / ED COURSE  eval for traumatic injuries of head/c spine and right elbow. Suspect syncope vs seizure from the fall rather than primary syncope causing fall however will eval for both.   Work-up unremarkable.  Case management involved to starting physical therapy at home.  No indication for admission further work-up at this time.  Stable for discharge.     Pertinent labs & imaging results that were available during my care of the patient were reviewed by me and considered in my medical decision making (see chart for details).  ____________________________________________  FINAL CLINICAL IMPRESSION(S) / ED DIAGNOSES  Final diagnoses:  Fall, initial encounter     MEDICATIONS GIVEN  DURING THIS VISIT:  Medications - No data to display   NEW OUTPATIENT MEDICATIONS STARTED DURING THIS VISIT:  Discharge Medication List as of 01/09/2018 10:07 PM      Note:  This note was prepared with assistance of Dragon voice recognition software. Occasional wrong-word or sound-a-like substitutions may have occurred due to the inherent limitations of voice recognition software.   Marily Memos, MD 01/09/18 [phone]

## 2018-01-10 ENCOUNTER — Ambulatory Visit: Payer: BLUE CROSS/BLUE SHIELD | Admitting: Physical Therapy

## 2018-02-02 ENCOUNTER — Other Ambulatory Visit: Payer: Self-pay | Admitting: Physical Medicine & Rehabilitation

## 2018-02-06 NOTE — Progress Notes (Signed)
[facility] [address] [place]. [place] [address] [phone]       OFFICE FOLLOW-UP NOTE  Jeremy. Jeremy [name] Date of Birth:  06/29/1976 Medical Record Number:  [id number]   HPI: Jeremy [name] is a pleasant 42 year old Asian male seen today for first office follow-up visit following hospital admission for stroke in January 2019. He is accompanied by his wife. History is obtained from them as well as review of electronic medical records. I personally reviewed imaging films.Jeremy [name] is a 42 y.o. male past history of hypertension, on antihypertensives, who was in usual state of health last seen normal at 8 PM today by his wife, was noted to fall off of bed around 8:10 PM and found to have left gaze preference, right facial droop, slurred speech and right-sided weakness.  EMS arrived on site, blood pressure was in the normal range.  CBG was in the normal range.  He had dense right hemiplegia, right-sided neglect, left gaze preference, right facial droop and initially incoherent speech but then only slurred speech. He was brought into the emergency room at [facility] and evaluated on the bridge.  On arrival, initial NIH stroke scale was 20.  Noncontrast CT of the head was negative for bleed.  It did show a dense left MCA and and aspects of 8.  IV TPA was initiated at 9:11 PM.  CT angiogram and perfusion study were done.  Confirmed the left MCA occlusion.  Endovascular team was consulted even prior to obtaining the CT angiogram and the patient was taken in for endovascular thrombectomy.  LKW: 8 PM on 07/27/2017 tpa given?: yes Premorbid modified Rankin scale (mRS): 0 he had unsuccessful  mechanical embolectomy with small subarachnoid hemorrhage and cytotoxic edema  needing emergent hemicraniectomy on 07/29/2017 by Dr.Nundkumar with placement of the bone flap in the abdomen. Patient was admitted in the neurological intensive care unit where he initially needed mechanical ventilation and hypertonic  saline. These were gradually withdrawn as his condition improved. He is transferred to inpatient rehabilitation and has done well and is currently at home with his wife. He developed deep and thrombosis in the left eye in March and has been started on eliquis. Patient is able to walk with a walker. He is states his balance is not great but he is had no falls or injuries. He does need help with some activities of daily living but is able to feed himself. He can be left alone for hours. He needs some help with transfers.She is able to speak well and has good understanding and no cognitive deficits. His blood pressure is well controlled today it is 128/95. He has finished home physical therapy and plans to start outpatient physical and occupational therapy in [place] soon. Transesophageal echocardiogram was attempted while he was in the hospital but was unsuccessful.The etiology of his stroke is still unclear and now with the diagnosis of DVT possibility of paradoxical embolism and PFO is to be ruled out. He denies any prior history of DVT pulmonary embolism or family history of strokes or heart attacks at a young age.  02/07/18 UPDATE: Patient is being seen today for follow-up appointment and is accompanied by his wife.  He continues to have right hemiparesis with improvement compared to last visit.  He is currently walking with assistance of walker and foot drop brace on right lower extremity.  Per wife, insurances not allowing additional therapy visits but will be going to [employer] at their physical therapy school next  week for additional therapy.  He does state he does exercises at home on his own.  He continues to take both aspirin and Eliquis without side effects of bleeding or bruising.  Continues to take Crestor and Zetia without side effects myalgias.  Blood pressure today satisfactory 122/82.  He does follow with Dr. Clementeen Hoof for Botox injections and will be seeing him on Monday for  additional injections.  Patient and wife questioning clearance for replacement of skull cap and this office did receive clearance paperwork.  Patient did have recent fall while him and his wife were out to dinner and he was not wearing his helmet.  Per notes, patient may have lost consciousness but was fully assessed in the ED and this did not cause complications.  Patient was discharged back home.  Denies new or worsening stroke/TIA symptoms.    ROS:   14 system review of systems is positive for weakness and walking difficulty and all other systems negative PMH:  Past Medical History:  Diagnosis Date  . Anxiety   . Asthma    MILD AS A CHILD  . Depression   . Deviated septum   . Dyspnea    NORMAL SPIROMETRY 02/01/17 VISIT WITH DR HEDRICK  . Hypertension   . Sleep apnea   . Stroke [facility])     Social History:  Social History   Socioeconomic History  . Marital status: Married    Spouse name: Not on file  . Number of children: Not on file  . Years of education: Not on file  . Highest education level: Not on file  Occupational History  . Not on file  Social Needs  . Financial resource strain: Not on file  . Food insecurity:    Worry: Not on file    Inability: Not on file  . Transportation needs:    Medical: Not on file    Non-medical: Not on file  Tobacco Use  . Smoking status: Never Smoker  . Smokeless tobacco: Never Used  Substance and Sexual Activity  . Alcohol use: Not Currently    Comment: RARE  . Drug use: No  . Sexual activity: Not on file  Lifestyle  . Physical activity:    Days per week: Not on file    Minutes per session: Not on file  . Stress: Not on file  Relationships  . Social connections:    Talks on phone: Not on file    Gets together: Not on file    Attends religious service: Not on file    Active member of club or organization: Not on file    Attends meetings of clubs or organizations: Not on file    Relationship status: Not on file  . Intimate  partner violence:    Fear of current or ex partner: Not on file    Emotionally abused: Not on file    Physically abused: Not on file    Forced sexual activity: Not on file  Other Topics Concern  . Not on file  Social History Narrative  . Not on file    Medications:   Current Outpatient Medications on File Prior to Visit  Medication Sig Dispense Refill  . amLODipine (NORVASC) 5 MG tablet Take 5 mg by mouth daily.     Marland Kitchen aspirin EC 81 MG tablet Take 1 tablet (81 mg total) by mouth daily. 30 tablet 0  . ELIQUIS STARTER PACK (ELIQUIS STARTER PACK) 5 MG TABS Take as directed on package: start with two-5mg   tablets twice daily for 7 days. On day 8, switch to one-5mg  tablet twice daily. (Patient taking differently: 5 mg. Take as directed on package: start with two-5mg  tablets twice daily for 7 days. On day 8, switch to one-5mg  tablet twice daily.) 1 each 0  . escitalopram (LEXAPRO) 10 MG tablet Take 1 tablet (10 mg total) by mouth daily. 30 tablet 0  . ezetimibe (ZETIA) 10 MG tablet Take by mouth.    . gabapentin (NEURONTIN) 400 MG capsule TAKE ONE CAPSULE BY MOUTH THREE TIMES A DAY 90 capsule 0  . loratadine (CLARITIN) 10 MG tablet Take 10 mg by mouth daily.    . rosuvastatin (CRESTOR) 10 MG tablet Take 10 mg by mouth daily.    Marland Kitchen spironolactone (ALDACTONE) 50 MG tablet Take 50 mg by mouth daily.      No current facility-administered medications on file prior to visit.     Allergies:   Allergies  Allergen Reactions  . Blood-Group Specific Substance Other (See Comments)    PT [facility] DOES NOT ACCEPT BLOOD PRODUCTS  . Lipitor [Atorvastatin Calcium] Other (See Comments)    myalgia    Physical Exam General: well developed, well nourished middle-aged Asian male, seated, in no evident distress Head: head normocephalic and atraumatic.  Neck: supple with no carotid or supraclavicular bruits Cardiovascular: regular rate and rhythm, no murmurs Musculoskeletal: no deformity Skin:  no  rash/petichiae Vascular:  Normal pulses all extremities Vitals:   02/07/18 1053  BP: 122/82  Pulse: 75   Neurologic Exam Mental Status: Awake and fully alert. Oriented to place and time. Recent and remote memory intact. Attention span, concentration and fund of knowledge appropriate. Mood and affect appropriate. No dysarthria or aphasia. Able to name ,comprehend and repeat well Cranial Nerves: Fundoscopic exam reveals sharp disc margins. Pupils equal, briskly reactive to light. Extraocular movements full without nystagmus. Visual fields full to confrontation. Hearing intact. Facial sensation intact. Moderate right lower facial weakness., tongue, palate moves normally and symmetrically.  Motor: spastic right hemiplegia with right upper and lower extremity drift. 0/5 strength in the right upper extremity. 3/5 strength in the right lower extremity with hip flexor and ankle dorsiflexor weakness. Tone is increased on the right.  Sensory.: intact to touch ,pinprick .position and vibratory sensation.  Coordination: impaired on the right and normal on the left. Gait and Station: With assistance of quad cane, patient has hemiplegic gait on right side but is able to ambulate without assistance Reflexes: 2+ and asymmetric and brisker on the right.. Toes downgoing.     ASSESSMENT: 42 year old Asian male with large left middle cerebral artery infarct  with cytotoxic edemaof cryptogenic etiology status post hemicraniectomy and January 2019 with residual spastic right hemiplegia who is otherwise doing well.  Patient returns today for follow-up appointment and for clearance of cranioplasty.    PLAN: -Continue aspirin 81 mg daily and Eliquis (apixaban) daily  and Crestor for secondary stroke prevention -Wife questioning how long to keep on both aspirin and Eliquis as Eliquis was started in 09/2017 for DVT.  Advised wife and patient that typically patient stay on anticoagulation for 6 months for DVT treatment  but will discuss this with Dr. Pearlean Brownie -Patient is cleared to undergo cranioplasty and recommend stopping Eliquis 3 days prior to procedure.  He will be decided by surgeon if patient should stop aspirin as well and if so recommend to stop this 3 days prior as well and start both Eliquis and aspirin immediately after procedure once hemodynamically  stable -F/u with PCP regarding your HLD and HTN management -continue to monitor BP at home -Continue home exercises as tolerated -Maintain strict control of hypertension with blood pressure goal below 130/90, diabetes with hemoglobin A1c goal below 6.5% and cholesterol with LDL cholesterol (bad cholesterol) goal below 70 mg/dL. I also advised the patient to eat a healthy diet with plenty of whole grains, cereals, fruits and vegetables, exercise regularly and maintain ideal body weight.  Follow up in 6 months or call earlier if needed  Greater than 50% of time during this 25 minute visit was spent on counseling,explanation of diagnosis MCA infarct, hemicraniectomy, planning of further management, discussion with patient and family and coordination of care  George Hugh, [facility]  [facility] [address] [place], [place] [address]  Phone [phone] Fax [phone] Note: This document was prepared with digital dictation and possible smart phrase technology. Any transcriptional errors that result from this process are unintentional.

## 2018-02-07 ENCOUNTER — Encounter: Payer: Self-pay | Admitting: Adult Health

## 2018-02-07 ENCOUNTER — Telehealth: Payer: Self-pay

## 2018-02-07 ENCOUNTER — Ambulatory Visit: Payer: Self-pay | Admitting: Adult Health

## 2018-02-07 VITALS — BP 122/82 | HR 75 | Wt 194.0 lb

## 2018-02-07 DIAGNOSIS — I639 Cerebral infarction, unspecified: Secondary | ICD-10-CM

## 2018-02-07 DIAGNOSIS — R4701 Aphasia: Secondary | ICD-10-CM

## 2018-02-07 DIAGNOSIS — I63512 Cerebral infarction due to unspecified occlusion or stenosis of left middle cerebral artery: Secondary | ICD-10-CM

## 2018-02-07 DIAGNOSIS — Z9889 Other specified postprocedural states: Secondary | ICD-10-CM

## 2018-02-07 DIAGNOSIS — I10 Essential (primary) hypertension: Secondary | ICD-10-CM

## 2018-02-07 DIAGNOSIS — I69351 Hemiplegia and hemiparesis following cerebral infarction affecting right dominant side: Secondary | ICD-10-CM

## 2018-02-07 DIAGNOSIS — E785 Hyperlipidemia, unspecified: Secondary | ICD-10-CM

## 2018-02-07 NOTE — Progress Notes (Signed)
I agree with the above plan 

## 2018-02-07 NOTE — Telephone Encounter (Signed)
Clearance form fax to [facility] at [phone]. Form fax twice and confirmed.

## 2018-02-07 NOTE — Patient Instructions (Addendum)
Continue aspirin 81 mg daily and Eliquis (apixaban) daily  and crestor  for secondary stroke prevention  Clearance form will be sent for neurosurgery clearance - it will be advised to stop Eliquis 3 days prior to procedure. It will be up to the surgeon if aspirin needs to be stopped as well. If aspirin needs to be stopped, recommend stopping 3 days prior as well. Can restart both immediately following procedure once hemodynamically stable.   Continue to follow up with PCP regarding cholesterol and blood pressure management   Continue to monitor blood pressure at home  Continue home exercises for continued improvement with your strength  Maintain strict control of hypertension with blood pressure goal below 130/90, diabetes with hemoglobin A1c goal below 6.5% and cholesterol with LDL cholesterol (bad cholesterol) goal below 70 mg/dL. I also advised the patient to eat a healthy diet with plenty of whole grains, cereals, fruits and vegetables, exercise regularly and maintain ideal body weight.  Followup in the future with me in 6 months or call earlier if needed        Thank you for coming to see [facility] at [facility]. I hope we have been able to provide you high quality care today.  You may receive a patient satisfaction survey over the next few weeks. We would appreciate your feedback and comments so that we may continue to improve ourselves and the health of our patients.

## 2018-02-08 ENCOUNTER — Telehealth: Payer: Self-pay | Admitting: Physical Medicine & Rehabilitation

## 2018-02-08 NOTE — Telephone Encounter (Signed)
I left Jeremy [name] a message that unfortunately we do not have samples of that mediation and the medication was ordered by another MD--maybe his neurologist.

## 2018-02-08 NOTE — Telephone Encounter (Signed)
Pts [employer] has dropped him & he has been awarded a disability program.   The pharmacy has asked him to call his drs & ck to see if theres samples OR discount cards that would help meds. They were able to get most of the meds down to a reasonable except for the Eliquist..Marland Kitchen.He needs refill for this rx by tomorrow!  Would you check this & give the wife Shawna OrleansMelanie a call.  Thanks, Rosezella FloridaLisa M.

## 2018-02-11 ENCOUNTER — Ambulatory Visit: Payer: Self-pay | Admitting: Physical Medicine & Rehabilitation

## 2018-02-21 ENCOUNTER — Encounter: Payer: Self-pay | Admitting: Physical Medicine & Rehabilitation

## 2018-02-21 ENCOUNTER — Encounter: Payer: BLUE CROSS/BLUE SHIELD | Attending: Registered Nurse

## 2018-02-21 ENCOUNTER — Ambulatory Visit (HOSPITAL_BASED_OUTPATIENT_CLINIC_OR_DEPARTMENT_OTHER): Payer: BLUE CROSS/BLUE SHIELD | Admitting: Physical Medicine & Rehabilitation

## 2018-02-21 VITALS — BP 124/87 | HR 86 | Ht 71.0 in | Wt 184.0 lb

## 2018-02-21 DIAGNOSIS — F329 Major depressive disorder, single episode, unspecified: Secondary | ICD-10-CM | POA: Diagnosis not present

## 2018-02-21 DIAGNOSIS — G441 Vascular headache, not elsewhere classified: Secondary | ICD-10-CM | POA: Diagnosis not present

## 2018-02-21 DIAGNOSIS — J45909 Unspecified asthma, uncomplicated: Secondary | ICD-10-CM | POA: Diagnosis not present

## 2018-02-21 DIAGNOSIS — G4730 Sleep apnea, unspecified: Secondary | ICD-10-CM | POA: Insufficient documentation

## 2018-02-21 DIAGNOSIS — Z9889 Other specified postprocedural states: Secondary | ICD-10-CM | POA: Insufficient documentation

## 2018-02-21 DIAGNOSIS — I63512 Cerebral infarction due to unspecified occlusion or stenosis of left middle cerebral artery: Secondary | ICD-10-CM | POA: Insufficient documentation

## 2018-02-21 DIAGNOSIS — G8110 Spastic hemiplegia affecting unspecified side: Secondary | ICD-10-CM | POA: Diagnosis not present

## 2018-02-21 DIAGNOSIS — F419 Anxiety disorder, unspecified: Secondary | ICD-10-CM | POA: Insufficient documentation

## 2018-02-21 DIAGNOSIS — I10 Essential (primary) hypertension: Secondary | ICD-10-CM | POA: Insufficient documentation

## 2018-02-21 NOTE — Patient Instructions (Signed)

## 2018-02-25 ENCOUNTER — Ambulatory Visit: Payer: Self-pay | Admitting: Physical Medicine & Rehabilitation

## 2018-03-05 ENCOUNTER — Other Ambulatory Visit: Payer: Self-pay | Admitting: Neurosurgery

## 2018-03-12 NOTE — Pre-Procedure Instructions (Signed)
    Rae LipsJao Wessels  03/12/2018      [facility] [place], [place] - [id number] [address] [place] [address] Phone: [phone] Fax: [phone]    Your procedure is scheduled on   Tuesday 03/19/18  [facility] Ad[facility] at [employer] A.M.  Call this number if you have problems the morning of surgery:  [phone]   Remember:  Do not eat or drink after midnight.     Take these medicines the morning of surgery with A SIP OF WATER-  AMLODIPINE (NORVASC), ESCITALOPRAM (LEXAPRO), GABAPENTIN  7 days prior to surgery STOP taking any Aspirin(unless otherwise instructed by your surgeon), Aleve, Naproxen, Ibuprofen, Motrin, Advil, Goody's, BC's, all herbal medications, fish oil, and all vitamins    Do not wear jewelry, make-up or nail polish.  Do not wear lotions, powders, or perfumes, or deodorant.  Do not shave 48 hours prior to surgery.  Men may shave face and neck.  Do not bring valuables to the hospital.  [name] is not responsible for any belongings or valuables.  Contacts, dentures or bridgework may not be worn into surgery.  Leave your suitcase in the car.  After surgery it may be brought to your room.  For patients admitted to the hospital, discharge time will be determined by your treatment team.  Patients discharged the day of surgery will not be allowed to drive home.   Name and phone number of your driver:   Special instructions:    Please read over the following fact sheets that you were given. Pain Booklet, MRSA Information and Surgical Site Infection Prevention

## 2018-03-13 ENCOUNTER — Encounter (HOSPITAL_COMMUNITY): Payer: Self-pay

## 2018-03-13 ENCOUNTER — Encounter (HOSPITAL_COMMUNITY)
Admission: RE | Admit: 2018-03-13 | Discharge: 2018-03-13 | Disposition: A | Discharge status: Home / Self Care | Payer: BLUE CROSS/BLUE SHIELD | Source: Ambulatory Visit | Attending: Neurosurgery | Admitting: Neurosurgery

## 2018-03-13 ENCOUNTER — Other Ambulatory Visit: Payer: Self-pay

## 2018-03-13 DIAGNOSIS — Z01812 Encounter for preprocedural laboratory examination: Secondary | ICD-10-CM | POA: Diagnosis present

## 2018-03-13 LAB — BASIC METABOLIC PANEL
ANION GAP: 13 (ref 5–15)
BUN: 21 mg/dL — ABNORMAL HIGH (ref 6–20)
CALCIUM: 10 mg/dL (ref 8.9–10.3)
CO2: 24 mmol/L (ref 22–32)
Chloride: 100 mmol/L (ref 98–111)
Creatinine, Ser: 1.21 mg/dL (ref 0.61–1.24)
GFR calc Af Amer: >60 mL/min (ref >60)
GFR calc non Af Amer: >60 mL/min (ref >60)
GLUCOSE: 111 mg/dL — AB (ref 70–99)
Potassium: 4.3 mmol/L (ref 3.5–5.1)
Sodium: 137 mmol/L (ref 135–145)

## 2018-03-13 LAB — CBC
HCT: 49 % (ref 39.0–52.0)
HEMOGLOBIN: 16.1 g/dL (ref 13.0–17.0)
MCH: 30.5 pg (ref 26.0–34.0)
MCHC: 32.9 g/dL (ref 30.0–36.0)
MCV: 92.8 fL (ref 78.0–100.0)
Platelets: 264 10*3/uL (ref 150–400)
RBC: 5.28 MIL/uL (ref 4.22–5.81)
RDW: 12.4 % (ref 11.5–15.5)
WBC: 6.6 10*3/uL (ref 4.0–10.5)

## 2018-03-13 LAB — NO BLOOD PRODUCTS

## 2018-03-13 NOTE — Pre-Procedure Instructions (Signed)
Jeremy [name]  03/13/2018      Jeremy [name] [facility] [place], [place] - [address] [place] [address] Phone: [phone] Fax: [phone]    Your procedure is scheduled on Tuesday September 3rd.  Report to [facility] Admitting at [employer] A.M.  Call this number if you have problems the morning of surgery:  [phone]   Remember:  Do not eat or drink after midnight.      Take these medicines the morning of surgery with A SIP OF WATER   AMLODIPINE (NORVASC)  ESCITALOPRAM (LEXAPRO)  GABAPENTIN    Do not wear jewelry  Do not wear lotions, powders, or colognes, or deodorant.  Do not shave 48 hours prior to surgery.  Men may shave face and neck.  Do not bring valuables to the hospital.  [facility] is not responsible for any belongings or valuables.  Contacts, dentures or bridgework may not be worn into surgery.  Leave your suitcase in the car.  After surgery it may be brought to your room.  For patients admitted to the hospital, discharge time will be determined by your treatment team.  Patients discharged the day of surgery will not be allowed to drive home.    [place]- Preparing For Surgery  Before surgery, you can play an important role. Because skin is not sterile, your skin needs to be as free of germs as possible. You can reduce the number of germs on your skin by washing with CHG (chlorahexidine gluconate) Soap before surgery.  CHG is an antiseptic cleaner which kills germs and bonds with the skin to continue killing germs even after washing.    Oral Hygiene is also important to reduce your risk of infection.  Remember - BRUSH YOUR TEETH THE MORNING OF SURGERY WITH YOUR REGULAR TOOTHPASTE  Please do not use if you have an allergy to CHG or antibacterial soaps. If your skin becomes reddened/irritated stop using the CHG.  Do not shave (including legs and underarms) for at least 48 hours prior to first CHG shower.  It is OK to shave your face.  Please follow these instructions carefully.   1. Shower the NIGHT BEFORE SURGERY and the MORNING OF SURGERY with CHG.   2. If you chose to wash your hair, wash your hair first as usual with your normal shampoo.  3. After you shampoo, rinse your hair and body thoroughly to remove the shampoo.  4. Use CHG as you would any other liquid soap. You can apply CHG directly to the skin and wash gently with a scrungie or a clean washcloth.   5. Apply the CHG Soap to your body ONLY FROM THE NECK DOWN.  Do not use on open wounds or open sores. Avoid contact with your eyes, ears, mouth and genitals (private parts). Wash Face and genitals (private parts)  with your normal soap.  6. Wash thoroughly, paying special attention to the area where your surgery will be performed.  7. Thoroughly rinse your body with warm water from the neck down.  8. DO NOT shower/wash with your normal soap after using and rinsing off the CHG Soap.  9. Pat yourself dry with a CLEAN TOWEL.  10. Wear CLEAN PAJAMAS to bed the night before surgery, wear comfortable clothes the morning of surgery  11. Place CLEAN SHEETS on your bed the night of your first shower and DO NOT SLEEP WITH PETS.    Day of Surgery:  Do not apply any deodorants/lotions.  Please wear clean clothes to the hospital/surgery center.   Remember to brush your teeth WITH YOUR REGULAR TOOTHPASTE.    Please read over the following fact sheets that you were given. Coughing and Deep Breathing, MRSA Information and Surgical Site Infection Prevention

## 2018-03-13 NOTE — Progress Notes (Signed)
PCP - Jerl MinaJames Hedrick   EKG - 01/09/18   CPAP - yes but does not use since after first surgery    Blood Thinner Instructions: Last dose will be on 03/15/18 Aspirin Instructions: Last dose will be on 03/15/18 Anesthesia review: none  Patient denies shortness of breath, fever, cough and chest pain at PAT appointment   Patient verbalized understanding of instructions that were given to them at the PAT appointment. Patient was also instructed that they will need to review over the PAT instructions again at home before surgery.

## 2018-03-13 NOTE — Progress Notes (Signed)
Called Dr. Lonie Peakram's office to inform them pt does not wish to receive blood or blood products. Dr. Lonie Peakram's office stated they would make a note on pt's chart.

## 2018-03-16 ENCOUNTER — Other Ambulatory Visit: Payer: Self-pay | Admitting: Physical Medicine & Rehabilitation

## 2018-03-18 NOTE — H&P (Addendum)
Chief Complaint   Stroke follow up  HPI   HPI: Jeremy [name] is a 42 y.o. male who suffered a left middle cerebral artery territory stroke and underwent left hemi craniectomy for malignant edema with intracranial hypertension back in January.  He has made a reasonable recovery after rehab.  He is walking with the assistance of an AFO and rolling walker.   Unfortunately, he was diagnosed with a DVT with PE in March. He was placed on Eliquis but has been off anticoagulation for at least 3 months.  He presents today for cranioplasty.  He is without any concerns.  Patient Active Problem List   Diagnosis Date Noted  . PE (pulmonary thromboembolism) (HCC) 10/06/2017  . DVT (deep vein thrombosis) in pregnancy (HCC) 10/06/2017  . Acute blood loss anemia   . Vascular headache   . Spastic hemiplegia of right dominant side as late effect of cerebral infarction (HCC)   . Benign essential HTN   . Frontal lobe and executive function deficit following cerebral infarction   . Adjustment disorder with mixed anxiety and depressed mood   . 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 Aphasia due to acute stroke (HCC) 08/07/2017  . B12 deficiency   . Acute respiratory failure (HCC)   . Central line infiltration (HCC)   . Endotracheal tube present   . Fever   . Leukocytosis   . S/P craniotomy 07/30/2017  . Cerebral edema (HCC) 07/28/2017  . Hyperlipidemia 07/28/2017  . Stroke (cerebrum) (HCC) 07/27/2017    PMH: Past Medical History:  Diagnosis Date  . Asthma    MILD AS A CHILD  . Deviated septum   . Dyspnea    NORMAL SPIROMETRY 02/01/17 VISIT WITH DR HEDRICK  . Hypertension   . Sleep apnea   . Stroke [facility])     PSH: Past Surgical History:  Procedure Laterality Date  . CRANIOTOMY Left 07/29/2017   Procedure: LEFT HEMI- CRANIECTOMY WITH PLACEMENT OF BONE FLAP IN ABDOMEN;  Surgeon: Lisbeth Renshaw, MD;  Location: [facility] OR;  Service: Neurosurgery;  Laterality: Left;  . IR ANGIO INTRA  EXTRACRAN SEL COM CAROTID INNOMINATE UNI R MOD SED  07/28/2017  . IR ANGIO VERTEBRAL SEL VERTEBRAL UNI R MOD SED  07/28/2017  . IR CT HEAD LTD  07/28/2017  . IR PERCUTANEOUS ART THROMBECTOMY/INFUSION INTRACRANIAL INC DIAG ANGIO  07/28/2017  . NASAL SEPTOPLASTY W/ TURBINOPLASTY Bilateral 03/06/2017   Procedure: NASAL SEPTOPLASTY WITH TURBINATE REDUCTION;  Surgeon: Linus Salmons, MD;  Location: [facility] ORS;  Service: ENT;  Laterality: Bilateral;  . RADIOLOGY WITH ANESTHESIA N/A 07/27/2017   Procedure: RADIOLOGY WITH ANESTHESIA;  Surgeon: Julieanne Cotton, MD;  Location: [facility] OR;  Service: Radiology;  Laterality: N/A;    Medications Prior to Admission  Medication Sig Dispense Refill Last Dose  . amLODipine (NORVASC) 5 MG tablet Take 5 mg by mouth daily.    03/18/2018 at Unknown time  . aspirin EC 81 MG tablet Take 1 tablet (81 mg total) by mouth daily. 30 tablet 0 03/15/2018  . ELIQUIS STARTER PACK (ELIQUIS STARTER PACK) 5 MG TABS Take as directed on package: start with two-5mg  tablets twice daily for 7 days. On day 8, switch to one-5mg  tablet twice daily. (Patient taking differently: Take 5 mg by mouth 2 (two) times daily. ) 1 each 0 03/15/2018  . escitalopram (LEXAPRO) 10 MG tablet Take 1 tablet (10 mg total) by mouth daily. 30 tablet 0 03/19/2018 at 1000  . ezetimibe (ZETIA) 10 MG tablet Take  by mouth.   03/18/2018 at Unknown time  . gabapentin (NEURONTIN) 400 MG capsule TAKE ONE CAPSULE BY MOUTH THREE TIMES A DAY 90 capsule 0 03/19/2018 at 1000  . loratadine (CLARITIN) 10 MG tablet Take 10 mg by mouth daily.   03/18/2018 at Unknown time  . rosuvastatin (CRESTOR) 10 MG tablet Take 10 mg by mouth daily.   03/18/2018 at Unknown time  . spironolactone (ALDACTONE) 50 MG tablet Take 100 mg by mouth daily.    03/18/2018 at Unknown time    SH: Social History   Tobacco Use  . Smoking status: Never Smoker  . Smokeless tobacco: Never Used  Substance Use Topics  . Alcohol use: Not Currently    Comment: RARE  . Drug  use: No    MEDS: Prior to Admission medications   Medication Sig Start Date End Date Taking? Authorizing Provider  amLODipine (NORVASC) 5 MG tablet Take 5 mg by mouth daily.    Yes [provider]  aspirin EC 81 MG tablet Take 1 tablet (81 mg total) by mouth daily. 10/08/17  Yes Maxie Barb, MD  ELIQUIS STARTER PACK (ELIQUIS STARTER PACK) 5 MG TABS Take as directed on package: start with two-5mg  tablets twice daily for 7 days. On day 8, switch to one-5mg  tablet twice daily. Patient taking differently: Take 5 mg by mouth 2 (two) times daily.  10/08/17  Yes Maxie Barb, MD  escitalopram (LEXAPRO) 10 MG tablet Take 1 tablet (10 mg total) by mouth daily. 09/07/17  Yes Angiulli, Mcarthur Rossetti, PA-C  ezetimibe (ZETIA) 10 MG tablet Take by mouth. 01/31/18  Yes [provider]  gabapentin (NEURONTIN) 400 MG capsule TAKE ONE CAPSULE BY MOUTH THREE TIMES A DAY 02/04/18  Yes Kirsteins, Victorino Sparrow, MD  loratadine (CLARITIN) 10 MG tablet Take 10 mg by mouth daily.   Yes [provider]  rosuvastatin (CRESTOR) 10 MG tablet Take 10 mg by mouth daily.   Yes [provider]  spironolactone (ALDACTONE) 50 MG tablet Take 100 mg by mouth daily.    Yes [provider]    ALLERGY: Allergies  Allergen Reactions  . Blood-Group Specific Substance Other (See Comments)    PT [facility] DOES NOT ACCEPT BLOOD PRODUCTS  . Lipitor [Atorvastatin Calcium] Other (See Comments)    myalgia    Social History   Tobacco Use  . Smoking status: Never Smoker  . Smokeless tobacco: Never Used  Substance Use Topics  . Alcohol use: Not Currently    Comment: RARE     Family History  Problem Relation Age of Onset  . Hypertension Father   . Hypertension Brother      ROS   ROS  Exam   Vitals:   03/19/18 1152  BP: (!) 134/91  Pulse: 74  Resp: 18  Temp: 98.4 F (36.9 C)  SpO2: 99%   General appearance: NAD Eyes: PERRL, Fundoscopic:  normal Cardiovascular: Regular rate and rhythm without murmurs, rubs, gallops. No edema or variciosities. Distal pulses normal. Pulmonary: Clear to auscultation Musculoskeletal:     Muscle tone upper extremities: Normal    Muscle tone lower extremities: Normal    Motor exam:  Right Left Deltoid: 3/5 5/5 Biceps: 3/5 5/5 Triceps: 3/5 5/5 Wrist Extensor: 2/5 5/5 Grip: 2/5 5/5 Finger Extensor: 2/5 5/5 Hip Flexor: 4-/5 5/5 Knee Extensor: 4-/5 5/5 Knee Flexor: 4-/5 5/5 Tib Anterior: 2/5 5/5 EHL: 1/5 5/5 Medial Gastroc: 4/5 5/5   Neurological Awake, alert, oriented Sensation grossly intact to  LT  Results - Imaging/Labs   Results for orders placed or performed during the hospital encounter of 03/19/18 (from the past 48 hour(s))  PT- INR Day of Surgery     Status: None   Collection Time: 03/19/18 11:46 AM  Result Value Ref Range   Prothrombin Time 13.1 11.4 - 15.2 seconds   INR 1.00     Comment: Performed at [facility], [address], [place], [place] [address]    No results found.  IMAGING: None  Impression/Plan   42 y.o. male approximately 8 months status post left craniectomy for edema after left MCA stroke.    He has been on anticoagulation for at least 3 months.  He is a candidate for cranioplasty at this point.    While in the office risks, benefits and alternatives to the procedure were discussed.  Patient his wife state understanding and wished to proceed.  Consent has been signed.

## 2018-03-19 ENCOUNTER — Inpatient Hospital Stay (HOSPITAL_COMMUNITY)
Admission: RE | Admit: 2018-03-19 | Discharge: 2018-03-20 | DRG: 982 | Disposition: A | Discharge status: Home / Self Care | Payer: BLUE CROSS/BLUE SHIELD | Source: Ambulatory Visit | Attending: Neurosurgery | Admitting: Neurosurgery

## 2018-03-19 ENCOUNTER — Encounter (HOSPITAL_COMMUNITY)
Admission: RE | Disposition: A | Discharge status: Home / Self Care | Payer: Self-pay | Source: Ambulatory Visit | Attending: Neurosurgery

## 2018-03-19 ENCOUNTER — Other Ambulatory Visit: Payer: Self-pay

## 2018-03-19 ENCOUNTER — Inpatient Hospital Stay (HOSPITAL_COMMUNITY): Payer: BLUE CROSS/BLUE SHIELD | Admitting: Certified Registered Nurse Anesthetist

## 2018-03-19 ENCOUNTER — Encounter (HOSPITAL_COMMUNITY): Payer: Self-pay | Admitting: Certified Registered Nurse Anesthetist

## 2018-03-19 DIAGNOSIS — G4730 Sleep apnea, unspecified: Secondary | ICD-10-CM | POA: Diagnosis present

## 2018-03-19 DIAGNOSIS — Z428 Encounter for other plastic and reconstructive surgery following medical procedure or healed injury: Principal | ICD-10-CM

## 2018-03-19 DIAGNOSIS — Z79899 Other long term (current) drug therapy: Secondary | ICD-10-CM | POA: Diagnosis not present

## 2018-03-19 DIAGNOSIS — I69920 Aphasia following unspecified cerebrovascular disease: Secondary | ICD-10-CM

## 2018-03-19 DIAGNOSIS — Z9889 Other specified postprocedural states: Secondary | ICD-10-CM

## 2018-03-19 DIAGNOSIS — Z7901 Long term (current) use of anticoagulants: Secondary | ICD-10-CM | POA: Diagnosis not present

## 2018-03-19 DIAGNOSIS — Z86711 Personal history of pulmonary embolism: Secondary | ICD-10-CM | POA: Diagnosis not present

## 2018-03-19 DIAGNOSIS — M952 Other acquired deformity of head: Secondary | ICD-10-CM | POA: Diagnosis present

## 2018-03-19 DIAGNOSIS — Z86718 Personal history of other venous thrombosis and embolism: Secondary | ICD-10-CM

## 2018-03-19 DIAGNOSIS — I69351 Hemiplegia and hemiparesis following cerebral infarction affecting right dominant side: Secondary | ICD-10-CM

## 2018-03-19 DIAGNOSIS — I10 Essential (primary) hypertension: Secondary | ICD-10-CM | POA: Diagnosis present

## 2018-03-19 DIAGNOSIS — E785 Hyperlipidemia, unspecified: Secondary | ICD-10-CM | POA: Diagnosis present

## 2018-03-19 DIAGNOSIS — Z7982 Long term (current) use of aspirin: Secondary | ICD-10-CM

## 2018-03-19 HISTORY — PX: CRANIOPLASTY: SHX1407

## 2018-03-19 LAB — PROTIME-INR
INR: 1
PROTHROMBIN TIME: 13.1 s (ref 11.4–15.2)

## 2018-03-19 SURGERY — CRANIOPLASTY
Anesthesia: General | Site: Head

## 2018-03-19 MED ORDER — FENTANYL CITRATE (PF) 100 MCG/2ML IJ SOLN
INTRAMUSCULAR | Status: DC | PRN
  Administered 2018-03-19 (×2): 50 ug via INTRAVENOUS
  Administered 2018-03-19: 100 ug via INTRAVENOUS
  Administered 2018-03-19: 50 ug via INTRAVENOUS

## 2018-03-19 MED ORDER — LIDOCAINE 2% (20 MG/ML) 5 ML SYRINGE
INTRAMUSCULAR | Status: AC
  Filled 2018-03-19: qty 5

## 2018-03-19 MED ORDER — ACETAMINOPHEN 325 MG PO TABS: 650.0000 mg | ORAL_TABLET | ORAL | Status: DC | PRN

## 2018-03-19 MED ORDER — HYDROCODONE-ACETAMINOPHEN 5-325 MG PO TABS
1.0000 | ORAL_TABLET | ORAL | Status: DC | PRN
  Administered 2018-03-19: 1 via ORAL
  Filled 2018-03-19: qty 1

## 2018-03-19 MED ORDER — NALOXONE HCL 0.4 MG/ML IJ SOLN: 0.0800 mg | INTRAMUSCULAR | Status: DC | PRN

## 2018-03-19 MED ORDER — BACITRACIN ZINC 500 UNIT/GM EX OINT
TOPICAL_OINTMENT | CUTANEOUS | Status: DC | PRN
  Administered 2018-03-19: 1 via TOPICAL

## 2018-03-19 MED ORDER — ROCURONIUM BROMIDE 50 MG/5ML IV SOSY
PREFILLED_SYRINGE | INTRAVENOUS | Status: AC
  Filled 2018-03-19: qty 5

## 2018-03-19 MED ORDER — SUGAMMADEX SODIUM 200 MG/2ML IV SOLN
INTRAVENOUS | Status: DC | PRN
  Administered 2018-03-19: 400 mg via INTRAVENOUS

## 2018-03-19 MED ORDER — ROCURONIUM BROMIDE 10 MG/ML (PF) SYRINGE
PREFILLED_SYRINGE | INTRAVENOUS | Status: DC | PRN
  Administered 2018-03-19 (×2): 20 mg via INTRAVENOUS
  Administered 2018-03-19: 50 mg via INTRAVENOUS

## 2018-03-19 MED ORDER — ONDANSETRON HCL 4 MG PO TABS: 4.0000 mg | ORAL_TABLET | ORAL | Status: DC | PRN

## 2018-03-19 MED ORDER — THROMBIN 5000 UNITS EX SOLR
OROMUCOSAL | Status: DC | PRN
  Administered 2018-03-19: 16:00:00

## 2018-03-19 MED ORDER — BUPIVACAINE HCL (PF) 0.5 % IJ SOLN
INTRAMUSCULAR | Status: AC
  Filled 2018-03-19: qty 30

## 2018-03-19 MED ORDER — EPHEDRINE SULFATE-NACL 50-0.9 MG/10ML-% IV SOSY
PREFILLED_SYRINGE | INTRAVENOUS | Status: DC | PRN
  Administered 2018-03-19 (×3): 5 mg via INTRAVENOUS

## 2018-03-19 MED ORDER — DEXAMETHASONE SODIUM PHOSPHATE 10 MG/ML IJ SOLN
INTRAMUSCULAR | Status: DC | PRN
  Administered 2018-03-19: 10 mg via INTRAVENOUS

## 2018-03-19 MED ORDER — SODIUM CHLORIDE 0.9 % IV SOLN
INTRAVENOUS | Status: DC
  Administered 2018-03-19 (×3): via INTRAVENOUS

## 2018-03-19 MED ORDER — CHLORHEXIDINE GLUCONATE CLOTH 2 % EX PADS: 6.0000 | MEDICATED_PAD | Freq: Once | CUTANEOUS | Status: DC

## 2018-03-19 MED ORDER — PROPOFOL 10 MG/ML IV BOLUS
INTRAVENOUS | Status: DC | PRN
  Administered 2018-03-19: 30 mg via INTRAVENOUS
  Administered 2018-03-19: 140 mg via INTRAVENOUS

## 2018-03-19 MED ORDER — EPHEDRINE 5 MG/ML INJ
INTRAVENOUS | Status: AC
  Filled 2018-03-19: qty 10

## 2018-03-19 MED ORDER — THROMBIN 20000 UNITS EX KIT
PACK | CUTANEOUS | Status: AC
  Filled 2018-03-19: qty 1

## 2018-03-19 MED ORDER — PROPOFOL 10 MG/ML IV BOLUS
INTRAVENOUS | Status: AC
  Filled 2018-03-19: qty 20

## 2018-03-19 MED ORDER — FENTANYL CITRATE (PF) 100 MCG/2ML IJ SOLN: 25.0000 ug | INTRAMUSCULAR | Status: DC | PRN

## 2018-03-19 MED ORDER — SUCCINYLCHOLINE CHLORIDE 200 MG/10ML IV SOSY
PREFILLED_SYRINGE | INTRAVENOUS | Status: AC
  Filled 2018-03-19: qty 10

## 2018-03-19 MED ORDER — THROMBIN 5000 UNITS EX SOLR
CUTANEOUS | Status: AC
  Filled 2018-03-19: qty 5000

## 2018-03-19 MED ORDER — DEXAMETHASONE SODIUM PHOSPHATE 10 MG/ML IJ SOLN
INTRAMUSCULAR | Status: AC
  Filled 2018-03-19: qty 1

## 2018-03-19 MED ORDER — LIDOCAINE-EPINEPHRINE 1 %-1:100000 IJ SOLN: INTRAMUSCULAR | Status: DC | PRN

## 2018-03-19 MED ORDER — ONDANSETRON HCL 4 MG/2ML IJ SOLN: 4.0000 mg | INTRAMUSCULAR | Status: DC | PRN

## 2018-03-19 MED ORDER — BUPIVACAINE HCL (PF) 0.5 % IJ SOLN: INTRAMUSCULAR | Status: DC | PRN

## 2018-03-19 MED ORDER — SODIUM CHLORIDE 0.9 % IV SOLN
INTRAVENOUS | Status: DC | PRN
  Administered 2018-03-19: 500 mL

## 2018-03-19 MED ORDER — LIDOCAINE HCL (CARDIAC) PF 100 MG/5ML IV SOSY
PREFILLED_SYRINGE | INTRAVENOUS | Status: DC | PRN
  Administered 2018-03-19: 100 mg via INTRAVENOUS

## 2018-03-19 MED ORDER — SODIUM CHLORIDE 0.9 % IV SOLN: INTRAVENOUS | Status: DC

## 2018-03-19 MED ORDER — ACETAMINOPHEN 650 MG RE SUPP: 650.0000 mg | RECTAL | Status: DC | PRN

## 2018-03-19 MED ORDER — LABETALOL HCL 5 MG/ML IV SOLN: 10.0000 mg | INTRAVENOUS | Status: DC | PRN

## 2018-03-19 MED ORDER — SODIUM CHLORIDE 0.9 % IV SOLN
INTRAVENOUS | Status: DC | PRN
  Administered 2018-03-19: 25 ug/min via INTRAVENOUS

## 2018-03-19 MED ORDER — LEVETIRACETAM IN NACL 500 MG/100ML IV SOLN
500.0000 mg | Freq: Two times a day (BID) | INTRAVENOUS | Status: DC
  Administered 2018-03-20: 500 mg via INTRAVENOUS
  Filled 2018-03-19: qty 100

## 2018-03-19 MED ORDER — HYDROMORPHONE HCL 1 MG/ML IJ SOLN
0.5000 mg | INTRAMUSCULAR | Status: DC | PRN
  Administered 2018-03-19: 0.5 mg via INTRAVENOUS
  Administered 2018-03-20 (×2): 1 mg via INTRAVENOUS
  Filled 2018-03-19 (×3): qty 1

## 2018-03-19 MED ORDER — CEFAZOLIN SODIUM-DEXTROSE 2-4 GM/100ML-% IV SOLN
2.0000 g | INTRAVENOUS | Status: AC
  Administered 2018-03-19: 2 g via INTRAVENOUS
  Filled 2018-03-19: qty 100

## 2018-03-19 MED ORDER — FLEET ENEMA 7-19 GM/118ML RE ENEM: 1.0000 | ENEMA | Freq: Once | RECTAL | Status: DC | PRN

## 2018-03-19 MED ORDER — 0.9 % SODIUM CHLORIDE (POUR BTL) OPTIME
TOPICAL | Status: DC | PRN
  Administered 2018-03-19: 2000 mL

## 2018-03-19 MED ORDER — BISACODYL 5 MG PO TBEC: 5.0000 mg | DELAYED_RELEASE_TABLET | Freq: Every day | ORAL | Status: DC | PRN

## 2018-03-19 MED ORDER — SENNOSIDES-DOCUSATE SODIUM 8.6-50 MG PO TABS: 1.0000 | ORAL_TABLET | Freq: Every evening | ORAL | Status: DC | PRN

## 2018-03-19 MED ORDER — CEFAZOLIN SODIUM-DEXTROSE 1-4 GM/50ML-% IV SOLN
1.0000 g | Freq: Three times a day (TID) | INTRAVENOUS | Status: AC
  Administered 2018-03-20: 1 g via INTRAVENOUS
  Filled 2018-03-19 (×2): qty 50

## 2018-03-19 MED ORDER — LIDOCAINE-EPINEPHRINE 1 %-1:100000 IJ SOLN
INTRAMUSCULAR | Status: AC
  Filled 2018-03-19: qty 1

## 2018-03-19 MED ORDER — DOCUSATE SODIUM 100 MG PO CAPS
100.0000 mg | ORAL_CAPSULE | Freq: Two times a day (BID) | ORAL | Status: DC
  Administered 2018-03-20: 100 mg via ORAL
  Filled 2018-03-19: qty 1

## 2018-03-19 MED ORDER — HEMOSTATIC AGENTS (NO CHARGE) OPTIME
TOPICAL | Status: DC | PRN
  Administered 2018-03-19 (×2): 1

## 2018-03-19 MED ORDER — PROMETHAZINE HCL 25 MG PO TABS: 12.5000 mg | ORAL_TABLET | ORAL | Status: DC | PRN

## 2018-03-19 MED ORDER — MIDAZOLAM HCL 5 MG/5ML IJ SOLN
INTRAMUSCULAR | Status: DC | PRN
  Administered 2018-03-19: 2 mg via INTRAVENOUS

## 2018-03-19 MED ORDER — ONDANSETRON HCL 4 MG/2ML IJ SOLN
INTRAMUSCULAR | Status: AC
  Filled 2018-03-19: qty 2

## 2018-03-19 MED ORDER — BACITRACIN ZINC 500 UNIT/GM EX OINT
TOPICAL_OINTMENT | CUTANEOUS | Status: AC
  Filled 2018-03-19: qty 28.35

## 2018-03-19 MED ORDER — MIDAZOLAM HCL 2 MG/2ML IJ SOLN
INTRAMUSCULAR | Status: AC
  Filled 2018-03-19: qty 2

## 2018-03-19 MED ORDER — FENTANYL CITRATE (PF) 250 MCG/5ML IJ SOLN
INTRAMUSCULAR | Status: AC
  Filled 2018-03-19: qty 5

## 2018-03-19 SURGICAL SUPPLY — 77 items
BATTERY IQ STERILE (MISCELLANEOUS) ×3 IMPLANT
BENZOIN TINCTURE PRP APPL 2/3 (GAUZE/BANDAGES/DRESSINGS) IMPLANT
BLADE CLIPPER SURG (BLADE) ×3 IMPLANT
BNDG GAUZE ELAST 4 BULKY (GAUZE/BANDAGES/DRESSINGS) ×3 IMPLANT
BNDG STRETCH 4X75 NS LF (GAUZE/BANDAGES/DRESSINGS) ×3 IMPLANT
BUR ACORN 6.0 PRECISION (BURR) IMPLANT
BUR ACORN 6.0MM PRECISION (BURR)
BUR MATCHSTICK NEURO 3.0 LAGG (BURR) IMPLANT
BUR SPIRAL ROUTER 2.3 (BUR) IMPLANT
BUR SPIRAL ROUTER 2.3MM (BUR)
CANISTER SUCT 3000ML PPV (MISCELLANEOUS) ×3 IMPLANT
CARTRIDGE OIL MAESTRO DRILL (MISCELLANEOUS) IMPLANT
CLIP RANEY DISP (INSTRUMENTS) ×6 IMPLANT
CLIP VESOCCLUDE MED 6/CT (CLIP) IMPLANT
DIFFUSER DRILL AIR PNEUMATIC (MISCELLANEOUS) IMPLANT
DRAPE NEUROLOGICAL W/INCISE (DRAPES) ×3 IMPLANT
DRAPE SURG 17X23 STRL (DRAPES) IMPLANT
DRAPE WARM FLUID 44X44 (DRAPE) ×3 IMPLANT
DRSG OPSITE POSTOP 4X6 (GAUZE/BANDAGES/DRESSINGS) ×3 IMPLANT
DRSG TELFA 3X8 NADH (GAUZE/BANDAGES/DRESSINGS) ×3 IMPLANT
DURAMATRIX ONLAY 2X2 (Neuro Prosthesis/Implant) ×3 IMPLANT
DURAPREP 6ML APPLICATOR 50/CS (WOUND CARE) ×3 IMPLANT
ELECT REM PT RETURN 9FT ADLT (ELECTROSURGICAL) ×3
ELECTRODE REM PT RTRN 9FT ADLT (ELECTROSURGICAL) ×1 IMPLANT
GAUZE 4X4 16PLY RFD (DISPOSABLE) IMPLANT
GAUZE SPONGE 4X4 12PLY STRL (GAUZE/BANDAGES/DRESSINGS) ×3 IMPLANT
GAUZE SPONGE 4X4 16PLY XRAY LF (GAUZE/BANDAGES/DRESSINGS) ×9 IMPLANT
GLOVE BIO SURGEON STRL SZ7 (GLOVE) ×6 IMPLANT
GLOVE BIO SURGEON STRL SZ7.5 (GLOVE) ×3 IMPLANT
GLOVE BIOGEL PI IND STRL 7.0 (GLOVE) ×1 IMPLANT
GLOVE BIOGEL PI IND STRL 7.5 (GLOVE) ×3 IMPLANT
GLOVE BIOGEL PI INDICATOR 7.0 (GLOVE) ×2
GLOVE BIOGEL PI INDICATOR 7.5 (GLOVE) ×6
GLOVE ECLIPSE 7.0 STRL STRAW (GLOVE) ×6 IMPLANT
GOWN STRL REUS W/ TWL LRG LVL3 (GOWN DISPOSABLE) ×4 IMPLANT
GOWN STRL REUS W/ TWL XL LVL3 (GOWN DISPOSABLE) ×3 IMPLANT
GOWN STRL REUS W/TWL 2XL LVL3 (GOWN DISPOSABLE) IMPLANT
GOWN STRL REUS W/TWL LRG LVL3 (GOWN DISPOSABLE) ×8
GOWN STRL REUS W/TWL XL LVL3 (GOWN DISPOSABLE) ×6
HEMOSTAT POWDER KIT SURGIFOAM (HEMOSTASIS) ×3 IMPLANT
HEMOSTAT SURGICEL 2X14 (HEMOSTASIS) ×3 IMPLANT
HOOK DURA 1/2IN (MISCELLANEOUS) ×3 IMPLANT
KIT BASIN OR (CUSTOM PROCEDURE TRAY) ×3 IMPLANT
KIT TURNOVER KIT B (KITS) ×3 IMPLANT
NEEDLE HYPO 25X1 1.5 SAFETY (NEEDLE) ×3 IMPLANT
NS IRRIG 1000ML POUR BTL (IV SOLUTION) ×6 IMPLANT
OIL CARTRIDGE MAESTRO DRILL (MISCELLANEOUS)
PACK CRANIOTOMY CUSTOM (CUSTOM PROCEDURE TRAY) ×3 IMPLANT
PATTIES SURGICAL .5 X.5 (GAUZE/BANDAGES/DRESSINGS) IMPLANT
PATTIES SURGICAL .5 X3 (DISPOSABLE) IMPLANT
PATTIES SURGICAL 1X1 (DISPOSABLE) IMPLANT
PLATE 1.5  2HOLE LNG NEURO (Plate) ×10 IMPLANT
PLATE 1.5 2HOLE LNG NEURO (Plate) ×5 IMPLANT
SCREW SELF DRILL HT 1.5/4MM (Screw) ×30 IMPLANT
SPONGE LAP 4X18 RFD (DISPOSABLE) ×3 IMPLANT
SPONGE NEURO XRAY DETECT 1X3 (DISPOSABLE) IMPLANT
SPONGE SURGIFOAM ABS GEL 100 (HEMOSTASIS) ×3 IMPLANT
STAPLER VISISTAT 35W (STAPLE) ×6 IMPLANT
STOCKINETTE 6  STRL (DRAPES) ×2
STOCKINETTE 6 STRL (DRAPES) ×1 IMPLANT
SUT ETHILON 3 0 FSL (SUTURE) IMPLANT
SUT ETHILON 3 0 PS 1 (SUTURE) IMPLANT
SUT NURALON 4 0 TR CR/8 (SUTURE) ×3 IMPLANT
SUT STEEL 0 (SUTURE)
SUT STEEL 0 18XMFL TIE 17 (SUTURE) IMPLANT
SUT VIC AB 0 CT1 18XCR BRD8 (SUTURE) ×6 IMPLANT
SUT VIC AB 0 CT1 8-18 (SUTURE) ×12
SUT VIC AB 3-0 SH 8-18 (SUTURE) ×6 IMPLANT
TAPE SURG TRANSPORE 1 IN (GAUZE/BANDAGES/DRESSINGS) ×1 IMPLANT
TAPE SURGICAL TRANSPORE 1 IN (GAUZE/BANDAGES/DRESSINGS) ×2
TOWEL GREEN STERILE (TOWEL DISPOSABLE) ×3 IMPLANT
TOWEL GREEN STERILE FF (TOWEL DISPOSABLE) ×3 IMPLANT
TRAY FOLEY MTR SLVR 16FR STAT (SET/KITS/TRAYS/PACK) ×3 IMPLANT
TUBE CONNECTING 12'X1/4 (SUCTIONS) ×1
TUBE CONNECTING 12X1/4 (SUCTIONS) ×2 IMPLANT
UNDERPAD 30X30 (UNDERPADS AND DIAPERS) ×3 IMPLANT
WATER STERILE IRR 1000ML POUR (IV SOLUTION) ×3 IMPLANT

## 2018-03-19 NOTE — Anesthesia Procedure Notes (Signed)
Procedure Name: Intubation Date/Time: 03/19/2018 2:55 PM Performed by: Colin Benton, CRNA Pre-anesthesia Checklist: Patient identified, Emergency Drugs available, Suction available and Patient being monitored Patient Re-evaluated:Patient Re-evaluated prior to induction Oxygen Delivery Method: Circle system utilized Preoxygenation: Pre-oxygenation with 100% oxygen Induction Type: IV induction Ventilation: Mask ventilation without difficulty Laryngoscope Size: Mac and 4 Grade View: Grade I Tube type: Oral Tube size: 7.5 mm Number of attempts: 1 Airway Equipment and Method: Stylet Placement Confirmation: ETT inserted through vocal cords under direct vision,  positive ETCO2 and breath sounds checked- equal and bilateral Secured at: 23 cm Tube secured with: Tape Dental Injury: Teeth and Oropharynx as per pre-operative assessment

## 2018-03-19 NOTE — Anesthesia Preprocedure Evaluation (Addendum)
Anesthesia Evaluation  Patient identified by MRN, date of birth, ID band Patient awake    Reviewed: Allergy & Precautions, NPO status , Patient's Chart, lab work & pertinent test results  Airway Mallampati: II  TM Distance: >3 FB     Dental   Pulmonary shortness of breath, asthma , sleep apnea ,    breath sounds clear to auscultation       Cardiovascular hypertension,  Rhythm:Regular Rate:Normal     Neuro/Psych  Headaches,    GI/Hepatic negative GI ROS, Neg liver ROS,   Endo/Other  negative endocrine ROS  Renal/GU negative Renal ROS     Musculoskeletal   Abdominal   Peds  Hematology  (+) anemia ,   Anesthesia Other Findings   Reproductive/Obstetrics                             Anesthesia Physical Anesthesia Plan  ASA: III  Anesthesia Plan: General   Post-op Pain Management:    Induction: Intravenous  PONV Risk Score and Plan: Ondansetron, Dexamethasone and Midazolam  Airway Management Planned: Oral ETT  Additional Equipment:   Intra-op Plan:   Post-operative Plan: Possible Post-op intubation/ventilation  Informed Consent: I have reviewed the patients History and Physical, chart, labs and discussed the procedure including the risks, benefits and alternatives for the proposed anesthesia with the patient or authorized representative who has indicated his/her understanding and acceptance.   Dental advisory given  Plan Discussed with: CRNA and Anesthesiologist  Anesthesia Plan Comments:         Anesthesia Quick Evaluation

## 2018-03-19 NOTE — Op Note (Signed)
  NEUROSURGERY OPERATIVE NOTE   PREOP DIAGNOSIS:  1. Left cranial defect   POSTOP DIAGNOSIS: Same  PROCEDURE: 1. Left cranioplasty 2. Removal of bone flap from abdominal subcutaneous pocket  SURGEON: Dr. Lisbeth Renshaw, MD  ASSISTANT: Dr. Autumn Patty, MD  ANESTHESIA: General Endotracheal  EBL: 400cc  SPECIMENS: None  DRAINS: None  COMPLICATIONS: None immediate  CONDITION: Hemodynamically stable to PACU  HISTORY: Jeremy [name] is a 42 y.o. male who suffered a left hemispheric stroke back in January of this year.  He developed malignant cerebral edema requiring left craniectomy.  He has since made a reasonable recovery and presents today for cranioplasty.  The risks and benefits of the surgery were explained in detail to the patient and his wife.  After all questions were answered informed consent was obtained and witnessed.  PROCEDURE IN DETAIL: The patient was brought to the operating room. After induction of general anesthesia, the patient was positioned on the operative table in the supine position with a left sided shoulder roll. All pressure points were meticulously padded.  The previous left cranial and right abdominal incisions were then marked, prepped and draped in the usual sterile fashion.  After timeout was conducted, the scalp incision was opened sharply and Bovie electrocautery was used to dissected subcutaneous tissue.  The galea was incised and the edges of the craniectomy were identified.  Using a combination of Bovie electrocautery and Metzenbaum scissors, the subgaleal plane was identified and developed to elevate the skin flap away from the dura.  The temporalis muscle was identified and also dissected away from the underlying dura.  The underlying brain was noted to be slightly herniated above the craniectomy defect.  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  A small opening was made in the dura in the  subtemporal region which did allow some slight egress of CSF.  This did then relax the underlying brain.  At this point the abdominal incision was opened sharply and Bovie was used to dissected subcutaneous tissue.  The bone flap was then identified and was found to be somewhat adherent.  It was dissected away from the margins of the subcutaneous pocket and removed.  The bone flap was then plated with several standard dog bone type titanium plates.  It was then replaced in the cranial defect and secured with screws.  Prior to placement of the bone flap, meticulous hemostasis was achieved using a combination of bipolar electrocautery and morselized Gelfoam with thrombin.  At this point hemostasis was then secured on the free edges of the temporalis muscle.  We also obtain good hemostasis in the abdominal pocket with bipolar.  The abdominal cavity was then closed with interrupted 0 Vicryl stitches and skin was closed with staples.  The cranial wound was also closed in standard fashion with interrupted 0 Vicryl stitches and the galea and temporalis fascia.  Skin was closed with staples.  Bacitracin ointment and sterile dressings were then applied.  At the end of the case all sponge, needle, instrument, and cottonoid counts are correct.  The patient was then transferred to the stretcher, extubated, and taken to the postanesthesia care unit in stable hemodynamic condition.

## 2018-03-19 NOTE — Transfer of Care (Signed)
Immediate Anesthesia Transfer of Care Note  Patient: Jeremy [name]  Procedure(s) Performed: CRANIOPLASTY (N/A Head)  Patient Location: PACU  Anesthesia Type:General  Level of Consciousness: drowsy and responds to stimulation  Airway & Oxygen Therapy: Patient Spontanous Breathing and Patient connected to nasal cannula oxygen  Post-op Assessment: Report given to RN and Post -op Vital signs reviewed and stable  Post vital signs: Reviewed and stable  Last Vitals:  Vitals Value Taken Time  BP 124/78 03/19/2018  5:35 PM  Temp    Pulse 94 03/19/2018  5:42 PM  Resp 13 03/19/2018  5:42 PM  SpO2 100 % 03/19/2018  5:42 PM  Vitals shown include unvalidated device data.  Last Pain:  Vitals:   03/19/18 1204  TempSrc:   PainSc: 3       Patients Stated Pain Goal: 3 (03/19/18 1204)  Complications: No apparent anesthesia complications

## 2018-03-19 NOTE — Consult Note (Addendum)
Jeremy [name]  YQM:[id number] DOB: Dec 02, 1975 DOA: 03/19/2018 PCP: [name], MD    LOS: 0 days   Reason for Consult / Chief Complaint:  Vent Management Post op  Consulting MD and date of consult:  Nundkumar 03/19/18  HPI/Summary of hospital stay    Jeremy [name] is a 42 y.o. 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  He has done well since then after extended rehab with the exception of developing LLE DVT with PE in March.  He was placed on Eliquis for anticoagulation; however, has been off of it for the past 3 months.  He presented to [facility] 03/19/18 for elective cranioplasty.  He underwent the procedure and post op, he was admitted to the neuro ICU by neurosurgery and PCCM was consulted in case pt were to remain ventilated post operatively.  Upon my evaluation at bedside once pt arrived to ICU, he has been extubated successfully.  He has no complaints besides mild headache.  No dyspnea.  Vitals stable.  Of note, pt is a TEFL teacher Witness and confirmed with Dr. [name] that he would not consent to blood product transfusion regardless of whether it was necessary for life saving measures.   Subjective  Awake, follows commands.  No dyspnea, chest pain.   Objective   BP (!) 134/91   Pulse 74   Temp 98.4 F (36.9 C) (Oral)   Resp 18   Ht 5\' 11"  (1.803 m)   Wt 87.6 kg   SpO2 99%   BMI 26.95 kg/m   Intake/Output Summary (Last 24 hours) at 03/19/2018 1731 Last data filed at 03/19/2018 1630 Gross per 24 hour  Intake 1000 ml  Output 645 ml  Net 355 ml         Filed Weights   03/19/18 1152  Weight: 87.6 kg     Examination: General: Young male, resting in bed, in NAD. Neuro: A&O x 3, no deficits. HEENT: [place]/AT. Sclerae anicteric, EOMI. Cardiovascular: RRR, no M/R/G.  Lungs: Respirations even and unlabored.  CTA bilaterally, No  W/R/R. Abdomen: BS x 4, soft, NT/ND.  Musculoskeletal: No gross deformities, no edema.  Skin: Intact, warm, no rashes.   Labs   CBC: Recent Labs  Lab 03/13/18 1001  WBC 6.6  HGB 16.1  HCT 49.0  MCV 92.8  PLT 264   Basic Metabolic Panel: Recent Labs  Lab 03/13/18 1001  NA 137  K 4.3  CL 100  CO2 24  GLUCOSE 111*  BUN 21*  CREATININE 1.21  CALCIUM 10.0   GFR: Estimated Creatinine Clearance: 87.3 mL/min (by C-G formula based on SCr of 1.21 mg/dL). Recent Labs  Lab 03/13/18 1001  WBC 6.6   Liver Function Tests: No results for input(s): AST, ALT, ALKPHOS, BILITOT, PROT, ALBUMIN in the last 168 hours. No results for input(s): LIPASE, AMYLASE in the last 168 hours. No results for input(s): AMMONIA in the last 168 hours. ABG    Component Value Date/Time   PHART 7.455 (H) 08/02/2017 0405   PCO2ART 40.5 08/02/2017 0405   PO2ART 141 (H) 08/02/2017 0405   HCO3 28.0 08/02/2017 0405   TCO2 25 07/28/2017 0137   ACIDBASEDEF 2.0 07/28/2017 0137   O2SAT 99.1 08/02/2017 0405    Coagulation Profile: Recent Labs  Lab 03/19/18 1146  INR 1.00   Cardiac Enzymes: No results for input(s):  CKTOTAL, CKMB, CKMBINDEX, TROPONINI in the last 168 hours. HbA1C: Hgb A1c MFr Bld  Date/Time Value Ref Range Status  07/28/2017 02:59 AM 5.2 4.8 - 5.6 % Final    Comment:    (NOTE) Pre diabetes:          5.7%-6.4% Diabetes:              >6.4% Glycemic control for   <7.0% adults with diabetes    CBG: No results for input(s): GLUCAP in the last 168 hours.  Review of Systems:   Unable to obtain as pt is encephalopathic.  Past medical history  He,  has a past medical history of Asthma, Deviated septum, Dyspnea, Hypertension, Sleep apnea, and Stroke (HCC).   Surgical History    Past Surgical History:  Procedure Laterality Date  . CRANIOTOMY Left 07/29/2017   Procedure: LEFT HEMI- CRANIECTOMY WITH PLACEMENT OF BONE FLAP IN ABDOMEN;  Surgeon: Lisbeth Renshaw, MD;  Location: [facility]  OR;  Service: Neurosurgery;  Laterality: Left;  . IR ANGIO INTRA EXTRACRAN SEL COM CAROTID INNOMINATE UNI R MOD SED  07/28/2017  . IR ANGIO VERTEBRAL SEL VERTEBRAL UNI R MOD SED  07/28/2017  . IR CT HEAD LTD  07/28/2017  . IR PERCUTANEOUS ART THROMBECTOMY/INFUSION INTRACRANIAL INC DIAG ANGIO  07/28/2017  . NASAL SEPTOPLASTY W/ TURBINOPLASTY Bilateral 03/06/2017   Procedure: NASAL SEPTOPLASTY WITH TURBINATE REDUCTION;  Surgeon: Linus Salmons, MD;  Location: [facility] ORS;  Service: ENT;  Laterality: Bilateral;  . RADIOLOGY WITH ANESTHESIA N/A 07/27/2017   Procedure: RADIOLOGY WITH ANESTHESIA;  Surgeon: Julieanne Cotton, MD;  Location: [facility] OR;  Service: Radiology;  Laterality: N/A;     Social History   Social History   Socioeconomic History  . Marital status: Married    Spouse name: Not on file  . Number of children: Not on file  . Years of education: Not on file  . Highest education level: Not on file  Occupational History  . Not on file  Social Needs  . Financial resource strain: Not on file  . Food insecurity:    Worry: Not on file    Inability: Not on file  . Transportation needs:    Medical: Not on file    Non-medical: Not on file  Tobacco Use  . Smoking status: Never Smoker  . Smokeless tobacco: Never Used  Substance and Sexual Activity  . Alcohol use: Not Currently    Comment: RARE  . Drug use: No  . Sexual activity: Not on file  Lifestyle  . Physical activity:    Days per week: Not on file    Minutes per session: Not on file  . Stress: Not on file  Relationships  . Social connections:    Talks on phone: Not on file    Gets together: Not on file    Attends religious service: Not on file    Active member of club or organization: Not on file    Attends meetings of clubs or organizations: Not on file    Relationship status: Not on file  . Intimate partner violence:    Fear of current or ex partner: Not on file    Emotionally abused: Not on file    Physically abused:  Not on file    Forced sexual activity: Not on file  Other Topics Concern  . Not on file  Social History Narrative  . Not on file  ,  reports that he has never smoked. He has never used smokeless tobacco. He  reports that he drank alcohol. He reports that he does not use drugs.   Family history   His family history includes Hypertension in his brother and father.   Allergies Allergies  Allergen Reactions  . Blood-Group Specific Substance Other (See Comments)    PT JEHOVAHS WITNESS DOES NOT ACCEPT BLOOD PRODUCTS  . Lipitor [Atorvastatin Calcium] Other (See Comments)    myalgia    Home meds  Prior to Admission medications   Medication Sig Start Date End Date Taking? Authorizing Provider  amLODipine (NORVASC) 5 MG tablet Take 5 mg by mouth daily.    Yes [provider]  aspirin EC 81 MG tablet Take 1 tablet (81 mg total) by mouth daily. 10/08/17  Yes Maxie Barb, MD  ELIQUIS STARTER PACK (ELIQUIS STARTER PACK) 5 MG TABS Take as directed on package: start with two-5mg  tablets twice daily for 7 days. On day 8, switch to one-5mg  tablet twice daily. Patient taking differently: Take 5 mg by mouth 2 (two) times daily.  10/08/17  Yes Maxie Barb, MD  escitalopram (LEXAPRO) 10 MG tablet Take 1 tablet (10 mg total) by mouth daily. 09/07/17  Yes Angiulli, Mcarthur Rossetti, PA-C  ezetimibe (ZETIA) 10 MG tablet Take by mouth. 01/31/18  Yes [provider]  gabapentin (NEURONTIN) 400 MG capsule TAKE ONE CAPSULE BY MOUTH THREE TIMES A DAY 03/19/18  Yes Kirsteins, Victorino Sparrow, MD  loratadine (CLARITIN) 10 MG tablet Take 10 mg by mouth daily.   Yes [provider]  rosuvastatin (CRESTOR) 10 MG tablet Take 10 mg by mouth daily.   Yes [provider]  spironolactone (ALDACTONE) 50 MG tablet Take 100 mg by mouth daily.    Yes [provider]     Consults: (date of consult, sign off date and final recs)  PCCM 03/19/18  Procedures: 9/3 > OR for removal of  bone flap from abdominal subcutaneous pocket and left cranioplasty, extubated in PACU.  Significant Diagnostic Tests: None.  Micro Data: None.  Antimicrobials:  None.    Assessment & Plan:   S/p left cranioplasty - had left MCA CVA in January 2019 that was complicated by [facility] after tPA and unsuccessful IR attempt as well as malignant edema for which he required left decompressive craniectomy. Plan: Post op care per neurosurgery. Continue empiric keppra per neurosurgery. Defer timing of resuming preadmission antiplatelets to neurosurgery.  Hx LLE DVT with PE (March 2019) - was on Eliquis; however, this has been held for 3 months to allow for cranioplasty. Plan: Defer timing of resuming anticoagulation to neurosurgery.  Hx HTN. Plan: Labetalol PRN for goal SBP 120 - 140 per neurosurgery.   Best Practice / Goals of Care / Disposition.   DVT prophylaxis: SCD's. GI prophylaxis: N/A. Diet: Regular diet per NSGY. Mobility: Bed Rest for now. Code Status: Full. Family Communication: None available.   Disposition / Summary of Today's Plan 03/19/18   ICU. Pt was successfully extubated in PACU and is currently stable in ICU; therefore, PCCM will sign off.  Please do not hesitate to call us back if we can be of further assistance.   Rutherford Guys, [place] [facility] Pager: [phone]  or [phone] 03/19/2018, 6:53 PM

## 2018-03-20 ENCOUNTER — Encounter (HOSPITAL_COMMUNITY): Payer: Self-pay | Admitting: Neurosurgery

## 2018-03-20 ENCOUNTER — Telehealth: Payer: Self-pay | Admitting: Adult Health

## 2018-03-20 ENCOUNTER — Other Ambulatory Visit: Payer: Self-pay

## 2018-03-20 LAB — MRSA PCR SCREENING: MRSA BY PCR: NEGATIVE

## 2018-03-20 LAB — BASIC METABOLIC PANEL
Anion gap: 11 (ref 5–15)
BUN: 13 mg/dL (ref 6–20)
CO2: 24 mmol/L (ref 22–32)
Calcium: 9.1 mg/dL (ref 8.9–10.3)
Chloride: 101 mmol/L (ref 98–111)
Creatinine, Ser: 1.04 mg/dL (ref 0.61–1.24)
GFR calc Af Amer: >60 mL/min (ref >60)
GFR calc non Af Amer: >60 mL/min (ref >60)
Glucose, Bld: 157 mg/dL — ABNORMAL HIGH (ref 70–99)
Potassium: 4.4 mmol/L (ref 3.5–5.1)
Sodium: 136 mmol/L (ref 135–145)

## 2018-03-20 LAB — CBC
HCT: 38 % — ABNORMAL LOW (ref 39.0–52.0)
Hemoglobin: 12.9 g/dL — ABNORMAL LOW (ref 13.0–17.0)
MCH: 30.8 pg (ref 26.0–34.0)
MCHC: 33.9 g/dL (ref 30.0–36.0)
MCV: 90.7 fL (ref 78.0–100.0)
Platelets: 254 10*3/uL (ref 150–400)
RBC: 4.19 MIL/uL — ABNORMAL LOW (ref 4.22–5.81)
RDW: 11.9 % (ref 11.5–15.5)
WBC: 14 10*3/uL — ABNORMAL HIGH (ref 4.0–10.5)

## 2018-03-20 MED ORDER — LEVETIRACETAM 500 MG PO TABS: 500.0000 mg | ORAL_TABLET | Freq: Two times a day (BID) | ORAL | 0 refills | Status: DC

## 2018-03-20 MED ORDER — HYDROCODONE-ACETAMINOPHEN 5-325 MG PO TABS: 1.0000 | ORAL_TABLET | ORAL | 0 refills | Status: DC | PRN

## 2018-03-20 MED ORDER — ASPIRIN EC 81 MG PO TBEC: 81.0000 mg | DELAYED_RELEASE_TABLET | Freq: Every day | ORAL | 0 refills | Status: DC

## 2018-03-20 NOTE — Telephone Encounter (Signed)
Left vm for patients wife that pt is being discharge from the hospital today. Rn recommend she speak with the hospital Md about the eliquis if pt can resume after surgery. Pt is on it for DVT. Rn left vm to seek the discharge MD about proper instructions. Rn also left vm that the eliquis prescribed by pts PCP. PT sees neurosurgery and our office for follow up.

## 2018-03-20 NOTE — Discharge Summary (Signed)
Physician Discharge Summary  Patient ID: Shaydon Lease MRN: [id number] DOB/AGE: 16-Feb-1976 42 y.o.  Admit date: 03/19/2018 Discharge date: 03/20/2018  Admission Diagnoses:  Skull defect secondary to CVA  Discharge Diagnoses:  Same Active Problems:   History of cranioplasty   Discharged Condition: Stable  Hospital Course:  Enoch Moffa is a 42 y.o. male who was admitted for the below procedure. There were no post operative complications. At time of discharge, pain was well controlled, ambulating with Pt/OT, tolerating po, voiding normal. Ready for discharge. Will keep on Keppra for 7 days. F/U 2 weeks forr staple removal.  Treatments: Surgery - cranioplasty  Discharge Exam: Blood pressure 109/71, pulse (!) 112, temperature 98.7 F (37.1 C), temperature source Oral, resp. rate 14, height 5\' 11"  (1.803 m), weight 87.6 kg, SpO2 92 %. Awake, alert, oriented Speech fluent, appropriate CN grossly intact Stable motor exam Wound c/d/i  Disposition: Discharge disposition: 01-Home or Self Care       Discharge Instructions    Call MD for:  difficulty breathing, headache or visual disturbances   Complete by:  As directed    Call MD for:  persistant dizziness or light-headedness   Complete by:  As directed    Call MD for:  redness, tenderness, or signs of infection (pain, swelling, redness, odor or green/yellow discharge around incision site)   Complete by:  As directed    Call MD for:  severe uncontrolled pain   Complete by:  As directed    Call MD for:  temperature >100.4   Complete by:  As directed    Diet general   Complete by:  As directed    Driving Restrictions   Complete by:  As directed    Do not drive until given clearance.   Increase activity slowly   Complete by:  As directed    Lifting restrictions   Complete by:  As directed    Do not lift anything >10lbs. Avoid bending and twisting in awkward positions. Avoid bending at the back.   May shower / Bathe   Complete by:   As directed    In 24 hours. Okay to wash wound with warm soapy water. Avoid scrubbing the wound. Pat dry.   Remove dressing in 24 hours   Complete by:  As directed      Allergies as of 03/20/2018      Reactions   Blood-group Specific Substance Other (See Comments)   PT JEHOVAHS WITNESS DOES NOT ACCEPT BLOOD PRODUCTS   Lipitor [atorvastatin Calcium] Other (See Comments)   myalgia      Medication List    STOP taking these medications   ELIQUIS STARTER PACK 5 MG Tabs     TAKE these medications   amLODipine 5 MG tablet Commonly known as:  NORVASC Take 5 mg by mouth daily.   aspirin EC 81 MG tablet Take 1 tablet (81 mg total) by mouth daily. Start taking on:  03/26/2018 What changed:  These instructions start on 03/26/2018. If you are unsure what to do until then, ask your doctor or other care provider.   escitalopram 10 MG tablet Commonly known as:  LEXAPRO Take 1 tablet (10 mg total) by mouth daily.   ezetimibe 10 MG tablet Commonly known as:  ZETIA Take by mouth.   gabapentin 400 MG capsule Commonly known as:  NEURONTIN TAKE ONE CAPSULE BY MOUTH THREE TIMES A DAY   HYDROcodone-acetaminophen 5-325 MG tablet Commonly known as:  NORCO/VICODIN Take 1 tablet by mouth every  4 (four) hours as needed for moderate pain.   levETIRAcetam 500 MG tablet Commonly known as:  KEPPRA Take 1 tablet (500 mg total) by mouth 2 (two) times daily.   loratadine 10 MG tablet Commonly known as:  CLARITIN Take 10 mg by mouth daily.   rosuvastatin 10 MG tablet Commonly known as:  CRESTOR Take 10 mg by mouth daily.   spironolactone 50 MG tablet Commonly known as:  ALDACTONE Take 100 mg by mouth daily.        SignedAlyson Ingles 03/20/2018, 8:25 AM

## 2018-03-20 NOTE — Telephone Encounter (Signed)
Pts wife [name] returning RNs call, stating they are aware the eliquis is prescribed by PCP, but pts PCP will not give advise stating it is up to NP Shanda Bumps since she is the once who signed the clearance.

## 2018-03-20 NOTE — Telephone Encounter (Signed)
Rn call the wife back about the eliquis. The wife pt is being discharge today, but still in the hospital. The wife stated on the discharge paper work it states to stop the eliquis. She stated the discharge Rn explain to her it was not on his list of medications. Rn ask pt if she spoke with the discharge MD. Rn stated any medication concerns or discrepancies should be discuss with the hospital MD or surgeon.PT had surgery on yesterday,and needed a clearance from the neurologist. The surgery was done by the neurosurgery Dr. [name]. The wife verbalized understanding to have the charge nurse page the discharge MD or Dr. [name] MD.

## 2018-03-20 NOTE — Progress Notes (Signed)
Pt and pt's wife were given discharge summary.  All questions answered and patient transported down to car.  Heloise Purpura RN

## 2018-03-20 NOTE — Progress Notes (Signed)
  NEUROSURGERY PROGRESS NOTE   Pt seen and examined. No issues overnight. Minimal HA.  EXAM: Temp:  [97 F (36.1 C)-98.9 F (37.2 C)] 98.7 F (37.1 C) (09/04 0400) Pulse Rate:  [74-117] 112 (09/04 0700) Resp:  [10-20] 14 (09/04 0700) BP: (97-134)/(68-91) 109/71 (09/04 0700) SpO2:  [91 %-100 %] 92 % (09/04 0700) Weight:  [87.6 kg] 87.6 kg (09/03 1152) Intake/Output      09/03 0701 - 09/04 0700 09/04 0701 - 09/05 0700   I.V. (mL/kg) 1359.1 (15.5)    Other 750    IV Piggyback 68.7    Total Intake(mL/kg) 2177.8 (24.9)    Urine (mL/kg/hr) 300    Blood 400    Total Output 700    Net +1477.8          Awake, alert, oriented Speech fluent Stable right hemiparesis Wound dressing c/d/i  LABS: Lab Results  Component Value Date   CREATININE 1.04 03/20/2018   BUN 13 03/20/2018   NA 136 03/20/2018   K 4.4 03/20/2018   CL 101 03/20/2018   CO2 24 03/20/2018   Lab Results  Component Value Date   WBC 14.0 (H) 03/20/2018   HGB 12.9 (L) 03/20/2018   HCT 38.0 (L) 03/20/2018   MCV 90.7 03/20/2018   PLT 254 03/20/2018   IMPRESSION: - 42 y.o. male POD#1 s/p left cranioplasty, at baseline.  PLAN: - d/c home today - finish 7d postop Keppra

## 2018-03-20 NOTE — Telephone Encounter (Signed)
Rn call wife about her concerns over the eliquis not being started. Pt wife stated the Rn at the hospital page Dr.Nundkumar and he advised the wife on the meds. The wife stated Dr.Nundkumar wants pt to restart aspirin, and eliquis on 03/26/2018.The pt has follow up appts with Dr.Nundkumar in a couple of weeks.

## 2018-03-20 NOTE — Telephone Encounter (Signed)
RN call pharmacy to find out who is prescribing the eliquis. The pharmacy tech stated Dr. [name] is prescribing the eliquis for patient.

## 2018-03-20 NOTE — Telephone Encounter (Signed)
Pts wife Melanie(on DPR) requesting a call to discuss the proper way of going back on aspirin EC 81 MG tablet and ELIQUIS STARTER PACK (ELIQUIS STARTER PACK) 5 MG TABS after pts surgery.

## 2018-03-21 NOTE — Anesthesia Postprocedure Evaluation (Signed)
Anesthesia Post Note  Patient: Wenceslaus Delbene  Procedure(s) Performed: CRANIOPLASTY (N/A Head)     Patient location during evaluation: PACU Anesthesia Type: General Level of consciousness: awake and alert Pain management: pain level controlled Vital Signs Assessment: post-procedure vital signs reviewed and stable Respiratory status: spontaneous breathing, nonlabored ventilation, respiratory function stable and patient connected to nasal cannula oxygen Cardiovascular status: blood pressure returned to baseline and stable Postop Assessment: no apparent nausea or vomiting Anesthetic complications: no    Last Vitals:  Vitals:   03/20/18 1100 03/20/18 1200  BP: 104/67   Pulse: 94 (!) 101  Resp: 14 12  Temp:    SpO2: 94% 95%    Last Pain:  Vitals:   03/20/18 0948  TempSrc:   PainSc: Asleep                 Jax Kentner S

## 2018-03-29 ENCOUNTER — Telehealth: Payer: Self-pay | Admitting: Physical Medicine & Rehabilitation

## 2018-03-29 DIAGNOSIS — I69351 Hemiplegia and hemiparesis following cerebral infarction affecting right dominant side: Secondary | ICD-10-CM

## 2018-03-29 NOTE — Telephone Encounter (Signed)
pts wife called regarding PT for Jeremy [name] DOB: May 22, 1976. He needs a new PT order put in Epic [facility] bc the [facility] which was never used...  The pt now has insurance & would like to resume therapy.  Thanks, Rosezella FloridaLisa M.

## 2018-04-04 ENCOUNTER — Ambulatory Visit: Payer: BLUE CROSS/BLUE SHIELD | Admitting: Physical Medicine & Rehabilitation

## 2018-04-05 ENCOUNTER — Encounter: Payer: BLUE CROSS/BLUE SHIELD | Attending: Registered Nurse

## 2018-04-05 ENCOUNTER — Encounter: Payer: Self-pay | Admitting: Physical Medicine & Rehabilitation

## 2018-04-05 ENCOUNTER — Ambulatory Visit: Payer: BLUE CROSS/BLUE SHIELD | Admitting: Physical Medicine & Rehabilitation

## 2018-04-05 VITALS — BP 120/85 | HR 72 | Ht 71.0 in | Wt 194.0 lb

## 2018-04-05 DIAGNOSIS — I69314 Frontal lobe and executive function deficit following cerebral infarction: Secondary | ICD-10-CM | POA: Diagnosis not present

## 2018-04-05 DIAGNOSIS — I10 Essential (primary) hypertension: Secondary | ICD-10-CM | POA: Diagnosis not present

## 2018-04-05 DIAGNOSIS — G4730 Sleep apnea, unspecified: Secondary | ICD-10-CM | POA: Diagnosis not present

## 2018-04-05 DIAGNOSIS — I639 Cerebral infarction, unspecified: Secondary | ICD-10-CM | POA: Diagnosis not present

## 2018-04-05 DIAGNOSIS — I69351 Hemiplegia and hemiparesis following cerebral infarction affecting right dominant side: Secondary | ICD-10-CM | POA: Diagnosis not present

## 2018-04-05 DIAGNOSIS — I69319 Unspecified symptoms and signs involving cognitive functions following cerebral infarction: Secondary | ICD-10-CM | POA: Insufficient documentation

## 2018-04-05 DIAGNOSIS — Z8249 Family history of ischemic heart disease and other diseases of the circulatory system: Secondary | ICD-10-CM | POA: Insufficient documentation

## 2018-04-05 DIAGNOSIS — G8114 Spastic hemiplegia affecting left nondominant side: Secondary | ICD-10-CM | POA: Diagnosis not present

## 2018-04-05 NOTE — Patient Instructions (Addendum)
Restarted outpt PT and OT  Have PT try heel lift under the Right AFO  Will repeat Botox in 6 wk ultrasound guided  New double metal upright AFO  Try ICY HOT or Crista ElliotBen Gay

## 2018-04-05 NOTE — Progress Notes (Signed)
Botox Injection for spasticity using needle EMG guidance  Dilution: 50 Units/ml Indication: Severe spasticity which interferes with ADL,mobility and/or  hygiene and is unresponsive to medication management and other conservative care Informed consent was obtained after describing risks and benefits of the procedure with the patient. This includes bleeding, bruising, infection, excessive weakness, or medication side effects. A REMS form is on file and signed. Needle: 25g 2" needle electrode Number of units per muscle   FPL 25 U FDS 25 U  Extra 50U in gastroc Right posterior tibialis 75 units Right medial gastroc 50 units Right lateral gastroc 50 units Right medial soleus 75 units Right lateral soleus 50 units All injections were done after obtaining appropriate EMG activity and after negative drawback for blood. The patient tolerated the procedure well. Post procedure instructions were given. A followup appointment was made.

## 2018-04-05 NOTE — Progress Notes (Signed)
Subjective:    Patient ID: Jeremy [name], male    DOB: Jan 10, 1976, 42 y.o.   MRN: [id number] 42 year old right-handed male with history of hypertension, on no antihypertensive medications, who presented and July 27, 2017, with right-sided weakness, headache, and slurred speech.  He lives with spouse and 40-year-old son, independent prior to admission.  Cranial CT scan showed left insula and frontal operculum lucency, compatible with infarction.  Echocardiogram with ejection fraction of 65%, no wall motion abnormalities, no PFO.  CT angiography of the head and neck showed a left M1 occlusion, intermediate left MCA distribution collateralization.  Underwent attempted mechanical thrombectomy per Interventional Radiology that was unsuccessful, required intubation. Followup MRI showed cerebellar edema, midline shift; placed on 3% saline.  Underwent left decompressive hemicraniectomy, placement of bone flap in abdominal pocket on July 29, 2017, per Dr. Conchita ParisNundkumar.  The patient extubated on June 02, 2018.  Diet slowly advanced.  Followup cranial CT scan on August 03, 2017, no acute changes.  Bilateral lower extremity Dopplers negative.  TEE was attempted on August 06, 2017, but one was unable to performed DATE OF ADMISSION:  08/07/2017 DATE OF DISCHARGE:  09/07/2017  HPI Pt ambulating more after botox some Left sided back spasm No falls or trauma Using hot water bottle for pain.  We discussed muscle relaxers as well as the side effect of sedation with most of them, he would like to avoid this if possible.  He is restarting some physical therapy now that he has had a change in his insurance.  Postop day #17 status post cranioplasty Pain Inventory Average Pain 3 Pain Right Now 4 My pain is intermittent, dull and aching  In the last 24 hours, has pain interfered with the following? General activity 6 Relation with others 4 Enjoyment of life 6 What TIME of day is your pain at its  worst? night Sleep (in general) Fair  Pain is worse with: walking, bending and some activites Pain improves with: rest, heat/ice and medication Relief from Meds: 2  Mobility walk with assistance use a cane ability to climb steps?  yes do you drive?  no needs help with transfers transfers alone  Function disabled: date disabled 2019 I need assistance with the following:  dressing, bathing, meal prep, household duties and shopping  Neuro/Psych trouble walking spasms dizziness  Prior Studies Any changes since last visit?  no  Physicians involved in your care Any changes since last visit?  no   Family History  Problem Relation Age of Onset  . Hypertension Father   . Hypertension Brother    Social History   Socioeconomic History  . Marital status: Married    Spouse name: Not on file  . Number of children: Not on file  . Years of education: Not on file  . Highest education level: Not on file  Occupational History  . Not on file  Social Needs  . Financial resource strain: Not on file  . Food insecurity:    Worry: Not on file    Inability: Not on file  . Transportation needs:    Medical: Not on file    Non-medical: Not on file  Tobacco Use  . Smoking status: Never Smoker  . Smokeless tobacco: Never Used  Substance and Sexual Activity  . Alcohol use: Not Currently    Comment: RARE  . Drug use: No  . Sexual activity: Not on file  Lifestyle  . Physical activity:    Days per week: Not on file  Minutes per session: Not on file  . Stress: Not on file  Relationships  . Social connections:    Talks on phone: Not on file    Gets together: Not on file    Attends religious service: Not on file    Active member of club or organization: Not on file    Attends meetings of clubs or organizations: Not on file    Relationship status: Not on file  Other Topics Concern  . Not on file  Social History Narrative  . Not on file   Past Surgical History:  Procedure  Laterality Date  . CRANIOPLASTY N/A 03/19/2018   Procedure: CRANIOPLASTY;  Surgeon: Lisbeth Renshaw, MD;  Location: [facility] OR;  Service: Neurosurgery;  Laterality: N/A;  CRANIOPLASTY  . CRANIOTOMY Left 07/29/2017   Procedure: LEFT HEMI- CRANIECTOMY WITH PLACEMENT OF BONE FLAP IN ABDOMEN;  Surgeon: Lisbeth Renshaw, MD;  Location: [facility] OR;  Service: Neurosurgery;  Laterality: Left;  . IR ANGIO INTRA EXTRACRAN SEL COM CAROTID INNOMINATE UNI R MOD SED  07/28/2017  . IR ANGIO VERTEBRAL SEL VERTEBRAL UNI R MOD SED  07/28/2017  . IR CT HEAD LTD  07/28/2017  . IR PERCUTANEOUS ART THROMBECTOMY/INFUSION INTRACRANIAL INC DIAG ANGIO  07/28/2017  . NASAL SEPTOPLASTY W/ TURBINOPLASTY Bilateral 03/06/2017   Procedure: NASAL SEPTOPLASTY WITH TURBINATE REDUCTION;  Surgeon: Linus Salmons, MD;  Location: [facility] ORS;  Service: ENT;  Laterality: Bilateral;  . RADIOLOGY WITH ANESTHESIA N/A 07/27/2017   Procedure: RADIOLOGY WITH ANESTHESIA;  Surgeon: Julieanne Cotton, MD;  Location: [facility] OR;  Service: Radiology;  Laterality: N/A;   Past Medical History:  Diagnosis Date  . Asthma    MILD AS A CHILD  . Deviated septum   . Dyspnea    NORMAL SPIROMETRY 02/01/17 VISIT WITH DR HEDRICK  . Hypertension   . Sleep apnea   . Stroke (HCC)    BP 120/85   Pulse 72   Ht 5\' 11"  (1.803 m)   Wt 194 lb (88 kg)   SpO2 97%   BMI 27.06 kg/m   Opioid Risk Score:   Fall Risk Score:  `1  Depression screen PHQ 2/9  Depression screen PHQ 2/9 09/24/2017  Decreased Interest 2  Down, Depressed, Hopeless 1  PHQ - 2 Score 3  Altered sleeping 0  Tired, decreased energy 2  Change in appetite 2  Feeling bad or failure about yourself  1  Trouble concentrating 0  Moving slowly or fidgety/restless 0  Suicidal thoughts 0  PHQ-9 Score 8  Difficult doing work/chores Somewhat difficult     Review of Systems  Constitutional: Negative.   HENT: Negative.   Eyes: Negative.   Respiratory: Negative.   Cardiovascular: Negative.     Gastrointestinal: Negative.   Endocrine: Negative.   Genitourinary: Negative.   Musculoskeletal: Positive for myalgias.  Skin: Negative.   Allergic/Immunologic: Negative.   Neurological: Positive for headaches.  Hematological: Negative.   Psychiatric/Behavioral: Negative.   All other systems reviewed and are negative.      Objective:   Physical Exam  Constitutional: He is oriented to person, place, and time. He appears well-developed and well-nourished. No distress.  HENT:  Head: Normocephalic and atraumatic.  Eyes: Pupils are equal, round, and reactive to light. EOM are normal.  Neck: Normal range of motion.  Neurological: He is alert and oriented to person, place, and time. He exhibits abnormal muscle tone.  0/5 strength in the right deltoid, bicep, tricep, grip 4- right knee extensor 3- right hip flexor  0 at the ankle dorsiflexion plantar flexor Positive clonus at the right ankle sustained Right hand with MAS 0 spasticity in the finger and wrist flexors. Ambulates with a cane right AFO for foot clearance hip hiking Back has some tightness of the erector spinae muscle group on the left side with mild tenderness to palpation There is no evidence of scoliosis No pain over the PSIS area.  Skin: Skin is warm and dry. He is not diaphoretic.  Nursing note and vitals reviewed.         Assessment & Plan:  1.  Right spastic hemiplegia secondary left MCA distribution infarct recently had cranioplasty performed He has complex issues relating to his severe hemiplegia he has cognitive deficits related to his stroke as well.  Fortunately he does not have significant a aphasia at this point. Would recommend no driving at this point he is asking about resumption.  2 issues one is motor the other is cognitive with attention concentration lacking. Excellent relaxation of his spasticity in the right upper extremity however the right lower extremity continues to have significant clonus. He  is having problems with hip hiking to clear the right foot despite AFO usage. I do think he would benefit from a double metal upright dual channel AFO to allow for improved support as well as adjustability I will see him back for repeat botulinum toxin injection.  We will perform the lower extremity injection under ultrasound guidance.  This may enhance the benefit Discussed my recommendations with patient and his spouse Over half of the 25 min visit was spent counseling and coordinating care.

## 2018-04-09 ENCOUNTER — Encounter: Payer: Self-pay | Admitting: Emergency Medicine

## 2018-04-09 ENCOUNTER — Emergency Department: Payer: BLUE CROSS/BLUE SHIELD

## 2018-04-09 ENCOUNTER — Other Ambulatory Visit: Payer: Self-pay

## 2018-04-09 ENCOUNTER — Emergency Department
Admission: EM | Admit: 2018-04-09 | Discharge: 2018-04-09 | Disposition: A | Discharge status: Home / Self Care | Payer: BLUE CROSS/BLUE SHIELD | Attending: Emergency Medicine | Admitting: Emergency Medicine

## 2018-04-09 DIAGNOSIS — J45909 Unspecified asthma, uncomplicated: Secondary | ICD-10-CM | POA: Diagnosis not present

## 2018-04-09 DIAGNOSIS — Z7982 Long term (current) use of aspirin: Secondary | ICD-10-CM | POA: Insufficient documentation

## 2018-04-09 DIAGNOSIS — N5089 Other specified disorders of the male genital organs: Secondary | ICD-10-CM | POA: Diagnosis present

## 2018-04-09 DIAGNOSIS — I10 Essential (primary) hypertension: Secondary | ICD-10-CM | POA: Diagnosis not present

## 2018-04-09 DIAGNOSIS — N5082 Scrotal pain: Secondary | ICD-10-CM | POA: Insufficient documentation

## 2018-04-09 DIAGNOSIS — N492 Inflammatory disorders of scrotum: Secondary | ICD-10-CM | POA: Diagnosis not present

## 2018-04-09 DIAGNOSIS — Z79899 Other long term (current) drug therapy: Secondary | ICD-10-CM | POA: Insufficient documentation

## 2018-04-09 LAB — CBC
HEMATOCRIT: 35.4 % — AB (ref 40.0–52.0)
HEMOGLOBIN: 12.4 g/dL — AB (ref 13.0–18.0)
MCH: 30.7 pg (ref 26.0–34.0)
MCHC: 35.1 g/dL (ref 32.0–36.0)
MCV: 87.5 fL (ref 80.0–100.0)
Platelets: 388 10*3/uL (ref 150–440)
RBC: 4.04 MIL/uL — AB (ref 4.40–5.90)
RDW: 13.4 % (ref 11.5–14.5)
WBC: 9.1 10*3/uL (ref 3.8–10.6)

## 2018-04-09 LAB — BASIC METABOLIC PANEL
Anion gap: 7 (ref 5–15)
BUN: 12 mg/dL (ref 6–20)
CHLORIDE: 102 mmol/L (ref 98–111)
CO2: 29 mmol/L (ref 22–32)
CREATININE: 0.94 mg/dL (ref 0.61–1.24)
Calcium: 9 mg/dL (ref 8.9–10.3)
GFR calc non Af Amer: >60 mL/min (ref >60)
Glucose, Bld: 96 mg/dL (ref 70–99)
POTASSIUM: 3.4 mmol/L — AB (ref 3.5–5.1)
Sodium: 138 mmol/L (ref 135–145)

## 2018-04-09 MED ORDER — LIDOCAINE HCL (PF) 1 % IJ SOLN
INTRAMUSCULAR | Status: AC
  Filled 2018-04-09: qty 5

## 2018-04-09 MED ORDER — ONDANSETRON HCL 4 MG/2ML IJ SOLN
4.0000 mg | Freq: Once | INTRAMUSCULAR | Status: AC
  Administered 2018-04-09: 4 mg via INTRAVENOUS
  Filled 2018-04-09: qty 2

## 2018-04-09 MED ORDER — CLINDAMYCIN HCL 300 MG PO CAPS: 300.0000 mg | ORAL_CAPSULE | Freq: Four times a day (QID) | ORAL | 0 refills | Status: AC

## 2018-04-09 MED ORDER — IOPAMIDOL (ISOVUE-300) INJECTION 61%
100.0000 mL | Freq: Once | INTRAVENOUS | Status: AC | PRN
  Administered 2018-04-09: 100 mL via INTRAVENOUS

## 2018-04-09 MED ORDER — MORPHINE SULFATE (PF) 4 MG/ML IV SOLN
4.0000 mg | Freq: Once | INTRAVENOUS | Status: AC
  Administered 2018-04-09: 4 mg via INTRAVENOUS
  Filled 2018-04-09: qty 1

## 2018-04-09 MED ORDER — VANCOMYCIN HCL IN DEXTROSE 1-5 GM/200ML-% IV SOLN
1000.0000 mg | Freq: Once | INTRAVENOUS | Status: AC
  Administered 2018-04-09: 1000 mg via INTRAVENOUS
  Filled 2018-04-09: qty 200

## 2018-04-09 NOTE — ED Provider Notes (Addendum)
[facility] Emergency Department Provider Note  Time seen: 3:12 PM  I have reviewed the triage vital signs and the nursing notes.   HISTORY  Chief Complaint Scrotal swelling   HPI Jeremy [name] is a 42 y.o. male with a past medical history of CVA, hypertension, status post hemicraniectomy for cerebral edema after the CVA, complicated by DVT/PE placed on Eliquis, presents to the emergency department for pain redness and swelling to his scrotum.  According to the patient and wife patient developed the pain and swelling to the bottom of his scrotum.  Today they noticed that the area was draining pus and blood so they came to the emergency department for evaluation.  Denies any fever, abdominal pain, nausea vomiting or diarrhea, no dysuria.  No history of abscess in the past.  Contrary to triage chief complaint there is no rectal bleeding complaint.  Past Medical History:  Diagnosis Date  . Asthma    MILD AS A CHILD  . Deviated septum   . Dyspnea    NORMAL SPIROMETRY 02/01/17 VISIT WITH DR HEDRICK  . Hypertension   . Sleep apnea   . Stroke [facility](HCC)     Patient Active Problem List   Diagnosis Date Noted  . History of cranioplasty 03/19/2018  . PE (pulmonary thromboembolism) (HCC) 10/06/2017  . DVT (deep vein thrombosis) in pregnancy (HCC) 10/06/2017  . Acute blood loss anemia   . Vascular headache   . Spastic hemiplegia of right dominant side as late effect of cerebral infarction (HCC)   . Benign essential HTN   . Frontal lobe and executive function deficit following cerebral infarction   . Adjustment disorder with mixed anxiety and depressed mood   . 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 Aphasia due to acute stroke (HCC) 08/07/2017  . B12 deficiency   . Acute respiratory failure (HCC)   . Central line infiltration (HCC)   . Endotracheal tube present   . Fever   . Leukocytosis   . S/P craniotomy 07/30/2017  . Cerebral edema (HCC) 07/28/2017  .  Hyperlipidemia 07/28/2017  . Stroke (cerebrum) (HCC) 07/27/2017    Past Surgical History:  Procedure Laterality Date  . CRANIOPLASTY N/A 03/19/2018   Procedure: CRANIOPLASTY;  Surgeon: Lisbeth RenshawNundkumar, Neelesh, MD;  [facility];  Service: Neurosurgery;  Laterality: N/A;  CRANIOPLASTY  . CRANIOTOMY Left 07/29/2017   Procedure: LEFT HEMI- CRANIECTOMY WITH PLACEMENT OF BONE FLAP IN ABDOMEN;  Surgeon: Lisbeth RenshawNundkumar, Neelesh, MD;  Location: [facility] OR;  Service: Neurosurgery;  Laterality: Left;  . IR ANGIO INTRA EXTRACRAN SEL COM CAROTID INNOMINATE UNI R MOD SED  07/28/2017  . IR ANGIO VERTEBRAL SEL VERTEBRAL UNI R MOD SED  07/28/2017  . IR CT HEAD LTD  07/28/2017  . IR PERCUTANEOUS ART THROMBECTOMY/INFUSION INTRACRANIAL INC DIAG ANGIO  07/28/2017  . NASAL SEPTOPLASTY W/ TURBINOPLASTY Bilateral 03/06/2017   Procedure: NASAL SEPTOPLASTY WITH TURBINATE REDUCTION;  Surgeon: Linus SalmonsMcQueen, Chapman, MD;  Location: [facility] ORS;  Service: ENT;  Laterality: Bilateral;  . RADIOLOGY WITH ANESTHESIA N/A 07/27/2017   Procedure: RADIOLOGY WITH ANESTHESIA;  Surgeon: Julieanne Cottoneveshwar, Sanjeev, MD;  Location: [facility] OR;  Service: Radiology;  Laterality: N/A;    Prior to Admission medications   Medication Sig Start Date End Date Taking? Authorizing Provider  amLODipine (NORVASC) 5 MG tablet Take 5 mg by mouth daily.     [provider]  aspirin EC 81 MG tablet Take 1 tablet (81 mg total) by mouth daily. 03/26/18   Costella, Darci CurrentVincent J,  PA-C  escitalopram (LEXAPRO) 10 MG tablet Take 1 tablet (10 mg total) by mouth daily. 09/07/17   Angiulli, Mcarthur Rossetti, PA-C  ezetimibe (ZETIA) 10 MG tablet Take by mouth. 01/31/18   [provider]  gabapentin (NEURONTIN) 400 MG capsule TAKE ONE CAPSULE BY MOUTH THREE TIMES A DAY 03/19/18   Kirsteins, Victorino Sparrow, MD  HYDROcodone-acetaminophen (NORCO/VICODIN) 5-325 MG tablet Take 1 tablet by mouth every 4 (four) hours as needed for moderate pain. 03/20/18   Costella, Darci Current, PA-C  levETIRAcetam (KEPPRA) 500 MG  tablet Take 1 tablet (500 mg total) by mouth 2 (two) times daily. 03/20/18   Costella, Darci Current, PA-C  loratadine (CLARITIN) 10 MG tablet Take 10 mg by mouth daily.    [provider]  rosuvastatin (CRESTOR) 10 MG tablet Take 10 mg by mouth daily.    [provider]  spironolactone (ALDACTONE) 50 MG tablet Take 100 mg by mouth daily.     [provider]    Allergies  Allergen Reactions  . Blood-Group Specific Substance Other (See Comments)    PT [facility] WITNESS DOES NOT ACCEPT BLOOD PRODUCTS  . Lipitor [Atorvastatin Calcium] Other (See Comments)    myalgia    Family History  Problem Relation Age of Onset  . Hypertension Father   . Hypertension Brother     Social History Social History   Tobacco Use  . Smoking status: Never Smoker  . Smokeless tobacco: Never Used  Substance Use Topics  . Alcohol use: Not Currently    Comment: RARE  . Drug use: No    Review of Systems Constitutional: Negative for fever. Cardiovascular: Negative for chest pain. Respiratory: Negative for shortness of breath. Gastrointestinal: Negative for abdominal pain, vomiting and diarrhea. Genitourinary: Negative for urinary compaints.  Redness swelling and discomfort to scrotum. Musculoskeletal: Negative for musculoskeletal complaints Skin: Negative for skin complaints  Neurological: Negative for headache All other ROS negative  ____________________________________________   PHYSICAL EXAM:  VITAL SIGNS: ED Triage Vitals [04/09/18 1423]  Enc Vitals Group     BP 134/90     Pulse Rate 88     Resp 18     Temp 98.8 F (37.1 C)     Temp Source Oral     SpO2 98 %     Weight 194 lb 0.1 oz (88 kg)     Height 5\' 11"  (1.803 m)     Head Circumference      Peak Flow      Pain Score 4     Pain Loc      Pain Edu?      Excl. in GC?    Constitutional: Alert and oriented. Well appearing and in no distress. Eyes: Normal exam ENT   Head: Normocephalic and  atraumatic.   Mouth/Throat: Mucous membranes are moist. Cardiovascular: Normal rate, regular rhythm. No murmur Respiratory: Normal respiratory effort without tachypnea nor retractions. Breath sounds are clear Gastrointestinal: Soft and nontender. No distention.   GU: Patient has an area of induration with central leaking of mild amount of blood/exudate consistent with abscess to the inferior portion of the scrotum.  Scrotum otherwise does not appear to be largely infected may be slightly more swollen, testicles are nontender.  No involvement of the perineum. Musculoskeletal: Nontender with normal range of motion in all extremities.  Neurologic: Right-sided deficits from prior stroke, mild speech difficulties from prior stroke.  No new deficits. Skin:  Skin is warm.  There is an area of induration tenderness to  the inferior portion of the scrotum consistent with abscess. Psychiatric: Mood and affect are normal.   ____________________________________________    RADIOLOGY  IMPRESSION: Small superficial abscess of the posterior left scrotum, measuring 16 x 14 mm. Mild skin thickening may indicate scrotal cellulitis.  ____________________________________________   INITIAL IMPRESSION / ASSESSMENT AND PLAN / ED COURSE  Pertinent labs & imaging results that were available during my care of the patient were reviewed by me and considered in my medical decision making (see chart for details).  Patient presents to the emergency department with an area of swelling, tenderness and drainage to the posterior/inferior part of the scrotum.  Differential would include abscess, cellulitis.  Patient is afebrile with otherwise reassuring vitals.  However given the location of the abscess we will obtain CT imaging of the pelvis to help rule out a deeper infection.  We will check basic labs and dose a course of IV antibiotics while awaiting imaging results.  The area is draining at this time.  As long as the  CT does not show deeper infection I believe the patient would benefit from an incision and drainage of the area to ensure proper drainage of the abscess and discharged on oral antibiotics.  CT scan consistent with superficial abscess.  I performed incision and drainage and packed with iodoform gauze.  We will discharge on clindamycin.  Received 1 g of vancomycin in the emergency department via IV.  Reassuringly labs are normal including white blood cell count.   INCISION AND DRAINAGE Performed by: Minna Antis Consent: Verbal consent obtained. Risks and benefits: risks, benefits and alternatives were discussed Type: abscess  Body area: scrotum  Anesthesia: local infiltration  Incision was made with a scalpel.  Local anesthetic: lidocaine 1% wo epinephrine  Anesthetic total: 3.5 ml  Complexity: complex Blunt dissection to break up loculations  Drainage: purulent  Drainage amount: 3-5cc  Packing material: 1/4 in iodoform gauze  Patient tolerance: Patient tolerated the procedure well with no immediate complications.     ____________________________________________   FINAL CLINICAL IMPRESSION(S) / ED DIAGNOSES  abscess  cellulitis    Minna Antis, MD 04/09/18 1659    Minna Antis, MD 04/09/18 1718

## 2018-04-09 NOTE — Discharge Instructions (Addendum)
As we discussed please remove the packing in 48 hours.  Please keep the area covered with a bandage or gauze.  Expect oozing/bleeding for the next 48 hours.  Return to the emergency department for any significant bleeding, any increase in swelling or redness of the scrotum, development of fever, or any other symptom personally concerning to yourself.

## 2018-04-09 NOTE — ED Triage Notes (Signed)
Patient's family reports noticing "bump" around rectum that has bother him a couple of days. Reports today noticing bleeding and white drainage from area. Patient on eloquis and aspirin from previous stroke. Denies fevers

## 2018-04-15 ENCOUNTER — Other Ambulatory Visit: Payer: Self-pay

## 2018-04-15 ENCOUNTER — Other Ambulatory Visit: Payer: Self-pay | Admitting: Physical Medicine & Rehabilitation

## 2018-04-15 ENCOUNTER — Encounter: Payer: Self-pay | Admitting: Occupational Therapy

## 2018-04-15 ENCOUNTER — Ambulatory Visit: Payer: BLUE CROSS/BLUE SHIELD | Attending: Physical Medicine & Rehabilitation | Admitting: Occupational Therapy

## 2018-04-15 ENCOUNTER — Ambulatory Visit: Payer: BLUE CROSS/BLUE SHIELD | Admitting: Physical Therapy

## 2018-04-15 VITALS — BP 111/87 | HR 76

## 2018-04-15 DIAGNOSIS — R2689 Other abnormalities of gait and mobility: Secondary | ICD-10-CM | POA: Insufficient documentation

## 2018-04-15 DIAGNOSIS — I69353 Hemiplegia and hemiparesis following cerebral infarction affecting right non-dominant side: Secondary | ICD-10-CM

## 2018-04-15 DIAGNOSIS — M25511 Pain in right shoulder: Secondary | ICD-10-CM | POA: Diagnosis present

## 2018-04-15 DIAGNOSIS — R278 Other lack of coordination: Secondary | ICD-10-CM | POA: Diagnosis present

## 2018-04-15 DIAGNOSIS — M6281 Muscle weakness (generalized): Secondary | ICD-10-CM

## 2018-04-15 NOTE — Patient Instructions (Signed)
Access Code: [email or web address]  URL: [email or web address]  Date: 04/15/2018  Prepared by: Encarnacion Chu   Exercises  Supine March - 10 reps - 2 sets - 2x daily - 7x weekly  Supine Hip Abduction - 10 reps - 2 sets - 2x daily - 7x weekly

## 2018-04-15 NOTE — Therapy (Signed)
[place] [facility] MAIN [facility] SERVICES [address] [place], [place], [address] Phone: [phone]   Fax:  [phone]  Occupational Therapy Evaluation  Patient Details  Name: Jeremy [name] MRN: [id number] Date of Birth: [birth year] No data recorded  Encounter Date: 04/15/2018  OT End of Session - 04/15/18 1915    Visit Number  1    Number of Visits  24    Date for OT Re-Evaluation  07/08/18    Authorization Type  [employer]    Authorization Time Period  visit limit 30    OT Start Time  1116    OT Stop Time  1215    OT Time Calculation (min)  59 min    Activity Tolerance  Patient tolerated treatment well    Behavior During Therapy  [facility] for tasks assessed/performed       Past Medical History:  Diagnosis Date  . Asthma    MILD AS A CHILD  . Deviated septum   . Dyspnea    NORMAL SPIROMETRY 02/01/17 VISIT WITH DR HEDRICK  . Hypertension   . Sleep apnea   . Stroke [facility])     Past Surgical History:  Procedure Laterality Date  . CRANIOPLASTY N/A 03/19/2018   Procedure: CRANIOPLASTY;  Surgeon: Lisbeth Renshaw, MD;  Location: [facility] OR;  Service: Neurosurgery;  Laterality: N/A;  CRANIOPLASTY  . CRANIOTOMY Left 07/29/2017   Procedure: LEFT HEMI- CRANIECTOMY WITH PLACEMENT OF BONE FLAP IN ABDOMEN;  Surgeon: Lisbeth Renshaw, MD;  Location: [facility] OR;  Service: Neurosurgery;  Laterality: Left;  . IR ANGIO INTRA EXTRACRAN SEL COM CAROTID INNOMINATE UNI R MOD SED  07/28/2017  . IR ANGIO VERTEBRAL SEL VERTEBRAL UNI R MOD SED  07/28/2017  . IR CT HEAD LTD  07/28/2017  . IR PERCUTANEOUS ART THROMBECTOMY/INFUSION INTRACRANIAL INC DIAG ANGIO  07/28/2017  . NASAL SEPTOPLASTY W/ TURBINOPLASTY Bilateral 03/06/2017   Procedure: NASAL SEPTOPLASTY WITH TURBINATE REDUCTION;  Surgeon: Linus Salmons, MD;  Location: [facility] ORS;  Service: ENT;  Laterality: Bilateral;  . RADIOLOGY WITH ANESTHESIA N/A 07/27/2017   Procedure: RADIOLOGY WITH ANESTHESIA;  Surgeon: Julieanne Cotton, MD;   Location: [facility] OR;  Service: Radiology;  Laterality: N/A;    There were no vitals filed for this visit.  Subjective Assessment - 04/15/18 1906    Subjective   Patient talking minimally during session and encouraged to use words when answering questions rather than shaking his head and looking to wife to answer.     Patient is accompained by:  Family member    Pertinent History  Pt is a 42 y/o M who presented on Jul 27, 2017 with R sided weakness, headache, slurred speech. Cranial CT scan showed L insula and frontal operculum lucency, compatible with infarction. Underwent attempted mechanical thrombectomy per Interventional Radiology that was unsuccessful, required intubation. Follow-up MRI showed cerebellar edema, midline shift. Underwent L decompressive hemicraniectomy, placement of bone flap in abdominal pocket Jan 13th, 2019. Pt went to CIR at [facility] until 2/21 and then received HHPT until end of April, followed by therapy at this clinic, then OPPT at [facility] probono [facility] once his insurance benefits ran out.  Patient now has new insurance and has returned to therapy for OT evaluation.      Patient Stated Goals  Patient would like to regain ability to take care of self.    Currently in Pain?  Yes    Pain Score  4     Pain Location  Shoulder  Pain Orientation  Right    Pain Descriptors / Indicators  Aching    Pain Onset  More than a month ago    Pain Frequency  Intermittent    Multiple Pain Sites  No        [facility] OT Assessment - 04/15/18 1141      Assessment   Medical Diagnosis  Spastic hemiparesis of L nondominant side due to acute cerebral infarction    Onset Date/Surgical Date  07/27/17    Hand Dominance  Left    Prior Therapy  Yes, CIR, HHPT, this clinic, [place] [facility]      Precautions   Precautions  Other (comment);Fall    Precaution Comments  recent bone flap removal from abdomen (careful with gait belt), wears R shoulder brace due to subluxation    Required  Braces or Orthoses  Other Brace/Splint    Other Brace/Splint  R shoulder subluxation brace, R AFO      Restrictions   Weight Bearing Restrictions  No      Home  Environment   Family/patient expects to be discharged to:  Private residence    Living Arrangements  Spouse/significant other    Available Help at Discharge  Family    Type of Home  Aartment    Home Access  Level entry    Home Layout  One level    Bathroom Shower/Tub  Tub/Shower unit;Curtain    Shower/tub characteristics  Curtain    Bathroom Accessibility  Yes    How accessible  Accessible via wheelchair;Accessible via walker    Home Equipment  Cane -quad    Lives With  Spouse      Prior Function   Level of Independence  Independent with household mobility with device;Needs assistance with transfers;Needs assistance with homemaking    Vocation  On disability   on long term disability   Leisure  spending time with family, wants to get back to cooking      ADL   Eating/Feeding  Needs assist with cutting food    Grooming  Minimal assistance    Upper Body Bathing  Minimal assistance    Lower Body Bathing  Supervision/safety    Upper Body Dressing  Minimal assistance    Lower Body Dressing  Moderate assistance    Toilet Transfer  Min guard    Toileting - Clothing Manipulation  Minimal assistance    Tub/Shower Transfer  Minimal assistance    ADL comments  Patient currently requires assistance with self care tasks, he requires assist with tub transfer with wife having to lift leg over tub, and then able to bathe self from seated position with use of left dominant hand only and occasional assist..  He requires assist with being able to pull up and negotiate his clothing over his hips with dressing and toileting, requires assist with buttons and zippers.  He has an AFO on his right LE which he cannot don, he ambulates with a quad cane inside the home and will often get a motorized cart if shopping with wife.  Patient wears splint  on hand at night but cannot don.  Brace to right shoulder to help with stability and minimize subluxation, currently 1 finger subluxation noted. Patient also requires assistance for shaving.       IADL   Prior Level of Function Shopping  independent    Shopping  Needs to be accompanied on any shopping trip    Prior Level of Function Light Housekeeping  independent  Light Housekeeping  Does not participate in any housekeeping tasks    Prior Level of Function Meal Prep  independent    Meal Prep  Needs to have meals prepared and served    Prior Level of Function Biomedical scientist on family or friends for transportation    Prior Level of Function Medication Managment  independent    Medication Management  Is not capable of dispensing or managing own medication    Prior Level of Function Financial Management  independent    Financial Management  Dependent      Mobility   Mobility Status  Needs assist    Mobility Status Comments  uses quad cane now      Written Expression   Dominant Hand  Left      Vision - History   Baseline Vision  Wears glasses all the time    Additional Comments  right sided visual field deficits, inattention      Cognition   Overall Cognitive Status  Impaired/Different from baseline    Area of Impairment  Safety/judgement;Problem solving    Safety/Judgement  Decreased awareness of safety;Decreased awareness of deficits    Problem Solving  Requires verbal cues    Problem Solving  Impaired      Posture/Postural Control   Posture/Postural Control  Postural limitations    Postural Limitations  Rounded Shoulders;Forward head;Posterior pelvic tilt;Flexed trunk;Weight shift left;Increased thoracic kyphosis    Posture Comments  Pt sacral sitting      Sensation   Light Touch  Impaired Detail    Light Touch Impaired Details  Absent RUE    Stereognosis  Impaired Detail    Stereognosis Impaired Details  Absent RUE     Hot/Cold  Impaired Detail    Hot/Cold Impaired Details  Absent RUE    Proprioception  Impaired Detail      Coordination   Gross Motor Movements are Fluid and Coordinated  No    Fine Motor Movements are Fluid and Coordinated  No    9 Hole Peg Test  Right;Left    Left 9 Hole Peg Test  25 secs      AROM   Overall AROM   Deficits      PROM   Overall PROM   Deficits    Overall PROM Comments  1 finger subluxation on the right, therefore PROM only to shoulder height with joint approximated. Pain present 4/10 at the right shoulder      Strength   Overall Strength  Deficits    Right Hip Flexion  2+/5    Left Hip Flexion  5/5    Right Knee Flexion  1/5    Right Knee Extension  2/5    Left Knee Flexion  5/5    Left Knee Extension  5/5    Right Ankle Dorsiflexion  1/5    Right Ankle Plantar Flexion  1/5    Left Ankle Dorsiflexion  5/5    Left Ankle Plantar Flexion  5/5      Hand Function   Left Hand Grip (lbs)  55    Left Hand Lateral Pinch  18 lbs    Left 3 point pinch  15 lbs      RLE Tone   RLE Tone  Hypertonic        4/10 pain with PROM right shoulder No active movement in shoulder, elbow, wrist or hand Increased flexor tone especially with elbow flexion .  Sensation remains absent in RUE>               OT Education - 04/15/18 1914    Education provided  Yes    Education Details  POC, goals, Role of OT    Person(s) Educated  Patient    Methods  Explanation;Demonstration;Verbal cues    Comprehension  Verbalized understanding;Returned demonstration          OT Long Term Goals - 04/15/18 1918      OT LONG TERM GOAL #1   Title  Patient will demonstrate improved balance to perform clothing negotiation with dressing/toileting with supervision only.     Baseline  min assist    Time  12    Period  Weeks    Status  New    Target Date  07/08/18      OT LONG TERM GOAL #2   Title  Patient will demonstrate ability to manage shoulder brace with modified  independence.    Baseline  unable at eval    Time  4    Period  Weeks    Status  New    Target Date  05/13/18      OT LONG TERM GOAL #3   Title  Patient will demonstrate trace movement of elbow extension with facilitation to work towards neuromuscular reeducation for active arm use on the right .    Baseline  no active movement at eval    Time  12    Period  Weeks    Status  New    Target Date  07/08/18      OT LONG TERM GOAL #4   Title  Patient will demonstate understanding of compensatory strategies to protect right UE from harm during engagement  in daily activities.     Baseline  sensation absent in right UE and at risk for injury.    Time  3    Period  Weeks    Status  New    Target Date  05/06/18      OT LONG TERM GOAL #5   Title  Patient will complete cutting meat one handed with modified independence with adaptive equipment as needed.     Baseline  unable at eval    Time  12    Period  Weeks    Status  New    Target Date  07/08/18      Long Term Additional Goals   Additional Long Term Goals  Yes      OT LONG TERM GOAL #6   Title  Patient will complete UB dressing with modified independence    Baseline  min assist     Time  12    Period  Weeks    Status  New    Target Date  07/08/18      OT LONG TERM GOAL #7   Title  Patient will complete shoe and sock donning with modified independence.    Baseline  unable at eval    Time  12    Period  Weeks    Status  New    Target Date  07/08/18      OT LONG TERM GOAL #8   Title  Patient will complete lower body dressing with occasional assist for buttons/zippers.     Baseline  min assist    Time  12    Period  Weeks    Status  New    Target Date  07/08/18  Plan - 04/15/18 1916    Clinical Impression Statement  Patient is a 42 yo male who suffered a CVA with right nondominant hemiplegia earlier this year, he had therapy but exhausted his insurance visits.  He underwent surgery on 03-19-2018 to place  bone flap back in. He has returned for reevaluation as follows:  Patient presents with right hemiplegia, lack of sensation, lack of coordination, subluxation of right shoulder, pain, aphasia, decreased cognitive processing, decreased ability to perform self care, IADL tasks, decreased transfers, balance and functional mobility.  He would benefit from skilled OT to address the above limitations and maximize his safety and independence in daily tasks.  He received his first injection of BOTOX in the right UE mid august and will receive another round in November.     Occupational Profile and client history currently impacting functional performance  absent sensation in RUE from shoulder to digits, no active right UE movement, decreased right peripheral vision, has 16 month old infant at home, recent move from 3rd story apartment, unable to work, bone flap replaced in 9/19    Occupational performance deficits (Please refer to evaluation for details):  ADL's;IADL's;Leisure;Work    Electrical engineer    Current Impairments/barriers affecting progress:  positive:  motivation, family support.  Negative:  young age, lack of sensation on the right for light touch, sharp/dull, deep pressure, hot/cold, processing delay cognitively    OT Frequency  2x / week    OT Duration  12 weeks    OT Treatment/Interventions  Self-care/ADL training;Therapeutic exercise;Neuromuscular education;Patient/family education;Building services engineer;Therapeutic activities;Balance training;DME and/or AE instruction;Manual Therapy;Passive range of motion    Clinical Decision Making  Several treatment options, min-mod task modification necessary    Consulted and Agree with Plan of Care  Patient;Family member/caregiver    Family Member Consulted  wife Melanie       Patient will benefit from skilled therapeutic intervention in order to improve the following deficits and impairments:  Abnormal gait, Decreased cognition, Decreased  knowledge of use of DME, Increased edema, Pain, Decreased coordination, Decreased mobility, Impaired sensation, Decreased activity tolerance, Decreased endurance, Decreased range of motion, Decreased strength, Impaired tone, Decreased balance, Decreased knowledge of precautions, Decreased safety awareness, Difficulty walking, Impaired UE functional use  Visit Diagnosis: Hemiplegia and hemiparesis following cerebral infarction affecting right non-dominant side (HCC)  Muscle weakness (generalized)  Other lack of coordination  Acute pain of right shoulder    Problem List Patient Active Problem List   Diagnosis Date Noted  . History of cranioplasty 03/19/2018  . PE (pulmonary thromboembolism) (HCC) 10/06/2017  . DVT (deep vein thrombosis) in pregnancy (HCC) 10/06/2017  . Acute blood loss anemia   . Vascular headache   . Spastic hemiplegia of right dominant side as late effect of cerebral infarction (HCC)   . Benign essential HTN   . Frontal lobe and executive function deficit following cerebral infarction   . Adjustment disorder with mixed anxiety and depressed mood   . 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 Aphasia due to acute stroke (HCC) 08/07/2017  . B12 deficiency   . Acute respiratory failure (HCC)   . Central line infiltration (HCC)   . Endotracheal tube present   . Fever   . Leukocytosis   . S/P craniotomy 07/30/2017  . Cerebral edema (HCC) 07/28/2017  . Hyperlipidemia 07/28/2017  . Stroke (cerebrum) (HCC) 07/27/2017   Amy T Lovett, OTR/L, CLT, Lovett,Amy 04/15/2018, 7:24 PM  [facility] MAIN  [facility] SERVICES [address] [place], [place], [address] Phone: [phone]   Fax:  [phone]  Name: Jeremy [name] MRN: [id number] Date of Birth: [birth year]

## 2018-04-15 NOTE — Therapy (Signed)
[facility] MAIN [facility] SERVICES [address] [place], [place], [address] Phone: [phone]   Fax:  [phone]  Physical Therapy Evaluation  Patient Details  Name: Jeremy [name] MRN: [id number] Date of Birth: 10/09/1975 Referring Provider (PT): Dr. Claudette Laws   Encounter Date: 04/15/2018  PT End of Session - 04/15/18 1157    Visit Number  1    Number of Visits  17    Date for PT Re-Evaluation  06/10/18    Authorization Type  30 visits total for OT and PT combined     Authorization Time Period  1/15 PT visit    PT Start Time  1032    PT Stop Time  1114    PT Time Calculation (min)  42 min    Equipment Utilized During Treatment  Gait belt;Other (comment)   R AFO, R subluxation brace   Activity Tolerance  Patient tolerated treatment well    Behavior During Therapy  WFL for tasks assessed/performed       Past Medical History:  Diagnosis Date  . Asthma    MILD AS A CHILD  . Deviated septum   . Dyspnea    NORMAL SPIROMETRY 02/01/17 VISIT WITH DR HEDRICK  . Hypertension   . Sleep apnea   . Stroke [facility])     Past Surgical History:  Procedure Laterality Date  . CRANIOPLASTY N/A 03/19/2018   Procedure: CRANIOPLASTY;  Surgeon: Lisbeth Renshaw, MD;  Location: [facility] OR;  Service: Neurosurgery;  Laterality: N/A;  CRANIOPLASTY  . CRANIOTOMY Left 07/29/2017   Procedure: LEFT HEMI- CRANIECTOMY WITH PLACEMENT OF BONE FLAP IN ABDOMEN;  Surgeon: Lisbeth Renshaw, MD;  Location: [facility] OR;  Service: Neurosurgery;  Laterality: Left;  . IR ANGIO INTRA EXTRACRAN SEL COM CAROTID INNOMINATE UNI R MOD SED  07/28/2017  . IR ANGIO VERTEBRAL SEL VERTEBRAL UNI R MOD SED  07/28/2017  . IR CT HEAD LTD  07/28/2017  . IR PERCUTANEOUS ART THROMBECTOMY/INFUSION INTRACRANIAL INC DIAG ANGIO  07/28/2017  . NASAL SEPTOPLASTY W/ TURBINOPLASTY Bilateral 03/06/2017   Procedure: NASAL SEPTOPLASTY WITH TURBINATE REDUCTION;  Surgeon: Linus Salmons, MD;  Location: [facility] ORS;   Service: ENT;  Laterality: Bilateral;  . RADIOLOGY WITH ANESTHESIA N/A 07/27/2017   Procedure: RADIOLOGY WITH ANESTHESIA;  Surgeon: Julieanne Cotton, MD;  Location: [facility] OR;  Service: Radiology;  Laterality: N/A;    Vitals:   04/15/18 1038  BP: 111/87  Pulse: 76     Subjective Assessment - 04/15/18 1305    Subjective  "I would like to be able to return to work and cooking"    Patient is accompained by:  Family member   wife and daughter   Pertinent History  Pt is a 42 y/o M who presented on Jul 27, 2017 with R sided weakness, headache, slurred speech. Cranial CT scan showed L insula and frontal operculum lucency, compatible with infarction. Underwent attempted mechanical thrombectomy per Interventional Radiology that was unsuccessful, required intubation. Follow-up MRI showed cerebellar edema, midline shift. Underwent L decompressive hemicraniectomy, placement of bone flap in abdominal pocket Jan 13th, 2019. Pt went to CIR at [facility] until 2/21 and then received HHPT until end of April, followed by therapy at this clinic, then OPPT at [facility]. Pt now ambulating with quad cane at all times, limited primarily to in home ambulation. Uses riding cart at store. Pt is now able to go up/down curb on his own. Wife assists with donning sock and shoe on R, shirt  on the R. Wife assist bringing RLE into tub but pt shower independently. Pt requires occasional assist with RLE for supine<>sit. Pt's goals: reduce L low back spasms (believe to be due to weight shift to L), walking longer, to get back to work, to be able to cook a full meal, improve his balance, to be able to carry something while walking. Pt had Botox injections Aug 2019 in RLE and RUE. Plan is for repeat on Nov 1st. Pt with bone flap surgery ~1 month ago, pt denies precautions with this. Pt with h/o DVT in LLE and small PE in March 2019.    Limitations  Standing;Walking;House hold activities;Lifting    Patient Stated Goals   see above    Currently in Pain?  No/denies         [facility] PT Assessment - 04/15/18 1046      Assessment   Medical Diagnosis  Spastic hemiparesis of L nondominant side due to acute cerebral infarction    Referring Provider (PT)  Dr. Claudette Laws    Onset Date/Surgical Date  07/27/17    Hand Dominance  Left    Prior Therapy  Yes, CIR, HHPT, this clinic, [place] [facility]      Precautions   Precautions  Other (comment);Fall    Precaution Comments  recent bone flap removal from abdomen (careful with gait belt), wears R shoulder brace due to subluxation    Required Braces or Orthoses  Other Brace/Splint    Other Brace/Splint  R shoulder subluxation brace, R AFO      Restrictions   Weight Bearing Restrictions  No      Balance Screen   Has the patient fallen in the past 6 months  No      Home Environment   Living Environment  Private residence    Living Arrangements  Spouse/significant other;Children    Available Help at Discharge  Family    Type of Home  Apartment    Home Access  Level entry    Home Layout  One level    Home Equipment  Walker - 2 wheels;Wheelchair - manual;Tub bench;Cane - quad      Prior Function   Level of Independence  Independent with household mobility with device;Needs assistance with transfers;Needs assistance with homemaking    Vocation  On disability   on long term disability   Leisure  spending time with family, wants to get back to cooking      Cognition   Overall Cognitive Status  Impaired/Different from baseline    Area of Impairment  Safety/judgement;Problem solving    Safety/Judgement  Decreased awareness of safety;Decreased awareness of deficits    Problem Solving  Requires verbal cues    Problem Solving  Impaired      Observation/Other Assessments   Observations  velcro strap on AFO loose      Coordination   Gross Motor Movements are Fluid and Coordinated  No      Posture/Postural Control   Posture/Postural Control  Postural  limitations    Postural Limitations  Rounded Shoulders;Forward head;Posterior pelvic tilt;Flexed trunk;Weight shift left;Increased thoracic kyphosis    Posture Comments  Pt sacral sitting      Tone   Assessment Location  Right Lower Extremity      ROM / Strength   AROM / PROM / Strength  Strength      AROM   Overall AROM   --      Strength   Overall Strength  Deficits  Strength Assessment Site  Hip;Knee;Ankle    Right/Left Hip  Right;Left    Right Hip Flexion  2+/5    Right Hip ABduction  2-/5    Right Hip ADduction  2-/5    Left Hip Flexion  5/5    Right/Left Knee  Right;Left    Right Knee Flexion  1/5    Right Knee Extension  2-/5    Left Knee Flexion  5/5    Left Knee Extension  5/5    Right/Left Ankle  Right;Left    Right Ankle Dorsiflexion  1/5    Right Ankle Plantar Flexion  1/5    Left Ankle Dorsiflexion  5/5    Left Ankle Plantar Flexion  5/5      Bed Mobility   Bed Mobility  Supine to Sit;Sit to Supine    Supine to Sit  Minimal Assistance - Patient > 75%   assist to elevate trunk and for RUE positioning   Sit to Supine  Contact Guard/Touching assist      Transfers   Transfers  Sit to Stand;Stand to Sit;Squat Pivot Transfers    Sit to Stand  4: Min guard;With armrests;With upper extremity assist   with 1UE assist   Stand to Sit  4: Min guard;Uncontrolled descent;With armrests;With upper extremity assist   1UE assist   Squat Pivot Transfers  4: Min guard   using quad cane   Comments  Pt with flexed posture during stand pivot transfer, placing quad cane farther away from body than necessary.  With stand>sit pt with poorly controlled descent but does reach back with LUE to assist with this.  For sit>stand pt pushes up with LUE with weight shifted completely to LLE.       Ambulation/Gait   Ambulation/Gait  Yes    Ambulation/Gait Assistance  4: Min guard    Assistive device  Small based quad cane    Gait Pattern  Step-to pattern;Decreased step length -  left;Decreased stance time - right;Decreased stride length;Decreased arm swing - right;Decreased hip/knee flexion - right;Right circumduction;Right hip hike;Decreased weight shift to right;Decreased dorsiflexion - right;Right foot flat;Right genu recurvatum;Antalgic;Lateral trunk lean to left;Trunk rotated posteriorly on left;Trunk flexed;Poor foot clearance - left;Poor foot clearance - right    Ambulation Surface  Level;Indoor    Gait Comments  Cues for proper quad cane placement when ambulating as pt tends to place it too far ahead, resulting in flexed posture.       6 Minute Walk- Baseline   6 Minute Walk- Baseline  --      6 Minute walk- Post Test   6 Minute Walk Post Test  --      6 minute walk test results    Aerobic Endurance Distance Walked  210      RLE Tone   RLE Tone  Hypertonic      RLE Tone   Hypertonic Details  Increased tone through RLE       EXAMINATION   5xSTS with 1UE support: 20.15 sec   with quad cane: 0.22 m/s   with quad cane: 226ft   Reflexes: 4+ R patella and achilles, 2+ L patella and achilles   Clonus with DF on RLE       TREATMENT   Hooklying marching with min assist for neutral hip alignment for increased hip flexor recruitment (added to HEP). x15 each LE   Supine R hip Abduction with min assist to allow heel to slide on mat table (added to  HEP). X10           Objective measurements completed on examination: See above findings.      PT Education - 04/15/18 1312    Education provided  Yes    Education Details  HEP and handout; POC    Person(s) Educated  Patient;Spouse    Methods  Explanation;Demonstration;Verbal cues;Tactile cues;Handout    Comprehension  Verbalized understanding;Returned demonstration;Verbal cues required;Tactile cues required;Need further instruction       PT Short Term Goals - 04/15/18 1206      PT SHORT TERM GOAL #1   Title  Pt will completed HEP at least 4 days/wk for improved carryover  between sessions    Baseline  --    Time  2    Period  Weeks    Status  New        PT Long Term Goals - 04/15/18 1206      PT LONG TERM GOAL #1   Title  Pt's will improve to equal or greater than 0.6 m/s for improved safety with ambulation in the home setting    Baseline  0.22 m/s    Time  5    Period  Weeks    Status  New      PT LONG TERM GOAL #2   Title  Pt will be able to cook a full meal with min assist for improved QOL    Baseline  Pt able to perform simple meal prep (making sandwiches) but unable to cook full meal    Time  8    Period  Weeks    Status  New      PT LONG TERM GOAL #3   Title  Pt's will improve to at least 500 ft to demonstrate improved ambulatory endurance and speed    Baseline  210 ft    Time  8    Period  Weeks    Status  New      PT LONG TERM GOAL #4   Title  Pt will improve 5xSTS to equal or less than 15 seconds without UE support to demonstrate improved balance and strength    Baseline  20.15 sec with LUE assist    Time  5    Period  Weeks    Status  New      PT LONG TERM GOAL #5   Title  Pt will be able to carry objects household distances for improved independence with homemaking and cooking activities.    Baseline  Carries small objects in L hand along with quad cane    Time  3    Period  Weeks    Status  New             Plan - 04/15/18 1159    Clinical Impression Statement  Pt is a 42 y/o M s/p CVA with resultant RUE and RLE weakness and increased tone who is known to this clinic.  completed as pt has goal of increasing ambulatory endurance and distance.  Pt's and reveal decreased gait speed. Pt's 5xSTS time indicates pt has BLE weakness and impaired balance.  Cues for proper quad cane placement when ambulating as pt tends to place it too far ahead, resulting in flexed posture.  Pt with additional gait impairments, likely the culprit of his reported occasional L lower back spasms.  Discussed changing out  velcro on AFO as current velcro has lost it's stick with strap loosening with mobility.  Pt arrived 20 minutes late, limiting time.  Will completed Berg Balance Test next session.  Pt will benefit from skilled PT interventions to address the deficits listed above for improved balance, strength, and independence.      History and Personal Factors relevant to plan of care:  (+) pt has responded well to therapy in the past, very supportive wife and family  (-) severity of CVA, time since CVA, subluxed shoulder    Clinical Presentation  Evolving    Clinical Presentation due to:  Pt with recent bone flap surgery and Botox, CVA affecting multiple body systems    Clinical Decision Making  Moderate    Rehab Potential  Good    PT Frequency  2x / week    PT Duration  8 weeks    PT Treatment/Interventions  ADLs/Self Care Home Management;Aquatic Therapy;Cryotherapy;Electrical Stimulation;Iontophoresis 4mg /ml Dexamethasone;Moist Heat;Ultrasound;DME Instruction;Gait training;Stair training;Functional mobility training;Therapeutic activities;Therapeutic exercise;Balance training;Neuromuscular re-education;Cognitive remediation;Patient/family education;Orthotic Fit/Training;Manual techniques;Scar mobilization;Passive range of motion;Dry needling;Energy conservation;Splinting;Taping;Visual/perceptual remediation/compensation    PT Next Visit Plan  Berg Balance Test, weight shifting activities, standing HEP    PT Home Exercise Plan  marching in hooklying, supine hip abduction    Recommended Other Services  OT    Consulted and Agree with Plan of Care  Patient;Family member/caregiver   wife   Family Member Consulted  wife       Patient will benefit from skilled therapeutic intervention in order to improve the following deficits and impairments:  Abnormal gait, Decreased activity tolerance, Decreased balance, Decreased cognition, Decreased coordination, Decreased endurance, Decreased knowledge of precautions, Decreased  knowledge of use of DME, Decreased mobility, Decreased range of motion, Decreased safety awareness, Decreased skin integrity, Decreased scar mobility, Decreased strength, Difficulty walking, Hypomobility, Hypermobility, Increased fascial restricitons, Increased muscle spasms, Impaired perceived functional ability, Impaired flexibility, Impaired sensation, Impaired tone, Impaired UE functional use, Impaired vision/preception, Improper body mechanics, Postural dysfunction, Pain  Visit Diagnosis: Muscle weakness (generalized)  Hemiplegia and hemiparesis following cerebral infarction affecting right non-dominant side (HCC)  Other abnormalities of gait and mobility     Problem List Patient Active Problem List   Diagnosis Date Noted  . History of cranioplasty 03/19/2018  . PE (pulmonary thromboembolism) (HCC) 10/06/2017  . DVT (deep vein thrombosis) in pregnancy (HCC) 10/06/2017  . Acute blood loss anemia   . Vascular headache   . Spastic hemiplegia of right dominant side as late effect of cerebral infarction (HCC)   . Benign essential HTN   . Frontal lobe and executive function deficit following cerebral infarction   . Adjustment disorder with mixed anxiety and depressed mood   . 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 Aphasia due to acute stroke (HCC) 08/07/2017  . B12 deficiency   . Acute respiratory failure (HCC)   . Central line infiltration (HCC)   . Endotracheal tube present   . Fever   . Leukocytosis   . S/P craniotomy 07/30/2017  . Cerebral edema (HCC) 07/28/2017  . Hyperlipidemia 07/28/2017  . Stroke (cerebrum) (HCC) 07/27/2017    Encarnacion Chu PT, DPT 04/15/2018, 1:13 PM  [facility] MAIN [facility] SERVICES [address] [place], [place], [address] Phone: [phone]   Fax:  [phone]  Name: Jeremy [name] MRN: [id number] Date of Birth: 13-Oct-1975

## 2018-04-22 ENCOUNTER — Encounter: Payer: Self-pay | Admitting: Occupational Therapy

## 2018-04-22 ENCOUNTER — Ambulatory Visit: Payer: BLUE CROSS/BLUE SHIELD | Attending: Physical Medicine & Rehabilitation | Admitting: Occupational Therapy

## 2018-04-22 ENCOUNTER — Ambulatory Visit: Payer: BLUE CROSS/BLUE SHIELD

## 2018-04-22 DIAGNOSIS — I69353 Hemiplegia and hemiparesis following cerebral infarction affecting right non-dominant side: Secondary | ICD-10-CM

## 2018-04-22 DIAGNOSIS — M6281 Muscle weakness (generalized): Secondary | ICD-10-CM

## 2018-04-22 DIAGNOSIS — R2689 Other abnormalities of gait and mobility: Secondary | ICD-10-CM | POA: Insufficient documentation

## 2018-04-22 DIAGNOSIS — M25511 Pain in right shoulder: Secondary | ICD-10-CM | POA: Diagnosis present

## 2018-04-22 DIAGNOSIS — R278 Other lack of coordination: Secondary | ICD-10-CM | POA: Diagnosis present

## 2018-04-22 DIAGNOSIS — I63512 Cerebral infarction due to unspecified occlusion or stenosis of left middle cerebral artery: Secondary | ICD-10-CM | POA: Diagnosis present

## 2018-04-22 NOTE — Therapy (Signed)
[facility] MAIN [facility] SERVICES [address] [place], [place], [address] Phone: [phone]   Fax:  [phone]  Physical Therapy Treatment  Patient Details  Name: Jeremy [name] MRN: [id number] Date of Birth: 1976/04/30 Referring Provider (PT): Dr. Claudette Laws   Encounter Date: 04/22/2018  PT End of Session - 04/22/18 0930    Visit Number  2    Number of Visits  17    Date for PT Re-Evaluation  06/10/18    Authorization Type  30 visits total for OT and PT combined     Authorization Time Period  2/15 PT visit    PT Start Time  0935    PT Stop Time  1020    PT Time Calculation (min)  45 min    Equipment Utilized During Treatment  Gait belt;Other (comment)   R AFO, R subluxation brace   Activity Tolerance  Patient tolerated treatment well    Behavior During Therapy  WFL for tasks assessed/performed       Past Medical History:  Diagnosis Date  . Asthma    MILD AS A CHILD  . Deviated septum   . Dyspnea    NORMAL SPIROMETRY 02/01/17 VISIT WITH DR HEDRICK  . Hypertension   . Sleep apnea   . Stroke [facility])     Past Surgical History:  Procedure Laterality Date  . CRANIOPLASTY N/A 03/19/2018   Procedure: CRANIOPLASTY;  Surgeon: Lisbeth Renshaw, MD;  Location: [facility] OR;  Service: Neurosurgery;  Laterality: N/A;  CRANIOPLASTY  . CRANIOTOMY Left 07/29/2017   Procedure: LEFT HEMI- CRANIECTOMY WITH PLACEMENT OF BONE FLAP IN ABDOMEN;  Surgeon: Lisbeth Renshaw, MD;  Location: [facility] OR;  Service: Neurosurgery;  Laterality: Left;  . IR ANGIO INTRA EXTRACRAN SEL COM CAROTID INNOMINATE UNI R MOD SED  07/28/2017  . IR ANGIO VERTEBRAL SEL VERTEBRAL UNI R MOD SED  07/28/2017  . IR CT HEAD LTD  07/28/2017  . IR PERCUTANEOUS ART THROMBECTOMY/INFUSION INTRACRANIAL INC DIAG ANGIO  07/28/2017  . NASAL SEPTOPLASTY W/ TURBINOPLASTY Bilateral 03/06/2017   Procedure: NASAL SEPTOPLASTY WITH TURBINATE REDUCTION;  Surgeon: Linus Salmons, MD;  Location: [facility] ORS;   Service: ENT;  Laterality: Bilateral;  . RADIOLOGY WITH ANESTHESIA N/A 07/27/2017   Procedure: RADIOLOGY WITH ANESTHESIA;  Surgeon: Julieanne Cotton, MD;  Location: [facility] OR;  Service: Radiology;  Laterality: N/A;    There were no vitals filed for this visit.  Subjective Assessment - 04/22/18 0930    Subjective  Pt reports he is doing well at this time. Denies any low back pain or spasm. No specific questions or concerns at this time.     Patient is accompained by:  Family member   wife and daughter   Pertinent History  Pt is a 42 y/o M who presented on Jul 27, 2017 with R sided weakness, headache, slurred speech. Cranial CT scan showed L insula and frontal operculum lucency, compatible with infarction. Underwent attempted mechanical thrombectomy per Interventional Radiology that was unsuccessful, required intubation. Follow-up MRI showed cerebellar edema, midline shift. Underwent L decompressive hemicraniectomy, placement of bone flap in abdominal pocket Jan 13th, 2019. Pt went to CIR at [facility] until 2/21 and then received HHPT until end of April, followed by therapy at this clinic, then OPPT at [facility]. Pt now ambulating with quad cane at all times, limited primarily to in home ambulation. Uses riding cart at store. Pt is now able to go up/down curb on his own. Wife assists with  donning sock and shoe on R, shirt on the R. Wife assist bringing RLE into tub but pt shower independently. Pt requires occasional assist with RLE for supine<>sit. Pt's goals: reduce L low back spasms (believe to be due to weight shift to L), walking longer, to get back to work, to be able to cook a full meal, improve his balance, to be able to carry something while walking. Pt had Botox injections Aug 2019 in RLE and RUE. Plan is for repeat on Nov 1st. Pt with bone flap surgery ~1 month ago, pt denies precautions with this. Pt with h/o DVT in LLE and small PE in March 2019.    Limitations   Standing;Walking;House hold activities;Lifting    Patient Stated Goals  see above    Currently in Pain?  No/denies         TREATMENT   Ther-ex Hooklying marching with min assist for neutral hip alignment for increased hip flexor recruitment, blocking at pelvis to avoid compensatory strategies 2 x 10 bilateral; Supine R hip SLR abduction with gentle manual resistance 2 x 10; Supine R hip SLR adduction with no resistance but repeated cues for muscle contraction 2 x 10; Hooklying clams with RLE resistance 2 x 10; Hookliying adductor squeeze with muscle tapping to facilitate contraction 2 x 10;  Hooklying bridges with LLE elevated and extended farther than RLE to encourage WB through RLE 2 x 10; LE D2 extension pattern 2 x 10 R sidelying R oblique contraction with heavy verbal and tactile cues to laterally flex trunk and make a "rainbow" with his trunk, 2 x 10; Hooklying resisted trunk rotation at knees for oblique activation 3s hold 2 x 10 each direction; Seated manually resisted trunk flexion, extension, and lateral flexion 3s hold 2 x 10 each direction; Sit to stand from elevated mat table with LLE elevated on Airex pad and extended farther than RLE to encourage WB through RLE 2 x 10; Gait in rehab gym with assist to prevent excessive L trunk lean to encouraged less R hip circumduction during swing x 75';    Pt educated throughout session about proper posture and technique with exercises. Improved exercise technique, movement at target joints, use of target muscles after min to mod verbal, visual, tactile cues.     Pt making excellent progress with therapy and demonstrates great motivation during session. He struggles the most with weakness in R quads, hip extensors, and adductors. Pt able to improve his weight shifting to RLE during sit to stand with cues from therapist. Will continue to work on RLE strength and coordination as well as gait. Pt will benefit from PT services to address  deficits in strength, balance, and mobility in order to return to full function at home.                     PT Short Term Goals - 04/15/18 1206      PT SHORT TERM GOAL #1   Title  Pt will completed HEP at least 4 days/wk for improved carryover between sessions    Baseline  --    Time  2    Period  Weeks    Status  New        PT Long Term Goals - 04/15/18 1206      PT LONG TERM GOAL #1   Title  Pt's will improve to equal or greater than 0.6 m/s for improved safety with ambulation in the home setting  Baseline  0.22 m/s    Time  5    Period  Weeks    Status  New      PT LONG TERM GOAL #2   Title  Pt will be able to cook a full meal with min assist for improved QOL    Baseline  Pt able to perform simple meal prep (making sandwiches) but unable to cook full meal    Time  8    Period  Weeks    Status  New      PT LONG TERM GOAL #3   Title  Pt's will improve to at least 500 ft to demonstrate improved ambulatory endurance and speed    Baseline  210 ft    Time  8    Period  Weeks    Status  New      PT LONG TERM GOAL #4   Title  Pt will improve 5xSTS to equal or less than 15 seconds without UE support to demonstrate improved balance and strength    Baseline  20.15 sec with LUE assist    Time  5    Period  Weeks    Status  New      PT LONG TERM GOAL #5   Title  Pt will be able to carry objects household distances for improved independence with homemaking and cooking activities.    Baseline  Carries small objects in L hand along with quad cane    Time  3    Period  Weeks    Status  New            Plan - 04/22/18 0930    Clinical Impression Statement  Pt making excellent progress with therapy and demonstrates great motivation during session. He struggles the most with weakness in R quads, hip extensors, and adductors. Pt able to improve his weight shifting to RLE during sit to stand with cues from therapist. Will continue to work on RLE  strength and coordination as well as gait. Pt will benefit from PT services to address deficits in strength, balance, and mobility in order to return to full function at home.     Rehab Potential  Good    PT Frequency  2x / week    PT Duration  8 weeks    PT Treatment/Interventions  ADLs/Self Care Home Management;Aquatic Therapy;Cryotherapy;Electrical Stimulation;Iontophoresis 4mg /ml Dexamethasone;Moist Heat;Ultrasound;DME Instruction;Gait training;Stair training;Functional mobility training;Therapeutic activities;Therapeutic exercise;Balance training;Neuromuscular re-education;Cognitive remediation;Patient/family education;Orthotic Fit/Training;Manual techniques;Scar mobilization;Passive range of motion;Dry needling;Energy conservation;Splinting;Taping;Visual/perceptual remediation/compensation    PT Next Visit Plan  Berg Balance Test, weight shifting activities, standing HEP    PT Home Exercise Plan  marching in hooklying, supine hip abduction    Consulted and Agree with Plan of Care  Patient;Family member/caregiver   wife   Family Member Consulted  wife       Patient will benefit from skilled therapeutic intervention in order to improve the following deficits and impairments:  Abnormal gait, Decreased activity tolerance, Decreased balance, Decreased cognition, Decreased coordination, Decreased endurance, Decreased knowledge of precautions, Decreased knowledge of use of DME, Decreased mobility, Decreased range of motion, Decreased safety awareness, Decreased skin integrity, Decreased scar mobility, Decreased strength, Difficulty walking, Hypomobility, Hypermobility, Increased fascial restricitons, Increased muscle spasms, Impaired perceived functional ability, Impaired flexibility, Impaired sensation, Impaired tone, Impaired UE functional use, Impaired vision/preception, Improper body mechanics, Postural dysfunction, Pain  Visit Diagnosis: Muscle weakness (generalized)  Hemiplegia and hemiparesis  following cerebral infarction affecting right non-dominant side (HCC)  Problem List Patient Active Problem List   Diagnosis Date Noted  . History of cranioplasty 03/19/2018  . PE (pulmonary thromboembolism) (HCC) 10/06/2017  . DVT (deep vein thrombosis) in pregnancy 10/06/2017  . Acute blood loss anemia   . Vascular headache   . Spastic hemiplegia of right dominant side as late effect of cerebral infarction (HCC)   . Benign essential HTN   . Frontal lobe and executive function deficit following cerebral infarction   . Adjustment disorder with mixed anxiety and depressed mood   . 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 Aphasia due to acute stroke (HCC) 08/07/2017  . B12 deficiency   . Acute respiratory failure (HCC)   . Central line infiltration (HCC)   . Endotracheal tube present   . Fever   . Leukocytosis   . S/P craniotomy 07/30/2017  . Cerebral edema (HCC) 07/28/2017  . Hyperlipidemia 07/28/2017  . Stroke (cerebrum) (HCC) 07/27/2017    Lynnea Maizes PT, DPT, GCS  Jeremy [name],Jeremy [name] 04/22/2018, 11:28 AM  [facility] MAIN [facility] SERVICES [address] [place], [place], [address] Phone: [phone]   Fax:  [phone]  Name: Jeremy [name] MRN: [id number] Date of Birth: 1975-09-29

## 2018-04-22 NOTE — Therapy (Addendum)
[facility] MAIN [facility] SERVICES [address] [place], [place], [address] Phone: [phone]   Fax:  [phone]  Occupational Therapy Treatment  Patient Details  Name: Jeremy [name] MRN: [id number] Date of Birth: [birth year] No data recorded  Encounter Date: 04/22/2018  OT End of Session - 04/22/18 0944    Visit Number  2    Number of Visits  24    Date for OT Re-Evaluation  07/08/18    Authorization Type  [employer]    OT Start Time  [phone]    OT Stop Time  0930    OT Time Calculation (min)  38 min    Activity Tolerance  Patient tolerated treatment well    Behavior During Therapy  [facility] for tasks assessed/performed       Past Medical History:  Diagnosis Date  . Asthma    MILD AS A CHILD  . Deviated septum   . Dyspnea    NORMAL SPIROMETRY 02/01/17 VISIT WITH DR HEDRICK  . Hypertension   . Sleep apnea   . Stroke [facility])     Past Surgical History:  Procedure Laterality Date  . CRANIOPLASTY N/A 03/19/2018   Procedure: CRANIOPLASTY;  Surgeon: Lisbeth Renshaw, MD;  Location: [facility] OR;  Service: Neurosurgery;  Laterality: N/A;  CRANIOPLASTY  . CRANIOTOMY Left 07/29/2017   Procedure: LEFT HEMI- CRANIECTOMY WITH PLACEMENT OF BONE FLAP IN ABDOMEN;  Surgeon: Lisbeth Renshaw, MD;  Location: [facility] OR;  Service: Neurosurgery;  Laterality: Left;  . IR ANGIO INTRA EXTRACRAN SEL COM CAROTID INNOMINATE UNI R MOD SED  07/28/2017  . IR ANGIO VERTEBRAL SEL VERTEBRAL UNI R MOD SED  07/28/2017  . IR CT HEAD LTD  07/28/2017  . IR PERCUTANEOUS ART THROMBECTOMY/INFUSION INTRACRANIAL INC DIAG ANGIO  07/28/2017  . NASAL SEPTOPLASTY W/ TURBINOPLASTY Bilateral 03/06/2017   Procedure: NASAL SEPTOPLASTY WITH TURBINATE REDUCTION;  Surgeon: Linus Salmons, MD;  Location: [facility] ORS;  Service: ENT;  Laterality: Bilateral;  . RADIOLOGY WITH ANESTHESIA N/A 07/27/2017   Procedure: RADIOLOGY WITH ANESTHESIA;  Surgeon: Julieanne Cotton, MD;  Location: [facility] OR;  Service: Radiology;  Laterality:  N/A;    There were no vitals filed for this visit.  Subjective Assessment - 04/22/18 0942    Pertinent History  Pt is a 42 y/o M who presented on Jul 27, 2017 with R sided weakness, headache, slurred speech. Cranial CT scan showed L insula and frontal operculum lucency, compatible with infarction. Underwent attempted mechanical thrombectomy per Interventional Radiology that was unsuccessful, required intubation. Follow-up MRI showed cerebellar edema, midline shift. Underwent L decompressive hemicraniectomy, placement of bone flap in abdominal pocket Jan 13th, 2019. Pt went to CIR at [facility] until 2/21 and then received HHPT until end of April, followed by therapy at this clinic, then OPPT at [facility] once his insurance benefits ran out.  Patient now has new insurance and has returned to therapy for OT evaluation.      Patient Stated Goals  Patient would like to regain ability to take care of self.    Currently in Pain?  No/denies     OT TREATMENT   Neuromuscular re-education:  Pt tolerated scapular mobilization for scapular elevation/depression and protraction/retraction while seated on edge of mat table. Pt demonstrated increased tone with elbow flexion and extension. Pt completed weight bearing activity that required him to bear weight through RUE while reaching in various planes with the LUE. Facilitation of the scapula was provided with support distally during functional reaching.  Pt. worked on reaching with support, and alignment of the scapula.  Pt. demonstrated increased effort to reach lower planes.   There. Ex.:  Pt tolerated PROM of shoulder flexion, abduction, and horizontal abduction, elbow flexion and extension while lying supine. Pt. Attempted shoulder stabilization exercises in supine with the shoulder flexed at 90 degrees, with the elbow extended. Pt. completed PROM of R hand and digits using L hand. Pt. eductaion was provided about self-ROM for the elbow,  wrist, and digits with verbal cues, and tactile cues.     Manual Therapy:  Pt tolerated scapular mobilization for scapular elevation/depression and protraction/retraction while seated on edge of mat table to normalize tone, and prepare the RUE for ROM.                OT Education - 04/22/18 [phone]    Education provided  Yes    Education Details  POC, goals, Role of OT    Person(s) Educated  Patient    Methods  Explanation;Demonstration;Verbal cues    Comprehension  Verbalized understanding;Returned demonstration          OT Long Term Goals - 04/15/18 1918      OT LONG TERM GOAL #1   Title  Patient will demonstrate improved balance to perform clothing negotiation with dressing/toileting with supervision only.     Baseline  min assist    Time  12    Period  Weeks    Status  New    Target Date  07/08/18      OT LONG TERM GOAL #2   Title  Patient will demonstrate ability to manage shoulder brace with modified independence.    Baseline  unable at eval    Time  4    Period  Weeks    Status  New    Target Date  05/13/18      OT LONG TERM GOAL #3   Title  Patient will demonstrate trace movement of elbow extension with facilitation to work towards neuromuscular reeducation for active arm use on the right .    Baseline  no active movement at eval    Time  12    Period  Weeks    Status  New    Target Date  07/08/18      OT LONG TERM GOAL #4   Title  Patient will demonstate understanding of compensatory strategies to protect right UE from harm during engagement  in daily activities.     Baseline  sensation absent in right UE and at risk for injury.    Time  3    Period  Weeks    Status  New    Target Date  05/06/18      OT LONG TERM GOAL #5   Title  Patient will complete cutting meat one handed with modified independence with adaptive equipment as needed.     Baseline  unable at eval    Time  12    Period  Weeks    Status  New    Target Date  07/08/18       Long Term Additional Goals   Additional Long Term Goals  Yes      OT LONG TERM GOAL #6   Title  Patient will complete UB dressing with modified independence    Baseline  min assist     Time  12    Period  Weeks    Status  New    Target Date  07/08/18  OT LONG TERM GOAL #7   Title  Patient will complete shoe and sock donning with modified independence.    Baseline  unable at eval    Time  12    Period  Weeks    Status  New    Target Date  07/08/18      OT LONG TERM GOAL #8   Title  Patient will complete lower body dressing with occasional assist for buttons/zippers.     Baseline  min assist    Time  12    Period  Weeks    Status  New    Target Date  07/08/18            Plan - 04/22/18 0944    Clinical Impression Statement Pt. reports they have been travelling to [name] to see his wife's sister who is pregnant, and expecting soon. 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 Pt. continues to present with no active movement elicited in his RUE, and hand. Pt. was able to toleraE ROM, and proprioception to the RUE.      Occupational Profile and client history currently impacting functional performance Absent sensation in RUE from shoulder to digits, no active right UE movement, decreased right peripheral vision, has 43 month old infant at home, recent move from 3rd story apartment, unable to work, bone flap replaced in 9/19    Occupational performance deficits (Please refer to evaluation for details):  ADL's;IADL's;Leisure;Work    Electrical engineer    Current Impairments/barriers affecting progress:  positive:  motivation, family support.  Negative:  young age, lack of sensation on the right for light touch, sharp/dull, deep pressure, hot/cold, processing delay cognitively    OT Frequency  2x / week    OT Duration  12 weeks    OT Treatment/Interventions  Self-care/ADL training;Therapeutic exercise;Neuromuscular  education;Patient/family education;Building services engineer;Therapeutic activities;Balance training;DME and/or AE instruction;Manual Therapy;Passive range of motion    Clinical Decision Making  Several treatment options, min-mod task modification necessary    Consulted and Agree with Plan of Care  Patient;Family member/caregiver       Patient will benefit from skilled therapeutic intervention in order to improve the following deficits and impairments:  Abnormal gait, Decreased cognition, Decreased knowledge of use of DME, Increased edema, Pain, Decreased coordination, Decreased mobility, Impaired sensation, Decreased activity tolerance, Decreased endurance, Decreased range of motion, Decreased strength, Impaired tone, Decreased balance, Decreased knowledge of precautions, Decreased safety awareness, Difficulty walking, Impaired UE functional use  Visit Diagnosis: Muscle weakness (generalized)    Problem List Patient Active Problem List   Diagnosis Date Noted  . History of cranioplasty 03/19/2018  . PE (pulmonary thromboembolism) (HCC) 10/06/2017  . DVT (deep vein thrombosis) in pregnancy 10/06/2017  . Acute blood loss anemia   . Vascular headache   . Spastic hemiplegia of right dominant side as late effect of cerebral infarction (HCC)   . Benign essential HTN   . Frontal lobe and executive function deficit following cerebral infarction   . Adjustment disorder with mixed anxiety and depressed mood   . 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 Aphasia due to acute stroke (HCC) 08/07/2017  . B12 deficiency   . Acute respiratory failure (HCC)   . Central line infiltration (HCC)   . Endotracheal tube present   . Fever   . Leukocytosis   . S/P craniotomy 07/30/2017  . Cerebral edema (HCC) 07/28/2017  . Hyperlipidemia  07/28/2017  . Stroke (cerebrum) (HCC) 07/27/2017  Ernesto Rutherford, OTS  This entire session was performed under direct supervision and direction of a  licensed therapist/therapist assistant . I have personally read, edited and approve of the note as written.  Olegario Messier, MS, OTR/L 04/22/2018, 10:22 AM  [facility] MAIN [facility] SERVICES [address] [place], [place], [address] Phone: [phone]   Fax:  [phone]  Name: Lindy Pennisi MRN: [id number] Date of Birth: 1976-06-10

## 2018-04-23 ENCOUNTER — Encounter: Payer: BLUE CROSS/BLUE SHIELD | Admitting: Occupational Therapy

## 2018-04-24 ENCOUNTER — Ambulatory Visit: Payer: BLUE CROSS/BLUE SHIELD

## 2018-04-24 ENCOUNTER — Encounter: Payer: BLUE CROSS/BLUE SHIELD | Admitting: Occupational Therapy

## 2018-05-01 ENCOUNTER — Ambulatory Visit: Payer: BLUE CROSS/BLUE SHIELD | Admitting: Occupational Therapy

## 2018-05-01 ENCOUNTER — Ambulatory Visit: Payer: BLUE CROSS/BLUE SHIELD

## 2018-05-03 ENCOUNTER — Ambulatory Visit: Payer: BLUE CROSS/BLUE SHIELD

## 2018-05-03 ENCOUNTER — Ambulatory Visit: Payer: BLUE CROSS/BLUE SHIELD | Admitting: Occupational Therapy

## 2018-05-06 ENCOUNTER — Encounter: Payer: BLUE CROSS/BLUE SHIELD | Admitting: Occupational Therapy

## 2018-05-06 ENCOUNTER — Ambulatory Visit: Payer: BLUE CROSS/BLUE SHIELD

## 2018-05-08 ENCOUNTER — Ambulatory Visit: Payer: BLUE CROSS/BLUE SHIELD

## 2018-05-08 ENCOUNTER — Ambulatory Visit: Payer: BLUE CROSS/BLUE SHIELD | Admitting: Occupational Therapy

## 2018-05-13 ENCOUNTER — Ambulatory Visit: Payer: BLUE CROSS/BLUE SHIELD

## 2018-05-13 ENCOUNTER — Encounter: Payer: BLUE CROSS/BLUE SHIELD | Admitting: Occupational Therapy

## 2018-05-13 ENCOUNTER — Ambulatory Visit: Payer: BLUE CROSS/BLUE SHIELD | Admitting: Physical Therapy

## 2018-05-15 ENCOUNTER — Encounter: Payer: Self-pay | Admitting: Occupational Therapy

## 2018-05-15 ENCOUNTER — Encounter: Payer: Self-pay | Admitting: Physical Therapy

## 2018-05-15 ENCOUNTER — Ambulatory Visit: Payer: BLUE CROSS/BLUE SHIELD | Admitting: Occupational Therapy

## 2018-05-15 ENCOUNTER — Ambulatory Visit: Payer: BLUE CROSS/BLUE SHIELD

## 2018-05-15 ENCOUNTER — Ambulatory Visit: Payer: BLUE CROSS/BLUE SHIELD | Admitting: Physical Therapy

## 2018-05-15 DIAGNOSIS — M25511 Pain in right shoulder: Secondary | ICD-10-CM

## 2018-05-15 DIAGNOSIS — M6281 Muscle weakness (generalized): Secondary | ICD-10-CM

## 2018-05-15 DIAGNOSIS — I63512 Cerebral infarction due to unspecified occlusion or stenosis of left middle cerebral artery: Secondary | ICD-10-CM

## 2018-05-15 DIAGNOSIS — R278 Other lack of coordination: Secondary | ICD-10-CM

## 2018-05-15 DIAGNOSIS — R2689 Other abnormalities of gait and mobility: Secondary | ICD-10-CM

## 2018-05-15 DIAGNOSIS — I69353 Hemiplegia and hemiparesis following cerebral infarction affecting right non-dominant side: Secondary | ICD-10-CM

## 2018-05-15 NOTE — Therapy (Signed)
[facility] MAIN [facility] SERVICES [address] [place], [place], [address] Phone: [phone]   Fax:  [phone]  Physical Therapy Treatment  Patient Details  Name: Jeremy [name] MRN: [id number] Date of Birth: Oct 01, 1975 Referring Provider (PT): Dr. Claudette Laws   Encounter Date: 05/15/2018  PT End of Session - 05/15/18 1128    Visit Number  3    Number of Visits  17    Date for PT Re-Evaluation  06/10/18    Authorization Type  30 visits total for OT and PT combined     Authorization Time Period  3/15 PT visit    PT Start Time  1117    PT Stop Time  1145    PT Time Calculation (min)  28 min    Equipment Utilized During Treatment  Gait belt;Other (comment)   R AFO, R subluxation brace   Activity Tolerance  Patient tolerated treatment well    Behavior During Therapy  WFL for tasks assessed/performed       Past Medical History:  Diagnosis Date  . Asthma    MILD AS A CHILD  . Deviated septum   . Dyspnea    NORMAL SPIROMETRY 02/01/17 VISIT WITH DR HEDRICK  . Hypertension   . Sleep apnea   . Stroke [facility])     Past Surgical History:  Procedure Laterality Date  . CRANIOPLASTY N/A 03/19/2018   Procedure: CRANIOPLASTY;  Surgeon: Lisbeth Renshaw, MD;  Location: [facility] OR;  Service: Neurosurgery;  Laterality: N/A;  CRANIOPLASTY  . CRANIOTOMY Left 07/29/2017   Procedure: LEFT HEMI- CRANIECTOMY WITH PLACEMENT OF BONE FLAP IN ABDOMEN;  Surgeon: Lisbeth Renshaw, MD;  Location: [facility] OR;  Service: Neurosurgery;  Laterality: Left;  . IR ANGIO INTRA EXTRACRAN SEL COM CAROTID INNOMINATE UNI R MOD SED  07/28/2017  . IR ANGIO VERTEBRAL SEL VERTEBRAL UNI R MOD SED  07/28/2017  . IR CT HEAD LTD  07/28/2017  . IR PERCUTANEOUS ART THROMBECTOMY/INFUSION INTRACRANIAL INC DIAG ANGIO  07/28/2017  . NASAL SEPTOPLASTY W/ TURBINOPLASTY Bilateral 03/06/2017   Procedure: NASAL SEPTOPLASTY WITH TURBINATE REDUCTION;  Surgeon: Linus Salmons, MD;  Location: [facility] ORS;   Service: ENT;  Laterality: Bilateral;  . RADIOLOGY WITH ANESTHESIA N/A 07/27/2017   Procedure: RADIOLOGY WITH ANESTHESIA;  Surgeon: Julieanne Cotton, MD;  Location: [facility] OR;  Service: Radiology;  Laterality: N/A;    There were no vitals filed for this visit.  Subjective Assessment - 05/15/18 1127    Subjective  Pt reports he is doing well at this time. Denies any low back pain or spasm. No specific questions or concerns at this time.     Patient is accompained by:  Family member   wife and daughter   Pertinent History  Pt is a 42 y/o M who presented on Jul 27, 2017 with R sided weakness, headache, slurred speech. Cranial CT scan showed L insula and frontal operculum lucency, compatible with infarction. Underwent attempted mechanical thrombectomy per Interventional Radiology that was unsuccessful, required intubation. Follow-up MRI showed cerebellar edema, midline shift. Underwent L decompressive hemicraniectomy, placement of bone flap in abdominal pocket Jan 13th, 2019. Pt went to CIR at [facility] until 2/21 and then received HHPT until end of April, followed by therapy at this clinic, then OPP[facility] at [facility]. Pt now ambulating with quad cane at all times, limited primarily to in home ambulation. Uses riding cart at store. Pt is now able to go up/down curb on his own. Wife assists with  donning sock and shoe on R, shirt on the R. Wife assist bringing RLE into tub but pt shower independently. Pt requires occasional assist with RLE for supine<>sit. Pt's goals: reduce L low back spasms (believe to be due to weight shift to L), walking longer, to get back to work, to be able to cook a full meal, improve his balance, to be able to carry something while walking. Pt had Botox injections Aug 2019 in RLE and RUE. Plan is for repeat on Nov 1st. Pt with bone flap surgery ~1 month ago, pt denies precautions with this. Pt with h/o DVT in LLE and small PE in March 2019.    Limitations   Standing;Walking;House hold activities;Lifting    Patient Stated Goals  see above    Currently in Pain?  No/denies    Pain Score  0-No pain    Pain Onset  More than a month ago        Treatment: octane fitness x 5 mins  Supine SLR with assist x 10 x 2  hooklying hip marching x 10, assist for control  hooklying hip ER /abd x 10   Hip abd in supine with assist x 10   Trunk rotation left and right x 10   Sitting to side sitting on right UE with mod assist for push off x 5  Bridging x 10   Patient needs cues for correct technique and form.                       PT Education - 05/15/18 1127    Education provided  Yes    Education Details  safety, HEP    Person(s) Educated  Patient    Methods  Explanation    Comprehension  Returned demonstration;Need further instruction       PT Short Term Goals - 04/15/18 1206      PT SHORT TERM GOAL #1   Title  Pt will completed HEP at least 4 days/wk for improved carryover between sessions    Baseline  --    Time  2    Period  Weeks    Status  New        PT Long Term Goals - 04/15/18 1206      PT LONG TERM GOAL #1   Title  Pt's will improve to equal or greater than 0.6 m/s for improved safety with ambulation in the home setting    Baseline  0.22 m/s    Time  5    Period  Weeks    Status  New      PT LONG TERM GOAL #2   Title  Pt will be able to cook a full meal with min assist for improved QOL    Baseline  Pt able to perform simple meal prep (making sandwiches) but unable to cook full meal    Time  8    Period  Weeks    Status  New      PT LONG TERM GOAL #3   Title  Pt's will improve to at least 500 ft to demonstrate improved ambulatory endurance and speed    Baseline  210 ft    Time  8    Period  Weeks    Status  New      PT LONG TERM GOAL #4   Title  Pt will improve 5xSTS to equal or less than 15 seconds without UE support to demonstrate improved balance  and strength    Baseline   20.15 sec with LUE assist    Time  5    Period  Weeks    Status  New      PT LONG TERM GOAL #5   Title  Pt will be able to carry objects household distances for improved independence with homemaking and cooking activities.    Baseline  Carries small objects in L hand along with quad cane    Time  3    Period  Weeks    Status  New            Plan - 05/15/18 1129    Clinical Impression Statement  Patient arrives 15 mins late to appointment. Pt requires direction and verbal cues for correct performance of exercises. Patient demonstrates weakness in BLE and performs open and closed chain exercises with no reports of pain. Pt was able to perform all exercises with max assist and VC for technique. Patient struggles with control during movement .  Pt encouraged continuing HEP .Follow-up as scheduled    Rehab Potential  Good    PT Frequency  2x / week    PT Duration  8 weeks    PT Treatment/Interventions  ADLs/Self Care Home Management;Aquatic Therapy;Cryotherapy;Electrical Stimulation;Iontophoresis 4mg /ml Dexamethasone;Moist Heat;Ultrasound;DME Instruction;Gait training;Stair training;Functional mobility training;Therapeutic activities;Therapeutic exercise;Balance training;Neuromuscular re-education;Cognitive remediation;Patient/family education;Orthotic Fit/Training;Manual techniques;Scar mobilization;Passive range of motion;Dry needling;Energy conservation;Splinting;Taping;Visual/perceptual remediation/compensation    PT Next Visit Plan  Berg Balance Test, weight shifting activities, standing HEP    PT Home Exercise Plan  marching in hooklying, supine hip abduction    Consulted and Agree with Plan of Care  Patient;Family member/caregiver   wife   Family Member Consulted  wife       Patient will benefit from skilled therapeutic intervention in order to improve the following deficits and impairments:  Abnormal gait, Decreased activity tolerance, Decreased balance, Decreased cognition,  Decreased coordination, Decreased endurance, Decreased knowledge of precautions, Decreased knowledge of use of DME, Decreased mobility, Decreased range of motion, Decreased safety awareness, Decreased skin integrity, Decreased scar mobility, Decreased strength, Difficulty walking, Hypomobility, Hypermobility, Increased fascial restricitons, Increased muscle spasms, Impaired perceived functional ability, Impaired flexibility, Impaired sensation, Impaired tone, Impaired UE functional use, Impaired vision/preception, Improper body mechanics, Postural dysfunction, Pain  Visit Diagnosis: Muscle weakness (generalized)  Hemiplegia and hemiparesis following cerebral infarction affecting right non-dominant side (HCC)  Other abnormalities of gait and mobility  Other lack of coordination  Acute pain of right shoulder  Left middle cerebral artery stroke [facility])     Problem List Patient Active Problem List   Diagnosis Date Noted  . History of cranioplasty 03/19/2018  . PE (pulmonary thromboembolism) (HCC) 10/06/2017  . DVT (deep vein thrombosis) in pregnancy 10/06/2017  . Acute blood loss anemia   . Vascular headache   . Spastic hemiplegia of right dominant side as late effect of cerebral infarction (HCC)   . Benign essential HTN   . Frontal lobe and executive function deficit following cerebral infarction   . Adjustment disorder with mixed anxiety and depressed mood   . 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 Aphasia due to acute stroke (HCC) 08/07/2017  . B12 deficiency   . Acute respiratory failure (HCC)   . Central line infiltration (HCC)   . Endotracheal tube present   . Fever   . Leukocytosis   . S/P craniotomy 07/30/2017  . Cerebral edema (HCC) 07/28/2017  . Hyperlipidemia 07/28/2017  . Stroke (cerebrum) (HCC) 07/27/2017  Ezekiel Ina, [place] DPT 05/15/2018, 4:59 PM  [place] [facility] MAIN [facility] SERVICES [address]  [place], [place], [address] Phone: [phone]   Fax:  [phone]  Name: Baer Hinton MRN: [id number] Date of Birth: 1976/05/25

## 2018-05-15 NOTE — Therapy (Signed)
[place] [facility] MAIN [facility] SERVICES [address] [place], [place], [address] Phone: [phone]   Fax:  [phone]  Occupational Therapy Treatment  Patient Details  Name: Jeremy [name] MRN: [id number] Date of Birth: Oct 13, 1975 No data recorded  Encounter Date: 05/15/2018  OT End of Session - 05/15/18 1259    Visit Number  3    Number of Visits  24    Date for OT Re-Evaluation  07/08/18    Authorization Type  [employer]    Authorization Time Period  visit limit 30    OT Start Time  1145    OT Stop Time  1230    OT Time Calculation (min)  45 min    Activity Tolerance  Patient tolerated treatment well    Behavior During Therapy  [facility] for tasks assessed/performed       Past Medical History:  Diagnosis Date  . Asthma    MILD AS A CHILD  . Deviated septum   . Dyspnea    NORMAL SPIROMETRY 02/01/17 VISIT WITH DR HEDRICK  . Hypertension   . Sleep apnea   . Stroke [facility])     Past Surgical History:  Procedure Laterality Date  . CRANIOPLASTY N/A 03/19/2018   Procedure: CRANIOPLASTY;  Surgeon: Lisbeth Renshaw, MD;  Location: [facility] OR;  Service: Neurosurgery;  Laterality: N/A;  CRANIOPLASTY  . CRANIOTOMY Left 07/29/2017   Procedure: LEFT HEMI- CRANIECTOMY WITH PLACEMENT OF BONE FLAP IN ABDOMEN;  Surgeon: Lisbeth Renshaw, MD;  Location: [facility] OR;  Service: Neurosurgery;  Laterality: Left;  . IR ANGIO INTRA EXTRACRAN SEL COM CAROTID INNOMINATE UNI R MOD SED  07/28/2017  . IR ANGIO VERTEBRAL SEL VERTEBRAL UNI R MOD SED  07/28/2017  . IR CT HEAD LTD  07/28/2017  . IR PERCUTANEOUS ART THROMBECTOMY/INFUSION INTRACRANIAL INC DIAG ANGIO  07/28/2017  . NASAL SEPTOPLASTY W/ TURBINOPLASTY Bilateral 03/06/2017   Procedure: NASAL SEPTOPLASTY WITH TURBINATE REDUCTION;  Surgeon: Linus Salmons, MD;  Location: [facility] ORS;  Service: ENT;  Laterality: Bilateral;  . RADIOLOGY WITH ANESTHESIA N/A 07/27/2017   Procedure: RADIOLOGY WITH ANESTHESIA;  Surgeon: Julieanne Cotton, MD;   Location: [facility] OR;  Service: Radiology;  Laterality: N/A;    There were no vitals filed for this visit.  Subjective Assessment - 05/15/18 1258    Subjective   Pt. reports that his baby cut her first tooth.    Patient is accompained by:  Family member    Pertinent History  Pt is a 42 y/o M who presented on Jul 27, 2017 with R sided weakness, headache, slurred speech. Cranial CT scan showed L insula and frontal operculum lucency, compatible with infarction. Underwent attempted mechanical thrombectomy per Interventional Radiology that was unsuccessful, required intubation. Follow-up MRI showed cerebellar edema, midline shift. Underwent L decompressive hemicraniectomy, placement of bone flap in abdominal pocket Jan 13th, 2019. Pt went to CIR at [facility] until 2/21 and then received HHPT until end of April, followed by therapy at this clinic, then OPPT at [facility] once his insurance benefits ran out.  Patient now has new insurance and has returned to therapy for OT evaluation.      Patient Stated Goals  Patient would like to regain ability to take care of self.    Currently in Pain?  No/denies      OT TREATMENT    Neuro muscular re-education:  Pt. worked on weightbearing through the RUE, and hand with support and alignment of humeral head at the axilla while  reaching with his LUE.  Pt. also worked on forearm weightbearing in sidesitting to the right.   Therapeutic Exercise:  Pt. Tolerated PROM in all joint ranges of motion in the RUE, and handin supine, and sitting. Pt. Worked on hand to face/mouth patterns with maxA. Pt. worked on facilitating wrist extension with tapping at the wrist extensors, while alternating RUE weightbearing.  Manual Therapy:  Pt. Tolerated scapular mobilizations for scapular elevation, depression, abduction, and rotation to prepare the UE for ROM.                        OT Education - 05/15/18 1259    Education provided   Yes    Education Details  POC, goals, Role of OT    Person(s) Educated  Patient    Methods  Explanation;Demonstration;Verbal cues    Comprehension  Verbalized understanding;Returned demonstration          OT Long Term Goals - 04/15/18 1918      OT LONG TERM GOAL #1   Title  Patient will demonstrate improved balance to perform clothing negotiation with dressing/toileting with supervision only.     Baseline  min assist    Time  12    Period  Weeks    Status  New    Target Date  07/08/18      OT LONG TERM GOAL #2   Title  Patient will demonstrate ability to manage shoulder brace with modified independence.    Baseline  unable at eval    Time  4    Period  Weeks    Status  New    Target Date  05/13/18      OT LONG TERM GOAL #3   Title  Patient will demonstrate trace movement of elbow extension with facilitation to work towards neuromuscular reeducation for active arm use on the right .    Baseline  no active movement at eval    Time  12    Period  Weeks    Status  New    Target Date  07/08/18      OT LONG TERM GOAL #4   Title  Patient will demonstate understanding of compensatory strategies to protect right UE from harm during engagement  in daily activities.     Baseline  sensation absent in right UE and at risk for injury.    Time  3    Period  Weeks    Status  New    Target Date  05/06/18      OT LONG TERM GOAL #5   Title  Patient will complete cutting meat one handed with modified independence with adaptive equipment as needed.     Baseline  unable at eval    Time  12    Period  Weeks    Status  New    Target Date  07/08/18      Long Term Additional Goals   Additional Long Term Goals  Yes      OT LONG TERM GOAL #6   Title  Patient will complete UB dressing with modified independence    Baseline  min assist     Time  12    Period  Weeks    Status  New    Target Date  07/08/18      OT LONG TERM GOAL #7   Title  Patient will complete shoe and sock donning  with modified independence.    Baseline  unable  at eval    Time  12    Period  Weeks    Status  New    Target Date  07/08/18      OT LONG TERM GOAL #8   Title  Patient will complete lower body dressing with occasional assist for buttons/zippers.     Baseline  min assist    Time  12    Period  Weeks    Status  New    Target Date  07/08/18            Plan - 05/15/18 1259    Clinical Impression Statement Pt. returned from a visit to [name]. Pt. tolerated the drive to up, and the return flight. Pt. presents with fluctuating extensor tone through the right elbow, and increased flexor tone in the right hand, and digits. Pt. continues to present with no active movement elicited in the UE. Pt. is improving with ADL and self-dressing tasks. Pt. conitnues to be unable to to donn his shoe.    Occupational Profile and client history currently impacting functional performance  absent sensation in RUE from shoulder to digits, no active right UE movement, decreased right peripheral vision, has 64 month old infant at home, recent move from 3rd story apartment, unable to work, bone flap replaced in 9/19    Occupational performance deficits (Please refer to evaluation for details):  ADL's;IADL's;Leisure;Work    Rehab Potential  Fair    OT Frequency  2x / week    OT Duration  12 weeks    OT Treatment/Interventions  Self-care/ADL training;Therapeutic exercise;Neuromuscular education;Patient/family education;Building services engineer;Therapeutic activities;Balance training;DME and/or AE instruction;Manual Therapy;Passive range of motion    Consulted and Agree with Plan of Care  Patient;Family member/caregiver    Family Member Consulted  wife [name]       Patient will benefit from skilled therapeutic intervention in order to improve the following deficits and impairments:  Abnormal gait, Decreased cognition, Decreased knowledge of use of DME, Increased edema, Pain, Decreased coordination,  Decreased mobility, Impaired sensation, Decreased activity tolerance, Decreased endurance, Decreased range of motion, Decreased strength, Impaired tone, Decreased balance, Decreased knowledge of precautions, Decreased safety awareness, Difficulty walking, Impaired UE functional use  Visit Diagnosis: Muscle weakness (generalized)    Problem List Patient Active Problem List   Diagnosis Date Noted  . History of cranioplasty 03/19/2018  . PE (pulmonary thromboembolism) (HCC) 10/06/2017  . DVT (deep vein thrombosis) in pregnancy 10/06/2017  . Acute blood loss anemia   . Vascular headache   . Spastic hemiplegia of right dominant side as late effect of cerebral infarction (HCC)   . Benign essential HTN   . Frontal lobe and executive function deficit following cerebral infarction   . Adjustment disorder with mixed anxiety and depressed mood   . 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 Aphasia due to acute stroke (HCC) 08/07/2017  . B12 deficiency   . Acute respiratory failure (HCC)   . Central line infiltration (HCC)   . Endotracheal tube present   . Fever   . Leukocytosis   . S/P craniotomy 07/30/2017  . Cerebral edema (HCC) 07/28/2017  . Hyperlipidemia 07/28/2017  . Stroke (cerebrum) (HCC) 07/27/2017    Olegario Messier, MS, OTR/L 05/15/2018, 2:09 PM  [place] [facility] MAIN [facility] SERVICES [address] [place], [place], [address] Phone: [phone]   Fax:  [phone]  Name: Korbyn Chopin MRN: [id number] Date of Birth: [birth year]

## 2018-05-17 ENCOUNTER — Ambulatory Visit: Payer: BLUE CROSS/BLUE SHIELD | Admitting: Physical Medicine & Rehabilitation

## 2018-05-22 ENCOUNTER — Encounter: Payer: Self-pay | Admitting: Occupational Therapy

## 2018-05-22 ENCOUNTER — Ambulatory Visit: Payer: BLUE CROSS/BLUE SHIELD

## 2018-05-22 ENCOUNTER — Ambulatory Visit: Payer: BLUE CROSS/BLUE SHIELD | Attending: Physical Medicine & Rehabilitation | Admitting: Occupational Therapy

## 2018-05-22 DIAGNOSIS — R2689 Other abnormalities of gait and mobility: Secondary | ICD-10-CM | POA: Diagnosis present

## 2018-05-22 DIAGNOSIS — R278 Other lack of coordination: Secondary | ICD-10-CM | POA: Diagnosis present

## 2018-05-22 DIAGNOSIS — I69353 Hemiplegia and hemiparesis following cerebral infarction affecting right non-dominant side: Secondary | ICD-10-CM

## 2018-05-22 DIAGNOSIS — M6281 Muscle weakness (generalized): Secondary | ICD-10-CM | POA: Diagnosis not present

## 2018-05-22 NOTE — Therapy (Signed)
[facility] MAIN [facility] SERVICES [address] [place], [place], [address] Phone: [phone]   Fax:  [phone]  Physical Therapy Treatment  Patient Details  Name: Jeremy [name] MRN: [id number] Date of Birth: Jan 29, 1976 Referring Provider (PT): Dr. Claudette Laws   Encounter Date: 05/22/2018  PT End of Session - 05/22/18 1623    Visit Number  4    Number of Visits  17    Date for PT Re-Evaluation  06/10/18    Authorization Type  30 visits total for OT and PT combined     Authorization Time Period  4/15 PT visit    PT Start Time  1347    PT Stop Time  1430    PT Time Calculation (min)  43 min    Equipment Utilized During Treatment  Gait belt;Other (comment)   R AFO, R subluxation brace   Activity Tolerance  Patient tolerated treatment well    Behavior During Therapy  WFL for tasks assessed/performed       Past Medical History:  Diagnosis Date  . Asthma    MILD AS A CHILD  . Deviated septum   . Dyspnea    NORMAL SPIROMETRY 02/01/17 VISIT WITH DR HEDRICK  . Hypertension   . Sleep apnea   . Stroke [facility])     Past Surgical History:  Procedure Laterality Date  . CRANIOPLASTY N/A 03/19/2018   Procedure: CRANIOPLASTY;  Surgeon: Lisbeth Renshaw, MD;  Location: [facility] OR;  Service: Neurosurgery;  Laterality: N/A;  CRANIOPLASTY  . CRANIOTOMY Left 07/29/2017   Procedure: LEFT HEMI- CRANIECTOMY WITH PLACEMENT OF BONE FLAP IN ABDOMEN;  Surgeon: Lisbeth Renshaw, MD;  [facility] OR;  Service: Neurosurgery;  Laterality: Left;  . IR ANGIO INTRA EXTRACRAN SEL COM CAROTID INNOMINATE UNI R MOD SED  07/28/2017  . IR ANGIO VERTEBRAL SEL VERTEBRAL UNI R MOD SED  07/28/2017  . IR CT HEAD LTD  07/28/2017  . IR PERCUTANEOUS ART THROMBECTOMY/INFUSION INTRACRANIAL INC DIAG ANGIO  07/28/2017  . NASAL SEPTOPLASTY W/ TURBINOPLASTY Bilateral 03/06/2017   Procedure: NASAL SEPTOPLASTY WITH TURBINATE REDUCTION;  Surgeon: Linus Salmons, MD;  Location: [facility] ORS;   Service: ENT;  Laterality: Bilateral;  . RADIOLOGY WITH ANESTHESIA N/A 07/27/2017   Procedure: RADIOLOGY WITH ANESTHESIA;  Surgeon: Julieanne Cotton, MD;  Location: [facility] OR;  Service: Radiology;  Laterality: N/A;    There were no vitals filed for this visit.  Subjective Assessment - 05/22/18 1620    Subjective  Pt reports he is doing well at this time. No specific questions or concerns at this time. Denies pain.     Patient is accompained by:  Family member   wife and daughter   Pertinent History  Pt is a 42 y/o M who presented on Jul 27, 2017 with R sided weakness, headache, slurred speech. Cranial CT scan showed L insula and frontal operculum lucency, compatible with infarction. Underwent attempted mechanical thrombectomy per Interventional Radiology that was unsuccessful, required intubation. Follow-up MRI showed cerebellar edema, midline shift. Underwent L decompressive hemicraniectomy, placement of bone flap in abdominal pocket Jan 13th, 2019. Pt went to CIR at [facility] until 2/21 and then received HHPT until end of April, followed by therapy at this clinic, then OPPT at [facility]. Pt now ambulating with quad cane at all times, limited primarily to in home ambulation. Uses riding cart at store. Pt is now able to go up/down curb on his own. Wife assists with donning sock and shoe on  R, shirt on the R. Wife assist bringing RLE into tub but pt shower independently. Pt requires occasional assist with RLE for supine<>sit. Pt's goals: reduce L low back spasms (believe to be due to weight shift to L), walking longer, to get back to work, to be able to cook a full meal, improve his balance, to be able to carry something while walking. Pt had Botox injections Aug 2019 in RLE and RUE. Plan is for repeat on Nov 1st. Pt with bone flap surgery ~1 month ago, pt denies precautions with this. Pt with h/o DVT in LLE and small PE in March 2019.    Limitations  Standing;Walking;House hold  activities;Lifting    Patient Stated Goals  see above    Currently in Pain?  No/denies    Pain Onset  --        TREATMENT  Ther-ex  Octane fitness x 5 mins, therapist assist to prevent R hip abduction and intensity adjusted throughout; Sit to stand with LLE elevated and extended and therapist pushing weight to R side 2 x 10; Seated marches x 10 bilateral;  Seated clams with gentle manual resistance SAQ with NMES x 5 minutes with muscle tapping for quad activation Supine R single leg press on mat table with manual resistance using therapist bodyweight; Hooklying hip marching x 10, assist for control Hip abd in supine with assist x 10    Pt educated throughout session about proper posture and technique with exercises. Improved exercise technique, movement at target joints, use of target muscles after min to mod verbal, visual, tactile cues.   Patient demonstrates good motivation with exercises but does require some intermittent encouragement by therapist and wife. He demonstrates difficulty achieving a consistent quad contraction so NMES utilized for short arc quad contraction.  Pt encouraged continuing HEP and follow-up as scheduled. Pt will benefit from PT services to address deficits in strength, balance, and mobility in order to return to full function at home.                          PT Short Term Goals - 04/15/18 1206      PT SHORT TERM GOAL #1   Title  Pt will completed HEP at least 4 days/wk for improved carryover between sessions    Baseline  --    Time  2    Period  Weeks    Status  New        PT Long Term Goals - 04/15/18 1206      PT LONG TERM GOAL #1   Title  Pt's will improve to equal or greater than 0.6 m/s for improved safety with ambulation in the home setting    Baseline  0.22 m/s    Time  5    Period  Weeks    Status  New      PT LONG TERM GOAL #2   Title  Pt will be able to cook a full meal with min assist for improved  QOL    Baseline  Pt able to perform simple meal prep (making sandwiches) but unable to cook full meal    Time  8    Period  Weeks    Status  New      PT LONG TERM GOAL #3   Title  Pt's will improve to at least 500 ft to demonstrate improved ambulatory endurance and speed    Baseline  210 ft  Time  8    Period  Weeks    Status  New      PT LONG TERM GOAL #4   Title  Pt will improve 5xSTS to equal or less than 15 seconds without UE support to demonstrate improved balance and strength    Baseline  20.15 sec with LUE assist    Time  5    Period  Weeks    Status  New      PT LONG TERM GOAL #5   Title  Pt will be able to carry objects household distances for improved independence with homemaking and cooking activities.    Baseline  Carries small objects in L hand along with quad cane    Time  3    Period  Weeks    Status  New            Plan - 05/22/18 1624    Clinical Impression Statement  Patient demonstrates good motivation with exercises but does require some intermittent encouragement by therapist and wife. He demonstrates difficulty achieving a consistent quad contraction so NMES utilized for short arc quad contraction.  Pt encouraged continuing HEP and follow-up as scheduled. Pt will benefit from PT services to address deficits in strength, balance, and mobility in order to return to full function at home.    Rehab Potential  Good    PT Frequency  2x / week    PT Duration  8 weeks    PT Treatment/Interventions  ADLs/Self Care Home Management;Aquatic Therapy;Cryotherapy;Electrical Stimulation;Iontophoresis 4mg /ml Dexamethasone;Moist Heat;Ultrasound;DME Instruction;Gait training;Stair training;Functional mobility training;Therapeutic activities;Therapeutic exercise;Balance training;Neuromuscular re-education;Cognitive remediation;Patient/family education;Orthotic Fit/Training;Manual techniques;Scar mobilization;Passive range of motion;Dry needling;Energy  conservation;Splinting;Taping;Visual/perceptual remediation/compensation    PT Next Visit Plan  weight shifting activities, standing HEP    PT Home Exercise Plan  marching in hooklying, supine hip abduction    Consulted and Agree with Plan of Care  Patient;Family member/caregiver   wife   Family Member Consulted  wife       Patient will benefit from skilled therapeutic intervention in order to improve the following deficits and impairments:  Abnormal gait, Decreased activity tolerance, Decreased balance, Decreased cognition, Decreased coordination, Decreased endurance, Decreased knowledge of precautions, Decreased knowledge of use of DME, Decreased mobility, Decreased range of motion, Decreased safety awareness, Decreased skin integrity, Decreased scar mobility, Decreased strength, Difficulty walking, Hypomobility, Hypermobility, Increased fascial restricitons, Increased muscle spasms, Impaired perceived functional ability, Impaired flexibility, Impaired sensation, Impaired tone, Impaired UE functional use, Impaired vision/preception, Improper body mechanics, Postural dysfunction, Pain  Visit Diagnosis: Muscle weakness (generalized)  Hemiplegia and hemiparesis following cerebral infarction affecting right non-dominant side [facility])     Problem List Patient Active Problem List   Diagnosis Date Noted  . History of cranioplasty 03/19/2018  . PE (pulmonary thromboembolism) (HCC) 10/06/2017  . DVT (deep vein thrombosis) in pregnancy 10/06/2017  . Acute blood loss anemia   . Vascular headache   . Spastic hemiplegia of right dominant side as late effect of cerebral infarction (HCC)   . Benign essential HTN   . Frontal lobe and executive function deficit following cerebral infarction   . Adjustment disorder with mixed anxiety and depressed mood   . 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 Aphasia due to acute stroke (HCC) 08/07/2017  . B12 deficiency   . Acute respiratory failure  (HCC)   . Central line infiltration (HCC)   . Endotracheal tube present   . Fever   . Leukocytosis   . S/P craniotomy  07/30/2017  . Cerebral edema (HCC) 07/28/2017  . Hyperlipidemia 07/28/2017  . Stroke (cerebrum) (HCC) 07/27/2017   Lynnea Maizes PT, DPT, GCS  Huprich,Jason 05/22/2018, 4:30 PM  [place] [facility] MAIN [facility] SERVICES [address] [place], [place], [address] Phone: [phone]   Fax:  [phone]  Name: Jeremy [name] MRN: [id number] Date of Birth: 02-Oct-1975

## 2018-05-22 NOTE — Therapy (Addendum)
[facility] MAIN [facility] SERVICES [address] [place], [place], [address] Phone: [phone]   Fax:  [phone]  Occupational Therapy Treatment  Patient Details  Name: Jeremy [name] MRN: [id number] Date of Birth: April 12, 1976 No data recorded  Encounter Date: 05/22/2018  OT End of Session - 05/22/18 1450    Visit Number  4    Number of Visits  24    Date for OT Re-Evaluation  07/08/18    Authorization Type  [employer]    Authorization Time Period  visit limit 30    OT Start Time  1306    OT Stop Time  1345    OT Time Calculation (min)  39 min       Past Medical History:  Diagnosis Date  . Asthma    MILD AS A CHILD  . Deviated septum   . Dyspnea    NORMAL SPIROMETRY 02/01/17 VISIT WITH DR HEDRICK  . Hypertension   . Sleep apnea   . Stroke [facility])     Past Surgical History:  Procedure Laterality Date  . CRANIOPLASTY N/A 03/19/2018   Procedure: CRANIOPLASTY;  Surgeon: Lisbeth Renshaw, MD;  Location: [facility] OR;  Service: Neurosurgery;  Laterality: N/A;  CRANIOPLASTY  . CRANIOTOMY Left 07/29/2017   Procedure: LEFT HEMI- CRANIECTOMY WITH PLACEMENT OF BONE FLAP IN ABDOMEN;  Surgeon: Lisbeth Renshaw, MD;  Location: [facility] OR;  Service: Neurosurgery;  Laterality: Left;  . IR ANGIO INTRA EXTRACRAN SEL COM CAROTID INNOMINATE UNI R MOD SED  07/28/2017  . IR ANGIO VERTEBRAL SEL VERTEBRAL UNI R MOD SED  07/28/2017  . IR CT HEAD LTD  07/28/2017  . IR PERCUTANEOUS ART THROMBECTOMY/INFUSION INTRACRANIAL INC DIAG ANGIO  07/28/2017  . NASAL SEPTOPLASTY W/ TURBINOPLASTY Bilateral 03/06/2017   Procedure: NASAL SEPTOPLASTY WITH TURBINATE REDUCTION;  Surgeon: Linus Salmons, MD;  Location: [facility] ORS;  Service: ENT;  Laterality: Bilateral;  . RADIOLOGY WITH ANESTHESIA N/A 07/27/2017   Procedure: RADIOLOGY WITH ANESTHESIA;  Surgeon: Julieanne Cotton, MD;  [facility] OR;  Service: Radiology;  Laterality: N/A;    There were no vitals filed for this visit.  Subjective  Assessment - 05/22/18 1449    Subjective   Pt reports that he is doing well today.    Patient is accompained by:  Family member    Pertinent History  Pt is a 42 y/o M who presented on Jul 27, 2017 with R sided weakness, headache, slurred speech. Cranial CT scan showed L insula and frontal operculum lucency, compatible with infarction. Underwent attempted mechanical thrombectomy per Interventional Radiology that was unsuccessful, required intubation. Follow-up MRI showed cerebellar edema, midline shift. Underwent L decompressive hemicraniectomy, placement of bone flap in abdominal pocket Jan 13th, 2019. Pt went to CIR at [facility] until 2/21 and then received HHPT until end of April, followed by therapy at this clinic, then OPPT at [facility] once his insurance benefits ran out.  Patient now has new insurance and has returned to therapy for OT evaluation.      Patient Stated Goals  Patient would like to regain ability to take care of self.    Currently in Pain?  No/denies    Pain Score  0-No pain      OT TREATMENT   Neuro muscular re-education:  Pt. worked on weightbearing through the RUE, and hand with support and alignment of humeral head at the axilla while reaching with his LUE. Pt completed tasks that required him to roll large therapy ball  in a vertical pattern with both UE. Pt alternated between rolling the ball and weight bearing to manage tone.  Therapeutic Exercise:  Pt. tolerated PROM in all joint ranges of motion in the RUE, and hand in supine, and sitting. Pt presented with pain of 7/10 when ranged >90 degrees for shoulder flexion and a 5/10 when ranged >90 for shoulder abduction.   Manual Therapy:  Pt. tolerated scapular mobilizations for scapular elevation, depression, abduction, and rotation while sitting on the edge of the mat with a mirror for visual feedback for posture and form. Scapular mobilization completed to prepare right UE for  function.                     OT Education - 05/22/18 1449    Education provided  Yes    Education Details  RUE ROM, tone management    Person(s) Educated  Patient    Methods  Explanation;Demonstration;Verbal cues    Comprehension  Verbalized understanding;Returned demonstration          OT Long Term Goals - 04/15/18 1918      OT LONG TERM GOAL #1   Title  Patient will demonstrate improved balance to perform clothing negotiation with dressing/toileting with supervision only.     Baseline  min assist    Time  12    Period  Weeks    Status  New    Target Date  07/08/18      OT LONG TERM GOAL #2   Title  Patient will demonstrate ability to manage shoulder brace with modified independence.    Baseline  unable at eval    Time  4    Period  Weeks    Status  New    Target Date  05/13/18      OT LONG TERM GOAL #3   Title  Patient will demonstrate trace movement of elbow extension with facilitation to work towards neuromuscular reeducation for active arm use on the right .    Baseline  no active movement at eval    Time  12    Period  Weeks    Status  New    Target Date  07/08/18      OT LONG TERM GOAL #4   Title  Patient will demonstate understanding of compensatory strategies to protect right UE from harm during engagement  in daily activities.     Baseline  sensation absent in right UE and at risk for injury.    Time  3    Period  Weeks    Status  New    Target Date  05/06/18      OT LONG TERM GOAL #5   Title  Patient will complete cutting meat one handed with modified independence with adaptive equipment as needed.     Baseline  unable at eval    Time  12    Period  Weeks    Status  New    Target Date  07/08/18      Long Term Additional Goals   Additional Long Term Goals  Yes      OT LONG TERM GOAL #6   Title  Patient will complete UB dressing with modified independence    Baseline  min assist     Time  12    Period  Weeks    Status  New     Target Date  07/08/18      OT LONG TERM GOAL #7  Title  Patient will complete shoe and sock donning with modified independence.    Baseline  unable at eval    Time  12    Period  Weeks    Status  New    Target Date  07/08/18      OT LONG TERM GOAL #8   Title  Patient will complete lower body dressing with occasional assist for buttons/zippers.     Baseline  min assist    Time  12    Period  Weeks    Status  New    Target Date  07/08/18            Plan - 05/22/18 1450    Clinical Impression Statement  Pt presents to treatment today using wide based quad cane. Pt continues to present with extensor tone throughout the right elbow, and flexor tone in the right hand and digits. Pt presents with no active movement throughout the right UE. Pt doffed coat with min A during treatment today. Pt to continue to work on right UE function and strength to increase independence during ADLs and IADLs.    Occupational Profile and client history currently impacting functional performance  absent sensation in RUE from shoulder to digits, no active right UE movement, decreased right peripheral vision, has 38 month old infant at home, recent move from 3rd story apartment, unable to work, bone flap replaced in 9/19    Occupational performance deficits (Please refer to evaluation for details):  ADL's;IADL's;Leisure;Work    Electrical engineer    Current Impairments/barriers affecting progress:  positive:  motivation, family support.  Negative:  young age, lack of sensation on the right for light touch, sharp/dull, deep pressure, hot/cold, processing delay cognitively    OT Frequency  2x / week    OT Duration  12 weeks    OT Treatment/Interventions  Self-care/ADL training;Therapeutic exercise;Neuromuscular education;Patient/family education;Building services engineer;Therapeutic activities;Balance training;DME and/or AE instruction;Manual Therapy;Passive range of motion    Clinical Decision Making   Several treatment options, min-mod task modification necessary    Consulted and Agree with Plan of Care  Patient;Family member/caregiver    Family Member Consulted  wife [name]       Patient will benefit from skilled therapeutic intervention in order to improve the following deficits and impairments:  Abnormal gait, Decreased cognition, Decreased knowledge of use of DME, Increased edema, Pain, Decreased coordination, Decreased mobility, Impaired sensation, Decreased activity tolerance, Decreased endurance, Decreased range of motion, Decreased strength, Impaired tone, Decreased balance, Decreased knowledge of precautions, Decreased safety awareness, Difficulty walking, Impaired UE functional use  Visit Diagnosis: Muscle weakness (generalized)    Problem List Patient Active Problem List   Diagnosis Date Noted  . History of cranioplasty 03/19/2018  . PE (pulmonary thromboembolism) (HCC) 10/06/2017  . DVT (deep vein thrombosis) in pregnancy 10/06/2017  . Acute blood loss anemia   . Vascular headache   . Spastic hemiplegia of right dominant side as late effect of cerebral infarction (HCC)   . Benign essential HTN   . Frontal lobe and executive function deficit following cerebral infarction   . Adjustment disorder with mixed anxiety and depressed mood   . 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 Aphasia due to acute stroke (HCC) 08/07/2017  . B12 deficiency   . Acute respiratory failure (HCC)   . Central line infiltration (HCC)   . Endotracheal tube present   . Fever   . Leukocytosis   . S/P craniotomy 07/30/2017  .  Cerebral edema (HCC) 07/28/2017  . Hyperlipidemia 07/28/2017  . Stroke (cerebrum) (HCC) 07/27/2017    Ernesto Rutherford, OTS 05/22/2018, 2:56 PM   This entire session was performed under direct supervision and direction of a licensed therapist/therapist assistant . I have personally read, edited and approve of the note as written.  Olegario Messier, MS,  OTR/L   [place] [facility] MAIN [facility] SERVICES [address] [place], [place], [address] Phone: [phone]   Fax:  [phone]  Name: Kortez Murtagh MRN: [id number] Date of Birth: [birth year]

## 2018-05-23 ENCOUNTER — Encounter: Payer: BLUE CROSS/BLUE SHIELD | Attending: Registered Nurse

## 2018-05-23 ENCOUNTER — Ambulatory Visit (HOSPITAL_BASED_OUTPATIENT_CLINIC_OR_DEPARTMENT_OTHER): Payer: BLUE CROSS/BLUE SHIELD | Admitting: Physical Medicine & Rehabilitation

## 2018-05-23 ENCOUNTER — Encounter: Payer: Self-pay | Admitting: Physical Medicine & Rehabilitation

## 2018-05-23 VITALS — BP 127/87 | HR 71 | Ht 71.0 in | Wt 195.0 lb

## 2018-05-23 DIAGNOSIS — I10 Essential (primary) hypertension: Secondary | ICD-10-CM | POA: Diagnosis not present

## 2018-05-23 DIAGNOSIS — G4730 Sleep apnea, unspecified: Secondary | ICD-10-CM | POA: Diagnosis not present

## 2018-05-23 DIAGNOSIS — I69319 Unspecified symptoms and signs involving cognitive functions following cerebral infarction: Secondary | ICD-10-CM | POA: Diagnosis not present

## 2018-05-23 DIAGNOSIS — I69351 Hemiplegia and hemiparesis following cerebral infarction affecting right dominant side: Secondary | ICD-10-CM | POA: Insufficient documentation

## 2018-05-23 DIAGNOSIS — Z8249 Family history of ischemic heart disease and other diseases of the circulatory system: Secondary | ICD-10-CM | POA: Insufficient documentation

## 2018-05-23 NOTE — Patient Instructions (Addendum)
You received a Botox injection today. You may experience soreness at the needle injection sites. Please call us if any of the injection sites turns red after a couple days or if there is any drainage. You may experience muscle weakness as a result of Botox. This would improve with time but can take several weeks to improve. The Botox should start working in about one week. The Botox usually last 3 months. The injection can be repeated every 3 months as needed.   Please bring new brace to next visit

## 2018-05-23 NOTE — Progress Notes (Signed)
Botox Injection for spasticity using needle EMG guidance for UE and Ultrasound guidance for lower ext  Dilution: 50 Units/ml Indication: Severe spasticity which interferes with ADL,mobility and/or  hygiene and is unresponsive to medication management and other conservative care Informed consent was obtained after describing risks and benefits of the procedure with the patient. This includes bleeding, bruising, infection, excessive weakness, or medication side effects. A REMS form is on file and signed. Needle: 25G needle electrode Number of units per muscle FPL 25 U FDS 25 U   Right posterior tibialis 75 units Right medial gastroc 75 units Right lateral gastroc 75 units Right medial soleus 75 units Right lateral soleus 50 units All injections were done after obtaining appropriate EMG activity and after negative drawback for blood. The patient tolerated the procedure well. Post procedure instructions were given. A followup appointment was made.

## 2018-05-29 ENCOUNTER — Ambulatory Visit: Payer: BLUE CROSS/BLUE SHIELD | Admitting: Occupational Therapy

## 2018-05-29 ENCOUNTER — Ambulatory Visit: Payer: BLUE CROSS/BLUE SHIELD

## 2018-05-29 ENCOUNTER — Encounter: Payer: Self-pay | Admitting: Occupational Therapy

## 2018-05-29 DIAGNOSIS — M6281 Muscle weakness (generalized): Secondary | ICD-10-CM

## 2018-05-29 DIAGNOSIS — I69353 Hemiplegia and hemiparesis following cerebral infarction affecting right non-dominant side: Secondary | ICD-10-CM

## 2018-05-29 DIAGNOSIS — R278 Other lack of coordination: Secondary | ICD-10-CM

## 2018-05-29 NOTE — Therapy (Signed)
[facility] [address] [place], [place], [address] Phone: [phone]   Fax:  [phone]  Physical Therapy Treatment  Patient Details  Name: Jeremy [name] MRN: [id number] Date of Birth: 28-Jul-1975 Referring Provider (PT): Dr. Claudette LawsAndrew Kirsteins   Encounter Date: 05/29/2018  PT End of Session - 05/29/18 1124    Visit Number  5    Number of Visits  17    Date for PT Re-Evaluation  06/10/18    Authorization Type  30 visits total for OT and PT combined     Authorization Time Period  5/15 PT visit    PT Start Time  1125    PT Stop Time  1145    PT Time Calculation (min)  20 min    Equipment Utilized During Treatment  Gait belt;Other (comment)   R AFO, R subluxation brace   Activity Tolerance  Patient tolerated treatment well    Behavior During Therapy  WFL for tasks assessed/performed       Past Medical History:  Diagnosis Date  . Asthma    MILD AS A CHILD  . Deviated septum   . Dyspnea    NORMAL SPIROMETRY 02/01/17 VISIT WITH DR HEDRICK  . Hypertension   . Sleep apnea   . Stroke [facility](HCC)     Past Surgical History:  Procedure Laterality Date  . CRANIOPLASTY N/A 03/19/2018   Procedure: CRANIOPLASTY;  Surgeon: Lisbeth RenshawNundkumar, Neelesh, MD;  [facility];  Service: Neurosurgery;  Laterality: N/A;  CRANIOPLASTY  . CRANIOTOMY Left 07/29/2017   Procedure: LEFT HEMI- CRANIECTOMY WITH PLACEMENT OF BONE FLAP IN ABDOMEN;  Surgeon: Lisbeth RenshawNundkumar, Neelesh, MD;  Location: [facility] OR;  Service: Neurosurgery;  Laterality: Left;  . IR ANGIO INTRA EXTRACRAN SEL COM CAROTID INNOMINATE UNI R MOD SED  07/28/2017  . IR ANGIO VERTEBRAL SEL VERTEBRAL UNI R MOD SED  07/28/2017  . IR CT HEAD LTD  07/28/2017  . IR PERCUTANEOUS ART THROMBECTOMY/INFUSION INTRACRANIAL INC DIAG ANGIO  07/28/2017  . NASAL SEPTOPLASTY W/ TURBINOPLASTY Bilateral 03/06/2017   Procedure: NASAL SEPTOPLASTY WITH TURBINATE REDUCTION;  Surgeon: Linus SalmonsMcQueen, Chapman, MD;  Location: [facility] ORS;   Service: ENT;  Laterality: Bilateral;  . RADIOLOGY WITH ANESTHESIA N/A 07/27/2017   Procedure: RADIOLOGY WITH ANESTHESIA;  Surgeon: Julieanne Cottoneveshwar, Sanjeev, MD;  Location: [facility] OR;  Service: Radiology;  Laterality: N/A;    There were no vitals filed for this visit.  Subjective Assessment - 05/29/18 1155    Subjective  Pt reports he is doing well at this time. No specific questions or concerns at this time. Denies pain. He went to see physiatry since his last PT appointment and received botox injections but has not noticed much difference.    Patient is accompained by:  Family member   wife and daughter   Pertinent History  Pt is a 42 y/o M who presented on Jul 27, 2017 with R sided weakness, headache, slurred speech. Cranial CT scan showed L insula and frontal operculum lucency, compatible with infarction. Underwent attempted mechanical thrombectomy per Interventional Radiology that was unsuccessful, required intubation. Follow-up MRI showed cerebellar edema, midline shift. Underwent L decompressive hemicraniectomy, placement of bone flap in abdominal pocket Jan 13th, 2019. [facility] and then received HHPT until end of April, followed by therapy at this clinic, then [facility]. Pt now ambulating with quad cane at all times, limited primarily to in home ambulation. Uses riding cart at store.  Pt is now able to go up/down curb on his own. Wife assists with donning sock and shoe on R, shirt on the R. Wife assist bringing RLE into tub but pt shower independently. Pt requires occasional assist with RLE for supine<>sit. Pt's goals: reduce L low back spasms (believe to be due to weight shift to L), walking longer, to get back to work, to be able to cook a full meal, improve his balance, to be able to carry something while walking. Pt had Botox injections Aug 2019 in RLE and RUE. Plan is for repeat on Nov 1st. Pt with bone flap surgery ~1 month ago, pt denies  precautions with this. Pt with h/o DVT in LLE and small PE in March 2019.    Limitations  Standing;Walking;House hold activities;Lifting    Patient Stated Goals  see above    Currently in Pain?  No/denies           TREATMENT  Ther-ex  Sit to stand with LLE elevated and extended and therapist pushing weight to R side from regular height chair with Airex pad on seated 2 x 10; Seated marches x 10 bilateral;  Seated clams with gentle manual resistance Seated adductor squeeze with gentle manual resistance 2 x 15; Seated R LAQ with manual assistance x 15; Seated R HS curls with manual resistance x 15; Standing marches x 15 bilateral with assist on RLE to prevent knee hyperextension when in single leg stance; Standing weight shifts with RLE in the back and attempted toe lifts to attempt standing quad set x 10; Quantum R single leg press 15# with extensive cues by therapist as well as assist to prevent R hip abduction/ER as well as assist to control eccentric lowering of weight;   Pt educated throughout session about proper posture and technique with exercises. Improved exercise technique, movement at target joints, use of target muscles after min to mod verbal, visual, tactile cues.   Patient arrived late for appointment which limited his time during session. He demonstrates good motivation with exercises but does require some intermittent encouragement by therapist for redirection. Attempted to use standing weight shifts to encourage quad contraction and this is partially successful. Also utilized R single leg press but this requires extensive cues by therapist as well as assist to prevent R hip abduction/ER and controlled eccentric lowering of weight. Pt will benefit from PT services to address deficits in strength, balance, and mobility in order to return to full function at home.                       PT Short Term Goals - 04/15/18 1206      PT SHORT TERM GOAL #1    Title  Pt will completed HEP at least 4 days/wk for improved carryover between sessions    Baseline  --    Time  2    Period  Weeks    Status  New        PT Long Term Goals - 04/15/18 1206      PT LONG TERM GOAL #1   Title  Pt's will improve to equal or greater than 0.6 m/s for improved safety with ambulation in the home setting    Baseline  0.22 m/s    Time  5    Period  Weeks    Status  New      PT LONG TERM GOAL #2   Title  Pt will be able to cook  a full meal with min assist for improved QOL    Baseline  Pt able to perform simple meal prep (making sandwiches) but unable to cook full meal    Time  8    Period  Weeks    Status  New      PT LONG TERM GOAL #3   Title  Pt's will improve to at least 500 ft to demonstrate improved ambulatory endurance and speed    Baseline  210 ft    Time  8    Period  Weeks    Status  New      PT LONG TERM GOAL #4   Title  Pt will improve 5xSTS to equal or less than 15 seconds without UE support to demonstrate improved balance and strength    Baseline  20.15 sec with LUE assist    Time  5    Period  Weeks    Status  New      PT LONG TERM GOAL #5   Title  Pt will be able to carry objects household distances for improved independence with homemaking and cooking activities.    Baseline  Carries small objects in L hand along with quad cane    Time  3    Period  Weeks    Status  New            Plan - 05/29/18 1156    Clinical Impression Statement  Patient arrived late for appointment which limited his time during session. He demonstrates good motivation with exercises but does require some intermittent encouragement by therapist for redirection. Attempted to use standing weight shifts to encourage quad contraction and this is partially successful. Also utilized R single leg press but this requires extensive cues by therapist as well as assist to prevent R hip abduction/ER and controlled eccentric lowering of weight. Pt will  benefit from PT services to address deficits in strength, balance, and mobility in order to return to full function at home.     Rehab Potential  Good    PT Frequency  2x / week    PT Duration  8 weeks    PT Treatment/Interventions  ADLs/Self Care Home Management;Aquatic Therapy;Cryotherapy;Electrical Stimulation;Iontophoresis 4mg /ml Dexamethasone;Moist Heat;Ultrasound;DME Instruction;Gait training;Stair training;Functional mobility training;Therapeutic activities;Therapeutic exercise;Balance training;Neuromuscular re-education;Cognitive remediation;Patient/family education;Orthotic Fit/Training;Manual techniques;Scar mobilization;Passive range of motion;Dry needling;Energy conservation;Splinting;Taping;Visual/perceptual remediation/compensation    PT Next Visit Plan  weight shifting activities, standing HEP    PT Home Exercise Plan  marching in hooklying, supine hip abduction    Consulted and Agree with Plan of Care  Patient;Family member/caregiver   wife   Family Member Consulted  wife       Patient will benefit from skilled therapeutic intervention in order to improve the following deficits and impairments:  Abnormal gait, Decreased activity tolerance, Decreased balance, Decreased cognition, Decreased coordination, Decreased endurance, Decreased knowledge of precautions, Decreased knowledge of use of DME, Decreased mobility, Decreased range of motion, Decreased safety awareness, Decreased skin integrity, Decreased scar mobility, Decreased strength, Difficulty walking, Hypomobility, Hypermobility, Increased fascial restricitons, Increased muscle spasms, Impaired perceived functional ability, Impaired flexibility, Impaired sensation, Impaired tone, Impaired UE functional use, Impaired vision/preception, Improper body mechanics, Postural dysfunction, Pain  Visit Diagnosis: Muscle weakness (generalized)  Hemiplegia and hemiparesis following cerebral infarction affecting right non-dominant side  [facility])     Problem List Patient Active Problem List   Diagnosis Date Noted  . History of cranioplasty 03/19/2018  . PE (pulmonary thromboembolism) (HCC) 10/06/2017  . DVT (deep  vein thrombosis) in pregnancy 10/06/2017  . Acute blood loss anemia   . Vascular headache   . Spastic hemiplegia of right dominant side as late effect of cerebral infarction (HCC)   . Benign essential HTN   . Frontal lobe and executive function deficit following cerebral infarction   . Adjustment disorder with mixed anxiety and depressed mood   . 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 Aphasia due to acute stroke (HCC) 08/07/2017  . B12 deficiency   . Acute respiratory failure (HCC)   . Central line infiltration (HCC)   . Endotracheal tube present   . Fever   . Leukocytosis   . S/P craniotomy 07/30/2017  . Cerebral edema (HCC) 07/28/2017  . Hyperlipidemia 07/28/2017  . Stroke (cerebrum) (HCC) 07/27/2017    Lynnea Maizes PT, DPT, GCS  Selah Zelman 05/29/2018, 11:57 AM  [facility] MAIN [facility] SERVICES [address] [place], [place], [address] Phone: [phone]   Fax:  [phone]  Name: Jeremy [name] MRN: [id number] Date of Birth: 1975/10/24

## 2018-05-29 NOTE — Therapy (Signed)
[facility] MAIN [facility] SERVICES [address] [place], [place], [address] Phone: [phone]   Fax:  [phone]  Occupational Therapy Treatment  Patient Details  Name: Jeremy [name] MRN: [id number] Date of Birth: 12/02/1975 No data recorded  Encounter Date: 05/29/2018  OT End of Session - 05/29/18 1403    Visit Number  5    Number of Visits  24    Date for OT Re-Evaluation  07/08/18    Authorization Type  [employer]    Authorization Time Period  visit limit 30    OT Start Time  1145    OT Stop Time  1230    OT Time Calculation (min)  45 min    Activity Tolerance  Patient tolerated treatment well    Behavior During Therapy  [facility] for tasks assessed/performed       Past Medical History:  Diagnosis Date  . Asthma    MILD AS A CHILD  . Deviated septum   . Dyspnea    NORMAL SPIROMETRY 02/01/17 VISIT WITH DR HEDRICK  . Hypertension   . Sleep apnea   . Stroke [facility])     Past Surgical History:  Procedure Laterality Date  . CRANIOPLASTY N/A 03/19/2018   Procedure: CRANIOPLASTY;  Surgeon: Lisbeth Renshaw, MD;  Location: [facility] OR;  Service: Neurosurgery;  Laterality: N/A;  CRANIOPLASTY  . CRANIOTOMY Left 07/29/2017   Procedure: LEFT HEMI- CRANIECTOMY WITH PLACEMENT OF BONE FLAP IN ABDOMEN;  Surgeon: Lisbeth Renshaw, MD;  Location: [facility] OR;  Service: Neurosurgery;  Laterality: Left;  . IR ANGIO INTRA EXTRACRAN SEL COM CAROTID INNOMINATE UNI R MOD SED  07/28/2017  . IR ANGIO VERTEBRAL SEL VERTEBRAL UNI R MOD SED  07/28/2017  . IR CT HEAD LTD  07/28/2017  . IR PERCUTANEOUS ART THROMBECTOMY/INFUSION INTRACRANIAL INC DIAG ANGIO  07/28/2017  . NASAL SEPTOPLASTY W/ TURBINOPLASTY Bilateral 03/06/2017   Procedure: NASAL SEPTOPLASTY WITH TURBINATE REDUCTION;  Surgeon: Linus Salmons, MD;  Location: [facility] ORS;  Service: ENT;  Laterality: Bilateral;  . RADIOLOGY WITH ANESTHESIA N/A 07/27/2017   Procedure: RADIOLOGY WITH ANESTHESIA;  Surgeon: Julieanne Cotton, MD;   Location: [facility] OR;  Service: Radiology;  Laterality: N/A;    There were no vitals filed for this visit.  Subjective Assessment - 05/29/18 1402    Subjective   Pt. reports that his infant daugther started talking.    Patient is accompained by:  Family member    Pertinent History  Pt is a 42 y/o M who presented on Jul 27, 2017 with R sided weakness, headache, slurred speech. Cranial CT scan showed L insula and frontal operculum lucency, compatible with infarction. Underwent attempted mechanical thrombectomy per Interventional Radiology that was unsuccessful, required intubation. Follow-up MRI showed cerebellar edema, midline shift. Underwent L decompressive hemicraniectomy, placement of bone flap in abdominal pocket Jan 13th, 2019. Pt went to CIR at [facility] until 2/21 and then received HHPT until end of April, followed by therapy at this clinic, then OPPT at [facility] once his insurance benefits ran out.  Patient now has new insurance and has returned to therapy for OT evaluation.      Patient Stated Goals  Patient would like to regain ability to take care of self.    Currently in Pain?  No/denies      OT TREATMENT    Neuro muscular re-education:  Pt worked on weightbearing, and proprioceptive awareness through his RUE, and hand to normalize tone, and prepare the RUE for ROM,  and faciitation of functional hand use. Pt. worked on weightbearing through multiple closed chain planes. Support was provided at the axilla for the head of the humerus. Pt. required assist with preparing the wrist, and digits for extension, and weightbearing at the mat.   Therapeutic Exercise:  Pt. tolerated PROM in all joint ranges of the RUE, and hand in supine. No active ROM.  Manual Therapy:  Pt. tolerated scapular mobilizations in elevation, depression, and rotation to normalize tone, and prepare the RUE for ROM.                      OT Education - 05/29/18 1403    Education  provided  Yes    Education Details  RUE ROM, tone management    Person(s) Educated  Patient    Methods  Explanation;Demonstration;Verbal cues    Comprehension  Verbalized understanding;Returned demonstration          OT Long Term Goals - 04/15/18 1918      OT LONG TERM GOAL #1   Title  Patient will demonstrate improved balance to perform clothing negotiation with dressing/toileting with supervision only.     Baseline  min assist    Time  12    Period  Weeks    Status  New    Target Date  07/08/18      OT LONG TERM GOAL #2   Title  Patient will demonstrate ability to manage shoulder brace with modified independence.    Baseline  unable at eval    Time  4    Period  Weeks    Status  New    Target Date  05/13/18      OT LONG TERM GOAL #3   Title  Patient will demonstrate trace movement of elbow extension with facilitation to work towards neuromuscular reeducation for active arm use on the right .    Baseline  no active movement at eval    Time  12    Period  Weeks    Status  New    Target Date  07/08/18      OT LONG TERM GOAL #4   Title  Patient will demonstate understanding of compensatory strategies to protect right UE from harm during engagement  in daily activities.     Baseline  sensation absent in right UE and at risk for injury.    Time  3    Period  Weeks    Status  New    Target Date  05/06/18      OT LONG TERM GOAL #5   Title  Patient will complete cutting meat one handed with modified independence with adaptive equipment as needed.     Baseline  unable at eval    Time  12    Period  Weeks    Status  New    Target Date  07/08/18      Long Term Additional Goals   Additional Long Term Goals  Yes      OT LONG TERM GOAL #6   Title  Patient will complete UB dressing with modified independence    Baseline  min assist     Time  12    Period  Weeks    Status  New    Target Date  07/08/18      OT LONG TERM GOAL #7   Title  Patient will complete shoe and  sock donning with modified independence.    Baseline  unable  at eval    Time  12    Period  Weeks    Status  New    Target Date  07/08/18      OT LONG TERM GOAL #8   Title  Patient will complete lower body dressing with occasional assist for buttons/zippers.     Baseline  min assist    Time  12    Period  Weeks    Status  New    Target Date  07/08/18            Plan - 05/29/18 1403    Clinical Impression Statement Pt. reports that he went to the mountains this past weekend with several other couples, and their families. Pt. continues to present with impaired RUE ROM, subluxation, fluctuating tone, with increased extensor tone in the elbow extensors. Pt. continues to work on normalizing tone in the RUE, and preparing the UE for ROM, and facilitation of functional hand use.       Occupational Profile and client history currently impacting functional performance  absent sensation in RUE from shoulder to digits, no active right UE movement, decreased right peripheral vision, has 44 month old infant at home, recent move from 3rd story apartment, unable to work, bone flap replaced in 9/19    Occupational performance deficits (Please refer to evaluation for details):  ADL's;IADL's;Leisure;Work    Electrical engineer    Current Impairments/barriers affecting progress:  positive:  motivation, family support.  Negative:  young age, lack of sensation on the right for light touch, sharp/dull, deep pressure, hot/cold, processing delay cognitively    OT Frequency  2x / week    OT Duration  12 weeks    OT Treatment/Interventions  Self-care/ADL training;Therapeutic exercise;Neuromuscular education;Patient/family education;Building services engineer;Therapeutic activities;Balance training;DME and/or AE instruction;Manual Therapy;Passive range of motion    Clinical Decision Making  Several treatment options, min-mod task modification necessary    Consulted and Agree with Plan of Care  Patient;Family  member/caregiver    Family Member Consulted  wife [name]       Patient will benefit from skilled therapeutic intervention in order to improve the following deficits and impairments:  Abnormal gait, Decreased cognition, Decreased knowledge of use of DME, Increased edema, Pain, Decreased coordination, Decreased mobility, Impaired sensation, Decreased activity tolerance, Decreased endurance, Decreased range of motion, Decreased strength, Impaired tone, Decreased balance, Decreased knowledge of precautions, Decreased safety awareness, Difficulty walking, Impaired UE functional use  Visit Diagnosis: Muscle weakness (generalized)  Other lack of coordination    Problem List Patient Active Problem List   Diagnosis Date Noted  . History of cranioplasty 03/19/2018  . PE (pulmonary thromboembolism) (HCC) 10/06/2017  . DVT (deep vein thrombosis) in pregnancy 10/06/2017  . Acute blood loss anemia   . Vascular headache   . Spastic hemiplegia of right dominant side as late effect of cerebral infarction (HCC)   . Benign essential HTN   . Frontal lobe and executive function deficit following cerebral infarction   . Adjustment disorder with mixed anxiety and depressed mood   . 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 Aphasia due to acute stroke (HCC) 08/07/2017  . B12 deficiency   . Acute respiratory failure (HCC)   . Central line infiltration (HCC)   . Endotracheal tube present   . Fever   . Leukocytosis   . S/P craniotomy 07/30/2017  . Cerebral edema (HCC) 07/28/2017  . Hyperlipidemia 07/28/2017  . Stroke (cerebrum) (HCC) 07/27/2017    Consuella Lose  Malone Vanblarcom, MS, OTR/L 05/29/2018, 2:20 PM   This entire session was performed under direct supervision and direction of a licensed therapist/therapist assistant . I have personally read, edited and approve of the note as written.  Olegario MessierElaine Miley Lindon, MS, OTR/L   [facility] [address] [place], [place], [address] Phone: [phone]   Fax:  [phone]  Name: Cletus GashJao Beilfuss MRN: [id number] Date of Birth: 1975-10-10

## 2018-06-05 ENCOUNTER — Encounter: Payer: Self-pay | Admitting: Occupational Therapy

## 2018-06-05 ENCOUNTER — Ambulatory Visit: Payer: BLUE CROSS/BLUE SHIELD

## 2018-06-05 ENCOUNTER — Ambulatory Visit: Payer: BLUE CROSS/BLUE SHIELD | Admitting: Occupational Therapy

## 2018-06-05 VITALS — BP 129/94 | HR 78

## 2018-06-05 DIAGNOSIS — M6281 Muscle weakness (generalized): Secondary | ICD-10-CM | POA: Diagnosis not present

## 2018-06-05 DIAGNOSIS — I69353 Hemiplegia and hemiparesis following cerebral infarction affecting right non-dominant side: Secondary | ICD-10-CM

## 2018-06-05 DIAGNOSIS — R278 Other lack of coordination: Secondary | ICD-10-CM

## 2018-06-05 DIAGNOSIS — R2689 Other abnormalities of gait and mobility: Secondary | ICD-10-CM

## 2018-06-05 NOTE — Therapy (Signed)
[facility]IN [facility] [address] [place], [place], [address] Phone: [phone]   Fax:  [phone]  Physical Therapy Treatment/Recertification  Dates of reporting period  04/15/18  to   06/05/18  Patient Details  Name: Jeremy [name] MRN: [id number] Date of Birth: 31-May-1976 Referring Provider (PT): Dr. Claudette LawsAndrew Kirsteins   Encounter Date: 06/05/2018  PT End of Session - 06/05/18 1307    Visit Number  6    Number of Visits  33    Date for PT Re-Evaluation  07/31/18    Authorization Type  30 visits total for OT and PT combined     Authorization Time Period  6/15 PT visit    PT Start Time  1310    PT Stop Time  1340    PT Time Calculation (min)  30 min    Equipment Utilized During Treatment  Gait belt;Other (comment)   R AFO, R subluxation brace   Activity Tolerance  Patient tolerated treatment well    Behavior During Therapy  WFL for tasks assessed/performed       Past Medical History:  Diagnosis Date  . Asthma    MILD AS A CHILD  . Deviated septum   . Dyspnea    NORMAL SPIROMETRY 02/01/17 VISIT WITH DR HEDRICK  . Hypertension   . Sleep apnea   . Stroke [facility](HCC)     Past Surgical History:  Procedure Laterality Date  . CRANIOPLASTY N/A 03/19/2018   Procedure: CRANIOPLASTY;  Surgeon: Lisbeth RenshawNundkumar, Neelesh, MD;  [facility];  Service: Neurosurgery;  Laterality: N/A;  CRANIOPLASTY  . CRANIOTOMY Left 07/29/2017   Procedure: LEFT HEMI- CRANIECTOMY WITH PLACEMENT OF BONE FLAP IN ABDOMEN;  Surgeon: Lisbeth RenshawNundkumar, Neelesh, MD;  Location: [facility] OR;  Service: Neurosurgery;  Laterality: Left;  . IR ANGIO INTRA EXTRACRAN SEL COM CAROTID INNOMINATE UNI R MOD SED  07/28/2017  . IR ANGIO VERTEBRAL SEL VERTEBRAL UNI R MOD SED  07/28/2017  . IR CT HEAD LTD  07/28/2017  . IR PERCUTANEOUS ART THROMBECTOMY/INFUSION INTRACRANIAL INC DIAG ANGIO  07/28/2017  . NASAL SEPTOPLASTY W/ TURBINOPLASTY Bilateral 03/06/2017   Procedure: NASAL SEPTOPLASTY WITH TURBINATE  REDUCTION;  Surgeon: Linus SalmonsMcQueen, Chapman, MD;  Location: [facility] ORS;  Service: ENT;  Laterality: Bilateral;  . RADIOLOGY WITH ANESTHESIA N/A 07/27/2017   Procedure: RADIOLOGY WITH ANESTHESIA;  Surgeon: Julieanne Cottoneveshwar, Sanjeev, MD;  Location: [facility] OR;  Service: Radiology;  Laterality: N/A;    Vitals:   06/05/18 1313  BP: (!) 129/94  Pulse: 78  SpO2: 98%    Subjective Assessment - 06/05/18 1307    Subjective  Pt reports he is doing well at this time. No specific questions or concerns at this time. Denies pain. Performing HEP 4x/wk. No falls since last visit.     Patient is accompained by:  Family member   wife and daughter   Pertinent History  Pt is a 42 y/o M who presented on Jul 27, 2017 with R sided weakness, headache, slurred speech. Cranial CT scan showed L insula and frontal operculum lucency, compatible with infarction. Underwent attempted mechanical thrombectomy per Interventional Radiology that was unsuccessful, required intubation. Follow-up MRI showed cerebellar edema, midline shift. Underwent L decompressive hemicraniectomy, placement of bone flap in abdominal pocket Jan 13th, 2019. [facility] and then received HHPT until end of April, followed by therapy at this clinic, then [facility]. Pt now ambulating with quad cane at all times, limited  primarily to in home ambulation. Uses riding cart at store. Pt is now able to go up/down curb on his own. Wife assists with donning sock and shoe on R, shirt on the R. Wife assist bringing RLE into tub but pt shower independently. Pt requires occasional assist with RLE for supine<>sit. Pt's goals: reduce L low back spasms (believe to be due to weight shift to L), walking longer, to get back to work, to be able to cook a full meal, improve his balance, to be able to carry something while walking. Pt had Botox injections Aug 2019 in RLE and RUE. Plan is for repeat on Nov 1st. Pt with bone flap surgery ~1 month ago,  pt denies precautions with this. Pt with h/o DVT in LLE and small PE in March 2019.    Limitations  Standing;Walking;House hold activities;Lifting    Patient Stated Goals  see above    Currently in Pain?  No/denies            TREATMENT   Ther-ex Completed outcome measures with patient including 5TSTS, TUG, 66m gait speed, and ; Updated goals with patient and discussed plan of care; Pt would like to work on ambulation without cane. Will work on body weight support ambulation in upcoming sessions.   Patient arrived late for appointment again which limited his time during session. Repeated outcome measures with patient. He demonstrates improvement in 10 meter gait speed, Five Time Sit to Stand, and Six Minute Walk Test distance since initial evaluation. Updated goals and plan of care with patient. He continues to demonstrates good motivation during therapy sessions.He will benefit from PT services to address deficits in strength, balance, and mobility in order to return to full function at home.                     PT Education - 06/05/18 1513    Education provided  Yes    Education Details  Outcome measures, goals, plan of care    Person(s) Educated  Patient    Methods  Explanation    Comprehension  Verbalized understanding       PT Short Term Goals - 06/05/18 1310      PT SHORT TERM GOAL #1   Title  Pt will completed HEP at least 4 days/wk for improved carryover between sessions    Baseline  06/05/18: Pt has been completing his HEP 4 days/wk    Time  2    Period  Weeks    Status  Achieved        PT Long Term Goals - 06/05/18 1310      PT LONG TERM GOAL #1   Title  Pt's will improve to equal or greater than 0.6 m/s for improved safety with ambulation in the home setting    Baseline  0.22 m/s; 06/05/18: self-selected: 42.2 = 0.24 m/s, fastest: 31.8s = 0.31 m/s    Time  8    Period  Weeks    Status  On-going    Target Date  07/31/18       PT LONG TERM GOAL #2   Title  Pt will be able to cook a full meal with min assist for improved QOL    Baseline  Pt able to perform simple meal prep (making sandwiches) but unable to cook full meal; 06/05/18: unchanged    Time  8    Period  Weeks    Status  On-going    Target Date  07/31/18      PT LONG TERM GOAL #3   Title  Pt's will improve to at least 500 ft to demonstrate improved ambulatory endurance and speed    Baseline  210 ft; 06/05/18: 300'    Time  8    Period  Weeks    Status  On-going    Target Date  07/31/18      PT LONG TERM GOAL #4   Title  Pt will improve 5xSTS to equal or less than 15 seconds without UE support to demonstrate improved balance and strength    Baseline  20.15 sec with LUE assist; 06/05/18: 15.0s with LUE assist, 18.5s without UE assist;    Time  8    Period  Weeks    Status  On-going    Target Date  07/31/18      PT LONG TERM GOAL #5   Title  Pt will be able to carry objects household distances for improved independence with homemaking and cooking activities.    Baseline  Carries small objects in L hand along with quad cane; 06/05/18: still unable to carry anything in R arm    Time  8    Period  Weeks    Status  On-going    Target Date  07/31/18      Additional Long Term Goals   Additional Long Term Goals  Yes      PT LONG TERM GOAL #6   Title   Pt will decrease TUG by 4 seconds in order to demonstrate decreased fall risk     Baseline  06/05/18: 35.6 seconds    Time  8    Period  Weeks    Status  New    Target Date  07/31/18            Plan - 06/05/18 1308    Clinical Impression Statement  Patient arrived late for appointment again which limited his time during session. Repeated outcome measures with patient. He demonstrates improvement in 10 meter gait speed, Five Time Sit to Stand, and Six Minute Walk Test distance since initial evaluation. Updated goals and plan of care with patient. He continues to demonstrates good  motivation during therapy sessions.He will benefit from PT services to address deficits in strength, balance, and mobility in order to return to full function at home.    Rehab Potential  Good    PT Frequency  2x / week    PT Duration  8 weeks    PT Treatment/Interventions  ADLs/Self Care Home Management;Aquatic Therapy;Cryotherapy;Electrical Stimulation;Iontophoresis 4mg /ml Dexamethasone;Moist Heat;Ultrasound;DME Instruction;Gait training;Stair training;Functional mobility training;Therapeutic activities;Therapeutic exercise;Balance training;Neuromuscular re-education;Cognitive remediation;Patient/family education;Orthotic Fit/Training;Manual techniques;Scar mobilization;Passive range of motion;Dry needling;Energy conservation;Splinting;Taping;Visual/perceptual remediation/compensation    PT Next Visit Plan  weight shifting activities, standing HEP    PT Home Exercise Plan  marching in hooklying, supine hip abduction    Consulted and Agree with Plan of Care  Patient;Family member/caregiver   wife   Family Member Consulted  wife       Patient will benefit from skilled therapeutic intervention in order to improve the following deficits and impairments:  Abnormal gait, Decreased activity tolerance, Decreased balance, Decreased cognition, Decreased coordination, Decreased endurance, Decreased knowledge of precautions, Decreased knowledge of use of DME, Decreased mobility, Decreased range of motion, Decreased safety awareness, Decreased skin integrity, Decreased scar mobility, Decreased strength, Difficulty walking, Hypomobility, Hypermobility, Increased fascial restricitons, Increased muscle spasms, Impaired perceived functional ability, Impaired flexibility, Impaired sensation, Impaired tone, Impaired UE functional use,  Impaired vision/preception, Improper body mechanics, Postural dysfunction, Pain  Visit Diagnosis: Muscle weakness (generalized) - Plan: PT plan of care cert/re-cert  Other lack of  coordination - Plan: PT plan of care cert/re-cert  Hemiplegia and hemiparesis following cerebral infarction affecting right non-dominant side (HCC) - Plan: PT plan of care cert/re-cert  Other abnormalities of gait and mobility - Plan: PT plan of care cert/re-cert     Problem List Patient Active Problem List   Diagnosis Date Noted  . History of cranioplasty 03/19/2018  . PE (pulmonary thromboembolism) (HCC) 10/06/2017  . DVT (deep vein thrombosis) in pregnancy 10/06/2017  . Acute blood loss anemia   . Vascular headache   . Spastic hemiplegia of right dominant side as late effect of cerebral infarction (HCC)   . Benign essential HTN   . Frontal lobe and executive function deficit following cerebral infarction   . Adjustment disorder with mixed anxiety and depressed mood   . 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 Aphasia due to acute stroke (HCC) 08/07/2017  . B12 deficiency   . Acute respiratory failure (HCC)   . Central line infiltration (HCC)   . Endotracheal tube present   . Fever   . Leukocytosis   . S/P craniotomy 07/30/2017  . Cerebral edema (HCC) 07/28/2017  . Hyperlipidemia 07/28/2017  . Stroke (cerebrum) (HCC) 07/27/2017   Lynnea Maizes PT, DPT, GCS  , 06/06/2018, 5:53 PM  [facility] MAIN [facility] SERVICES [address] [place], [place], [address] Phone: [phone]   Fax:  [phone]  Name: Jeremy [name] MRN: [id number] Date of Birth: 1975-07-24

## 2018-06-05 NOTE — Therapy (Addendum)
[facility] MAIN [facility] SERVICES [address] [place], [place], [address] Phone: [phone]   Fax:  [phone]  Occupational Therapy Treatment  Patient Details  Name: Jeremy [name] MRN: [id number] Date of Birth: Aug 31, 1975 No data recorded  Encounter Date: 06/05/2018  OT End of Session - 06/05/18 1749    Visit Number  6    Number of Visits  24    Date for OT Re-Evaluation  07/08/18    Authorization Type  [employer]    Authorization Time Period  visit limit 30    OT Start Time  1345    OT Stop Time  1430    OT Time Calculation (min)  45 min    Activity Tolerance  Patient tolerated treatment well    Behavior During Therapy  [facility] for tasks assessed/performed       Past Medical History:  Diagnosis Date  . Asthma    MILD AS A CHILD  . Deviated septum   . Dyspnea    NORMAL SPIROMETRY 02/01/17 VISIT WITH DR HEDRICK  . Hypertension   . Sleep apnea   . Stroke [facility])     Past Surgical History:  Procedure Laterality Date  . CRANIOPLASTY N/A 03/19/2018   Procedure: CRANIOPLASTY;  Surgeon: Lisbeth Renshaw, MD;  Location: [facility] OR;  Service: Neurosurgery;  Laterality: N/A;  CRANIOPLASTY  . CRANIOTOMY Left 07/29/2017   Procedure: LEFT HEMI- CRANIECTOMY WITH PLACEMENT OF BONE FLAP IN ABDOMEN;  Surgeon: Lisbeth Renshaw, MD;  Location: [facility] OR;  Service: Neurosurgery;  Laterality: Left;  . IR ANGIO INTRA EXTRACRAN SEL COM CAROTID INNOMINATE UNI R MOD SED  07/28/2017  . IR ANGIO VERTEBRAL SEL VERTEBRAL UNI R MOD SED  07/28/2017  . IR CT HEAD LTD  07/28/2017  . IR PERCUTANEOUS ART THROMBECTOMY/INFUSION INTRACRANIAL INC DIAG ANGIO  07/28/2017  . NASAL SEPTOPLASTY W/ TURBINOPLASTY Bilateral 03/06/2017   Procedure: NASAL SEPTOPLASTY WITH TURBINATE REDUCTION;  Surgeon: Linus Salmons, MD;  Location: [facility] ORS;  Service: ENT;  Laterality: Bilateral;  . RADIOLOGY WITH ANESTHESIA N/A 07/27/2017   Procedure: RADIOLOGY WITH ANESTHESIA;  Surgeon: Julieanne Cotton, MD;   Location: [facility] OR;  Service: Radiology;  Laterality: N/A;    There were no vitals filed for this visit.  Subjective Assessment - 06/05/18 1747    Subjective   Pt reports that he had a nice, relaxing birthday at home last week.    Patient is accompained by:  Family member    Pertinent History  Pt is a 42 y/o M who presented on Jul 27, 2017 with R sided weakness, headache, slurred speech. Cranial CT scan showed L insula and frontal operculum lucency, compatible with infarction. Underwent attempted mechanical thrombectomy per Interventional Radiology that was unsuccessful, required intubation. Follow-up MRI showed cerebellar edema, midline shift. Underwent L decompressive hemicraniectomy, placement of bone flap in abdominal pocket Jan 13th, 2019. Pt went to CIR at [facility] until 2/21 and then received HHPT until end of April, followed by therapy at this clinic, then [facility] once his insurance benefits ran out.  Patient now has new insurance and has returned to therapy for OT evaluation.      Patient Stated Goals  Patient would like to regain ability to take care of self.    Currently in Pain?  No/denies    Pain Score  0-No pain      OT TREATMENT   Neuro muscular re-education:  Pt worked on weightbearing, and proprioceptive awareness through his  RUE, and hand to normalize tone, and prepare the RUE for ROM, and faciitation of functional hand use. Pt. worked on weightbearing through multiple planes. Pt. required assist with preparing the wrist, and digits for extension, and weightbearing at the mat. Pt completed weight bearing tasks today while wearing paddle splint as pt's 2nd digit flexor tone was increased. Pt was educated on the importance of slow, gentle movements when inhibiting tone.  Therapeutic Exercise:  Pt. tolerated PROM in all joint ranges of the RUE, and hand in supine. No active ROM.  Manual Therapy:  Pt. tolerated scapular mobilizations in  elevation, depression, and rotation to normalize tone, and prepare the RUE for ROM.                  OT Education - 06/05/18 1748    Education provided  Yes    Education Details  RUE ROM, tone management, one-handed UB dressing techniques    Person(s) Educated  Patient    Methods  Explanation;Demonstration;Verbal cues    Comprehension  Verbalized understanding;Returned demonstration          OT Long Term Goals - 04/15/18 1918      OT LONG TERM GOAL #1   Title  Patient will demonstrate improved balance to perform clothing negotiation with dressing/toileting with supervision only.     Baseline  min assist    Time  12    Period  Weeks    Status  New    Target Date  07/08/18      OT LONG TERM GOAL #2   Title  Patient will demonstrate ability to manage shoulder brace with modified independence.    Baseline  unable at eval    Time  4    Period  Weeks    Status  New    Target Date  05/13/18      OT LONG TERM GOAL #3   Title  Patient will demonstrate trace movement of elbow extension with facilitation to work towards neuromuscular reeducation for active arm use on the right .    Baseline  no active movement at eval    Time  12    Period  Weeks    Status  New    Target Date  07/08/18      OT LONG TERM GOAL #4   Title  Patient will demonstate understanding of compensatory strategies to protect right UE from harm during engagement  in daily activities.     Baseline  sensation absent in right UE and at risk for injury.    Time  3    Period  Weeks    Status  New    Target Date  05/06/18      OT LONG TERM GOAL #5   Title  Patient will complete cutting meat one handed with modified independence with adaptive equipment as needed.     Baseline  unable at eval    Time  12    Period  Weeks    Status  New    Target Date  07/08/18      Long Term Additional Goals   Additional Long Term Goals  Yes      OT LONG TERM GOAL #6   Title  Patient will complete UB  dressing with modified independence    Baseline  min assist     Time  12    Period  Weeks    Status  New    Target Date  07/08/18  OT LONG TERM GOAL #7   Title  Patient will complete shoe and sock donning with modified independence.    Baseline  unable at eval    Time  12    Period  Weeks    Status  New    Target Date  07/08/18      OT LONG TERM GOAL #8   Title  Patient will complete lower body dressing with occasional assist for buttons/zippers.     Baseline  min assist    Time  12    Period  Weeks    Status  New    Target Date  07/08/18            Plan - 06/05/18 1749    Clinical Impression Statement  Pt continues to present with RUE ROM deficits, shoulder subluxation, and increased tone. Pt continues to work on tone inhibition techniques to manage tone and prepare RUE for ADLs and IADLs. Pt worked on one-handed UB dressing techniques to increase independence and efficiency. Pt required increased during and demonstration for accuracy.    Occupational Profile and client history currently impacting functional performance  absent sensation in RUE from shoulder to digits, no active right UE movement, decreased right peripheral vision, has 706 month old infant at home, recent move from 3rd story apartment, unable to work, bone flap replaced in 9/19    Occupational performance deficits (Please refer to evaluation for details):  ADL's;IADL's;Leisure;Work    Current Impairments/barriers affecting progress:  positive:  motivation, family support.  Negative:  young age, lack of sensation on the right for light touch, sharp/dull, deep pressure, hot/cold, processing delay cognitively    OT Frequency  2x / week    OT Duration  12 weeks    OT Treatment/Interventions  Self-care/ADL training;Therapeutic exercise;Neuromuscular education;Patient/family education;Building services engineerunctional Mobility Training;Therapeutic activities;Balance training;DME and/or AE instruction;Manual Therapy;Passive range of motion     Clinical Decision Making  Several treatment options, min-mod task modification necessary    Consulted and Agree with Plan of Care  Patient;Family member/caregiver    Family Member Consulted  Pt contine       Patient will benefit from skilled therapeutic intervention in order to improve the following deficits and impairments:  Abnormal gait, Decreased cognition, Decreased knowledge of use of DME, Increased edema, Pain, Decreased coordination, Decreased mobility, Impaired sensation, Decreased activity tolerance, Decreased endurance, Decreased range of motion, Decreased strength, Impaired tone, Decreased balance, Decreased knowledge of precautions, Decreased safety awareness, Difficulty walking, Impaired UE functional use  Visit Diagnosis: Muscle weakness (generalized)    Problem List Patient Active Problem List   Diagnosis Date Noted  . History of cranioplasty 03/19/2018  . PE (pulmonary thromboembolism) (HCC) 10/06/2017  . DVT (deep vein thrombosis) in pregnancy 10/06/2017  . Acute blood loss anemia   . Vascular headache   . Spastic hemiplegia of right dominant side as late effect of cerebral infarction (HCC)   . Benign essential HTN   . Frontal lobe and executive function deficit following cerebral infarction   . Adjustment disorder with mixed anxiety and depressed mood   . 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 Aphasia due to acute stroke (HCC) 08/07/2017  . B12 deficiency   . Acute respiratory failure (HCC)   . Central line infiltration (HCC)   . Endotracheal tube present   . Fever   . Leukocytosis   . S/P craniotomy 07/30/2017  . Cerebral edema (HCC) 07/28/2017  . Hyperlipidemia 07/28/2017  . Stroke (cerebrum) (HCC) 07/27/2017  Ernesto Rutherford, OTS 06/05/2018, 5:52 PM   This entire session was performed under direct supervision and direction of a licensed therapist/therapist assistant . I have personally read, edited and approve of the note as  written.  Olegario Messier, MS, OTR/L   [facility] MAIN [facility] SERVICES [address] [place], [place], [address] Phone: [phone]   Fax:  [phone]  Name: Jeremy [name] MRN: [id number] Date of Birth: 06-May-1976

## 2018-06-12 ENCOUNTER — Ambulatory Visit: Payer: BLUE CROSS/BLUE SHIELD

## 2018-06-12 ENCOUNTER — Encounter: Payer: Self-pay | Admitting: Occupational Therapy

## 2018-06-12 ENCOUNTER — Ambulatory Visit: Payer: BLUE CROSS/BLUE SHIELD | Admitting: Occupational Therapy

## 2018-06-12 DIAGNOSIS — R278 Other lack of coordination: Secondary | ICD-10-CM

## 2018-06-12 DIAGNOSIS — M6281 Muscle weakness (generalized): Secondary | ICD-10-CM | POA: Diagnosis not present

## 2018-06-12 NOTE — Therapy (Signed)
[facility] MAIN [facility] SERVICES [address] [place], [place], [address] Phone: [phone]   Fax:  [phone]  Occupational Therapy Treatment  Patient Details  Name: Jeremy [name] MRN: [id number] Date of Birth: 1976-02-12 No data recorded  Encounter Date: 06/12/2018  OT End of Session - 06/12/18 1402    Visit Number  7    Number of Visits  24    Date for OT Re-Evaluation  07/08/18    Authorization Type  BC[employer]    Authorization Time Period  visit limit 30    OT Start Time  1315    OT Stop Time  1345    OT Time Calculation (min)  30 min    Activity Tolerance  Patient tolerated treatment well    Behavior During Therapy  [facility] for tasks assessed/performed       Past Medical History:  Diagnosis Date  . Asthma    MILD AS A CHILD  . Deviated septum   . Dyspnea    NORMAL SPIROMETRY 02/01/17 VISIT WITH DR HEDRICK  . Hypertension   . Sleep apnea   . Stroke [facility])     Past Surgical History:  Procedure Laterality Date  . CRANIOPLASTY N/A 03/19/2018   Procedure: CRANIOPLASTY;  Surgeon: Lisbeth Renshaw, MD;  Location: [facility] OR;  Service: Neurosurgery;  Laterality: N/A;  CRANIOPLASTY  . CRANIOTOMY Left 07/29/2017   Procedure: LEFT HEMI- CRANIECTOMY WITH PLACEMENT OF BONE FLAP IN ABDOMEN;  Surgeon: Lisbeth Renshaw, MD;  Location: [facility] OR;  Service: Neurosurgery;  Laterality: Left;  . IR ANGIO INTRA EXTRACRAN SEL COM CAROTID INNOMINATE UNI R MOD SED  07/28/2017  . IR ANGIO VERTEBRAL SEL VERTEBRAL UNI R MOD SED  07/28/2017  . IR CT HEAD LTD  07/28/2017  . IR PERCUTANEOUS ART THROMBECTOMY/INFUSION INTRACRANIAL INC DIAG ANGIO  07/28/2017  . NASAL SEPTOPLASTY W/ TURBINOPLASTY Bilateral 03/06/2017   Procedure: NASAL SEPTOPLASTY WITH TURBINATE REDUCTION;  Surgeon: Linus Salmons, MD;  Location: [facility] ORS;  Service: ENT;  Laterality: Bilateral;  . RADIOLOGY WITH ANESTHESIA N/A 07/27/2017   Procedure: RADIOLOGY WITH ANESTHESIA;  Surgeon: Julieanne Cotton, MD;   Location: [facility] OR;  Service: Radiology;  Laterality: N/A;    There were no vitals filed for this visit.  Subjective Assessment - 06/12/18 1401    Subjective   Pt. reports they are having family in for Thanksgiving    Patient is accompained by:  Family member    Pertinent History  Pt is a 42 y/o M who presented on Jul 27, 2017 with R sided weakness, headache, slurred speech. Cranial CT scan showed L insula and frontal operculum lucency, compatible with infarction. Underwent attempted mechanical thrombectomy per Interventional Radiology that was unsuccessful, required intubation. Follow-up MRI showed cerebellar edema, midline shift. Underwent L decompressive hemicraniectomy, placement of bone flap in abdominal pocket Jan 13th, 2019. Pt went to CIR at [facility] until 2/21 and then received HHPT until end of April, followed by therapy at this clinic, then OPPT at [facility]ono clinic once his insurance benefits ran out.  Patient now has new insurance and has returned to therapy for OT evaluation.      Patient Stated Goals  Patient would like to regain ability to take care of self.    Currently in Pain?  No/denies       OT TREATMENT    Neuro muscular re-education:  Pt. worked on weightbearing through his RUE and hand to normalize tone and prepare the UE for ROM. Pt.  required support, and alignment of the humeral head at the axilla during weightbearing while crossing midline to reach in various planes. Pt. worked on rotation, and elongation seated with the swiss ball.   Therapeutic Exercise:  Pt. tolerated PROM in all joint ranges of the RUE, and hand including shoulder flexion, abduction, horizontal abduction, elbow flexion, extension, forearm supination, wrist flexion, extension, and digit MP flexion, and extension. Pt. Attempted shoulder stabilization exercises in supine with the shoulder flexed to 90 degrees with elbow extended. Pt. was unable to hold.  Manual Therapy:  Pt. tolerated  scapular mobilizations in sitting elevation, depression, abduction/adduction, rotation.  Self-care:  Pt. Was able to donn his jacket with set-up, verbal cues and increased time to complete. Pt. required assist to stabilize, and manage the zipper.                       OT Education - 06/12/18 1402    Education provided  Yes    Education Details  RUE ROM, tone management, one-handed UB dressing techniques    Person(s) Educated  Patient    Methods  Explanation;Demonstration;Verbal cues    Comprehension  Verbalized understanding;Returned demonstration          OT Long Term Goals - 04/15/18 1918      OT LONG TERM GOAL #1   Title  Patient will demonstrate improved balance to perform clothing negotiation with dressing/toileting with supervision only.     Baseline  min assist    Time  12    Period  Weeks    Status  New    Target Date  07/08/18      OT LONG TERM GOAL #2   Title  Patient will demonstrate ability to manage shoulder brace with modified independence.    Baseline  unable at eval    Time  4    Period  Weeks    Status  New    Target Date  05/13/18      OT LONG TERM GOAL #3   Title  Patient will demonstrate trace movement of elbow extension with facilitation to work towards neuromuscular reeducation for active arm use on the right .    Baseline  no active movement at eval    Time  12    Period  Weeks    Status  New    Target Date  07/08/18      OT LONG TERM GOAL #4   Title  Patient will demonstate understanding of compensatory strategies to protect right UE from harm during engagement  in daily activities.     Baseline  sensation absent in right UE and at risk for injury.    Time  3    Period  Weeks    Status  New    Target Date  05/06/18      OT LONG TERM GOAL #5   Title  Patient will complete cutting meat one handed with modified independence with adaptive equipment as needed.     Baseline  unable at eval    Time  12    Period  Weeks     Status  New    Target Date  07/08/18      Long Term Additional Goals   Additional Long Term Goals  Yes      OT LONG TERM GOAL #6   Title  Patient will complete UB dressing with modified independence    Baseline  min assist     Time  12    Period  Weeks    Status  New    Target Date  07/08/18      OT LONG TERM GOAL #7   Title  Patient will complete shoe and sock donning with modified independence.    Baseline  unable at eval    Time  12    Period  Weeks    Status  New    Target Date  07/08/18      OT LONG TERM GOAL #8   Title  Patient will complete lower body dressing with occasional assist for buttons/zippers.     Baseline  min assist    Time  12    Period  Weeks    Status  New    Target Date  07/08/18            Plan - 06/12/18 1402    Clinical Impression Statement Pt. has an appointmnet next week for a new AFO which will be built into the heel of his shoe. Pt. continues to present with fluctuating tone throughout the RUE, and hand. Pt. presents with decreased tone the proximally, increased extensor tone in the elbow, and increased flexor tone in the wrist, and digits. No active movement has been elicited this date. Pt. continues to work on improving RUE functioning for improved engagement during functional ADL, and IADL tasks.     Occupational Profile and client history currently impacting functional performance  absent sensation in RUE from shoulder to digits, no active right UE movement, decreased right peripheral vision, has 476 month old infant at home, recent move from 3rd story apartment, unable to work, bone flap replaced in 9/19    Occupational performance deficits (Please refer to evaluation for details):  ADL's;IADL's;Leisure;Work    Current Impairments/barriers affecting progress:  positive:  motivation, family support.  Negative:  young age, lack of sensation on the right for light touch, sharp/dull, deep pressure, hot/cold, processing delay cognitively    OT  Frequency  2x / week    OT Duration  12 weeks    OT Treatment/Interventions  Self-care/ADL training;Therapeutic exercise;Neuromuscular education;Patient/family education;Building services engineerunctional Mobility Training;Therapeutic activities;Balance training;DME and/or AE instruction;Manual Therapy;Passive range of motion    Clinical Decision Making  Several treatment options, min-mod task modification necessary    Consulted and Agree with Plan of Care  Patient;Family member/caregiver    Family Member Consulted  wife       Patient will benefit from skilled therapeutic intervention in order to improve the following deficits and impairments:  Abnormal gait, Decreased cognition, Decreased knowledge of use of DME, Increased edema, Pain, Decreased coordination, Decreased mobility, Impaired sensation, Decreased activity tolerance, Decreased endurance, Decreased range of motion, Decreased strength, Impaired tone, Decreased balance, Decreased knowledge of precautions, Decreased safety awareness, Difficulty walking, Impaired UE functional use  Visit Diagnosis: Muscle weakness (generalized)    Problem List Patient Active Problem List   Diagnosis Date Noted  . History of cranioplasty 03/19/2018  . PE (pulmonary thromboembolism) (HCC) 10/06/2017  . DVT (deep vein thrombosis) in pregnancy 10/06/2017  . Acute blood loss anemia   . Vascular headache   . Spastic hemiplegia of right dominant side as late effect of cerebral infarction (HCC)   . Benign essential HTN   . Frontal lobe and executive function deficit following cerebral infarction   . Adjustment disorder with mixed anxiety and depressed mood   . 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 Aphasia due to acute stroke (HCC) 08/07/2017  . B12  deficiency   . Acute respiratory failure (HCC)   . Central line infiltration (HCC)   . Endotracheal tube present   . Fever   . Leukocytosis   . S/P craniotomy 07/30/2017  . Cerebral edema (HCC) 07/28/2017  .  Hyperlipidemia 07/28/2017  . Stroke (cerebrum) (HCC) 07/27/2017    Olegario Messier, MS, OTR/L 06/12/2018, 2:13 PM  [facility] MAIN [facility] SERVICES [address] [place], [place], [address] Phone: [phone]   Fax:  [phone]  Name: Jeremy [name] MRN: [id number] Date of Birth: [birth year]

## 2018-06-12 NOTE — Therapy (Signed)
[facility] [address] [place], [place], [address] Phone: [phone]   Fax:  [phone]  Physical Therapy Treatment  Patient Details  Name: Jeremy GashJao Batchelder MRN: [id number] Date of Birth: 27-Feb-1976 Referring Provider (PT): Dr. Claudette LawsAndrew Kirsteins   Encounter Date: 06/12/2018  PT End of Session - 06/12/18 1121    Visit Number  7    Number of Visits  33    Date for PT Re-Evaluation  07/31/18    Authorization Type  30 visits total for OT and PT combined     Authorization Time Period  7/15 PT visit    PT Start Time  1115    PT Stop Time  1145    PT Time Calculation (min)  30 min    Equipment Utilized During Treatment  Gait belt;Other (comment)   R AFO, R subluxation brace   Activity Tolerance  Patient tolerated treatment well    Behavior During Therapy  WFL for tasks assessed/performed       Past Medical History:  Diagnosis Date  . Asthma    MILD AS A CHILD  . Deviated septum   . Dyspnea    NORMAL SPIROMETRY 02/01/17 VISIT WITH DR HEDRICK  . Hypertension   . Sleep apnea   . Stroke [facility](HCC)     Past Surgical History:  Procedure Laterality Date  . CRANIOPLASTY N/A 03/19/2018   Procedure: CRANIOPLASTY;  Surgeon: Lisbeth RenshawNundkumar, Neelesh, MD;  [facility];  Service: Neurosurgery;  Laterality: N/A;  CRANIOPLASTY  . CRANIOTOMY Left 07/29/2017   Procedure: LEFT HEMI- CRANIECTOMY WITH PLACEMENT OF BONE FLAP IN ABDOMEN;  Surgeon: Lisbeth RenshawNundkumar, Neelesh, MD;  Location: [facility] OR;  Service: Neurosurgery;  Laterality: Left;  . IR ANGIO INTRA EXTRACRAN SEL COM CAROTID INNOMINATE UNI R MOD SED  07/28/2017  . IR ANGIO VERTEBRAL SEL VERTEBRAL UNI R MOD SED  07/28/2017  . IR CT HEAD LTD  07/28/2017  . IR PERCUTANEOUS ART THROMBECTOMY/INFUSION INTRACRANIAL INC DIAG ANGIO  07/28/2017  . NASAL SEPTOPLASTY W/ TURBINOPLASTY Bilateral 03/06/2017   Procedure: NASAL SEPTOPLASTY WITH TURBINATE REDUCTION;  Surgeon: Linus SalmonsMcQueen, Chapman, MD;  Location: [facility] ORS;   Service: ENT;  Laterality: Bilateral;  . RADIOLOGY WITH ANESTHESIA N/A 07/27/2017   Procedure: RADIOLOGY WITH ANESTHESIA;  Surgeon: Julieanne Cottoneveshwar, Sanjeev, MD;  Location: [facility] OR;  Service: Radiology;  Laterality: N/A;    There were no vitals filed for this visit.  Subjective Assessment - 06/12/18 1120    Subjective  Pt reports he is doing well at this time. No specific questions or concerns at this time. Denies pain. Performing HEP 4x/wk. No falls since last visit. Gets a new brace for R ankle next week.     Patient is accompained by:  Family member   wife and daughter   Pertinent History  Pt is a 42 y/o M who presented on Jul 27, 2017 with R sided weakness, headache, slurred speech. Cranial CT scan showed L insula and frontal operculum lucency, compatible with infarction. Underwent attempted mechanical thrombectomy per Interventional Radiology that was unsuccessful, required intubation. Follow-up MRI showed cerebellar edema, midline shift. Underwent L decompressive hemicraniectomy, placement of bone flap in abdominal pocket Jan 13th, 2019. Pt went to CIR at [facility] until 2/21 and then received HHPT until end of April, followed by therapy at this clinic, then [facility]. Pt now ambulating with quad cane at all times, limited primarily to in home ambulation. Uses riding cart at store. Pt is  now able to go up/down curb on his own. Wife assists with donning sock and shoe on R, shirt on the R. Wife assist bringing RLE into tub but pt shower independently. Pt requires occasional assist with RLE for supine<>sit. Pt's goals: reduce L low back spasms (believe to be due to weight shift to L), walking longer, to get back to work, to be able to cook a full meal, improve his balance, to be able to carry something while walking. Pt had Botox injections Aug 2019 in RLE and RUE. Plan is for repeat on Nov 1st. Pt with bone flap surgery ~1 month ago, pt denies precautions with this. Pt with h/o DVT  in LLE and small PE in March 2019.    Limitations  Standing;Walking;House hold activities;Lifting    Patient Stated Goals  see above    Currently in Pain?  No/denies           TREATMENT   Ther-ex Seated marches x 10 bilateral;  Seated clams withgentle manual resistance Seated adductor squeeze with gentle manual resistance 2 x 15; Sit to stand withLLE elevated and extended andtherapist pushing weight to R side from regular height chair with Airex pad on seated 2 x 10;   Gait Training Gait training in rehab gym with Lite Gait without assistive device. Pt with notable instability initially but this improves somewhat with practice. Pt with improving weight acceptance to RLE. Intermittent RLE buckling. Total distance ambulated approximate 150'. Pt provided cues for stepping and posture as well as weight shifting to RLE.   Pt educated throughout session about proper posture and technique with exercises. Improved exercise technique, movement at target joints, use of target muscles after min to mod verbal, visual, tactile cues.    Patient arrived late for appointment again which limited his time during session. Performed gait training in rehab gym with Lite Gait without an assistive device. Pt with notable instability initially but this improves somewhat with practice. Pt with improving weight acceptance to RLE. Intermittent RLE buckling. Total distance ambulated approximate 150'. He is able to complete strengthening exercises with increased weight acceptance to RLE during sit to stand.He will benefit from PT services to address deficits in strength, balance, and mobility in order to return to full function at home.                      PT Short Term Goals - 06/05/18 1310      PT SHORT TERM GOAL #1   Title  Pt will completed HEP at least 4 days/wk for improved carryover between sessions    Baseline  06/05/18: Pt has been completing his HEP 4 days/wk    Time  2     Period  Weeks    Status  Achieved        PT Long Term Goals - 06/05/18 1310      PT LONG TERM GOAL #1   Title  Pt's will improve to equal or greater than 0.6 m/s for improved safety with ambulation in the home setting    Baseline  0.22 m/s; 06/05/18: self-selected: 42.2 = 0.24 m/s, fastest: 31.8s = 0.31 m/s    Time  8    Period  Weeks    Status  On-going    Target Date  07/31/18      PT LONG TERM GOAL #2   Title  Pt will be able to cook a full meal with min assist for improved QOL  Baseline  Pt able to perform simple meal prep (making sandwiches) but unable to cook full meal; 06/05/18: unchanged    Time  8    Period  Weeks    Status  On-going    Target Date  07/31/18      PT LONG TERM GOAL #3   Title  Pt's will improve to at least 500 ft to demonstrate improved ambulatory endurance and speed    Baseline  210 ft; 06/05/18: 300'    Time  8    Period  Weeks    Status  On-going    Target Date  07/31/18      PT LONG TERM GOAL #4   Title  Pt will improve 5xSTS to equal or less than 15 seconds without UE support to demonstrate improved balance and strength    Baseline  20.15 sec with LUE assist; 06/05/18: 15.0s with LUE assist, 18.5s without UE assist;    Time  8    Period  Weeks    Status  On-going    Target Date  07/31/18      PT LONG TERM GOAL #5   Title  Pt will be able to carry objects household distances for improved independence with homemaking and cooking activities.    Baseline  Carries small objects in L hand along with quad cane; 06/05/18: still unable to carry anything in R arm    Time  8    Period  Weeks    Status  On-going    Target Date  07/31/18      Additional Long Term Goals   Additional Long Term Goals  Yes      PT LONG TERM GOAL #6   Title   Pt will decrease TUG by 4 seconds in order to demonstrate decreased fall risk     Baseline  06/05/18: 35.6 seconds    Time  8    Period  Weeks    Status  New    Target Date  07/31/18             Plan - 06/12/18 1123    Clinical Impression Statement  Patient arrived late for appointment again which limited his time during session. Performed gait training in rehab gym with Lite Gait without an assistive device. Pt with notable instability initially but this improves somewhat with practice. Pt with improving weight acceptance to RLE. Intermittent RLE buckling. Total distance ambulated approximate 150'. He is able to complete strengthening exercises with increased weight acceptance to RLE during sit to stand.He will benefit from PT services to address deficits in strength, balance, and mobility in order to return to full function at home    Rehab Potential  Good    PT Frequency  2x / week    PT Duration  8 weeks    PT Treatment/Interventions  ADLs/Self Care Home Management;Aquatic Therapy;Cryotherapy;Electrical Stimulation;Iontophoresis 4mg /ml Dexamethasone;Moist Heat;Ultrasound;DME Instruction;Gait training;Stair training;Functional mobility training;Therapeutic activities;Therapeutic exercise;Balance training;Neuromuscular re-education;Cognitive remediation;Patient/family education;Orthotic Fit/Training;Manual techniques;Scar mobilization;Passive range of motion;Dry needling;Energy conservation;Splinting;Taping;Visual/perceptual remediation/compensation    PT Next Visit Plan  weight shifting activities, standing HEP    PT Home Exercise Plan  marching in hooklying, supine hip abduction    Consulted and Agree with Plan of Care  Patient;Family member/caregiver   wife   Family Member Consulted  wife       Patient will benefit from skilled therapeutic intervention in order to improve the following deficits and impairments:  Abnormal gait, Decreased activity tolerance, Decreased balance, Decreased cognition,  Decreased coordination, Decreased endurance, Decreased knowledge of precautions, Decreased knowledge of use of DME, Decreased mobility, Decreased range of motion, Decreased safety  awareness, Decreased skin integrity, Decreased scar mobility, Decreased strength, Difficulty walking, Hypomobility, Hypermobility, Increased fascial restricitons, Increased muscle spasms, Impaired perceived functional ability, Impaired flexibility, Impaired sensation, Impaired tone, Impaired UE functional use, Impaired vision/preception, Improper body mechanics, Postural dysfunction, Pain  Visit Diagnosis: Muscle weakness (generalized)  Other lack of coordination     Problem List Patient Active Problem List   Diagnosis Date Noted  . History of cranioplasty 03/19/2018  . PE (pulmonary thromboembolism) (HCC) 10/06/2017  . DVT (deep vein thrombosis) in pregnancy 10/06/2017  . Acute blood loss anemia   . Vascular headache   . Spastic hemiplegia of right dominant side as late effect of cerebral infarction (HCC)   . Benign essential HTN   . Frontal lobe and executive function deficit following cerebral infarction   . Adjustment disorder with mixed anxiety and depressed mood   . 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 Aphasia due to acute stroke (HCC) 08/07/2017  . B12 deficiency   . Acute respiratory failure (HCC)   . Central line infiltration (HCC)   . Endotracheal tube present   . Fever   . Leukocytosis   . S/P craniotomy 07/30/2017  . Cerebral edema (HCC) 07/28/2017  . Hyperlipidemia 07/28/2017  . Stroke (cerebrum) (HCC) 07/27/2017    Marigrace Mccole 06/12/2018, 3:07 PM  [facility] MAIN [facility] SERVICES [address] [place], [place], [address] Phone: [phone]   Fax:  [phone]  Name: Cardell Rachel MRN: [id number] Date of Birth: [birth year]

## 2018-06-17 ENCOUNTER — Encounter: Payer: BLUE CROSS/BLUE SHIELD | Admitting: Occupational Therapy

## 2018-06-18 ENCOUNTER — Encounter: Payer: BLUE CROSS/BLUE SHIELD | Admitting: Occupational Therapy

## 2018-06-18 ENCOUNTER — Ambulatory Visit: Payer: BLUE CROSS/BLUE SHIELD | Admitting: Physical Therapy

## 2018-06-20 ENCOUNTER — Ambulatory Visit: Payer: BLUE CROSS/BLUE SHIELD

## 2018-06-24 ENCOUNTER — Ambulatory Visit: Payer: BLUE CROSS/BLUE SHIELD | Admitting: Occupational Therapy

## 2018-06-24 ENCOUNTER — Encounter: Payer: Self-pay | Admitting: Occupational Therapy

## 2018-06-24 ENCOUNTER — Ambulatory Visit: Payer: BLUE CROSS/BLUE SHIELD | Attending: Physical Medicine & Rehabilitation

## 2018-06-24 DIAGNOSIS — M6281 Muscle weakness (generalized): Secondary | ICD-10-CM

## 2018-06-24 DIAGNOSIS — I69353 Hemiplegia and hemiparesis following cerebral infarction affecting right non-dominant side: Secondary | ICD-10-CM

## 2018-06-24 DIAGNOSIS — R2689 Other abnormalities of gait and mobility: Secondary | ICD-10-CM | POA: Diagnosis present

## 2018-06-24 DIAGNOSIS — R278 Other lack of coordination: Secondary | ICD-10-CM | POA: Insufficient documentation

## 2018-06-24 NOTE — Therapy (Signed)
[facility] MAIN [facility] SERVICES [address] [place], [place], [address] Phone: [phone]   Fax:  [phone]  Occupational Therapy Treatment  Patient Details  Name: Jeremy [name] MRN: [id number] Date of Birth: 17-Dec-1975 No data recorded  Encounter Date: 06/24/2018  OT End of Session - 06/24/18 1613    Visit Number  8    Number of Visits  24    Date for OT Re-Evaluation  07/08/18    Authorization Type  [employer]    Authorization Time Period  visit limit 30    OT Start Time  1433    OT Stop Time  1515    OT Time Calculation (min)  42 min    Activity Tolerance  Patient tolerated treatment well    Behavior During Therapy  [facility] for tasks assessed/performed       Past Medical History:  Diagnosis Date  . Asthma    MILD AS A CHILD  . Deviated septum   . Dyspnea    NORMAL SPIROMETRY 02/01/17 VISIT WITH DR HEDRICK  . Hypertension   . Sleep apnea   . Stroke [facility])     Past Surgical History:  Procedure Laterality Date  . CRANIOPLASTY N/A 03/19/2018   Procedure: CRANIOPLASTY;  Surgeon: Lisbeth Renshaw, MD;  Location: [facility] OR;  Service: Neurosurgery;  Laterality: N/A;  CRANIOPLASTY  . CRANIOTOMY Left 07/29/2017   Procedure: LEFT HEMI- CRANIECTOMY WITH PLACEMENT OF BONE FLAP IN ABDOMEN;  Surgeon: Lisbeth Renshaw, MD;  Location: [facility] OR;  Service: Neurosurgery;  Laterality: Left;  . IR ANGIO INTRA EXTRACRAN SEL COM CAROTID INNOMINATE UNI R MOD SED  07/28/2017  . IR ANGIO VERTEBRAL SEL VERTEBRAL UNI R MOD SED  07/28/2017  . IR CT HEAD LTD  07/28/2017  . IR PERCUTANEOUS ART THROMBECTOMY/INFUSION INTRACRANIAL INC DIAG ANGIO  07/28/2017  . NASAL SEPTOPLASTY W/ TURBINOPLASTY Bilateral 03/06/2017   Procedure: NASAL SEPTOPLASTY WITH TURBINATE REDUCTION;  Surgeon: Linus Salmons, MD;  Location: [facility] ORS;  Service: ENT;  Laterality: Bilateral;  . RADIOLOGY WITH ANESTHESIA N/A 07/27/2017   Procedure: RADIOLOGY WITH ANESTHESIA;  Surgeon: Julieanne Cotton, MD;   Location: [facility] OR;  Service: Radiology;  Laterality: N/A;    There were no vitals filed for this visit.  Subjective Assessment - 06/24/18 1612    Subjective   Pt. reports he is getting ready for the Holdidays.    Patient is accompained by:  Family member    Pertinent History  Pt is a 42 y/o M who presented on Jul 27, 2017 with R sided weakness, headache, slurred speech. Cranial CT scan showed L insula and frontal operculum lucency, compatible with infarction. Underwent attempted mechanical thrombectomy per Interventional Radiology that was unsuccessful, required intubation. Follow-up MRI showed cerebellar edema, midline shift. Underwent L decompressive hemicraniectomy, placement of bone flap in abdominal pocket Jan 13th, 2019. Pt went to CIR at [facility] until 2/21 and then received HHPT until end of April, followed by therapy at this clinic, then OPPT at [facility] probono [facility] once his insurance benefits ran out.  Patient now has new insurance and has returned to therapy for OT evaluation.      Patient Stated Goals  Patient would like to regain ability to take care of self.    Currently in Pain?  No/denies      OT TREATMENT    Neuro muscular re-education:  Pt. worked on weightbearing through his RUE and hand to normalize tone and prepare the UE for ROM. Pt. required  support, and alignment of the humeral head at the axilla during weightbearing while crossing midline to reach in various planes. Pt. worked on rotation, and elongation seated with the swiss ball. Pt. Worked on reaching in high, and low planes with resistive velcro cubes, and resistive clips while weightbearing through his RUE.  Therapeutic Exercise:  Pt. tolerated PROM in all joint ranges of the RUE, and hand including shoulder flexion, abduction, horizontal abduction, elbow flexion, extension, forearm supination, wrist flexion, extension, and digit MP flexion, and extension. Pt. Attempted shoulder stabilization exercises in  supine with the shoulder flexed to 90 degrees with elbow extended. Pt. was unable to hold.  Manual Therapy:  Pt. tolerated scapular mobilizations in sitting elevation, depression, abduction/adduction, rotation.  Self-care:  Pt. was able to donn his jacket with set-up, verbal cues and increased time to complete. Pt. required assist to stabilize, and manage the zipper. Pt. required increased assist to doff the jacket.                        OT Education - 06/24/18 1613    Education provided  Yes    Education Details  RUE ROM, tone management, one-handed UB dressing techniques    Person(s) Educated  Patient    Methods  Explanation;Demonstration;Verbal cues    Comprehension  Verbalized understanding;Returned demonstration          OT Long Term Goals - 04/15/18 1918      OT LONG TERM GOAL #1   Title  Patient will demonstrate improved balance to perform clothing negotiation with dressing/toileting with supervision only.     Baseline  min assist    Time  12    Period  Weeks    Status  New    Target Date  07/08/18      OT LONG TERM GOAL #2   Title  Patient will demonstrate ability to manage shoulder brace with modified independence.    Baseline  unable at eval    Time  4    Period  Weeks    Status  New    Target Date  05/13/18      OT LONG TERM GOAL #3   Title  Patient will demonstrate trace movement of elbow extension with facilitation to work towards neuromuscular reeducation for active arm use on the right .    Baseline  no active movement at eval    Time  12    Period  Weeks    Status  New    Target Date  07/08/18      OT LONG TERM GOAL #4   Title  Patient will demonstate understanding of compensatory strategies to protect right UE from harm during engagement  in daily activities.     Baseline  sensation absent in right UE and at risk for injury.    Time  3    Period  Weeks    Status  New    Target Date  05/06/18      OT LONG TERM GOAL #5    Title  Patient will complete cutting meat one handed with modified independence with adaptive equipment as needed.     Baseline  unable at eval    Time  12    Period  Weeks    Status  New    Target Date  07/08/18      Long Term Additional Goals   Additional Long Term Goals  Yes      OT  LONG TERM GOAL #6   Title  Patient will complete UB dressing with modified independence    Baseline  min assist     Time  12    Period  Weeks    Status  New    Target Date  07/08/18      OT LONG TERM GOAL #7   Title  Patient will complete shoe and sock donning with modified independence.    Baseline  unable at eval    Time  12    Period  Weeks    Status  New    Target Date  07/08/18      OT LONG TERM GOAL #8   Title  Patient will complete lower body dressing with occasional assist for buttons/zippers.     Baseline  min assist    Time  12    Period  Weeks    Status  New    Target Date  07/08/18            Plan - 06/24/18 1614    Clinical Impression Statement Pt. attended the session without his wife today, as his wife went to a dentist appointment. Pt. walked to the clinic independently from the front entrance. Pt. continues to present with fluctuating tone with increased extensor tone in the right elbow, and increased flexor tone in the right hand, and digits. Pt. continues to work towards normalizing tone, and eliciting consistent volitional movement.    Occupational Profile and client history currently impacting functional performance  absent sensation in RUE from shoulder to digits, no active right UE movement, decreased right peripheral vision, has 51 month old infant at home, recent move from 3rd story apartment, unable to work, bone flap replaced in 9/19    Occupational performance deficits (Please refer to evaluation for details):  ADL's;IADL's;Leisure;Work    Electrical engineer    Current Impairments/barriers affecting progress:  positive:  motivation, family support.   Negative:  young age, lack of sensation on the right for light touch, sharp/dull, deep pressure, hot/cold, processing delay cognitively    OT Frequency  2x / week    OT Duration  12 weeks    OT Treatment/Interventions  Self-care/ADL training;Therapeutic exercise;Neuromuscular education;Patient/family education;Building services engineer;Therapeutic activities;Balance training;DME and/or AE instruction;Manual Therapy;Passive range of motion    Clinical Decision Making  Several treatment options, min-mod task modification necessary    Consulted and Agree with Plan of Care  Patient;Family member/caregiver    Family Member Consulted  wife       Patient will benefit from skilled therapeutic intervention in order to improve the following deficits and impairments:  Abnormal gait, Decreased cognition, Decreased knowledge of use of DME, Increased edema, Pain, Decreased coordination, Decreased mobility, Impaired sensation, Decreased activity tolerance, Decreased endurance, Decreased range of motion, Decreased strength, Impaired tone, Decreased balance, Decreased knowledge of precautions, Decreased safety awareness, Difficulty walking, Impaired UE functional use  Visit Diagnosis: Muscle weakness (generalized)    Problem List Patient Active Problem List   Diagnosis Date Noted  . History of cranioplasty 03/19/2018  . PE (pulmonary thromboembolism) (HCC) 10/06/2017  . DVT (deep vein thrombosis) in pregnancy 10/06/2017  . Acute blood loss anemia   . Vascular headache   . Spastic hemiplegia of right dominant side as late effect of cerebral infarction (HCC)   . Benign essential HTN   . Frontal lobe and executive function deficit following cerebral infarction   . Adjustment disorder with mixed anxiety and depressed mood   .  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 Aphasia due to acute stroke (HCC) 08/07/2017  . B12 deficiency   . Acute respiratory failure (HCC)   . Central line  infiltration (HCC)   . Endotracheal tube present   . Fever   . Leukocytosis   . S/P craniotomy 07/30/2017  . Cerebral edema (HCC) 07/28/2017  . Hyperlipidemia 07/28/2017  . Stroke (cerebrum) (HCC) 07/27/2017    Olegario Messier, MS, OTR/L 06/24/2018, 4:25 PM  [place] [facility] MAIN [facility] SERVICES [address] [place], [place], [address] Phone: [phone]   Fax:  [phone]  Name: Effie Janoski MRN: [id number] Date of Birth: 12/17/1975

## 2018-06-24 NOTE — Therapy (Signed)
[facility] MAIN [facility] SERVICES [address] [place], [place], [address] Phone: [phone]   Fax:  [phone]  Physical Therapy Treatment  Patient Details  Name: Jeremy [name] MRN: [id number] Date of Birth: Aug 09, 1975 Referring Provider (PT): Dr. Claudette Laws   Encounter Date: 06/24/2018  PT End of Session - 06/24/18 1603    Visit Number  8    Number of Visits  33    Date for PT Re-Evaluation  07/31/18    Authorization Type  30 visits total for OT and PT combined     Authorization Time Period  8/15 PT visit    PT Start Time  1350    PT Stop Time  1428    PT Time Calculation (min)  38 min    Equipment Utilized During Treatment  Gait belt;Other (comment)   R AFO   Activity Tolerance  Patient tolerated treatment well    Behavior During Therapy  WFL for tasks assessed/performed       Past Medical History:  Diagnosis Date  . Asthma    MILD AS A CHILD  . Deviated septum   . Dyspnea    NORMAL SPIROMETRY 02/01/17 VISIT WITH DR HEDRICK  . Hypertension   . Sleep apnea   . Stroke [facility])     Past Surgical History:  Procedure Laterality Date  . CRANIOPLASTY N/A 03/19/2018   Procedure: CRANIOPLASTY;  Surgeon: Lisbeth Renshaw, MD;  Location: [facility] OR;  Service: Neurosurgery;  Laterality: N/A;  CRANIOPLASTY  . CRANIOTOMY Left 07/29/2017   Procedure: LEFT HEMI- CRANIECTOMY WITH PLACEMENT OF BONE FLAP IN ABDOMEN;  Surgeon: Lisbeth Renshaw, MD;  Location: [facility] OR;  Service: Neurosurgery;  Laterality: Left;  . IR ANGIO INTRA EXTRACRAN SEL COM CAROTID INNOMINATE UNI R MOD SED  07/28/2017  . IR ANGIO VERTEBRAL SEL VERTEBRAL UNI R MOD SED  07/28/2017  . IR CT HEAD LTD  07/28/2017  . IR PERCUTANEOUS ART THROMBECTOMY/INFUSION INTRACRANIAL INC DIAG ANGIO  07/28/2017  . NASAL SEPTOPLASTY W/ TURBINOPLASTY Bilateral 03/06/2017   Procedure: NASAL SEPTOPLASTY WITH TURBINATE REDUCTION;  Surgeon: Linus Salmons, MD;  Location: [facility] ORS;  Service: ENT;  Laterality:  Bilateral;  . RADIOLOGY WITH ANESTHESIA N/A 07/27/2017   Procedure: RADIOLOGY WITH ANESTHESIA;  Surgeon: Julieanne Cotton, MD;  Location: [facility] OR;  Service: Radiology;  Laterality: N/A;    There were no vitals filed for this visit.  Subjective Assessment - 06/24/18 1354    Subjective  Patient has no pain at start of session. New brace not ready until this Thursday. no complaints or questions at this time.     Pertinent History  Pt is a 42 y/o M who presented on Jul 27, 2017 with R sided weakness, headache, slurred speech. Cranial CT scan showed L insula and frontal operculum lucency, compatible with infarction. Underwent attempted mechanical thrombectomy per Interventional Radiology that was unsuccessful, required intubation. Follow-up MRI showed cerebellar edema, midline shift. Underwent L decompressive hemicraniectomy, placement of bone flap in abdominal pocket Jan 13th, 2019. Pt went to CIR at [facility] until 2/21 and then received HHPT until end of April, followed by therapy at this clinic, then OPPT at [facility]ono [facility]. Pt now ambulating with quad cane at all times, limited primarily to in home ambulation. Uses riding cart at store. Pt is now able to go up/down curb on his own. Wife assists with donning sock and shoe on R, shirt on the R. Wife assist bringing RLE into tub but pt shower  independently. Pt requires occasional assist with RLE for supine<>sit. Pt's goals: reduce L low back spasms (believe to be due to weight shift to L), walking longer, to get back to work, to be able to cook a full meal, improve his balance, to be able to carry something while walking. Pt had Botox injections Aug 2019 in RLE and RUE. Plan is for repeat on Nov 1st. Pt with bone flap surgery ~1 month ago, pt denies precautions with this. Pt with h/o DVT in LLE and small PE in March 2019.    Limitations  Standing;Walking;House hold activities;Lifting    Patient Stated Goals  see above    Currently in Pain?   No/denies        TREATMENT  Ther-ex  Seated marches 2 x 10 bilateral;  Seated clams with gentle manual resistance, focus on RLE Seated adductor squeeze with gentle manual resistance 2 x 15, focus on RLE Sit to stand from elevated table, weight shift to R facilitated by PT tactile/pushing, LLE forward to encourage more weight bearing through RLE Bridges 2x10 sidelying hip extension AAROM with PT for RLE sidelying knee extension/flexion attempted on RLE  Gait Training Gait training in rehab gym with Lite Gait without assistive device. Pt with notable instability initially but this improves somewhat with practice. Pt with improving weight acceptance to RLE. Intermittent RLE buckling. Total distance ambulated approximate 180'. Pt provided cues for stepping and posture as well as weight shifting to RLE, step through gait pattern for LLE.       PT Education - 06/24/18 1355    Education provided  Yes    Education Details  therex, gait    Person(s) Educated  Patient    Methods  Explanation    Comprehension  Verbalized understanding       PT Short Term Goals - 06/05/18 1310      PT SHORT TERM GOAL #1   Title  Pt will completed HEP at least 4 days/wk for improved carryover between sessions    Baseline  06/05/18: Pt has been completing his HEP 4 days/wk    Time  2    Period  Weeks    Status  Achieved        PT Long Term Goals - 06/05/18 1310      PT LONG TERM GOAL #1   Title  Pt's 10mWT will improve to equal or greater than 0.6 m/s for improved safety with ambulation in the home setting    Baseline  0.22 m/s; 06/05/18: self-selected: 42.2 = 0.24 m/s, fastest: 31.8s = 0.31 m/s    Time  8    Period  Weeks    Status  On-going    Target Date  07/31/18      PT LONG TERM GOAL #2   Title  Pt will be able to cook a full meal with min assist for improved QOL    Baseline  Pt able to perform simple meal prep (making sandwiches) but unable to cook full meal; 06/05/18: unchanged    Time   8    Period  Weeks    Status  On-going    Target Date  07/31/18      PT LONG TERM GOAL #3   Title  Pt's 6MWT will improve to at least 500 ft to demonstrate improved ambulatory endurance and speed    Baseline  210 ft; 06/05/18: 300'    Time  8    Period  Weeks    Status  On-going    Target Date  07/31/18      PT LONG TERM GOAL #4   Title  Pt will improve 5xSTS to equal or less than 15 seconds without UE support to demonstrate improved balance and strength    Baseline  20.15 sec with LUE assist; 06/05/18: 15.0s with LUE assist, 18.5s without UE assist;    Time  8    Period  Weeks    Status  On-going    Target Date  07/31/18      PT LONG TERM GOAL #5   Title  Pt will be able to carry objects household distances for improved independence with homemaking and cooking activities.    Baseline  Carries small objects in L hand along with quad cane; 06/05/18: still unable to carry anything in R arm    Time  8    Period  Weeks    Status  On-going    Target Date  07/31/18      Additional Long Term Goals   Additional Long Term Goals  Yes      PT LONG TERM GOAL #6   Title   Pt will decrease TUG by 4 seconds in order to demonstrate decreased fall risk     Baseline  06/05/18: 35.6 seconds    Time  8    Period  Weeks    Status  New    Target Date  07/31/18            Plan - 06/24/18 1601    Clinical Impression Statement  Pt able to ambulate with Lite gait without AD, cues to improve step through gait pattern for LLE. Intermittently increases circumduction of RLE, decreased with verbal cues. Pt most challenged by sit to stands to weight bear on RLE, educated and positioned to improve. The patient would benefit from further skilled PT intervention to continue to progress towards goals and to maximize strength, mobility, and independence.     Rehab Potential  Good    PT Frequency  2x / week    PT Duration  8 weeks    PT Treatment/Interventions  ADLs/Self Care Home Management;Aquatic  Therapy;Cryotherapy;Electrical Stimulation;Iontophoresis 4mg /ml Dexamethasone;Moist Heat;Ultrasound;DME Instruction;Gait training;Stair training;Functional mobility training;Therapeutic activities;Therapeutic exercise;Balance training;Neuromuscular re-education;Cognitive remediation;Patient/family education;Orthotic Fit/Training;Manual techniques;Scar mobilization;Passive range of motion;Dry needling;Energy conservation;Splinting;Taping;Visual/perceptual remediation/compensation    PT Next Visit Plan  weight shifting activities, standing HEP    PT Home Exercise Plan  marching in hooklying, supine hip abduction    Consulted and Agree with Plan of Care  Patient       Patient will benefit from skilled therapeutic intervention in order to improve the following deficits and impairments:  Abnormal gait, Decreased activity tolerance, Decreased balance, Decreased cognition, Decreased coordination, Decreased endurance, Decreased knowledge of precautions, Decreased knowledge of use of DME, Decreased mobility, Decreased range of motion, Decreased safety awareness, Decreased skin integrity, Decreased scar mobility, Decreased strength, Difficulty walking, Hypomobility, Hypermobility, Increased fascial restricitons, Increased muscle spasms, Impaired perceived functional ability, Impaired flexibility, Impaired sensation, Impaired tone, Impaired UE functional use, Impaired vision/preception, Improper body mechanics, Postural dysfunction, Pain  Visit Diagnosis: Muscle weakness (generalized)  Hemiplegia and hemiparesis following cerebral infarction affecting right non-dominant side [facility])     Problem List Patient Active Problem List   Diagnosis Date Noted  . History of cranioplasty 03/19/2018  . PE (pulmonary thromboembolism) (HCC) 10/06/2017  . DVT (deep vein thrombosis) in pregnancy 10/06/2017  . Acute blood loss anemia   . Vascular headache   . Spastic hemiplegia  of right dominant side as late effect of  cerebral infarction (HCC)   . Benign essential HTN   . Frontal lobe and executive function deficit following cerebral infarction   . Adjustment disorder with mixed anxiety and depressed mood   . 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 Aphasia due to acute stroke (HCC) 08/07/2017  . B12 deficiency   . Acute respiratory failure (HCC)   . Central line infiltration (HCC)   . Endotracheal tube present   . Fever   . Leukocytosis   . S/P craniotomy 07/30/2017  . Cerebral edema (HCC) 07/28/2017  . Hyperlipidemia 07/28/2017  . Stroke (cerebrum) [facility]) 07/27/2017   Olga Coaster PT, DPT [phone] PM,06/24/18 [phone]  [facility] MAIN [facility] SERVICES [address] [place], [place], [address] Phone: [phone]   Fax:  [phone]  Name: Jeremy [name] MRN: [id number] Date of Birth: 1976/04/14

## 2018-07-02 ENCOUNTER — Ambulatory Visit: Payer: BLUE CROSS/BLUE SHIELD | Admitting: Occupational Therapy

## 2018-07-02 ENCOUNTER — Ambulatory Visit: Payer: BLUE CROSS/BLUE SHIELD

## 2018-07-02 DIAGNOSIS — M6281 Muscle weakness (generalized): Secondary | ICD-10-CM

## 2018-07-02 DIAGNOSIS — I69353 Hemiplegia and hemiparesis following cerebral infarction affecting right non-dominant side: Secondary | ICD-10-CM

## 2018-07-02 DIAGNOSIS — R278 Other lack of coordination: Secondary | ICD-10-CM

## 2018-07-02 NOTE — Therapy (Signed)
[facility] MAIN [facility] SERVICES [address] [place], [place], [address] Phone: [phone]   Fax:  [phone]  Physical Therapy Treatment  Patient Details  Name: Jeremy [name] MRN: [id number] Date of Birth: 03-02-1976 Referring Provider (PT): Dr. Claudette Laws   Encounter Date: 07/02/2018  PT End of Session - 07/03/18 1045    Visit Number  9    Number of Visits  33    Date for PT Re-Evaluation  07/31/18    Authorization Type  30 visits total for OT and PT combined     Authorization Time Period  9/15 PT visit    PT Start Time  1347    PT Stop Time  1430    PT Time Calculation (min)  43 min    Equipment Utilized During Treatment  Gait belt;Other (comment)   R AFO   Activity Tolerance  Patient tolerated treatment well    Behavior During Therapy  WFL for tasks assessed/performed       Past Medical History:  Diagnosis Date  . Asthma    MILD AS A CHILD  . Deviated septum   . Dyspnea    NORMAL SPIROMETRY 02/01/17 VISIT WITH DR HEDRICK  . Hypertension   . Sleep apnea   . Stroke [facility])     Past Surgical History:  Procedure Laterality Date  . CRANIOPLASTY N/A 03/19/2018   Procedure: CRANIOPLASTY;  Surgeon: Lisbeth Renshaw, MD;  Location: [facility] OR;  Service: Neurosurgery;  Laterality: N/A;  CRANIOPLASTY  . CRANIOTOMY Left 07/29/2017   Procedure: LEFT HEMI- CRANIECTOMY WITH PLACEMENT OF BONE FLAP IN ABDOMEN;  Surgeon: Lisbeth Renshaw, MD;  Location: [facility] OR;  Service: Neurosurgery;  Laterality: Left;  . IR ANGIO INTRA EXTRACRAN SEL COM CAROTID INNOMINATE UNI R MOD SED  07/28/2017  . IR ANGIO VERTEBRAL SEL VERTEBRAL UNI R MOD SED  07/28/2017  . IR CT HEAD LTD  07/28/2017  . IR PERCUTANEOUS ART THROMBECTOMY/INFUSION INTRACRANIAL INC DIAG ANGIO  07/28/2017  . NASAL SEPTOPLASTY W/ TURBINOPLASTY Bilateral 03/06/2017   Procedure: NASAL SEPTOPLASTY WITH TURBINATE REDUCTION;  Surgeon: Linus Salmons, MD;  Location: [facility] ORS;  Service: ENT;  Laterality:  Bilateral;  . RADIOLOGY WITH ANESTHESIA N/A 07/27/2017   Procedure: RADIOLOGY WITH ANESTHESIA;  Surgeon: Julieanne Cotton, MD;  Location: [facility] OR;  Service: Radiology;  Laterality: N/A;    There were no vitals filed for this visit.  Subjective Assessment - 07/02/18 1351    Subjective  Patient has no pain at start of session. They did not have his brace ready last week and told him that it would be ready this Thursday instead. No complaints or questions at this time.     Pertinent History  Pt is a 42 y/o M who presented on Jul 27, 2017 with R sided weakness, headache, slurred speech. Cranial CT scan showed L insula and frontal operculum lucency, compatible with infarction. Underwent attempted mechanical thrombectomy per Interventional Radiology that was unsuccessful, required intubation. Follow-up MRI showed cerebellar edema, midline shift. Underwent L decompressive hemicraniectomy, placement of bone flap in abdominal pocket Jan 13th, 2019. Pt went to CIR at [facility] until 2/21 and then received HHPT until end of April, followed by therapy at this clinic, then OPPT at [facility] probono clinic. Pt now ambulating with quad cane at all times, limited primarily to in home ambulation. Uses riding cart at store. Pt is now able to go up/down curb on his own. Wife assists with donning sock and shoe on R,  shirt on the R. Wife assist bringing RLE into tub but pt shower independently. Pt requires occasional assist with RLE for supine<>sit. Pt's goals: reduce L low back spasms (believe to be due to weight shift to L), walking longer, to get back to work, to be able to cook a full meal, improve his balance, to be able to carry something while walking. Pt had Botox injections Aug 2019 in RLE and RUE. Plan is for repeat on Nov 1st. Pt with bone flap surgery ~1 month ago, pt denies precautions with this. Pt with h/o DVT in LLE and small PE in March 2019.    Limitations  Standing;Walking;House hold activities;Lifting     Patient Stated Goals  see above    Currently in Pain?  No/denies          TREATMENT   Ther-ex Supine R hip abduction x 10 with patient, education performed with wife about how she can perform at home with patient; Attempted L sidelying R hip abduction however pt is too weak to perform against gravity without severe attempts at compensation which are difficult to limit; Hooklying clams x 10, education again performed with wife regarding how she can perform this with patient at home as well as typical compensatory strategies to avoid;   Gait Training Gait training in rehab gym with Lite Gait without assistive device. Pt with notable improvement in stability since the last time this was performed with this therapist. Pt with improving weight acceptance to RLE but continues to demonstrate RLE hyperextension. Total distance ambulated approximately 150'. Pt provided cues for stepping and posture as well as weight shifting to RLE.  In // bars performed forward gait training without UE support and no LOB observed by patient. Therapist provided CGA. Cues for improved step length on LLE and improved weight acceptance to RLE. 4 lenghts performed followed by an additional 4 lengths of side stepping without UE support;   Pt educated throughout session about proper posture and technique with exercises. Improved exercise technique, movement at target joints, use of target muscles after min to mod verbal, visual, tactile cues.    Performed gait training in rehab gym again with Lite Gait without an assistive device. Improved stability since initiated a few sessions ago. Wife reports that pt has been practicing at home between the back of the couch and the wall. Wife and pt educated about how he can strengthen R hip abductors in supine, sidelying and hooklying position.Hewill benefit from PT services to address deficits in strength, balance, and mobility in order to return to full function at  home.                       PT Short Term Goals - 06/05/18 1310      PT SHORT TERM GOAL #1   Title  Pt will completed HEP at least 4 days/wk for improved carryover between sessions    Baseline  06/05/18: Pt has been completing his HEP 4 days/wk    Time  2    Period  Weeks    Status  Achieved        PT Long Term Goals - 06/05/18 1310      PT LONG TERM GOAL #1   Title  Pt's will improve to equal or greater than 0.6 m/s for improved safety with ambulation in the home setting    Baseline  0.22 m/s; 06/05/18: self-selected: 42.2 = 0.24 m/s, fastest: 31.8s = 0.31 m/s  Time  8    Period  Weeks    Status  On-going    Target Date  07/31/18      PT LONG TERM GOAL #2   Title  Pt will be able to cook a full meal with min assist for improved QOL    Baseline  Pt able to perform simple meal prep (making sandwiches) but unable to cook full meal; 06/05/18: unchanged    Time  8    Period  Weeks    Status  On-going    Target Date  07/31/18      PT LONG TERM GOAL #3   Title  Pt's will improve to at least 500 ft to demonstrate improved ambulatory endurance and speed    Baseline  210 ft; 06/05/18: 300'    Time  8    Period  Weeks    Status  On-going    Target Date  07/31/18      PT LONG TERM GOAL #4   Title  Pt will improve 5xSTS to equal or less than 15 seconds without UE support to demonstrate improved balance and strength    Baseline  20.15 sec with LUE assist; 06/05/18: 15.0s with LUE assist, 18.5s without UE assist;    Time  8    Period  Weeks    Status  On-going    Target Date  07/31/18      PT LONG TERM GOAL #5   Title  Pt will be able to carry objects household distances for improved independence with homemaking and cooking activities.    Baseline  Carries small objects in L hand along with quad cane; 06/05/18: still unable to carry anything in R arm    Time  8    Period  Weeks    Status  On-going    Target Date  07/31/18      Additional  Long Term Goals   Additional Long Term Goals  Yes      PT LONG TERM GOAL #6   Title   Pt will decrease TUG by 4 seconds in order to demonstrate decreased fall risk     Baseline  06/05/18: 35.6 seconds    Time  8    Period  Weeks    Status  New    Target Date  07/31/18            Plan - 07/03/18 1047    Clinical Impression Statement  Performed gait training in rehab gym again with Lite Gait without an assistive device. Improved stability since initiated a few sessions ago. Wife reports that pt has been practicing at home between the back of the couch and the wall. Wife and pt educated about how he can strengthen R hip abductors in supine, sidelying and hooklying position.Hewill benefit from PT services to address deficits in strength, balance, and mobility in order to return to full function at home.    Rehab Potential  Good    PT Frequency  2x / week    PT Duration  8 weeks    PT Treatment/Interventions  ADLs/Self Care Home Management;Aquatic Therapy;Cryotherapy;Electrical Stimulation;Iontophoresis 4mg /ml Dexamethasone;Moist Heat;Ultrasound;DME Instruction;Gait training;Stair training;Functional mobility training;Therapeutic activities;Therapeutic exercise;Balance training;Neuromuscular re-education;Cognitive remediation;Patient/family education;Orthotic Fit/Training;Manual techniques;Scar mobilization;Passive range of motion;Dry needling;Energy conservation;Splinting;Taping;Visual/perceptual remediation/compensation    PT Next Visit Plan  weight shifting activities, standing HEP    PT Home Exercise Plan  marching in hooklying, supine hip abduction    Consulted and Agree with Plan of Care  Patient  Patient will benefit from skilled therapeutic intervention in order to improve the following deficits and impairments:  Abnormal gait, Decreased activity tolerance, Decreased balance, Decreased cognition, Decreased coordination, Decreased endurance, Decreased knowledge of precautions,  Decreased knowledge of use of DME, Decreased mobility, Decreased range of motion, Decreased safety awareness, Decreased skin integrity, Decreased scar mobility, Decreased strength, Difficulty walking, Hypomobility, Hypermobility, Increased fascial restricitons, Increased muscle spasms, Impaired perceived functional ability, Impaired flexibility, Impaired sensation, Impaired tone, Impaired UE functional use, Impaired vision/preception, Improper body mechanics, Postural dysfunction, Pain  Visit Diagnosis: Muscle weakness (generalized)  Hemiplegia and hemiparesis following cerebral infarction affecting right non-dominant side [facility](HCC)     Problem List Patient Active Problem List   Diagnosis Date Noted  . History of cranioplasty 03/19/2018  . PE (pulmonary thromboembolism) (HCC) 10/06/2017  . DVT (deep vein thrombosis) in pregnancy 10/06/2017  . Acute blood loss anemia   . Vascular headache   . Spastic hemiplegia of right dominant side as late effect of cerebral infarction (HCC)   . Benign essential HTN   . Frontal lobe and executive function deficit following cerebral infarction   . Adjustment disorder with mixed anxiety and depressed mood   . 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 Aphasia due to acute stroke (HCC) 08/07/2017  . B12 deficiency   . Acute respiratory failure (HCC)   . Central line infiltration (HCC)   . Endotracheal tube present   . Fever   . Leukocytosis   . S/P craniotomy 07/30/2017  . Cerebral edema (HCC) 07/28/2017  . Hyperlipidemia 07/28/2017  . Stroke (cerebrum) (HCC) 07/27/2017   Lynnea MaizesJason D Huprich PT, DPT, GCS  Huprich,Jason 07/03/2018, 11:31 AM   [facility] [address] [place], [place], [address] Phone: [phone]   Fax:  [phone]  Name: Cletus GashJao Vanzee MRN: [id number] Date of Birth: [birth year]

## 2018-07-04 ENCOUNTER — Encounter: Payer: BLUE CROSS/BLUE SHIELD | Attending: Registered Nurse

## 2018-07-04 ENCOUNTER — Ambulatory Visit (HOSPITAL_BASED_OUTPATIENT_CLINIC_OR_DEPARTMENT_OTHER): Payer: BLUE CROSS/BLUE SHIELD | Admitting: Physical Medicine & Rehabilitation

## 2018-07-04 ENCOUNTER — Encounter: Payer: Self-pay | Admitting: Physical Medicine & Rehabilitation

## 2018-07-04 VITALS — BP 118/86 | HR 91 | Ht 71.0 in | Wt 200.0 lb

## 2018-07-04 DIAGNOSIS — I69351 Hemiplegia and hemiparesis following cerebral infarction affecting right dominant side: Secondary | ICD-10-CM | POA: Diagnosis not present

## 2018-07-04 DIAGNOSIS — I10 Essential (primary) hypertension: Secondary | ICD-10-CM | POA: Diagnosis not present

## 2018-07-04 DIAGNOSIS — G4730 Sleep apnea, unspecified: Secondary | ICD-10-CM | POA: Insufficient documentation

## 2018-07-04 DIAGNOSIS — I69314 Frontal lobe and executive function deficit following cerebral infarction: Secondary | ICD-10-CM

## 2018-07-04 DIAGNOSIS — I69319 Unspecified symptoms and signs involving cognitive functions following cerebral infarction: Secondary | ICD-10-CM | POA: Insufficient documentation

## 2018-07-04 DIAGNOSIS — F4320 Adjustment disorder, unspecified: Secondary | ICD-10-CM | POA: Diagnosis not present

## 2018-07-04 DIAGNOSIS — Z8249 Family history of ischemic heart disease and other diseases of the circulatory system: Secondary | ICD-10-CM | POA: Diagnosis not present

## 2018-07-04 NOTE — Progress Notes (Signed)
Subjective:    Patient ID: Jeremy [name], male    DOB: 1975-10-23, 42 y.o.   MRN: [id number]  HPI 05/23/18 Botox  Interval medical history received double metal upright AFO today.  Has not had much training with that yet.  Feels like it is a little bit more stable.  FPL 25 U FDS 25 U   Right posterior tibialis 75 units Right medial gastroc 75 units Right lateral gastroc 75 units Right medial soleus 75 units Right lateral soleus 50 units Pain Inventory Average Pain 0 Pain Right Now 0 My pain is no pain  In the last 24 hours, has pain interfered with the following? General activity 0 Relation with others 0 Enjoyment of life 0 What TIME of day is your pain at its worst? no pain Sleep (in general) Good  Pain is worse with: no pain Pain improves with: no pain Relief from Meds: no meds  Mobility walk with assistance use a cane how many minutes can you walk? 10 ability to climb steps?  yes do you drive?  no  Function disabled: date disabled n/a I need assistance with the following:  dressing, bathing, meal prep, household duties and shopping  Neuro/Psych trouble walking  Prior Studies Any changes since last visit?  no  Physicians involved in your care Any changes since last visit?  no   Family History  Problem Relation Age of Onset  . Hypertension Father   . Hypertension Brother    Social History   Socioeconomic History  . Marital status: Married    Spouse name: Not on file  . Number of children: Not on file  . Years of education: Not on file  . Highest education level: Not on file  Occupational History  . Not on file  Social Needs  . Financial resource strain: Not on file  . Food insecurity:    Worry: Not on file    Inability: Not on file  . Transportation needs:    Medical: Not on file    Non-medical: Not on file  Tobacco Use  . Smoking status: Never Smoker  . Smokeless tobacco: Never Used  Substance and Sexual Activity  . Alcohol use: Not  Currently    Comment: RARE  . Drug use: No  . Sexual activity: Not on file  Lifestyle  . Physical activity:    Days per week: Not on file    Minutes per session: Not on file  . Stress: Not on file  Relationships  . Social connections:    Talks on phone: Not on file    Gets together: Not on file    Attends religious service: Not on file    Active member of club or organization: Not on file    Attends meetings of clubs or organizations: Not on file    Relationship status: Not on file  Other Topics Concern  . Not on file  Social History Narrative  . Not on file   Past Surgical History:  Procedure Laterality Date  . CRANIOPLASTY N/A 03/19/2018   Procedure: CRANIOPLASTY;  Surgeon: Lisbeth Renshaw, MD;  Location: [facility] OR;  Service: Neurosurgery;  Laterality: N/A;  CRANIOPLASTY  . CRANIOTOMY Left 07/29/2017   Procedure: LEFT HEMI- CRANIECTOMY WITH PLACEMENT OF BONE FLAP IN ABDOMEN;  Surgeon: Lisbeth Renshaw, MD;  Location: [facility] OR;  Service: Neurosurgery;  Laterality: Left;  . IR ANGIO INTRA EXTRACRAN SEL COM CAROTID INNOMINATE UNI R MOD SED  07/28/2017  . IR ANGIO VERTEBRAL SEL VERTEBRAL UNI  R MOD SED  07/28/2017  . IR CT HEAD LTD  07/28/2017  . IR PERCUTANEOUS ART THROMBECTOMY/INFUSION INTRACRANIAL INC DIAG ANGIO  07/28/2017  . NASAL SEPTOPLASTY W/ TURBINOPLASTY Bilateral 03/06/2017   Procedure: NASAL SEPTOPLASTY WITH TURBINATE REDUCTION;  Surgeon: Linus SalmonsMcQueen, Chapman, MD;  Location: [facility] ORS;  Service: ENT;  Laterality: Bilateral;  . RADIOLOGY WITH ANESTHESIA N/A 07/27/2017   Procedure: RADIOLOGY WITH ANESTHESIA;  Surgeon: Julieanne Cottoneveshwar, Sanjeev, MD;  Location: [facility] OR;  Service: Radiology;  Laterality: N/A;   Past Medical History:  Diagnosis Date  . Asthma    MILD AS A CHILD  . Deviated septum   . Dyspnea    NORMAL SPIROMETRY 02/01/17 VISIT WITH DR HEDRICK  . Hypertension   . Sleep apnea   . Stroke (HCC)    BP 118/86   Pulse 91   Ht 5\' 11"  (1.803 m)   Wt 200 lb (90.7 kg)   BMI 27.89  kg/m   Opioid Risk Score:   Fall Risk Score:  `1  Depression screen PHQ 2/9  Depression screen PHQ 2/9 09/24/2017  Decreased Interest 2  Down, Depressed, Hopeless 1  PHQ - 2 Score 3  Altered sleeping 0  Tired, decreased energy 2  Change in appetite 2  Feeling bad or failure about yourself  1  Trouble concentrating 0  Moving slowly or fidgety/restless 0  Suicidal thoughts 0  PHQ-9 Score 8  Difficult doing work/chores Somewhat difficult    Review of Systems  Constitutional: Negative.   HENT: Negative.   Eyes: Negative.   Respiratory: Negative.   Cardiovascular: Negative.   Gastrointestinal: Negative.   Endocrine: Negative.   Genitourinary: Negative.   Musculoskeletal: Negative.   Skin: Positive for rash.  Allergic/Immunologic: Negative.   Neurological: Negative.   Hematological: Negative.   Psychiatric/Behavioral: Negative.   All other systems reviewed and are negative.      Objective:   Physical Exam Vitals signs and nursing note reviewed.  Constitutional:      Appearance: Normal appearance.  HENT:     Head: Normocephalic and atraumatic.     Mouth/Throat:     Mouth: Mucous membranes are moist.  Eyes:     Extraocular Movements: Extraocular movements intact.     Pupils: Pupils are equal, round, and reactive to light.  Skin:    General: Skin is warm and dry.  Neurological:     Mental Status: He is alert.   Motor strength is 0/5 in the right finger flexors and extensors, elbow flexors extensors are 0 to trace Shoulder is to minus abduction mainly with trapezius substitution Right lower extremity has 4- hip knee extensor synergy 3- at the hip flexor 0 at the ankle dorsiflexor plantar flexor except for foot inversion Tone MAS 0 at FPL MAS 2 at the DIP PIP of the second and third digits, MAS 0 at the fourth and fifth digits MAS 3 at the foot inverters Clonus at the right ankle        Assessment & Plan:  1.  Right spastic hemiplegia secondary to left MCA  distribution infarct During ambulation he still has significant knee hyperextension due to plantar flexor tone.  We discussed other treatment options besides increasing dose of Botox or switching to Dysport.  This included tibial nerve block with phenol.  We discussed advantages of this.  Given that he is going through physical therapy at the current time I think would be beneficial to do an injection sooner than 6 weeks.  We will schedule to  see him for tibial nerve block to allow him to engage with physical therapy for improved gait training.  If his finger flexor spasticity increases may do the Botox solely on the finger and thumb flexors will assess at the time we do the phenol block.  2.  Cognitive deficits related to stroke, he has a flat affect but also having some adjustment disorder.  In addition he has processing delays. I would like to send him to neuropsychology to have further evaluation of both his cognition as well as his adjustment disorder/mood disorder. We will make  adjustment to his botulinum toxin

## 2018-07-04 NOTE — Patient Instructions (Addendum)
We special order the Phenol a couple days prior to , please call us >48 hours before your appointment if we need to reschedule

## 2018-07-05 ENCOUNTER — Encounter: Payer: Self-pay | Admitting: Occupational Therapy

## 2018-07-05 NOTE — Therapy (Signed)
[place] [facility] MAIN [facility] SERVICES [address] [place], [place], [address] Phone: [phone]   Fax:  [phone]  Occupational Therapy Treatment  Patient Details  Name: Jeremy [name] MRN: [id number] Date of Birth: 1975/09/18 No data recorded  Encounter Date: 07/02/2018  OT End of Session - 07/05/18 1633    Visit Number  9    Number of Visits  24    Date for OT Re-Evaluation  07/08/18    Authorization Type  BCBS    Authorization Time Period  visit limit 30    OT Start Time  1305    OT Stop Time  1347    OT Time Calculation (min)  42 min    Activity Tolerance  Patient tolerated treatment well    Behavior During Therapy  [facility] for tasks assessed/performed       Past Medical History:  Diagnosis Date  . Asthma    MILD AS A CHILD  . Deviated septum   . Dyspnea    NORMAL SPIROMETRY 02/01/17 VISIT WITH DR HEDRICK  . Hypertension   . Sleep apnea   . Stroke [facility])     Past Surgical History:  Procedure Laterality Date  . CRANIOPLASTY N/A 03/19/2018   Procedure: CRANIOPLASTY;  Surgeon: Lisbeth Renshaw, MD;  Location: [facility] OR;  Service: Neurosurgery;  Laterality: N/A;  CRANIOPLASTY  . CRANIOTOMY Left 07/29/2017   Procedure: LEFT HEMI- CRANIECTOMY WITH PLACEMENT OF BONE FLAP IN ABDOMEN;  Surgeon: Lisbeth Renshaw, MD;  Location: [facility] OR;  Service: Neurosurgery;  Laterality: Left;  . IR ANGIO INTRA EXTRACRAN SEL COM CAROTID INNOMINATE UNI R MOD SED  07/28/2017  . IR ANGIO VERTEBRAL SEL VERTEBRAL UNI R MOD SED  07/28/2017  . IR CT HEAD LTD  07/28/2017  . IR PERCUTANEOUS ART THROMBECTOMY/INFUSION INTRACRANIAL INC DIAG ANGIO  07/28/2017  . NASAL SEPTOPLASTY W/ TURBINOPLASTY Bilateral 03/06/2017   Procedure: NASAL SEPTOPLASTY WITH TURBINATE REDUCTION;  Surgeon: Linus Salmons, MD;  Location: [facility] ORS;  Service: ENT;  Laterality: Bilateral;  . RADIOLOGY WITH ANESTHESIA N/A 07/27/2017   Procedure: RADIOLOGY WITH ANESTHESIA;  Surgeon: Julieanne Cotton, MD;   Location: [facility] OR;  Service: Radiology;  Laterality: N/A;    There were no vitals filed for this visit.  Subjective Assessment - 07/05/18 1632    Subjective   Patient more talkative today than he has ever been, actually initiating conversation with therapist without being asked direct questions.     Pertinent History  Pt is a 42 y/o M who presented on Jul 27, 2017 with R sided weakness, headache, slurred speech. Cranial CT scan showed L insula and frontal operculum lucency, compatible with infarction. Underwent attempted mechanical thrombectomy per Interventional Radiology that was unsuccessful, required intubation. Follow-up MRI showed cerebellar edema, midline shift. Underwent L decompressive hemicraniectomy, placement of bone flap in abdominal pocket Jan 13th, 2019. Pt went to CIR at [facility] until 2/21 and then received HHPT until end of April, followed by therapy at this clinic, then OPPT at [facility] probono clinic once his insurance benefits ran out.  Patient now has new insurance and has returned to therapy for OT evaluation.      Patient Stated Goals  Patient would like to regain ability to take care of self.    Currently in Pain?  No/denies    Pain Score  0-No pain          Manual Therapy:   Pt. seen for manual therapy techniques for scapular mobilizations in sitting elevation,  depression, abduction/adduction, upwards rotation.  Neuro muscular re-education:  Facilitation of scapular mobility for elevation, retraction and upwards rotation following manual therapy techniques.  Assist from therapist with guiding for movements and cues.   Attempts at facilitation of movement in elbow, forearm, wrist and hand however no active movement elicited.    Therapeutic Exercise:   Pt. tolerated PROM in all joint ranges of the RUE, and hand including shoulder flexion, abduction, horizontal abduction, elbow flexion, extension, forearm supination, wrist flexion, extension, and digit MP flexion,  and extension. Pt. Attempted shoulder stabilization exercises in supine with the shoulder flexed to 90 degrees with elbow extended without success.                   OT Education - 07/05/18 1633    Education provided  Yes    Education Details  RUE ROM, tone management    Person(s) Educated  Patient    Methods  Explanation;Demonstration;Verbal cues    Comprehension  Verbalized understanding;Returned demonstration          OT Long Term Goals - 04/15/18 1918      OT LONG TERM GOAL #1   Title  Patient will demonstrate improved balance to perform clothing negotiation with dressing/toileting with supervision only.     Baseline  min assist    Time  12    Period  Weeks    Status  New    Target Date  07/08/18      OT LONG TERM GOAL #2   Title  Patient will demonstrate ability to manage shoulder brace with modified independence.    Baseline  unable at eval    Time  4    Period  Weeks    Status  New    Target Date  05/13/18      OT LONG TERM GOAL #3   Title  Patient will demonstrate trace movement of elbow extension with facilitation to work towards neuromuscular reeducation for active arm use on the right .    Baseline  no active movement at eval    Time  12    Period  Weeks    Status  New    Target Date  07/08/18      OT LONG TERM GOAL #4   Title  Patient will demonstate understanding of compensatory strategies to protect right UE from harm during engagement  in daily activities.     Baseline  sensation absent in right UE and at risk for injury.    Time  3    Period  Weeks    Status  New    Target Date  05/06/18      OT LONG TERM GOAL #5   Title  Patient will complete cutting meat one handed with modified independence with adaptive equipment as needed.     Baseline  unable at eval    Time  12    Period  Weeks    Status  New    Target Date  07/08/18      Long Term Additional Goals   Additional Long Term Goals  Yes      OT LONG TERM GOAL #6   Title   Patient will complete UB dressing with modified independence    Baseline  min assist     Time  12    Period  Weeks    Status  New    Target Date  07/08/18      OT LONG TERM GOAL #7  Title  Patient will complete shoe and sock donning with modified independence.    Baseline  unable at eval    Time  12    Period  Weeks    Status  New    Target Date  07/08/18      OT LONG TERM GOAL #8   Title  Patient will complete lower body dressing with occasional assist for buttons/zippers.     Baseline  min assist    Time  12    Period  Weeks    Status  New    Target Date  07/08/18            Plan - 07/05/18 1635    Clinical Impression Statement  Patient interactive this date, initiating conversation today more than he ever has in therapy.  He is normally flat with affect but smiling a couple of times during session this date.  He continues to demonstrate spasticity in right UE and no active movement elicted in his distal forearm or hand.  Patient has been participating more in self care tasks at home and still requires assist with right shoe and AFO, he reports he is getting a new brace this week so we will address this more when he gets his new brace since it will be a different design.  Continue to work towards goals in POC to increase independence in daily tasks.     Occupational Profile and client history currently impacting functional performance  absent sensation in RUE from shoulder to digits, no active right UE movement, decreased right peripheral vision, has 296 month old infant at home, recent move from 3rd story apartment, unable to work, bone flap replaced in 9/19    Rehab Potential  Fair    Current Impairments/barriers affecting progress:  positive:  motivation, family support.  Negative:  young age, lack of sensation on the right for light touch, sharp/dull, deep pressure, hot/cold, processing delay cognitively    OT Frequency  2x / week    OT Duration  12 weeks    OT  Treatment/Interventions  Self-care/ADL training;Therapeutic exercise;Neuromuscular education;Patient/family education;Building services engineerunctional Mobility Training;Therapeutic activities;Balance training;DME and/or AE instruction;Manual Therapy;Passive range of motion    Consulted and Agree with Plan of Care  Patient;Family member/caregiver    Family Member Consulted  wife       Patient will benefit from skilled therapeutic intervention in order to improve the following deficits and impairments:  Abnormal gait, Decreased cognition, Decreased knowledge of use of DME, Increased edema, Pain, Decreased coordination, Decreased mobility, Impaired sensation, Decreased activity tolerance, Decreased endurance, Decreased range of motion, Decreased strength, Impaired tone, Decreased balance, Decreased knowledge of precautions, Decreased safety awareness, Difficulty walking, Impaired UE functional use  Visit Diagnosis: Muscle weakness (generalized)  Hemiplegia and hemiparesis following cerebral infarction affecting right non-dominant side (HCC)  Other lack of coordination    Problem List Patient Active Problem List   Diagnosis Date Noted  . History of cranioplasty 03/19/2018  . PE (pulmonary thromboembolism) (HCC) 10/06/2017  . DVT (deep vein thrombosis) in pregnancy 10/06/2017  . Acute blood loss anemia   . Vascular headache   . Spastic hemiplegia of right dominant side as late effect of cerebral infarction (HCC)   . Benign essential HTN   . Frontal lobe and executive function deficit following cerebral infarction   . Adjustment disorder with mixed anxiety and depressed mood   . 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 Aphasia due to acute stroke (HCC) 08/07/2017  .  B12 deficiency   . Acute respiratory failure (HCC)   . Central line infiltration (HCC)   . Endotracheal tube present   . Fever   . Leukocytosis   . S/P craniotomy 07/30/2017  . Cerebral edema (HCC) 07/28/2017  . Hyperlipidemia  07/28/2017  . Stroke (cerebrum) [facility](HCC) 07/27/2017   Jahkai Yandell T Cleo Villamizar, OTR/L, CLT  Lennix Rotundo 07/05/2018, 4:41 PM  [place] [facility] MAIN [facility] [address] [place], [place], [address] Phone: [phone]   Fax:  [phone]  Name: Cletus GashJao Nilsson MRN: [id number] Date of Birth: [birth year]

## 2018-07-08 ENCOUNTER — Ambulatory Visit: Payer: BLUE CROSS/BLUE SHIELD

## 2018-07-08 DIAGNOSIS — M6281 Muscle weakness (generalized): Secondary | ICD-10-CM | POA: Diagnosis not present

## 2018-07-08 DIAGNOSIS — R278 Other lack of coordination: Secondary | ICD-10-CM

## 2018-07-08 DIAGNOSIS — R2689 Other abnormalities of gait and mobility: Secondary | ICD-10-CM

## 2018-07-08 DIAGNOSIS — I69353 Hemiplegia and hemiparesis following cerebral infarction affecting right non-dominant side: Secondary | ICD-10-CM

## 2018-07-08 NOTE — Therapy (Signed)
[place] [facility] [address] [place], [place], [address] Phone: [phone]   Fax:  [phone]  Physical Therapy Treatment  Patient Details  Name: Jeremy [name] MRN: [id number] Date of Birth: April 27, 1976 Referring Provider (PT): Dr. Claudette LawsAndrew Kirsteins   Encounter Date: 07/08/2018  PT End of Session - 07/08/18 1235    Visit Number  10    Number of Visits  33    Date for PT Re-Evaluation  07/31/18    Authorization Type  30 visits total for OT and PT combined     Authorization Time Period  10/15 PT visit    PT Start Time  1114    PT Stop Time  1152    PT Time Calculation (min)  38 min    Equipment Utilized During Treatment  Gait belt;Other (comment)   R AFO   Activity Tolerance  Patient tolerated treatment well    Behavior During Therapy  WFL for tasks assessed/performed       Past Medical History:  Diagnosis Date  . Asthma    MILD AS A CHILD  . Deviated septum   . Dyspnea    NORMAL SPIROMETRY 02/01/17 VISIT WITH DR HEDRICK  . Hypertension   . Sleep apnea   . Stroke [facility](HCC)     Past Surgical History:  Procedure Laterality Date  . CRANIOPLASTY N/A 03/19/2018   Procedure: CRANIOPLASTY;  Surgeon: Lisbeth RenshawNundkumar, Neelesh, MD;  [facility];  Service: Neurosurgery;  Laterality: N/A;  CRANIOPLASTY  . CRANIOTOMY Left 07/29/2017   Procedure: LEFT HEMI- CRANIECTOMY WITH PLACEMENT OF BONE FLAP IN ABDOMEN;  Surgeon: Lisbeth RenshawNundkumar, Neelesh, MD;  Location: [facility] OR;  Service: Neurosurgery;  Laterality: Left;  . IR ANGIO INTRA EXTRACRAN SEL COM CAROTID INNOMINATE UNI R MOD SED  07/28/2017  . IR ANGIO VERTEBRAL SEL VERTEBRAL UNI R MOD SED  07/28/2017  . IR CT HEAD LTD  07/28/2017  . IR PERCUTANEOUS ART THROMBECTOMY/INFUSION INTRACRANIAL INC DIAG ANGIO  07/28/2017  . NASAL SEPTOPLASTY W/ TURBINOPLASTY Bilateral 03/06/2017   Procedure: NASAL SEPTOPLASTY WITH TURBINATE REDUCTION;  Surgeon: Linus SalmonsMcQueen, Chapman, MD;  Location: [facility] ORS;  Service: ENT;   Laterality: Bilateral;  . RADIOLOGY WITH ANESTHESIA N/A 07/27/2017   Procedure: RADIOLOGY WITH ANESTHESIA;  Surgeon: Julieanne Cottoneveshwar, Sanjeev, MD;  Location: [facility] OR;  Service: Radiology;  Laterality: N/A;    There were no vitals filed for this visit.  Subjective Assessment - 07/08/18 1233    Subjective  Patient arrived late to session today. Has a new orthotic he recieved last thursday. May be getting another round of botox.     Pertinent History  Pt is a 42 y/o M who presented on Jul 27, 2017 with R sided weakness, headache, slurred speech. Cranial CT scan showed L insula and frontal operculum lucency, compatible with infarction. Underwent attempted mechanical thrombectomy per Interventional Radiology that was unsuccessful, required intubation. Follow-up MRI showed cerebellar edema, midline shift. Underwent L decompressive hemicraniectomy, placement of bone flap in abdominal pocket Jan 13th, 2019. [facility] and then received HHPT until end of April, followed by therapy at this clinic, then [facility]. Pt now ambulating with quad cane at all times, limited primarily to in home ambulation. Uses riding cart at store. Pt is now able to go up/down curb on his own. Wife assists with donning sock and shoe on R, shirt on the R. Wife assist bringing RLE into tub but pt shower independently.  Pt requires occasional assist with RLE for supine<>sit. Pt's goals: reduce L low back spasms (believe to be due to weight shift to L), walking longer, to get back to work, to be able to cook a full meal, improve his balance, to be able to carry something while walking. Pt had Botox injections Aug 2019 in RLE and RUE. Plan is for repeat on Nov 1st. Pt with bone flap surgery ~1 month ago, pt denies precautions with this. Pt with h/o DVT in LLE and small PE in March 2019.    Limitations  Standing;Walking;House hold activities;Lifting    Patient Stated Goals  see above    Currently  in Pain?  No/denies       May be getting botox?  TREATMENT   Got new orthotics last Thursday. Might be getting new botox in another 6 weeks.   Ther-ex  Seated marches 2 x 10 bilateral; with cues for hitting balloon with knee Supine: adductor squeeze with gentle manual resistance 2 x 15, focus on RLE Supine: short arc quad RLE with tactile activation 10x  Sit to stand from elevated table, weight shift to R facilitated by PT tactile/, LLE forward and on green board to encourage more weight bearing through RLE Static stand with LLE on green theraboard to weight shift to right, shooting balls into basketball hoop reaching inside and outside BOS x5 minutes Bridges 2x10 sidelying hip extension AAROM with PT for RLE 10x sidelying hip flexor stretch 2x30 seconds  Sidelying: clamshells RLE 10x                            PT Education - 07/08/18 1234    Education provided  Yes    Education Details  gait mechanics, exercise technique     Person(s) Educated  Patient    Methods  Explanation;Demonstration;Verbal cues;Tactile cues    Comprehension  Verbalized understanding;Returned demonstration;Tactile cues required;Need further instruction;Verbal cues required       PT Short Term Goals - 06/05/18 1310      PT SHORT TERM GOAL #1   Title  Pt will completed HEP at least 4 days/wk for improved carryover between sessions    Baseline  06/05/18: Pt has been completing his HEP 4 days/wk    Time  2    Period  Weeks    Status  Achieved        PT Long Term Goals - 06/05/18 1310      PT LONG TERM GOAL #1   Title  Pt's 10mWT will improve to equal or greater than 0.6 m/s for improved safety with ambulation in the home setting    Baseline  0.22 m/s; 06/05/18: self-selected: 42.2 = 0.24 m/s, fastest: 31.8s = 0.31 m/s    Time  8    Period  Weeks    Status  On-going    Target Date  07/31/18      PT LONG TERM GOAL #2   Title  Pt will be able to cook a full meal with min  assist for improved QOL    Baseline  Pt able to perform simple meal prep (making sandwiches) but unable to cook full meal; 06/05/18: unchanged    Time  8    Period  Weeks    Status  On-going    Target Date  07/31/18      PT LONG TERM GOAL #3   Title  Pt's 6MWT will improve to at least 500  ft to demonstrate improved ambulatory endurance and speed    Baseline  210 ft; 06/05/18: 300'    Time  8    Period  Weeks    Status  On-going    Target Date  07/31/18      PT LONG TERM GOAL #4   Title  Pt will improve 5xSTS to equal or less than 15 seconds without UE support to demonstrate improved balance and strength    Baseline  20.15 sec with LUE assist; 06/05/18: 15.0s with LUE assist, 18.5s without UE assist;    Time  8    Period  Weeks    Status  On-going    Target Date  07/31/18      PT LONG TERM GOAL #5   Title  Pt will be able to carry objects household distances for improved independence with homemaking and cooking activities.    Baseline  Carries small objects in L hand along with quad cane; 06/05/18: still unable to carry anything in R arm    Time  8    Period  Weeks    Status  On-going    Target Date  07/31/18      Additional Long Term Goals   Additional Long Term Goals  Yes      PT LONG TERM GOAL #6   Title   Pt will decrease TUG by 4 seconds in order to demonstrate decreased fall risk     Baseline  06/05/18: 35.6 seconds    Time  8    Period  Weeks    Status  New    Target Date  07/31/18            Plan - 07/08/18 1237    Clinical Impression Statement  Patient arrived late limiting session duration of session. Patient challenged with muscle activation of RLE in coordinated and prolonged contraction patterning.Pt most challenged by sit to stands to weight bear on RLE, educated and positioned to improve Hewill benefit from PT services to address deficits in strength, balance, and mobility in order to return to full function at home.    Rehab Potential  Good    PT  Frequency  2x / week    PT Duration  8 weeks    PT Treatment/Interventions  ADLs/Self Care Home Management;Aquatic Therapy;Cryotherapy;Electrical Stimulation;Iontophoresis 4mg /ml Dexamethasone;Moist Heat;Ultrasound;DME Instruction;Gait training;Stair training;Functional mobility training;Therapeutic activities;Therapeutic exercise;Balance training;Neuromuscular re-education;Cognitive remediation;Patient/family education;Orthotic Fit/Training;Manual techniques;Scar mobilization;Passive range of motion;Dry needling;Energy conservation;Splinting;Taping;Visual/perceptual remediation/compensation    PT Next Visit Plan  weight shifting activities, standing HEP    PT Home Exercise Plan  marching in hooklying, supine hip abduction    Consulted and Agree with Plan of Care  Patient       Patient will benefit from skilled therapeutic intervention in order to improve the following deficits and impairments:  Abnormal gait, Decreased activity tolerance, Decreased balance, Decreased cognition, Decreased coordination, Decreased endurance, Decreased knowledge of precautions, Decreased knowledge of use of DME, Decreased mobility, Decreased range of motion, Decreased safety awareness, Decreased skin integrity, Decreased scar mobility, Decreased strength, Difficulty walking, Hypomobility, Hypermobility, Increased fascial restricitons, Increased muscle spasms, Impaired perceived functional ability, Impaired flexibility, Impaired sensation, Impaired tone, Impaired UE functional use, Impaired vision/preception, Improper body mechanics, Postural dysfunction, Pain  Visit Diagnosis: Muscle weakness (generalized)  Hemiplegia and hemiparesis following cerebral infarction affecting right non-dominant side (HCC)  Other lack of coordination  Other abnormalities of gait and mobility     Problem List Patient Active Problem List   Diagnosis Date Noted  .  History of cranioplasty 03/19/2018  . PE (pulmonary thromboembolism)  (HCC) 10/06/2017  . DVT (deep vein thrombosis) in pregnancy 10/06/2017  . Acute blood loss anemia   . Vascular headache   . Spastic hemiplegia of right dominant side as late effect of cerebral infarction (HCC)   . Benign essential HTN   . Frontal lobe and executive function deficit following cerebral infarction   . Adjustment disorder with mixed anxiety and depressed mood   . 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 Aphasia due to acute stroke (HCC) 08/07/2017  . B12 deficiency   . Acute respiratory failure (HCC)   . Central line infiltration (HCC)   . Endotracheal tube present   . Fever   . Leukocytosis   . S/P craniotomy 07/30/2017  . Cerebral edema (HCC) 07/28/2017  . Hyperlipidemia 07/28/2017  . Stroke (cerebrum) (HCC) 07/27/2017   Precious Bard, PT, DPT   07/08/2018, 12:38 PM  [place] [facility] MAIN [facility] SERVICES [address] [place], [place], [address] Phone: [phone]   Fax:  [phone]  Name: Jeremy [name] MRN: [id number] Date of Birth: 1976-01-28

## 2018-07-16 ENCOUNTER — Encounter: Payer: Self-pay | Admitting: Occupational Therapy

## 2018-07-16 ENCOUNTER — Ambulatory Visit: Payer: BLUE CROSS/BLUE SHIELD

## 2018-07-16 ENCOUNTER — Ambulatory Visit: Payer: BLUE CROSS/BLUE SHIELD | Admitting: Occupational Therapy

## 2018-07-16 DIAGNOSIS — M6281 Muscle weakness (generalized): Secondary | ICD-10-CM | POA: Diagnosis not present

## 2018-07-16 DIAGNOSIS — R278 Other lack of coordination: Secondary | ICD-10-CM

## 2018-07-16 DIAGNOSIS — I69353 Hemiplegia and hemiparesis following cerebral infarction affecting right non-dominant side: Secondary | ICD-10-CM

## 2018-07-16 NOTE — Therapy (Addendum)
[place] MAIN [facility] SERVICES [address] [place], [place], [address] Phone: [phone]   Fax:  [phone]  Occupational Therapy Treatment/Recertification  Patient Details  Name: Jeremy [name] MRN: [id number] Date of Birth: 08-11-1975 No data recorded  Encounter Date: 07/16/2018  OT End of Session - 07/16/18 1738    Visit Number  10    Number of Visits  24    Date for OT Re-Evaluation  10/01/2018    Authorization Type  [employer]    Authorization Time Period  visit limit 30    OT Start Time  1304    OT Stop Time  1345    OT Time Calculation (min)  41 min    Activity Tolerance  Patient tolerated treatment well    Behavior During Therapy  [facility] for tasks assessed/performed       Past Medical History:  Diagnosis Date  . Asthma    MILD AS A CHILD  . Deviated septum   . Dyspnea    NORMAL SPIROMETRY 02/01/17 VISIT WITH DR Forest  . Hypertension   . Sleep apnea   . Stroke [facility])     Past Surgical History:  Procedure Laterality Date  . CRANIOPLASTY N/A 03/19/2018   Procedure: CRANIOPLASTY;  Surgeon: Consuella Lose, MD;  Location: [place];  Service: Neurosurgery;  Laterality: N/A;  CRANIOPLASTY  . CRANIOTOMY Left 07/29/2017   Procedure: LEFT HEMI- CRANIECTOMY WITH PLACEMENT OF BONE FLAP IN ABDOMEN;  Surgeon: Consuella Lose, MD;  Location: [place];  Service: Neurosurgery;  Laterality: Left;  . IR ANGIO INTRA EXTRACRAN SEL COM CAROTID INNOMINATE UNI R MOD SED  07/28/2017  . IR ANGIO VERTEBRAL SEL VERTEBRAL UNI R MOD SED  07/28/2017  . IR CT HEAD LTD  07/28/2017  . IR PERCUTANEOUS ART THROMBECTOMY/INFUSION INTRACRANIAL INC DIAG ANGIO  07/28/2017  . NASAL SEPTOPLASTY W/ TURBINOPLASTY Bilateral 03/06/2017   Procedure: NASAL SEPTOPLASTY WITH TURBINATE REDUCTION;  Surgeon: Beverly Gust, MD;  Location: [facility];  Service: ENT;  Laterality: Bilateral;  . RADIOLOGY WITH ANESTHESIA N/A 07/27/2017   Procedure: RADIOLOGY WITH ANESTHESIA;  Surgeon: Luanne Bras, MD;  Location: [place];  Service: Radiology;  Laterality: N/A;    There were no vitals filed for this visit.  Subjective Assessment - 07/16/18 1737    Subjective   Patient reports he got his new brace for his right shoe. Wife reports this brace and shoe are more difficult to don than the last one. Patient reports it slightly easier to walk with the new brace. Wife is concerned because a couple of times his foot has come out of the shoe when he is getting up from the couch.     Patient is accompained by:  Family member    Pertinent History  Pt is a 42 y/o M who presented on Jul 27, 2017 with R sided weakness, headache, slurred speech. Cranial CT scan showed L insula and frontal operculum lucency, compatible with infarction. Underwent attempted mechanical thrombectomy per Interventional Radiology that was unsuccessful, required intubation. Follow-up MRI showed cerebellar edema, midline shift. Underwent L decompressive hemicraniectomy, placement of bone flap in abdominal pocket Jan 13th, 2019. Pt went to CIR at [facility] until 2/21 and then received HHPT until end of April, followed by therapy at this clinic, then OPPT at [place] [facility] once his insurance benefits ran out.  Patient now has new insurance and has returned to therapy for OT evaluation.      Patient Stated Goals  Patient would like to regain ability to take care of self.    Currently in Pain?  Yes    Pain Score  4     Pain Location  Shoulder    Pain Orientation  Right    Pain Descriptors / Indicators  Aching    Pain Onset  More than a month ago    Pain Frequency  Intermittent    Multiple Pain Sites  No        Patient seen for donning and doffing new right foot brace with hinge into the shoe base.  Attempted multiple techniques for patient  To be able to don with modified techniques and use of equipment however he continues to require max assist.  Right heel tends  To sit up of the shoe more on the right which  may be due to spasticity and decreased ROM for dorsiflexion.   Adjusted laces to include upper hole to give more ankle support when wearing. Multiple trials of donning with instruction to wife on techniques.  In sitting it is difficult to put on shoe and then try to put brace into  Slots.  If brace remains in shoe then it is more difficult to get heel in therefore recommend technique using a shoehorn with leg extended out Then bend at the knee and then push through heel.  Patient unable to keep right leg crossed over left to place shoe on with left hand, requires left hand to hold leg in place.    PROM to RUE for shoulder flexion, ABD, ADD, elbow flexion/extension, supination, pronation, wrist and digits. No active movement noted during exercises or attempts At facilitation.      Patient seen for reassessment for self care: see above for LB dressing with management of brace and shoe. Patient continues to require assist with managing buttons and zippers on UB clothing, min assist with bathing. Min assist with LB dressing skills.  RUE with little to no active movement despite attempts at facilitation.  Goals updated to reflect current progress, see below             OT Education - 07/16/18 1737    Education provided  Yes    Education Details  donning and doffing of new brace and shoe    Person(s) Educated  Patient    Methods  Explanation;Demonstration;Verbal cues    Comprehension  Verbalized understanding;Returned demonstration          OT Long Term Goals - 04/15/18 1918      OT LONG TERM GOAL #1   Title  Patient will demonstrate improved balance to perform clothing negotiation with dressing/toileting with supervision only.     Baseline  min assist 07/16/2018   Time  12    Period  Weeks    Status  ongoing   Target Date  10/01/2018     OT LONG TERM GOAL #2   Title  Patient will demonstrate ability to manage shoulder brace with modified independence.    Baseline   unable at eval , can assist minimally with task 07/16/18   Time  4    Period  Weeks    Status  partially met   Target Date  10/01/2018     OT LONG TERM GOAL #3   Title  Patient will demonstrate trace movement of elbow extension with facilitation to work towards neuromuscular reeducation for active arm use on the right .    Baseline  no active movement at eval, no active movement  07/16/18   Time  12    Period  Weeks    Status  ongoing   Target Date  10/01/2018     OT LONG TERM GOAL #4   Title  Patient will demonstate understanding of compensatory strategies to protect right UE from harm during engagement  in daily activities.     Baseline  sensation absent in right UE and at risk for injury, pt can state but still has difficulty with awareness in functional tasks 07/16/18    Time  3    Period  Weeks    Status  ongoing   Target Date  10/01/2018      OT LONG TERM GOAL #5   Title  Patient will complete cutting meat one handed with modified independence with adaptive equipment as needed.     Baseline  unable at eval, continued difficulty 07/16/2018   Time  12    Period  Weeks    Status  ongoing   Target Date  10/01/2018      Long Term Additional Goals   Additional Long Term Goals  Yes      OT LONG TERM GOAL #6   Title  Patient will complete UB dressing with modified independence    Baseline  min assist , improved with pullover, assist with zipper 07/16/18   Time  12    Period  Weeks    Status  ongoing   Target Date  10/01/2018     OT LONG TERM GOAL #7   Title  Patient will complete shoe and sock donning with modified independence.    Baseline  unable at eval , has new brace and increased difficulty 07/16/18   Time  12    Period  Weeks    Status  ongoing   Target Date  10/01/2018      OT LONG TERM GOAL #8   Title  Patient will complete lower body dressing with occasional assist for buttons/zippers.     Baseline  min assist, cont min assist 07/16/18   Time  12    Period   Weeks    Status  ongoing   Target Date  10/01/2018            Plan - 07/16/18 1738    Clinical Impression Statement  Patient with new hinged brace for right lower extremity. He requires max assist to don the brace but able to demonstrate doffing the shoe/brace with modified independence. Wife is concerned since his foot has come out of the shoe and brace a couple of times getting up from the couch. Adjusted laces and laced higher to give more support to ankle. Will have PT to look at brace and see if adjustments need to be made. Wife was instructed on ways to assist with donning shoe in brace using a shoehorn to assist with heel area. Patient continues to be more interactive verbally in the session and smiling a few times. Patient continues to benefit from skilled OT services to address the above impairments.  Will Continue to work towards goals and plan of care to maximize safety and independence in daily tasks.    Occupational Profile and client history currently impacting functional performance  absent sensation in RUE from shoulder to digits, no active right UE movement, decreased right peripheral vision, has 13 month old infant at home, recent move from 3rd story apartment, unable to work, bone flap replaced in 9/19    Occupational performance deficits (Please refer to evaluation for  details):  ADL's;IADL's;Leisure;Work    Physicist, medical    Current Impairments/barriers affecting progress:  positive:  motivation, family support.  Negative:  young age, lack of sensation on the right for light touch, sharp/dull, deep pressure, hot/cold, processing delay cognitively    OT Frequency  2x / week    OT Duration  12 weeks    OT Treatment/Interventions  Self-care/ADL training;Therapeutic exercise;Neuromuscular education;Patient/family education;Therapist, nutritional;Therapeutic activities;Balance training;DME and/or AE instruction;Manual Therapy;Passive range of motion    Consulted and  Agree with Plan of Care  Patient;Family member/caregiver    Family Member Consulted  wife       Patient will benefit from skilled therapeutic intervention in order to improve the following deficits and impairments:  Abnormal gait, Decreased cognition, Decreased knowledge of use of DME, Increased edema, Pain, Decreased coordination, Decreased mobility, Impaired sensation, Decreased activity tolerance, Decreased endurance, Decreased range of motion, Decreased strength, Impaired tone, Decreased balance, Decreased knowledge of precautions, Decreased safety awareness, Difficulty walking, Impaired UE functional use  Visit Diagnosis: Muscle weakness (generalized)  Hemiplegia and hemiparesis following cerebral infarction affecting right non-dominant side (HCC)  Other lack of coordination    Problem List Patient Active Problem List   Diagnosis Date Noted  . History of cranioplasty 03/19/2018  . PE (pulmonary thromboembolism) ([place]) 10/06/2017  . DVT (deep vein thrombosis) in pregnancy 10/06/2017  . Acute blood loss anemia   . Vascular headache   . Spastic hemiplegia of right dominant side as late effect of cerebral infarction ([place])   . Benign essential HTN   . Frontal lobe and executive function deficit following cerebral infarction   . Adjustment disorder with mixed anxiety and depressed mood   . Left middle cerebral artery stroke ([place]) 08/07/2017  . Aphasia due to acute stroke ([place]) 08/07/2017  . B12 deficiency   . Acute respiratory failure ([place])   . Central line infiltration ([place])   . Endotracheal tube present   . Fever   . Leukocytosis   . S/P craniotomy 07/30/2017  . Cerebral edema (HCC) 07/28/2017  . Hyperlipidemia 07/28/2017  . Stroke (cerebrum) ([place]) 07/27/2017   Amy T Lovett, OTR/L, CLT  Lovett,Amy 07/16/2018, 5:40 PM  [place] MAIN [facility] SERVICES [address] [place], [place], [address] Phone: [phone]   Fax:   [phone]  Name: Jeremy [name] MRN: [id number] Date of Birth: 1975/10/03

## 2018-07-17 NOTE — Therapy (Signed)
[facility] MAIN [facility] SERVICES [address] [place], [place], [address] Phone: [phone]   Fax:  [phone]  Physical Therapy Treatment  Patient Details  Name: Jeremy [name] MRN: [id number] Date of Birth: 1975/09/02 Referring Provider (PT): Dr. Claudette Laws   Encounter Date: 07/16/2018  PT End of Session - 07/17/18 1409    Visit Number  11    Number of Visits  33    Date for PT Re-Evaluation  07/31/18    Authorization Type  30 visits total for OT and PT combined     Authorization Time Period  11/15 PT visit    PT Start Time  1347    PT Stop Time  1430    PT Time Calculation (min)  43 min    Equipment Utilized During Treatment  Gait belt   R AFO   Activity Tolerance  Patient tolerated treatment well    Behavior During Therapy  WFL for tasks assessed/performed       Past Medical History:  Diagnosis Date  . Asthma    MILD AS A CHILD  . Deviated septum   . Dyspnea    NORMAL SPIROMETRY 02/01/17 VISIT WITH DR HEDRICK  . Hypertension   . Sleep apnea   . Stroke [facility])     Past Surgical History:  Procedure Laterality Date  . CRANIOPLASTY N/A 03/19/2018   Procedure: CRANIOPLASTY;  Surgeon: Lisbeth Renshaw, MD;  Location: [facility] OR;  Service: Neurosurgery;  Laterality: N/A;  CRANIOPLASTY  . CRANIOTOMY Left 07/29/2017   Procedure: LEFT HEMI- CRANIECTOMY WITH PLACEMENT OF BONE FLAP IN ABDOMEN;  Surgeon: Lisbeth Renshaw, MD;  Location: [facility] OR;  Service: Neurosurgery;  Laterality: Left;  . IR ANGIO INTRA EXTRACRAN SEL COM CAROTID INNOMINATE UNI R MOD SED  07/28/2017  . IR ANGIO VERTEBRAL SEL VERTEBRAL UNI R MOD SED  07/28/2017  . IR CT HEAD LTD  07/28/2017  . IR PERCUTANEOUS ART THROMBECTOMY/INFUSION INTRACRANIAL INC DIAG ANGIO  07/28/2017  . NASAL SEPTOPLASTY W/ TURBINOPLASTY Bilateral 03/06/2017   Procedure: NASAL SEPTOPLASTY WITH TURBINATE REDUCTION;  Surgeon: Linus Salmons, MD;  Location: [facility] ORS;  Service: ENT;  Laterality: Bilateral;   . RADIOLOGY WITH ANESTHESIA N/A 07/27/2017   Procedure: RADIOLOGY WITH ANESTHESIA;  Surgeon: Julieanne Cotton, MD;  Location: [facility] OR;  Service: Radiology;  Laterality: N/A;    There were no vitals filed for this visit.  Subjective Assessment - 07/17/18 1408    Subjective  Pt is doing well today without any reported pain. He has his new metal articulating AFO and both pt and wife report that it is hard for them to don and doff. Wife also reports that it has been sliding off when pt gets up from the couch. She is concerned that it is going to cause him to fall.     Pertinent History  Pt is a 43 y/o M who presented on Jul 27, 2017 with R sided weakness, headache, slurred speech. Cranial CT scan showed L insula and frontal operculum lucency, compatible with infarction. Underwent attempted mechanical thrombectomy per Interventional Radiology that was unsuccessful, required intubation. Follow-up MRI showed cerebellar edema, midline shift. Underwent L decompressive hemicraniectomy, placement of bone flap in abdominal pocket Jan 13th, 2019. Pt went to CIR at [facility] until 2/21 and then received HHPT until end of April, followed by therapy at this clinic, then OPPT at [facility]. Pt now ambulating with quad cane at all times, limited primarily to in home ambulation. Uses  riding cart at store. Pt is now able to go up/down curb on his own. Wife assists with donning sock and shoe on R, shirt on the R. Wife assist bringing RLE into tub but pt shower independently. Pt requires occasional assist with RLE for supine<>sit. Pt's goals: reduce L low back spasms (believe to be due to weight shift to L), walking longer, to get back to work, to be able to cook a full meal, improve his balance, to be able to carry something while walking. Pt had Botox injections Aug 2019 in RLE and RUE. Plan is for repeat on Nov 1st. Pt with bone flap surgery ~1 month ago, pt denies precautions with this. Pt with h/o DVT in  LLE and small PE in March 2019.    Limitations  Standing;Walking;House hold activities;Lifting    Patient Stated Goals  see above    Currently in Pain?  No/denies         TREATMENT   Neuromuscular Re-education  Spent the session discussing and practicing strategies for donning/doffing as well as gait training with new metal articulating AFO. Pt and wife report that his new AFO is difficult to put on. In addition when pt stands up from couch his heel slips out of his shoe and his wife is concerned about it causing him to fall. Upon assessment pt has very high tone in his R calf muscle with limited dorsiflexion range of motion. He can move to approximately neutral. Significant clonus also present with quick stretch. Pt also with considerable medial/lateral ankle stability. It was determined with practice that the best way to don his brace is to remove his brace from his shoe, don his shoe, and then reattach the brace. In addition some minor adjustments were made to decrease the spring tension on the dorsiflexion assist. Practiced gait with spring tension adjusted and continued to adjust slightly. Unfortunately with the dorsiflexion assist loosened it did increase his knee hyperextension slightly however it was much safer with transfers and ambulation. Discussed the possible need for high top tennis shoes to prevent his heel from coming out of the shoe. In addition discussed dorsiflexion stretch and pt provided education/demonstration about how to perform. Pt performed right ankle soleus stretch for 30s to demonstrate proper form. Discussed night splints and low load long duration stretches with patient and wife. Pt had PODUS boots initially however his feet would always slide out of them. Pt and wife provided additional options for stretching appliances.    Pt educated throughout session about proper posture and technique with exercises. Improved exercise technique, movement at target joints, use  of target muscles after min to mod verbal, visual, tactile cues.                         PT Short Term Goals - 06/05/18 1310      PT SHORT TERM GOAL #1   Title  Pt will completed HEP at least 4 days/wk for improved carryover between sessions    Baseline  06/05/18: Pt has been completing his HEP 4 days/wk    Time  2    Period  Weeks    Status  Achieved        PT Long Term Goals - 06/05/18 1310      PT LONG TERM GOAL #1   Title  Pt's 10mWT will improve to equal or greater than 0.6 m/s for improved safety with ambulation in the home setting    Baseline  0.22 m/s; 06/05/18: self-selected: 42.2 = 0.24 m/s, fastest: 31.8s = 0.31 m/s    Time  8    Period  Weeks    Status  On-going    Target Date  07/31/18      PT LONG TERM GOAL #2   Title  Pt will be able to cook a full meal with min assist for improved QOL    Baseline  Pt able to perform simple meal prep (making sandwiches) but unable to cook full meal; 06/05/18: unchanged    Time  8    Period  Weeks    Status  On-going    Target Date  07/31/18      PT LONG TERM GOAL #3   Title  Pt's 6MWT will improve to at least 500 ft to demonstrate improved ambulatory endurance and speed    Baseline  210 ft; 06/05/18: 300'    Time  8    Period  Weeks    Status  On-going    Target Date  07/31/18      PT LONG TERM GOAL #4   Title  Pt will improve 5xSTS to equal or less than 15 seconds without UE support to demonstrate improved balance and strength    Baseline  20.15 sec with LUE assist; 06/05/18: 15.0s with LUE assist, 18.5s without UE assist;    Time  8    Period  Weeks    Status  On-going    Target Date  07/31/18      PT LONG TERM GOAL #5   Title  Pt will be able to carry objects household distances for improved independence with homemaking and cooking activities.    Baseline  Carries small objects in L hand along with quad cane; 06/05/18: still unable to carry anything in R arm    Time  8    Period  Weeks     Status  On-going    Target Date  07/31/18      Additional Long Term Goals   Additional Long Term Goals  Yes      PT LONG TERM GOAL #6   Title   Pt will decrease TUG by 4 seconds in order to demonstrate decreased fall risk     Baseline  06/05/18: 35.6 seconds    Time  8    Period  Weeks    Status  New    Target Date  07/31/18            Plan - 07/17/18 1410    Clinical Impression Statement  Spent the session discussing and practicing strategies for donning/doffing as well as gait training with new metal articulating AFO. Pt and wife report that his new AFO is difficult to put on. In addition when pt stands up from couch his heel slips out of his shoe and his wife is concerned about it causing him to fall. Upon assessment pt has very high tone in his R calf muscle with limited dorsiflexion range of motion. He can move to approximately neutral. Significant clonus also present with quick stretch. Pt also with considerable medial/lateral ankle stability. It was determined with practice that the best way to don his brace is to remove his brace from his shoe, don his shoe, and then reattach the brace. In addition some minor adjustments were made to decrease the spring tension on the dorsiflexion assist. Practiced gait with spring tension adjusted and continued to adjust slightly. Unfortunately with the dorsiflexion assist loosened it did increase  his knee hyperextension slightly however it was much safer with transfers and ambulation. Discussed the possible need for high top tennis shoes to prevent his heel from coming out of the shoe. In addition discussed dorsiflexion stretch and pt provided education/demonstration about how to perform. Pt performed right ankle soleus stretch for 30s to demonstrate proper form. Discussed night splints and low load long duration stretches with patient and wife. Pt had PODUS boots initially however his feet would always slide out of them. Pt and wife provided  additional options for stretching appliances.     Rehab Potential  Good    PT Frequency  2x / week    PT Duration  8 weeks    PT Treatment/Interventions  ADLs/Self Care Home Management;Aquatic Therapy;Cryotherapy;Electrical Stimulation;Iontophoresis 4mg /ml Dexamethasone;Moist Heat;Ultrasound;DME Instruction;Gait training;Stair training;Functional mobility training;Therapeutic activities;Therapeutic exercise;Balance training;Neuromuscular re-education;Cognitive remediation;Patient/family education;Orthotic Fit/Training;Manual techniques;Scar mobilization;Passive range of motion;Dry needling;Energy conservation;Splinting;Taping;Visual/perceptual remediation/compensation    PT Next Visit Plan  weight shifting activities, standing strength/balance/gait training. Adjust AFO as needed    PT Home Exercise Plan  marching in hooklying, supine hip abduction    Consulted and Agree with Plan of Care  Patient       Patient will benefit from skilled therapeutic intervention in order to improve the following deficits and impairments:  Abnormal gait, Decreased activity tolerance, Decreased balance, Decreased cognition, Decreased coordination, Decreased endurance, Decreased knowledge of precautions, Decreased knowledge of use of DME, Decreased mobility, Decreased range of motion, Decreased safety awareness, Decreased skin integrity, Decreased scar mobility, Decreased strength, Difficulty walking, Hypomobility, Hypermobility, Increased fascial restricitons, Increased muscle spasms, Impaired perceived functional ability, Impaired flexibility, Impaired sensation, Impaired tone, Impaired UE functional use, Impaired vision/preception, Improper body mechanics, Postural dysfunction, Pain  Visit Diagnosis: Muscle weakness (generalized)  Hemiplegia and hemiparesis following cerebral infarction affecting right non-dominant side (HCC)  Other lack of coordination     Problem List Patient Active Problem List   Diagnosis  Date Noted  . History of cranioplasty 03/19/2018  . PE (pulmonary thromboembolism) (HCC) 10/06/2017  . DVT (deep vein thrombosis) in pregnancy 10/06/2017  . Acute blood loss anemia   . Vascular headache   . Spastic hemiplegia of right dominant side as late effect of cerebral infarction (HCC)   . Benign essential HTN   . Frontal lobe and executive function deficit following cerebral infarction   . Adjustment disorder with mixed anxiety and depressed mood   . 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 Aphasia due to acute stroke (HCC) 08/07/2017  . B12 deficiency   . Acute respiratory failure (HCC)   . Central line infiltration (HCC)   . Endotracheal tube present   . Fever   . Leukocytosis   . S/P craniotomy 07/30/2017  . Cerebral edema (HCC) 07/28/2017  . Hyperlipidemia 07/28/2017  . Stroke (cerebrum) (HCC) 07/27/2017   Lynnea Maizes PT, DPT, GCS  , 07/17/2018, 2:27 PM  [facility] MAIN [facility] SERVICES [address] [place], [place], [address] Phone: [phone]   Fax:  [phone]  Name: Jeremy [name] MRN: [id number] Date of Birth: [birth year]

## 2018-07-20 ENCOUNTER — Other Ambulatory Visit: Payer: Self-pay | Admitting: Physical Medicine & Rehabilitation

## 2018-07-24 ENCOUNTER — Ambulatory Visit: Payer: BLUE CROSS/BLUE SHIELD | Admitting: Occupational Therapy

## 2018-07-24 ENCOUNTER — Ambulatory Visit: Payer: BLUE CROSS/BLUE SHIELD | Attending: Physical Medicine & Rehabilitation

## 2018-07-24 DIAGNOSIS — R2689 Other abnormalities of gait and mobility: Secondary | ICD-10-CM | POA: Insufficient documentation

## 2018-07-24 DIAGNOSIS — M6281 Muscle weakness (generalized): Secondary | ICD-10-CM | POA: Diagnosis not present

## 2018-07-24 DIAGNOSIS — I69353 Hemiplegia and hemiparesis following cerebral infarction affecting right non-dominant side: Secondary | ICD-10-CM | POA: Diagnosis present

## 2018-07-24 DIAGNOSIS — I63512 Cerebral infarction due to unspecified occlusion or stenosis of left middle cerebral artery: Secondary | ICD-10-CM | POA: Diagnosis present

## 2018-07-24 DIAGNOSIS — R278 Other lack of coordination: Secondary | ICD-10-CM | POA: Diagnosis present

## 2018-07-24 NOTE — Therapy (Signed)
[place] [facility] MAIN [facility] SERVICES [address] [place], [place], [address] Phone: [phone]   Fax:  [phone]  Physical Therapy Treatment  Patient Details  Name: Jeremy [name] MRN: [id number] Date of Birth: 22-Jan-1976 Referring Provider (PT): Dr. Claudette Laws   Encounter Date: 07/24/2018  PT End of Session - 07/24/18 1204    Visit Number  12    Number of Visits  33    Date for PT Re-Evaluation  07/31/18    Authorization Type  30 visits total for OT and PT combined     Authorization Time Period  11/15 PT visit    PT Start Time  1115   pt Arrived late    PT Stop Time  1151    PT Time Calculation (min)  36 min    Equipment Utilized During Treatment  Gait belt   Rt AFO    Activity Tolerance  Patient tolerated treatment well    Behavior During Therapy  WFL for tasks assessed/performed       Past Medical History:  Diagnosis Date  . Asthma    MILD AS A CHILD  . Deviated septum   . Dyspnea    NORMAL SPIROMETRY 02/01/17 VISIT WITH DR HEDRICK  . Hypertension   . Sleep apnea   . Stroke [facility])     Past Surgical History:  Procedure Laterality Date  . CRANIOPLASTY N/A 03/19/2018   Procedure: CRANIOPLASTY;  Surgeon: Lisbeth Renshaw, MD;  Location: [facility] OR;  Service: Neurosurgery;  Laterality: N/A;  CRANIOPLASTY  . CRANIOTOMY Left 07/29/2017   Procedure: LEFT HEMI- CRANIECTOMY WITH PLACEMENT OF BONE FLAP IN ABDOMEN;  Surgeon: Lisbeth Renshaw, MD;  Location: [facility] OR;  Service: Neurosurgery;  Laterality: Left;  . IR ANGIO INTRA EXTRACRAN SEL COM CAROTID INNOMINATE UNI R MOD SED  07/28/2017  . IR ANGIO VERTEBRAL SEL VERTEBRAL UNI R MOD SED  07/28/2017  . IR CT HEAD LTD  07/28/2017  . IR PERCUTANEOUS ART THROMBECTOMY/INFUSION INTRACRANIAL INC DIAG ANGIO  07/28/2017  . NASAL SEPTOPLASTY W/ TURBINOPLASTY Bilateral 03/06/2017   Procedure: NASAL SEPTOPLASTY WITH TURBINATE REDUCTION;  Surgeon: Linus Salmons, MD;  Location: [facility] ORS;  Service: ENT;   Laterality: Bilateral;  . RADIOLOGY WITH ANESTHESIA N/A 07/27/2017   Procedure: RADIOLOGY WITH ANESTHESIA;  Surgeon: Julieanne Cotton, MD;  Location: [facility] OR;  Service: Radiology;  Laterality: N/A;    There were no vitals filed for this visit.  Subjective Assessment - 07/24/18 1203    Subjective  Pt doing well this date. Reports AFO still not quite funcitoning properly, but some improvement after last session educational interventions with PT. Pt repports a headcold this date, feeling tired and 'sick', but HEP going well at home.     Pertinent History  Pt is a 43 y/o M who presented on Jul 27, 2017 with R sided weakness, headache, slurred speech. Cranial CT scan showed L insula and frontal operculum lucency, compatible with infarction. Underwent attempted mechanical thrombectomy per Interventional Radiology that was unsuccessful, required intubation. Follow-up MRI showed cerebellar edema, midline shift. Underwent L decompressive hemicraniectomy, placement of bone flap in abdominal pocket Jan 13th, 2019. Pt went to CIR at [facility] until 2/21 and then received HHPT until end of April, followed by therapy at this clinic, then O[facility] at [facility]. Pt now ambulating with quad cane at all times, limited primarily to in home ambulation. Uses riding cart at store. Pt is now able to go up/down curb on his own. Wife  assists with donning sock and shoe on R, shirt on the R. Wife assist bringing RLE into tub but pt shower independently. Pt requires occasional assist with RLE for supine<>sit. Pt's goals: reduce L low back spasms (believe to be due to weight shift to L), walking longer, to get back to work, to be able to cook a full meal, improve his balance, to be able to carry something while walking. Pt had Botox injections Aug 2019 in RLE and RUE. Plan is for repeat on Nov 1st. Pt with bone flap surgery ~1 month ago, pt denies precautions with this. Pt with h/o DVT in LLE and small PE in March 2019.        Intervention This Date:  -AMB 28800ft c SBQC: continued circumduction of RLE d/t poor hip flexion, AFO does not seem to be addressing foot drop effectively.  -STS from Edge of plinth 1x10  -Rt frog high adductor stretch 2x30sec -RLE abduction heel slides in supine 2x10 AA/ROM -Hooklying RLE marching AA/ROM 2x10 -Hooklying RLE resisted Leg press 2x10 -6 minutes of multiplanar UE functional use: cone moving across the midline -STS from Edge of plinth 1x10 Airex under left foot -Reciprocal marching seated elevated, VC for core stabilization.   PT Short Term Goals - 06/05/18 1310      PT SHORT TERM GOAL #1   Title  Pt will completed HEP at least 4 days/wk for improved carryover between sessions    Baseline  06/05/18: Pt has been completing his HEP 4 days/wk    Time  2    Period  Weeks    Status  Achieved        PT Long Term Goals - 06/05/18 1310      PT LONG TERM GOAL #1   Title  Pt's 10mWT will improve to equal or greater than 0.6 m/s for improved safety with ambulation in the home setting    Baseline  0.22 m/s; 06/05/18: self-selected: 42.2 = 0.24 m/s, fastest: 31.8s = 0.31 m/s    Time  8    Period  Weeks    Status  On-going    Target Date  07/31/18      PT LONG TERM GOAL #2   Title  Pt will be able to cook a full meal with min assist for improved QOL    Baseline  Pt able to perform simple meal prep (making sandwiches) but unable to cook full meal; 06/05/18: unchanged    Time  8    Period  Weeks    Status  On-going    Target Date  07/31/18      PT LONG TERM GOAL #3   Title  Pt's 6MWT will improve to at least 500 ft to demonstrate improved ambulatory endurance and speed    Baseline  210 ft; 06/05/18: 300'    Time  8    Period  Weeks    Status  On-going    Target Date  07/31/18      PT LONG TERM GOAL #4   Title  Pt will improve 5xSTS to equal or less than 15 seconds without UE support to demonstrate improved balance and strength    Baseline  20.15 sec with LUE  assist; 06/05/18: 15.0s with LUE assist, 18.5s without UE assist;    Time  8    Period  Weeks    Status  On-going    Target Date  07/31/18      PT LONG TERM GOAL #5  Title  Pt will be able to carry objects household distances for improved independence with homemaking and cooking activities.    Baseline  Carries small objects in L hand along with quad cane; 06/05/18: still unable to carry anything in R arm    Time  8    Period  Weeks    Status  On-going    Target Date  07/31/18      Additional Long Term Goals   Additional Long Term Goals  Yes      PT LONG TERM GOAL #6   Title   Pt will decrease TUG by 4 seconds in order to demonstrate decreased fall risk     Baseline  06/05/18: 35.6 seconds    Time  8    Period  Weeks    Status  New    Target Date  07/31/18            Plan - 07/24/18 1205    Clinical Impression Statement  Pt fatiguing quickly in session. AA/ROM applied to promote isolated activation of knee extensors, hip extensors, hip abductors, all with minimal siuccess, very low activation and quick onset fatigue. Transisiton to functional weight bearing activity is easier, but pt compensates heaviily with minimal weight bearing selectively throughou RLE. AFO stiill not providing enough support for ankle DF in gait, and concerningly deep into calf tissue on the medial superior most portion. Discussed with patient but this will need to be address at some point. Education on avoiding insult to skin.     Rehab Potential  Good    PT Frequency  2x / week    PT Duration  8 weeks    PT Treatment/Interventions  ADLs/Self Care Home Management;Aquatic Therapy;Cryotherapy;Electrical Stimulation;Iontophoresis 4mg /ml Dexamethasone;Moist Heat;Ultrasound;DME Instruction;Gait training;Stair training;Functional mobility training;Therapeutic activities;Therapeutic exercise;Balance training;Neuromuscular re-education;Cognitive remediation;Patient/family education;Orthotic Fit/Training;Manual  techniques;Scar mobilization;Passive range of motion;Dry needling;Energy conservation;Splinting;Taping;Visual/perceptual remediation/compensation    PT Next Visit Plan  cont weight shifting activities, standing strength/balance/gait training. Adjust AFO as needed    PT Home Exercise Plan  marching in hooklying, supine hip abduction    Consulted and Agree with Plan of Care  Patient    Family Member Consulted  wife       Patient will benefit from skilled therapeutic intervention in order to improve the following deficits and impairments:  Abnormal gait, Decreased activity tolerance, Decreased balance, Decreased cognition, Decreased coordination, Decreased endurance, Decreased knowledge of precautions, Decreased knowledge of use of DME, Decreased mobility, Decreased range of motion, Decreased safety awareness, Decreased skin integrity, Decreased scar mobility, Decreased strength, Difficulty walking, Hypomobility, Hypermobility, Increased fascial restricitons, Increased muscle spasms, Impaired perceived functional ability, Impaired flexibility, Impaired sensation, Impaired tone, Impaired UE functional use, Impaired vision/preception, Improper body mechanics, Postural dysfunction, Pain  Visit Diagnosis: Muscle weakness (generalized)  Hemiplegia and hemiparesis following cerebral infarction affecting right non-dominant side (HCC)  Other abnormalities of gait and mobility  Left middle cerebral artery stroke [facility])     Problem List Patient Active Problem List   Diagnosis Date Noted  . History of cranioplasty 03/19/2018  . PE (pulmonary thromboembolism) (HCC) 10/06/2017  . DVT (deep vein thrombosis) in pregnancy 10/06/2017  . Acute blood loss anemia   . Vascular headache   . Spastic hemiplegia of right dominant side as late effect of cerebral infarction (HCC)   . Benign essential HTN   . Frontal lobe and executive function deficit following cerebral infarction   . Adjustment disorder with  mixed anxiety and depressed mood   . Left middle  cerebral artery stroke (HCC) 08/07/2017  . Aphasia due to acute stroke (HCC) 08/07/2017  . B12 deficiency   . Acute respiratory failure (HCC)   . Central line infiltration (HCC)   . Endotracheal tube present   . Fever   . Leukocytosis   . S/P craniotomy 07/30/2017  . Cerebral edema (HCC) 07/28/2017  . Hyperlipidemia 07/28/2017  . Stroke (cerebrum) (HCC) 07/27/2017   12:12 PM, 07/24/18 Rosamaria Lints, PT, DPT Physical Therapist - [facility]  Outpatient Physical Therapy- Main Campus [phone]     Rosamaria Lints 07/24/2018, 12:10 PM  [place] [facility] MAIN [facility] SERVICES [address] [place], [place], [address] Phone: [phone]   Fax:  [phone]  Name: Jeremy [name] MRN: [id number] Date of Birth: March 31, 1976

## 2018-07-25 ENCOUNTER — Encounter: Payer: Self-pay | Admitting: Occupational Therapy

## 2018-07-25 NOTE — Therapy (Addendum)
[place] [facility] MAIN [facility] SERVICES [address] [place], [place], [address] Phone: [phone]   Fax:  [phone]  Occupational Therapy Treatment  Patient Details  Name: Jeremy [name] MRN: [id number] Date of Birth: [birth year] No data recorded  Encounter Date: 07/24/2018  OT End of Session - 07/25/18 0907    Visit Number  11    Number of Visits  24    Date for OT Re-Evaluation  10/01/2018   Authorization Time Period  visit limit 30    Activity Tolerance  Patient tolerated treatment well    Behavior During Therapy  [facility] for tasks assessed/performed       Past Medical History:  Diagnosis Date  . Asthma    MILD AS A CHILD  . Deviated septum   . Dyspnea    NORMAL SPIROMETRY 02/01/17 VISIT WITH DR HEDRICK  . Hypertension   . Sleep apnea   . Stroke [facility])     Past Surgical History:  Procedure Laterality Date  . CRANIOPLASTY N/A 03/19/2018   Procedure: CRANIOPLASTY;  Surgeon: Lisbeth Renshaw, MD;  Location: [facility] OR;  Service: Neurosurgery;  Laterality: N/A;  CRANIOPLASTY  . CRANIOTOMY Left 07/29/2017   Procedure: LEFT HEMI- CRANIECTOMY WITH PLACEMENT OF BONE FLAP IN ABDOMEN;  Surgeon: Lisbeth Renshaw, MD;  Location: [facility] OR;  Service: Neurosurgery;  Laterality: Left;  . IR ANGIO INTRA EXTRACRAN SEL COM CAROTID INNOMINATE UNI R MOD SED  07/28/2017  . IR ANGIO VERTEBRAL SEL VERTEBRAL UNI R MOD SED  07/28/2017  . IR CT HEAD LTD  07/28/2017  . IR PERCUTANEOUS ART THROMBECTOMY/INFUSION INTRACRANIAL INC DIAG ANGIO  07/28/2017  . NASAL SEPTOPLASTY W/ TURBINOPLASTY Bilateral 03/06/2017   Procedure: NASAL SEPTOPLASTY WITH TURBINATE REDUCTION;  Surgeon: Linus Salmons, MD;  Location: [facility] ORS;  Service: ENT;  Laterality: Bilateral;  . RADIOLOGY WITH ANESTHESIA N/A 07/27/2017   Procedure: RADIOLOGY WITH ANESTHESIA;  Surgeon: Julieanne Cotton, MD;  Location: [facility] OR;  Service: Radiology;  Laterality: N/A;    There were no vitals filed for this visit.  Subjective  Assessment - 07/25/18 0844    Subjective   Pt. has a cold    Patient is accompained by:  Family member    Pertinent History  Pt is a 43 y/o M who presented on Jul 27, 2017 with R sided weakness, headache, slurred speech. Cranial CT scan showed L insula and frontal operculum lucency, compatible with infarction. Underwent attempted mechanical thrombectomy per Interventional Radiology that was unsuccessful, required intubation. Follow-up MRI showed cerebellar edema, midline shift. Underwent L decompressive hemicraniectomy, placement of bone flap in abdominal pocket Jan 13th, 2019. Pt went to CIR at [facility] until 2/21 and then received HHPT until end of April, followed by therapy at this clinic, then OPPT at [facility] probono clinic once his insurance benefits ran out.  Patient now has new insurance and has returned to therapy for OT evaluation.      Patient Stated Goals  Patient would like to regain ability to take care of self.    Currently in Pain?  Yes    Pain Score  4     Pain Location  Shoulder    Pain Orientation  Right    Pain Onset  More than a month ago    Pain Frequency  Intermittent    Multiple Pain Sites  No      OT TREATMENT    Therapeutic Exercise:  Pt. tolerated RUE ROM in all joint ranges. Pt. Presents with pain  in the right shoulder initially at the end of the range, which progressed with ROM. Pt. continues to wear his right shoulder harness brace.  Selfcare:  Pt. worked on donning, and doffing his right sock using a dressing stick from an unsupported sitting position. Pt. required modA for placement of the dressing stick over the heel. Pt. was independently able to remove the distal end of the sock. Pt. could benefit from a longer dressing stick. Will attempt using a reacher to remove the sock next visit. Pt. was able to use a sockaide with maxA to set-up the sock onto the sockaide, modA to position the sockaide onto the right foot, and minA to pull the sock on over the  foot using both ropes in his right hand, with the right rope threaded behind hi right leg for the most success, and angle of pull.                            OT Education - 07/25/18 0906    Education provided  Yes    Education Details  donning and doffing of new brace and shoe    Person(s) Educated  Patient    Methods  Explanation;Demonstration;Verbal cues    Comprehension  Verbalized understanding;Returned demonstration          OT Long Term Goals - 04/15/18 1918      OT LONG TERM GOAL #1   Title  Patient will demonstrate improved balance to perform clothing negotiation with dressing/toileting with supervision only.     Baseline  min assist    Time  12    Period  Weeks    Status  New    Target Date  07/08/18      OT LONG TERM GOAL #2   Title  Patient will demonstrate ability to manage shoulder brace with modified independence.    Baseline  unable at eval    Time  4    Period  Weeks    Status  New    Target Date  05/13/18      OT LONG TERM GOAL #3   Title  Patient will demonstrate trace movement of elbow extension with facilitation to work towards neuromuscular reeducation for active arm use on the right .    Baseline  no active movement at eval    Time  12    Period  Weeks    Status  New    Target Date  07/08/18      OT LONG TERM GOAL #4   Title  Patient will demonstate understanding of compensatory strategies to protect right UE from harm during engagement  in daily activities.     Baseline  sensation absent in right UE and at risk for injury.    Time  3    Period  Weeks    Status  New    Target Date  05/06/18      OT LONG TERM GOAL #5   Title  Patient will complete cutting meat one handed with modified independence with adaptive equipment as needed.     Baseline  unable at eval    Time  12    Period  Weeks    Status  New    Target Date  07/08/18      Long Term Additional Goals   Additional Long Term Goals  Yes      OT LONG TERM  GOAL #6   Title  Patient  will complete UB dressing with modified independence    Baseline  min assist     Time  12    Period  Weeks    Status  New    Target Date  07/08/18      OT LONG TERM GOAL #7   Title  Patient will complete shoe and sock donning with modified independence.    Baseline  unable at eval    Time  12    Period  Weeks    Status  New    Target Date  07/08/18      OT LONG TERM GOAL #8   Title  Patient will complete lower body dressing with occasional assist for buttons/zippers.     Baseline  min assist    Time  12    Period  Weeks    Status  New    Target Date  07/08/18            Plan - 07/25/18 0908    Clinical Impression Statement  Pt. has a cold, congestion and has generally not been feeling well overall today. Pt. continues to present with RUE flaccidity with no active movement elicited. Pt. has a new hinged shoe, however the sole now has a crack in it. Pt. requires MaxA fto donn the shoe. Pt. continues to work on strategies to assist with sefl-dresisng, and ADL tasks.     Occupational Profile and client history currently impacting functional performance  absent sensation in RUE from shoulder to digits, no active right UE movement, decreased right peripheral vision, has 36 month old infant at home, recent move from 3rd story apartment, unable to work, bone flap replaced in 9/19    Occupational performance deficits (Please refer to evaluation for details):  ADL's;IADL's;Leisure;Work    Electrical engineer    Current Impairments/barriers affecting progress:  positive:  motivation, family support.  Negative:  young age, lack of sensation on the right for light touch, sharp/dull, deep pressure, hot/cold, processing delay cognitively    OT Frequency  2x / week    OT Duration  12 weeks    OT Treatment/Interventions  Self-care/ADL training;Therapeutic exercise;Neuromuscular education;Patient/family education;Building services engineer;Therapeutic  activities;Balance training;DME and/or AE instruction;Manual Therapy;Passive range of motion    Clinical Decision Making  Several treatment options, min-mod task modification necessary    Consulted and Agree with Plan of Care  Patient;Family member/caregiver       Patient will benefit from skilled therapeutic intervention in order to improve the following deficits and impairments:  Abnormal gait, Decreased cognition, Decreased knowledge of use of DME, Increased edema, Pain, Decreased coordination, Decreased mobility, Impaired sensation, Decreased activity tolerance, Decreased endurance, Decreased range of motion, Decreased strength, Impaired tone, Decreased balance, Decreased knowledge of precautions, Decreased safety awareness, Difficulty walking, Impaired UE functional use  Visit Diagnosis: Muscle weakness (generalized)    Problem List Patient Active Problem List   Diagnosis Date Noted  . History of cranioplasty 03/19/2018  . PE (pulmonary thromboembolism) (HCC) 10/06/2017  . DVT (deep vein thrombosis) in pregnancy 10/06/2017  . Acute blood loss anemia   . Vascular headache   . Spastic hemiplegia of right dominant side as late effect of cerebral infarction (HCC)   . Benign essential HTN   . Frontal lobe and executive function deficit following cerebral infarction   . Adjustment disorder with mixed anxiety and depressed mood   . 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 Aphasia due to acute stroke (HCC) 08/07/2017  .  B12 deficiency   . Acute respiratory failure (HCC)   . Central line infiltration (HCC)   . Endotracheal tube present   . Fever   . Leukocytosis   . S/P craniotomy 07/30/2017  . Cerebral edema (HCC) 07/28/2017  . Hyperlipidemia 07/28/2017  . Stroke (cerebrum) (HCC) 07/27/2017    Olegario MessierElaine Adhira Jamil, MS, OTR/L 07/25/2018, 9:14 AM  [facility]IN [facility] [address] [place], [place], [address] Phone:  [phone]   Fax:  [phone]  Name: Cletus GashJao Capano MRN: [id number] Date of Birth: 1976-04-19

## 2018-07-31 ENCOUNTER — Ambulatory Visit: Payer: BLUE CROSS/BLUE SHIELD

## 2018-07-31 ENCOUNTER — Ambulatory Visit: Payer: BLUE CROSS/BLUE SHIELD | Admitting: Occupational Therapy

## 2018-08-05 ENCOUNTER — Encounter: Payer: Self-pay | Admitting: Psychology

## 2018-08-05 ENCOUNTER — Ambulatory Visit: Payer: BLUE CROSS/BLUE SHIELD | Admitting: Physical Medicine & Rehabilitation

## 2018-08-06 ENCOUNTER — Ambulatory Visit: Payer: BLUE CROSS/BLUE SHIELD | Admitting: Physical Medicine & Rehabilitation

## 2018-08-06 ENCOUNTER — Encounter: Payer: BLUE CROSS/BLUE SHIELD | Attending: Physical Medicine & Rehabilitation

## 2018-08-06 VITALS — BP 146/101 | HR 102 | Ht 71.0 in | Wt 200.0 lb

## 2018-08-06 DIAGNOSIS — I10 Essential (primary) hypertension: Secondary | ICD-10-CM | POA: Diagnosis not present

## 2018-08-06 DIAGNOSIS — I69351 Hemiplegia and hemiparesis following cerebral infarction affecting right dominant side: Secondary | ICD-10-CM | POA: Insufficient documentation

## 2018-08-06 DIAGNOSIS — Z8249 Family history of ischemic heart disease and other diseases of the circulatory system: Secondary | ICD-10-CM | POA: Insufficient documentation

## 2018-08-06 DIAGNOSIS — G4730 Sleep apnea, unspecified: Secondary | ICD-10-CM | POA: Diagnosis not present

## 2018-08-06 DIAGNOSIS — I69319 Unspecified symptoms and signs involving cognitive functions following cerebral infarction: Secondary | ICD-10-CM | POA: Insufficient documentation

## 2018-08-06 NOTE — Progress Notes (Signed)
Phenol neurolysis of the RIGHT tibial nerve  Indication: Severe spasticity in the plantar flexor muscles which is not responding to medical management and other conservative care and interfering with functional use.  Informed consent was obtained after describing the risks and benefits of the procedure with the patient this includes bleeding bruising and infection as well as medication side effects. The patient elected to proceed and has given written consent. Patient placed in a prone position on the exam table. External DC stimulation was applied to the popliteal space using a nerve stimulator. Plantar flexion twitch was obtained. The popliteal region was prepped with Betadine and then entered with a 22-gauge 40 mm needle electrode under electrical stimulation guidance. Plantar flexion which was obtained and confirmed. Then 4 cc of 5% phenol were injected. The patient tolerated procedure well. Post procedure instructions and followup visit were given.  Botox to Right Finger and thumb flexors 100 U in 3-4 wks

## 2018-08-06 NOTE — Patient Instructions (Signed)
Tibial nerve block with phenol today. This medication may start taking the fact today however full effect will be at about one week Duration of the effect is 3-6 months Side effects of medication may include right heel numbness or burning. Call if you have burning pain so we can recommend any medication for that. 

## 2018-08-07 ENCOUNTER — Ambulatory Visit: Payer: BLUE CROSS/BLUE SHIELD

## 2018-08-07 ENCOUNTER — Ambulatory Visit: Payer: BLUE CROSS/BLUE SHIELD | Admitting: Occupational Therapy

## 2018-08-07 ENCOUNTER — Encounter: Payer: Self-pay | Admitting: Occupational Therapy

## 2018-08-07 DIAGNOSIS — M6281 Muscle weakness (generalized): Secondary | ICD-10-CM

## 2018-08-07 DIAGNOSIS — R278 Other lack of coordination: Secondary | ICD-10-CM

## 2018-08-07 NOTE — Therapy (Signed)
[place] [facility] MAIN [facility] SERVICES [address] [place], [place], [address] Phone: [phone]   Fax:  [phone]  Occupational Therapy Treatment  Patient Details  Name: Jeremy [name] MRN: [id number] Date of Birth: 09-Jan-1976 No data recorded  Encounter Date: 08/07/2018  OT End of Session - 08/07/18 1623    Visit Number  12    Number of Visits  24    Date for OT Re-Evaluation  10/01/18    Authorization Type  [employer]    Authorization Time Period  visit limit 30    OT Start Time  1145    OT Stop Time  1230    OT Time Calculation (min)  45 min    Activity Tolerance  Patient tolerated treatment well    Behavior During Therapy  [facility] for tasks assessed/performed       Past Medical History:  Diagnosis Date  . Asthma    MILD AS A CHILD  . Deviated septum   . Dyspnea    NORMAL SPIROMETRY 02/01/17 VISIT WITH DR HEDRICK  . Hypertension   . Sleep apnea   . Stroke [facility])     Past Surgical History:  Procedure Laterality Date  . CRANIOPLASTY N/A 03/19/2018   Procedure: CRANIOPLASTY;  Surgeon: Lisbeth Renshaw, MD;  Location: [facility] OR;  Service: Neurosurgery;  Laterality: N/A;  CRANIOPLASTY  . CRANIOTOMY Left 07/29/2017   Procedure: LEFT HEMI- CRANIECTOMY WITH PLACEMENT OF BONE FLAP IN ABDOMEN;  Surgeon: Lisbeth Renshaw, MD;  Location: [facility] OR;  Service: Neurosurgery;  Laterality: Left;  . IR ANGIO INTRA EXTRACRAN SEL COM CAROTID INNOMINATE UNI R MOD SED  07/28/2017  . IR ANGIO VERTEBRAL SEL VERTEBRAL UNI R MOD SED  07/28/2017  . IR CT HEAD LTD  07/28/2017  . IR PERCUTANEOUS ART THROMBECTOMY/INFUSION INTRACRANIAL INC DIAG ANGIO  07/28/2017  . NASAL SEPTOPLASTY W/ TURBINOPLASTY Bilateral 03/06/2017   Procedure: NASAL SEPTOPLASTY WITH TURBINATE REDUCTION;  Surgeon: Linus Salmons, MD;  Location: [facility] ORS;  Service: ENT;  Laterality: Bilateral;  . RADIOLOGY WITH ANESTHESIA N/A 07/27/2017   Procedure: RADIOLOGY WITH ANESTHESIA;  Surgeon: Julieanne Cotton, MD;   Location: [facility] OR;  Service: Radiology;  Laterality: N/A;    There were no vitals filed for this visit.  Subjective Assessment - 08/07/18 1623    Subjective   Pt. reports feeling better    Patient is accompained by:  Family member    Pertinent History  Pt is a 43 y/o M who presented on Jul 27, 2017 with R sided weakness, headache, slurred speech. Cranial CT scan showed L insula and frontal operculum lucency, compatible with infarction. Underwent attempted mechanical thrombectomy per Interventional Radiology that was unsuccessful, required intubation. Follow-up MRI showed cerebellar edema, midline shift. Underwent L decompressive hemicraniectomy, placement of bone flap in abdominal pocket Jan 13th, 2019. Pt went to CIR at [facility] until 2/21 and then received HHPT until end of April, followed by therapy at this clinic, then OPPT at [facility]ono [facility] once his insurance benefits ran out.  Patient now has new insurance and has returned to therapy for OT evaluation.      Patient Stated Goals  Patient would like to regain ability to take care of self.    Currently in Pain?  Yes       OT TREATMENT    Neuro muscular re-education:  Pt. worked on weightbearing through his RUE and hand to normalize tone and prepare the UE for ROM. Pt. required support, and alignment of  the humeral head at the axilla during weightbearing while crossing midline to reach in various planes. Pt. Used a swissball for forward flexion, and trunk rotation.   Therapeutic Exercise:  Pt. tolerated PROM in all joint ranges of the RUE, and hand including shoulder flexion, abduction, horizontal abduction, elbow flexion, extension, forearm supination, wrist flexion, extension, and digit MP flexion, and extension. Pt. Attempted shoulder stabilization exercises in supine with the shoulder flexed to 90 degrees with elbow extended. Pt. was unable to hold.                           OT Education -  08/07/18 1623    Education provided  Yes    Education Details  RUE functioning    Person(s) Educated  Patient    Methods  Explanation;Demonstration;Verbal cues    Comprehension  Verbalized understanding;Returned demonstration          OT Long Term Goals - 04/15/18 1918      OT LONG TERM GOAL #1   Title  Patient will demonstrate improved balance to perform clothing negotiation with dressing/toileting with supervision only.     Baseline  min assist    Time  12    Period  Weeks    Status  New    Target Date  07/08/18      OT LONG TERM GOAL #2   Title  Patient will demonstrate ability to manage shoulder brace with modified independence.    Baseline  unable at eval    Time  4    Period  Weeks    Status  New    Target Date  05/13/18      OT LONG TERM GOAL #3   Title  Patient will demonstrate trace movement of elbow extension with facilitation to work towards neuromuscular reeducation for active arm use on the right .    Baseline  no active movement at eval    Time  12    Period  Weeks    Status  New    Target Date  07/08/18      OT LONG TERM GOAL #4   Title  Patient will demonstate understanding of compensatory strategies to protect right UE from harm during engagement  in daily activities.     Baseline  sensation absent in right UE and at risk for injury.    Time  3    Period  Weeks    Status  New    Target Date  05/06/18      OT LONG TERM GOAL #5   Title  Patient will complete cutting meat one handed with modified independence with adaptive equipment as needed.     Baseline  unable at eval    Time  12    Period  Weeks    Status  New    Target Date  07/08/18      Long Term Additional Goals   Additional Long Term Goals  Yes      OT LONG TERM GOAL #6   Title  Patient will complete UB dressing with modified independence    Baseline  min assist     Time  12    Period  Weeks    Status  New    Target Date  07/08/18      OT LONG TERM GOAL #7   Title  Patient will  complete shoe and sock donning with modified independence.    Baseline  unable at eval    Time  12    Period  Weeks    Status  New    Target Date  07/08/18      OT LONG TERM GOAL #8   Title  Patient will complete lower body dressing with occasional assist for buttons/zippers.     Baseline  min assist    Time  12    Period  Weeks    Status  New    Target Date  07/08/18            Plan - 08/07/18 1642    Clinical Impression Statement  Pt. continues to present with no initiation of active movement. Pt. continues to present with increased extensor tone in his right elbow, and increased flexor tone in his MP, PIP, and DIP joints. Pt. continues to work on normalizing tone, in order to facilitate active movement, and increase RUE engagement in ADLs, and IADLs.     Occupational Profile and client history currently impacting functional performance  absent sensation in RUE from shoulder to digits, no active right UE movement, decreased right peripheral vision, has 80 month old infant at home, recent move from 3rd story apartment, unable to work, bone flap replaced in 9/19    Occupational performance deficits (Please refer to evaluation for details):  ADL's;IADL's;Leisure;Work    Electrical engineer    Current Impairments/barriers affecting progress:  positive:  motivation, family support.  Negative:  young age, lack of sensation on the right for light touch, sharp/dull, deep pressure, hot/cold, processing delay cognitively    OT Frequency  2x / week    OT Duration  12 weeks    OT Treatment/Interventions  Self-care/ADL training;Therapeutic exercise;Neuromuscular education;Patient/family education;Building services engineer;Therapeutic activities;Balance training;DME and/or AE instruction;Manual Therapy;Passive range of motion    Clinical Decision Making  Several treatment options, min-mod task modification necessary    Consulted and Agree with Plan of Care  Patient;Family member/caregiver     Family Member Consulted  wife       Patient will benefit from skilled therapeutic intervention in order to improve the following deficits and impairments:  Abnormal gait, Decreased cognition, Decreased knowledge of use of DME, Increased edema, Pain, Decreased coordination, Decreased mobility, Impaired sensation, Decreased activity tolerance, Decreased endurance, Decreased range of motion, Decreased strength, Impaired tone, Decreased balance, Decreased knowledge of precautions, Decreased safety awareness, Difficulty walking, Impaired UE functional use  Visit Diagnosis: Muscle weakness (generalized)  Other lack of coordination    Problem List Patient Active Problem List   Diagnosis Date Noted  . History of cranioplasty 03/19/2018  . PE (pulmonary thromboembolism) (HCC) 10/06/2017  . DVT (deep vein thrombosis) in pregnancy 10/06/2017  . Acute blood loss anemia   . Vascular headache   . Spastic hemiplegia of right dominant side as late effect of cerebral infarction (HCC)   . Benign essential HTN   . Frontal lobe and executive function deficit following cerebral infarction   . Adjustment disorder with mixed anxiety and depressed mood   . 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 Aphasia due to acute stroke (HCC) 08/07/2017  . B12 deficiency   . Acute respiratory failure (HCC)   . Central line infiltration (HCC)   . Endotracheal tube present   . Fever   . Leukocytosis   . S/P craniotomy 07/30/2017  . Cerebral edema (HCC) 07/28/2017  . Hyperlipidemia 07/28/2017  . Stroke (cerebrum) (HCC) 07/27/2017    Olegario Messier, MS, OTR/L 08/07/2018, 5:07 PM  [place] [facility] MAIN [facility] [address] [place], [place], [address] Phone: [phone]   Fax:  [phone]  Name: Jeremy GashJao [name] MRN: [id number] Date of Birth: 1976/05/12

## 2018-08-12 ENCOUNTER — Ambulatory Visit: Payer: Self-pay | Admitting: Adult Health

## 2018-08-14 ENCOUNTER — Ambulatory Visit: Payer: BLUE CROSS/BLUE SHIELD

## 2018-08-14 ENCOUNTER — Encounter: Payer: BLUE CROSS/BLUE SHIELD | Admitting: Occupational Therapy

## 2018-08-15 NOTE — Addendum Note (Signed)
Addended by: Derrek Gu T on: 08/15/2018 09:16 AM   Modules accepted: Orders

## 2018-08-22 ENCOUNTER — Ambulatory Visit: Payer: Medicaid Other | Admitting: Occupational Therapy

## 2018-08-22 ENCOUNTER — Ambulatory Visit: Payer: Medicaid Other

## 2018-08-22 ENCOUNTER — Ambulatory Visit: Payer: Self-pay | Admitting: Adult Health

## 2018-08-23 ENCOUNTER — Encounter: Payer: Self-pay | Admitting: Adult Health

## 2018-08-27 ENCOUNTER — Encounter: Payer: Self-pay | Admitting: Occupational Therapy

## 2018-08-27 ENCOUNTER — Ambulatory Visit: Payer: Medicaid Other | Admitting: Occupational Therapy

## 2018-08-27 ENCOUNTER — Ambulatory Visit: Payer: Medicaid Other | Attending: Physical Medicine & Rehabilitation

## 2018-08-27 DIAGNOSIS — M6281 Muscle weakness (generalized): Secondary | ICD-10-CM | POA: Insufficient documentation

## 2018-08-27 DIAGNOSIS — I69353 Hemiplegia and hemiparesis following cerebral infarction affecting right non-dominant side: Secondary | ICD-10-CM | POA: Insufficient documentation

## 2018-08-27 DIAGNOSIS — R2689 Other abnormalities of gait and mobility: Secondary | ICD-10-CM | POA: Insufficient documentation

## 2018-08-27 DIAGNOSIS — R278 Other lack of coordination: Secondary | ICD-10-CM | POA: Insufficient documentation

## 2018-08-27 NOTE — Therapy (Signed)
[facility] MAIN [facility] SERVICES [address] [place], [place], [address] Phone: [phone]   Fax:  [phone]  Occupational Therapy Treatment  Patient Details  Name: Jeremy [name] MRN: [id number] Date of Birth: Aug 30, 1975 No data recorded  Encounter Date: 08/27/2018  OT End of Session - 08/27/18 1753    Visit Number  13    Number of Visits  24    Date for OT Re-Evaluation  10/01/18    Authorization Type  [employer]    Authorization Time Period  visit limit 30    OT Start Time  1555    OT Stop Time  1635    OT Time Calculation (min)  40 min    Activity Tolerance  Patient tolerated treatment well    Behavior During Therapy  [facility] for tasks assessed/performed       Past Medical History:  Diagnosis Date  . Asthma    MILD AS A CHILD  . Deviated septum   . Dyspnea    NORMAL SPIROMETRY 02/01/17 VISIT WITH DR HEDRICK  . Hypertension   . Sleep apnea   . Stroke [facility])     Past Surgical History:  Procedure Laterality Date  . CRANIOPLASTY N/A 03/19/2018   Procedure: CRANIOPLASTY;  Surgeon: Lisbeth Renshaw, MD;  Location: [facility] OR;  Service: Neurosurgery;  Laterality: N/A;  CRANIOPLASTY  . CRANIOTOMY Left 07/29/2017   Procedure: LEFT HEMI- CRANIECTOMY WITH PLACEMENT OF BONE FLAP IN ABDOMEN;  Surgeon: Lisbeth Renshaw, MD;  Location: [facility] OR;  Service: Neurosurgery;  Laterality: Left;  . IR ANGIO INTRA EXTRACRAN SEL COM CAROTID INNOMINATE UNI R MOD SED  07/28/2017  . IR ANGIO VERTEBRAL SEL VERTEBRAL UNI R MOD SED  07/28/2017  . IR CT HEAD LTD  07/28/2017  . IR PERCUTANEOUS ART THROMBECTOMY/INFUSION INTRACRANIAL INC DIAG ANGIO  07/28/2017  . NASAL SEPTOPLASTY W/ TURBINOPLASTY Bilateral 03/06/2017   Procedure: NASAL SEPTOPLASTY WITH TURBINATE REDUCTION;  Surgeon: Linus Salmons, MD;  Location: [facility] ORS;  Service: ENT;  Laterality: Bilateral;  . RADIOLOGY WITH ANESTHESIA N/A 07/27/2017   Procedure: RADIOLOGY WITH ANESTHESIA;  Surgeon: Julieanne Cotton, MD;   Location: [facility] OR;  Service: Radiology;  Laterality: N/A;    There were no vitals filed for this visit.  Subjective Assessment - 08/27/18 1752    Subjective   Pt. reports that he travelled to [name] over the past fews weeks.    Patient is accompained by:  Family member    Pertinent History  Pt is a 43 y/o M who presented on Jul 27, 2017 with R sided weakness, headache, slurred speech. Cranial CT scan showed L insula and frontal operculum lucency, compatible with infarction. Underwent attempted mechanical thrombectomy per Interventional Radiology that was unsuccessful, required intubation. Follow-up MRI showed cerebellar edema, midline shift. Underwent L decompressive hemicraniectomy, placement of bone flap in abdominal pocket Jan 13th, 2019. Pt went to CIR at [facility] until 2/21 and then received HHPT until end of April, followed by therapy at this clinic, then OPPT at [facility]ono [facility] once his insurance benefits ran out.  Patient now has new insurance and has returned to therapy for OT evaluation.      Patient Stated Goals  Patient would like to regain ability to take care of self.    Currently in Pain?  No/denies       OT TREATMENT    Neuro muscular re-education:   Pt. worked on weightbearing through the RUE, and hand with alignment provided at the  humeral head. Pt. Worked on reaching with the LUE crossing midline to place clips at various high heights, Pt. worked on reaching forward, and organizing letters to make 3, and 4 letter words.     Therapeutic Exercise:  Pt. tolerated PROM to all joint ranges of the RUE, hand, and digits in preparation for engaging his RUE during ADLS.  Selfcare:  Pt. Required minA to donn his jacket, requiring cues and assist to move the shirt around the back of the neck, and thread his left UE through.  Manual Therapy:  Pt. Tolerated scapular mobes for elevation depression, and abduction/rotation to prepare the UE for  ROM.                       OT Education - 08/27/18 1753    Education provided  Yes    Education Details  RUE functioning    Person(s) Educated  Patient    Methods  Explanation;Demonstration;Verbal cues    Comprehension  Verbalized understanding;Returned demonstration          OT Long Term Goals - 08/15/18 0856      OT LONG TERM GOAL #1   Title  Patient will demonstrate improved balance to perform clothing negotiation with dressing/toileting with supervision only.     Baseline  min assist    Time  12    Period  Weeks    Status  New      OT LONG TERM GOAL #2   Title  Patient will demonstrate ability to manage shoulder brace with modified independence.    Baseline  unable at eval    Time  4    Period  Weeks    Status  New      OT LONG TERM GOAL #3   Title  Patient will demonstrate trace movement of elbow extension with facilitation to work towards neuromuscular reeducation for active arm use on the right .    Baseline  no active movement at eval    Time  12    Period  Weeks    Status  New      OT LONG TERM GOAL #4   Title  Patient will demonstate understanding of compensatory strategies to protect right UE from harm during engagement  in daily activities.     Baseline  sensation absent in right UE and at risk for injury.    Time  3    Period  Weeks    Status  New      OT LONG TERM GOAL #5   Title  Patient will complete cutting meat one handed with modified independence with adaptive equipment as needed.     Baseline  unable at eval    Time  12    Period  Weeks    Status  New      OT LONG TERM GOAL #6   Title  Patient will complete UB dressing with modified independence    Baseline  min assist     Time  12    Period  Weeks    Status  New      OT LONG TERM GOAL #7   Title  Patient will complete shoe and sock donning with modified independence.    Baseline  unable at eval    Time  12    Period  Weeks    Status  New      OT LONG TERM GOAL  #8   Title  Patient will complete  lower body dressing with occasional assist for buttons/zippers.     Baseline  min assist    Time  12    Period  Weeks    Status  New            Plan - 08/27/18 1754    Clinical Impression Statement  Pt. continues to present no initation of active mevement in his RUE. Pt.'s right elbow was more relaxed with decreased extensor tone today. Pt. continues to present with increased flexor tone, and tightness in his right hand. Pt. continues to benefit from OT serivces to work on normalizing tone in the RUE, eliciting active muscle responses in the RUE, increase RUE engagement in ADLS, and IADLs, and improve self-dressing skills.     Occupational Profile and client history currently impacting functional performance  absent sensation in RUE from shoulder to digits, no active right UE movement, decreased right peripheral vision, has 586 month old infant at home, recent move from 3rd story apartment, unable to work, bone flap replaced in 9/19    Occupational performance deficits (Please refer to evaluation for details):  ADL's;IADL's;Leisure;Work    Electrical engineerehab Potential  Fair    Current Impairments/barriers affecting progress:  positive:  motivation, family support.  Negative:  young age, lack of sensation on the right for light touch, sharp/dull, deep pressure, hot/cold, processing delay cognitively    OT Frequency  2x / week    OT Duration  12 weeks    OT Treatment/Interventions  Self-care/ADL training;Therapeutic exercise;Neuromuscular education;Patient/family education;Building services engineerunctional Mobility Training;Therapeutic activities;Balance training;DME and/or AE instruction;Manual Therapy;Passive range of motion    Clinical Decision Making  Several treatment options, min-mod task modification necessary    Consulted and Agree with Plan of Care  Patient;Family member/caregiver    Family Member Consulted  wife       Patient will benefit from skilled therapeutic intervention in  order to improve the following deficits and impairments:  Abnormal gait, Decreased cognition, Decreased knowledge of use of DME, Increased edema, Pain, Decreased coordination, Decreased mobility, Impaired sensation, Decreased activity tolerance, Decreased endurance, Decreased range of motion, Decreased strength, Impaired tone, Decreased balance, Decreased knowledge of precautions, Decreased safety awareness, Difficulty walking, Impaired UE functional use  Visit Diagnosis: Muscle weakness (generalized)    Problem List Patient Active Problem List   Diagnosis Date Noted  . History of cranioplasty 03/19/2018  . PE (pulmonary thromboembolism) (HCC) 10/06/2017  . DVT (deep vein thrombosis) in pregnancy 10/06/2017  . Acute blood loss anemia   . Vascular headache   . Spastic hemiplegia of right dominant side as late effect of cerebral infarction (HCC)   . Benign essential HTN   . Frontal lobe and executive function deficit following cerebral infarction   . Adjustment disorder with mixed anxiety and depressed mood   . 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 Aphasia due to acute stroke (HCC) 08/07/2017  . B12 deficiency   . Acute respiratory failure (HCC)   . Central line infiltration (HCC)   . Endotracheal tube present   . Fever   . Leukocytosis   . S/P craniotomy 07/30/2017  . Cerebral edema (HCC) 07/28/2017  . Hyperlipidemia 07/28/2017  . Stroke (cerebrum) (HCC) 07/27/2017    Olegario MessierElaine Kayce Betty, MS, OTR/L 08/27/2018, 6:03 PM  [facility]IN [facility] [address] [place], [place], [address] Phone: [phone]   Fax:  [phone]  Name: Jeremy GashJao [name] MRN: [id number] Date of Birth: June 10, 1976

## 2018-08-27 NOTE — Therapy (Signed)
Progress Note Reporting Period 04/15/18 to 07/31/18  See note below for Objective Data and Assessment of Progress/Goals.      [facility] [address] [place], [place], [address] Phone: [phone]   Fax:  [phone]  Physical Therapy Treatment/Reassessment  Patient Details  Name: Jeremy GashJao [name] MRN: [id number] Date of Birth: 12-24-1975 Referring Provider (PT): Dr. Claudette LawsAndrew Kirsteins   Encounter Date: 08/27/2018  PT End of Session - 08/27/18 1530    Visit Number  13    Number of Visits  33    Date for PT Re-Evaluation  08/31/18    Authorization Type  30 visits total for OT and PT combined     Authorization Time Period  12/15 PT visit    PT Start Time  1515   pt arrived late   PT Stop Time  1549    PT Time Calculation (min)  34 min    Activity Tolerance  Patient tolerated treatment well;Patient limited by fatigue    Behavior During Therapy  [facility] for tasks assessed/performed       Past Medical History:  Diagnosis Date  . Asthma    MILD AS A CHILD  . Deviated septum   . Dyspnea    NORMAL SPIROMETRY 02/01/17 VISIT WITH DR HEDRICK  . Hypertension   . Sleep apnea   . Stroke [facility](HCC)     Past Surgical History:  Procedure Laterality Date  . CRANIOPLASTY N/A 03/19/2018   Procedure: CRANIOPLASTY;  Surgeon: Lisbeth RenshawNundkumar, Neelesh, MD;  [facility];  Service: Neurosurgery;  Laterality: N/A;  CRANIOPLASTY  . CRANIOTOMY Left 07/29/2017   Procedure: LEFT HEMI- CRANIECTOMY WITH PLACEMENT OF BONE FLAP IN ABDOMEN;  Surgeon: Lisbeth RenshawNundkumar, Neelesh, MD;  Location: [facility] OR;  Service: Neurosurgery;  Laterality: Left;  . IR ANGIO INTRA EXTRACRAN SEL COM CAROTID INNOMINATE UNI R MOD SED  07/28/2017  . IR ANGIO VERTEBRAL SEL VERTEBRAL UNI R MOD SED  07/28/2017  . IR CT HEAD LTD  07/28/2017  . IR PERCUTANEOUS ART THROMBECTOMY/INFUSION INTRACRANIAL INC DIAG ANGIO  07/28/2017  . NASAL SEPTOPLASTY W/ TURBINOPLASTY Bilateral 03/06/2017   Procedure: NASAL  SEPTOPLASTY WITH TURBINATE REDUCTION;  Surgeon: Linus SalmonsMcQueen, Chapman, MD;  Location: [facility] ORS;  Service: ENT;  Laterality: Bilateral;  . RADIOLOGY WITH ANESTHESIA N/A 07/27/2017   Procedure: RADIOLOGY WITH ANESTHESIA;  Surgeon: Julieanne Cottoneveshwar, Sanjeev, MD;  Location: [facility] OR;  Service: Radiology;  Laterality: N/A;    There were no vitals filed for this visit.  Subjective Assessment - 08/27/18 1528    Subjective  Pt reports doing well in general this date. He has been hvaving more isues with Right leg cramping "charlie horse" like sensations. He reports AFO is working better, but still painful, has not gone back to prosthetist yet.     Pertinent History  Pt is a 43 y/o M who presented on Jul 27, 2017 with R sided weakness, headache, slurred speech. Cranial CT scan showed L insula and frontal operculum lucency, compatible with infarction. Underwent attempted mechanical thrombectomy per Interventional Radiology that was unsuccessful, required intubation. Follow-up MRI showed cerebellar edema, midline shift. Underwent L decompressive hemicraniectomy, placement of bone flap in abdominal pocket Jan 13th, 2019. [facility] and then received HHPT until end of April, followed by therapy at this clinic, then [facility]. Pt now ambulating with quad cane at all times, limited primarily to in home ambulation. Uses riding cart at store. Pt  is now able to go up/down curb on his own. Wife assists with donning sock and shoe on R, shirt on the R. Wife assist bringing RLE into tub but pt shower independently. Pt requires occasional assist with RLE for supine<>sit. Pt's goals: reduce L low back spasms (believe to be due to weight shift to L), walking longer, to get back to work, to be able to cook a full meal, improve his balance, to be able to carry something while walking. Pt had Botox injections Aug 2019 in RLE and RUE. Plan is for repeat on Nov 1st. Pt with bone flap surgery ~1  month ago, pt denies precautions with this. Pt with h/o DVT in LLE and small PE in March 2019.    Limitations  Standing;Walking;House hold activities;Lifting    Patient Stated Goals  see above    Currently in Pain?  No/denies          Intervention This Date:   -Obstacle Course 3x  AMB 142ft, with 2 sections of cone weaving, AMB over thick cushioned mat, and 360 degree turns; performed 3 times, CGA for safety over mat, does well with cones and direction changes and turns. Requires 2-3 minutes seated recovery; on average requires 4-6 minutes to complete.  -5xSTS: hands free 15.3sec - : 34sec  -TUG: 31.2sec  *education on AFO role in preventing recurvatum, and encouraged to revisit prosthetist for adjustments to address pain.     PT Short Term Goals - 08/27/18 1540      PT SHORT TERM GOAL #1   Title  Pt will completed HEP at least 4 days/wk for improved carryover between sessions    Baseline  06/05/18: Pt has been completing his HEP 4 days/wk    Time  2    Period  Weeks    Status  Achieved        PT Long Term Goals - 08/27/18 1541      PT LONG TERM GOAL #1   Title  Pt's will improve to equal or greater than 0.6 m/s for improved safety with ambulation in the home setting    Baseline  0.22 m/s; 06/05/18: self-selected: 42.2 = 0.24 m/s, fastest: 31.8s = 0.31 m/s; 08/27/18 0.72m/s     Time  8    Period  Weeks    Status  On-going    Target Date  10/26/18      PT LONG TERM GOAL #2   Title  Pt will be able to cook a full meal with min assist for improved QOL    Baseline  Pt has been able to help make meals "most of themeal" with minA     Time  8    Period  Weeks    Status  Achieved    Target Date  10/26/18      PT LONG TERM GOAL #3   Title  Pt's will improve to at least 500 ft to demonstrate improved ambulatory endurance and speed    Baseline  210 ft; 06/05/18: 300'; 2/11: 276ft     Time  8    Period  Weeks    Status  On-going    Target Date  10/26/18       PT LONG TERM GOAL #4   Title  Pt will improve 5xSTS to equal or less than 15 seconds without UE support to demonstrate improved balance and strength    Baseline  20.15 sec with LUE assist; 06/05/18: 15.0s with LUE assist, 18.5s without UE  assist; 2/11: 15.3sec     Time  8    Period  Weeks    Status  On-going    Target Date  10/26/18      PT LONG TERM GOAL #5   Title  Pt will be able to carry objects household distances for improved independence with homemaking and cooking activities.    Baseline  Carries small objects in L hand along with quad cane; 06/05/18: still unable to carry anything in R arm    Time  8    Period  Weeks    Status  Deferred    Target Date  07/31/18      PT LONG TERM GOAL #6   Title   Pt will decrease TUG by 4 seconds in order to demonstrate decreased fall risk     Baseline  06/05/18: 35.6 seconds; 08/27/18: 31.2sec     Time  8    Period  Weeks    Status  On-going    Target Date  10/26/18            Plan - 08/27/18 1531    Clinical Impression Statement  Pt reassessed this date: Contiues to make gradual progress overall. Pt now using AFO for AMB, which improves clearance of toes and prevents further progression of Right kne recrvatum. AMB tolerance is >5 minutes now, but still limited. Pt repots more consistent energy levels with multiple bouts of actiivty.  Least improved is patients tolerance to sustained distance AMB AEB , howver patient has missed sessions in the last 4 weeks and has not been AMB outside of the house for longer distances. Pt encouraged to bring ABM intervals into his HEP 2-3x weekly, aiming for 3-5 bouts of 5 minutes AMB with 2 minutes rest between. Pt countinues to demonstrate improves strength, motor control, walkigntolerance, and independence. Pt is motivated to [employer] work with OPPT to improve longer distance AMB and short distance AMB speed.     History and Personal Factors relevant to plan of care:  (+) pt has responded well in  PT in past; family support is high; (-) severity of CVA, chronicity of impairment, and shoulder subluxation     Clinical Presentation  Stable    Clinical Presentation due to:  CLinical Tests and Measures     Clinical Decision Making  Moderate    Rehab Potential  Good    PT Frequency  2x / week    PT Duration  8 weeks    PT Treatment/Interventions  ADLs/Self Care Home Management;Aquatic Therapy;Cryotherapy;Electrical Stimulation;Iontophoresis 4mg /ml Dexamethasone;Moist Heat;Ultrasound;DME Instruction;Gait training;Stair training;Functional mobility training;Therapeutic activities;Therapeutic exercise;Balance training;Neuromuscular re-education;Cognitive remediation;Patient/family education;Orthotic Fit/Training;Manual techniques;Scar mobilization;Passive range of motion;Dry needling;Energy conservation;Splinting;Taping;Visual/perceptual remediation/compensation    PT Next Visit Plan  Continue to progress complexity of AMB obstacles and surfaces, as well as distance and time.     PT Home Exercise Plan  marching in hooklying, supine hip abduction    Consulted and Agree with Plan of Care  Patient       Patient will benefit from skilled therapeutic intervention in order to improve the following deficits and impairments:  Abnormal gait, Decreased activity tolerance, Decreased balance, Decreased cognition, Decreased coordination, Decreased endurance, Decreased knowledge of precautions, Decreased knowledge of use of DME, Decreased mobility, Decreased range of motion, Decreased safety awareness, Decreased skin integrity, Decreased scar mobility, Decreased strength, Difficulty walking, Hypomobility, Hypermobility, Increased fascial restricitons, Increased muscle spasms, Impaired perceived functional ability, Impaired flexibility, Impaired sensation, Impaired tone, Impaired UE functional use, Impaired vision/preception, Improper body  mechanics, Postural dysfunction, Pain  Visit Diagnosis: Muscle weakness  (generalized)  Hemiplegia and hemiparesis following cerebral infarction affecting right non-dominant side (HCC)  Other abnormalities of gait and mobility     Problem List Patient Active Problem List   Diagnosis Date Noted  . History of cranioplasty 03/19/2018  . PE (pulmonary thromboembolism) (HCC) 10/06/2017  . DVT (deep vein thrombosis) in pregnancy 10/06/2017  . Acute blood loss anemia   . Vascular headache   . Spastic hemiplegia of right dominant side as late effect of cerebral infarction (HCC)   . Benign essential HTN   . Frontal lobe and executive function deficit following cerebral infarction   . Adjustment disorder with mixed anxiety and depressed mood   . 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 Aphasia due to acute stroke (HCC) 08/07/2017  . B12 deficiency   . Acute respiratory failure (HCC)   . Central line infiltration (HCC)   . Endotracheal tube present   . Fever   . Leukocytosis   . S/P craniotomy 07/30/2017  . Cerebral edema (HCC) 07/28/2017  . Hyperlipidemia 07/28/2017  . Stroke (cerebrum) (HCC) 07/27/2017   4:28 PM, 08/27/18 Rosamaria LintsAllan C Leianna Barga, PT, DPT Physical Therapist - [facility]  Outpatient Physical Therapy- Main Campus [phone]    Rosamaria LintsBuccola,Mysti Haley C 08/27/2018, 4:22 PM  [facility]IN [facility] [address] [place], [place], [address] Phone: [phone]   Fax:  [phone]  Name: Jeremy GashJao Salva MRN: [id number] Date of Birth: 1976-03-08

## 2018-08-29 ENCOUNTER — Ambulatory Visit: Payer: BLUE CROSS/BLUE SHIELD | Admitting: Physical Medicine & Rehabilitation

## 2018-09-05 ENCOUNTER — Ambulatory Visit: Payer: Medicaid Other

## 2018-09-05 ENCOUNTER — Encounter: Payer: Self-pay | Admitting: Occupational Therapy

## 2018-09-05 ENCOUNTER — Ambulatory Visit: Payer: Medicaid Other | Admitting: Occupational Therapy

## 2018-09-05 DIAGNOSIS — R278 Other lack of coordination: Secondary | ICD-10-CM

## 2018-09-05 DIAGNOSIS — I69353 Hemiplegia and hemiparesis following cerebral infarction affecting right non-dominant side: Secondary | ICD-10-CM

## 2018-09-05 DIAGNOSIS — M6281 Muscle weakness (generalized): Secondary | ICD-10-CM

## 2018-09-05 DIAGNOSIS — R2689 Other abnormalities of gait and mobility: Secondary | ICD-10-CM

## 2018-09-05 NOTE — Therapy (Signed)
[facility] MAIN [facility] SERVICES [address] [place], [place], [address] Phone: [phone]   Fax:  [phone]  Physical Therapy Treatment  Patient Details  Name: Jeremy [name] MRN: [id number] Date of Birth: [birth year] Referring Provider (PT): Dr. Claudette Laws   Encounter Date: 09/05/2018  PT End of Session - 09/05/18 1524    Visit Number  14    Number of Visits  33    Date for PT Re-Evaluation  08/31/18    PT Start Time  1431    PT Stop Time  1515    PT Time Calculation (min)  44 min    Equipment Utilized During Treatment  --   Rt AFO    Activity Tolerance  Patient tolerated treatment well;Patient limited by fatigue    Behavior During Therapy  [facility] for tasks assessed/performed       Past Medical History:  Diagnosis Date  . Asthma    MILD AS A CHILD  . Deviated septum   . Dyspnea    NORMAL SPIROMETRY 02/01/17 VISIT WITH DR HEDRICK  . Hypertension   . Sleep apnea   . Stroke [facility])     Past Surgical History:  Procedure Laterality Date  . CRANIOPLASTY N/A 03/19/2018   Procedure: CRANIOPLASTY;  Surgeon: Lisbeth Renshaw, MD;  Location: [facility] OR;  Service: Neurosurgery;  Laterality: N/A;  CRANIOPLASTY  . CRANIOTOMY Left 07/29/2017   Procedure: LEFT HEMI- CRANIECTOMY WITH PLACEMENT OF BONE FLAP IN ABDOMEN;  Surgeon: Lisbeth Renshaw, MD;  Location: [facility] OR;  Service: Neurosurgery;  Laterality: Left;  . IR ANGIO INTRA EXTRACRAN SEL COM CAROTID INNOMINATE UNI R MOD SED  07/28/2017  . IR ANGIO VERTEBRAL SEL VERTEBRAL UNI R MOD SED  07/28/2017  . IR CT HEAD LTD  07/28/2017  . IR PERCUTANEOUS ART THROMBECTOMY/INFUSION INTRACRANIAL INC DIAG ANGIO  07/28/2017  . NASAL SEPTOPLASTY W/ TURBINOPLASTY Bilateral 03/06/2017   Procedure: NASAL SEPTOPLASTY WITH TURBINATE REDUCTION;  Surgeon: Linus Salmons, MD;  Location: [facility] ORS;  Service: ENT;  Laterality: Bilateral;  . RADIOLOGY WITH ANESTHESIA N/A 07/27/2017   Procedure: RADIOLOGY WITH ANESTHESIA;   Surgeon: Julieanne Cotton, MD;  Location: [facility] OR;  Service: Radiology;  Laterality: N/A;    There were no vitals filed for this visit.  Subjective Assessment - 09/05/18 1500    Subjective  Pt received immediately after OT appointment, doing well, no pain this date, reports no updates since prior session. Pt not excited about the snow prospects this date, as he is from [place] and has encountered enough snow for a lifetime per patient. Pt reports he would like to do something different from gait training this date.     Pertinent History  Pt is a 43 y/o M who presented on Jul 27, 2017 with R sided weakness, headache, slurred speech. Cranial CT scan showed L insula and frontal operculum lucency, compatible with infarction. Underwent attempted mechanical thrombectomy per Interventional Radiology that was unsuccessful, required intubation. Follow-up MRI showed cerebellar edema, midline shift. Underwent L decompressive hemicraniectomy, placement of bone flap in abdominal pocket Jan 13th, 2019. Pt went to CIR at [facility] until 2/21 and then received HHPT until end of April, followed by therapy at this clinic, then OPPT at [facility] probono clinic. Pt now ambulating with quad cane at all times, limited primarily to in home ambulation. Uses riding cart at store. Pt is now able to go up/down curb on his own. Wife assists with donning sock and shoe on R, shirt on  the R. Wife assist bringing RLE into tub but pt shower independently. Pt requires occasional assist with RLE for supine<>sit. Pt's goals: reduce L low back spasms (believe to be due to weight shift to L), walking longer, to get back to work, to be able to cook a full meal, improve his balance, to be able to carry something while walking. Pt had Botox injections Aug 2019 in RLE and RUE. Plan is for repeat on Nov 1st. Pt with bone flap surgery ~1 month ago, pt denies precautions with this. Pt with h/o DVT in LLE and small PE in March 2019.     Currently in Pain?  No/denies       Intervention This Date:  -Airex normal stance 1x30 "to easy" per patient  -Airex normal stance vertical head turns 12x3sec (supervision)  -airex horizontal head turns 1x10x3sec B -airex yellow 2000g ball catch/toss over shoulder 10x each way -leg press LLE only, central plate foot 1O103x10 105lbs  -Left seated marching VC for trunk stabilization 5lb ankle weight 3x10 -Left Leg Press Ankle Plantar Flexion 60lb 2x12 (difficulty controlled)  -chair sitting LUE cable row VC/education on terminal scapular retraction VC "first to armpit: 2x15 @ 12.5lbs   PT Short Term Goals - 08/27/18 1540      PT SHORT TERM GOAL #1   Title  Pt will completed HEP at least 4 days/wk for improved carryover between sessions    Baseline  06/05/18: Pt has been completing his HEP 4 days/wk    Time  2    Period  Weeks    Status  Achieved        PT Long Term Goals - 08/27/18 1541      PT LONG TERM GOAL #1   Title  Pt's 10mWT will improve to equal or greater than 0.6 m/s for improved safety with ambulation in the home setting    Baseline  0.22 m/s; 06/05/18: self-selected: 42.2 = 0.24 m/s, fastest: 31.8s = 0.31 m/s; 08/27/18 0.2723m/s     Time  8    Period  Weeks    Status  On-going    Target Date  10/26/18      PT LONG TERM GOAL #2   Title  Pt will be able to cook a full meal with min assist for improved QOL    Baseline  Pt has been able to help make meals "most of themeal" with minA     Time  8    Period  Weeks    Status  Achieved    Target Date  10/26/18      PT LONG TERM GOAL #3   Title  Pt's 6MWT will improve to at least 500 ft to demonstrate improved ambulatory endurance and speed    Baseline  210 ft; 06/05/18: 300'; 2/11: 26325ft     Time  8    Period  Weeks    Status  On-going    Target Date  10/26/18      PT LONG TERM GOAL #4   Title  Pt will improve 5xSTS to equal or less than 15 seconds without UE support to demonstrate improved balance and strength     Baseline  20.15 sec with LUE assist; 06/05/18: 15.0s with LUE assist, 18.5s without UE assist; 2/11: 15.3sec     Time  8    Period  Weeks    Status  On-going    Target Date  10/26/18      PT LONG TERM GOAL #5  Title  Pt will be able to carry objects household distances for improved independence with homemaking and cooking activities.    Baseline  Carries small objects in L hand along with quad cane; 06/05/18: still unable to carry anything in R arm    Time  8    Period  Weeks    Status  Deferred    Target Date  07/31/18      PT LONG TERM GOAL #6   Title   Pt will decrease TUG by 4 seconds in order to demonstrate decreased fall risk     Baseline  06/05/18: 35.6 seconds; 08/27/18: 31.2sec     Time  8    Period  Weeks    Status  On-going    Target Date  10/26/18            Plan - 09/05/18 1529    Clinical Impression Statement  Pt reports desire to work on somthing other than gait training this sessions, likely because he AMB in to hospital today rather than using transport chair. Began session working airex pad balance activity with head movements and trunk movements. Thereafter trailed some advanced strength training of Left leg and left arm to aid in maximizing utiliziy in funcitonal mobility. Pt does well with leg press, but calf strength on Left is noted to be very limited and creating some knee stability issues. Pt also performed functional row with good activsation of periscapular muscles and thoracic spine extension, Attempted to teach patient hip hinging for seated LUEdeadlift, which is dificult for patient to perform. Moving to an overhead press results in some pain in shoulder. Pt progressing well overall, would conitnue to benefit from functional strengthening.     Rehab Potential  Good    PT Frequency  2x / week    PT Duration  8 weeks    PT Treatment/Interventions  ADLs/Self Care Home Management;Aquatic Therapy;Cryotherapy;Electrical Stimulation;Iontophoresis 4mg /ml  Dexamethasone;Moist Heat;Ultrasound;DME Instruction;Gait training;Stair training;Functional mobility training;Therapeutic activities;Therapeutic exercise;Balance training;Neuromuscular re-education;Cognitive remediation;Patient/family education;Orthotic Fit/Training;Manual techniques;Scar mobilization;Passive range of motion;Dry needling;Energy conservation;Splinting;Taping;Visual/perceptual remediation/compensation    PT Next Visit Plan  Address Left shoulder spasms in anterior/lateral deltoid; if gait focused, consider obstacle course with soft/uneven surfaces; or conitnue with functional strength training.     PT Home Exercise Plan  marching in hooklying, supine hip abduction    Consulted and Agree with Plan of Care  Patient       Patient will benefit from skilled therapeutic intervention in order to improve the following deficits and impairments:  Abnormal gait, Decreased activity tolerance, Decreased balance, Decreased cognition, Decreased coordination, Decreased endurance, Decreased knowledge of precautions, Decreased knowledge of use of DME, Decreased mobility, Decreased range of motion, Decreased safety awareness, Decreased skin integrity, Decreased scar mobility, Decreased strength, Difficulty walking, Hypomobility, Hypermobility, Increased fascial restricitons, Increased muscle spasms, Impaired perceived functional ability, Impaired flexibility, Impaired sensation, Impaired tone, Impaired UE functional use, Impaired vision/preception, Improper body mechanics, Postural dysfunction, Pain  Visit Diagnosis: Muscle weakness (generalized)  Hemiplegia and hemiparesis following cerebral infarction affecting right non-dominant side (HCC)  Other abnormalities of gait and mobility     Problem List Patient Active Problem List   Diagnosis Date Noted  . History of cranioplasty 03/19/2018  . PE (pulmonary thromboembolism) (HCC) 10/06/2017  . DVT (deep vein thrombosis) in pregnancy 10/06/2017  .  Acute blood loss anemia   . Vascular headache   . Spastic hemiplegia of right dominant side as late effect of cerebral infarction (HCC)   . Benign essential HTN   .  Frontal lobe and executive function deficit following cerebral infarction   . Adjustment disorder with mixed anxiety and depressed mood   . 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 Aphasia due to acute stroke (HCC) 08/07/2017  . B12 deficiency   . Acute respiratory failure (HCC)   . Central line infiltration (HCC)   . Endotracheal tube present   . Fever   . Leukocytosis   . S/P craniotomy 07/30/2017  . Cerebral edema (HCC) 07/28/2017  . Hyperlipidemia 07/28/2017  . Stroke (cerebrum) (HCC) 07/27/2017   3:51 PM, 09/05/18 Rosamaria Lints, PT, DPT Physical Therapist - [facility]  Outpatient Physical Therapy- Main Campus [phone]     Rosamaria Lints 09/05/2018, 3:50 PM  [facility] MAIN [facility] SERVICES [address] [place], [place], [address] Phone: [phone]   Fax:  [phone]  Name: [place] MRN: [id number] Date of Birth: 08/13/1975

## 2018-09-05 NOTE — Therapy (Signed)
[facility] MAIN [facility] SERVICES [address] [place], [place], [address] Phone: [phone]   Fax:  [phone]  Occupational Therapy Treatment  Patient Details  Name: Jeremy [name] MRN: [id number] Date of Birth: 1975/11/10 No data recorded  Encounter Date: 09/05/2018  OT End of Session - 09/05/18 1612    Visit Number  14    Number of Visits  24    Date for OT Re-Evaluation  10/01/18    Authorization Type  [employer]    Authorization Time Period  visit limit 30    OT Start Time  1341    OT Stop Time  1425    OT Time Calculation (min)  44 min    Activity Tolerance  Patient tolerated treatment well    Behavior During Therapy  [facility] for tasks assessed/performed       Past Medical History:  Diagnosis Date  . Asthma    MILD AS A CHILD  . Deviated septum   . Dyspnea    NORMAL SPIROMETRY 02/01/17 VISIT WITH DR HEDRICK  . Hypertension   . Sleep apnea   . Stroke [facility])     Past Surgical History:  Procedure Laterality Date  . CRANIOPLASTY N/A 03/19/2018   Procedure: CRANIOPLASTY;  Surgeon: Lisbeth Renshaw, MD;  Location: [facility] OR;  Service: Neurosurgery;  Laterality: N/A;  CRANIOPLASTY  . CRANIOTOMY Left 07/29/2017   Procedure: LEFT HEMI- CRANIECTOMY WITH PLACEMENT OF BONE FLAP IN ABDOMEN;  Surgeon: Lisbeth Renshaw, MD;  Location: [facility] OR;  Service: Neurosurgery;  Laterality: Left;  . IR ANGIO INTRA EXTRACRAN SEL COM CAROTID INNOMINATE UNI R MOD SED  07/28/2017  . IR ANGIO VERTEBRAL SEL VERTEBRAL UNI R MOD SED  07/28/2017  . IR CT HEAD LTD  07/28/2017  . IR PERCUTANEOUS ART THROMBECTOMY/INFUSION INTRACRANIAL INC DIAG ANGIO  07/28/2017  . NASAL SEPTOPLASTY W/ TURBINOPLASTY Bilateral 03/06/2017   Procedure: NASAL SEPTOPLASTY WITH TURBINATE REDUCTION;  Surgeon: Linus Salmons, MD;  Location: [facility] ORS;  Service: ENT;  Laterality: Bilateral;  . RADIOLOGY WITH ANESTHESIA N/A 07/27/2017   Procedure: RADIOLOGY WITH ANESTHESIA;  Surgeon: Julieanne Cotton, MD;   Location: [facility] OR;  Service: Radiology;  Laterality: N/A;    There were no vitals filed for this visit.  Subjective Assessment - 09/05/18 1612    Subjective   Pt. plans [place].    Patient is accompained by:  Family member    Pertinent History  Pt is a 43 y/o M who presented on Jul 27, 2017 with R sided weakness, headache, slurred speech. Cranial CT scan showed L insula and frontal operculum lucency, compatible with infarction. Underwent attempted mechanical thrombectomy per Interventional Radiology that was unsuccessful, required intubation. Follow-up MRI showed cerebellar edema, midline shift. Underwent L decompressive hemicraniectomy, placement of bone flap in abdominal pocket Jan 13th, 2019. Pt went to CIR at [facility] until 2/21 and then received HHPT until end of April, followed by therapy at this clinic, then OPPT at [facility] probono clinic once his insurance benefits ran out.  Patient now has new insurance and has returned to therapy for OT evaluation.      Patient Stated Goals  Patient would like to regain ability to take care of self.    Currently in Pain?  Yes    Pain Score  1     Pain Location  Shoulder    Pain Orientation  Right    Pain Descriptors / Indicators  Aching  OT Treatment   Neuro muscular re-education:   Pt. worked on weightbearing through the RUE, and hand with alignment provided at the humeral head. Pt. Worked on reaching with the LUE crossing midline to place clips at various high heights, Pt. worked on reaching forward, and organizing letters to make 5 and 7 letter words.     Therapeutic Exercise:  Pt. tolerated PROM to all joint ranges of the RUE, hand, and digits in preparation for engaging his RUE during ADLS.  Manual Therapy:  Pt. Tolerated scapular mobes for elevation depression, and abduction/rotation to prepare the UE for ROM.                        OT Education - 09/05/18 1612    Education  provided  Yes    Education Details  RUE functioning    Person(s) Educated  Patient    Methods  Explanation;Demonstration;Verbal cues    Comprehension  Verbalized understanding;Returned demonstration          OT Long Term Goals - 08/15/18 0856      OT LONG TERM GOAL #1   Title  Patient will demonstrate improved balance to perform clothing negotiation with dressing/toileting with supervision only.     Baseline  min assist    Time  12    Period  Weeks    Status  New      OT LONG TERM GOAL #2   Title  Patient will demonstrate ability to manage shoulder brace with modified independence.    Baseline  unable at eval    Time  4    Period  Weeks    Status  New      OT LONG TERM GOAL #3   Title  Patient will demonstrate trace movement of elbow extension with facilitation to work towards neuromuscular reeducation for active arm use on the right .    Baseline  no active movement at eval    Time  12    Period  Weeks    Status  New      OT LONG TERM GOAL #4   Title  Patient will demonstate understanding of compensatory strategies to protect right UE from harm during engagement  in daily activities.     Baseline  sensation absent in right UE and at risk for injury.    Time  3    Period  Weeks    Status  New      OT LONG TERM GOAL #5   Title  Patient will complete cutting meat one handed with modified independence with adaptive equipment as needed.     Baseline  unable at eval    Time  12    Period  Weeks    Status  New      OT LONG TERM GOAL #6   Title  Patient will complete UB dressing with modified independence    Baseline  min assist     Time  12    Period  Weeks    Status  New      OT LONG TERM GOAL #7   Title  Patient will complete shoe and sock donning with modified independence.    Baseline  unable at eval    Time  12    Period  Weeks    Status  New      OT LONG TERM GOAL #8   Title  Patient will complete lower body dressing with occasional assist for  buttons/zippers.     Baseline  min assist    Time  12    Period  Weeks    Status  New            Plan - 09/05/18 1614    Clinical Impression Statement Pt.  continues to present with no initiation of active movement in his RUE. Pt. continues to present with extensor tone in his right elbow, and flexor tone in his right digits. Pt. continues to work on improving UE strength, and [facility] skills for improved engagement in ADL, and IADL functioning.    Occupational Profile and client history currently impacting functional performance  absent sensation in RUE from shoulder to digits, no active right UE movement, decreased right peripheral vision, has 206 month old infant at home, recent move from 3rd story apartment, unable to work, bone flap replaced in 9/19    Occupational performance deficits (Please refer to evaluation for details):  ADL's;IADL's;Leisure;Work    Electrical engineerehab Potential  Fair    Current Impairments/barriers affecting progress:  positive:  motivation, family support.  Negative:  young age, lack of sensation on the right for light touch, sharp/dull, deep pressure, hot/cold, processing delay cognitively    OT Frequency  2x / week    OT Duration  12 weeks    OT Treatment/Interventions  Self-care/ADL training;Therapeutic exercise;Neuromuscular education;Patient/family education;Building services engineerunctional Mobility Training;Therapeutic activities;Balance training;DME and/or AE instruction;Manual Therapy;Passive range of motion    Clinical Decision Making  Several treatment options, min-mod task modification necessary    Consulted and Agree with Plan of Care  Patient;Family member/caregiver       Patient will benefit from skilled therapeutic intervention in order to improve the following deficits and impairments:  Abnormal gait, Decreased cognition, Decreased knowledge of use of DME, Increased edema, Pain, Decreased coordination, Decreased mobility, Impaired sensation, Decreased activity tolerance, Decreased  endurance, Decreased range of motion, Decreased strength, Impaired tone, Decreased balance, Decreased knowledge of precautions, Decreased safety awareness, Difficulty walking, Impaired UE functional use  Visit Diagnosis: Muscle weakness (generalized)  Other lack of coordination    Problem List Patient Active Problem List   Diagnosis Date Noted  . History of cranioplasty 03/19/2018  . PE (pulmonary thromboembolism) (HCC) 10/06/2017  . DVT (deep vein thrombosis) in pregnancy 10/06/2017  . Acute blood loss anemia   . Vascular headache   . Spastic hemiplegia of right dominant side as late effect of cerebral infarction (HCC)   . Benign essential HTN   . Frontal lobe and executive function deficit following cerebral infarction   . Adjustment disorder with mixed anxiety and depressed mood   . 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 Aphasia due to acute stroke (HCC) 08/07/2017  . B12 deficiency   . Acute respiratory failure (HCC)   . Central line infiltration (HCC)   . Endotracheal tube present   . Fever   . Leukocytosis   . S/P craniotomy 07/30/2017  . Cerebral edema (HCC) 07/28/2017  . Hyperlipidemia 07/28/2017  . Stroke (cerebrum) (HCC) 07/27/2017    Olegario MessierElaine Dakwon Wenberg, MS, OTR/L 09/05/2018, 4:19 PM  [facility] MAIN [facility] SERVICES [address] [place], [place], [address] Phone: [phone]   Fax:  [phone]  Name: Jeremy [name] MRN: [id number] Date of Birth: 1976/01/09

## 2018-09-10 ENCOUNTER — Ambulatory Visit: Payer: Medicaid Other | Admitting: Physical Medicine & Rehabilitation

## 2018-09-16 ENCOUNTER — Ambulatory Visit: Payer: Self-pay | Admitting: Physical Medicine & Rehabilitation

## 2018-09-19 ENCOUNTER — Ambulatory Visit: Payer: Self-pay

## 2018-09-19 ENCOUNTER — Ambulatory Visit: Payer: Self-pay | Attending: Physical Medicine & Rehabilitation | Admitting: Occupational Therapy

## 2018-09-30 ENCOUNTER — Ambulatory Visit: Payer: Self-pay

## 2018-10-03 ENCOUNTER — Ambulatory Visit: Payer: Medicaid Other | Admitting: Occupational Therapy

## 2018-10-08 ENCOUNTER — Encounter: Payer: Self-pay | Admitting: Occupational Therapy

## 2018-10-08 NOTE — Therapy (Signed)
[facility] MAIN [facility] SERVICES [address] [place], [place], [address] Phone: [phone]   Fax:  [phone]  Patient Details  Name: Jeremy [name] MRN: [id number] Date of Birth: 09/14/1975 Referring Provider:  No ref. provider found  Encounter Date: 10/08/2018   Attempted to Reach out to the pt. via the telephone to check in on the pt. during this pandemic. Left a voicemail message.  Olegario Messier, MS, OTR/L 10/08/2018, 2:50 PM  [facility] MAIN [facility] SERVICES [address] [place], [place], [address] Phone: [phone]   Fax:  [phone]

## 2018-10-15 ENCOUNTER — Other Ambulatory Visit: Payer: Self-pay

## 2018-10-16 ENCOUNTER — Encounter: Payer: Medicaid Other | Admitting: Occupational Therapy

## 2018-10-16 ENCOUNTER — Encounter: Payer: Self-pay | Admitting: Adult Health

## 2018-10-16 ENCOUNTER — Telehealth: Payer: Self-pay | Admitting: Adult Health

## 2018-10-16 ENCOUNTER — Other Ambulatory Visit: Payer: Self-pay | Admitting: Adult Health

## 2018-10-16 ENCOUNTER — Ambulatory Visit: Payer: Medicaid Other

## 2018-10-16 ENCOUNTER — Other Ambulatory Visit: Payer: Self-pay

## 2018-10-16 ENCOUNTER — Encounter

## 2018-10-16 ENCOUNTER — Ambulatory Visit (INDEPENDENT_AMBULATORY_CARE_PROVIDER_SITE_OTHER): Payer: Self-pay | Admitting: Adult Health

## 2018-10-16 DIAGNOSIS — I69351 Hemiplegia and hemiparesis following cerebral infarction affecting right dominant side: Secondary | ICD-10-CM

## 2018-10-16 DIAGNOSIS — I82412 Acute embolism and thrombosis of left femoral vein: Secondary | ICD-10-CM

## 2018-10-16 DIAGNOSIS — Z9889 Other specified postprocedural states: Secondary | ICD-10-CM

## 2018-10-16 DIAGNOSIS — E785 Hyperlipidemia, unspecified: Secondary | ICD-10-CM

## 2018-10-16 DIAGNOSIS — I10 Essential (primary) hypertension: Secondary | ICD-10-CM

## 2018-10-16 DIAGNOSIS — I63512 Cerebral infarction due to unspecified occlusion or stenosis of left middle cerebral artery: Secondary | ICD-10-CM

## 2018-10-16 NOTE — Progress Notes (Signed)
Lower extremity ultrasound ordered to follow-up on previously seen left femoral DVT on Eliquis therapy.  If negative, will discontinue Eliquis and switch to aspirin 325 mg daily

## 2018-10-16 NOTE — Progress Notes (Signed)
[facility] [address] [place]. [place] [address] [phone]     Virtual Visit via Telephone Note  I connected with Jeremy [name] on 10/16/18 at  7:45 AM EDT by telephone located at [facility] and verified that I am speaking with the correct person using two identifiers who reports being located in his own home along with discussion by his wife, Jeremy [name].    I discussed the limitations, risks, security and privacy concerns of performing an evaluation and management service by telephone and the availability of in person appointments. I also discussed with the patient that there may be a patient responsible charge related to this service. The patient expressed understanding and agreed to proceed.   History of Present Illness:  Jeremy [name] is a 43 y.o. 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  He was initially scheduled for face-to-face office visit today at this time but due to COVID19, face-to-face office visit rescheduled for non-face-to-face telephone visit.   Prior follow-up visit on 02/07/2018 with residual deficits of spastic right hemiplegia and planning on undergoing cranioplasty.  Follow-up visit scheduled after that time but unfortunately patient canceled and was a no-show to an additional rescheduled visit.  He underwent cranioplasty on 03/19/2018 without complication.  He was restarted on Eliquis after procedure.  He has continued on Eliquis since 09/2017 after evidence of DVT in left common femoral vein through the left tibial veins.  Spoke to [place], patient's wife, regarding update of patient's condition.  Patient was in the background asking for answering questions.  He continues to have residual left hemiparesis but has been participating in PT/OT at [facility].  Unfortunately on hold at this time due to  pandemic but plans on restarting once able.  Wife does endorse ongoing exercises at home as recommended during therapy sessions.  He also has been receiving Botox injections for left spasticity which has been providing benefit.  He has continued on Eliquis and aspirin without side effects of bleeding or bruising.  Wife is questioning duration of Eliquis as PCP deferred duration determination to our office per wife.  He does have appointment scheduled tomorrow with Dr. Kieth Brightly neuropsychology for cognitive evaluation.  No further concerns at this time.  Denies new or worsening stroke/TIA symptoms.    Observations/Objective:  Limited exam due to visit type  Due to cognitive deficits, wife provided majority of history the patient in background asking and answering questions appropriately  All recent lab work and imaging personally reviewed   Assessment and Plan:  Jeremy [name] is a 43 year old Asian male with large left middle cerebral artery infarct  with cytotoxic edema of cryptogenic etiology status post hemicraniectomy in January 2019 and cranioplasty in 03/2018 with residual spastic right hemiplegia and cognitive deficit.   He was also found to have DVT in 09/2017 with initiation of Eliquis.  Per telephone visit, he has been stable with improvement of spastic right hemiparesis with continuation of therapies and Botox.  -Continue Eliquis and aspirin along with Crestor for secondary stroke prevention -Discussion regarding typical anticoagulation for DVT duration 6 months and will discuss with Dr. Pearlean Brownie in regards to discontinuation at this time vs repeating lower extremity ultrasound prior to discontinuation.  No additional imaging has been performed since initially finding DVT in 09/2017 (last seen in 01/2018 in this office with follow-up appointment cancellation and no-show to reschedule visit) -Restart  therapies once able for ongoing deficits along with continued Botox by Dr. Wynn Banker -scheduled for  evaluation by neuropsychology Dr. Kieth Brightly for cognitive deficit post stroke -Continue to follow with PCP for HLD and HTN management -Maintain strict control of hypertension with blood pressure goal below 130/90, diabetes with hemoglobin A1c goal below 6.5% and cholesterol with LDL cholesterol (bad cholesterol) goal below 70 mg/dL. I also advised the patient to eat a healthy diet with plenty of whole grains, cereals, fruits and vegetables, exercise regularly and maintain ideal body weight.     Follow Up Instructions:  Follow-up in 6 months or call earlier if needed.  If remains stable, can discharge at that time     I discussed the assessment and ongoing treatment plan with the patient and wife.  The patient and wife were provided an opportunity to ask questions and all were answered to their satisfaction. The patient agreed with the plan and verbalized an understanding of the instructions.   I provided 21 minutes of non-face-to-face time during this encounter.    George Hugh, AGNP-BC  [facility] [address] [place], [place] [address]  Phone [phone] Fax [phone] Note: This document was prepared with digital dictation and possible smart phrase technology. Any transcriptional errors that result from this process are unintentional.

## 2018-10-16 NOTE — Progress Notes (Signed)
Sent to [place] to for scheduling .

## 2018-10-16 NOTE — Telephone Encounter (Signed)
Called and spoke to patient's wife and relayed that [facility] is not taking patient's at this time . [facility] sending patient's to [address]. Telephone  [phone] .[address] Location is already closed I will call patient's wife 10/17/2018 with apt. Thanks Annabelle Harman.

## 2018-10-16 NOTE — Progress Notes (Signed)
I agree with the above plan 

## 2018-10-17 ENCOUNTER — Other Ambulatory Visit: Payer: Self-pay

## 2018-10-17 ENCOUNTER — Encounter: Payer: BLUE CROSS/BLUE SHIELD | Attending: Physical Medicine & Rehabilitation | Admitting: Psychology

## 2018-10-17 DIAGNOSIS — I69314 Frontal lobe and executive function deficit following cerebral infarction: Secondary | ICD-10-CM

## 2018-10-17 DIAGNOSIS — I69319 Unspecified symptoms and signs involving cognitive functions following cerebral infarction: Secondary | ICD-10-CM | POA: Insufficient documentation

## 2018-10-17 DIAGNOSIS — Z8249 Family history of ischemic heart disease and other diseases of the circulatory system: Secondary | ICD-10-CM | POA: Insufficient documentation

## 2018-10-17 DIAGNOSIS — G4730 Sleep apnea, unspecified: Secondary | ICD-10-CM | POA: Insufficient documentation

## 2018-10-17 DIAGNOSIS — I69351 Hemiplegia and hemiparesis following cerebral infarction affecting right dominant side: Secondary | ICD-10-CM | POA: Diagnosis not present

## 2018-10-17 DIAGNOSIS — F4320 Adjustment disorder, unspecified: Secondary | ICD-10-CM | POA: Diagnosis not present

## 2018-10-17 DIAGNOSIS — I10 Essential (primary) hypertension: Secondary | ICD-10-CM | POA: Insufficient documentation

## 2018-10-17 IMAGING — CT CT HEAD W/O CM
4 of 8 series · 16 of 47 positions shown, 18 images · non-contrast
Comparison: CT head dated August 14, 2017. CT neck dated Isaac

CLINICAL DATA: Fall out of a chair.

EXAM:
CT HEAD WITHOUT CONTRAST
CT CERVICAL SPINE WITHOUT CONTRAST
TECHNIQUE: Multidetector CT imaging of the head and cervical spine was
performed following the standard protocol without intravenous
contrast. Multiplanar CT image reconstructions of the cervical spine
were also generated.

[Series 5: head bone · axial · 0.46mm/px · z∈[-79,-3]mm · 4 of 90 slices shown]
[im 13/90  bone]
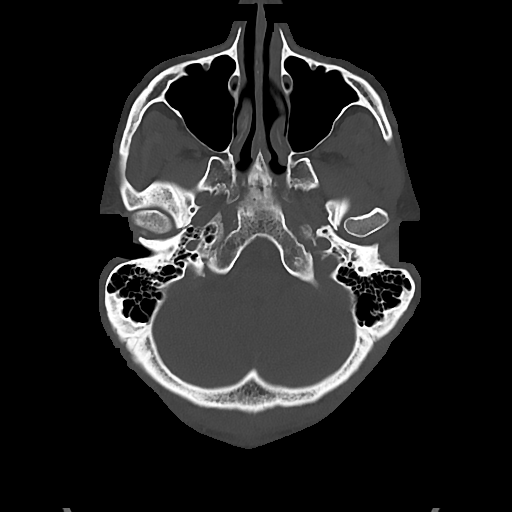
[im 26/90  bone]
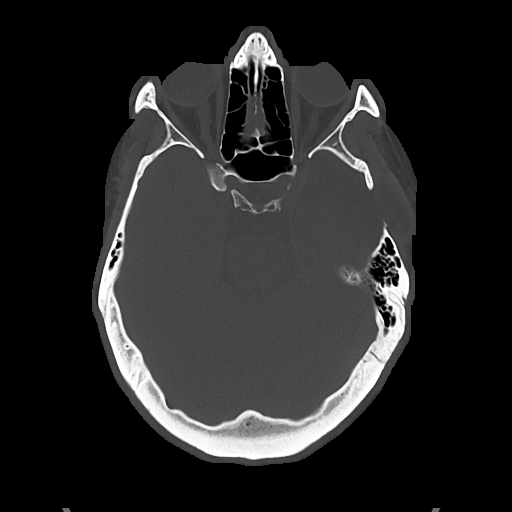
[im 39/90  bone]
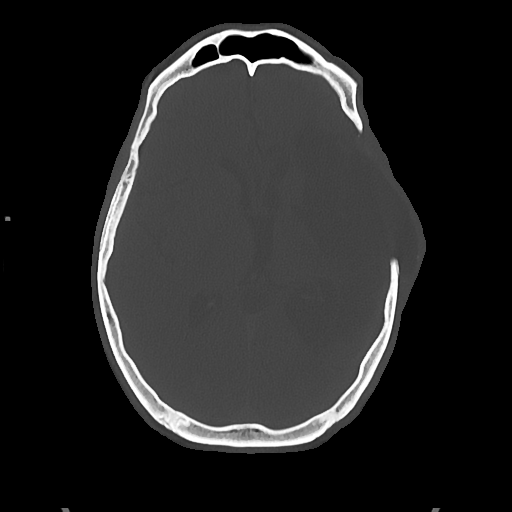
[im 51/90  bone]
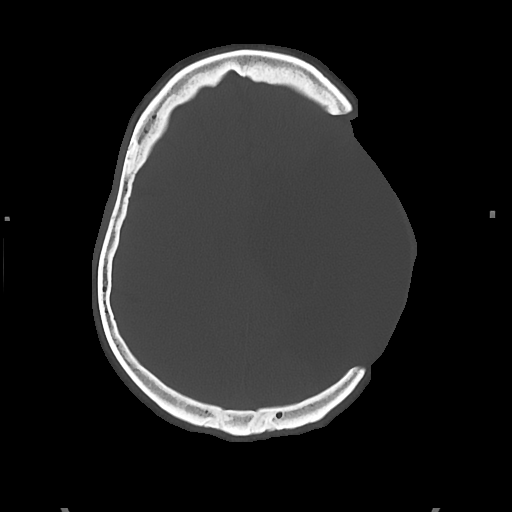

[Series 6: cor soft · coronal · 0.37mm/px · 3 of 77 slices shown]
[im 16/77  brain]
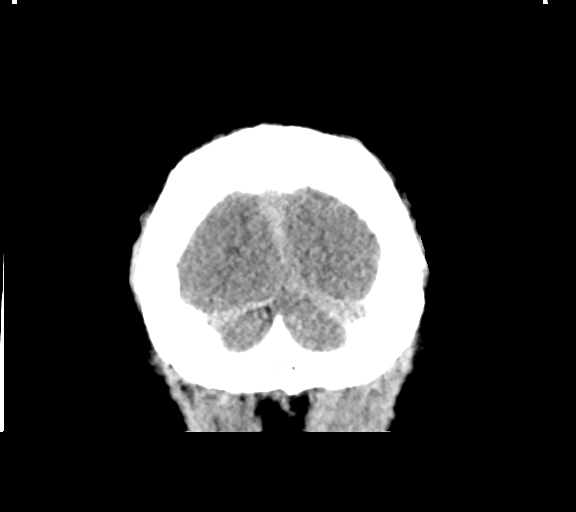
[im 31/77  brain]
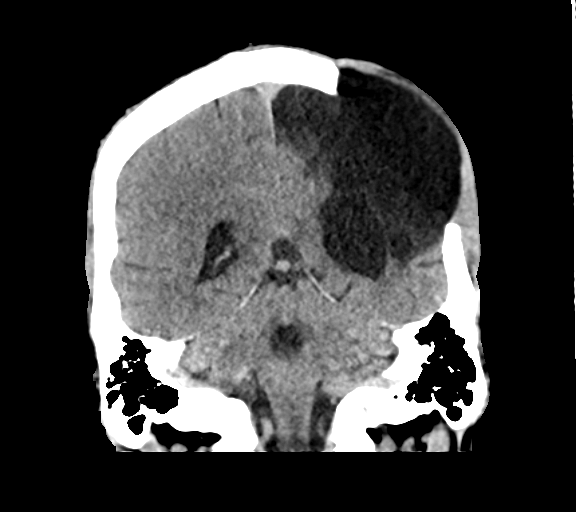
[im 46/77  brain]
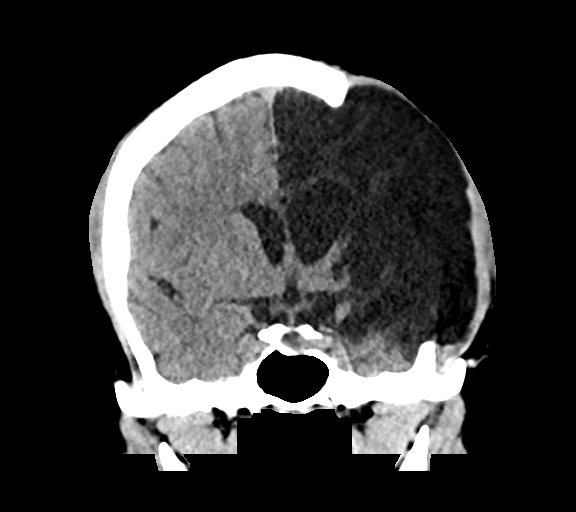

[Series 7: sag soft · sagittal · 0.35mm/px · 2 of 64 slices shown]
[im 22/64  brain]
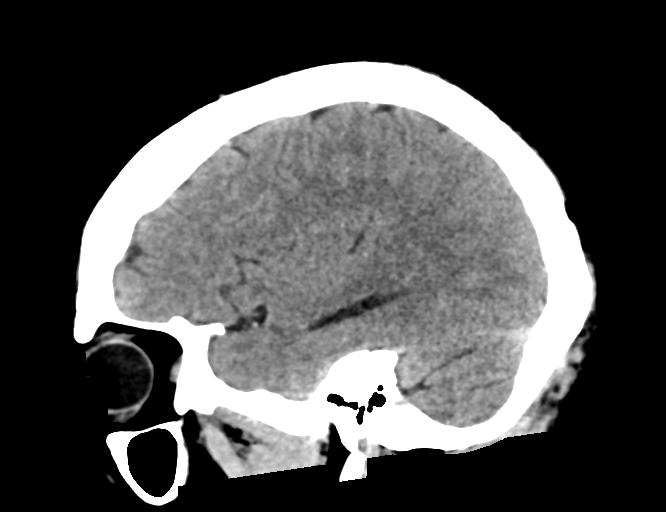
[im 43/64  brain]
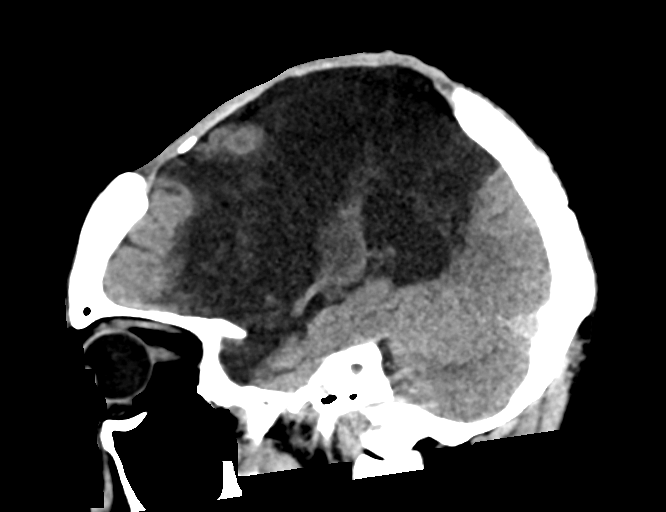

[Series 12: orthogonal axials · axial · 0.21mm/px · z∈[-266,-121]mm · 7 of 100 slices shown, 9 images]
[im 13/100  brain]
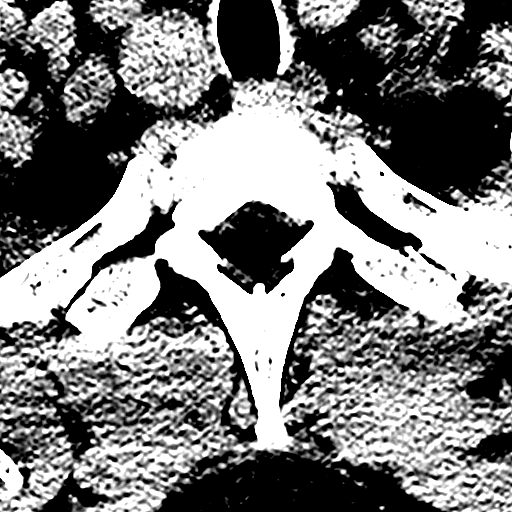
[im 13/100  bone]
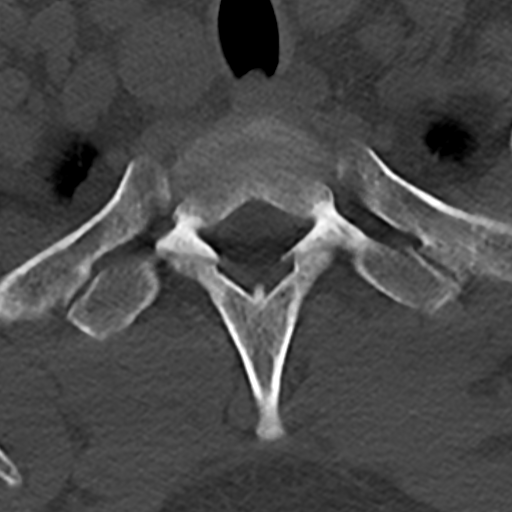
[im 25/100  brain]
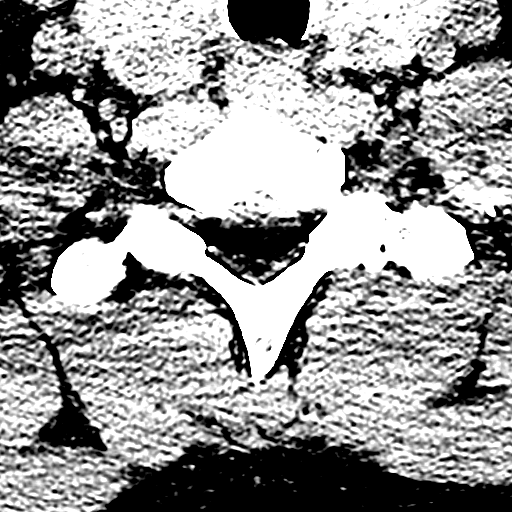
[im 38/100  brain]
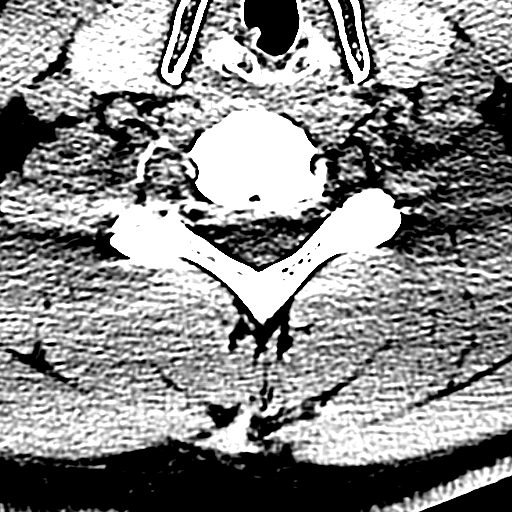
[im 50/100  brain]
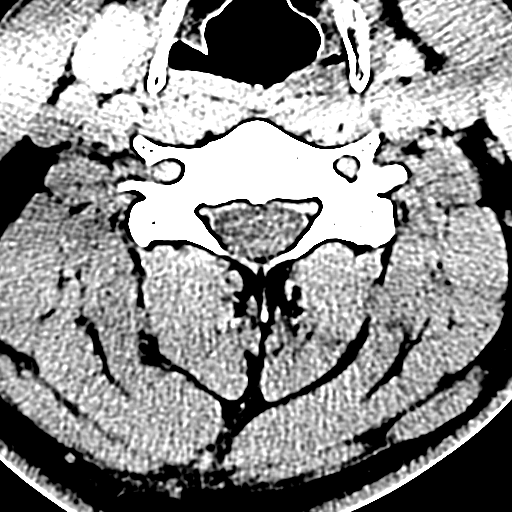
[im 62/100  brain]
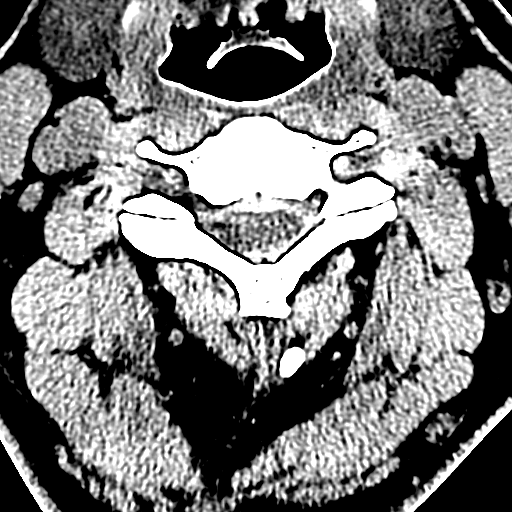
[im 62/100  bone]
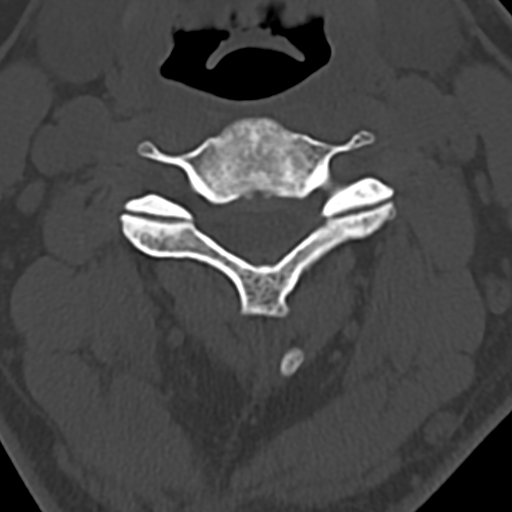
[im 75/100  brain]
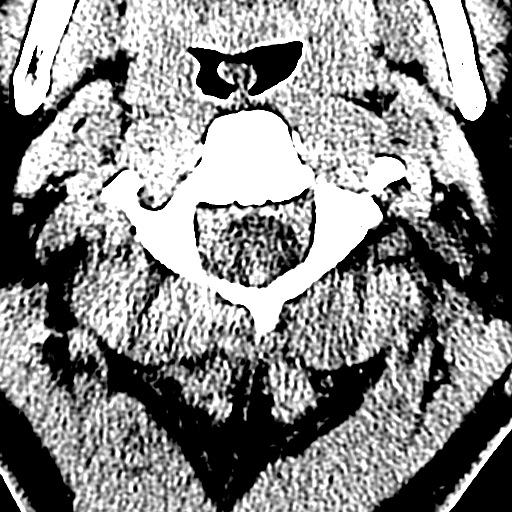
[im 87/100  brain]
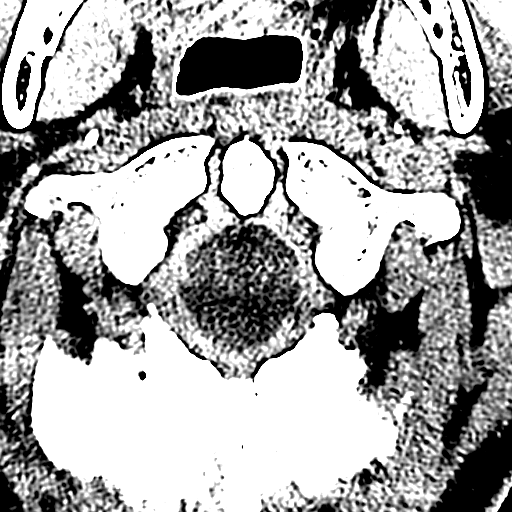

[16 of 47 positions shown; findings below may reference images not displayed]

FINDINGS: CT HEAD FINDINGS

Brain: Expected evolution of the large left MCA and ACA territory
infarcts, now with encephalomalacia. Compensatory ex vacuo
dilatation of the left lateral ventricle. There is 2 mm leftward
midline shift, previously 4 mm. Atrophy and Wallerian degeneration
of the left corticospinal tracts.

No evidence of acute infarction, hemorrhage, hydrocephalus,
extra-axial collection or mass lesion.

Vascular: No hyperdense vessel or unexpected calcification.

Skull: Postsurgical changes related to prior decompressive left
hemicraniectomy with resolved overlying scalp soft tissue swelling.
No fracture or focal lesion.

Sinuses/Orbits: None.

Other: None.

CT CERVICAL SPINE FINDINGS

Alignment: Straightening of the normal cervical lordosis. No
traumatic malalignment.

Skull base and vertebrae: No acute fracture. No primary bone lesion
or focal pathologic process.

Soft tissues and spinal canal: No prevertebral fluid or swelling. No
visible canal hematoma.

Disc levels: Mild disc height loss and uncovertebral hypertrophy at
C4-C5 and C5-C6.

Upper chest: Negative.

Other: Unchanged 2.6 cm right thyroid nodule.
IMPRESSION: 1.  No acute intracranial abnormality.
2.  No acute cervical spine fracture.
3. Expected evolution of the large left MCA and ACA territory
infarcts, now with encephalomalacia.
4. Unchanged 2.6 cm right thyroid nodule. Non-emergent thyroid
ultrasound is recommended. This follows ACR consensus guidelines:
Managing Incidental Thyroid Nodules Detected on Imaging: White Paper
of [REDACTED]. [HOSPITAL]

## 2018-10-18 ENCOUNTER — Telehealth (HOSPITAL_COMMUNITY): Payer: Self-pay | Admitting: Rehabilitation

## 2018-10-18 ENCOUNTER — Ambulatory Visit (HOSPITAL_COMMUNITY)
Admission: RE | Admit: 2018-10-18 | Discharge: 2018-10-18 | Disposition: A | Discharge status: Home / Self Care | Payer: BLUE CROSS/BLUE SHIELD | Source: Ambulatory Visit | Attending: Family | Admitting: Family

## 2018-10-18 ENCOUNTER — Telehealth: Payer: Self-pay | Admitting: Adult Health

## 2018-10-18 DIAGNOSIS — I82412 Acute embolism and thrombosis of left femoral vein: Secondary | ICD-10-CM

## 2018-10-18 NOTE — Telephone Encounter (Signed)
The above patient or their representative was contacted and gave the following answers to these questions:         Do you have any of the following symptoms? No  Fever                    Cough                   Shortness of breath  Do  you have any of the following other symptoms? No   muscle pain         vomiting,        diarrhea        rash         weakness        red eye        abdominal pain         bruising          bruising or bleeding              joint pain           severe headache    Have you been in contact with someone who was or has been sick in the past 2 weeks? No  Yes                 Unsure                         Unable to assess   Does the person that you were in contact with have any of the following symptoms? No  Cough         shortness of breath           muscle pain         vomiting,            diarrhea            rash            weakness           fever            red eye           abdominal pain           bruising  or  bleeding                joint pain                severe headache               Have you  or someone you have been in contact with traveled internationally in th last month?  No       If yes, which countries?   Have you  or someone you have been in contact with traveled outside [place] in th last month?  No       If yes, which state and city?   COMMENTS OR ACTION PLAN FOR THIS PATIENT:          

## 2018-10-18 NOTE — Telephone Encounter (Signed)
Crystal from [facility] called again needing documentation on the pt for the DVT study he is to get today at 1 pm Please advise.

## 2018-10-18 NOTE — Telephone Encounter (Signed)
Dr. Pearlean Brownie, This is the patient I asked you about regarding discontinuation of Eliquis for previously found DVT approximately 13 months prior.  He recommended obtaining lower extremity ultrasound but due to pandemic, ultrasound can only be obtained if for emergent reasons.  Should he continue Eliquis at this time until we are able to obtain lower extremity ultrasound or can we discontinue Eliquis at this time?  Please let me know your thoughts and I will reach out to patient. Thanks, [facility]

## 2018-10-18 NOTE — Telephone Encounter (Signed)
I believe the guidelines suggest 6-9 months of anticoagulation is enough unless there is recurrent DVT. Since the patient has already been on anticoagulation for 13 months I think it is reasonable to discontinue it and obtain a lower extremity venous Doppler when feasible in the rare case that he has persistent DVT could always switch him back to he needs to stay on aspirin 325 mg after stopping eliquis

## 2018-10-18 NOTE — Telephone Encounter (Signed)
Jeremy [name] from [facility] called stating that they are only seeing emergency patients in the office due to the COVID-19 pandemic. She would like to know if the order that was sent can be pushed back. Please advise.

## 2018-10-18 NOTE — Telephone Encounter (Signed)
I attempted to call vascular vein without success, we share epic chart,

## 2018-10-20 NOTE — Telephone Encounter (Signed)
Left lower extremity duplex completed on 10/18/18. results: No acute DVT noted. Chronic DVT throughout the CFV, FV, and popliteal. Scan of the iliac veins reveal occluded left external iliac vein and possible thrombus in the distal common iliac vein. IVC and right iliac vessels are patent. Due to findings and recommendations by Dr, Pearlean Brownie , recommend to continue to Eliquis and referral will be placed to vascular surgery

## 2018-10-20 NOTE — Addendum Note (Signed)
Addended by: George Hugh on: 10/20/2018 08:48 PM   Modules accepted: Orders

## 2018-10-20 NOTE — Addendum Note (Signed)
Addended by: George Hugh on: 10/20/2018 08:39 PM   Modules accepted: Orders

## 2018-10-21 NOTE — Telephone Encounter (Signed)
Venous Doppler Low ext has been completed.

## 2018-10-21 NOTE — Telephone Encounter (Signed)
I called the wife that per JEssica NP no acute DVt was noted.There is chronic DVT throughout the CFV and FV and popliteal. Its recommend to continue eliquis for the DVT and a referral was sent to Vascular for further management. I stated PCP can prescribed eliquis ongoing until seen by vascular md. I stated to allow 5 working days to be contacted byt vascular surgery. The wife verbalized understanding.

## 2018-10-28 NOTE — Therapy (Signed)
[facility] MAIN [facility] SERVICES [address] [place], [place], [address] Phone: [phone]   Fax:  [phone]  Patient Details  Name: Jeremy [name] MRN: [id number] Date of Birth: Oct 16, 1975 Referring Provider:  No ref. provider found  Encounter Date: 10/28/2018   The [facility] outpatient clinics are remaining closed at this time due to the COVID-19 epidemic. Upon consideration of patient's status and needs, PT contacted wife to discuss options for continued therapy. Pt is not appropriate for telemedicine given the degree of guarding and hands on cues required for exercise. Discussed with wife the options for home health therapy. Wife states that she will discuss with patient and return call to therapist. If pt is interested in home health the referring physician will be contacted to request a home health referral in order to help provide continued rehabilitation services care for the patient.     Sharalyn Ink Devina Bezold PT, DPT, GCS  [name] 10/28/2018, 12:07 PM  [facility] MAIN [facility] SERVICES [address] [place], [place], [address] Phone: [phone]   Fax:  [phone]

## 2018-10-30 ENCOUNTER — Ambulatory Visit: Payer: Medicaid Other

## 2018-10-30 ENCOUNTER — Encounter: Payer: Medicaid Other | Admitting: Occupational Therapy

## 2018-11-13 ENCOUNTER — Encounter: Payer: Medicaid Other | Admitting: Occupational Therapy

## 2018-11-13 ENCOUNTER — Ambulatory Visit: Payer: Medicaid Other

## 2018-11-18 ENCOUNTER — Encounter: Payer: Self-pay | Admitting: Psychology

## 2018-11-18 NOTE — Telephone Encounter (Signed)
Pts wife called stating the vein and vascular office is only seeing urgent cases- wanting to make sure the pt is ok to wait or if a new referral needs to be send over as urgent. Please advise.

## 2018-11-18 NOTE — Progress Notes (Signed)
Neuropsychological Consultation   Patient:   Jeremy [name]   DOB:   [birth year]  MR Number:  [id number]  Location:  [facility] FOR PAIN AND REHABILITATIVE MEDICINE [facility] PHYSICAL MEDICINE AND REHABILITATION [address] [place], [address] [id number] [facility] [place] [address] Dept: [phone]           Date of Service:   11/18/2018  Start Time:   2 PM End Time:   3 PM  Provider/Observer:  Arley Phenix, Psy.D.       Clinical Neuropsychologist       Billing Code/Service: Neurobehavioral status exam  Chief Complaint:    Jeremy [name] is a 43 year old male referred by Dr. Wynn Banker for neuropsychological consultation.  The patient was admitted to the hospital after he presented on 07/27/2006 with right-sided weakness, headache, facial droop and slurred speech.  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  There was also cerebral edema with midline shift visualized.  A decompressive hemicraniotomy was performed.  The patient had significant aphasia/expressive language symptoms and near complete right arm and right leg motor impairments.  There was a great deal of impulse control that was almost completely verbal in nature initially.  The patient was initially verbally resistant and negative interactions during some therapy sessions were noted.  Receptive language functions did appear to be intact.  There was apparent significant depression and anxiety symptoms due to sudden loss of function.  The patient was treated in the comprehensive inpatient rehabilitation program.  During that time I had the opportunity to work with the patient's 3 times that this is a follow-up from that 2019.    Reason for Service:  Jeremy [name] is a 43 year old male referred by Dr. Wynn Banker for neuropsychological consultation.  The patient was admitted to the hospital after he presented on 07/27/2006 with right-sided weakness, headache, facial droop and slurred speech.  It  was determined that the patient had M1 occlusion with intermediate left MCA infarction.  There was also cerebral edema with midline shift visualized.  A decompressive hemicraniotomy was performed.  The patient had significant aphasia/expressive language symptoms and near complete right arm and right leg motor impairments.  There was a great deal of impulse control that was almost completely verbal in nature initially.  The patient was initially verbally resistant and negative interactions during some therapy sessions were noted.  Receptive language functions did appear to be intact.  There was apparent significant depression and anxiety symptoms due to sudden loss of function.  The patient was treated in the comprehensive inpatient rehabilitation program.  During that time I had the opportunity to work with the patient's 3 times that this is a follow-up from that 2019.  The patient was referred for psychotherapeutic/neuropsychological consultation in consulting.  The patient is described as continuing to have significant anxiety and have some significant lack of motivation and desire.  The patient's family deny that it appears to be significant depression but simply no desire internal self initiation.  The patient is described as being completely fine sitting on the sofa and letting his wife do everything.  The patient continues to be rather resistant and denying of some of the level of deficits that he has.  He continues to have significant motor deficits in his right arm but less so in his right leg but also has some paralysis in his right leg.  The patient is described as having significant executive functioning deficits.  The patient was treated through  [facility] system starting July 27, 2017 through September 07, 2017.  The patient initially started inpatient rehab while in the hospital and continues with in-home rehab then graduating to outpatient rehab.  The patient is described as sleeping fine and  having good appetite.  There are some memory deficits noted primarily as a delay with short-term memory recall.  The patient's wife is his primary caretaker.  Current Status:  The patient is described as having significant self initiation deficits along with paralysis on the right side of his body.  The patient is rather resistant to any efforts to try to get him to engage or do things although he is assisting on going back to work even though he has a significant deficits.  The patient has significant neuropsychological and cognitive deficits that continue.  The patient has significant executive functioning deficits along with his severe motor deficits.  The patient has almost no self initiation of behaviors unless he is very agitated emotionally.  The patient is currently disabled and would not be able to perform his ongoing job duties.  Reliability of Information: The information is derived from 1 hour face-to-face clinical interview as well as review of available medical records.  Behavioral Observation: Jeremy [name]  presents as a 43 y.o.-year-old Right  Male who appeared his stated age. his dress was Appropriate and he was Well Groomed and his manners were Appropriate to the situation.  his participation was indicative of Resistant behaviors.  There were any physical disabilities noted.  he displayed an inappropriate level of cooperation and motivation.     Interactions:    Active Redirectable and Resistant  Attention:   abnormal and attention span appeared shorter than expected for age  Memory:   abnormal; remote memory intact, recent memory impaired  Visuo-spatial:  not examined  Speech (Volume):  low  Speech:    non-fluent aphasia  Thought Process:  Coherent and Tangential  Though Content:  WNL; not suicidal and not homicidal  Orientation:   person, place and time/date  Judgment:   Poor  Planning:   Poor  Affect:    Defensive and  Irritable  Mood:    Dysphoric  Insight:   Shallow  Intelligence:   high  Marital Status/Living: The patient was born and raised in [place] and has 1 sibling.  The patient currently lives with his wife, son and baby.  They have been together for almost 3 years.  The patient was married on 02/2016.  The patient was previously married for 6 years.  Current Employment: The patient is currently disabled.  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  He has been at [employer] for 1-1/2 years and was at [employer] for 13 years.  Substance Use:  No concerns of substance abuse are reported.    Education:   HS Graduate  Medical History:   Past Medical History:  Diagnosis Date  . Asthma    MILD AS A CHILD  . Deviated septum   . Dyspnea    NORMAL SPIROMETRY 02/01/17 VISIT WITH DR HEDRICK  . Hypertension   . Sleep apnea   . Stroke [facility])          Psychiatric History:  There is no prior psychiatric history.  Family Med/Psych History:  Family History  Problem Relation Age of Onset  . Hypertension Father   . Hypertension Brother     Risk of Suicide/Violence: virtually non-existent  the patient denies any suicidal or homicidal ideation.  Impression/DX:  Jeremy [name] is a 43 year old male referred by Dr. Wynn BankerKirsteins for neuropsychological consultation.  The patient was admitted to the hospital after he presented on 07/27/2006 with right-sided weakness, headache, facial droop and slurred speech.  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  There was also cerebral edema with midline shift visualized.  A decompressive hemicraniotomy was performed.  The patient had significant aphasia/expressive language symptoms and near complete right arm and right leg motor impairments.  There was a great deal of impulse control that was almost completely verbal in nature initially.   The patient was initially verbally resistant and negative interactions during some therapy sessions were noted.  Receptive language functions did appear to be intact.  There was apparent significant depression and anxiety symptoms due to sudden loss of function.  The patient was treated in the comprehensive inpatient rehabilitation program.  During that time I had the opportunity to work with the patient's 3 times that this is a follow-up from that 2019.  The patient was referred for psychotherapeutic/neuropsychological consultation in consulting.  The patient is described as continuing to have significant anxiety and have some significant lack of motivation and desire.  The patient's family deny that it appears to be significant depression but simply no desire internal self initiation.  The patient is described as being completely fine sitting on the sofa and letting his wife do everything.  The patient continues to be rather resistant and denying of some of the level of deficits that he has.  He continues to have significant motor deficits in his right arm but less so in his right leg but also has some paralysis in his right leg.  The patient is described as having significant executive functioning deficits.  The patient was treated through [facility] system starting July 27, 2017 through September 07, 2017.  The patient initially started inpatient rehab while in the hospital and continues with in-home rehab then graduating to outpatient rehab.  The patient is described as sleeping fine and having good appetite.  There are some memory deficits noted primarily as a delay with short-term memory recall.  The patient's wife is his primary caretaker.  Current Status:  The patient is described as having significant self initiation deficits along with paralysis on the right side of his body.  The patient is rather resistant to any efforts to try to get him to engage or do things although he is assisting on  going back to work even though he has a significant deficits.  The patient has significant neuropsychological and cognitive deficits that continue.  The patient has significant executive functioning deficits along with his severe motor deficits.  The patient has almost no self initiation of behaviors unless he is very agitated emotionally.  The patient is currently disabled and would not be able to perform his ongoing job duties.   Disposition/Plan:  We have set the patient up for therapeutic interventions to build coping skills and strategies and to try to work on some of the residual cognitive and behavioral deficits.  These deficits are consistent with left frontal lobe dysfunction.  Diagnosis:    Spastic hemiparesis of right dominant side as late effect of cerebral infarction (HCC)  Frontal lobe and executive function deficit following cerebral infarction  Adjustment disorder, unspecified type         Electronically Signed   _______________________ Arley PhenixJohn Verlie Hellenbrand, Psy.D.

## 2018-11-18 NOTE — Telephone Encounter (Signed)
Hopefully within the next month, offices will start to see more patients with face-to-face visits where he can be evaluated by vascular surgery.  At this time, he can wait until this time and does not need a urgent referral.

## 2018-11-18 NOTE — Telephone Encounter (Signed)
I called pts wife that per Shanda Bumps NP the referral is not urgent it can wait until the vascular surgery is seeing routine pts. I advise to call the vascular every two weeks to see if they are seeing routine visits video or in office based on covid 19. The wife stated should he continue on eliquis. I stated per the telephone call 10/21/2018 its recommend per JEssica NP to continue eliquis until vascular evaluates him. The wife verbalized understanding.

## 2018-12-12 ENCOUNTER — Encounter: Payer: BLUE CROSS/BLUE SHIELD | Attending: Physical Medicine & Rehabilitation | Admitting: Psychology

## 2018-12-12 ENCOUNTER — Other Ambulatory Visit: Payer: Self-pay

## 2018-12-12 DIAGNOSIS — I69351 Hemiplegia and hemiparesis following cerebral infarction affecting right dominant side: Secondary | ICD-10-CM

## 2018-12-12 DIAGNOSIS — G4730 Sleep apnea, unspecified: Secondary | ICD-10-CM | POA: Diagnosis not present

## 2018-12-12 DIAGNOSIS — R4701 Aphasia: Secondary | ICD-10-CM

## 2018-12-12 DIAGNOSIS — I10 Essential (primary) hypertension: Secondary | ICD-10-CM | POA: Diagnosis not present

## 2018-12-12 DIAGNOSIS — Z8249 Family history of ischemic heart disease and other diseases of the circulatory system: Secondary | ICD-10-CM | POA: Insufficient documentation

## 2018-12-12 DIAGNOSIS — F4323 Adjustment disorder with mixed anxiety and depressed mood: Secondary | ICD-10-CM | POA: Diagnosis not present

## 2018-12-12 DIAGNOSIS — F4320 Adjustment disorder, unspecified: Secondary | ICD-10-CM

## 2018-12-12 DIAGNOSIS — I69314 Frontal lobe and executive function deficit following cerebral infarction: Secondary | ICD-10-CM

## 2018-12-12 DIAGNOSIS — I639 Cerebral infarction, unspecified: Secondary | ICD-10-CM

## 2018-12-12 DIAGNOSIS — I69319 Unspecified symptoms and signs involving cognitive functions following cerebral infarction: Secondary | ICD-10-CM | POA: Insufficient documentation

## 2018-12-19 ENCOUNTER — Encounter: Payer: Self-pay | Admitting: Psychology

## 2018-12-19 NOTE — Progress Notes (Signed)
Neuropsychology Visit  Patient:  Jeremy [name]   DOB: 1975-10-18  MR Number: [id number]  Location: [facility] FOR PAIN AND REHABILITATIVE MEDICINE [facility] PHYSICAL MEDICINE AND REHABILITATION [address] [place], [id number] [facility] [place] [address] Dept: [phone]  Date of Service: 12/12/2018  Start: 3 PM End: 4 PM  Duration of Service: 1 Hour  Today's visit was in person visit primarily with the patient and myself at the latter half of the visit also included the patient's wife.  Provider/Observer:     Hershal Coria PsyD  Chief Complaint:      Chief Complaint  Patient presents with  . Other    Partial paralysis of right side and cognitive deficits    Reason For Service:     Syan Krass is a 43 year old male referred by Dr. Wynn Banker for neuropsychological consultation.  The patient was admitted to the hospital after he presented on 07/27/2006 with right-sided weakness, headache, facial droop and slurred speech.  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  There was also cerebral edema with midline shift visualized.  A decompressive hemicraniotomy was performed.  The patient had significant aphasia/expressive language symptoms and near complete right arm and right leg motor impairments.  There was a great deal of impulse control that was almost completely verbal in nature initially.  The patient was initially verbally resistant and negative interactions during some therapy sessions were noted.  Receptive language functions did appear to be intact.  There was apparent significant depression and anxiety symptoms due to sudden loss of function.  The patient was treated in the comprehensive inpatient rehabilitation program.  During that time I had the opportunity to work with the patient's 3 times that this is a follow-up from that 2019.    The reason for service has been reviewed for this appointment remains applicable  for the current visit.  The patient continues to have significant motivation issues and initiation issues as well as reduced expressive language functioning.  The patient's wife reports that he has been doing much more around the house recently and is even been doing activities to help out around the house when not directly initiated or pushed by her or other family members.  Treatment Interventions:  Cognitive/behavioral therapeutic intervention adjustment issues as well as working on Pharmacologist and strategies for dealing with residual frontal lobe cognitive deficits  Participation Level:   Active  Participation Quality:  Appropriate and Redirectable      Behavioral Observation:  Well Groomed, Lethargic, and Appropriate.   Current Psychosocial Factors: The patient reports that he is still working on trying to develop some internal drive and initiative.  The patient reports that he has been doing more around the house hopeful improvement.  Content of Session:    Current symptoms and continue to work on therapeutic interventions.  Effectiveness of Interventions: The patient was more engaging and involved today that he has been in the past.  While it was difficult to get extensive responses from him he did initiate several questions today and this was the first time that he actively initiated responses versus being asked questions and him given simple one-word answers.  Target Goals:   Target goals include improving coping with his significant loss of function and working on executive functioning's.  Goals Last Reviewed:   12/12/2018  Goals Addressed Today:    Goals today include working on therapeutic interventions for self initiation of behaviors.  Impression/Diagnosis:  Jeremy [name] is a 43 year old male referred by Dr. Wynn BankerKirsteins for neuropsychological consultation.  The patient was admitted to the hospital after he presented on 07/27/2006 with right-sided weakness, headache, facial droop and  slurred speech.  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  There was also cerebral edema with midline shift visualized.  A decompressive hemicraniotomy was performed.  The patient had significant aphasia/expressive language symptoms and near complete right arm and right leg motor impairments.  There was a great deal of impulse control that was almost completely verbal in nature initially.  The patient was initially verbally resistant and negative interactions during some therapy sessions were noted.  Receptive language functions did appear to be intact.  There was apparent significant depression and anxiety symptoms due to sudden loss of function.  The patient was treated in the comprehensive inpatient rehabilitation program.  During that time I had the opportunity to work with the patient's 3 times that this is a follow-up from that 2019.  The patient was referred for psychotherapeutic/neuropsychological consultation in consulting.  The patient is described as continuing to have significant anxiety and have some significant lack of motivation and desire.  The patient's family deny that it appears to be significant depression but simply no desire internal self initiation.  The patient is described as being completely fine sitting on the sofa and letting his wife do everything.  The patient continues to be rather resistant and denying of some of the level of deficits that he has.  He continues to have significant motor deficits in his right arm but less so in his right leg but also has some paralysis in his right leg.  The patient is described as having significant executive functioning deficits.  The patient was treated through [facility] system starting July 27, 2017 through September 07, 2017.  The patient initially started inpatient rehab while in the hospital and continues with in-home rehab then graduating to outpatient rehab.  The patient is  described as sleeping fine and having good appetite.  There are some memory deficits noted primarily as a delay with short-term memory recall.  The patient's wife is his primary caretaker.   The patient is described as having significant self initiation deficits along with paralysis on the right side of his body.  The patient is rather resistant to any efforts to try to get him to engage or do things although he is assisting on going back to work even though he has a significant deficits.  The patient has significant neuropsychological and cognitive deficits that continue.  The patient has significant executive functioning deficits along with his severe motor deficits.  The patient has almost no self initiation of behaviors unless he is very agitated emotionally.  The patient is currently disabled and would not be able to perform his ongoing job duties.    Diagnosis:   Spastic hemiparesis of right dominant side as late effect of cerebral infarction (HCC)  Frontal lobe and executive function deficit following cerebral infarction  Adjustment disorder, unspecified type  Adjustment disorder with mixed anxiety and depressed mood  Aphasia due to acute stroke [facility](HCC)    Arley PhenixJohn Leila Schuff, Psy.D. Clinical Psychologist Neuropsychologist

## 2019-01-06 ENCOUNTER — Other Ambulatory Visit: Payer: Self-pay

## 2019-01-06 ENCOUNTER — Encounter: Payer: Self-pay | Admitting: Surgery

## 2019-01-06 ENCOUNTER — Other Ambulatory Visit: Payer: Self-pay | Admitting: *Deleted

## 2019-01-06 ENCOUNTER — Ambulatory Visit (INDEPENDENT_AMBULATORY_CARE_PROVIDER_SITE_OTHER): Payer: Self-pay | Admitting: Surgery

## 2019-01-06 VITALS — BP 155/103 | HR 83 | Temp 98.3°F | Resp 20 | Ht 71.0 in | Wt 206.0 lb

## 2019-01-06 DIAGNOSIS — O2230 Deep phlebothrombosis in pregnancy, unspecified trimester: Secondary | ICD-10-CM

## 2019-01-06 DIAGNOSIS — I82419 Acute embolism and thrombosis of unspecified femoral vein: Secondary | ICD-10-CM | POA: Insufficient documentation

## 2019-01-06 DIAGNOSIS — I82412 Acute embolism and thrombosis of left femoral vein: Secondary | ICD-10-CM

## 2019-01-06 DIAGNOSIS — D6859 Other primary thrombophilia: Secondary | ICD-10-CM

## 2019-01-06 NOTE — Progress Notes (Signed)
[facility]  Patient name: Jeremy [name] MRN: [id number] DOB: 06-16-1976 Sex: male   REQUESTING PROVIDER:    George HughJessica Vanschaick   REASON FOR CONSULT:    DVT  HISTORY OF PRESENT ILLNESS:   Jeremy [name] is a 43 y.o. male, who is referred today for evaluation of a left leg DVT.  This was suffered back in March 2019.  He had been dealing with an acute stroke and prolonged immobility.  Currently, he denies any pain or swelling in the leg.  The patient has a history of a large left middle cerebral artery infarct with cytotoxic edema of unknown etiology.  He did undergo hemicraniectomy.  He is medically managed for hypertension.  He is a non-smoker.  He suffers from sleep apnea  PAST MEDICAL HISTORY    Past Medical History:  Diagnosis Date  . Asthma    MILD AS A CHILD  . Deviated septum   . Dyspnea    NORMAL SPIROMETRY 02/01/17 VISIT WITH DR HEDRICK  . Hypertension   . Sleep apnea   . Stroke [facility](HCC)      FAMILY HISTORY   Family History  Problem Relation Age of Onset  . Hypertension Father   . Hypertension Brother     SOCIAL HISTORY:   Social History   Socioeconomic History  . Marital status: Married    Spouse name: Not on file  . Number of children: Not on file  . Years of education: Not on file  . Highest education level: Not on file  Occupational History  . Not on file  Social Needs  . Financial resource strain: Not on file  . Food insecurity    Worry: Not on file    Inability: Not on file  . Transportation needs    Medical: Not on file    Non-medical: Not on file  Tobacco Use  . Smoking status: Never Smoker  . Smokeless tobacco: Never Used  Substance and Sexual Activity  . Alcohol use: Not Currently    Comment: RARE  . Drug use: No  . Sexual activity: Not on file  Lifestyle  . Physical activity    Days per week: Not on file    Minutes per session: Not on file  . Stress: Not on file   Relationships  . Social Musicianconnections    Talks on phone: Not on file    Gets together: Not on file    Attends religious service: Not on file    Active member of club or organization: Not on file    Attends meetings of clubs or organizations: Not on file    Relationship status: Not on file  . Intimate partner violence    Fear of current or ex partner: Not on file    Emotionally abused: Not on file    Physically abused: Not on file    Forced sexual activity: Not on file  Other Topics Concern  . Not on file  Social History Narrative  . Not on file    ALLERGIES:    Allergies  Allergen Reactions  . Blood-Group Specific Substance Other (See Comments)    PT [facility] DOES NOT ACCEPT BLOOD PRODUCTS  . Lipitor [Atorvastatin Calcium] Other (See Comments)    myalgia    CURRENT MEDICATIONS:    Current Outpatient Medications  Medication Sig Dispense Refill  . amLODipine (NORVASC) 5 MG tablet Take 5 mg by mouth daily.     Marland Kitchen. aspirin EC 81 MG tablet  Take 1 tablet (81 mg total) by mouth daily. 30 tablet 0  . ELIQUIS 5 MG TABS tablet Take 1 tablet by mouth 2 (two) times daily.  10  . ezetimibe (ZETIA) 10 MG tablet Take 10 mg by mouth daily.     Marland Kitchen. gabapentin (NEURONTIN) 400 MG capsule TAKE ONE CAPSULE BY MOUTH THREE TIMES A DAY 90 capsule 1  . loratadine (CLARITIN) 10 MG tablet Take 10 mg by mouth daily.    . rosuvastatin (CRESTOR) 10 MG tablet Take 10 mg by mouth daily.    Marland Kitchen. spironolactone (ALDACTONE) 50 MG tablet Take 50 mg by mouth daily.     No current facility-administered medications for this visit.     REVIEW OF SYSTEMS:   [X]  denotes positive finding, [ ]  denotes negative finding Cardiac  Comments:  Chest pain or chest pressure:    Shortness of breath upon exertion:    Short of breath when lying flat:    Irregular heart rhythm:        Vascular    Pain in calf, thigh, or hip brought on by ambulation:    Pain in feet at night that wakes you up from your sleep:      Blood clot in your veins: x   Leg swelling:         Pulmonary    Oxygen at home:    Productive cough:     Wheezing:         Neurologic    Sudden weakness in arms or legs:     Sudden numbness in arms or legs:     Sudden onset of difficulty speaking or slurred speech:    Temporary loss of vision in one eye:     Problems with dizziness:         Gastrointestinal    Blood in stool:      Vomited blood:         Genitourinary    Burning when urinating:     Blood in urine:        Psychiatric    Major depression:         Hematologic    Bleeding problems:    Problems with blood clotting too easily:        Skin    Rashes or ulcers:        Constitutional    Fever or chills:     PHYSICAL EXAM:   Vitals:   01/06/19 1021  BP: (!) 155/103  Pulse: 83  Resp: 20  Temp: 98.3 F (36.8 C)  SpO2: 98%  Weight: 93.4 kg  Height: 5\' 11"  (1.803 m)    GENERAL: The patient is a well-nourished male, in no acute distress. The vital signs are documented above. CARDIAC: There is a regular rate and rhythm.  VASCULAR: No significant left or right leg edema PULMONARY: Nonlabored respirations ABDOMEN: Soft and non-tender with normal pitched bowel sounds.  MUSCULOSKELETAL: Right leg brace  nEUROLOGIC: No focal weakness or paresthesias are detected. SKIN: There are no ulcers or rashes noted. PSYCHIATRIC: The patient has a normal affect.  STUDIES:   I have reviewed the following:   Left: Findings consistent with chronic deep vein thrombosis involving the left common femoral vein, left femoral vein, and left popliteal vein. The profunda vein is patent. There is spontaneous flow in the IVC and right common iliac vein, external iliac vein. The left common iliac vein appears patent proximally with chronic occlusion distally and in the external iliac vein.  ASSESSMENT and PLAN   Left leg DVT: The patient has no residual symptoms from his iliofemoral DVT.  He has been on Eliquis since March 2019  for this.  Typically, he would receive anticoagulation for 3-6 months.  Before stopping this however given his history of cryptogenic stroke, I wonder if he has some underlying hypercoagulable issues which may predispose him to future events.  Before stopping his Eliquis, I would like to have him evaluated by hematology.  If hematology feels he can come off of the Eliquis, I would support that.   Leia Alf, MD, FACS [facility] Specialists of [facility] [phone] Pager [phone]

## 2019-01-30 ENCOUNTER — Encounter: Payer: BLUE CROSS/BLUE SHIELD | Attending: Physical Medicine & Rehabilitation | Admitting: Psychology

## 2019-01-30 ENCOUNTER — Other Ambulatory Visit: Payer: Self-pay

## 2019-01-30 ENCOUNTER — Encounter

## 2019-01-30 DIAGNOSIS — R4701 Aphasia: Secondary | ICD-10-CM

## 2019-01-30 DIAGNOSIS — I639 Cerebral infarction, unspecified: Secondary | ICD-10-CM | POA: Diagnosis not present

## 2019-01-30 DIAGNOSIS — I69351 Hemiplegia and hemiparesis following cerebral infarction affecting right dominant side: Secondary | ICD-10-CM | POA: Insufficient documentation

## 2019-01-30 DIAGNOSIS — Z8249 Family history of ischemic heart disease and other diseases of the circulatory system: Secondary | ICD-10-CM | POA: Diagnosis not present

## 2019-01-30 DIAGNOSIS — G4730 Sleep apnea, unspecified: Secondary | ICD-10-CM | POA: Insufficient documentation

## 2019-01-30 DIAGNOSIS — I69319 Unspecified symptoms and signs involving cognitive functions following cerebral infarction: Secondary | ICD-10-CM | POA: Insufficient documentation

## 2019-01-30 DIAGNOSIS — I10 Essential (primary) hypertension: Secondary | ICD-10-CM | POA: Insufficient documentation

## 2019-01-30 DIAGNOSIS — I69314 Frontal lobe and executive function deficit following cerebral infarction: Secondary | ICD-10-CM

## 2019-01-30 DIAGNOSIS — F4323 Adjustment disorder with mixed anxiety and depressed mood: Secondary | ICD-10-CM | POA: Diagnosis not present

## 2019-01-31 ENCOUNTER — Other Ambulatory Visit: Payer: Self-pay | Admitting: *Deleted

## 2019-01-31 DIAGNOSIS — D6859 Other primary thrombophilia: Secondary | ICD-10-CM

## 2019-02-02 ENCOUNTER — Encounter: Payer: Self-pay | Admitting: Psychology

## 2019-02-02 NOTE — Progress Notes (Signed)
Neuropsychology Visit  Patient:  Jeremy [name]   DOB: 08/12/1975  MR Number: [id number]  [facility] [address] [id number] [facility] [place] [id number] Dept: [phone]  Date of Service: 12/12/2018  Start: 3 PM End: 4 PM  Duration of Service: 1 Hour  Today's visit was in person visit primarily with the patient and myself at the latter half of the visit also included the patient's wife.  Provider/Observer:     Hershal CoriaJohn R Rodenbough PsyD  Chief Complaint:      Chief Complaint  Patient presents with  . Agitation  . Memory Loss  . Other    Expressive aphasia  . Cerebrovascular Accident    Reason For Service:     Jeremy [name] is a 43 year old male referred by Dr. Wynn BankerKirsteins for neuropsychological consultation.  The patient was admitted to the hospital after he presented on 07/27/2006 with right-sided weakness, headache, facial droop and slurred speech.  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  There was also cerebral edema with midline shift visualized.  A decompressive hemicraniotomy was performed.  The patient had significant aphasia/expressive language symptoms and near complete right arm and right leg motor impairments.  There was a great deal of impulse control that was almost completely verbal in nature initially.  The patient was initially verbally resistant and negative interactions during some therapy sessions were noted.  Receptive language functions did appear to be intact.  There was apparent significant depression and anxiety symptoms due to sudden loss of function.  The patient was treated in the comprehensive inpatient rehabilitation program.  During that time I had the opportunity to work with the patient's 3 times that this is a follow-up from that 2019.    The reason for service has been reviewed for this  appointment remains applicable for the current visit.  The patient continues to have significant motivation issues and initiation issues as well as reduced expressive language functioning.  The patient's wife reports that he has been doing much more around the house recently and is even been doing activities to help out around the house when not directly initiated or pushed by her or other family members.  Treatment Interventions:  Cognitive/behavioral therapeutic intervention adjustment issues as well as working on Pharmacologistcoping skills and strategies for dealing with residual frontal lobe cognitive deficits  Participation Level:   Active  Participation Quality:  Appropriate and Redirectable      Behavioral Observation:  Well Groomed, Lethargic, and Appropriate.   Current Psychosocial Factors: The patient reports that he is still working on trying to develop some internal drive and initiative.  The patient reports that he has been doing more around the house hopeful improvement.  Content of Session:    Current symptoms and continue to work on therapeutic interventions.  Effectiveness of Interventions: The patient was more engaging and involved today that he has been in the past.  While it was difficult to get extensive responses from him he did initiate several questions today and this was the first time that he actively initiated responses versus being asked questions and him given simple one-word answers.  Target Goals:   Target goals include improving coping with his significant loss of function and working on executive functioning's.  Goals Last Reviewed:   01/30/2019  Goals Addressed Today:    Goals today include working on therapeutic interventions for self initiation  of behaviors.  Impression/Diagnosis:   Jeremy [name] is a 43 year old male referred by Dr. Letta Pate for neuropsychological consultation.  The patient was admitted to the hospital after he presented on 07/27/2006 with right-sided  weakness, headache, facial droop and slurred speech.  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  There was also cerebral edema with midline shift visualized.  A decompressive hemicraniotomy was performed.  The patient had significant aphasia/expressive language symptoms and near complete right arm and right leg motor impairments.  There was a great deal of impulse control that was almost completely verbal in nature initially.  The patient was initially verbally resistant and negative interactions during some therapy sessions were noted.  Receptive language functions did appear to be intact.  There was apparent significant depression and anxiety symptoms due to sudden loss of function.  The patient was treated in the comprehensive inpatient rehabilitation program.  During that time I had the opportunity to work with the patient's 3 times that this is a follow-up from that 2019.  The patient was referred for psychotherapeutic/neuropsychological consultation in consulting.  The patient is described as continuing to have significant anxiety and have some significant lack of motivation and desire.  The patient's family deny that it appears to be significant depression but simply no desire internal self initiation.  The patient is described as being completely fine sitting on the sofa and letting his wife do everything.  The patient continues to be rather resistant and denying of some of the level of deficits that he has.  He continues to have significant motor deficits in his right arm but less so in his right leg but also has some paralysis in his right leg.  The patient is described as having significant executive functioning deficits.  The patient was treated through [facility] system starting July 27, 2017 through September 07, 2017.  The patient initially started inpatient rehab while in the hospital and continues with in-home rehab then graduating to  outpatient rehab.  The patient is described as sleeping fine and having good appetite.  There are some memory deficits noted primarily as a delay with short-term memory recall.  The patient's wife is his primary caretaker.   The patient is described as having significant self initiation deficits along with paralysis on the right side of his body.  The patient is rather resistant to any efforts to try to get him to engage or do things although he is assisting on going back to work even though he has a significant deficits.  The patient has significant neuropsychological and cognitive deficits that continue.  The patient has significant executive functioning deficits along with his severe motor deficits.  The patient has almost no self initiation of behaviors unless he is very agitated emotionally.  The patient is currently disabled and would not be able to perform his ongoing job duties.     Ilean Skill, Psy.D. Clinical Psychologist Neuropsychologist

## 2019-02-13 ENCOUNTER — Encounter: Payer: BLUE CROSS/BLUE SHIELD | Admitting: Psychology

## 2019-02-18 ENCOUNTER — Ambulatory Visit: Payer: Self-pay | Admitting: Psychology

## 2019-02-27 ENCOUNTER — Encounter: Payer: BLUE CROSS/BLUE SHIELD | Attending: Physical Medicine & Rehabilitation | Admitting: Psychology

## 2019-02-27 ENCOUNTER — Other Ambulatory Visit: Payer: Self-pay

## 2019-02-27 DIAGNOSIS — I69319 Unspecified symptoms and signs involving cognitive functions following cerebral infarction: Secondary | ICD-10-CM | POA: Diagnosis not present

## 2019-02-27 DIAGNOSIS — G4730 Sleep apnea, unspecified: Secondary | ICD-10-CM | POA: Diagnosis not present

## 2019-02-27 DIAGNOSIS — F4323 Adjustment disorder with mixed anxiety and depressed mood: Secondary | ICD-10-CM

## 2019-02-27 DIAGNOSIS — I69314 Frontal lobe and executive function deficit following cerebral infarction: Secondary | ICD-10-CM | POA: Diagnosis not present

## 2019-02-27 DIAGNOSIS — R4701 Aphasia: Secondary | ICD-10-CM

## 2019-02-27 DIAGNOSIS — I10 Essential (primary) hypertension: Secondary | ICD-10-CM | POA: Diagnosis not present

## 2019-02-27 DIAGNOSIS — I69351 Hemiplegia and hemiparesis following cerebral infarction affecting right dominant side: Secondary | ICD-10-CM | POA: Insufficient documentation

## 2019-02-27 DIAGNOSIS — Z8249 Family history of ischemic heart disease and other diseases of the circulatory system: Secondary | ICD-10-CM | POA: Insufficient documentation

## 2019-02-27 DIAGNOSIS — I639 Cerebral infarction, unspecified: Secondary | ICD-10-CM

## 2019-03-04 ENCOUNTER — Encounter: Payer: Self-pay | Admitting: Psychology

## 2019-03-04 NOTE — Progress Notes (Signed)
Neuropsychology Visit  Patient:  Jeremy [name]   DOB: 1975/11/18  MR Number: [id number]  Location: [facility] [place] [place] [id number] [place] [address] Dept: [phone]  Date of Service: 01/26/2021    Start: 3 PM End: 4 PM  Duration of Service: 1 Hour Today's visit was an in person visit that was conducted with the patient myself present in my outpatient clinic office.  Provider/Observer:     Edgardo Roys PsyD  Chief Complaint:      Chief Complaint  Patient presents with  . Agitation  . Depression  . Other    Loss of motivation due to cerebrovascular accident/event    Reason For Service:     Jeremy [name] is a 43 year old male referred by Dr. Letta Pate for neuropsychological consultation.  The patient was admitted to the hospital after he presented on 07/27/2006 with right-sided weakness, headache, facial droop and slurred speech.  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  There was also cerebral edema with midline shift visualized.  A decompressive hemicraniotomy was performed.  The patient had significant aphasia/expressive language symptoms and near complete right arm and right leg motor impairments.  There was a great deal of impulse control that was almost completely verbal in nature initially.  The patient was initially verbally resistant and negative interactions during some therapy sessions were noted.  Receptive language functions did appear to be intact.  There was apparent significant depression and anxiety symptoms due to sudden loss of function.  The patient was treated in the comprehensive inpatient rehabilitation program.  During that time I had the opportunity to work with the patient's 3 times that this is a follow-up from that 2019.    The reason for service has been reviewed for this appointment  remains applicable for the current visit.  The patient continues to have significant motivation issues and initiation issues as well as reduced expressive language functioning.  The patient is continuing to struggle to do significant activities around the house and motivate himself to do things when others are not directing this activity..  Treatment Interventions:  Cognitive/behavioral therapeutic intervention adjustment issues as well as working on coping skills and strategies for dealing with residual frontal lobe cognitive deficits  Participation Level:   Active  Participation Quality:  Appropriate and Redirectable      Behavioral Observation:  Well Groomed, Lethargic, and Appropriate.   Current Psychosocial Factors: The patient reports that he is still working on trying to develop some internal drive and initiative.  The patient reports that he has been doing more around the house hopeful improvement.  Content of Session:    Current symptoms and continue to work on therapeutic interventions.  Effectiveness of Interventions: The patient was more engaging and involved today that he has been in the past.  While it was difficult to get extensive responses from him he did initiate several questions today and this was the first time that he actively initiated responses versus being asked questions and him given simple one-word answers.  Target Goals:   Target goals include improving coping with his significant loss of function and working on executive functioning's.  Goals Last Reviewed:   02/27/2019  Goals Addressed Today:    Goals today include working on therapeutic interventions for self initiation of behaviors.  Impression/Diagnosis:   Jeremy [name] is a 43 year old male referred by Dr. Letta Pate  for neuropsychological consultation.  The patient was admitted to the hospital after he presented on 07/27/2006 with right-sided weakness, headache, facial droop and slurred speech.  It was determined  that the patient had M1 occlusion with intermediate left MCA infarction.  There was also cerebral edema with midline shift visualized.  A decompressive hemicraniotomy was performed.  The patient had significant aphasia/expressive language symptoms and near complete right arm and right leg motor impairments.  There was a great deal of impulse control that was almost completely verbal in nature initially.  The patient was initially verbally resistant and negative interactions during some therapy sessions were noted.  Receptive language functions did appear to be intact.  There was apparent significant depression and anxiety symptoms due to sudden loss of function.  The patient was treated in the comprehensive inpatient rehabilitation program.  During that time I had the opportunity to work with the patient's 3 times that this is a follow-up from that 2019.  The patient was referred for psychotherapeutic/neuropsychological consultation in consulting.  The patient is described as continuing to have significant anxiety and have some significant lack of motivation and desire.  The patient's family deny that it appears to be significant depression but simply no desire internal self initiation.  The patient is described as being completely fine sitting on the sofa and letting his wife do everything.  The patient continues to be rather resistant and denying of some of the level of deficits that he has.  He continues to have significant motor deficits in his right arm but less so in his right leg but also has some paralysis in his right leg.  The patient is described as having significant executive functioning deficits.  The [facility] July 27, 2017 through September 07, 2017.  The patient initially started inpatient rehab while in the hospital and continues with in-home rehab then graduating to outpatient rehab.  The patient is described as sleeping fine and having good  appetite.  There are some memory deficits noted primarily as a delay with short-term memory recall.  The patient's wife is his primary caretaker.   The patient is described as having significant self initiation deficits along with paralysis on the right side of his body.  The patient is rather resistant to any efforts to try to get him to engage or do things although he is assisting on going back to work even though he has a significant deficits.  The patient has significant neuropsychological and cognitive deficits that continue.  The patient has significant executive functioning deficits along with his severe motor deficits.  The patient has almost no self initiation of behaviors unless he is very agitated emotionally.  The patient is currently disabled and would not be able to perform his ongoing job duties.     Arley PhenixJohn Rodenbough, Psy.D. Clinical Psychologist Neuropsychologist

## 2019-03-13 ENCOUNTER — Encounter: Payer: Self-pay | Admitting: Psychology

## 2019-03-13 ENCOUNTER — Other Ambulatory Visit: Payer: Self-pay

## 2019-03-13 ENCOUNTER — Encounter: Payer: BLUE CROSS/BLUE SHIELD | Attending: Psychology | Admitting: Psychology

## 2019-03-13 DIAGNOSIS — F4323 Adjustment disorder with mixed anxiety and depressed mood: Secondary | ICD-10-CM | POA: Diagnosis not present

## 2019-03-13 DIAGNOSIS — I69351 Hemiplegia and hemiparesis following cerebral infarction affecting right dominant side: Secondary | ICD-10-CM | POA: Diagnosis not present

## 2019-03-13 DIAGNOSIS — I69314 Frontal lobe and executive function deficit following cerebral infarction: Secondary | ICD-10-CM

## 2019-03-13 DIAGNOSIS — R4701 Aphasia: Secondary | ICD-10-CM | POA: Diagnosis present

## 2019-03-13 DIAGNOSIS — I639 Cerebral infarction, unspecified: Secondary | ICD-10-CM | POA: Diagnosis present

## 2019-03-13 NOTE — Progress Notes (Signed)
Neuropsychology Visit  Patient:  Jeremy [name]   DOB: [birth year]  MR Number: [id number]  [facility] [address] [id number] [facility] [place] [id number] Dept: [id number]  Date of Service: 03/13/2019    Start: 1 PM End: 2 PM  Duration of Service: 1 Hour Today's visit was an in person visit that was conducted with the patient his wife and myself in my outpatient clinic office.  Provider/Observer:     Hershal CoriaJohn R Zade Falkner PsyD  Chief Complaint:      Chief Complaint  Patient presents with  . Agitation  . Depression    Reason For Service:     Jeremy GashJao Johal is a 43 year old male referred by Dr. Wynn BankerKirsteins for neuropsychological consultation.  The patient was admitted to the hospital after he presented on 07/27/2006 with right-sided weakness, headache, facial droop and slurred speech.  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  There was also cerebral edema with midline shift visualized.  A decompressive hemicraniotomy was performed.  The patient had significant aphasia/expressive language symptoms and near complete right arm and right leg motor impairments.  There was a great deal of impulse control that was almost completely verbal in nature initially.  The patient was initially verbally resistant and negative interactions during some therapy sessions were noted.  Receptive language functions did appear to be intact.  There was apparent significant depression and anxiety symptoms due to sudden loss of function.  The patient was treated in the comprehensive inpatient rehabilitation program.  During that time I had the opportunity to work with the patient's 3 times that this is a follow-up from that 2019.    The above reason for service has been reviewed for this appointment remains applicable for the current visit.  The patient continues  to have significant motivation issues but they are improving overall.  The patient's wife and the patient both report that he has been doing more activities around the house but it is still difficult for them to connect and I personally with the changes that have happened with the husband passed his cerebrovascular accident.  Treatment Interventions:  Cognitive/behavioral therapeutic intervention adjustment issues as well as working on Pharmacologistcoping skills and strategies for dealing with residual frontal lobe cognitive deficits  Participation Level:   Active  Participation Quality:  Appropriate and Redirectable      Behavioral Observation:  Well Groomed, Alert, and Appropriate.   Current Psychosocial Factors: The patient's wife reports that she has seen some significant improvements in his degree of activity and engagement around the house.  There is still some issues related to how they are able to connect and relate interpersonally but he is doing much more around the house and is following some of the treatment interventions we have developed.  Content of Session:    Current symptoms and continue to work on therapeutic interventions.  Effectiveness of Interventions: The patient was more engaging and involved today that he has been in the past.  While it was difficult to get extensive responses from him he did initiate several questions today and this was the first time that he actively initiated responses versus being asked questions and him given simple one-word answers.  Target Goals:   Target goals include improving coping with his significant loss of function and working on executive functioning's.  Goals Last Reviewed:   03/13/2019  Goals Addressed Today:  Goals today include working on therapeutic interventions for self initiation of behaviors.  Impression/Diagnosis:   Jeremy [name] is a 43 year old male referred by Dr. Letta Pate for neuropsychological consultation.  The patient was admitted to  the hospital after he presented on 07/27/2006 with right-sided weakness, headache, facial droop and slurred speech.  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  There was also cerebral edema with midline shift visualized.  A decompressive hemicraniotomy was performed.  The patient had significant aphasia/expressive language symptoms and near complete right arm and right leg motor impairments.  There was a great deal of impulse control that was almost completely verbal in nature initially.  The patient was initially verbally resistant and negative interactions during some therapy sessions were noted.  Receptive language functions did appear to be intact.  There was apparent significant depression and anxiety symptoms due to sudden loss of function.  The patient was treated in the comprehensive inpatient rehabilitation program.  During that time I had the opportunity to work with the patient's 3 times that this is a follow-up from that 2019.  The patient was referred for psychotherapeutic/neuropsychological consultation in consulting.  The patient is described as continuing to have significant anxiety and have some significant lack of motivation and desire.  The patient's family deny that it appears to be significant depression but simply no desire internal self initiation.  The patient is described as being completely fine sitting on the sofa and letting his wife do everything.  The patient continues to be rather resistant and denying of some of the level of deficits that he has.  He continues to have significant motor deficits in his right arm but less so in his right leg but also has some paralysis in his right leg.  The patient is described as having significant executive functioning deficits.  The patient was treated through [facility] system starting July 27, 2017 through September 07, 2017.  The patient initially started inpatient rehab while in the  hospital and continues with in-home rehab then graduating to outpatient rehab.  The patient is described as sleeping fine and having good appetite.  There are some memory deficits noted primarily as a delay with short-term memory recall.  The patient's wife is his primary caretaker.   The patient is described as having significant self initiation deficits along with paralysis on the right side of his body.  The patient is rather resistant to any efforts to try to get him to engage or do things although he is assisting on going back to work even though he has a significant deficits.  The patient has significant neuropsychological and cognitive deficits that continue.  The patient has significant executive functioning deficits along with his severe motor deficits.  The patient has almost no self initiation of behaviors unless he is very agitated emotionally.  The patient is currently disabled and would not be able to perform his ongoing job duties.     Ilean Skill, Psy.D. Clinical Psychologist Neuropsychologist

## 2019-03-14 ENCOUNTER — Encounter: Payer: Self-pay | Admitting: Physical Medicine & Rehabilitation

## 2019-03-14 ENCOUNTER — Encounter: Payer: BLUE CROSS/BLUE SHIELD | Admitting: Physical Medicine & Rehabilitation

## 2019-03-14 VITALS — BP 205/139 | HR 110 | Temp 98.7°F | Ht 71.0 in | Wt 211.0 lb

## 2019-03-14 DIAGNOSIS — I69351 Hemiplegia and hemiparesis following cerebral infarction affecting right dominant side: Secondary | ICD-10-CM | POA: Diagnosis not present

## 2019-03-14 NOTE — Patient Instructions (Signed)
Please take blood pressure meds every morning  Will do repeat phenol injection Right leg next visit  After that will repeat botox Right hand finger flexors  Don't see any worrisome abnormalities in the Right knee or Left calf

## 2019-03-14 NOTE — Progress Notes (Signed)
Subjective:    Patient ID: Jeremy [name], male    DOB: [birth year], 43 y.o.   MRN: [id number]  HPI   43 year old male with history of hypertension, uncontrolled who suffered a large left MCA distribution infarct on July 27, 2017.  He has had craniotomy with replacement of bone flap.  He had a 1 month inpatient rehab stay between January and February 2019.  He has been going through outpatient therapy.  He has been receiving botulinum toxin right upper extremity right lower extremity for spasticity worked well for the upper extremity however lower extremity did not adequately control.  He has been using double metal upright AFO He needs some assistance donning and doffing his AFO.  He needs assistance with dressing and bathing His last therapy visits were in 2019. Pain Inventory Average Pain 0 Pain Right Now 0 My pain is na  In the last 24 hours, has pain interfered with the following? General activity 5 Relation with others 5 Enjoyment of life 5 What TIME of day is your pain at its worst? daytime Sleep (in general) Good  Pain is worse with: walking, bending and standing Pain improves with: therapy/exercise Relief from Meds: 1  Mobility walk without assistance use a cane how many minutes can you walk? 10 ability to climb steps?  yes do you drive?  no  Function disabled: date disabled 07/27/17  Neuro/Psych tremor spasms  Prior Studies Any changes since last visit?  no  Physicians involved in your care Any changes since last visit?  no   Family History  Problem Relation Age of Onset  . Hypertension Father   . Hypertension Brother    Social History   Socioeconomic History  . Marital status: Married    Spouse name: Not on file  . Number of children: Not on file  . Years of education: Not on file  . Highest education level: Not on file  Occupational History  . Not on file  Social Needs  . Financial resource strain: Not on file  . Food insecurity    Worry: Not  on file    Inability: Not on file  . Transportation needs    Medical: Not on file    Non-medical: Not on file  Tobacco Use  . Smoking status: Never Smoker  . Smokeless tobacco: Never Used  Substance and Sexual Activity  . Alcohol use: Not Currently    Comment: RARE  . Drug use: No  . Sexual activity: Not on file  Lifestyle  . Physical activity    Days per week: Not on file    Minutes per session: Not on file  . Stress: Not on file  Relationships  . Social Musicianconnections    Talks on phone: Not on file    Gets together: Not on file    Attends religious service: Not on file    Active member of club or organization: Not on file    Attends meetings of clubs or organizations: Not on file    Relationship status: Not on file  Other Topics Concern  . Not on file  Social History Narrative  . Not on file   Past Surgical History:  Procedure Laterality Date  . CRANIOPLASTY N/A 03/19/2018   Procedure: CRANIOPLASTY;  Surgeon: Lisbeth RenshawNundkumar, Neelesh, MD;  [facility];  Service: Neurosurgery;  Laterality: N/A;  CRANIOPLASTY  . CRANIOTOMY Left 07/29/2017   Procedure: LEFT HEMI- CRANIECTOMY WITH PLACEMENT OF BONE FLAP IN ABDOMEN;  Surgeon: Lisbeth RenshawNundkumar, Neelesh, MD;  Location:  [facility] OR;  Service: Neurosurgery;  Laterality: Left;  . IR ANGIO INTRA EXTRACRAN SEL COM CAROTID INNOMINATE UNI R MOD SED  07/28/2017  . IR ANGIO VERTEBRAL SEL VERTEBRAL UNI R MOD SED  07/28/2017  . IR CT HEAD LTD  07/28/2017  . IR PERCUTANEOUS ART THROMBECTOMY/INFUSION INTRACRANIAL INC DIAG ANGIO  07/28/2017  . NASAL SEPTOPLASTY W/ TURBINOPLASTY Bilateral 03/06/2017   Procedure: NASAL SEPTOPLASTY WITH TURBINATE REDUCTION;  Surgeon: Beverly Gust, MD;  Location: [facility] ORS;  Service: ENT;  Laterality: Bilateral;  . RADIOLOGY WITH ANESTHESIA N/A 07/27/2017   Procedure: RADIOLOGY WITH ANESTHESIA;  Surgeon: Luanne Bras, MD;  Location: [facility];  Service: Radiology;  Laterality: N/A;   Past Medical History:  Diagnosis Date  .  Asthma    MILD AS A CHILD  . Deviated septum   . Dyspnea    NORMAL SPIROMETRY 02/01/17 VISIT WITH DR [place]  . Hypertension   . Sleep apnea   . Stroke ([place])    BP (!) 205/139   Pulse (!) 110   Temp 98.7 F (37.1 C)   Ht 5\' 11"  (1.803 m)   Wt 211 lb (95.7 kg)   SpO2 95%   BMI 29.43 kg/m   Opioid Risk Score:   Fall Risk Score:  `1  Depression screen PHQ 2/9  Depression screen PHQ 2/9 09/24/2017  Decreased Interest 2  Down, Depressed, Hopeless 1  PHQ - 2 Score 3  Altered sleeping 0  Tired, decreased energy 2  Change in appetite 2  Feeling bad or failure about yourself  1  Trouble concentrating 0  Moving slowly or fidgety/restless 0  Suicidal thoughts 0  PHQ-9 Score 8  Difficult doing work/chores Somewhat difficult    Review of Systems  Constitutional: Negative.   HENT: Negative.   Eyes: Negative.   Respiratory: Negative.   Cardiovascular: Negative.   Gastrointestinal: Negative.   Endocrine: Negative.   Genitourinary: Negative.   Musculoskeletal: Positive for gait problem.  Skin: Negative.   Allergic/Immunologic: Negative.   Neurological: Positive for tremors.  Hematological: Negative.   Psychiatric/Behavioral: Negative.   All other systems reviewed and are negative.      Objective:   Physical Exam  Right upper extremity 0/5 strength Right upper extremity tone MAS 3 in the finger flexors and wrist flexors as well as thumb flexor.  MAS 1 in the elbow flexor. Right lower extremity 3- hip knee extensor synergy zero at ankle. Sensation absent in the right upper extremity present to light touch left lower extremity Tone has clonus at the right ankle.  Has equinovarus spasticity with standing right lower extremity.  Right knee with slight erythema no tenderness palpation no evidence of effusion no pain with range of motion Left calf patient states it feels tight no pain with ankle range of motion negative Thompson's test neurovascular intact.    Assessment &  Plan:  1.  Chronic right spastic hemiplegia.  He has had no neurologic recovery in the right upper extremity in terms of motor function.  He also has absent sensation.  Right lower extremity has regained proximal strength still has severe equina varus spasticity in the right foot and ankle. Will schedule for tibial nerve block with phenol The following month will repeat botulinum toxin injection to the finger wrist flexors.  He is at risk for developing contracture He may benefit from further therapy after injections

## 2019-03-31 ENCOUNTER — Telehealth: Payer: Self-pay | Admitting: Hematology and Oncology

## 2019-03-31 NOTE — Telephone Encounter (Signed)
Received a new patient referral from Dr. Trula Slade for [facility] for lft leg dvt. Mrs. Iodice retunred my call to schedule an appt for her husband to see Dr. Lindi Adie on 9/23 at 1pm. She's been made aware to arrive 15 minutes early.

## 2019-04-01 ENCOUNTER — Encounter: Payer: Self-pay | Admitting: *Deleted

## 2019-04-01 NOTE — Progress Notes (Signed)
Hypercoagulation panel lab results faxed to office of Nicholas Lose at [facility] center. Patient's wife notified.

## 2019-04-08 NOTE — Progress Notes (Signed)
[facility] CONSULT NOTE  Patient Care Team: [name], MD as PCP - General (Family Medicine)  CHIEF COMPLAINTS/PURPOSE OF CONSULTATION:  History of left leg DVT, PE and stroke  HISTORY OF PRESENTING ILLNESS:  Jeremy [name] 43 y.o. male is here because of a history of DVT of the left leg, PE and stroke. He is referred by [facility] Coral [facility]. He presented to the ED on 10/06/17 for lower left leg pain and was found by [place] to have a DVT. CT additionally showed a small right lung PE without right heart strain. He was started on heparin and switched to Eliquis. He also has a history of acute stroke, for which he underwent a hemicraniectomy. He is on 81mg  aspirin daily. He presents to the clinic today for initial evaluation and discussion of anticoagulation therapy.  Repeat testing with ultrasounds have revealed chronic DVTs and he has seen vascular surgery who performed extensive hypercoagulability work-up and was sent to for discussion regarding duration of anticoagulation.  I reviewed isr records extensively and collaborated the history with the patient.  MEDICAL HISTORY:  Past Medical History:  Diagnosis Date  . Asthma    MILD AS A CHILD  . Deviated septum   . Dyspnea    NORMAL SPIROMETRY 02/01/17 VISIT WITH DR HEDRICK  . Hypertension   . Sleep apnea   . Stroke [facility])     SURGICAL HISTORY: Past Surgical History:  Procedure Laterality Date  . CRANIOPLASTY N/A 03/19/2018   Procedure: CRANIOPLASTY;  Surgeon: 05/19/2018, MD;  Location: [facility] OR;  Service: Neurosurgery;  Laterality: N/A;  CRANIOPLASTY  . CRANIOTOMY Left 07/29/2017   Procedure: LEFT HEMI- CRANIECTOMY WITH PLACEMENT OF BONE FLAP IN ABDOMEN;  Surgeon: 07/31/2017, MD;  Location: [facility] OR;  Service: Neurosurgery;  Laterality: Left;  . IR ANGIO INTRA EXTRACRAN SEL COM CAROTID INNOMINATE UNI R MOD SED  07/28/2017  . IR ANGIO VERTEBRAL SEL VERTEBRAL UNI R MOD SED  07/28/2017  .  IR CT HEAD LTD  07/28/2017  . IR PERCUTANEOUS ART THROMBECTOMY/INFUSION INTRACRANIAL INC DIAG ANGIO  07/28/2017  . NASAL SEPTOPLASTY W/ TURBINOPLASTY Bilateral 03/06/2017   Procedure: NASAL SEPTOPLASTY WITH TURBINATE REDUCTION;  Surgeon: 03/08/2017, MD;  Location: [facility] ORS;  Service: ENT;  Laterality: Bilateral;  . RADIOLOGY WITH ANESTHESIA N/A 07/27/2017   Procedure: RADIOLOGY WITH ANESTHESIA;  Surgeon: 09/24/2017, MD;  Location: [facility] OR;  Service: Radiology;  Laterality: N/A;    SOCIAL HISTORY: Social History   Socioeconomic History  . Marital status: Married    Spouse name: Not on file  . Number of children: Not on file  . Years of education: Not on file  . Highest education level: Not on file  Occupational History  . Not on file  Social Needs  . Financial resource strain: Not on file  . Food insecurity    Worry: Not on file    Inability: Not on file  . Transportation needs    Medical: Not on file    Non-medical: Not on file  Tobacco Use  . Smoking status: Never Smoker  . Smokeless tobacco: Never Used  Substance and Sexual Activity  . Alcohol use: Not Currently    Comment: RARE  . Drug use: No  . Sexual activity: Not on file  Lifestyle  . Physical activity    Days per week: Not on file    Minutes per session: Not on file  . Stress: Not on file  Relationships  . Social Herbalist on phone: Not on file    Gets together: Not on file    Attends religious service: Not on file    Active member of club or organization: Not on file    Attends meetings of clubs or organizations: Not on file    Relationship status: Not on file  . Intimate partner violence    Fear of current or ex partner: Not on file    Emotionally abused: Not on file    Physically abused: Not on file    Forced sexual activity: Not on file  Other Topics Concern  . Not on file  Social History Narrative  . Not on file    FAMILY HISTORY: Family History  Problem Relation Age of  Onset  . Hypertension Father   . Hypertension Brother     ALLERGIES:  is allergic to blood-group specific substance and lipitor [atorvastatin calcium].  MEDICATIONS:  Current Outpatient Medications  Medication Sig Dispense Refill  . amLODipine (NORVASC) 5 MG tablet Take 5 mg by mouth daily.     Marland Kitchen aspirin EC 81 MG tablet Take 1 tablet (81 mg total) by mouth daily. 30 tablet 0  . ELIQUIS 5 MG TABS tablet Take 1 tablet by mouth 2 (two) times daily.  10  . ezetimibe (ZETIA) 10 MG tablet Take 10 mg by mouth daily.     Marland Kitchen gabapentin (NEURONTIN) 400 MG capsule TAKE ONE CAPSULE BY MOUTH THREE TIMES A DAY 90 capsule 1  . loratadine (CLARITIN) 10 MG tablet Take 10 mg by mouth daily.    . rosuvastatin (CRESTOR) 10 MG tablet Take 10 mg by mouth daily.    Marland Kitchen spironolactone (ALDACTONE) 50 MG tablet Take 50 mg by mouth daily.     No current facility-administered medications for this visit.     REVIEW OF SYSTEMS:   Constitutional: Denies fevers, chills or abnormal night sweats Eyes: Denies blurriness of vision, double vision or watery eyes Ears, nose, mouth, throat, and face: Denies mucositis or sore throat Respiratory: Denies cough, dyspnea or wheezes Cardiovascular: Denies palpitation, chest discomfort or lower extremity swelling Gastrointestinal:  Denies nausea, heartburn or change in bowel habits Skin: Denies abnormal skin rashes Lymphatics: Denies new lymphadenopathy or easy bruising Neurological: Right-sided hemiparesis Behavioral/Psych: Mood is stable, no new changes  All other systems were reviewed with the patient and are negative.  PHYSICAL EXAMINATION: ECOG PERFORMANCE STATUS: 1 - Symptomatic but completely ambulatory  Vitals:   04/09/19 1341  BP: (!) 144/103  Pulse: 100  Resp: 18  Temp: 98.5 F (36.9 C)  SpO2: 98%   Filed Weights   04/09/19 1341  Weight: 210 lb 12.8 oz (95.6 kg)    GENERAL:alert, no distress and comfortable SKIN: skin color, texture, turgor are normal,  no rashes or significant lesions EYES: normal, conjunctiva are pink and non-injected, sclera clear OROPHARYNX:no exudate, no erythema and lips, buccal mucosa, and tongue normal  NECK: supple, thyroid normal size, non-tender, without nodularity LYMPH:  no palpable lymphadenopathy in the cervical, axillary or inguinal LUNGS: clear to auscultation and percussion with normal breathing effort HEART: regular rate & rhythm and no murmurs and no lower extremity edema ABDOMEN:abdomen soft, non-tender and normal bowel sounds Musculoskeletal:no cyanosis of digits and no clubbing  PSYCH: alert & oriented x 3 with fluent speech NEURO: Right upper extremity paralysis and left lower extremity weakness  LABORATORY DATA:  I have reviewed the data as listed Lab Results  Component Value Date  WBC 9.1 04/09/2018   HGB 12.4 (L) 04/09/2018   HCT 35.4 (L) 04/09/2018   MCV 87.5 04/09/2018   PLT 388 04/09/2018   Lab Results  Component Value Date   NA 138 04/09/2018   K 3.4 (L) 04/09/2018   CL 102 04/09/2018   CO2 29 04/09/2018    RADIOGRAPHIC STUDIES: I have personally reviewed the radiological reports and agreed with the findings in the report.  ASSESSMENT AND PLAN:  Dvt femoral (deep venous thrombosis) (HCC) Left leg DVT diagnosed March 2019 felt to be due to decreased mobility from a prior acute stroke. Current treatment: Eliquis since March 2019 Reason for continuation of Eliquis: Chronic venous thrombosis in left common femoral vein, left popliteal vein. Reason for consultation is to rule out hypercoagulability disorder and determine the duration of anticoagulation.  Counseling: I reviewed the recent testing for hypercoagulable risk factors including factor V Leiden, prothrombin gene mutation, protein C, protein S, Antithrombin III, homocystine, factor VIII, lupus anticoagulant: Patient has indeterminate lupus anticoagulant test which could be falsely positive because of his anticoagulation.   Plan: Unfortunately because of his physical condition with immobility of his right arm and decreased mobility of his right leg, he is at a high risk for recurrent acute DVTs. I recommended indefinite anticoagulation. If his performance status and arm movement improved dramatically then we may consider discontinuing it at a later time.  All questions were answered. The patient knows to call the clinic with any problems, questions or concerns.   Sabas Sous, MD 04/09/2019    I, Molly Dorshimer, am acting as scribe for Serena Croissant, MD.  I have reviewed the above documentation for accuracy and completeness, and I agree with the above.

## 2019-04-09 ENCOUNTER — Other Ambulatory Visit: Payer: Self-pay

## 2019-04-09 ENCOUNTER — Inpatient Hospital Stay: Payer: BLUE CROSS/BLUE SHIELD | Attending: Hematology and Oncology | Admitting: Hematology and Oncology

## 2019-04-09 DIAGNOSIS — Z8673 Personal history of transient ischemic attack (TIA), and cerebral infarction without residual deficits: Secondary | ICD-10-CM | POA: Diagnosis not present

## 2019-04-09 DIAGNOSIS — Z7901 Long term (current) use of anticoagulants: Secondary | ICD-10-CM | POA: Diagnosis not present

## 2019-04-09 DIAGNOSIS — Z7982 Long term (current) use of aspirin: Secondary | ICD-10-CM | POA: Insufficient documentation

## 2019-04-09 DIAGNOSIS — I82512 Chronic embolism and thrombosis of left femoral vein: Secondary | ICD-10-CM | POA: Insufficient documentation

## 2019-04-09 DIAGNOSIS — Z86711 Personal history of pulmonary embolism: Secondary | ICD-10-CM | POA: Diagnosis not present

## 2019-04-09 DIAGNOSIS — I82532 Chronic embolism and thrombosis of left popliteal vein: Secondary | ICD-10-CM | POA: Insufficient documentation

## 2019-04-09 NOTE — Assessment & Plan Note (Signed)
Left leg DVT diagnosed March 2019 felt to be due to decreased mobility from a prior acute stroke. Current treatment: Eliquis since March 2019 Reason for continuation of Eliquis: Chronic venous thrombosis in left common femoral vein, left popliteal vein. Reason for consultation is to rule out hypercoagulability disorder  Plan: 1.  Test the patient for hypercoagulable risk factors including factor V Leiden, prothrombin gene mutation, protein C, protein S, Antithrombin III, homocystine, factor VIII, lupus anticoagulant 2.  If the patient is negative for risk factors and he may be able to come off anticoagulation. Unfortunately he is at a high risk for recurrent acute DVTs. If he develops any future acute DVT then he will need to remain on blood thinners for life.

## 2019-04-11 ENCOUNTER — Encounter: Payer: BLUE CROSS/BLUE SHIELD | Admitting: Physical Medicine & Rehabilitation

## 2019-04-11 ENCOUNTER — Encounter: Payer: Self-pay | Admitting: Physical Medicine & Rehabilitation

## 2019-04-11 ENCOUNTER — Other Ambulatory Visit: Payer: Self-pay

## 2019-04-17 ENCOUNTER — Telehealth: Payer: Self-pay | Admitting: *Deleted

## 2019-04-17 NOTE — Telephone Encounter (Signed)
Spoke with patient's wife. Verbalized understanding of finding and recommendations of Hematologist. Continue care with PCP for medical management.

## 2019-04-21 ENCOUNTER — Ambulatory Visit: Payer: BLUE CROSS/BLUE SHIELD | Admitting: Adult Health

## 2019-04-21 ENCOUNTER — Other Ambulatory Visit: Payer: Self-pay

## 2019-04-21 ENCOUNTER — Encounter: Payer: Self-pay | Admitting: Adult Health

## 2019-04-21 VITALS — BP 150/102 | HR 119 | Temp 97.8°F | Ht 71.0 in | Wt 214.0 lb

## 2019-04-21 DIAGNOSIS — I69351 Hemiplegia and hemiparesis following cerebral infarction affecting right dominant side: Secondary | ICD-10-CM

## 2019-04-21 DIAGNOSIS — I10 Essential (primary) hypertension: Secondary | ICD-10-CM

## 2019-04-21 DIAGNOSIS — I82412 Acute embolism and thrombosis of left femoral vein: Secondary | ICD-10-CM | POA: Diagnosis not present

## 2019-04-21 DIAGNOSIS — I639 Cerebral infarction, unspecified: Secondary | ICD-10-CM

## 2019-04-21 DIAGNOSIS — I63512 Cerebral infarction due to unspecified occlusion or stenosis of left middle cerebral artery: Secondary | ICD-10-CM

## 2019-04-21 DIAGNOSIS — E785 Hyperlipidemia, unspecified: Secondary | ICD-10-CM

## 2019-04-21 NOTE — Progress Notes (Signed)
[facility] [address] [place]. [place] [address] [phone]       OFFICE FOLLOW-UP NOTE  Jeremy. Jeremy [name] Date of Birth:  March 05, 1976 Medical Record Number:  [id number]   HPI:  Initial visit 11/08/17 PS: Jeremy Mccammon is a pleasant 43 year old Asian male seen today for first office follow-up visit following hospital admission for stroke in January 2019. He is accompanied by his wife. History is obtained from them as well as review of electronic medical records. I personally reviewed imaging films.Jeremy [name] is a 43 y.o. male past history of hypertension, on antihypertensives, who was in usual state of health last seen normal at 8 PM today by his wife, was noted to fall off of bed around 8:10 PM and found to have left gaze preference, right facial droop, slurred speech and right-sided weakness.  EMS arrived on site, blood pressure was in the normal range.  CBG was in the normal range.  He had dense right hemiplegia, right-sided neglect, left gaze preference, right facial droop and initially incoherent speech but then only slurred speech. He was brought into the emergency room at [facility] and evaluated on the bridge.  On arrival, initial NIH stroke scale was 20.  Noncontrast CT of the head was negative for bleed.  It did show a dense left MCA and and aspects of 8.  IV TPA was initiated at 9:11 PM.  CT angiogram and perfusion study were done.  Confirmed the left MCA occlusion.  Endovascular team was consulted even prior to obtaining the CT angiogram and the patient was taken in for endovascular thrombectomy.  LKW: 8 PM on 07/27/2017 tpa given?: yes Premorbid modified Rankin scale (mRS): 0 he had unsuccessful  mechanical embolectomy with small subarachnoid hemorrhage and cytotoxic edema  needing emergent hemicraniectomy on 07/29/2017 by Dr.Nundkumar with placement of the bone flap in the abdomen. Patient was admitted in the neurological intensive care unit where he initially needed mechanical  ventilation and hypertonic saline. These were gradually withdrawn as his condition improved. He is transferred to inpatient rehabilitation and has done well and is currently at home with his wife. He developed deep and thrombosis in the left eye in March and has been started on eliquis. Patient is able to walk with a walker. He is states his balance is not great but he is had no falls or injuries. He does need help with some activities of daily living but is able to feed himself. He can be left alone for hours. He needs some help with transfers.She is able to speak well and has good understanding and no cognitive deficits. His blood pressure is well controlled today it is 128/95. He has finished home physical therapy and plans to start outpatient physical and occupational therapy in [place] soon. Transesophageal echocardiogram was attempted while he was in the hospital but was unsuccessful.The etiology of his stroke is still unclear and now with the diagnosis of DVT possibility of paradoxical embolism and PFO is to be ruled out. He denies any prior history of DVT pulmonary embolism or family history of strokes or heart attacks at a young age.  02/07/18 UPDATE: Patient is being seen today for follow-up appointment and is accompanied by his wife.  He continues to have right hemiparesis with improvement compared to last visit.  He is currently walking with assistance of walker and foot drop brace on right lower extremity.  Per wife, insurances not allowing additional therapy visits but will be going to [employer] at  their physical therapy school next week for additional therapy.  He does state he does exercises at home on his own.  He continues to take both aspirin and Eliquis without side effects of bleeding or bruising.  Continues to take Crestor and Zetia without side effects myalgias.  Blood pressure today satisfactory 122/82.  He does follow with Dr. Clementeen Hoof for Botox injections and will be  seeing him on Monday for additional injections.  Patient and wife questioning clearance for replacement of skull cap and this office did receive clearance paperwork.  Patient did have recent fall while him and his wife were out to dinner and he was not wearing his helmet.  Per notes, patient may have lost consciousness but was fully assessed in the ED and this did not cause complications.  Patient was discharged back home.  Denies new or worsening stroke/TIA symptoms.  Virtual visit 10/16/2018:  He was initially scheduled for face-to-face office visit today at this time but due to [facility] office visit rescheduled for non-face-to-face telephone visit.  Prior follow-up visit on 02/07/2018 with residual deficits of spastic right hemiplegia and planning on undergoing cranioplasty.  Follow-up visit scheduled after that time but unfortunately patient canceled and was a no-show to an additional rescheduled visit.  He underwent cranioplasty on 03/19/2018 without complication.  He was restarted on Eliquis after procedure.  He has continued on Eliquis since 09/2017 after evidence of DVT in left common femoral vein through the left tibial veins. Spoke to Learned, patient's wife, regarding update of patient's condition.  Patient was in the background asking for answering questions.  He continues to have residual left hemiparesis but has been participating in PT/OT at [facility].  Unfortunately on hold at this time due to pandemic but plans on restarting once able.  Wife does endorse ongoing exercises at home as recommended during therapy sessions.  He also has been receiving Botox injections for left spasticity which has been providing benefit.  He has continued on Eliquis and aspirin without side effects of bleeding or bruising.  Wife is questioning duration of Eliquis as PCP deferred duration determination to our office per wife.  He does have appointment scheduled tomorrow with Dr. Kieth Brightly  neuropsychology for cognitive evaluation.  No further concerns at this time.  Denies new or worsening stroke/TIA symptoms.  Update 04/21/2019: Jeremy. Dingley is being seen today for stroke follow-up.  Residual deficits of right spastic hemiplegia.  He continues to receive Botox injections with benefit.  Currently ambulating with a cane but is able to ambulate without device within his home.  Repeat lower extremity Doppler ultrasound showed chronic venous thrombosis of left common femoral vein and left popliteal vein therefore refer to vascular surgery who performed hypercoagulable testing largely unremarkable and referred to hematology for determination of ongoing use of Eliquis and possible underlying hematology disorder.  Due to physical condition with immobility of right leg and right arm, high risk of recurrent DVTs therefore recommend indefinite use of anticoagulation unless mobility dramatically improves.  He has continued on Eliquis and aspirin 81 mg daily without bleeding or bruising.  Blood pressure today elevated at 150/102.  Recent increase of amlodipine by PCP due to recent increase of blood pressures.  He does monitor at home and typical SBP 140s-150s with slight improvement since increasing dosage last week.  Wife is questioning TEE to rule out PFO as this was attempted to be performed during hospitalization but unsuccessful.  Order placed on initial visit by Dr. Pearlean Brownie but has not  been completed.  No further concerns at this time.     ROS:   14 system review of systems is positive for weakness and all other systems negative   PMH:  Past Medical History:  Diagnosis Date   Asthma    MILD AS A CHILD   Deviated septum    Dyspnea    NORMAL SPIROMETRY 02/01/17 VISIT WITH DR HEDRICK   Hypertension    Sleep apnea    Stroke [facility])     Social History:  Social History   Socioeconomic History   Marital status: Married    Spouse name: Not on file   Number of children: Not on file    Years of education: Not on file   Highest education level: Not on file  Occupational History   Not on file  Social Needs   Financial resource strain: Not on file   Food insecurity    Worry: Not on file    Inability: Not on file   Transportation needs    Medical: Not on file    Non-medical: Not on file  Tobacco Use   Smoking status: Never Smoker   Smokeless tobacco: Never Used  Substance and Sexual Activity   Alcohol use: Not Currently    Comment: RARE   Drug use: No   Sexual activity: Not on file  Lifestyle   Physical activity    Days per week: Not on file    Minutes per session: Not on file   Stress: Not on file  Relationships   Social connections    Talks on phone: Not on file    Gets together: Not on file    Attends religious service: Not on file    Active member of club or organization: Not on file    Attends meetings of clubs or organizations: Not on file    Relationship status: Not on file   Intimate partner violence    Fear of current or ex partner: Not on file    Emotionally abused: Not on file    Physically abused: Not on file    Forced sexual activity: Not on file  Other Topics Concern   Not on file  Social History Narrative   Not on file    Medications:   Current Outpatient Medications on File Prior to Visit  Medication Sig Dispense Refill   amLODipine (NORVASC) 5 MG tablet Take 5 mg by mouth daily.      aspirin EC 81 MG tablet Take 1 tablet (81 mg total) by mouth daily. 30 tablet 0   ELIQUIS 5 MG TABS tablet Take 1 tablet by mouth 2 (two) times daily.  10   ezetimibe (ZETIA) 10 MG tablet Take 10 mg by mouth daily.      gabapentin (NEURONTIN) 400 MG capsule TAKE ONE CAPSULE BY MOUTH THREE TIMES A DAY 90 capsule 1   loratadine (CLARITIN) 10 MG tablet Take 10 mg by mouth daily.     rosuvastatin (CRESTOR) 10 MG tablet Take 10 mg by mouth daily.     spironolactone (ALDACTONE) 50 MG tablet Take 50 mg by mouth daily.     No current  facility-administered medications on file prior to visit.     Allergies:   Allergies  Allergen Reactions   Blood-Group Specific Substance Other (See Comments)    PT [facility] WITNESS DOES NOT ACCEPT BLOOD PRODUCTS   Lipitor [Atorvastatin Calcium] Other (See Comments)    myalgia    Physical Exam  Today's Vitals  04/21/19 1436  BP: (!) 150/102  Pulse: (!) 119  Temp: 97.8 F (36.6 C)  TempSrc: Oral  Weight: 214 lb (97.1 kg)  Height: 5\' 11"  (1.803 m)   Body mass index is 29.85 kg/m.  General: well developed, well nourished pleasant middle-aged Asian male, seated, in no evident distress Head: head normocephalic and atraumatic.  Neck: supple with no carotid or supraclavicular bruits Cardiovascular: regular rate and rhythm, no murmurs Musculoskeletal: no deformity Skin:  no rash/petichiae Vascular:  Normal pulses all extremities  Neurologic Exam Mental Status: Awake and fully alert. Oriented to place and time. Recent and remote memory intact. Attention span, concentration and fund of knowledge appropriate. Mood and affect appropriate. No dysarthria or aphasia. Able to name ,comprehend and repeat well Cranial Nerves: Pupils equal, briskly reactive to light. Extraocular movements full without nystagmus. Visual fields full to confrontation. Hearing intact. Facial sensation intact. Moderate right lower facial weakness., tongue, palate moves normally and symmetrically.  Motor: spastic right hemiplegia with right upper and lower extremityt. 1/5 strength in the right upper extremity. 4/5 strength in the right lower extremity with hip flexor and ankle dorsiflexor weakness with AFO in place. Tone is increased on the right.  Full strength left upper and lower extremity. Sensory.: intact to touch ,pinprick .position and vibratory sensation.  Coordination: impaired on the right and normal on the left. Gait and Station: With assistance of cane, patient has hemiplegic gait on right side but is  able to ambulate without assistance Reflexes: 2+ and asymmetric and brisker on the right.. Toes downgoing.     ASSESSMENT: 43 year old Asian male with large left middle cerebral artery infarct  with cytotoxic edema of cryptogenic etiology status post hemicraniectomy in January 2019 with residual spastic right hemiplegia who is otherwise doing well.  On 09/2017, found to have LLE DVT and small right lung PE therefore Eliquis initiated.  Repeat Dopplers show chronic DVT therefore referred to vascular surgery who performed extensive hypercoagulability work-up and referred to hematologist for further evaluation and determination on ongoing use of Eliquis.  Recommended continued use of Eliquis likely lifelong due to immobility of left leg.    PLAN: -Continue Eliquis (apixaban) daily  and Crestor for secondary stroke prevention  -Advised to discontinue aspirin 81 mg with no prior history of stents or CAD.  He will likely continue Eliquis lifelong but did advise wife and patient that if Eliquis is discontinued in the future would be recommended to restart aspirin 81 mg daily for secondary stroke prevention. -Order placed for TEE to rule out potential PFO due to cryptogenic etiology of stroke -F/u with PCP regarding your HLD and HTN management -currently elevated and advised to continue to monitor at home and ongoing follow-up with PCP for management and monitoring -Continue home exercises as tolerated -Maintain strict control of hypertension with blood pressure goal below 130/90, diabetes with hemoglobin A1c goal below 6.5% and cholesterol with LDL cholesterol (bad cholesterol) goal below 70 mg/dL. I also advised the patient to eat a healthy diet with plenty of whole grains, cereals, fruits and vegetables, exercise regularly and maintain ideal body weight.   Follow-up will be determined after TEE -if unremarkable, he may follow-up as needed as overall stable from a stroke standpoint  Greater than 50% of  time during this 25 minute visit was spent on counseling,explanation of diagnosis MCA infarct, hemicraniectomy, planning of further management, discussion regarding residual deficits, discussion regarding additional testing, discussion with patient and family and coordination of care  Ihor AustinJessica McCue, AGNP-BC  [facility] [address] [place], [place] [address]  Phone [phone] Fax [phone] Note: This document was prepared with digital dictation and possible smart phrase technology. Any transcriptional errors that result from this process are unintentional.

## 2019-04-21 NOTE — Patient Instructions (Signed)
Continue Eliquis (apixaban) daily  and Crestor for secondary stroke prevention  Discontinue aspirin 81 mg daily at this time as no indication for ongoing use -if Eliquis discontinued, no, would be recommended to restart aspirin 81 mg daily for secondary stroke prevention  Continue to follow up with PCP regarding cholesterol and blood pressure management   Order placed for TEE for further evaluation of possible PFO  Continue to monitor blood pressure at home  Maintain strict control of hypertension with blood pressure goal below 130/90, diabetes with hemoglobin A1c goal below 6.5% and cholesterol with LDL cholesterol (bad cholesterol) goal below 70 mg/dL. I also advised the patient to eat a healthy diet with plenty of whole grains, cereals, fruits and vegetables, exercise regularly and maintain ideal body weight.         Thank you for coming to see [place] at [facility]. I hope we have been able to provide you high quality care today.  You may receive a patient satisfaction survey over the next few weeks. We would appreciate your feedback and comments so that we may continue to improve ourselves and the health of our patients.

## 2019-04-22 ENCOUNTER — Telehealth: Payer: Self-pay | Admitting: Adult Health

## 2019-04-22 ENCOUNTER — Encounter: Payer: Self-pay | Admitting: Adult Health

## 2019-04-22 NOTE — Telephone Encounter (Signed)
Call and schedule echo and pre - cert

## 2019-04-22 NOTE — Progress Notes (Signed)
I agree with the above plan 

## 2019-04-24 ENCOUNTER — Other Ambulatory Visit: Payer: Self-pay | Admitting: Physical Medicine & Rehabilitation

## 2019-04-24 NOTE — Addendum Note (Signed)
Addended by: Mal Misty on: 04/24/2019 09:44 AM   Modules accepted: Orders

## 2019-04-24 NOTE — Telephone Encounter (Signed)
Called and spoke to patient's wife and relayed we had to send him to a cardiologist and the cardiologist had to decide about the TEE.

## 2019-04-25 ENCOUNTER — Ambulatory Visit (INDEPENDENT_AMBULATORY_CARE_PROVIDER_SITE_OTHER): Payer: BLUE CROSS/BLUE SHIELD | Admitting: Cardiology

## 2019-04-25 ENCOUNTER — Other Ambulatory Visit: Payer: Self-pay

## 2019-04-25 ENCOUNTER — Ambulatory Visit (INDEPENDENT_AMBULATORY_CARE_PROVIDER_SITE_OTHER): Payer: BLUE CROSS/BLUE SHIELD

## 2019-04-25 ENCOUNTER — Encounter: Payer: Self-pay | Admitting: Cardiology

## 2019-04-25 ENCOUNTER — Other Ambulatory Visit
Admission: RE | Admit: 2019-04-25 | Discharge: 2019-04-25 | Disposition: A | Discharge status: Home / Self Care | Payer: BLUE CROSS/BLUE SHIELD | Source: Ambulatory Visit | Attending: Cardiology | Admitting: Cardiology

## 2019-04-25 VITALS — BP 145/96 | HR 86 | Ht 71.0 in | Wt 212.0 lb

## 2019-04-25 DIAGNOSIS — I639 Cerebral infarction, unspecified: Secondary | ICD-10-CM

## 2019-04-25 DIAGNOSIS — Z20828 Contact with and (suspected) exposure to other viral communicable diseases: Secondary | ICD-10-CM | POA: Insufficient documentation

## 2019-04-25 DIAGNOSIS — I10 Essential (primary) hypertension: Secondary | ICD-10-CM | POA: Diagnosis not present

## 2019-04-25 DIAGNOSIS — I82402 Acute embolism and thrombosis of unspecified deep veins of left lower extremity: Secondary | ICD-10-CM | POA: Diagnosis not present

## 2019-04-25 DIAGNOSIS — Z01812 Encounter for preprocedural laboratory examination: Secondary | ICD-10-CM | POA: Diagnosis not present

## 2019-04-25 NOTE — Patient Instructions (Addendum)
Medication Instructions:  - Your physician recommends that you continue on your current medications as directed. Please refer to the Current Medication list given to you today.  If you need a refill on your cardiac medications before your next appointment, please call your pharmacy.   Lab work: - Pre-procedure COVID swab: today- drive around to the [facility] entrance and they will come out to the car to swab you.  If you have labs (blood work) drawn today and your tests are completely normal, you will receive your results only by:  MyChart Message (if you have MyChart) OR  A paper copy in the mail If you have any lab test that is abnormal or we need to change your treatment, we will call you to review the results.  Testing/Procedures: - Your physician has requested that you have a TEE. During a TEE, sound waves are used to create images of your heart. It provides your doctor with information about the size and shape of your heart and how well your hearts chambers and valves are working. In this test, a transducer is attached to the end of a flexible tube thats guided down your throat and into your esophagus (the tube leading from you mouth to your stomach) to get a more detailed image of your heart. You are not awake for the procedure. Please see the instruction sheet given to you today. For further information please visit [email or web address].  You are scheduled for a Transesophageal Echocardiogram (TEE) on Wednesday 04/30/19 with Dr. Azucena CecilAgbor-Etang.  [facility] at 6:30 am on the day of your procedure.  DIET INSTRUCTIONS:  Nothing to eat or drink after midnight except your medications with a              sip of water.         1) Labs: Pre-procedure COVID swab- today (see above)  2) Medications:  You may take all of your medications with a small amount of water the morning of your procedure  3) Must have a responsible person to drive you  home.  4) Bring a current list of your medications and current insurance cards.    If you have any questions after you get home, please call the office at [phone]  - Your physician has recommended that you wear a 2 week Zio monitor. This monitor is a medical device that records the hearts electrical activity. Doctors most often use these monitors to diagnose arrhythmias. Arrhythmias are problems with the speed or rhythm of the heartbeat. The monitor is a small device applied to your chest. You can wear one while you do your normal daily activities. While wearing this monitor if you have any symptoms to push the button and record what you felt. Once you have worn this monitor for the period of time provider prescribed (Usually 14 days), you will return the monitor device in the postage paid box. Once it is returned they will download the data collected and provide us with a report which the provider will then review and we will call you with those results. Important tips:  1. Avoid showering during the first 24 hours of wearing the monitor. 2. Avoid excessive sweating to help maximize wear time. 3. Do not submerge the device, no hot tubs, and no swimming pools. 4. Keep any lotions or oils away from the patch. 5. After 24 hours you may shower with the patch on. Take brief showers with your back facing the  shower head.  6. Do not remove patch once it has been placed because that will interrupt data and decrease adhesive wear time. 7. Push the button when you have any symptoms and write down what you were feeling. 8. Once you have completed wearing your monitor, remove and place into box which has postage paid and place in your outgoing mailbox.  9. If for some reason you have misplaced your box then call our office and we can provide another box and/or mail it off for you.         Follow-Up: At [facility], you and your health needs are our priority.  As part of our continuing mission  to provide you with exceptional heart care, we have created designated Provider Care Teams.  These Care Teams include your primary Cardiologist (physician) and Advanced Practice Providers (APPs -  Physician Assistants and Nurse Practitioners) who all work together to provide you with the care you need, when you need it.  in 4 weeks with Dr. Garen Lah  Any Other Special Instructions Will Be Listed Below (If Applicable). - N/A   Transesophageal Echocardiogram Transesophageal echocardiogram (TEE) is a test that uses sound waves to take pictures of your heart. TEE is done by passing a flexible tube down the esophagus. The esophagus is the tube that carries food from the throat to the stomach. The pictures give detailed images of your heart. This can help your doctor see if there are problems with your heart. What happens before the procedure? Staying hydrated Follow instructions from your doctor about hydration, which may include:  Up to 3 hours before the procedure - you may continue to drink clear liquids, such as: ? Water. ? Clear fruit juice. ? Black coffee. ? Plain tea.  Eating and drinking Follow instructions from your doctor about eating and drinking, which may include:  8 hours before the procedure - stop eating heavy meals or foods such as meat, fried foods, or fatty foods.  6 hours before the procedure - stop eating light meals or foods, such as toast or cereal.  6 hours before the procedure - stop drinking milk or drinks that contain milk.  3 hours before the procedure - stop drinking clear liquids. General instructions  You will need to take out any dentures or retainers.  Plan to have someone take you home from the hospital or clinic.  If you will be going home right after the procedure, plan to have someone with you for 24 hours.  Ask your doctor about: ? Changing or stopping your normal medicines. This is important if you take diabetes medicines or blood  thinners. ? Taking over-the-counter medicines, vitamins, herbs, and supplements. ? Taking medicines such as aspirin and ibuprofen. These medicines can thin your blood. Do not take these medicines unless your doctor tells you to take them. What happens during the procedure?  To lower your risk of infection, your doctors will wash or clean their hands.  An IV will be put into one of your veins.  You will be given a medicine to help you relax (sedative).  A medicine may be sprayed or gargled. This numbs the back of your throat.  Your blood pressure, heart rate, and breathing will be watched.  You may be asked to lay on your left side.  A bite block will be placed in your mouth. This keeps you from biting the tube.  The tip of the TEE probe will be placed into the back of your mouth.  You will be asked to swallow.  Your doctor will take pictures of your heart.  The probe and bite block will be taken out. The procedure may vary among doctors and hospitals. What happens after the procedure?   Your blood pressure, heart rate, breathing rate, and blood oxygen level will be watched until the medicines you were given have worn off.  When you first wake up, your throat may feel sore and numb. This will get better over time. You will not be allowed to eat or drink until the numbness has gone away.  Do not drive for 24 hours if you were given a medicine to help you relax. Summary  TEE is a test that uses sound waves to take pictures of your heart.  You will be given a medicine to help you relax.  Do not drive for 24 hours if you were given a medicine to help you relax. This information is not intended to replace advice given to you by your health care provider. Make sure you discuss any questions you have with your health care provider. Document Released: 04/30/2009 Document Revised: 03/22/2018 Document Reviewed: 10/04/2016 Elsevier Patient Education  2020 [employer].

## 2019-04-25 NOTE — Progress Notes (Signed)
Cardiology Office Note:    Date:  04/25/2019   ID:  Jeremy [name], DOB 1975/09/08, MRN [id number]  PCP:  [name], MD  Cardiologist:  Debbe Odea, MD  Electrophysiologist:  None   Referring MD: Ihor Austin, NP   Chief Complaint  Patient presents with   New Patient (Initial Visit)    Patient c/o swelling in legs that comes and goes. Meds reviewed verbally with patient.     History of Present Illness:    Jeremy [name] is a 43 y.o. male with a hx of pretension, recent CVA who presents due to some swelling in his legs.  Patient with history of stroke, January 2019, when he was at home and was noted to fall out of his bed.  He was found to have right facial droop and slurred speech.  He was brought to the emergency room to [facility] in [place] by EMS.  He received TPA, after head CT did not reveal any bleeding.  CTA did not show acute infarct.  Subsequently underwent thrombectomy.  Etiology of his stroke remains unclear as TEE attempted in the hospital was unsuccessful.  He was up frequently diagnosed with DVT in the left common femoral vein and started on Eliquis as of March 2019.  Patient is being followed by neurology and evaluation demonstrate immobility of his right leg and right arm.  He was deemed high risk for recurrent DVTs and the neurology service recommended continued Eliquis unless mobility improves.  Denies any history of palpitations, smoking.  He states having normal speech, able to swallow.   Past Medical History:  Diagnosis Date   Asthma    MILD AS A CHILD   Deviated septum    Dyspnea    NORMAL SPIROMETRY 02/01/17 VISIT WITH DR HEDRICK   Hypertension    Sleep apnea    Stroke [facility])     Past Surgical History:  Procedure Laterality Date   CRANIOPLASTY N/A 03/19/2018   Procedure: CRANIOPLASTY;  Surgeon: Lisbeth Renshaw, MD;  Location: [facility] OR;  Service: Neurosurgery;  Laterality: N/A;  CRANIOPLASTY   CRANIOTOMY Left 07/29/2017   Procedure: LEFT HEMI-  CRANIECTOMY WITH PLACEMENT OF BONE FLAP IN ABDOMEN;  Surgeon: Lisbeth Renshaw, MD;  Location: [facility] OR;  Service: Neurosurgery;  Laterality: Left;   IR ANGIO INTRA EXTRACRAN SEL COM CAROTID INNOMINATE UNI R MOD SED  07/28/2017   IR ANGIO VERTEBRAL SEL VERTEBRAL UNI R MOD SED  07/28/2017   IR CT HEAD LTD  07/28/2017   IR PERCUTANEOUS ART THROMBECTOMY/INFUSION INTRACRANIAL INC DIAG ANGIO  07/28/2017   NASAL SEPTOPLASTY W/ TURBINOPLASTY Bilateral 03/06/2017   Procedure: NASAL SEPTOPLASTY WITH TURBINATE REDUCTION;  Surgeon: Linus Salmons, MD;  Location: [facility] ORS;  Service: ENT;  Laterality: Bilateral;   RADIOLOGY WITH ANESTHESIA N/A 07/27/2017   Procedure: RADIOLOGY WITH ANESTHESIA;  Surgeon: Julieanne Cotton, MD;  Location: [facility] OR;  Service: Radiology;  Laterality: N/A;    Current Medications: Current Meds  Medication Sig   amLODipine (NORVASC) 10 MG tablet Take 10 mg by mouth daily.   ELIQUIS 5 MG TABS tablet Take 1 tablet by mouth 2 (two) times daily.   ezetimibe (ZETIA) 10 MG tablet Take 10 mg by mouth daily.    gabapentin (NEURONTIN) 400 MG capsule TAKE ONE CAPSULE BY MOUTH THREE TIMES A DAY   loratadine (CLARITIN) 10 MG tablet Take 10 mg by mouth daily.   rosuvastatin (CRESTOR) 10 MG tablet Take 10 mg by mouth daily.   spironolactone (ALDACTONE) 50 MG tablet Take  50 mg by mouth daily.     Allergies:   Blood-group specific substance and Lipitor [atorvastatin calcium]   Social History   Socioeconomic History   Marital status: Married    Spouse name: Not on file   Number of children: Not on file   Years of education: Not on file   Highest education level: Not on file  Occupational History   Not on file  Social Needs   Financial resource strain: Not on file   Food insecurity    Worry: Not on file    Inability: Not on file   Transportation needs    Medical: Not on file    Non-medical: Not on file  Tobacco Use   Smoking status: Never Smoker   Smokeless  tobacco: Never Used  Substance and Sexual Activity   Alcohol use: Not Currently    Comment: RARE   Drug use: No   Sexual activity: Not on file  Lifestyle   Physical activity    Days per week: Not on file    Minutes per session: Not on file   Stress: Not on file  Relationships   Social connections    Talks on phone: Not on file    Gets together: Not on file    Attends religious service: Not on file    Active member of club or organization: Not on file    Attends meetings of clubs or organizations: Not on file    Relationship status: Not on file  Other Topics Concern   Not on file  Social History Narrative   Not on file     Family History: The patient's family history includes Hypertension in his brother and father.  ROS:   Please see the history of present illness.     All other systems reviewed and are negative.  EKGs/Labs/Other Studies Reviewed:    The following studies were reviewed today:  Ultrasound venous date 10/05/2017 IMPRESSION: Examination is positive for extensive, predominantly occlusive, DVT extending from the left common femoral vein through the left tibial veins  Ultrasound venous date 10/18/2018 Summary: Left: Findings consistent with chronic deep vein thrombosis involving the left common femoral vein, left femoral vein, and left popliteal vein. The profunda vein is patent. There is spontaneous flow in the IVC and right common iliac vein, external iliac vein. The left common iliac vein appears patent proximally with chronic occlusion distally and in the external iliac vein.   Echo 07/29/2017 Study Conclusions  - Left ventricle: The cavity size was normal. Wall thickness was   increased in a pattern of mild LVH. Systolic function was normal.    The estimated ejection fraction was in the range of 60% to 65%.   Wall motion was normal; there were no regional wall motion   abnormalities. Left ventricular diastolic function parameters   were  normal. - Atrial septum: No defect or patent foramen ovale was identified.   EKG:  EKG is  ordered today.  The ekg ordered today demonstrates sinus rhythm, normal ECG.  Recent Labs: No results found for requested labs within last 8760 hours.  Recent Lipid Panel    Component Value Date/Time   CHOL 218 (H) 07/28/2017 0259   TRIG 316 (H) 07/30/2017 2323   HDL 28 (L) 07/28/2017 0259   CHOLHDL 7.8 07/28/2017 0259   VLDL UNABLE TO CALCULATE IF TRIGLYCERIDE OVER 400 mg/dL 16/04/9603 5409   LDLCALC UNABLE TO CALCULATE IF TRIGLYCERIDE OVER 400 mg/dL 81/19/1478 2956    Physical Exam:  VS:  BP (!) 145/96 (BP Location: Left Arm, Patient Position: Sitting, Cuff Size: Normal)    Pulse 86    Ht 5\' 11"  (1.803 m)    Wt 212 lb (96.2 kg)    BMI 29.57 kg/m     Wt Readings from Last 3 Encounters:  04/25/19 212 lb (96.2 kg)  04/21/19 214 lb (97.1 kg)  04/11/19 210 lb (95.3 kg)     GEN:  Well nourished, well developed in no acute distress, patient sitting in wheelchair. HEENT: Normal NECK: No JVD; No carotid bruits LYMPHATICS: No lymphadenopathy CARDIAC: RRR, no murmurs, rubs, gallops RESPIRATORY:  Clear to auscultation without rales, wheezing or rhonchi  ABDOMEN: Soft, non-tender, non-distended MUSCULOSKELETAL: Right arm weakness noted, right leg in brace. SKIN: Warm and dry NEUROLOGIC:  Alert and oriented x 3 PSYCHIATRIC:  Normal affect   ASSESSMENT:   Patient with history of stroke and DVT.  Not sure of etiology.  Due to history of DVT paradoxic embolus is also possible.  Will schedule TEE to evaluate for possible PFO. 1. Cryptogenic stroke ([place])   2. Deep vein thrombosis (DVT) of left lower extremity, unspecified chronicity, unspecified vein (HCC)   3. Essential hypertension    PLAN:    In order of problems listed above:  1. TEE with bubble study.  Will place a ZIO patch for 2 weeks to evaluate for possibility of paroxysmal atrial fibrillation.  Continue Eliquis as currently  prescribed.  Continue Lipitor 2. Continue Eliquis as currently prescribed 3. Continue Norvasc.  Blood pressure reasonably controlled if further decrease is advised as per neurology recommendations and another agent can be added..   Medication Adjustments/Labs and Tests Ordered: Current medicines are reviewed at length with the patient today.  Concerns regarding medicines are outlined above.  Orders Placed This Encounter  Procedures   LONG TERM MONITOR (3-14 DAYS)   EKG 12-Lead   No orders of the defined types were placed in this encounter.   Patient Instructions  Medication Instructions:  - Your physician recommends that you continue on your current medications as directed. Please refer to the Current Medication list given to you today.  If you need a refill on your cardiac medications before your next appointment, please call your pharmacy.   Lab work: -  If you have labs (blood work) drawn today and your tests are completely normal, you will receive your results only by:  [place] (if you have MyChart) OR  A paper copy in the mail If you have any lab test that is abnormal or we need to change your treatment, we will call you to review the results.  Testing/Procedures: - Your physician has requested that you have a TEE. During a TEE, sound waves are used to create images of your heart. It provides your doctor with information about the size and shape of your heart and how well your hearts chambers and valves are working. In this test, a transducer is attached to the end of a flexible tube thats guided down your throat and into your esophagus (the tube leading from you mouth to your stomach) to get a more detailed image of your heart. You are not awake for the procedure. Please see the instruction sheet given to you today. For further information please visit [email or web address].  - Your physician has recommended that you wear a 2 week Zio monitor. This monitor is a  medical device that records the hearts electrical activity. Doctors most often use these monitors to diagnose arrhythmias.  Arrhythmias are problems with the speed or rhythm of the heartbeat. The monitor is a small device applied to your chest. You can wear one while you do your normal daily activities. While wearing this monitor if you have any symptoms to push the button and record what you felt. Once you have worn this monitor for the period of time provider prescribed (Usually 14 days), you will return the monitor device in the postage paid box. Once it is returned they will download the data collected and provide us with a report which the provider will then review and we will call you with those results. Important tips:  1. Avoid showering during the first 24 hours of wearing the monitor. 2. Avoid excessive sweating to help maximize wear time. 3. Do not submerge the device, no hot tubs, and no swimming pools. 4. Keep any lotions or oils away from the patch. 5. After 24 hours you may shower with the patch on. Take brief showers with your back facing the shower head.  6. Do not remove patch once it has been placed because that will interrupt data and decrease adhesive wear time. 7. Push the button when you have any symptoms and write down what you were feeling. 8. Once you have completed wearing your monitor, remove and place into box which has postage paid and place in your outgoing mailbox.  9. If for some reason you have misplaced your box then call our office and we can provide another box and/or mail it off for you.         Follow-Up: At [facility], you and your health needs are our priority.  As part of our continuing mission to provide you with exceptional heart care, we have created designated Provider Care Teams.  These Care Teams include your primary Cardiologist (physician) and Advanced Practice Providers (APPs -  Physician Assistants and Nurse Practitioners) who all work  together to provide you with the care you need, when you need it.  in 4 weeks with Dr. Azucena CecilAgbor-Etang  Any Other Special Instructions Will Be Listed Below (If Applicable). - N/A   Transesophageal Echocardiogram Transesophageal echocardiogram (TEE) is a test that uses sound waves to take pictures of your heart. TEE is done by passing a flexible tube down the esophagus. The esophagus is the tube that carries food from the throat to the stomach. The pictures give detailed images of your heart. This can help your doctor see if there are problems with your heart. What happens before the procedure? Staying hydrated Follow instructions from your doctor about hydration, which may include:  Up to 3 hours before the procedure - you may continue to drink clear liquids, such as: ? Water. ? Clear fruit juice. ? Black coffee. ? Plain tea.  Eating and drinking Follow instructions from your doctor about eating and drinking, which may include:  8 hours before the procedure - stop eating heavy meals or foods such as meat, fried foods, or fatty foods.  6 hours before the procedure - stop eating light meals or foods, such as toast or cereal.  6 hours before the procedure - stop drinking milk or drinks that contain milk.  3 hours before the procedure - stop drinking clear liquids. General instructions  You will need to take out any dentures or retainers.  Plan to have someone take you home from the hospital or clinic.  If you will be going home right after the procedure, plan to have someone with you for 24  hours.  Ask your doctor about: ? Changing or stopping your normal medicines. This is important if you take diabetes medicines or blood thinners. ? Taking over-the-counter medicines, vitamins, herbs, and supplements. ? Taking medicines such as aspirin and ibuprofen. These medicines can thin your blood. Do not take these medicines unless your doctor tells you to take them. What happens during the  procedure?  To lower your risk of infection, your doctors will wash or clean their hands.  An IV will be put into one of your veins.  You will be given a medicine to help you relax (sedative).  A medicine may be sprayed or gargled. This numbs the back of your throat.  Your blood pressure, heart rate, and breathing will be watched.  You may be asked to lay on your left side.  A bite block will be placed in your mouth. This keeps you from biting the tube.  The tip of the TEE probe will be placed into the back of your mouth.  You will be asked to swallow.  Your doctor will take pictures of your heart.  The probe and bite block will be taken out. The procedure may vary among doctors and hospitals. What happens after the procedure?   Your blood pressure, heart rate, breathing rate, and blood oxygen level will be watched until the medicines you were given have worn off.  When you first wake up, your throat may feel sore and numb. This will get better over time. You will not be allowed to eat or drink until the numbness has gone away.  Do not drive for 24 hours if you were given a medicine to help you relax. Summary  TEE is a test that uses sound waves to take pictures of your heart.  You will be given a medicine to help you relax.  Do not drive for 24 hours if you were given a medicine to help you relax. This information is not intended to replace advice given to you by your health care provider. Make sure you discuss any questions you have with your health care provider. Document Released: 04/30/2009 Document Revised: 03/22/2018 Document Reviewed: 10/04/2016 Elsevier Patient Education  2020 [employer].       Signed, Debbe Odea, MD  04/25/2019 2:35 PM    [facility]

## 2019-04-26 LAB — SARS CORONAVIRUS 2 (TAT 6-24 HRS): SARS Coronavirus 2: NEGATIVE

## 2019-04-30 ENCOUNTER — Ambulatory Visit
Admission: RE | Admit: 2019-04-30 | Discharge: 2019-04-30 | Disposition: A | Discharge status: Home / Self Care | Payer: BLUE CROSS/BLUE SHIELD | Attending: Cardiology | Admitting: Cardiology

## 2019-04-30 ENCOUNTER — Ambulatory Visit (HOSPITAL_BASED_OUTPATIENT_CLINIC_OR_DEPARTMENT_OTHER)
Admission: RE | Admit: 2019-04-30 | Discharge: 2019-04-30 | Disposition: A | Discharge status: Home / Self Care | Payer: BLUE CROSS/BLUE SHIELD | Source: Home / Self Care | Attending: Cardiology | Admitting: Cardiology

## 2019-04-30 ENCOUNTER — Other Ambulatory Visit: Payer: Self-pay

## 2019-04-30 ENCOUNTER — Encounter
Admission: RE | Disposition: A | Discharge status: Home / Self Care | Payer: Self-pay | Source: Home / Self Care | Attending: Cardiology

## 2019-04-30 DIAGNOSIS — I69351 Hemiplegia and hemiparesis following cerebral infarction affecting right dominant side: Secondary | ICD-10-CM | POA: Diagnosis not present

## 2019-04-30 DIAGNOSIS — Z7982 Long term (current) use of aspirin: Secondary | ICD-10-CM | POA: Diagnosis not present

## 2019-04-30 DIAGNOSIS — Z79899 Other long term (current) drug therapy: Secondary | ICD-10-CM | POA: Insufficient documentation

## 2019-04-30 DIAGNOSIS — Z86711 Personal history of pulmonary embolism: Secondary | ICD-10-CM | POA: Insufficient documentation

## 2019-04-30 DIAGNOSIS — Z7901 Long term (current) use of anticoagulants: Secondary | ICD-10-CM | POA: Insufficient documentation

## 2019-04-30 DIAGNOSIS — I6389 Other cerebral infarction: Secondary | ICD-10-CM

## 2019-04-30 DIAGNOSIS — I10 Essential (primary) hypertension: Secondary | ICD-10-CM | POA: Insufficient documentation

## 2019-04-30 DIAGNOSIS — I639 Cerebral infarction, unspecified: Secondary | ICD-10-CM | POA: Diagnosis present

## 2019-04-30 DIAGNOSIS — Z86718 Personal history of other venous thrombosis and embolism: Secondary | ICD-10-CM | POA: Diagnosis not present

## 2019-04-30 DIAGNOSIS — G4730 Sleep apnea, unspecified: Secondary | ICD-10-CM | POA: Diagnosis not present

## 2019-04-30 HISTORY — PX: TEE WITHOUT CARDIOVERSION: SHX5443

## 2019-04-30 SURGERY — ECHOCARDIOGRAM, TRANSESOPHAGEAL
Anesthesia: Moderate Sedation

## 2019-04-30 MED ORDER — SODIUM CHLORIDE FLUSH 0.9 % IV SOLN
INTRAVENOUS | Status: AC
  Filled 2019-04-30: qty 10

## 2019-04-30 MED ORDER — SODIUM CHLORIDE 0.9 % IV SOLN
INTRAVENOUS | Status: DC
  Administered 2019-04-30: 08:00:00 via INTRAVENOUS

## 2019-04-30 MED ORDER — MIDAZOLAM HCL 2 MG/2ML IJ SOLN
INTRAMUSCULAR | Status: AC | PRN
  Administered 2019-04-30 (×3): 1 mg via INTRAVENOUS

## 2019-04-30 MED ORDER — MIDAZOLAM HCL 5 MG/5ML IJ SOLN
INTRAMUSCULAR | Status: AC
  Filled 2019-04-30: qty 5

## 2019-04-30 MED ORDER — FENTANYL CITRATE (PF) 100 MCG/2ML IJ SOLN
INTRAMUSCULAR | Status: AC
  Filled 2019-04-30: qty 2

## 2019-04-30 MED ORDER — LIDOCAINE VISCOUS HCL 2 % MT SOLN
OROMUCOSAL | Status: AC
  Administered 2019-04-30: 15 mL
  Filled 2019-04-30: qty 15

## 2019-04-30 MED ORDER — BUTAMBEN-TETRACAINE-BENZOCAINE 2-2-14 % EX AERO
INHALATION_SPRAY | CUTANEOUS | Status: AC
  Filled 2019-04-30: qty 5

## 2019-04-30 MED ORDER — FENTANYL CITRATE (PF) 100 MCG/2ML IJ SOLN
INTRAMUSCULAR | Status: AC | PRN
  Administered 2019-04-30 (×3): 25 ug via INTRAVENOUS

## 2019-04-30 NOTE — Progress Notes (Addendum)
Pt. Here for TEE. Right UE and LE weak: right hand contracted with limited arm ROM. Right LE weak: right leg brace noted. Pt. Able to stand per self with use of cane and leg brace. Assisted pt. To get undressed. Speech slurred. Pt. Has external heart monitor on.

## 2019-04-30 NOTE — Progress Notes (Signed)
*  PRELIMINARY RESULTS* Echocardiogram Echocardiogram Transesophageal has been performed.  Wallie Char Edi Gorniak 04/30/2019, 8:46 AM

## 2019-04-30 NOTE — Procedures (Addendum)
Transesophageal Echocardiogram :  Indication: stroke Requesting/ordering  physician:   Procedure: viscous lidocaine were given orally to provide local anesthesia to the oropharynx. The patient was positioned supine on the left side, bite block provided. The patient was moderately sedated with the doses of versed and fentanyl as detailed below.  Using digital technique an omniplane probe was advanced into the esophagus without incident.   Moderate sedation: 1. Sedation used:  Versed: 3mg , Fentanyl: 22mcg 2. Time administered:   8:10  Time when patient started recovery: 8:41. The period of conscious sedation was 31 minutes. 3. I was face to face during this time   See report in EPIC  for complete details: In brief, imaging revealed normal LV function with no RWMAs and no mural apical thrombus.  Estimated ejection fraction was 60%.  Right sided cardiac chambers were normal with no evidence of pulmonary hypertension.  Imaging of the septum showed no ASD or VSD Bubble study was negative for shunt 2D and color flow confirmed no PFO  The LA was well visualized in orthogonal views.  There was no spontaneous contrast and no thrombus in the LA and LA appendage   The descending thoracic aorta had no  mural aortic debris with no evidence of aneurysmal dilation or disection   Aaron Edelman Agbor-Etang 04/30/2019 8:46 AM

## 2019-04-30 NOTE — H&P (Signed)
H&P Addendum, pre-TEE  Patient was seen and evaluated prior to TEE procedure Symptoms, prior testing details again confirmed with the patient Patient examined, no significant change from prior exam Lab work reviewed in detail personally by myself Patient understands risk and benefit of the procedure, willing to proceed  Signed, Billal Rollo Agbor-Etang, M.D. [facility] 

## 2019-05-01 ENCOUNTER — Encounter: Payer: Self-pay | Admitting: Cardiology

## 2019-05-08 ENCOUNTER — Ambulatory Visit: Payer: BLUE CROSS/BLUE SHIELD | Admitting: Physical Medicine & Rehabilitation

## 2019-05-09 ENCOUNTER — Encounter: Payer: BLUE CROSS/BLUE SHIELD | Admitting: Psychology

## 2019-05-15 ENCOUNTER — Encounter: Payer: BLUE CROSS/BLUE SHIELD | Admitting: Physical Medicine & Rehabilitation

## 2019-05-19 ENCOUNTER — Telehealth: Payer: Self-pay | Admitting: Cardiology

## 2019-05-19 NOTE — Telephone Encounter (Signed)
Attempted to call the patient/ his wife, [name]. No answer- I left a message to please call back, we are trying to see if the patient has mailed his ZIO monitor back.  Per [employer], his monitor status is "pending return." Placed 04/25/19.

## 2019-05-20 NOTE — Telephone Encounter (Signed)
Attempted to call the patient. No answer- I left a message to please call back.  

## 2019-05-23 ENCOUNTER — Telehealth: Payer: Self-pay

## 2019-05-23 NOTE — Telephone Encounter (Signed)
Jeremy [name]'s wife (Melanie) stated there was a family emergency for why the Zio monitor has not been sent back yet. I instructed if she Melanie (wife) will place the monitor in the mail today with rescheduling until November 19th with Dr. Agbor-Etang. The wife is understanding and will have Zio placed in mail today.   

## 2019-05-23 NOTE — Telephone Encounter (Signed)
Jeremy [name]'s wife [name]) stated there was a family emergency for why the Zio monitor has not been sent back yet. I instructed if she [name] (wife) will place the monitor in the mail today with rescheduling until November 19th with Dr. Garen Lah. The wife is understanding and will have Zio placed in mail today.

## 2019-05-24 ENCOUNTER — Other Ambulatory Visit: Payer: Self-pay | Admitting: Physical Medicine & Rehabilitation

## 2019-05-26 ENCOUNTER — Ambulatory Visit: Payer: BLUE CROSS/BLUE SHIELD | Admitting: Cardiology

## 2019-06-05 ENCOUNTER — Ambulatory Visit (INDEPENDENT_AMBULATORY_CARE_PROVIDER_SITE_OTHER): Payer: BLUE CROSS/BLUE SHIELD | Admitting: Cardiology

## 2019-06-05 ENCOUNTER — Encounter
Payer: BLUE CROSS/BLUE SHIELD | Attending: Physical Medicine & Rehabilitation | Admitting: Physical Medicine & Rehabilitation

## 2019-06-05 ENCOUNTER — Other Ambulatory Visit: Payer: Self-pay

## 2019-06-05 ENCOUNTER — Encounter: Payer: Self-pay | Admitting: Cardiology

## 2019-06-05 ENCOUNTER — Encounter: Payer: Self-pay | Admitting: Physical Medicine & Rehabilitation

## 2019-06-05 VITALS — BP 136/87 | HR 95 | Temp 97.7°F | Ht 71.0 in | Wt 215.6 lb

## 2019-06-05 VITALS — BP 128/88 | HR 88 | Ht 71.0 in | Wt 214.2 lb

## 2019-06-05 DIAGNOSIS — I639 Cerebral infarction, unspecified: Secondary | ICD-10-CM | POA: Diagnosis not present

## 2019-06-05 DIAGNOSIS — I10 Essential (primary) hypertension: Secondary | ICD-10-CM | POA: Diagnosis not present

## 2019-06-05 DIAGNOSIS — I69351 Hemiplegia and hemiparesis following cerebral infarction affecting right dominant side: Secondary | ICD-10-CM | POA: Insufficient documentation

## 2019-06-05 DIAGNOSIS — Z8249 Family history of ischemic heart disease and other diseases of the circulatory system: Secondary | ICD-10-CM | POA: Insufficient documentation

## 2019-06-05 DIAGNOSIS — I82402 Acute embolism and thrombosis of unspecified deep veins of left lower extremity: Secondary | ICD-10-CM

## 2019-06-05 DIAGNOSIS — G4730 Sleep apnea, unspecified: Secondary | ICD-10-CM | POA: Diagnosis not present

## 2019-06-05 DIAGNOSIS — I69319 Unspecified symptoms and signs involving cognitive functions following cerebral infarction: Secondary | ICD-10-CM | POA: Insufficient documentation

## 2019-06-05 NOTE — Patient Instructions (Signed)
Medication Instructions:  None *If you need a refill on your cardiac medications before your next appointment, please call your pharmacy*  Lab Work: none If you have labs (blood work) drawn today and your tests are completely normal, you will receive your results only by: [facility] MyChart Message (if you have MyChart) OR . A paper copy in the mail If you have any lab test that is abnormal or we need to change your treatment, we will call you to review the results.  Testing/Procedures: None  Follow-Up: At [facility], you and your health needs are our priority.  As part of our continuing mission to provide you with exceptional heart care, we have created designated Provider Care Teams.  These Care Teams include your primary Cardiologist (physician) and Advanced Practice Providers (APPs -  Physician Assistants and Nurse Practitioners) who all work together to provide you with the care you need, when you need it.  Your next appointment:   As needed

## 2019-06-05 NOTE — Progress Notes (Signed)
Subjective:    Patient ID: Jeremy [name], male    DOB: 1976/07/09, 43 y.o.   MRN: [id number]  HPI  43 year old male with history of a large left MCA distribution infarct requiring craniotomy with removal of bone flap as well as reinsertion of the bone flap approximately 6 months later. He continues to exhibit a severe right upper extremity greater lower extremity weakness. Normal TEE Has external heart Monitor  No etiology determined yet for Left MCA infarct  Patient continues to ambulate with a quad cane, narrow based as well as a right double metal upright AFO.  He feels like the calf band is no longer fitting very well on his double metal upright AFO and does not reach the Velcro.  He has gained approximately 20 pounds over the last year.  Requires assistance donning his AFO and shoe but not doffing  He still complains of right ankle bouncing when he steps onto his foot without the AFO.  He gets up at night without the AFO to go to the bathroom. His right hand can be stretched fairly easily and he can trim his nails as well as wash his hands without difficulty.  Pain Inventory Average Pain 2 Pain Right Now 3 My pain is na  In the last 24 hours, has pain interfered with the following? General activity 10 Relation with others 10 Enjoyment of life 10 What TIME of day is your pain at its worst? daytime Sleep (in general) Good  Pain is worse with: walking, bending and standing Pain improves with: rest and heat/ice Relief from Meds: na  Mobility walk without assistance use a cane  Function disabled: date disabled .  Neuro/Psych spasms  Prior Studies Any changes since last visit?  no  Physicians involved in your care Any changes since last visit?  no   Family History  Problem Relation Age of Onset  . Hypertension Father   . Hypertension Brother    Social History   Socioeconomic History  . Marital status: Married    Spouse name: Shawna OrleansMelanie  . Number of children: 2   . Years of education: Not on file  . Highest education level: Not on file  Occupational History  . Not on file  Social Needs  . Financial resource strain: Not very hard  . Food insecurity    Worry: Never true    Inability: Never true  . Transportation needs    Medical: No    Non-medical: No  Tobacco Use  . Smoking status: Never Smoker  . Smokeless tobacco: Never Used  Substance and Sexual Activity  . Alcohol use: Not Currently    Comment: RARE  . Drug use: No  . Sexual activity: Not on file  Lifestyle  . Physical activity    Days per week: Not on file    Minutes per session: Not on file  . Stress: Not on file  Relationships  . Social connections    Talks on phone: More than three times a week    Gets together: Not on file    Attends religious service: Not on file    Active member of club or organization: Not on file    Attends meetings of clubs or organizations: Not on file    Relationship status: Not on file  Other Topics Concern  . Not on file  Social History Narrative  . Not on file   Past Surgical History:  Procedure Laterality Date  . CRANIOPLASTY N/A 03/19/2018   Procedure: CRANIOPLASTY;  Surgeon: Lisbeth Renshaw, MD;  Location: [facility] OR;  Service: Neurosurgery;  Laterality: N/A;  CRANIOPLASTY  . CRANIOTOMY Left 07/29/2017   Procedure: LEFT HEMI- CRANIECTOMY WITH PLACEMENT OF BONE FLAP IN ABDOMEN;  Surgeon: Lisbeth Renshaw, MD;  Location: [facility] OR;  Service: Neurosurgery;  Laterality: Left;  . IR ANGIO INTRA EXTRACRAN SEL COM CAROTID INNOMINATE UNI R MOD SED  07/28/2017  . IR ANGIO VERTEBRAL SEL VERTEBRAL UNI R MOD SED  07/28/2017  . IR CT HEAD LTD  07/28/2017  . IR PERCUTANEOUS ART THROMBECTOMY/INFUSION INTRACRANIAL INC DIAG ANGIO  07/28/2017  . NASAL SEPTOPLASTY W/ TURBINOPLASTY Bilateral 03/06/2017   Procedure: NASAL SEPTOPLASTY WITH TURBINATE REDUCTION;  Surgeon: Linus Salmons, MD;  Location: [facility] ORS;  Service: ENT;  Laterality: Bilateral;  . RADIOLOGY WITH  ANESTHESIA N/A 07/27/2017   Procedure: RADIOLOGY WITH ANESTHESIA;  Surgeon: Julieanne Cotton, MD;  Location: [facility] OR;  Service: Radiology;  Laterality: N/A;  . TEE WITHOUT CARDIOVERSION N/A 04/30/2019   Procedure: TRANSESOPHAGEAL ECHOCARDIOGRAM (TEE);  Surgeon: Debbe Odea, MD;  Location: [facility] ORS;  Service: Cardiovascular;  Laterality: N/A;   Past Medical History:  Diagnosis Date  . Asthma    MILD AS A CHILD  . Deviated septum   . Dyspnea    NORMAL SPIROMETRY 02/01/17 VISIT WITH DR HEDRICK  . Hypertension   . Sleep apnea   . Stroke (HCC)    BP 136/87   Pulse 95   Temp 97.7 F (36.5 C)   Ht 5\' 11"  (1.803 m)   Wt 215 lb 9.6 oz (97.8 kg)   SpO2 96%   BMI 30.07 kg/m   Opioid Risk Score:   Fall Risk Score:  `1  Depression screen PHQ 2/9  Depression screen [facility] 2/9 06/05/2019 09/24/2017  Decreased Interest 0 2  Down, Depressed, Hopeless 0 1  PHQ - 2 Score 0 3  Altered sleeping - 0  Tired, decreased energy - 2  Change in appetite - 2  Feeling bad or failure about yourself  - 1  Trouble concentrating - 0  Moving slowly or fidgety/restless - 0  Suicidal thoughts - 0  PHQ-9 Score - 8  Difficult doing work/chores - Somewhat difficult  Some recent data might be hidden    Review of Systems  Constitutional: Negative.   HENT: Negative.   Eyes: Negative.   Respiratory: Negative.   Cardiovascular: Negative.   Gastrointestinal: Negative.   Endocrine: Negative.   Genitourinary: Negative.   Musculoskeletal: Positive for gait problem.       Spasms  Skin: Negative.   Allergic/Immunologic: Negative.   Neurological: Positive for weakness.  Hematological: Bruises/bleeds easily.       Eliquis  Psychiatric/Behavioral: Negative.   All other systems reviewed and are negative.      Objective:   Physical Exam Vitals signs and nursing note reviewed.  Constitutional:      Appearance: Normal appearance.  Eyes:     Extraocular Movements: Extraocular movements intact.      Conjunctiva/sclera: Conjunctivae normal.     Pupils: Pupils are equal, round, and reactive to light.  Skin:    General: Skin is warm and dry.  Neurological:     General: No focal deficit present.     Mental Status: He is alert and oriented to person, place, and time.  Psychiatric:        Attention and Perception: He is inattentive.        Mood and Affect: Mood normal. Affect is flat.  Behavior: Behavior is withdrawn.   Motor strength is 0/5 in the right deltoid, bicep, tricep, grip 5/5 in the left deltoid, bicep, tricep, grip 3 - at the right hip flexor 4 at the knee extensor 0 at the ankle plantar flexor and dorsiflexor Left lower extremity 5/5 Tone MAS 2 at the finger flexors and thumb flexor on the right MAS 1 at the wrist flexor MAS 1 at the elbow flexor MAS 3 at the plantar flexors of the right foot.  There is sustained clonus in the right ankle. Patient ambulates with AFO and quad cane, some circumduction noted.  No knee instability although a tendency towards hyperextension        Assessment & Plan:  #1.  Right spastic hemiplegia secondary to left MCA distribution infarct, cryptogenic. Upper extremity spasticity is fairly well controlled with stretching. Right lower extremity spasticity with clonus good candidate for phenol injection.  He did not get great relief with botulinum toxin injection to the lower extremity, did get relief with phenol neurolysis of the right tibial nerve performed in January 2020 we will schedule for repeat  Also have written prescription for enlargement of calf cuff for his right double metal upright AFO

## 2019-06-05 NOTE — Patient Instructions (Addendum)
I have a written prescription for [employer] to modify the calf strap to your Right AFO

## 2019-06-05 NOTE — Progress Notes (Signed)
Cardiology Office Note:    Date:  06/05/2019   ID:  Jeremy [name], DOB 01-06-1976, MRN [id number]  PCP:  Maryland Pink, MD  Cardiologist:  Kate Sable, MD  Electrophysiologist:  None   Referring MD: Maryland Pink, MD   Chief Complaint  Patient presents with  . OTHER    F/u TEE and zio monitor results being printed. Meds reviewed ver    History of Present Illness:    Jeremy [name] is a 43 y.o. male with a hx of hypertension, recent CVA who presents for follow-up.    Patient originally seen due to some swelling in his legs.  Patient with history of stroke, January 2019, when he was at home and was noted to fall out of his bed.  He was found to have right facial droop and slurred speech.  He was brought to the emergency room to [facility] in [place] by EMS.  He received TPA, after head CT did not reveal any bleeding.  CTA did not show acute infarct.  Subsequently underwent thrombectomy.  Etiology of his stroke remains unclear as TEE attempted in the hospital was unsuccessful.  He was subsequently diagnosed with DVT in the left common femoral vein and started on Eliquis as of March 2019.  Patient is being followed by neurology and evaluation demonstrate immobility of his right leg and right arm.  He was deemed high risk for recurrent DVTs and the neurology service recommended continued Eliquis unless mobility improves.  Denies any history of palpitations, smoking.  He states having normal speech, able to swallow.  He was seen in the clinic and at the time Zio patch was placed to monitor for any evidence of atrial fibrillation.  He now presents for results.  TEE performed in the hospital did not reveal any PFO.   Past Medical History:  Diagnosis Date  . Asthma    MILD AS A CHILD  . Deviated septum   . Dyspnea    NORMAL SPIROMETRY 02/01/17 VISIT WITH DR [place]  . Hypertension   . Sleep apnea   . Stroke [facility])     Past Surgical History:  Procedure Laterality Date  . CRANIOPLASTY  N/A 03/19/2018   Procedure: CRANIOPLASTY;  Surgeon: Consuella Lose, MD;  Location: [place];  Service: Neurosurgery;  Laterality: N/A;  CRANIOPLASTY  . CRANIOTOMY Left 07/29/2017   Procedure: LEFT HEMI- CRANIECTOMY WITH PLACEMENT OF BONE FLAP IN ABDOMEN;  Surgeon: Consuella Lose, MD;  Location: [place];  Service: Neurosurgery;  Laterality: Left;  . IR ANGIO INTRA EXTRACRAN SEL COM CAROTID INNOMINATE UNI R MOD SED  07/28/2017  . IR ANGIO VERTEBRAL SEL VERTEBRAL UNI R MOD SED  07/28/2017  . IR CT HEAD LTD  07/28/2017  . IR PERCUTANEOUS ART THROMBECTOMY/INFUSION INTRACRANIAL INC DIAG ANGIO  07/28/2017  . NASAL SEPTOPLASTY W/ TURBINOPLASTY Bilateral 03/06/2017   Procedure: NASAL SEPTOPLASTY WITH TURBINATE REDUCTION;  Surgeon: Beverly Gust, MD;  Location: [facility] ORS;  Service: ENT;  Laterality: Bilateral;  . RADIOLOGY WITH ANESTHESIA N/A 07/27/2017   Procedure: RADIOLOGY WITH ANESTHESIA;  Surgeon: Luanne Bras, MD;  Location: [place];  Service: Radiology;  Laterality: N/A;  . TEE WITHOUT CARDIOVERSION N/A 04/30/2019   Procedure: TRANSESOPHAGEAL ECHOCARDIOGRAM (TEE);  Surgeon: Kate Sable, MD;  Location: [facility] ORS;  Service: Cardiovascular;  Laterality: N/A;    Current Medications: Current Meds  Medication Sig  . amLODipine (NORVASC) 10 MG tablet Take 10 mg by mouth daily.  Marland Kitchen ELIQUIS 5 MG TABS tablet Take 5 mg by mouth  2 (two) times daily.   Marland Kitchen ezetimibe (ZETIA) 10 MG tablet Take 10 mg by mouth daily.   Marland Kitchen gabapentin (NEURONTIN) 400 MG capsule TAKE ONE CAPSULE BY MOUTH THREE TIMES A DAY  . loratadine (CLARITIN) 10 MG tablet Take 10 mg by mouth daily.  . rosuvastatin (CRESTOR) 10 MG tablet Take 10 mg by mouth daily.  . sildenafil (VIAGRA) 50 MG tablet Take by mouth as needed.  Marland Kitchen spironolactone (ALDACTONE) 100 MG tablet Take 100 mg by mouth daily.     Allergies:   Blood-group specific substance and Lipitor [atorvastatin calcium]   Social History   Socioeconomic History  . Marital  status: Married    Spouse name: [name]  . Number of children: 2  . Years of education: Not on file  . Highest education level: Not on file  Occupational History  . Not on file  Social Needs  . Financial resource strain: Not very hard  . Food insecurity    Worry: Never true    Inability: Never true  . Transportation needs    Medical: No    Non-medical: No  Tobacco Use  . Smoking status: Never Smoker  . Smokeless tobacco: Never Used  Substance and Sexual Activity  . Alcohol use: Not Currently    Comment: RARE  . Drug use: No  . Sexual activity: Not on file  Lifestyle  . Physical activity    Days per week: Not on file    Minutes per session: Not on file  . Stress: Not on file  Relationships  . Social connections    Talks on phone: More than three times a week    Gets together: Not on file    Attends religious service: Not on file    Active member of club or organization: Not on file    Attends meetings of clubs or organizations: Not on file    Relationship status: Not on file  Other Topics Concern  . Not on file  Social History Narrative  . Not on file     Family History: The patient's family history includes Hypertension in his brother and father.  ROS:   Please see the history of present illness.     All other systems reviewed and are negative.  EKGs/Labs/Other Studies Reviewed:    The following studies were reviewed today:  TEE 04/30/2019  1. Left ventricular ejection fraction, by visual estimation, is 60 to 65%. The left ventricle has normal function. Normal left ventricular size. There is no left ventricular hypertrophy.  2. Left ventricular diastolic function could not be evaluated pattern of LV diastolic filling.  3. Global right ventricle has normal systolic function.The right ventricular size is normal. Right vetricular wall thickness was not assessed.  4. Left atrial size was normal.  5. Right atrial size was normal.  6. The mitral valve is normal in  structure. No evidence of mitral valve regurgitation.  7. The tricuspid valve is normal in structure. Tricuspid valve regurgitation was not visualized by color flow Doppler.  8. The aortic valve is tricuspid Aortic valve regurgitation was not visualized by color flow Doppler.  9. The pulmonic valve was not well visualized. Pulmonic valve regurgitation is not visualized by color flow Doppler.    Ultrasound venous date 10/05/2017 IMPRESSION: Examination is positive for extensive, predominantly occlusive, DVT extending from the left common femoral vein through the left tibial veins  Ultrasound venous date 10/18/2018 Summary: Left: Findings consistent with chronic deep vein thrombosis involving the left common  femoral vein, left femoral vein, and left popliteal vein. The profunda vein is patent. There is spontaneous flow in the IVC and right common iliac vein, external iliac vein. The left common iliac vein appears patent proximally with chronic occlusion distally and in the external iliac vein.   Echo 07/29/2017 Study Conclusions  - Left ventricle: The cavity size was normal. Wall thickness was   increased in a pattern of mild LVH. Systolic function was normal.    The estimated ejection fraction was in the range of 60% to 65%.   Wall motion was normal; there were no regional wall motion   abnormalities. Left ventricular diastolic function parameters   were normal. - Atrial septum: No defect or patent foramen ovale was identified.   EKG:  EKG is  ordered today.  The ekg ordered today demonstrates sinus rhythm, normal ECG.  Recent Labs: No results found for requested labs within last 8760 hours.  Recent Lipid Panel    Component Value Date/Time   CHOL 218 (H) 07/28/2017 0259   TRIG 316 (H) 07/30/2017 2323   HDL 28 (L) 07/28/2017 0259   CHOLHDL 7.8 07/28/2017 0259   VLDL UNABLE TO CALCULATE IF TRIGLYCERIDE OVER 400 mg/dL 16/04/9603 5409   LDLCALC UNABLE TO CALCULATE IF TRIGLYCERIDE  OVER 400 mg/dL 81/19/1478 2956    Physical Exam:    VS:  BP 128/88 (BP Location: Left Arm, Patient Position: Sitting, Cuff Size: Normal)   Pulse 88   Ht  (1.803 m)   Wt 214 lb 4 oz (97.2 kg)   SpO2 98%   BMI 29.88 kg/m     Wt Readings from Last 3 Encounters:  06/05/19 214 lb 4 oz (97.2 kg)  04/30/19 212 lb 1.3 oz (96.2 kg)  04/25/19 212 lb (96.2 kg)     GEN:  Well nourished, well developed in no acute distress, patient sitting in wheelchair. HEENT: Normal NECK: No JVD; No carotid bruits LYMPHATICS: No lymphadenopathy CARDIAC: RRR, no murmurs, rubs, gallops RESPIRATORY:  Clear to auscultation without rales, wheezing or rhonchi  ABDOMEN: Soft, non-tender, non-distended MUSCULOSKELETAL: Right arm weakness noted, right leg in brace. SKIN: Warm and dry NEUROLOGIC:  Alert and oriented x 3 PSYCHIATRIC:  Normal affect   ASSESSMENT:   Patient with history of stroke and DVT.  Not sure of etiology.  No evidence of PFO or ASD present on TEE.  2-week cardiac monitor did not reveal any evidence for atrial fibrillation or other arrhythmias.  Predominant underlying rhythm was sinus. 1. Essential hypertension   2. Cryptogenic stroke (HCC)   3. Deep vein thrombosis (DVT) of left lower extremity, unspecified chronicity, unspecified vein (HCC)    PLAN:    In order of problems listed above:  1. Blood pressure well controlled.  Continue Norvasc. 2. No evidence of PFO noted on TEE.  No evidence of A. fib/a flutter on cardiac monitor.  Patient made aware of results. 3. Continue Eliquis due to history of DVT.  Follow-up as needed.   Medication Adjustments/Labs and Tests Ordered: Current medicines are reviewed at length with the patient today.  Concerns regarding medicines are outlined above.  Orders Placed This Encounter  Procedures  . EKG 12-Lead   No orders of the defined types were placed in this encounter.   Patient Instructions  Medication Instructions:  None *If you  need a refill on your cardiac medications before your next appointment, please call your pharmacy*  Lab Work: none If you have labs (blood work) drawn today and  your tests are completely normal, you will receive your results only by: Marland Kitchen. MyChart Message (if you have MyChart) OR . A paper copy in the mail If you have any lab test that is abnormal or we need to change your treatment, we will call you to review the results.  Testing/Procedures: None  Follow-Up: At [facility], you and your health needs are our priority.  As part of our continuing mission to provide you with exceptional heart care, we have created designated Provider Care Teams.  These Care Teams include your primary Cardiologist (physician) and Advanced Practice Providers (APPs -  Physician Assistants and Nurse Practitioners) who all work together to provide you with the care you need, when you need it.  Your next appointment:   As needed     Signed, Debbe OdeaBrian Agbor-Etang, MD  06/05/2019 11:07 AM    [facility]

## 2019-06-26 ENCOUNTER — Other Ambulatory Visit: Payer: Self-pay

## 2019-06-26 ENCOUNTER — Encounter: Payer: Self-pay | Admitting: Physical Medicine & Rehabilitation

## 2019-06-26 ENCOUNTER — Encounter
Payer: BLUE CROSS/BLUE SHIELD | Attending: Physical Medicine & Rehabilitation | Admitting: Physical Medicine & Rehabilitation

## 2019-06-26 VITALS — BP 141/94 | HR 99 | Temp 97.7°F | Ht 71.0 in | Wt 215.0 lb

## 2019-06-26 DIAGNOSIS — G8110 Spastic hemiplegia affecting unspecified side: Secondary | ICD-10-CM | POA: Diagnosis not present

## 2019-06-26 DIAGNOSIS — I69319 Unspecified symptoms and signs involving cognitive functions following cerebral infarction: Secondary | ICD-10-CM | POA: Insufficient documentation

## 2019-06-26 DIAGNOSIS — Z8249 Family history of ischemic heart disease and other diseases of the circulatory system: Secondary | ICD-10-CM | POA: Insufficient documentation

## 2019-06-26 DIAGNOSIS — G4730 Sleep apnea, unspecified: Secondary | ICD-10-CM | POA: Insufficient documentation

## 2019-06-26 DIAGNOSIS — I10 Essential (primary) hypertension: Secondary | ICD-10-CM | POA: Insufficient documentation

## 2019-06-26 DIAGNOSIS — I69351 Hemiplegia and hemiparesis following cerebral infarction affecting right dominant side: Secondary | ICD-10-CM | POA: Insufficient documentation

## 2019-06-26 NOTE — Progress Notes (Signed)
Phenol neurolysis of the RIGHT tibial nerve  Indication: Severe spasticity in the plantar flexor muscles which is not responding to medical management and other conservative care and interfering with functional use.  Informed consent was obtained after describing the risks and benefits of the procedure with the patient this includes bleeding bruising and infection as well as medication side effects. The patient elected to proceed and has given written consent. Patient placed in a prone position on the exam table. External DC stimulation was applied to the popliteal space using a nerve stimulator. Plantar flexion twitch was obtained. The popliteal region was prepped with Betadine and then entered with a 22-gauge 40 mm needle electrode under electrical stimulation guidance. Plantar flexion which was obtained and confirmed. Then 4 cc of 5% phenol were injected. The patient tolerated procedure well. Post procedure instructions and followup visit were given.

## 2019-06-26 NOTE — Patient Instructions (Signed)
Tibial nerve block with phenol today. This medication may start taking the fact today however full effect will be at about one week Duration of the effect is 3-6 months Side effects of medication may include right heel numbness or burning. Call if you have burning pain so we can recommend any medication for that. 

## 2019-07-07 ENCOUNTER — Other Ambulatory Visit: Payer: Self-pay

## 2019-07-07 ENCOUNTER — Encounter: Payer: BLUE CROSS/BLUE SHIELD | Admitting: Psychology

## 2019-07-07 DIAGNOSIS — I69319 Unspecified symptoms and signs involving cognitive functions following cerebral infarction: Secondary | ICD-10-CM | POA: Diagnosis not present

## 2019-07-07 DIAGNOSIS — I69314 Frontal lobe and executive function deficit following cerebral infarction: Secondary | ICD-10-CM

## 2019-07-07 DIAGNOSIS — I10 Essential (primary) hypertension: Secondary | ICD-10-CM | POA: Diagnosis not present

## 2019-07-07 DIAGNOSIS — G4730 Sleep apnea, unspecified: Secondary | ICD-10-CM | POA: Diagnosis not present

## 2019-07-07 DIAGNOSIS — I69351 Hemiplegia and hemiparesis following cerebral infarction affecting right dominant side: Secondary | ICD-10-CM | POA: Diagnosis not present

## 2019-07-07 DIAGNOSIS — G8110 Spastic hemiplegia affecting unspecified side: Secondary | ICD-10-CM | POA: Diagnosis not present

## 2019-07-07 DIAGNOSIS — F4323 Adjustment disorder with mixed anxiety and depressed mood: Secondary | ICD-10-CM | POA: Diagnosis not present

## 2019-07-07 DIAGNOSIS — Z8249 Family history of ischemic heart disease and other diseases of the circulatory system: Secondary | ICD-10-CM | POA: Diagnosis not present

## 2019-07-14 ENCOUNTER — Other Ambulatory Visit: Payer: Self-pay | Admitting: Physical Medicine & Rehabilitation

## 2019-07-17 ENCOUNTER — Encounter: Payer: Self-pay | Admitting: Psychology

## 2019-07-17 NOTE — Progress Notes (Signed)
Neuropsychology Visit  Patient:  Jeremy [name]   DOB: Feb 07, 1976  MR Number: [id number]  Location: [facility] FOR PAIN AND REHABILITATIVE MEDICINE [facility] PHYSICAL MEDICINE AND REHABILITATION [address] [place], [address] [id number] [facility] [place] [address] Dept: [phone]  Date of Service: 07/07/2019    Start: 4 PM End: 5 PM  Duration of Service: 1 Hour Today's visit was an in person visit that was conducted with the patient his wife and myself in my outpatient clinic office.  Provider/Observer:     Hershal Coria PsyD  Chief Complaint:      Chief Complaint  Patient presents with  . Anxiety  . Depression  . Other    Late effects of CVA    Reason For Service:     Jeremy [name] is a 43 year old male referred by Dr. Wynn Banker for neuropsychological consultation.  The patient was admitted to the hospital after he presented on 07/27/2006 with right-sided weakness, headache, facial droop and slurred speech.  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  There was also cerebral edema with midline shift visualized.  A decompressive hemicraniotomy was performed.  The patient had significant aphasia/expressive language symptoms and near complete right arm and right leg motor impairments.  There was a great deal of impulse control that was almost completely verbal in nature initially.  The patient was initially verbally resistant and negative interactions during some therapy sessions were noted.  Receptive language functions did appear to be intact.  There was apparent significant depression and anxiety symptoms due to sudden loss of function.  The patient was treated in the comprehensive inpatient rehabilitation program.  During that time I had the opportunity to work with the patient's 3 times that this is a follow-up from that 2019.    The above reason for service has been reviewed for this appointment remains applicable for the  current visit.  The patient continues to have significant motivation issues but they are improving overall.  The patient's wife and the patient both report that he has been doing more activities around the house but it is still difficult for them to connect and I personally with the changes that have happened with the husband passed his cerebrovascular accident.  Treatment Interventions:  Cognitive/behavioral therapeutic intervention adjustment issues as well as working on Pharmacologist and strategies for dealing with residual frontal lobe cognitive deficits  Participation Level:   Active  Participation Quality:  Appropriate and Redirectable      Behavioral Observation:  Well Groomed, Alert, and Appropriate.   Current Psychosocial Factors: The patient's wife reports that she has seen some significant improvements in his degree of activity and engagement around the house.  There is still some issues related to how they are able to connect and relate interpersonally but he is doing much more around the house and is following some of the treatment interventions we have developed.  Content of Session:    Current symptoms and continue to work on therapeutic interventions.  Effectiveness of Interventions: The patient was more engaging and involved today that he has been in the past.  While it was difficult to get extensive responses from him he did initiate several questions today and this was the first time that he actively initiated responses versus being asked questions and him given simple one-word answers.  Target Goals:   Target goals include improving coping with his significant loss of function and working on executive functioning's.  Goals  Last Reviewed:   07/07/2019  Goals Addressed Today:    Goals today include working on therapeutic interventions for self initiation of behaviors.  Impression/Diagnosis:   Jeremy [name] is a 43 year old male referred by Dr. Letta Pate for neuropsychological  consultation.  The patient was admitted to the hospital after he presented on 07/27/2006 with right-sided weakness, headache, facial droop and slurred speech.  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  There was also cerebral edema with midline shift visualized.  A decompressive hemicraniotomy was performed.  The patient had significant aphasia/expressive language symptoms and near complete right arm and right leg motor impairments.  There was a great deal of impulse control that was almost completely verbal in nature initially.  The patient was initially verbally resistant and negative interactions during some therapy sessions were noted.  Receptive language functions did appear to be intact.  There was apparent significant depression and anxiety symptoms due to sudden loss of function.  The patient was treated in the comprehensive inpatient rehabilitation program.  During that time I had the opportunity to work with the patient's 3 times that this is a follow-up from that 2019.  The patient was referred for psychotherapeutic/neuropsychological consultation in consulting.  The patient is described as continuing to have significant anxiety and have some significant lack of motivation and desire.  The patient's family deny that it appears to be significant depression but simply no desire internal self initiation.  The patient is described as being completely fine sitting on the sofa and letting his wife do everything.  The patient continues to be rather resistant and denying of some of the level of deficits that he has.  He continues to have significant motor deficits in his right arm but less so in his right leg but also has some paralysis in his right leg.  The patient is described as having significant executive functioning deficits.  The patient was treated through [facility] system starting July 27, 2017 through September 07, 2017.  The patient  initially started inpatient rehab while in the hospital and continues with in-home rehab then graduating to outpatient rehab.  The patient is described as sleeping fine and having good appetite.  There are some memory deficits noted primarily as a delay with short-term memory recall.  The patient's wife is his primary caretaker.   The patient is described as having significant self initiation deficits along with paralysis on the right side of his body.  The patient is rather resistant to any efforts to try to get him to engage or do things although he is assisting on going back to work even though he has a significant deficits.  The patient has significant neuropsychological and cognitive deficits that continue.  The patient has significant executive functioning deficits along with his severe motor deficits.  The patient has almost no self initiation of behaviors unless he is very agitated emotionally.  The patient is currently disabled and would not be able to perform his ongoing job duties.     Ilean Skill, Psy.D. Clinical Psychologist Neuropsychologist

## 2019-08-04 ENCOUNTER — Encounter: Payer: 59 | Admitting: Psychology

## 2019-08-07 ENCOUNTER — Encounter: Payer: 59 | Admitting: Physical Medicine & Rehabilitation

## 2019-08-08 ENCOUNTER — Encounter: Payer: 59 | Attending: Physical Medicine & Rehabilitation | Admitting: Physical Medicine & Rehabilitation

## 2019-08-08 ENCOUNTER — Encounter: Payer: Self-pay | Admitting: Physical Medicine & Rehabilitation

## 2019-08-08 ENCOUNTER — Other Ambulatory Visit: Payer: Self-pay

## 2019-08-08 VITALS — BP 135/86 | HR 94 | Temp 97.9°F | Ht 71.0 in | Wt 212.0 lb

## 2019-08-08 DIAGNOSIS — Z8249 Family history of ischemic heart disease and other diseases of the circulatory system: Secondary | ICD-10-CM | POA: Diagnosis not present

## 2019-08-08 DIAGNOSIS — G8110 Spastic hemiplegia affecting unspecified side: Secondary | ICD-10-CM

## 2019-08-08 DIAGNOSIS — I69319 Unspecified symptoms and signs involving cognitive functions following cerebral infarction: Secondary | ICD-10-CM | POA: Insufficient documentation

## 2019-08-08 DIAGNOSIS — I69351 Hemiplegia and hemiparesis following cerebral infarction affecting right dominant side: Secondary | ICD-10-CM | POA: Diagnosis not present

## 2019-08-08 DIAGNOSIS — F4323 Adjustment disorder with mixed anxiety and depressed mood: Secondary | ICD-10-CM | POA: Diagnosis not present

## 2019-08-08 DIAGNOSIS — G4730 Sleep apnea, unspecified: Secondary | ICD-10-CM | POA: Diagnosis not present

## 2019-08-08 DIAGNOSIS — I10 Essential (primary) hypertension: Secondary | ICD-10-CM | POA: Diagnosis not present

## 2019-08-08 NOTE — Progress Notes (Signed)
Subjective:    Patient ID: Jeremy [name], male    DOB: Jul 17, 1976, 44 y.o.   MRN: [id number]  HPI 44 year old male with history of hypertension who had a left MCA distribution infarct with significant mass-effect that required a left decompressive hemicraniectomy on July 29, 2017.  He was an inpatient on the rehabilitation unit from 08/07/2017 to 09/07/2017. Patient has a history of DVT on Eliquis. Patient has had chronic spasticity but did not have much improvement with his right upper extremity spasticity after Botox injection.  The patient had good improvements with right foot inversion and toe curling following phenol neurolysis of the right tibial nerve last performed approximately 6 weeks ago.  Patient is having some issues with his double metal upright AFO.  He did get a new calf cough due to weight gain. Patient states that he was told part of the orthosis is cracked.  He follows up at [facility] orthotics for this Pain Inventory Average Pain 5 Pain Right Now 6 My pain is intermittent, constant, sharp and aching  In the last 24 hours, has pain interfered with the following? General activity 5 Relation with others 5 Enjoyment of life 5 What TIME of day is your pain at its worst? evening Sleep (in general) Good  Pain is worse with: walking, bending, standing and some activites Pain improves with: rest, heat/ice and medication Relief from Meds: 3  Mobility use a cane ability to climb steps?  yes  Function disabled: date disabled .  Neuro/Psych numbness tremor tingling spasms  Prior Studies Any changes since last visit?  no  Physicians involved in your care Any changes since last visit?  no   Family History  Problem Relation Age of Onset  . Hypertension Father   . Hypertension Brother    Social History   Socioeconomic History  . Marital status: Married    Spouse name: [name]  . Number of children: 2  . Years of education: Not on file  . Highest education  level: Not on file  Occupational History  . Not on file  Tobacco Use  . Smoking status: Never Smoker  . Smokeless tobacco: Never Used  Substance and Sexual Activity  . Alcohol use: Not Currently    Comment: RARE  . Drug use: No  . Sexual activity: Not on file  Other Topics Concern  . Not on file  Social History Narrative  . Not on file   Social Determinants of Health   Financial Resource Strain: Low Risk   . Difficulty of Paying Living Expenses: Not very hard  Food Insecurity: No Food Insecurity  . Worried About Programme researcher, broadcasting/film/video in the Last Year: Never true  . Ran Out of Food in the Last Year: Never true  Transportation Needs: No Transportation Needs  . Lack of Transportation (Medical): No  . Lack of Transportation (Non-Medical): No  Physical Activity:   . Days of Exercise per Week: Not on file  . Minutes of Exercise per Session: Not on file  Stress:   . Feeling of Stress : Not on file  Social Connections: Unknown  . Frequency of Communication with Friends and Family: More than three times a week  . Frequency of Social Gatherings with Friends and Family: Not on file  . Attends Religious Services: Not on file  . Active Member of Clubs or Organizations: Not on file  . Attends Banker Meetings: Not on file  . Marital Status: Not on file   Past Surgical  History:  Procedure Laterality Date  . CRANIOPLASTY N/A 03/19/2018   Procedure: CRANIOPLASTY;  Surgeon: Lisbeth Renshaw, MD;  Location: [facility] OR;  Service: Neurosurgery;  Laterality: N/A;  CRANIOPLASTY  . CRANIOTOMY Left 07/29/2017   Procedure: LEFT HEMI- CRANIECTOMY WITH PLACEMENT OF BONE FLAP IN ABDOMEN;  Surgeon: Lisbeth Renshaw, MD;  Location: [facility] OR;  Service: Neurosurgery;  Laterality: Left;  . IR ANGIO INTRA EXTRACRAN SEL COM CAROTID INNOMINATE UNI R MOD SED  07/28/2017  . IR ANGIO VERTEBRAL SEL VERTEBRAL UNI R MOD SED  07/28/2017  . IR CT HEAD LTD  07/28/2017  . IR PERCUTANEOUS ART THROMBECTOMY/INFUSION  INTRACRANIAL INC DIAG ANGIO  07/28/2017  . NASAL SEPTOPLASTY W/ TURBINOPLASTY Bilateral 03/06/2017   Procedure: NASAL SEPTOPLASTY WITH TURBINATE REDUCTION;  Surgeon: Linus Salmons, MD;  Location: [facility] ORS;  Service: ENT;  Laterality: Bilateral;  . RADIOLOGY WITH ANESTHESIA N/A 07/27/2017   Procedure: RADIOLOGY WITH ANESTHESIA;  Surgeon: Julieanne Cotton, MD;  Location: [facility] OR;  Service: Radiology;  Laterality: N/A;  . TEE WITHOUT CARDIOVERSION N/A 04/30/2019   Procedure: TRANSESOPHAGEAL ECHOCARDIOGRAM (TEE);  Surgeon: Debbe Odea, MD;  Location: [facility] ORS;  Service: Cardiovascular;  Laterality: N/A;   Past Medical History:  Diagnosis Date  . Asthma    MILD AS A CHILD  . Deviated septum   . Dyspnea    NORMAL SPIROMETRY 02/01/17 VISIT WITH DR HEDRICK  . Hypertension   . Sleep apnea   . Stroke (HCC)    BP 135/86   Pulse 94   Temp 97.9 F (36.6 C)   Ht 5\' 11"  (1.803 m)   Wt 212 lb (96.2 kg)   SpO2 95%   BMI 29.57 kg/m   Opioid Risk Score:   Fall Risk Score:  `1  Depression screen PHQ 2/9  Depression screen [facility] 2/9 06/05/2019 09/24/2017  Decreased Interest 0 2  Down, Depressed, Hopeless 0 1  PHQ - 2 Score 0 3  Altered sleeping - 0  Tired, decreased energy - 2  Change in appetite - 2  Feeling bad or failure about yourself  - 1  Trouble concentrating - 0  Moving slowly or fidgety/restless - 0  Suicidal thoughts - 0  PHQ-9 Score - 8  Difficult doing work/chores - Somewhat difficult  Some recent data might be hidden     Review of Systems  Constitutional: Negative.   HENT: Negative.   Eyes: Negative.   Respiratory: Negative.   Cardiovascular: Negative.   Gastrointestinal: Negative.   Endocrine: Negative.   Genitourinary: Negative.   Musculoskeletal: Positive for myalgias.  Skin: Negative.   Allergic/Immunologic: Negative.   Neurological: Positive for tremors and numbness.  Hematological: Negative.   Psychiatric/Behavioral: Negative.   All other systems  reviewed and are negative.      Objective:   Physical Exam Vitals and nursing note reviewed.  Constitutional:      Appearance: Normal appearance.  Eyes:     Extraocular Movements: Extraocular movements intact.     Conjunctiva/sclera: Conjunctivae normal.     Pupils: Pupils are equal, round, and reactive to light.  Neurological:     Mental Status: He is alert.     Cranial Nerves: No cranial nerve deficit or dysarthria.     Coordination: Coordination abnormal.     Gait: Gait abnormal.     Comments: Ambulates with the AFO he does have hyperextension at the knee.  Psychiatric:        Mood and Affect: Mood normal.     Right foot without  any swelling no hypersensitivity to touch.  He has trace ankle plantar flexion 0 ankle dorsiflexion 0 foot inversion eversion or toe flexion extension. There is sustained clonus at the right ankle. His right lower extremity strength is 3 - at the hip flexors 4 at the knee extensors and 4 - at the knee flexors.  Right upper extremity strength is essentially 0/5 in the deltoid, biceps, triceps, grip  Mood and affect are flat Speech without evidence of dysarthria Sentence level speech       Assessment & Plan:  #1.  Left MCA distribution infarct with residual right spastic hemiparesis.  He has severe weakness of the right upper extremity with very little return. His right lower extremity has good proximal strength. His spasticity in the foot inverters toe flexors and plantar flexors has improved following the phenol neurolysis performed approximately 6 weeks ago.  He is now able ambulate without the brace short household distances such as getting up to go to the bathroom at night. We discussed that the average duration of the phenol neurolysis is approximately 6 months.  It can be repeated as early as 3 months but we will schedule for 75-month follow-up. Terms of his orthosis I think the double metal upright design is appropriate.  He may require some  adjustment of his plantarflexion stop if he can tolerate this from a quad strength standpoint. He will follow-up with orthotist on this.  #2.  Cognitive deficits post stroke he follows up with neuropsychology overall has made good progress with this over the course of the last year

## 2019-08-08 NOTE — Patient Instructions (Signed)
Will repeat Phenol injection 6 months.  Please call if you thionk you may need it sooner,  We have to wait at least 10months

## 2019-08-11 DIAGNOSIS — Z0271 Encounter for disability determination: Secondary | ICD-10-CM

## 2019-09-01 ENCOUNTER — Encounter: Payer: Self-pay | Admitting: Psychology

## 2019-09-01 ENCOUNTER — Encounter: Payer: 59 | Attending: Physical Medicine & Rehabilitation | Admitting: Psychology

## 2019-09-01 ENCOUNTER — Other Ambulatory Visit: Payer: Self-pay

## 2019-09-01 DIAGNOSIS — F4323 Adjustment disorder with mixed anxiety and depressed mood: Secondary | ICD-10-CM

## 2019-09-01 DIAGNOSIS — I69351 Hemiplegia and hemiparesis following cerebral infarction affecting right dominant side: Secondary | ICD-10-CM | POA: Diagnosis not present

## 2019-09-01 DIAGNOSIS — Z8249 Family history of ischemic heart disease and other diseases of the circulatory system: Secondary | ICD-10-CM | POA: Diagnosis not present

## 2019-09-01 DIAGNOSIS — I69314 Frontal lobe and executive function deficit following cerebral infarction: Secondary | ICD-10-CM

## 2019-09-01 DIAGNOSIS — I10 Essential (primary) hypertension: Secondary | ICD-10-CM | POA: Diagnosis not present

## 2019-09-01 DIAGNOSIS — I69319 Unspecified symptoms and signs involving cognitive functions following cerebral infarction: Secondary | ICD-10-CM | POA: Insufficient documentation

## 2019-09-01 DIAGNOSIS — G4730 Sleep apnea, unspecified: Secondary | ICD-10-CM | POA: Diagnosis not present

## 2019-09-01 DIAGNOSIS — G8110 Spastic hemiplegia affecting unspecified side: Secondary | ICD-10-CM

## 2019-09-01 NOTE — Progress Notes (Signed)
Neuropsychology Visit  Patient:  Jeremy [name]   DOB: September 26, 1975  MR Number: [id number]  Location: [name] PHYSICAL MEDICINE AND REHABILITATION [address] [place], [phone] [name] [place] [address] Dept: [phone]  Date of Service: 09/01/2019   Start: 1:15 PM End: 2 PM  Duration of Service: 1 Hour Today's visit was an in person visit that was conducted with the patient and myself in my outpatient clinic office.  His wife stayed in the car during this visit.  Provider/Observer:     Jeremy Coria PsyD  Chief Complaint:      Chief Complaint  Patient presents with  . Anxiety  . Depression  . Cerebrovascular Accident    Reason For Service:     Jeremy [name] is a 44 year old male referred by Dr. Wynn [name] for neuropsychological consultation.  The [facility] was [facility] to the [name] after he presented on 07/27/2006 with right-sided weakness, headache, facial droop and slurred speech.  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  There was also cerebral edema with midline shift visualized.  A decompressive hemicraniotomy was performed.  The patient had significant aphasia/expressive language symptoms and near complete right arm and right leg motor impairments.  There was a great deal of impulse control that was almost completely verbal in nature initially.  The patient was initially verbally resistant and negative interactions during some therapy sessions were noted.  Receptive language functions did appear to be intact.  There was apparent significant depression and anxiety symptoms due to sudden loss of function.  The patient was treated in the comprehensive inpatient rehabilitation program.  During that time I had the opportunity to work with the patient's 3 times that this is a follow-up from that 2019.    The above reason for service has been reviewed for this appointment  remains applicable for the current visit.  The patient has made significant improvements in self-directed drive and motivation.  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  The above reason for service has been reviewed for this appointment remains applicable for the current visit.  The patient continues to have significant motivation issues but they are improving overall.  The patient's wife and the patient both report that he has been doing more activities around the house but it is still difficult for them to connect and I personally with the changes that have happened with the husband passed his cerebrovascular accident.  Treatment Interventions:  Cognitive/behavioral therapeutic intervention adjustment issues as well as working on Pharmacologist and strategies for dealing with residual frontal lobe cognitive deficits  Participation Level:   Active  Participation Quality:  Appropriate and Redirectable      Behavioral Observation:  Well Groomed, Alert, and Appropriate.   Current Psychosocial Factors: The patient reports that he and his wife are now expecting their third child.  The patient reports that he is happy about this and feels good about this new addition to their family.  The patient does report that he is continuing to struggle with finding things to do that he feels rewarded by.  Content of Session:    Current symptoms and continue to work on therapeutic interventions.  Effectiveness of Interventions: The patient was more engaging and involved today that he has been in the past.  While it was  difficult to get extensive responses from him he did initiate several questions today and this was the first time that he actively initiated responses versus being asked questions and him given simple one-word answers.  Target Goals:   Target goals include improving coping with his  significant loss of function and working on executive functioning's.  Goals Last Reviewed:   09/01/2019  Goals Addressed Today:    Goals today include working on therapeutic interventions for self initiation of behaviors.  Impression/Diagnosis:   Jeremy [name] is a 44 year old male referred by Dr. Letta [name] for neuropsychological consultation.  The patient was admitted to the [name] after he presented on 07/27/2006 with right-sided weakness, headache, facial droop and slurred speech.  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  There was also cerebral edema with midline shift visualized.  A decompressive hemicraniotomy was performed.  The patient had significant aphasia/expressive language symptoms and near complete right arm and right leg motor impairments.  There was a great deal of impulse control that was almost completely verbal in nature initially.  The patient was initially verbally resistant and negative interactions during some therapy sessions were noted.  Receptive language functions did appear to be intact.  There was apparent significant depression and anxiety symptoms due to sudden loss of function.  The patient was treated in the comprehensive inpatient rehabilitation program.  During that time I had the opportunity to work with the patient's 3 times that this is a follow-up from that 2019.  The patient was referred for psychotherapeutic/neuropsychological consultation in consulting.  The patient is described as continuing to have significant anxiety and have some significant lack of motivation and desire.  The patient's family deny that it appears to be significant depression but simply no desire internal self initiation.  The patient is described as being completely fine sitting on the sofa and letting his wife do everything.  The patient continues to be rather resistant and denying of some of the level of deficits that he has.  He continues to  have significant motor deficits in his right arm but less so in his right leg but also has some paralysis in his right leg.  The patient is described as having significant executive functioning deficits.  The patient was treated through [name] system starting July 27, 2017 through September 07, 2017.  The patient initially started inpatient rehab while in the [name] and continues with in-home rehab then graduating to outpatient rehab.  The patient is described as sleeping fine and having good appetite.  There are some memory deficits noted primarily as a delay with short-term memory recall.  The patient's wife is his primary caretaker.   The patient is described as having significant self initiation deficits along with paralysis on the right side of his body.  The patient is rather resistant to any efforts to try to get him to engage or do things although he is assisting on going back to work even though he has a significant deficits.  The patient has significant neuropsychological and cognitive deficits that continue.  The patient has significant executive functioning deficits along with his severe motor deficits.  The patient has almost no self initiation of behaviors unless he is very agitated emotionally.  The patient is currently disabled and would not be able to perform his ongoing job duties.     Ilean Skill, Psy.D. Clinical Psychologist Neuropsychologist

## 2019-09-05 ENCOUNTER — Emergency Department
Admission: EM | Admit: 2019-09-05 | Discharge: 2019-09-06 | Disposition: A | Discharge status: Home / Self Care | Payer: 59 | Attending: Emergency Medicine | Admitting: Emergency Medicine

## 2019-09-05 ENCOUNTER — Emergency Department: Payer: 59

## 2019-09-05 ENCOUNTER — Other Ambulatory Visit: Payer: Self-pay

## 2019-09-05 ENCOUNTER — Encounter: Payer: Self-pay | Admitting: Emergency Medicine

## 2019-09-05 DIAGNOSIS — Z79899 Other long term (current) drug therapy: Secondary | ICD-10-CM | POA: Insufficient documentation

## 2019-09-05 DIAGNOSIS — Z7901 Long term (current) use of anticoagulants: Secondary | ICD-10-CM | POA: Insufficient documentation

## 2019-09-05 DIAGNOSIS — R109 Unspecified abdominal pain: Secondary | ICD-10-CM | POA: Diagnosis present

## 2019-09-05 DIAGNOSIS — N200 Calculus of kidney: Secondary | ICD-10-CM | POA: Diagnosis not present

## 2019-09-05 DIAGNOSIS — I10 Essential (primary) hypertension: Secondary | ICD-10-CM | POA: Diagnosis not present

## 2019-09-05 DIAGNOSIS — J45909 Unspecified asthma, uncomplicated: Secondary | ICD-10-CM | POA: Diagnosis not present

## 2019-09-05 LAB — BASIC METABOLIC PANEL
Anion gap: 14 (ref 5–15)
BUN: 16 mg/dL (ref 6–20)
CO2: 22 mmol/L (ref 22–32)
Calcium: 9.2 mg/dL (ref 8.9–10.3)
Chloride: 101 mmol/L (ref 98–111)
Creatinine, Ser: 1.18 mg/dL (ref 0.61–1.24)
GFR calc Af Amer: >60 mL/min (ref >60)
GFR calc non Af Amer: >60 mL/min (ref >60)
Glucose, Bld: 133 mg/dL — ABNORMAL HIGH (ref 70–99)
Potassium: 3.9 mmol/L (ref 3.5–5.1)
Sodium: 137 mmol/L (ref 135–145)

## 2019-09-05 LAB — CBC
HCT: 48.2 % (ref 39.0–52.0)
Hemoglobin: 16.7 g/dL (ref 13.0–17.0)
MCH: 30.8 pg (ref 26.0–34.0)
MCHC: 34.6 g/dL (ref 30.0–36.0)
MCV: 88.8 fL (ref 80.0–100.0)
Platelets: 239 10*3/uL (ref 150–400)
RBC: 5.43 MIL/uL (ref 4.22–5.81)
RDW: 11.8 % (ref 11.5–15.5)
WBC: 19.1 10*3/uL — ABNORMAL HIGH (ref 4.0–10.5)
nRBC: 0 % (ref 0.0–0.2)

## 2019-09-05 MED ORDER — OXYCODONE-ACETAMINOPHEN 5-325 MG PO TABS: 1.0000 | ORAL_TABLET | Freq: Four times a day (QID) | ORAL | 0 refills | Status: DC | PRN

## 2019-09-05 MED ORDER — FENTANYL CITRATE (PF) 100 MCG/2ML IJ SOLN
50.0000 ug | Freq: Once | INTRAMUSCULAR | Status: AC
  Administered 2019-09-05: 50 ug via NASAL
  Filled 2019-09-05: qty 2

## 2019-09-05 MED ORDER — TAMSULOSIN HCL 0.4 MG PO CAPS: 0.4000 mg | ORAL_CAPSULE | Freq: Every day | ORAL | 0 refills | Status: DC

## 2019-09-05 MED ORDER — KETOROLAC TROMETHAMINE 10 MG PO TABS: 10.0000 mg | ORAL_TABLET | Freq: Three times a day (TID) | ORAL | 0 refills | Status: DC | PRN

## 2019-09-05 MED ORDER — ONDANSETRON 4 MG PO TBDP
4.0000 mg | ORAL_TABLET | Freq: Once | ORAL | Status: AC
  Administered 2019-09-05: 4 mg via ORAL
  Filled 2019-09-05: qty 1

## 2019-09-05 MED ORDER — SODIUM CHLORIDE 0.9 % IV BOLUS
1000.0000 mL | Freq: Once | INTRAVENOUS | Status: AC
  Administered 2019-09-06: 1000 mL via INTRAVENOUS

## 2019-09-05 MED ORDER — ONDANSETRON HCL 4 MG PO TABS: 4.0000 mg | ORAL_TABLET | Freq: Three times a day (TID) | ORAL | 0 refills | Status: DC | PRN

## 2019-09-05 MED ORDER — KETOROLAC TROMETHAMINE 30 MG/ML IJ SOLN
30.0000 mg | Freq: Once | INTRAMUSCULAR | Status: AC
  Administered 2019-09-06: 30 mg via INTRAVENOUS
  Filled 2019-09-05: qty 1

## 2019-09-05 NOTE — ED Provider Notes (Signed)
[facility] Emergency Department Provider Note   ____________________________________________   I have reviewed the triage vital signs and the nursing notes.   HISTORY  Chief Complaint Flank Pain   History limited by: Not Limited   HPI Jeremy [name] is a 44 y.o. male who presents to the emergency department today because of concern for left-sided flank pain.  The patient states that the pain started almost 1 week ago.  It has been fairly constant.  Has been accompanied by nausea vomiting.  He has noticed blood in his urine.  States he has a history of a kidney stone that he was able to pass on his own and this pain reminds him of that pain.  He denies any fevers.   Records reviewed. Per medical record review patient has a history of HTN, stroke.   Past Medical History:  Diagnosis Date  . Asthma    MILD AS A CHILD  . Deviated septum   . Dyspnea    NORMAL SPIROMETRY 02/01/17 VISIT WITH DR Davenport  . Hypertension   . Sleep apnea   . Stroke [facility])     Patient Active Problem List   Diagnosis Date Noted  . Spastic hemiparesis of dominant side ([place]) 06/26/2019  . Dvt femoral (deep venous thrombosis) ([place]) 01/06/2019  . History of cranioplasty 03/19/2018  . PE (pulmonary thromboembolism) ([place]) 10/06/2017  . Acute blood loss anemia   . Vascular headache   . Spastic hemiplegia of right dominant side as late effect of cerebral infarction ([place])   . Benign essential HTN   . Frontal lobe and executive function deficit following cerebral infarction   . Adjustment disorder with mixed anxiety and depressed mood   . Left middle cerebral artery stroke ([place]) 08/07/2017  . Aphasia due to acute stroke ([place]) 08/07/2017  . B12 deficiency   . Acute respiratory failure ([place])   . Central line infiltration ([place])   . Endotracheal tube present   . Fever   . Leukocytosis   . S/P craniotomy 07/30/2017  . Cerebral edema (HCC) 07/28/2017  . Hyperlipidemia 07/28/2017  . Stroke  (cerebrum) ([place]) 07/27/2017    Past Surgical History:  Procedure Laterality Date  . CRANIOPLASTY N/A 03/19/2018   Procedure: CRANIOPLASTY;  Surgeon: Consuella Lose, MD;  Location: [place];  Service: Neurosurgery;  Laterality: N/A;  CRANIOPLASTY  . CRANIOTOMY Left 07/29/2017   Procedure: LEFT HEMI- CRANIECTOMY WITH PLACEMENT OF BONE FLAP IN ABDOMEN;  Surgeon: Consuella Lose, MD;  Location: [place];  Service: Neurosurgery;  Laterality: Left;  . IR ANGIO INTRA EXTRACRAN SEL COM CAROTID INNOMINATE UNI R MOD SED  07/28/2017  . IR ANGIO VERTEBRAL SEL VERTEBRAL UNI R MOD SED  07/28/2017  . IR CT HEAD LTD  07/28/2017  . IR PERCUTANEOUS ART THROMBECTOMY/INFUSION INTRACRANIAL INC DIAG ANGIO  07/28/2017  . NASAL SEPTOPLASTY W/ TURBINOPLASTY Bilateral 03/06/2017   Procedure: NASAL SEPTOPLASTY WITH TURBINATE REDUCTION;  Surgeon: Beverly Gust, MD;  Location: [facility]S;  Service: ENT;  Laterality: Bilateral;  . RADIOLOGY WITH ANESTHESIA N/A 07/27/2017   Procedure: RADIOLOGY WITH ANESTHESIA;  Surgeon: Luanne Bras, MD;  Location: [place];  Service: Radiology;  Laterality: N/A;  . TEE WITHOUT CARDIOVERSION N/A 04/30/2019   Procedure: TRANSESOPHAGEAL ECHOCARDIOGRAM (TEE);  Surgeon: Kate Sable, MD;  Location: [facility] OR[facility];  Service: Cardiovascular;  Laterality: N/A;    Prior to Admission medications   Medication Sig Start Date End Date Taking? Authorizing Provider  amLODipine (NORVASC) 10 MG tablet Take 10 mg by mouth  daily. 04/14/19   [provider]  ELIQUIS 5 MG TABS tablet Take 5 mg by mouth 2 (two) times daily.  04/09/18   [provider]  ezetimibe (ZETIA) 10 MG tablet Take 10 mg by mouth daily.  01/31/18   [provider]  gabapentin (NEURONTIN) 400 MG capsule TAKE ONE CAPSULE BY MOUTH THREE TIMES A DAY 07/14/19   Kirsteins, Victorino Sparrow, MD  loratadine (CLARITIN) 10 MG tablet Take 10 mg by mouth daily.    [provider]  rosuvastatin (CRESTOR) 10 MG tablet Take 10  mg by mouth daily.    [provider]  sildenafil (VIAGRA) 50 MG tablet Take by mouth as needed. 05/22/19 06/21/19  [provider]  spironolactone (ALDACTONE) 100 MG tablet Take 100 mg by mouth daily.    [provider]    Allergies Blood-group specific substance and Lipitor [atorvastatin calcium]  Family History  Problem Relation Age of Onset  . Hypertension Father   . Hypertension Brother     Social History Social History   Tobacco Use  . Smoking status: Never Smoker  . Smokeless tobacco: Never Used  Substance Use Topics  . Alcohol use: Not Currently    Comment: RARE  . Drug use: No    Review of Systems Constitutional: No fever/chills Eyes: No visual changes. ENT: No sore throat. Cardiovascular: Denies chest pain. Respiratory: Denies shortness of breath. Gastrointestinal: Positive for left flank pain.    Genitourinary: Positive for hematuria.  Musculoskeletal: Negative for back pain. Skin: Negative for rash. Neurological: Negative for headaches, focal weakness or numbness.  ____________________________________________   PHYSICAL EXAM:  VITAL SIGNS: ED Triage Vitals  Enc Vitals Group     BP 09/05/19 2047 (!) 169/101     Pulse Rate 09/05/19 2047 94     Resp 09/05/19 2047 17     Temp 09/05/19 2047 98.8 F (37.1 C)     Temp Source 09/05/19 2047 Oral     SpO2 09/05/19 2047 100 %     Weight 09/05/19 2048 210 lb (95.3 kg)     Height 09/05/19 2048 5\' 11"  (1.803 m)     Head Circumference --      Peak Flow --      Pain Score 09/05/19 2047 10   Constitutional: Alert and oriented.  Eyes: Conjunctivae are normal.  ENT      Head: Normocephalic and atraumatic.      Nose: No congestion/rhinnorhea.      Mouth/Throat: Mucous membranes are moist.      Neck: No stridor. Hematological/Lymphatic/Immunilogical: No cervical lymphadenopathy. Cardiovascular: Normal rate, regular rhythm.  No murmurs, rubs, or gallops.  Respiratory: Normal  respiratory effort without tachypnea nor retractions. Breath sounds are clear and equal bilaterally. No wheezes/rales/rhonchi. Gastrointestinal: Soft and non tender. No rebound. No guarding.  Genitourinary: Deferred Musculoskeletal: Normal range of motion in all extremities. No lower extremity edema. Neurologic:  Right sided weakness Skin:  Skin is warm, dry and intact. No rash noted. Psychiatric: Mood and affect are normal. Speech and behavior are normal. Patient exhibits appropriate insight and judgment.  ____________________________________________    LABS (pertinent positives/negatives)  CBC wbc 19.1, hgb 16.7, plt 239 BMP wnl except glu 133  ____________________________________________   EKG  None  ____________________________________________    RADIOLOGY  CT renal Right 46mm UPJ  ____________________________________________   PROCEDURES  Procedures  ____________________________________________   INITIAL IMPRESSION / ASSESSMENT AND PLAN / ED COURSE  Pertinent labs & imaging results that were available during my  care of the patient were reviewed by me and considered in my medical decision making (see chart for details).   Patient presented to the emergency department today because of concerns for left flank pain.  CT renal was performed prior to my evaluation which did show a 6 mm stone at the UPJ.  I discussed this finding with the patient.  Will attempt pain control here in the emergency department.  Awaiting urine at time of signout. If UA is not consistent with infection do think is reasonable for patient be discharged home.  Will give patient urology follow-up information. .  ____________________________________________   FINAL CLINICAL IMPRESSION(S) / ED DIAGNOSES  Final diagnoses:  Left flank pain  Kidney stone     Note: This dictation was prepared with Dragon dictation. Any transcriptional errors that result from this process are  unintentional     Phineas Semen, MD 09/05/19 2336

## 2019-09-05 NOTE — ED Triage Notes (Signed)
Pt arrives via POV to ED with c/o left flank pain in his back. Pt was seen by urgent care on Tuesday for some bloody urine and was not scanned at that time but was checked for a UTI.

## 2019-09-05 NOTE — Discharge Instructions (Signed)
Please seek medical attention for any high fevers, chest pain, shortness of breath, change in behavior, persistent vomiting, bloody stool or any other new or concerning symptoms.  

## 2019-09-06 DIAGNOSIS — N200 Calculus of kidney: Secondary | ICD-10-CM | POA: Diagnosis not present

## 2019-09-06 LAB — URINALYSIS, COMPLETE (UACMP) WITH MICROSCOPIC
Bacteria, UA: NONE SEEN
Bilirubin Urine: NEGATIVE
Glucose, UA: NEGATIVE mg/dL
Hgb urine dipstick: NEGATIVE
Ketones, ur: NEGATIVE mg/dL
Leukocytes,Ua: NEGATIVE
Nitrite: NEGATIVE
Protein, ur: NEGATIVE mg/dL
Specific Gravity, Urine: 1.006 (ref 1.005–1.030)
Squamous Epithelial / HPF: NONE SEEN (ref 0–5)
pH: 7 (ref 5.0–8.0)

## 2019-09-06 MED ORDER — TAMSULOSIN HCL 0.4 MG PO CAPS
0.4000 mg | ORAL_CAPSULE | Freq: Once | ORAL | Status: AC
  Administered 2019-09-06: 0.4 mg via ORAL
  Filled 2019-09-06: qty 1

## 2019-09-06 MED ORDER — HYDROMORPHONE HCL 1 MG/ML IJ SOLN
0.5000 mg | Freq: Once | INTRAMUSCULAR | Status: AC
  Administered 2019-09-06: 0.5 mg via INTRAVENOUS
  Filled 2019-09-06: qty 1

## 2019-09-06 NOTE — ED Notes (Signed)
Pt st he is still in 10/10 pain after taking toradol, EDP notified, See [facility]

## 2019-09-06 NOTE — ED Notes (Signed)
Pt given a urinal and encouraged to call out for assistance when able to urinate. Pt was fed a cup of applesauce

## 2019-09-06 NOTE — ED Provider Notes (Signed)
-----------------------------------------   3:15 AM on 09/06/2019 -----------------------------------------  Patient was able to give a urine specimen after completion of IV fluids.  Pain is well controlled after IV Dilaudid.  UA is unremarkable for infection.  Will discharge home; prescriptions already sent to pharmacy by a previous provider.  Strict return precautions given.  Patient verbalizes understanding and agrees with plan of care.   Irean Hong, MD 09/06/19 [phone]

## 2019-11-19 ENCOUNTER — Encounter: Payer: 59 | Attending: Physical Medicine & Rehabilitation | Admitting: Psychology

## 2019-11-19 ENCOUNTER — Encounter: Payer: Self-pay | Admitting: Psychology

## 2019-11-19 ENCOUNTER — Other Ambulatory Visit: Payer: Self-pay

## 2019-11-19 DIAGNOSIS — R4701 Aphasia: Secondary | ICD-10-CM | POA: Diagnosis not present

## 2019-11-19 DIAGNOSIS — I10 Essential (primary) hypertension: Secondary | ICD-10-CM | POA: Diagnosis not present

## 2019-11-19 DIAGNOSIS — G4730 Sleep apnea, unspecified: Secondary | ICD-10-CM | POA: Insufficient documentation

## 2019-11-19 DIAGNOSIS — Z8249 Family history of ischemic heart disease and other diseases of the circulatory system: Secondary | ICD-10-CM | POA: Insufficient documentation

## 2019-11-19 DIAGNOSIS — G8110 Spastic hemiplegia affecting unspecified side: Secondary | ICD-10-CM | POA: Diagnosis not present

## 2019-11-19 DIAGNOSIS — F4323 Adjustment disorder with mixed anxiety and depressed mood: Secondary | ICD-10-CM

## 2019-11-19 DIAGNOSIS — I639 Cerebral infarction, unspecified: Secondary | ICD-10-CM | POA: Diagnosis not present

## 2019-11-19 DIAGNOSIS — I69319 Unspecified symptoms and signs involving cognitive functions following cerebral infarction: Secondary | ICD-10-CM

## 2019-11-19 DIAGNOSIS — I69314 Frontal lobe and executive function deficit following cerebral infarction: Secondary | ICD-10-CM

## 2019-11-19 DIAGNOSIS — I69351 Hemiplegia and hemiparesis following cerebral infarction affecting right dominant side: Secondary | ICD-10-CM | POA: Diagnosis present

## 2019-11-19 NOTE — Progress Notes (Signed)
Neuropsychology Visit  Patient:  Jeremy [name]   DOB: January 16, 1976  MR Number: [id number]  Location: [name] PHYSICAL MEDICINE AND REHABILITATION [address] [place], [address] [phone] [facility] [place] [address] Dept: [phone]  Date of Service: 11/19/2019   Start: 1:15 PM End: 2 PM  Duration of Service: 1 Hour Today's visit was an in person visit that was conducted with the patient, his wife and myself in my outpatient clinic office.    Provider/Observer:     Hershal Coria PsyD  Chief Complaint:      Chief Complaint  Patient presents with  . Anxiety  . Depression  . Agitation  . Cerebrovascular Accident    Reason For Service:     Jeremy [name] is a 44 year old male referred by Dr. Wynn Banker for neuropsychological consultation.  The patient was admitted to the hospital after he presented on 07/27/2006 with right-sided weakness, headache, facial droop and slurred speech.  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  There was also cerebral edema with midline shift visualized.  A decompressive hemicraniotomy was performed.  The patient had significant aphasia/expressive language symptoms and near complete right arm and right leg motor impairments.  There was a great deal of impulse control that was almost completely verbal in nature initially.  The patient was initially verbally resistant and negative interactions during some therapy sessions were noted.  Receptive language functions did appear to be intact.  There was apparent significant depression and anxiety symptoms due to sudden loss of function.  The patient was treated in the comprehensive inpatient rehabilitation program.  During that time I had the opportunity to work with the patient's 3 times that this is a follow-up from that 2019.    The above reason for service has been reviewed for this appointment remains applicable for  the current visit.  The patient has made significant improvements in self-directed drive and motivation.  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  The above reason for service has been reviewed for this appointment remains applicable for the current visit.  The patient continues to have significant motivation issues but they are improving overall.  The patient's wife and the patient both report that he has been doing more activities around the house but it is still difficult for them to connect and I personally with the changes that have happened with the husband passed his cerebrovascular accident.  Treatment Interventions:  Cognitive/behavioral therapeutic intervention adjustment issues as well as working on Pharmacologist and strategies for dealing with residual frontal lobe cognitive deficits  Participation Level:   Active  Participation Quality:  Appropriate and Redirectable      Behavioral Observation:  Well Groomed, Alert, and Appropriate.   Current Psychosocial Factors: Both the patient and his wife report that he is continuing to improve as far as his helpfulness around the house and self engagement and initiation of activities.  However, there continue to be some episodes of significant stress but overall these are improving.  The patient continues to have residual motor deficits and gait disorder and executive functioning deficits.  Content of Session:    Current symptoms and continue to work on therapeutic interventions.  Effectiveness of Interventions: The patient was more engaging and involved today that he has been in the past.  While  it was difficult to get extensive responses from him he did initiate several questions today and this was the first time that he actively initiated responses versus being asked questions and him given simple one-word answers.  Target  Goals:   Target goals include improving coping with his significant loss of function and working on executive functioning's.  Goals Last Reviewed:   11/19/2019  Goals Addressed Today:    Goals today include working on therapeutic interventions for self initiation of behaviors.  Impression/Diagnosis:   Jeremy [name] is a 44 year old male referred by Dr. Letta Pate for neuropsychological consultation.  The patient was admitted to the hospital after he presented on 07/27/2006 with right-sided weakness, headache, facial droop and slurred speech.  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  There was also cerebral edema with midline shift visualized.  A decompressive hemicraniotomy was performed.  The patient had significant aphasia/expressive language symptoms and near complete right arm and right leg motor impairments.  There was a great deal of impulse control that was almost completely verbal in nature initially.  The patient was initially verbally resistant and negative interactions during some therapy sessions were noted.  Receptive language functions did appear to be intact.  There was apparent significant depression and anxiety symptoms due to sudden loss of function.  The patient was treated in the comprehensive inpatient rehabilitation program.  During that time I had the opportunity to work with the patient's 3 times that this is a follow-up from that 2019.  The patient was referred for psychotherapeutic/neuropsychological consultation in consulting.  The patient is described as continuing to have significant anxiety and have some significant lack of motivation and desire.  The patient's family deny that it appears to be significant depression but simply no desire internal self initiation.  The patient is described as being completely fine sitting on the sofa and letting his wife do everything.  The patient continues to be rather resistant and denying of some of  the level of deficits that he has.  He continues to have significant motor deficits in his right arm but less so in his right leg but also has some paralysis in his right leg.  The patient is described as having significant executive functioning deficits.  The patient was treated through [facility] system starting July 27, 2017 through September 07, 2017.  The patient initially started inpatient rehab while in the hospital and continues with in-home rehab then graduating to outpatient rehab.  The patient is described as sleeping fine and having good appetite.  There are some memory deficits noted primarily as a delay with short-term memory recall.  The patient's wife is his primary caretaker.   The patient is described as having significant self initiation deficits along with paralysis on the right side of his body.  The patient is rather resistant to any efforts to try to get him to engage or do things although he is assisting on going back to work even though he has a significant deficits.  The patient has significant neuropsychological and cognitive deficits that continue.  The patient has significant executive functioning deficits along with his severe motor deficits.  The patient has almost no self initiation of behaviors unless he is very agitated emotionally.  The patient is currently disabled and would not be able to perform his ongoing job duties.     Ilean Skill, Psy.D. Clinical Psychologist Neuropsychologist

## 2019-11-21 ENCOUNTER — Other Ambulatory Visit: Payer: Self-pay

## 2019-11-21 ENCOUNTER — Encounter (HOSPITAL_BASED_OUTPATIENT_CLINIC_OR_DEPARTMENT_OTHER): Payer: 59 | Admitting: Physical Medicine & Rehabilitation

## 2019-11-21 ENCOUNTER — Encounter: Payer: Self-pay | Admitting: Physical Medicine & Rehabilitation

## 2019-11-21 VITALS — BP 138/95 | HR 74 | Temp 97.5°F | Ht 71.0 in | Wt 210.0 lb

## 2019-11-21 DIAGNOSIS — G8110 Spastic hemiplegia affecting unspecified side: Secondary | ICD-10-CM

## 2019-11-21 DIAGNOSIS — I69351 Hemiplegia and hemiparesis following cerebral infarction affecting right dominant side: Secondary | ICD-10-CM | POA: Diagnosis not present

## 2019-11-21 NOTE — Progress Notes (Signed)
Subjective:    Patient ID: Jeremy [name], male    DOB: Jul 11, 1976, 44 y.o.   MRN: [id number]  HPI 44 year old male with history of large left MCA January 2019.  Distribution infarct with chronic right hemiparesis and spasticity.  The patient required craniotomy with flap placement in the abdominal cavity because of severe cerebral edema.  This was replaced at a later time.  Last right tibial nerve block using phenol was performed in the December 2020.  He does not feel like his toes are curling much and his foot is not inverting much. He has had problems with his right AFO and this was checked by his orthotist and found that one of the plates is cracked and the AFO needs to be replaced. The patient does require the AFO to prevent foot drag and falls as well as ankle injury.  The patient no longer gets Botox injections with right upper extremity despite some necessity he does wear the splint at night to help prevent hand contracture Pain Inventory Average Pain 3 Pain Right Now 1 My pain is n/a  In the last 24 hours, has pain interfered with the following? General activity 7 Relation with others 7 Enjoyment of life 7 What TIME of day is your pain at its worst? daytime Sleep (in general) Good  Pain is worse with: walking, bending and standing Pain improves with: rest and heat/ice Relief from Meds: 2  Mobility walk with assistance use a cane ability to climb steps?  yes do you drive?  no  Function disabled: date disabled . I need assistance with the following:  meal prep, household duties and shopping Do you have any goals in this area?  yes  Neuro/Psych spasms  Prior Studies Any changes since last visit?  no  Physicians involved in your care Any changes since last visit?  no   Family History  Problem Relation Age of Onset  . Hypertension Father   . Hypertension Brother    Social History   Socioeconomic History  . Marital status: Married    Spouse name: [name]  .  Number of children: 2  . Years of education: Not on file  . Highest education level: Not on file  Occupational History  . Not on file  Tobacco Use  . Smoking status: Never Smoker  . Smokeless tobacco: Never Used  Substance and Sexual Activity  . Alcohol use: Not Currently    Comment: RARE  . Drug use: No  . Sexual activity: Not on file  Other Topics Concern  . Not on file  Social History Narrative  . Not on file   Social Determinants of Health   Financial Resource Strain: Low Risk   . Difficulty of Paying Living Expenses: Not very hard  Food Insecurity: No Food Insecurity  . Worried About Programme researcher, broadcasting/film/video in the Last Year: Never true  . Ran Out of Food in the Last Year: Never true  Transportation Needs: No Transportation Needs  . Lack of Transportation (Medical): No  . Lack of Transportation (Non-Medical): No  Physical Activity:   . Days of Exercise per Week:   . Minutes of Exercise per Session:   Stress:   . Feeling of Stress :   Social Connections: Unknown  . Frequency of Communication with Friends and Family: More than three times a week  . Frequency of Social Gatherings with Friends and Family: Not on file  . Attends Religious Services: Not on file  . Active Member of  Clubs or Organizations: Not on file  . Attends Banker Meetings: Not on file  . Marital Status: Not on file   Past Surgical History:  Procedure Laterality Date  . CRANIOPLASTY N/A 03/19/2018   Procedure: CRANIOPLASTY;  Surgeon: Lisbeth Renshaw, MD;  Location: [facility] OR;  Service: Neurosurgery;  Laterality: N/A;  CRANIOPLASTY  . CRANIOTOMY Left 07/29/2017   Procedure: LEFT HEMI- CRANIECTOMY WITH PLACEMENT OF BONE FLAP IN ABDOMEN;  Surgeon: Lisbeth Renshaw, MD;  Location: [facility];  Service: Neurosurgery;  Laterality: Left;  . IR ANGIO INTRA EXTRACRAN SEL COM CAROTID INNOMINATE UNI R MOD SED  07/28/2017  . IR ANGIO VERTEBRAL SEL VERTEBRAL UNI R MOD SED  07/28/2017  . IR CT HEAD LTD  07/28/2017   . IR PERCUTANEOUS ART THROMBECTOMY/INFUSION INTRACRANIAL INC DIAG ANGIO  07/28/2017  . NASAL SEPTOPLASTY W/ TURBINOPLASTY Bilateral 03/06/2017   Procedure: NASAL SEPTOPLASTY WITH TURBINATE REDUCTION;  Surgeon: Linus Salmons, MD;  Location: [facility];  Service: ENT;  Laterality: Bilateral;  . RADIOLOGY WITH ANESTHESIA N/A 07/27/2017   Procedure: RADIOLOGY WITH ANESTHESIA;  Surgeon: Julieanne Cotton, MD;  Location: [facility] OR;  Service: Radiology;  Laterality: N/A;  . TEE WITHOUT CARDIOVERSION N/A 04/30/2019   Procedure: TRANSESOPHAGEAL ECHOCARDIOGRAM (TEE);  Surgeon: Debbe Odea, MD;  Location: [facility];  Service: Cardiovascular;  Laterality: N/A;   Past Medical History:  Diagnosis Date  . Asthma    MILD AS A CHILD  . Deviated septum   . Dyspnea    NORMAL SPIROMETRY 02/01/17 VISIT WITH DR HEDRICK  . Hypertension   . Sleep apnea   . Stroke (HCC)    Temp (!) 97.5 F (36.4 C)   Ht 5\' 11"  (1.803 m)   Wt 210 lb (95.3 kg)   BMI 29.29 kg/m   Opioid Risk Score:   Fall Risk Score:  `1  Depression screen PHQ 2/9  Depression screen [facility] 2/9 06/05/2019 09/24/2017  Decreased Interest 0 2  Down, Depressed, Hopeless 0 1  PHQ - 2 Score 0 3  Altered sleeping - 0  Tired, decreased energy - 2  Change in appetite - 2  Feeling bad or failure about yourself  - 1  Trouble concentrating - 0  Moving slowly or fidgety/restless - 0  Suicidal thoughts - 0  PHQ-9 Score - 8  Difficult doing work/chores - Somewhat difficult  Some recent data might be hidden    Review of Systems  Constitutional: Negative.   HENT: Negative.   Eyes: Negative.   Respiratory: Negative.   Cardiovascular: Negative.   Gastrointestinal: Negative.   Endocrine: Negative.   Genitourinary: Negative.   Musculoskeletal: Positive for arthralgias and gait problem.       Spasms  Allergic/Immunologic: Negative.   Psychiatric/Behavioral: Negative.   All other systems reviewed and are negative.      Objective:    Physical Exam Vitals and nursing note reviewed.  Constitutional:      Appearance: Normal appearance.  Eyes:     Extraocular Movements: Extraocular movements intact.     Conjunctiva/sclera: Conjunctivae normal.     Pupils: Pupils are equal, round, and reactive to light.  Neurological:     General: No focal deficit present.     Mental Status: He is alert and oriented to person, place, and time.  Psychiatric:        Mood and Affect: Mood normal.        Behavior: Behavior normal.     Tone MAS 3 spasticity in the right finger flexors  MAS 2 in the right wrist flexors. Motor strength 0/5 in the right deltoid by stress of grip Right lower extremity 3 - in the hip flexor knee extensor Right lower extremity tone no evidence of clonus at the ankle      Assessment & Plan:  #1.  History of left MCA infarct with residual right spastic hemiplegia He requires the AFO on the right side for ambulation.  He is quite independent and does help care for young children at home. Have written prescription for right double metal upright dual channel AFO replacement Calcium patient back in about 6 months. The patient has increasing tone in the right lower extremity such as toe curling her foot inverting he can repeat the right tibial nerve block with phenol

## 2019-11-21 NOTE — Patient Instructions (Signed)
Please call to schedule repeat Right tibial nerve block with phenol, if your Righ ttoes start curling and your foot turns sideways

## 2019-12-04 ENCOUNTER — Ambulatory Visit
Admission: RE | Admit: 2019-12-04 | Discharge: 2019-12-04 | Disposition: A | Discharge status: Home / Self Care | Payer: 59 | Source: Ambulatory Visit | Attending: Urology | Admitting: Urology

## 2019-12-04 ENCOUNTER — Encounter: Payer: Self-pay | Admitting: Urology

## 2019-12-04 ENCOUNTER — Ambulatory Visit (INDEPENDENT_AMBULATORY_CARE_PROVIDER_SITE_OTHER): Payer: 59 | Admitting: Urology

## 2019-12-04 ENCOUNTER — Other Ambulatory Visit: Payer: Self-pay

## 2019-12-04 VITALS — BP 130/92 | HR 90 | Ht 71.0 in | Wt 209.0 lb

## 2019-12-04 DIAGNOSIS — N200 Calculus of kidney: Secondary | ICD-10-CM

## 2019-12-04 DIAGNOSIS — N201 Calculus of ureter: Secondary | ICD-10-CM | POA: Diagnosis not present

## 2019-12-04 NOTE — Progress Notes (Signed)
12/04/19 10:05 AM   Cheryl Flash Irizarry Nov 26, 1975 [id number]  CC: Left ureteral stone  HPI: I saw Mr. Xia in urology clinic today for evaluation of a left ureteral stone.  He is a 44 year old male who had a significant stroke in January 2019 with residual right hemiparesis and spasticity.  He was seen in the ER with left-sided flank pain all the way back in February 2021 and CT showed a left 7 mm proximal ureteral stone.  Urinalysis was non-infected and he was discharged with medical expulsive therapy.  It is unclear why he is not following up until now.  He reports he has had ongoing intermittent left-sided flank and left abdominal pain.  He denies any fevers or chills or urinary symptoms.  He denies any gross hematuria.  He has always passed stones spontaneously and has never required surgery.  He is anticoagulated with Eliquis at baseline with his history of stroke.  He was unable to void for urine sample today.  PMH: Past Medical History:  Diagnosis Date  . Asthma    MILD AS A CHILD  . Deviated septum   . Dyspnea    NORMAL SPIROMETRY 02/01/17 VISIT WITH DR [place]  . Hypertension   . Sleep apnea   . Stroke [facility])     Surgical History: Past Surgical History:  Procedure Laterality Date  . CRANIOPLASTY N/A 03/19/2018   Procedure: CRANIOPLASTY;  Surgeon: Consuella Lose, MD;  Location: [place];  Service: Neurosurgery;  Laterality: N/A;  CRANIOPLASTY  . CRANIOTOMY Left 07/29/2017   Procedure: LEFT HEMI- CRANIECTOMY WITH PLACEMENT OF BONE FLAP IN ABDOMEN;  Surgeon: Consuella Lose, MD;  Location: [place];  Service: Neurosurgery;  Laterality: Left;  . IR ANGIO INTRA EXTRACRAN SEL COM CAROTID INNOMINATE UNI R MOD SED  07/28/2017  . IR ANGIO VERTEBRAL SEL VERTEBRAL UNI R MOD SED  07/28/2017  . IR CT HEAD LTD  07/28/2017  . IR PERCUTANEOUS ART THROMBECTOMY/INFUSION INTRACRANIAL INC DIAG ANGIO  07/28/2017  . NASAL SEPTOPLASTY W/ TURBINOPLASTY Bilateral 03/06/2017   Procedure: NASAL SEPTOPLASTY WITH  TURBINATE REDUCTION;  Surgeon: Beverly Gust, MD;  Location: [facility];  Service: ENT;  Laterality: Bilateral;  . RADIOLOGY WITH ANESTHESIA N/A 07/27/2017   Procedure: RADIOLOGY WITH ANESTHESIA;  Surgeon: Luanne Bras, MD;  Location: [place];  Service: Radiology;  Laterality: N/A;  . TEE WITHOUT CARDIOVERSION N/A 04/30/2019   Procedure: TRANSESOPHAGEAL ECHOCARDIOGRAM (TEE);  Surgeon: Kate Sable, MD;  Location: [facility];  Service: Cardiovascular;  Laterality: N/A;    Family History: Family History  Problem Relation Age of Onset  . Hypertension Father   . Hypertension Brother     Social History:  reports that he has never smoked. He has never used smokeless tobacco. He reports previous alcohol use. He reports that he does not use drugs.  Physical Exam: BP (!) 130/92   Pulse 90   Ht 5\' 11"  (1.803 m)   Wt 209 lb (94.8 kg)   BMI 29.15 kg/m    Constitutional:  Alert and oriented, No acute distress. Cardiovascular: No clubbing, cyanosis, or edema. Respiratory: Normal respiratory effort, no increased work of breathing. GI: Abdomen is soft, nontender, nondistended, no abdominal masses GU: Left CVA tenderness  Laboratory Data: Reviewed  Pertinent Imaging: I have personally reviewed the CT stone protocol and KUB.  On review of the KUB from today, the left ureteral stone appears to have migrated to the left distal ureter.  Assessment & Plan:   In summary, is a 44 year old male with  likely persistent 7 mm left ureteral stone that was originally diagnosed back in February 2021.  He continues to have left-sided flank pain.   We discussed various treatment options for urolithiasis including observation with or without medical expulsive therapy, shockwave lithotripsy (SWL), ureteroscopy and laser lithotripsy with stent placement, and percutaneous nephrolithotomy.  We discussed that management is based on stone size, location, density, patient co-morbidities, and patient  preference.   Stones <33mm in size have a >80% spontaneous passage rate. Data surrounding the use of tamsulosin for medical expulsive therapy is controversial, but meta analyses suggests it is most efficacious for distal stones between 5-39mm in size. Possible side effects include dizziness/lightheadedness, and retrograde ejaculation.  SWL has a lower stone free rate in a single procedure, but also a lower complication rate compared to ureteroscopy and avoids a stent and associated stent related symptoms. Possible complications include renal hematoma, steinstrasse, and need for additional treatment.  Ureteroscopy with laser lithotripsy and stent placement has a higher stone free rate than SWL in a single procedure, however increased complication rate including possible infection, ureteral injury, bleeding, and stent related morbidity. Common stent related symptoms include dysuria, urgency/frequency, and flank pain.  I think he is a better candidate for ureteroscopy and laser lithotripsy now that the stone has migrated distally.  Additionally he would not have to stop his Eliquis for ureteroscopy.  We discussed the risks and benefits at length.  He would like to discuss further with his wife before scheduling surgery.  I was very frank with the patient that by deferring further treatment he risk further kidney injury on the left side, renal atrophy, infection, and ongoing pain.  Recommend scheduling left ureteroscopy, laser lithotripsy, stent placement, patient will contact to schedule as he would like to first discuss with his wife  Legrand Rams, MD 12/04/2019  [facility] [address] [place], [place] [address] [phone]

## 2019-12-04 NOTE — Patient Instructions (Signed)
Laser Therapy for Kidney Stones Laser therapy for kidney stones is a procedure to break up small, hard mineral deposits that form in the kidney (kidney stones). The procedure is done using a device that produces a focused beam of light (laser). The laser breaks up kidney stones into pieces that are small enough to be passed out of the body through urination or removed from the body during the procedure. You may need laser therapy if you have kidney stones that are painful or block your urinary tract. This procedure is done by inserting a tube (ureteroscope) into your kidney through the urethral opening. The urethra is the part of the body that drains urine from the bladder. In women, the urethra opens above the vaginal opening. In men, the urethra opens at the tip of the penis. The ureteroscope is inserted through the urethra, and surgical instruments are moved through the bladder and the muscular tube that connects the kidney to the bladder (ureter) until they reach the kidney. Tell a health care provider about:  Any allergies you have.  All medicines you are taking, including vitamins, herbs, eye drops, creams, and over-the-counter medicines.  Any problems you or family members have had with anesthetic medicines.  Any blood disorders you have.  Any surgeries you have had.  Any medical conditions you have.  Whether you are pregnant or may be pregnant. What are the risks? Generally, this is a safe procedure. However, problems may occur, including:  Infection.  Bleeding.  Allergic reactions to medicines.  Damage to the urethra, bladder, or ureter.  Urinary tract infection (UTI).  Narrowing of the urethra (urethral stricture).  Difficulty passing urine.  Blockage of the kidney caused by a fragment of kidney stone. What happens before the procedure? Medicines  Ask your health care provider about: ? Changing or stopping your regular medicines. This is especially important if you  are taking diabetes medicines or blood thinners. ? Taking medicines such as aspirin and ibuprofen. These medicines can thin your blood. Do not take these medicines unless your health care provider tells you to take them. ? Taking over-the-counter medicines, vitamins, herbs, and supplements. Eating and drinking Follow instructions from your health care provider about eating and drinking, which may include:  8 hours before the procedure - stop eating heavy meals or foods, such as meat, fried foods, or fatty foods.  6 hours before the procedure - stop eating light meals or foods, such as toast or cereal.  6 hours before the procedure - stop drinking milk or drinks that contain milk.  2 hours before the procedure - stop drinking clear liquids. Staying hydrated Follow instructions from your health care provider about hydration, which may include:  Up to 2 hours before the procedure - you may continue to drink clear liquids, such as water, clear fruit juice, black coffee, and plain tea.  General instructions  You may have a physical exam before the procedure. You may also have tests, such as imaging tests and blood or urine tests.  If your ureter is too narrow, your health care provider may place a soft, flexible tube (stent) inside of it. The stent may be placed days or weeks before your laser therapy procedure.  Plan to have someone take you home from the hospital or clinic.  If you will be going home right after the procedure, plan to have someone stay with you for 24 hours.  Do not use any products that contain nicotine or tobacco for at least 4  weeks before the procedure. These products include cigarettes, e-cigarettes, and chewing tobacco. If you need help quitting, ask your health care provider.  Ask your health care provider: ? How your surgical site will be marked or identified. ? What steps will be taken to help prevent infection. These may include:  Removing hair at the surgery  site.  Washing skin with a germ-killing soap.  Taking antibiotic medicine. What happens during the procedure?   An IV will be inserted into one of your veins.  You will be given one or more of the following: ? A medicine to help you relax (sedative). ? A medicine to numb the area (local anesthetic). ? A medicine to make you fall asleep (general anesthetic).  A ureteroscope will be inserted into your urethra. The ureteroscope will send images to a video screen in the operating room to guide your surgeon to the area of your kidney that will be treated.  A small, flexible tube will be threaded through the ureteroscope and into your bladder and ureter, up to your kidney.  The laser device will be inserted into your kidney through the tube. Your surgeon will pulse the laser on and off to break up kidney stones.  A surgical instrument that has a tiny wire basket may be inserted through the tube into your kidney to remove the pieces of broken kidney stone. The procedure may vary among health care providers and hospitals. What happens after the procedure?  Your blood pressure, heart rate, breathing rate, and blood oxygen level will be monitored until you leave the hospital or clinic.  You will be given pain medicine as needed.  You may continue to receive antibiotics.  You may have a stent temporarily placed in your ureter.  Do not drive for 24 hours if you were given a sedative during your procedure.  You may be given a strainer to collect any stone fragments that you pass in your urine. Your health care provider may have these tested. Summary  Laser therapy for kidney stones is a procedure to break up kidney stones into pieces that are small enough to be passed out of the body through urination or removed during the procedure.  Follow instructions from your health care provider about eating and drinking before the procedure.  During the procedure, the ureteroscope will send images  to a video screen to guide your surgeon to the area of your kidney that will be treated.  Do not drive for 24 hours if you were given a sedative during your procedure. This information is not intended to replace advice given to you by your health care provider. Make sure you discuss any questions you have with your health care provider. Document Revised: 03/14/2018 Document Reviewed: 03/14/2018 Elsevier Patient Education  [place].   Ureteral Stent Implantation  Ureteral stent implantation is a procedure to insert (implant) a flexible, soft, plastic tube (stent) into a ureter. Ureters are the tube-like parts of the body that drain urine from the kidneys. The stent supports the ureter while it heals and helps to drain urine. You may have a ureteral stent implanted after having a procedure to remove a blockage from the ureter (ureterolysis or pyeloplasty). You may also have a stent implanted to open the flow of urine when you have a blockage caused by a kidney stone, tumor, blood clot, or infection. You have two ureters, one on each side of the body. The ureters connect the kidneys to the organ that holds urine  until it passes out of the body (bladder). The stent is placed so that one end is in the kidney, and one end is in the bladder. The stent is usually taken out after your ureter has healed. Depending on your condition, you may have a stent for just a few weeks, or you may have a long-term stent that will need to be replaced every few months. Tell a health care provider about:  Any allergies you have.  All medicines you are taking, including vitamins, herbs, eye drops, creams, and over-the-counter medicines.  Any problems you or family members have had with anesthetic medicines.  Any blood disorders you have.  Any surgeries you have had.  Any medical conditions you have.  Whether you are pregnant or may be pregnant. What are the risks? Generally, this is a safe procedure.  However, problems may occur, including:  Infection.  Bleeding.  Allergic reactions to medicines.  Damage to other structures or organs. Tearing (perforation) of the ureter is possible.  Movement of the stent away from where it is placed during surgery (migration). What happens before the procedure? Medicines Ask your health care provider about:  Changing or stopping your regular medicines. This is especially important if you are taking diabetes medicines or blood thinners.  Taking medicines such as aspirin and ibuprofen. These medicines can thin your blood. Do not take these medicines unless your health care provider tells you to take them.  Taking over-the-counter medicines, vitamins, herbs, and supplements. Eating and drinking Follow instructions from your health care provider about eating and drinking, which may include:  8 hours before the procedure - stop eating heavy meals or foods, such as meat, fried foods, or fatty foods.  6 hours before the procedure - stop eating light meals or foods, such as toast or cereal.  6 hours before the procedure - stop drinking milk or drinks that contain milk.  2 hours before the procedure - stop drinking clear liquids. Staying hydrated Follow instructions from your health care provider about hydration, which may include:  Up to 2 hours before the procedure - you may continue to drink clear liquids, such as water, clear fruit juice, black coffee, and plain tea. General instructions  Do not drink alcohol.  Do not use any products that contain nicotine or tobacco for at least 4 weeks before the procedure. These products include cigarettes, e-cigarettes, and chewing tobacco. If you need help quitting, ask your health care provider.  You may have an exam or testing, such as imaging or blood tests.  Ask your health care provider what steps will be taken to help prevent infection. These may include: ? Removing hair at the surgery  site. ? Washing skin with a germ-killing soap. ? Taking antibiotic medicine.  Plan to have someone take you home from the hospital or clinic.  If you will be going home right after the procedure, plan to have someone with you for 24 hours. What happens during the procedure?  An IV will be inserted into one of your veins.  You may be given a medicine to help you relax (sedative).  You may be given a medicine to make you fall asleep (general anesthetic).  A thin, tube-shaped instrument with a light and tiny camera at the end (cystoscope) will be inserted into your urethra. The urethra is the tube that drains urine from the bladder out of the body. In men, the urethra opens at the end of the penis. In women, the urethra opens   in front of the vaginal opening.  The cystoscope will be passed into your bladder.  A thin wire (guide wire) will be passed through your bladder and into your ureter. This is used to guide the stent into your ureter.  The stent will be inserted into your ureter.  The guide wire and the cystoscope will be removed.  A flexible tube (catheter) may be inserted through your urethra so that one end is in your bladder. This helps to drain urine from your bladder. The procedure may vary among hospitals and health care providers. What happens after the procedure?  Your blood pressure, heart rate, breathing rate, and blood oxygen level will be monitored until you leave the hospital or clinic.  You may continue to receive medicine and fluids through an IV.  You may have some soreness or pain in your abdomen and urethra. Medicines will be available to help you.  You will be encouraged to get up and walk around as soon as you can.  You may have a catheter draining your urine.  You will have some blood in your urine.  Do not drive for 24 hours if you were given a sedative during your procedure. Summary  Ureteral stent implantation is a procedure to insert a flexible,  soft, plastic tube (stent) into a ureter.  You may have a stent implanted to support the ureter while it heals after a procedure or to open the flow of urine if there is a blockage.  Follow instructions from your health care provider about taking medicines and about eating and drinking before the procedure.  Depending on your condition, you may have a stent for just a few weeks, or you may have a long-term stent that will need to be replaced every few months. This information is not intended to replace advice given to you by your health care provider. Make sure you discuss any questions you have with your health care provider. Document Revised: 04/09/2018 Document Reviewed: 04/10/2018 Elsevier Patient Education  2020 Elsevier Inc.   Lithotripsy  Lithotripsy is a treatment that can sometimes help eliminate kidney stones and the pain that they cause. A form of lithotripsy, also known as extracorporeal shock wave lithotripsy, is a nonsurgical procedure that crushes a kidney stone with shock waves. These shock waves pass through your body and focus on the kidney stone. They cause the kidney stone to break up while it is still in the urinary tract. This makes it easier for the smaller pieces of stone to pass in the urine. Tell a health care provider about:  Any allergies you have.  All medicines you are taking, including vitamins, herbs, eye drops, creams, and over-the-counter medicines.  Any blood disorders you have.  Any surgeries you have had.  Any medical conditions you have.  Whether you are pregnant or may be pregnant.  Any problems you or family members have had with anesthetic medicines. What are the risks? Generally, this is a safe procedure. However, problems may occur, including:  Infection.  Bleeding of the kidney.  Bruising of the kidney or skin.  Scarring of the kidney, which can lead to: ? Increased blood pressure. ? Poor kidney function. ? Return (recurrence) of  kidney stones.  Damage to other structures or organs, such as the liver, colon, spleen, or pancreas.  Blockage (obstruction) of the the tube that carries urine from the kidney to the bladder (ureter).  Failure of the kidney stone to break into pieces (fragments). What happens before the   procedure? Staying hydrated Follow instructions from your health care provider about hydration, which may include:  Up to 2 hours before the procedure - you may continue to drink clear liquids, such as water, clear fruit juice, black coffee, and plain tea. Eating and drinking restrictions Follow instructions from your health care provider about eating and drinking, which may include:  8 hours before the procedure - stop eating heavy meals or foods such as meat, fried foods, or fatty foods.  6 hours before the procedure - stop eating light meals or foods, such as toast or cereal.  6 hours before the procedure - stop drinking milk or drinks that contain milk.  2 hours before the procedure - stop drinking clear liquids. General instructions  Plan to have someone take you home from the hospital or clinic.  Ask your health care provider about: ? Changing or stopping your regular medicines. This is especially important if you are taking diabetes medicines or blood thinners. ? Taking medicines such as aspirin and ibuprofen. These medicines and other NSAIDs can thin your blood. Do not take these medicines for 7 days before your procedure if your health care provider instructs you not to.  You may have tests, such as: ? Blood tests. ? Urine tests. ? Imaging tests, such as a CT scan. What happens during the procedure?  To lower your risk of infection: ? Your health care team will wash or sanitize their hands. ? Your skin will be washed with soap.  An IV tube will be inserted into one of your veins. This tube will give you fluids and medicines.  You will be given one or more of the following: ? A  medicine to help you relax (sedative). ? A medicine to make you fall asleep (general anesthetic).  A water-filled cushion may be placed behind your kidney or on your abdomen. In some cases you may be placed in a tub of lukewarm water.  Your body will be positioned in a way that makes it easy to target the kidney stone.  A flexible tube with holes in it (stent) may be placed in the ureter. This will help keep urine flowing from the kidney if the fragments of the stone have been blocking the ureter.  An X-ray or ultrasound exam will be done to locate your stone.  Shock waves will be aimed at the stone. If you are awake, you may feel a tapping sensation as the shock waves pass through your body. The procedure may vary among health care providers and hospitals. What happens after the procedure?  You may have an X-ray to see whether the procedure was able to break up the kidney stone and how much of the stone has passed. If large stone fragments remain after treatment, you may need to have a second procedure at a later time.  Your blood pressure, heart rate, breathing rate, and blood oxygen level will be monitored until the medicines you were given have worn off.  You may be given antibiotics or pain medicine as needed.  If a stent was placed in your ureter during surgery, it may stay in place for a few weeks.  You may need strain your urine to collect pieces of the kidney stone for testing.  You will need to drink plenty of water.  Do not drive for 24 hours if you were given a sedative. Summary  Lithotripsy is a treatment that can sometimes help eliminate kidney stones and the pain that they cause.  A form of lithotripsy, also known as extracorporeal shock wave lithotripsy, is a nonsurgical procedure that crushes a kidney stone with shock waves.  Generally, this is a safe procedure. However, problems may occur, including damage to the kidney or other organs, infection, or obstruction of  the tube that carries urine from the kidney to the bladder (ureter).  When you go home, you will need to drink plenty of water. You may be asked to strain your urine to collect pieces of the kidney stone for testing. This information is not intended to replace advice given to you by your health care provider. Make sure you discuss any questions you have with your health care provider. Document Revised: 10/14/2018 Document Reviewed: 05/24/2016 Elsevier Patient Education  2020 Elsevier Inc.  

## 2019-12-23 ENCOUNTER — Telehealth: Payer: Self-pay | Admitting: Radiology

## 2019-12-23 NOTE — Telephone Encounter (Signed)
Unable to reach patient by phone. Spoke to wife regarding left URS/LL/stent for stone treatment. Patient would like to schedule procedure on 01/09/2020. Will call patient with instructions once scheduled.

## 2019-12-24 ENCOUNTER — Other Ambulatory Visit: Payer: Self-pay | Admitting: Radiology

## 2019-12-24 DIAGNOSIS — N201 Calculus of ureter: Secondary | ICD-10-CM

## 2019-12-31 ENCOUNTER — Other Ambulatory Visit: Payer: Self-pay | Admitting: *Deleted

## 2019-12-31 ENCOUNTER — Other Ambulatory Visit: Payer: 59

## 2019-12-31 MED ORDER — GABAPENTIN 400 MG PO CAPS: 400.0000 mg | ORAL_CAPSULE | Freq: Three times a day (TID) | ORAL | 2 refills | Status: DC

## 2020-01-06 ENCOUNTER — Telehealth: Payer: Self-pay | Admitting: *Deleted

## 2020-01-06 NOTE — Telephone Encounter (Signed)
Received clearance form for L ureteral stone scheduled 01-16-20

## 2020-01-06 NOTE — Telephone Encounter (Signed)
Completed and placed in out box.  Thank you. 

## 2020-01-07 ENCOUNTER — Other Ambulatory Visit: Payer: 59

## 2020-01-07 ENCOUNTER — Other Ambulatory Visit: Payer: Self-pay

## 2020-01-07 DIAGNOSIS — N200 Calculus of kidney: Secondary | ICD-10-CM

## 2020-01-07 DIAGNOSIS — Z01818 Encounter for other preprocedural examination: Secondary | ICD-10-CM

## 2020-01-07 NOTE — Telephone Encounter (Signed)
Fax confirmation received [facility] clearance form.  [phone], [phone].

## 2020-01-08 ENCOUNTER — Other Ambulatory Visit: Payer: 59

## 2020-01-08 ENCOUNTER — Other Ambulatory Visit: Payer: Self-pay

## 2020-01-08 DIAGNOSIS — N200 Calculus of kidney: Secondary | ICD-10-CM

## 2020-01-08 DIAGNOSIS — Z01818 Encounter for other preprocedural examination: Secondary | ICD-10-CM

## 2020-01-09 ENCOUNTER — Encounter
Admission: RE | Admit: 2020-01-09 | Discharge: 2020-01-09 | Disposition: A | Discharge status: Home / Self Care | Payer: 59 | Source: Ambulatory Visit | Attending: Urology | Admitting: Urology

## 2020-01-09 ENCOUNTER — Other Ambulatory Visit: Payer: Self-pay

## 2020-01-09 HISTORY — DX: Personal history of urinary calculi: Z87.442

## 2020-01-09 HISTORY — DX: Abnormal levels of other serum enzymes: R74.8

## 2020-01-09 LAB — MICROSCOPIC EXAMINATION: Bacteria, UA: NONE SEEN

## 2020-01-09 LAB — URINALYSIS, COMPLETE
Bilirubin, UA: NEGATIVE
Glucose, UA: NEGATIVE
Ketones, UA: NEGATIVE
Leukocytes,UA: NEGATIVE
Nitrite, UA: NEGATIVE
Specific Gravity, UA: >1.03 — ABNORMAL HIGH (ref 1.005–1.030)
Urobilinogen, Ur: 0.2 mg/dL (ref 0.2–1.0)
pH, UA: 5.5 (ref 5.0–7.5)

## 2020-01-09 NOTE — Patient Instructions (Addendum)
Your procedure is scheduled on:  01-16-20 FRIDAY Report to Same Day Surgery 2nd floor medical mall [facility] Entrance-take elevator on left to 2nd floor.  Check in with surgery information desk.) To find out your arrival time please call [phone] between 1PM - 3PM on 01-15-20 THURSDAY  Remember: Instructions that are not followed completely may result in serious medical risk, up to and including death, or upon the discretion of your surgeon and anesthesiologist your surgery may need to be rescheduled.    _x___ 1. Do not eat food after midnight the night before your procedure. NO GUM OR CANDY AFTER MIDNIGHT. You may drink clear liquids up to 2 hours before you are scheduled to arrive at the hospital for your procedure.  Do not drink clear liquids within 2 hours of your scheduled arrival to the hospital.  Clear liquids include  --Water or Apple juice without pulp  --Gatorade  --Black Coffee or Clear Tea (No milk, no creamers, do not add anything to the coffee or Tea-ok to add sugar)   __x__ 2. No Alcohol for 24 hours before or after surgery.   __x__3. No Smoking or e-cigarettes for 24 prior to surgery.  Do not use any chewable tobacco products for at least 6 hour prior to surgery   ____  4. Bring all medications with you on the day of surgery if instructed.    __x__ 5. Notify your doctor if there is any change in your medical condition     (cold, fever, infections).    x___6. On the morning of surgery brush your teeth with toothpaste and water.  You may rinse your mouth with mouth wash if you wish.  Do not swallow any toothpaste or mouthwash.   Do not wear jewelry, make-up, hairpins, clips or nail polish.  Do not wear lotions, powders, or perfumes. You may wear deodorant.  Do not shave 48 hours prior to surgery. Men may shave face and neck.  Do not bring valuables to the hospital.    [facility] is not responsible for any belongings or valuables.               Contacts, dentures  or bridgework may not be worn into surgery.  Leave your suitcase in the car. After surgery it may be brought to your room.  For patients admitted to the hospital, discharge time is determined by your treatment team.  _  Patients discharged the day of surgery will not be allowed to drive home.  You will need someone to drive you home and stay with you the night of your procedure.    Please read over the following fact sheets that you were given:   [facility] Preparing for Surgery  _x___ TAKE THE FOLLOWING MEDICATION THE MORNING OF SURGERY WITH A SMALL SIP OF WATER. These include:  1. NORVASC (AMLODIPINE)  2. GABAPENTIN (NEURONTIN)  3. CLARITIN (LORATADINE)  4. CRESTOR (ROSUVASTATIN)  5. ZETIA (EZETIMIBE)  6.  ____Fleets enema or Magnesium Citrate as directed.   ____ Use CHG Soap or sage wipes as directed on instruction sheet   ____ Use inhalers on the day of surgery and bring to hospital day of surgery  ____ Stop Metformin and Janumet 2 days prior to surgery.    ____ Take 1/2 of usual insulin dose the night before surgery and none on the morning surgery.   _x___ Follow recommendations from Cardiologist, Pulmonologist or PCP regarding stopping Aspirin, Coumadin, Plavix ,Eliquis, Effient, or Pradaxa, and Pletal-INSTRUCTED BY  OFFICE TO STOP ELIQUIS 3 DAYS PRIOR TO SURGERY (LAST DOSE ON 01-12-20) AND TO RESTART ELIQUIS AFTER SURGERY ON 7-2 (WILL START WITH HIS NIGHT TIME DOSE)  X____Stop Anti-inflammatories such as Advil, Aleve, Ibuprofen, Motrin, Naproxen, Naprosyn, Goodies powders or aspirin products NOW-OK to take Tylenol   ____ Stop supplements until after surgery.   ____ Bring C-Pap to the hospital.

## 2020-01-09 NOTE — Pre-Procedure Instructions (Signed)
Debbe Odea, MD  Physician  Cardiology  Progress Notes  Signed  Encounter Date:  06/05/2019          Signed      Expand All Collapse All  Show:Clear all [x] Manual[x] Template[x] Copied  Added by: [x] , MD  [] Hover for details  Cardiology Office Note:    Date:  06/05/2019   ID:  Debbe Odea, DOB 08-11-1975, MRN 06/07/2019  PCP:  [name], MD      Cardiologist:  13/06/1978, MD  Electrophysiologist:  None   Referring MD: [id number], MD       Chief Complaint  Patient presents with  . OTHER    F/u TEE and zio monitor results being printed. Meds reviewed ver    History of Present Illness:    Jeremy [name] is a 44 y.o. male with a hx of hypertension, recent CVA who presents for follow-up.    Patient originally seen due to some swelling in his legs.  Patient with history of stroke, January 2019, when he was at home and was noted to fall out of his bed.  He was found to have right facial droop and slurred speech.  He was brought to the emergency room to [facility] in [place] by EMS.  He received TPA, after head CT did not reveal any bleeding.  CTA did not show acute infarct.  Subsequently underwent thrombectomy.  Etiology of his stroke remains unclear as TEE attempted in the hospital was unsuccessful.  He was subsequently diagnosed with DVT in the left common femoral vein and started on Eliquis as of March 2019.  Patient is being followed by neurology and evaluation demonstrate immobility of his right leg and right arm.  He was deemed high risk for recurrent DVTs and the neurology service recommended continued Eliquis unless mobility improves.  Denies any history of palpitations, smoking.  He states having normal speech, able to swallow.  He was seen in the clinic and at the time Zio patch was placed to monitor for any evidence of atrial fibrillation.  He now presents for results.  TEE performed in the hospital did not reveal any  PFO.       Past Medical History:  Diagnosis Date  . Asthma    MILD AS A CHILD  . Deviated septum   . Dyspnea    NORMAL SPIROMETRY 02/01/17 VISIT WITH DR HEDRICK  . Hypertension   . Sleep apnea   . Stroke [facility])          Past Surgical History:  Procedure Laterality Date  . CRANIOPLASTY N/A 03/19/2018   Procedure: CRANIOPLASTY;  Surgeon: 02/03/17, MD;  Location: [facility] OR;  Service: Neurosurgery;  Laterality: N/A;  CRANIOPLASTY  . CRANIOTOMY Left 07/29/2017   Procedure: LEFT HEMI- CRANIECTOMY WITH PLACEMENT OF BONE FLAP IN ABDOMEN;  Surgeon: Lisbeth Renshaw, MD;  Location: [facility] OR;  Service: Neurosurgery;  Laterality: Left;  . IR ANGIO INTRA EXTRACRAN SEL COM CAROTID INNOMINATE UNI R MOD SED  07/28/2017  . IR ANGIO VERTEBRAL SEL VERTEBRAL UNI R MOD SED  07/28/2017  . IR CT HEAD LTD  07/28/2017  . IR PERCUTANEOUS ART THROMBECTOMY/INFUSION INTRACRANIAL INC DIAG ANGIO  07/28/2017  . NASAL SEPTOPLASTY W/ TURBINOPLASTY Bilateral 03/06/2017   Procedure: NASAL SEPTOPLASTY WITH TURBINATE REDUCTION;  Surgeon: 09/25/2017, MD;  Location: [facility] ORS;  Service: ENT;  Laterality: Bilateral;  . RADIOLOGY WITH ANESTHESIA N/A 07/27/2017   Procedure: RADIOLOGY WITH ANESTHESIA;  Surgeon: 03/08/2017, MD;  Location: [facility] OR;  Service: Radiology;  Laterality: N/A;  . TEE WITHOUT CARDIOVERSION N/A 04/30/2019   Procedure: TRANSESOPHAGEAL ECHOCARDIOGRAM (TEE);  Surgeon: Debbe Odea, MD;  Location: [facility] ORS;  Service: Cardiovascular;  Laterality: N/A;    Current Medications: Active Medications  Current Meds  Medication Sig  . amLODipine (NORVASC) 10 MG tablet Take 10 mg by mouth daily.  Marland Kitchen ELIQUIS 5 MG TABS tablet Take 5 mg by mouth 2 (two) times daily.   Marland Kitchen ezetimibe (ZETIA) 10 MG tablet Take 10 mg by mouth daily.   Marland Kitchen gabapentin (NEURONTIN) 400 MG capsule TAKE ONE CAPSULE BY MOUTH THREE TIMES A DAY  . loratadine (CLARITIN) 10 MG tablet Take 10 mg by mouth daily.  .  rosuvastatin (CRESTOR) 10 MG tablet Take 10 mg by mouth daily.  . sildenafil (VIAGRA) 50 MG tablet Take by mouth as needed.  Marland Kitchen spironolactone (ALDACTONE) 100 MG tablet Take 100 mg by mouth daily.       Allergies:   Blood-group specific substance and Lipitor [atorvastatin calcium]   Social History        Socioeconomic History  . Marital status: Married    Spouse name: [name]  . Number of children: 2  . Years of education: Not on file  . Highest education level: Not on file  Occupational History  . Not on file  Social Needs  . Financial resource strain: Not very hard  . Food insecurity    Worry: Never true    Inability: Never true  . Transportation needs    Medical: No    Non-medical: No  Tobacco Use  . Smoking status: Never Smoker  . Smokeless tobacco: Never Used  Substance and Sexual Activity  . Alcohol use: Not Currently    Comment: RARE  . Drug use: No  . Sexual activity: Not on file  Lifestyle  . Physical activity    Days per week: Not on file    Minutes per session: Not on file  . Stress: Not on file  Relationships  . Social connections    Talks on phone: More than three times a week    Gets together: Not on file    Attends religious service: Not on file    Active member of club or organization: Not on file    Attends meetings of clubs or organizations: Not on file    Relationship status: Not on file  Other Topics Concern  . Not on file  Social History Narrative  . Not on file     Family History: The patient's family history includes Hypertension in his brother and father.  ROS:   Please see the history of present illness.     All other systems reviewed and are negative.  EKGs/Labs/Other Studies Reviewed:    The following studies were reviewed today:  TEE 04/30/2019 1. Left ventricular ejection fraction, by visual estimation, is 60 to 65%. The left ventricle has normal function. Normal left ventricular size.  There is no left ventricular hypertrophy. 2. Left ventricular diastolic function could not be evaluated pattern of LV diastolic filling. 3. Global right ventricle has normal systolic function.The right ventricular size is normal. Right vetricular wall thickness was not assessed. 4. Left atrial size was normal. 5. Right atrial size was normal. 6. The mitral valve is normal in structure. No evidence of mitral valve regurgitation. 7. The tricuspid valve is normal in structure. Tricuspid valve regurgitation was not visualized by color flow Doppler. 8. The aortic valve is tricuspid Aortic valve regurgitation was not  visualized by color flow Doppler. 9. The pulmonic valve was not well visualized. Pulmonic valve regurgitation is not visualized by color flow Doppler.    Ultrasound venous date 10/05/2017 IMPRESSION: Examination is positive for extensive, predominantly occlusive, DVT extending from the left common femoral vein through the left tibial veins  Ultrasound venous date 10/18/2018 Summary: Left: Findings consistent with chronic deep vein thrombosis involving the left common femoral vein, left femoral vein, and left popliteal vein. The profunda vein is patent. There is spontaneous flow in the IVC and right common iliac vein, external iliac vein. The left common iliac vein appears patent proximally with chronic occlusion distally and in the external iliac vein.   Echo 07/29/2017 Study Conclusions  - Left ventricle: The cavity size was normal. Wall thickness was increased in a pattern of mild LVH. Systolic function was normal.  The estimated ejection fraction was in the range of 60% to 65%. Wall motion was normal; there were no regional wall motion abnormalities. Left ventricular diastolic function parameters were normal. - Atrial septum: No defect or patent foramen ovale was identified.   EKG:  EKG is  ordered today.  The ekg ordered today demonstrates sinus  rhythm, normal ECG.  Recent Labs: No results found for requested labs within last 8760 hours.  Recent Lipid Panel Labs (Brief)          Component Value Date/Time   CHOL 218 (H) 07/28/2017 0259   TRIG 316 (H) 07/30/2017 2323   HDL 28 (L) 07/28/2017 0259   CHOLHDL 7.8 07/28/2017 0259   VLDL UNABLE TO CALCULATE IF TRIGLYCERIDE OVER 400 mg/dL 07/28/2017 0259   LDLCALC UNABLE TO CALCULATE IF TRIGLYCERIDE OVER 400 mg/dL 07/28/2017 0259      Physical Exam:    VS:  BP 128/88 (BP Location: Left Arm, Patient Position: Sitting, Cuff Size: Normal)   Pulse 88   Ht 5\' 11"  (1.803 m)   Wt 214 lb 4 oz (97.2 kg)   SpO2 98%   BMI 29.88 kg/m        Wt Readings from Last 3 Encounters:  06/05/19 214 lb 4 oz (97.2 kg)  04/30/19 212 lb 1.3 oz (96.2 kg)  04/25/19 212 lb (96.2 kg)     GEN:  Well nourished, well developed in no acute distress, patient sitting in wheelchair. HEENT: Normal NECK: No JVD; No carotid bruits LYMPHATICS: No lymphadenopathy CARDIAC: RRR, no murmurs, rubs, gallops RESPIRATORY:  Clear to auscultation without rales, wheezing or rhonchi  ABDOMEN: Soft, non-tender, non-distended MUSCULOSKELETAL: Right arm weakness noted, right leg in brace. SKIN: Warm and dry NEUROLOGIC:  Alert and oriented x 3 PSYCHIATRIC:  Normal affect   ASSESSMENT:   Patient with history of stroke and DVT.  Not sure of etiology.  No evidence of PFO or ASD present on TEE.  2-week cardiac monitor did not reveal any evidence for atrial fibrillation or other arrhythmias.  Predominant underlying rhythm was sinus. 1. Essential hypertension   2. Cryptogenic stroke ([place])   3. Deep vein thrombosis (DVT) of left lower extremity, unspecified chronicity, unspecified vein (HCC)    PLAN:    In order of problems listed above:  1. Blood pressure well controlled.  Continue Norvasc. 2. No evidence of PFO noted on TEE.  No evidence of A. fib/a flutter on cardiac monitor.  Patient made aware of  results. 3. Continue Eliquis due to history of DVT.  Follow-up as needed.   Medication Adjustments/Labs and Tests Ordered: Current medicines are reviewed at length with the  patient today.  Concerns regarding medicines are outlined above.     Orders Placed This Encounter  Procedures  . EKG 12-Lead   No orders of the defined types were placed in this encounter.   Patient Instructions  Medication Instructions:  None *If you need a refill on your cardiac medications before your next appointment, please call your pharmacy*  Lab Work: none If you have labs (blood work) drawn today and your tests are completely normal, you will receive your results only by:  MyChart Message (if you have MyChart) OR  A paper copy in the mail If you have any lab test that is abnormal or we need to change your treatment, we will call you to review the results.  Testing/Procedures: None  Follow-Up: At [facility], you and your health needs are our priority. As part of our continuing mission to provide you with exceptional heart care, we have created designated Provider Care Teams. These Care Teams include your primary Cardiologist (physician) and Advanced Practice Providers (APPs -  Physician Assistants and Nurse Practitioners) who all work together to provide you with the care you need, when you need it.  Your next appointment:   As needed     Signed, Debbe Odea, MD  06/05/2019 11:07 AM    [facility]        Electronically signed by Debbe Odea, MD at 06/05/2019 11:08 AM  Office Visit on 06/05/2019   Office Visit on 06/05/2019     Detailed Report

## 2020-01-11 LAB — CULTURE, URINE COMPREHENSIVE

## 2020-01-12 ENCOUNTER — Encounter
Admission: RE | Admit: 2020-01-12 | Discharge: 2020-01-12 | Disposition: A | Discharge status: Home / Self Care | Payer: 59 | Source: Ambulatory Visit | Attending: Urology | Admitting: Urology

## 2020-01-12 ENCOUNTER — Other Ambulatory Visit: Payer: Self-pay

## 2020-01-12 DIAGNOSIS — Z01818 Encounter for other preprocedural examination: Secondary | ICD-10-CM | POA: Insufficient documentation

## 2020-01-12 LAB — COMPREHENSIVE METABOLIC PANEL
ALT: 123 U/L — ABNORMAL HIGH (ref 0–44)
AST: 54 U/L — ABNORMAL HIGH (ref 15–41)
Albumin: 4.7 g/dL (ref 3.5–5.0)
Alkaline Phosphatase: 45 U/L (ref 38–126)
Anion gap: 12 (ref 5–15)
BUN: 13 mg/dL (ref 6–20)
CO2: 26 mmol/L (ref 22–32)
Calcium: 9.5 mg/dL (ref 8.9–10.3)
Chloride: 100 mmol/L (ref 98–111)
Creatinine, Ser: 0.94 mg/dL (ref 0.61–1.24)
GFR calc Af Amer: >60 mL/min (ref >60)
GFR calc non Af Amer: >60 mL/min (ref >60)
Glucose, Bld: 94 mg/dL (ref 70–99)
Potassium: 4 mmol/L (ref 3.5–5.1)
Sodium: 138 mmol/L (ref 135–145)
Total Bilirubin: 0.9 mg/dL (ref 0.3–1.2)
Total Protein: 8.1 g/dL (ref 6.5–8.1)

## 2020-01-12 LAB — CBC
HCT: 42.4 % (ref 39.0–52.0)
Hemoglobin: 14.8 g/dL (ref 13.0–17.0)
MCH: 31.5 pg (ref 26.0–34.0)
MCHC: 34.9 g/dL (ref 30.0–36.0)
MCV: 90.2 fL (ref 80.0–100.0)
Platelets: 303 10*3/uL (ref 150–400)
RBC: 4.7 MIL/uL (ref 4.22–5.81)
RDW: 12.6 % (ref 11.5–15.5)
WBC: 6.4 10*3/uL (ref 4.0–10.5)
nRBC: 0 % (ref 0.0–0.2)

## 2020-01-13 NOTE — Progress Notes (Signed)
  [facility] Perioperative Services: Pre-Admission/Anesthesia Testing  Abnormal Lab Notification  Date: 01/13/20  Name: Jeremy [name] MRN:   [id number]  Re: Abnormal labs noted during PAT appointment  Provider Notified: Legrand Rams, MD Notification mode: Routed via [facility]  Labs of concern:  AST 54 U/L  ALT 123 U/L  ALP and total bilirubin normal.  Discussion:  Last LFTs on 10/04/2019 as follows: AST 41, ALT 100, ALP 62, and total bilirubin 1.2.   Quentin Mulling, MSN, APRN, FNP-C, CEN [facility]  Peri-operative Services Nurse Practitioner Phone: [phone] 01/13/20 8:13 AM

## 2020-01-14 ENCOUNTER — Other Ambulatory Visit
Admission: RE | Admit: 2020-01-14 | Discharge: 2020-01-14 | Disposition: A | Discharge status: Home / Self Care | Payer: 59 | Source: Ambulatory Visit | Attending: Urology | Admitting: Urology

## 2020-01-14 ENCOUNTER — Other Ambulatory Visit: Payer: Self-pay

## 2020-01-14 DIAGNOSIS — Z01812 Encounter for preprocedural laboratory examination: Secondary | ICD-10-CM | POA: Diagnosis present

## 2020-01-14 DIAGNOSIS — Z20822 Contact with and (suspected) exposure to covid-19: Secondary | ICD-10-CM | POA: Insufficient documentation

## 2020-01-14 LAB — SARS CORONAVIRUS 2 (TAT 6-24 HRS): SARS Coronavirus 2: NEGATIVE

## 2020-01-16 ENCOUNTER — Ambulatory Visit: Payer: Medicare HMO | Admitting: Anesthesiology

## 2020-01-16 ENCOUNTER — Encounter: Payer: Self-pay | Admitting: Urology

## 2020-01-16 ENCOUNTER — Ambulatory Visit
Admission: RE | Admit: 2020-01-16 | Discharge: 2020-01-16 | Disposition: A | Discharge status: Home / Self Care | Payer: Medicare HMO | Attending: Urology | Admitting: Urology

## 2020-01-16 ENCOUNTER — Encounter
Admission: RE | Disposition: A | Discharge status: Home / Self Care | Payer: Self-pay | Source: Home / Self Care | Attending: Urology

## 2020-01-16 ENCOUNTER — Other Ambulatory Visit: Payer: Self-pay

## 2020-01-16 ENCOUNTER — Ambulatory Visit: Payer: Medicare HMO

## 2020-01-16 DIAGNOSIS — I69351 Hemiplegia and hemiparesis following cerebral infarction affecting right dominant side: Secondary | ICD-10-CM | POA: Diagnosis not present

## 2020-01-16 DIAGNOSIS — I69398 Other sequelae of cerebral infarction: Secondary | ICD-10-CM | POA: Insufficient documentation

## 2020-01-16 DIAGNOSIS — E785 Hyperlipidemia, unspecified: Secondary | ICD-10-CM | POA: Diagnosis not present

## 2020-01-16 DIAGNOSIS — K219 Gastro-esophageal reflux disease without esophagitis: Secondary | ICD-10-CM | POA: Diagnosis not present

## 2020-01-16 DIAGNOSIS — N201 Calculus of ureter: Secondary | ICD-10-CM

## 2020-01-16 DIAGNOSIS — G4730 Sleep apnea, unspecified: Secondary | ICD-10-CM | POA: Diagnosis not present

## 2020-01-16 DIAGNOSIS — I10 Essential (primary) hypertension: Secondary | ICD-10-CM | POA: Insufficient documentation

## 2020-01-16 DIAGNOSIS — R519 Headache, unspecified: Secondary | ICD-10-CM | POA: Insufficient documentation

## 2020-01-16 DIAGNOSIS — J342 Deviated nasal septum: Secondary | ICD-10-CM | POA: Diagnosis not present

## 2020-01-16 DIAGNOSIS — Z7901 Long term (current) use of anticoagulants: Secondary | ICD-10-CM | POA: Insufficient documentation

## 2020-01-16 DIAGNOSIS — R748 Abnormal levels of other serum enzymes: Secondary | ICD-10-CM | POA: Diagnosis not present

## 2020-01-16 DIAGNOSIS — Z8249 Family history of ischemic heart disease and other diseases of the circulatory system: Secondary | ICD-10-CM | POA: Insufficient documentation

## 2020-01-16 HISTORY — PX: CYSTOSCOPY/URETEROSCOPY/HOLMIUM LASER/STENT PLACEMENT: SHX6546

## 2020-01-16 HISTORY — PX: CYSTOSCOPY W/ RETROGRADES: SHX1426

## 2020-01-16 SURGERY — CYSTOSCOPY/URETEROSCOPY/HOLMIUM LASER/STENT PLACEMENT
Anesthesia: General | Laterality: Left

## 2020-01-16 MED ORDER — PROPOFOL 10 MG/ML IV BOLUS
INTRAVENOUS | Status: DC | PRN
  Administered 2020-01-16: 150 mg via INTRAVENOUS

## 2020-01-16 MED ORDER — FENTANYL CITRATE (PF) 100 MCG/2ML IJ SOLN
INTRAMUSCULAR | Status: AC
  Filled 2020-01-16: qty 2

## 2020-01-16 MED ORDER — FENTANYL CITRATE (PF) 100 MCG/2ML IJ SOLN
INTRAMUSCULAR | Status: DC | PRN
  Administered 2020-01-16 (×2): 50 ug via INTRAVENOUS

## 2020-01-16 MED ORDER — PROPOFOL 10 MG/ML IV BOLUS
INTRAVENOUS | Status: AC
  Filled 2020-01-16: qty 20

## 2020-01-16 MED ORDER — ORAL CARE MOUTH RINSE: 15.0000 mL | Freq: Once | OROMUCOSAL | Status: AC

## 2020-01-16 MED ORDER — MIDAZOLAM HCL 2 MG/2ML IJ SOLN
INTRAMUSCULAR | Status: AC
  Filled 2020-01-16: qty 2

## 2020-01-16 MED ORDER — CHLORHEXIDINE GLUCONATE 0.12 % MT SOLN
OROMUCOSAL | Status: AC
  Filled 2020-01-16: qty 15

## 2020-01-16 MED ORDER — LABETALOL HCL 5 MG/ML IV SOLN
INTRAVENOUS | Status: AC
  Filled 2020-01-16: qty 4

## 2020-01-16 MED ORDER — LIDOCAINE HCL (PF) 2 % IJ SOLN
INTRAMUSCULAR | Status: AC
  Filled 2020-01-16: qty 5

## 2020-01-16 MED ORDER — MIDAZOLAM HCL 2 MG/2ML IJ SOLN
INTRAMUSCULAR | Status: DC | PRN
  Administered 2020-01-16: 2 mg via INTRAVENOUS

## 2020-01-16 MED ORDER — ROCURONIUM BROMIDE 10 MG/ML (PF) SYRINGE
PREFILLED_SYRINGE | INTRAVENOUS | Status: AC
  Filled 2020-01-16: qty 10

## 2020-01-16 MED ORDER — LABETALOL HCL 5 MG/ML IV SOLN
10.0000 mg | Freq: Once | INTRAVENOUS | Status: AC
  Administered 2020-01-16: 10 mg via INTRAVENOUS

## 2020-01-16 MED ORDER — OXYCODONE HCL 5 MG/5ML PO SOLN: 5.0000 mg | Freq: Once | ORAL | Status: DC | PRN

## 2020-01-16 MED ORDER — ROCURONIUM BROMIDE 100 MG/10ML IV SOLN
INTRAVENOUS | Status: DC | PRN
  Administered 2020-01-16: 50 mg via INTRAVENOUS
  Administered 2020-01-16: 10 mg via INTRAVENOUS

## 2020-01-16 MED ORDER — CEFAZOLIN SODIUM-DEXTROSE 2-4 GM/100ML-% IV SOLN
2.0000 g | INTRAVENOUS | Status: AC
  Administered 2020-01-16: 2 g via INTRAVENOUS

## 2020-01-16 MED ORDER — LACTATED RINGERS IV SOLN: INTRAVENOUS | Status: DC

## 2020-01-16 MED ORDER — FENTANYL CITRATE (PF) 100 MCG/2ML IJ SOLN: 25.0000 ug | INTRAMUSCULAR | Status: DC | PRN

## 2020-01-16 MED ORDER — FAMOTIDINE 20 MG PO TABS
ORAL_TABLET | ORAL | Status: AC
  Administered 2020-01-16: 20 mg via ORAL
  Filled 2020-01-16: qty 1

## 2020-01-16 MED ORDER — CHLORHEXIDINE GLUCONATE 0.12 % MT SOLN
15.0000 mL | Freq: Once | OROMUCOSAL | Status: AC
  Administered 2020-01-16: 15 mL via OROMUCOSAL

## 2020-01-16 MED ORDER — LIDOCAINE HCL (CARDIAC) PF 100 MG/5ML IV SOSY
PREFILLED_SYRINGE | INTRAVENOUS | Status: DC | PRN
  Administered 2020-01-16: 60 mg via INTRAVENOUS

## 2020-01-16 MED ORDER — OXYCODONE HCL 5 MG PO TABS: 5.0000 mg | ORAL_TABLET | Freq: Once | ORAL | Status: DC | PRN

## 2020-01-16 MED ORDER — CEFAZOLIN SODIUM-DEXTROSE 2-4 GM/100ML-% IV SOLN
INTRAVENOUS | Status: AC
  Filled 2020-01-16: qty 100

## 2020-01-16 MED ORDER — DEXAMETHASONE SODIUM PHOSPHATE 10 MG/ML IJ SOLN
INTRAMUSCULAR | Status: DC | PRN
  Administered 2020-01-16: 5 mg via INTRAVENOUS

## 2020-01-16 MED ORDER — FAMOTIDINE 20 MG PO TABS: 20.0000 mg | ORAL_TABLET | Freq: Once | ORAL | Status: AC

## 2020-01-16 MED ORDER — SUGAMMADEX SODIUM 500 MG/5ML IV SOLN
INTRAVENOUS | Status: DC | PRN
  Administered 2020-01-16: 400 mg via INTRAVENOUS

## 2020-01-16 MED ORDER — IOHEXOL 180 MG/ML  SOLN
INTRAMUSCULAR | Status: DC | PRN
  Administered 2020-01-16: 15 mL

## 2020-01-16 SURGICAL SUPPLY — 31 items
BAG DRAIN CYSTO-URO LG1000N (MISCELLANEOUS) ×2 IMPLANT
BRUSH SCRUB EZ 1% IODOPHOR (MISCELLANEOUS) ×2 IMPLANT
CATH URETL 5X70 OPEN END (CATHETERS) ×2 IMPLANT
CNTNR SPEC 2.5X3XGRAD LEK (MISCELLANEOUS)
CONT SPEC 4OZ STER OR WHT (MISCELLANEOUS)
CONTAINER SPEC 2.5X3XGRAD LEK (MISCELLANEOUS) IMPLANT
DRAPE UTILITY 15X26 TOWEL STRL (DRAPES) ×2 IMPLANT
FIBER LASER TRAC TIP (UROLOGICAL SUPPLIES) IMPLANT
GLOVE BIOGEL PI IND STRL 7.5 (GLOVE) ×1 IMPLANT
GLOVE BIOGEL PI INDICATOR 7.5 (GLOVE) ×1
GOWN STRL REUS W/ TWL LRG LVL3 (GOWN DISPOSABLE) ×1 IMPLANT
GOWN STRL REUS W/ TWL XL LVL3 (GOWN DISPOSABLE) ×1 IMPLANT
GOWN STRL REUS W/TWL LRG LVL3 (GOWN DISPOSABLE) ×1
GOWN STRL REUS W/TWL XL LVL3 (GOWN DISPOSABLE) ×1
GUIDEWIRE STR DUAL SENSOR (WIRE) ×2 IMPLANT
INFUSOR MANOMETER BAG 3000ML (MISCELLANEOUS) ×2 IMPLANT
INTRODUCER DILATOR DOUBLE (INTRODUCER) IMPLANT
KIT TURNOVER CYSTO (KITS) ×2 IMPLANT
PACK CYSTO AR (MISCELLANEOUS) ×2 IMPLANT
SET CYSTO W/LG BORE CLAMP LF (SET/KITS/TRAYS/PACK) ×2 IMPLANT
SHEATH URETERAL 12FRX35CM (MISCELLANEOUS) IMPLANT
SOL .9 NS 3000ML IRR  AL (IV SOLUTION) ×1
SOL .9 NS 3000ML IRR UROMATIC (IV SOLUTION) ×1 IMPLANT
STENT URET 6FRX24 CONTOUR (STENTS) IMPLANT
STENT URET 6FRX26 CONTOUR (STENTS) IMPLANT
STENT URET 6FRX28 CONTOUR (STENTS) ×2 IMPLANT
STRIP CLOSURE SKIN 1/2X4 (GAUZE/BANDAGES/DRESSINGS) ×2 IMPLANT
SURGILUBE 2OZ TUBE FLIPTOP (MISCELLANEOUS) ×2 IMPLANT
SYR 10ML LL (SYRINGE) ×2 IMPLANT
VALVE UROSEAL ADJ ENDO (VALVE) ×2 IMPLANT
WATER STERILE IRR 1000ML POUR (IV SOLUTION) ×2 IMPLANT

## 2020-01-16 NOTE — Op Note (Signed)
Date of procedure: 01/16/20  Preoperative diagnosis:  1. Left 7 mm distal ureteral stone  Postoperative diagnosis:  1. Stone spontaneously passed  Procedure: 1. Cystoscopy, left retrograde pyelogram with intraoperative interpretation, left diagnostic ureteroscopy, left ureteral stent placement on Dangler  Surgeon: Legrand Rams, MD  Anesthesia: General  Complications: None  Intraoperative findings:  1.  High bladder neck, ureteral orifices orthotopic, bladder otherwise normal 2.  Diagnostic left ureteroscopy with completely normal ureter and no stones, no stone visualized in kidney 3.  Uncomplicated stent placement on Dangler  EBL: Minimal  Specimens: None  Drains: Left 6 French by 28 cm ureteral stent on Dangler  Indication: Jeremy [name] is a 44 y.o. patient originally diagnosed with a 7 mm left proximal ureteral stone in February 2021, but did not follow-up in urology clinic until May.  At that time a KUB showed that stone had migrated to the left distal ureter.  He continued to have left-sided flank pain, and opted for ureteroscopy.  After reviewing the management options for treatment, they elected to proceed with the above surgical procedure(s). We have discussed the potential benefits and risks of the procedure, side effects of the proposed treatment, the likelihood of the patient achieving the goals of the procedure, and any potential problems that might occur during the procedure or recuperation. Informed consent has been obtained.  Description of procedure:  The patient was taken to the operating room and general anesthesia was induced. SCDs were placed for DVT prophylaxis. The patient was placed in the dorsal lithotomy position, prepped and draped in the usual sterile fashion, and preoperative antibiotics were administered. A preoperative time-out was performed.   A 21 French rigid cystoscope was used to intubate the urethra and a normal-appearing urethra was followed  proximally to the bladder.  Prostate was small with a high bladder neck.  Thorough cystoscopy revealed no abnormal bladder findings, and the ureteral orifices were orthotopic bilaterally.  I started by performing a retrograde pyelogram on the left side via a 5 French access catheter, and there were no obvious filling defects or hydronephrosis noted.  A sensor wire advanced easily up to the kidney.  I performed semirigid left ureteroscopy which showed a normal distal and mid ureter no stone or other abnormalities.  At this point, I advanced the single channel flexible ureteroscope over the wire and passed easily up to the upper pole.  Thorough pyeloscopy revealed no stones, and I could not visualize the previously seen 5 mm upper pole stone from CT in February.  Careful pullback ureteroscopy revealed no ureteral injury or stone pieces.  A 6 French by 28 cm ureteral stent was uneventfully placed with an excellent curl in the upper pole as well as in the bladder.  The bladder was drained, and the stent Dangler was secured to the penis with Steri-Strips.  Disposition: Stable to PACU  Plan: Okay to remove stent at home on Sunday morning Follow-up with urology as needed  Legrand Rams, MD

## 2020-01-16 NOTE — Anesthesia Preprocedure Evaluation (Addendum)
Anesthesia Evaluation  Patient identified by MRN, date of birth, ID band Patient awake    Reviewed: Allergy & Precautions, H&P , NPO status , Patient's Chart, lab work & pertinent test results  History of Anesthesia Complications Negative for: history of anesthetic complications  Airway Mallampati: III  TM Distance: <3 FB Neck ROM: full    Dental  (+) Chipped   Pulmonary neg shortness of breath, sleep apnea ,    Pulmonary exam normal        Cardiovascular Exercise Tolerance: Good hypertension, (-) angina(-) Past MI and (-) DOE Normal cardiovascular exam     Neuro/Psych  Headaches, PSYCHIATRIC DISORDERS CVA, Residual Symptoms negative psych ROS   GI/Hepatic negative GI ROS, Neg liver ROS, neg GERD  ,  Endo/Other  negative endocrine ROS  Renal/GU      Musculoskeletal   Abdominal   Peds  Hematology  (+) JEHOVAH'S WITNESS  Anesthesia Other Findings Past Medical History: No date: Deviated septum No date: Elevated liver enzymes     Comment:  SEEN ON LABS FROM CARE EVERYWHERE IN [facility] No date: History of kidney stones No date: Hypertension No date: Sleep apnea     Comment:  does not use cpap 07/2017: Stroke (HCC)     Comment:  RESIUAL EFFECTS OF RIGHT SIDED WEAKNESS (ARM)  Past Surgical History: 03/19/2018: Rebecka Apley; N/A     Comment:  Procedure: CRANIOPLASTY;  Surgeon: Lisbeth Renshaw,               MD;  Location: [facility] OR;  Service: Neurosurgery;                Laterality: N/A;  CRANIOPLASTY 07/29/2017: CRANIOTOMY; Left     Comment:  Procedure: LEFT HEMI- CRANIECTOMY WITH PLACEMENT OF BONE              FLAP IN ABDOMEN;  Surgeon: Lisbeth Renshaw, MD;                Location: [facility] OR;  Service: Neurosurgery;  Laterality:               Left; 07/28/2017: IR ANGIO INTRA EXTRACRAN SEL COM CAROTID INNOMINATE UNI R  MOD SED 07/28/2017: IR ANGIO VERTEBRAL SEL VERTEBRAL UNI R MOD SED 07/28/2017: IR CT HEAD  LTD 07/28/2017: IR PERCUTANEOUS ART THROMBECTOMY/INFUSION INTRACRANIAL INC  DIAG ANGIO 03/06/2017: NASAL SEPTOPLASTY W/ TURBINOPLASTY; Bilateral     Comment:  Procedure: NASAL SEPTOPLASTY WITH TURBINATE REDUCTION;                Surgeon: Linus Salmons, MD;  Location: [facility] ORS;                Service: ENT;  Laterality: Bilateral; 07/27/2017: RADIOLOGY WITH ANESTHESIA; N/A     Comment:  Procedure: RADIOLOGY WITH ANESTHESIA;  Surgeon:               Julieanne Cotton, MD;  Location: [facility] OR;  Service:               Radiology;  Laterality: N/A; 04/30/2019: TEE WITHOUT CARDIOVERSION; N/A     Comment:  Procedure: TRANSESOPHAGEAL ECHOCARDIOGRAM (TEE);                Surgeon: Debbe Odea, MD;  Location: [facility] ORS;                Service: Cardiovascular;  Laterality: N/A;     Reproductive/Obstetrics negative OB ROS  Anesthesia Physical Anesthesia Plan  ASA: III  Anesthesia Plan: General ETT   Post-op Pain Management:    Induction: Intravenous  PONV Risk Score and Plan: Ondansetron, Dexamethasone, Midazolam and Treatment may vary due to age or medical condition  Airway Management Planned: Oral ETT  Additional Equipment:   Intra-op Plan:   Post-operative Plan: Extubation in OR  Informed Consent: I have reviewed the patients History and Physical, chart, labs and discussed the procedure including the risks, benefits and alternatives for the proposed anesthesia with the patient or authorized representative who has indicated his/her understanding and acceptance.     Dental Advisory Given  Plan Discussed with: Anesthesiologist, CRNA and Surgeon  Anesthesia Plan Comments: (Patient consented for risks of anesthesia including but not limited to:  - adverse reactions to medications - damage to eyes, teeth, lips or other oral mucosa - nerve damage due to positioning  - sore throat or hoarseness - Damage to heart, brain, nerves,  lungs, other parts of body or loss of life  Patient voiced understanding.)        Anesthesia Quick Evaluation

## 2020-01-16 NOTE — Anesthesia Procedure Notes (Signed)
Procedure Name: Intubation Date/Time: 01/16/2020 10:43 AM Performed by: Milagros Reap, CRNA Pre-anesthesia Checklist: Patient identified, Emergency Drugs available, Suction available, Patient being monitored and Timeout performed Patient Re-evaluated:Patient Re-evaluated prior to induction Oxygen Delivery Method: Circle system utilized Preoxygenation: Pre-oxygenation with 100% oxygen Induction Type: IV induction Ventilation: Mask ventilation without difficulty and Oral airway inserted - appropriate to patient size Laryngoscope Size: McGraph and 4 Grade View: Grade I Tube type: Oral Tube size: 7.5 mm Number of attempts: 1 Airway Equipment and Method: Stylet and Video-laryngoscopy Placement Confirmation: positive ETCO2 and breath sounds checked- equal and bilateral Secured at: 22 (lip) cm Tube secured with: Tape Dental Injury: Teeth and Oropharynx as per pre-operative assessment

## 2020-01-16 NOTE — H&P (Signed)
01/16/20 10:12 AM   Jeremy [name] [birth year] [id number]  CC: left flank pain  HPI:  He is a 44 year old male who had a significant stroke in January 2019 with residual right hemiparesis and spasticity.  He was seen in the ER with left-sided flank pain all the way back in February 2021 and CT showed a left 7 mm proximal ureteral stone.  Urinalysis was non-infected and he was discharged with medical expulsive therapy.  He did not follow up. He reports he has had ongoing intermittent left-sided flank and left abdominal pain.  He denies any fevers or chills or urinary symptoms.  He denies any gross hematuria.  He has always passed stones spontaneously and has never required surgery.  He is anticoagulated with Eliquis at baseline with his history of stroke.   KUB 5/20 suggested migration of the 7 mm calculus to the left distal ureter.  He continues to have left-sided flank pain.  He is opted for ureteroscopy for treatment.  PMH: Past Medical History:  Diagnosis Date  . Deviated septum   . Elevated liver enzymes    SEEN ON LABS FROM CARE EVERYWHERE IN [facility]  . History of kidney stones   . Hypertension   . Sleep apnea    does not use cpap  . Stroke (HCC) 07/2017   RESIUAL EFFECTS OF RIGHT SIDED WEAKNESS (ARM)    Surgical History: Past Surgical History:  Procedure Laterality Date  . CRANIOPLASTY N/A 03/19/2018   Procedure: CRANIOPLASTY;  Surgeon: Lisbeth Renshaw, MD;  Location: [facility] OR;  Service: Neurosurgery;  Laterality: N/A;  CRANIOPLASTY  . CRANIOTOMY Left 07/29/2017   Procedure: LEFT HEMI- CRANIECTOMY WITH PLACEMENT OF BONE FLAP IN ABDOMEN;  Surgeon: Lisbeth Renshaw, MD;  Location: [facility] OR;  Service: Neurosurgery;  Laterality: Left;  . IR ANGIO INTRA EXTRACRAN SEL COM CAROTID INNOMINATE UNI R MOD SED  07/28/2017  . IR ANGIO VERTEBRAL SEL VERTEBRAL UNI R MOD SED  07/28/2017  . IR CT HEAD LTD  07/28/2017  . IR PERCUTANEOUS ART THROMBECTOMY/INFUSION INTRACRANIAL INC DIAG ANGIO   07/28/2017  . NASAL SEPTOPLASTY W/ TURBINOPLASTY Bilateral 03/06/2017   Procedure: NASAL SEPTOPLASTY WITH TURBINATE REDUCTION;  Surgeon: Linus Salmons, MD;  Location: [facility] ORS;  Service: ENT;  Laterality: Bilateral;  . RADIOLOGY WITH ANESTHESIA N/A 07/27/2017   Procedure: RADIOLOGY WITH ANESTHESIA;  Surgeon: Julieanne Cotton, MD;  Location: [facility] OR;  Service: Radiology;  Laterality: N/A;  . TEE WITHOUT CARDIOVERSION N/A 04/30/2019   Procedure: TRANSESOPHAGEAL ECHOCARDIOGRAM (TEE);  Surgeon: Debbe Odea, MD;  Location: [facility] ORS;  Service: Cardiovascular;  Laterality: N/A;    Family History: Family History  Problem Relation Age of Onset  . Hypertension Father   . Hypertension Brother     Social History:  reports that he has never smoked. He has never used smokeless tobacco. He reports previous alcohol use. He reports that he does not use drugs.  Physical Exam: BP 136/89   Pulse 87   Temp (!) 97.1 F (36.2 C) (Tympanic)   SpO2 97%    Constitutional:  Alert and oriented, No acute distress. Cardiovascular: RRR Respiratory: cta bilaterally GI: Abdomen is soft, nontender, nondistended, no abdominal masses  Laboratory Data: Urine culture 6/24 7k mixed flora  Pertinent Imaging: Reviewed, see HPI  Assessment & Plan:   44 year old comorbid male with left 7 mm ureteral stone and 5 mm upper pole stone, present since February 2021 who has delayed treatment until this time.  He continues to have flank pain.  We  specifically discussed the risks ureteroscopy including bleeding, infection/sepsis, stent related symptoms including flank pain/urgency/frequency/incontinence/dysuria, ureteral injury, inability to access stone, or need for staged or additional procedures.  Left ureteroscopy, laser lithotripsy, stent placement  Legrand Rams, MD 01/16/2020  [facility] [address], [address] [place], [place] [address] [phone]

## 2020-01-16 NOTE — Transfer of Care (Signed)
Immediate Anesthesia Transfer of Care Note  Patient: Geronimo Graser  Procedure(s) Performed: CYSTOSCOPY/DIAGNOSTIC URETEROSCOPY/STENT PLACEMENT (Left ) CYSTOSCOPY WITH RETROGRADE PYELOGRAM (Left )  Patient Location: PACU  Anesthesia Type:General  Level of Consciousness: awake, alert  and oriented  Airway & Oxygen Therapy: Patient Spontanous Breathing and Patient connected to face mask oxygen  Post-op Assessment: Report given to RN and Post -op Vital signs reviewed and stable  Post vital signs: Reviewed and stable  Last Vitals:  Vitals Value Taken Time  BP 130/93 01/16/20 1125  Temp 36.3 C 01/16/20 1125  Pulse 97 01/16/20 1129  Resp 15 01/16/20 1129  SpO2 100 % 01/16/20 1129  Vitals shown include unvalidated device data.  Last Pain:  Vitals:   01/16/20 0923  TempSrc: Tympanic  PainSc: 0-No pain         Complications: No complications documented.

## 2020-01-17 ENCOUNTER — Encounter: Payer: Self-pay | Admitting: Urology

## 2020-01-20 NOTE — Anesthesia Postprocedure Evaluation (Signed)
Anesthesia Post Note  Patient: Jeremy [name]  Procedure(s) Performed: CYSTOSCOPY/DIAGNOSTIC URETEROSCOPY/STENT PLACEMENT (Left ) CYSTOSCOPY WITH RETROGRADE PYELOGRAM (Left )  Patient location during evaluation: PACU Anesthesia Type: General Level of consciousness: awake and alert and oriented Pain management: pain level controlled Vital Signs Assessment: post-procedure vital signs reviewed and stable Respiratory status: spontaneous breathing Cardiovascular status: blood pressure returned to baseline Anesthetic complications: no   No complications documented.   Last Vitals:  Vitals:   01/16/20 1220 01/16/20 1232  BP: (!) 134/96 (!) 141/97  Pulse: 83 86  Resp: 15 16  Temp: (!) 36.1 C 36.6 C  SpO2: 98% 96%    Last Pain:  Vitals:   01/16/20 1232  TempSrc: Oral  PainSc: 3                  Shaquisha Wynn

## 2020-01-26 ENCOUNTER — Other Ambulatory Visit: Payer: Self-pay

## 2020-01-26 ENCOUNTER — Encounter: Payer: Medicare HMO | Attending: Physical Medicine & Rehabilitation | Admitting: Psychology

## 2020-01-26 DIAGNOSIS — G4730 Sleep apnea, unspecified: Secondary | ICD-10-CM | POA: Diagnosis not present

## 2020-01-26 DIAGNOSIS — Z8249 Family history of ischemic heart disease and other diseases of the circulatory system: Secondary | ICD-10-CM | POA: Diagnosis not present

## 2020-01-26 DIAGNOSIS — F4323 Adjustment disorder with mixed anxiety and depressed mood: Secondary | ICD-10-CM

## 2020-01-26 DIAGNOSIS — I639 Cerebral infarction, unspecified: Secondary | ICD-10-CM | POA: Diagnosis not present

## 2020-01-26 DIAGNOSIS — I69351 Hemiplegia and hemiparesis following cerebral infarction affecting right dominant side: Secondary | ICD-10-CM | POA: Insufficient documentation

## 2020-01-26 DIAGNOSIS — G8110 Spastic hemiplegia affecting unspecified side: Secondary | ICD-10-CM | POA: Diagnosis not present

## 2020-01-26 DIAGNOSIS — I10 Essential (primary) hypertension: Secondary | ICD-10-CM | POA: Insufficient documentation

## 2020-01-26 DIAGNOSIS — I69319 Unspecified symptoms and signs involving cognitive functions following cerebral infarction: Secondary | ICD-10-CM

## 2020-01-26 DIAGNOSIS — R4701 Aphasia: Secondary | ICD-10-CM

## 2020-01-26 DIAGNOSIS — I69314 Frontal lobe and executive function deficit following cerebral infarction: Secondary | ICD-10-CM | POA: Diagnosis not present

## 2020-01-27 ENCOUNTER — Encounter: Payer: Self-pay | Admitting: Psychology

## 2020-01-27 NOTE — Progress Notes (Signed)
Neuropsychology Visit  Patient:  Jeremy [name]   DOB: 06-Jul-1976  MR Number: [id number]  Location: [facility]ICINE AND [facility]HABILITATION [address] [place], [id number] [facility] [place] [address] Dept: [phone]  Date of Service: 01/26/2020   Start: 1 PM End: 2 PM  Duration of Service: 1 Hour Today's visit was an in person visit that was conducted with the patient, his wife and myself in my outpatient clinic office.    Provider/Observer:     Jeremy Coria PsyD  Chief Complaint:      Chief Complaint  Patient presents with  . Depression  . Anxiety  . Agitation    Reason For Service:     Jeremy [name] is a 44 year old male referred by Dr. Wynn [name] for neuropsychological consultation.  The patient was admitted to the hospital after he presented on 07/27/2006 with right-sided weakness, headache, facial droop and slurred speech.  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  There was also cerebral edema with midline shift visualized.  A decompressive hemicraniotomy was performed.  The patient had significant aphasia/expressive language symptoms and near complete right arm and right leg motor impairments.  There was a great deal of impulse control that was almost completely verbal in nature initially.  The patient was initially verbally resistant and negative interactions during some therapy sessions were noted.  Receptive language functions did appear to be intact.  There was apparent significant depression and anxiety symptoms due to sudden loss of function.  The patient was treated in the comprehensive inpatient rehabilitation program.  During that time I had the opportunity to work with the patient's 3 times that this is a follow-up from that 2019.    The above reason for service has been reviewed for this appointment remains applicable for the current visit.  The  patient has made significant improvements in self-directed drive and motivation.  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  The above reason for service has been reviewed for this appointment remains applicable for the current visit.  The patient continues to have significant motivation issues but they are improving overall.  The patient's wife and the patient both report that he has been doing more activities around the house but it is still difficult for them to connect and I personally with the changes that have happened with the husband passed his cerebrovascular accident.  Treatment Interventions:  Cognitive/behavioral therapeutic intervention adjustment issues as well as working on Pharmacologist and strategies for dealing with residual frontal lobe cognitive deficits  Participation Level:   Active  Participation Quality:  Appropriate and Redirectable      Behavioral Observation:  Well Groomed, Alert, and Appropriate.   Current Psychosocial Factors: The patient is continued to do more around the house than he had been doing previously.  He is helping out with household chores but continues to see it is very difficult and does not have a great deal of self motivation..  Content of Session:    Current symptoms and continue to work on therapeutic interventions.  Effectiveness of Interventions: The patient was more engaging and involved today that he has been in the past.  While it was difficult to get extensive responses from him he did initiate several questions today and this was the first time that  he actively initiated responses versus being asked questions and him given simple one-word answers.  Target Goals:   Target goals include improving coping with his significant loss of function and working on executive functioning's.  Goals Last Reviewed:   11/19/2019  Goals Addressed  Today:    Goals today include working on therapeutic interventions for self initiation of behaviors.  Impression/Diagnosis:   Jeremy [name] is a 44 year old male referred by Dr. Wynn [name] for neuropsychological consultation.  The patient was admitted to the hospital after he presented on 07/27/2006 with right-sided weakness, headache, facial droop and slurred speech.  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  There was also cerebral edema with midline shift visualized.  A decompressive hemicraniotomy was performed.  The patient had significant aphasia/expressive language symptoms and near complete right arm and right leg motor impairments.  There was a great deal of impulse control that was almost completely verbal in nature initially.  The patient was initially verbally resistant and negative interactions during some therapy sessions were noted.  Receptive language functions did appear to be intact.  There was apparent significant depression and anxiety symptoms due to sudden loss of function.  The patient was treated in the comprehensive inpatient rehabilitation program.  During that time I had the opportunity to work with the patient's 3 times that this is a follow-up from that 2019.  The patient was referred for psychotherapeutic/neuropsychological consultation in consulting.  The patient is described as continuing to have significant anxiety and have some significant lack of motivation and desire.  The patient's family deny that it appears to be significant depression but simply no desire internal self initiation.  The patient is described as being completely fine sitting on the sofa and letting his wife do everything.  The patient continues to be rather resistant and denying of some of the level of deficits that he has.  He continues to have significant motor deficits in his right arm but less so in his right leg but also has some paralysis in his right leg.   The patient is described as having significant executive functioning deficits.  The patient was treated through [facility] system starting July 27, 2017 through September 07, 2017.  The patient initially started inpatient rehab while in the hospital and continues with in-home rehab then graduating to outpatient rehab.  The patient is described as sleeping fine and having good appetite.  There are some memory deficits noted primarily as a delay with short-term memory recall.  The patient's wife is his primary caretaker.   The patient is described as having significant self initiation deficits along with paralysis on the right side of his body.  The patient is rather resistant to any efforts to try to get him to engage or do things although he is assisting on going back to work even though he has a significant deficits.  The patient has significant neuropsychological and cognitive deficits that continue.  The patient has significant executive functioning deficits along with his severe motor deficits.  The patient has almost no self initiation of behaviors unless he is very agitated emotionally.  The patient is currently disabled and would not be able to perform his ongoing job duties.     Arley Phenix, Psy.D. Clinical Psychologist Neuropsychologist

## 2020-02-04 ENCOUNTER — Ambulatory Visit (INDEPENDENT_AMBULATORY_CARE_PROVIDER_SITE_OTHER): Payer: Medicare HMO | Admitting: Urology

## 2020-02-04 ENCOUNTER — Encounter: Payer: Self-pay | Admitting: Urology

## 2020-02-04 ENCOUNTER — Other Ambulatory Visit: Payer: Self-pay

## 2020-02-04 VITALS — BP 139/96 | HR 97 | Ht 71.0 in | Wt 215.0 lb

## 2020-02-04 DIAGNOSIS — Z3009 Encounter for other general counseling and advice on contraception: Secondary | ICD-10-CM

## 2020-02-04 DIAGNOSIS — N200 Calculus of kidney: Secondary | ICD-10-CM

## 2020-02-04 MED ORDER — DIAZEPAM 5 MG PO TABS
5.0000 mg | ORAL_TABLET | Freq: Once | ORAL | 0 refills | Status: AC | PRN
Start: 2020-02-04 — End: ?

## 2020-02-04 NOTE — Progress Notes (Signed)
   02/04/2020 3:45 PM   Jeremy [name] May 26, 1976 [id number]  Reason for visit: Discuss vasectomy  HPI: I saw [name] and his wife in urology clinic for evaluation of vasectomy.  He is a 44 year old male with a history of stroke who remains on anticoagulation with Eliquis who I previously took care for kidney stones who is interested in vasectomy.  They have an 42 year old, 17-year-old, and his wife is currently pregnant.  He is interested in vasectomy for permanent sterilization.  On exam, the vas deferens are palpable bilaterally, testicles 20 cc and descended.   We discussed the risks and benefits of vasectomy at length.  Vasectomy is intended to be a permanent form of contraception, and does not produce immediate sterility.  Following vasectomy another form of contraception is required until vas occlusion is confirmed by a post-vasectomy semen analysis obtained 2-3 months after the procedure.  Even after vas occlusion is confirmed, vasectomy is not 100% reliable in preventing pregnancy, and the failure rate is approximately 07/1998.  Repeat vasectomy is required in less than 1% of patients.  He should refrain from ejaculation for 1 week after vasectomy.  Options for fertility after vasectomy include vasectomy reversal, and sperm retrieval with in vitro fertilization or ICSI.  These options are not always successful and may be expensive.  Finally, there are other permanent and non-permanent alternatives to vasectomy available. There is no risk of erectile dysfunction, and the volume of semen will be similar to prior, as the majority of the ejaculate is from the prostate and seminal vesicles.   The procedure takes ~20 minutes.  We recommend patients take 5-10 mg of Valium 30 minutes prior, and he will need a driver post-procedure.  Local anesthetic is injected into the scrotal skin and a small segment of the vas deferens is removed, and the ends occluded. The complication rate is approximately 1-2%, and  includes bleeding, infection, and development of chronic scrotal pain.  PLAN: Will need clearance from PCP/neurology to stop Eliquis, may need Lovenox bridge, would defer to their recommendations Pending insurance approval, will schedule for vasectomy at his convenience   Sondra Come, MD  [facility] [address], [address] [place], [place] [address] [phone]

## 2020-02-04 NOTE — Patient Instructions (Signed)
Pre-Vasectomy Instructions  STOP all aspirin or blood thinners (Aspirin, Plavix, Coumadin, Warfarin, Motrin, Ibuprofen, Advil, Aleve, Naproxen, Naprosyn) for 7 days prior to the procedure.  If you have any questions about stopping these medications please contact your primary care physician or cardiologist.  Shave all hair from the upper scrotum on the day of the procedure.  This means just under the penis onto the scrotal sac.  The area shaved should measure about 2-3 inches around.  You may lather the scrotum with soap and water, and shave with a safety razor.  After shaving the area, thoroughly wash the penis and the scrotum, then shower or bathe to remove all the loose hairs.  If needed, wash the area again just before coming in for your circumcision.  It is recommended to have a light meal an hour or so prior to the procedure.  Bring a scrotal support (jock strap or suspensory, or tight jockey shorts or underwear).  Wear comfortable pants or shorts.  While the actual procedure usually takes about 45 minutes, you should be prepared to stay in the office for approximately one hour.  Bring someone with you to drive you home.  If you have any questions or concerns, please feel free to call the office at [phone].   

## 2020-02-05 ENCOUNTER — Ambulatory Visit: Payer: 59 | Admitting: Physical Medicine & Rehabilitation

## 2020-02-10 NOTE — Telephone Encounter (Signed)
Fax confirmation received. 

## 2020-02-10 NOTE — Telephone Encounter (Signed)
Amy @ [facility] called to inform Sandy,RN that the clearance is for a new procedure

## 2020-02-10 NOTE — Telephone Encounter (Signed)
Received another fax 02-04-20 concerning clearance.  I LMVM for Amy surg coordinator that did fax clearance form 01-06-20 received??  refaxed again.  If needs something different to call or fax.

## 2020-02-10 NOTE — Telephone Encounter (Signed)
Completed and placed in out box.  Thank you. 

## 2020-02-12 ENCOUNTER — Telehealth: Payer: Self-pay

## 2020-02-12 NOTE — Telephone Encounter (Signed)
Incoming medical clearance (see media) to discontinue Eliquis x 3 days prior to vasectomy, then resume immediately after. Pt and wife given this information. Verbal understanding given.

## 2020-03-09 ENCOUNTER — Telehealth: Payer: Self-pay | Admitting: Urology

## 2020-03-09 NOTE — Telephone Encounter (Signed)
Pt wife left vm pt has vasectomy tomorrow and would like a call with instructions

## 2020-03-09 NOTE — Telephone Encounter (Signed)
Called pt's wife per DPR  informed her of pre-vas instructions verbally given and sent via mychart

## 2020-03-10 ENCOUNTER — Ambulatory Visit (INDEPENDENT_AMBULATORY_CARE_PROVIDER_SITE_OTHER): Payer: Medicare HMO | Admitting: Urology

## 2020-03-10 ENCOUNTER — Encounter: Payer: Self-pay | Admitting: Urology

## 2020-03-10 ENCOUNTER — Other Ambulatory Visit: Payer: Self-pay

## 2020-03-10 VITALS — BP 132/83 | HR 98

## 2020-03-10 DIAGNOSIS — Z302 Encounter for sterilization: Secondary | ICD-10-CM

## 2020-03-10 NOTE — Progress Notes (Signed)
VASECTOMY PROCEDURE NOTE:  The patient was taken to the minor procedure room and placed in the supine position. His genitals were prepped and draped in the usual sterile fashion. The right vas deferens was brought up to the skin of the right upper scrotum. The skin overlying it was anesthetized with 1% lidocaine without epinephrine, anesthetic was also injected alongside the vas deferens in the direction of the inguinal canal. The no scalpel vasectomy instrument was used to make a small perforation in the scrotal skin. The vasectomy clamp was used to grasp the vas deferens. It was carefully dissected free from surrounding structures. A 1cm segment of the vas was removed, and the cut ends of the mucosa were cauterized. A figure of eight suture was used to perform fascial interposition. No significant bleeding was noted. The vas deferens was returned to the scrotum. The skin incision was closed with a simple interrupted stitch of 4-0 chromic.  Attention was then turned to the left side. The left vasectomy was performed in the same exact fashion. Sterile dressings were placed over each incision. The patient tolerated the procedure well.  IMPRESSION/DIAGNOSIS: The patient is a 44 year old gentleman who underwent a vasectomy today. Post-procedure instructions were reviewed. I stressed the importance of continuing to use birth control until he provides a semen specimen more than 2 months from now that demonstrates azoospermia.  We discussed return precautions including fever over 101, significant bleeding or hematoma, or uncontrolled pain. I also stressed the importance of avoiding strenuous activity for one week, no sexual activity or ejaculations for 5 days, intermittent icing over the next 48 hours, and scrotal support.   PLAN: The patient will be advised of his semen analysis results when available.  Legrand Rams, MD 03/10/2020

## 2020-03-10 NOTE — Patient Instructions (Signed)
Vasectomy, Care After °This sheet gives you information about how to care for yourself after your procedure. Your health care provider may also give you more specific instructions. If you have problems or questions, contact your health care provider. °What can I expect after the procedure? °After your procedure, it is common to have: °· Mild pain, swelling, redness, or discomfort in your scrotum. °· Some blood coming from your incisions or puncture sites for one or two days. °· Blood in your semen. °Follow these instructions at home: °Medicines ° °· Take over-the-counter and prescription medicines only as told by your health care provider. °· Avoid taking NSAIDs such as aspirin and ibuprofen, because these medicines can make bleeding worse. °Activity °· For the first 2 days after surgery, avoid physical activity and exercise that require a lot of energy. Ask your health care provider what activities are safe for you. °· Do not participate in sports or perform heavy physical labor until your pain has improved, or until your health care provider says it is okay. °· Do not ejaculate for at least 1 week after the procedure, or as long as directed. °· You may resume sexual activity 7-10 days after your procedure, or when your health care provider approves. Use a different method of birth control (contraception) until you have had test results that confirm that there is no sperm in your semen. °Scrotal support °· Use scrotal support, such as a jock strap or underwear with a supportive pouch, as needed for one week after your procedure. °· If you feel discomfort in your scrotum, you may remove the scrotal support to see if the discomfort is relieved. Sometimes scrotal support can press on the scrotum and cause or worsen discomfort. °· If your skin gets irritated, you may add some germ-free (sterile), fluffed bandages or a clean washcloth to the scrotal support. °General instructions °· Put ice on the injured area: °? Put  ice in a plastic bag. °? Place a towel between your skin and the bag. °? Leave the ice on for 20 minutes, 2-3 times a day. °· Check your incisions or puncture sites every day for signs of infection. Check for: °? Redness, swelling, or pain. °? Fluid or blood. °? Warmth. °? Pus or a bad smell. °· Leave stitches (sutures) in place. The sutures will dissolve on their own and do not need to be removed. °· Keep all follow-up visits as told by your health care provider. This is important because you will need a test to confirm that there is no sperm in your semen. Multiple ejaculations are needed to clear out sperm that were beyond the vasectomy site. You will need one test result showing that there is no sperm in your semen before you can resume unprotected sex. This may take 2-4 months after your procedure. °· Do not drive for 24 hours if you were given a sedative to help you relax. °Contact a health care provider if: °· You have redness, swelling, or more pain around your incision or puncture site, or in your scrotum area in general. °· You have bleeding from your incision or puncture site. °· You have pus or a bad smell coming from your incision or puncture site. °· You have a fever. °· Your incision or puncture site opens up. °Get help right away if: °· You develop a rash. °· You have difficulty breathing. °Summary °· After your procedure it is common to have mild pain, swelling, redness, or discomfort in your scrotum. °·   Avoid physical activity and exercise that requires a lot of energy for the first 2 days after surgery. °· Put ice on the injured area. Leave the ice on for 20 minutes, 2-3 times a day. °· Do not drive for 24 hours if you were given a sedative to help you relax. °This information is not intended to replace advice given to you by your health care provider. Make sure you discuss any questions you have with your health care provider. °Document Revised: 06/15/2017 Document Reviewed: 09/29/2016 °Elsevier  Patient Education © 2020 [employer] ° °

## 2020-03-18 ENCOUNTER — Other Ambulatory Visit: Payer: Self-pay | Admitting: Physical Medicine & Rehabilitation

## 2020-03-23 DIAGNOSIS — I10 Essential (primary) hypertension: Secondary | ICD-10-CM | POA: Diagnosis not present

## 2020-03-23 DIAGNOSIS — F329 Major depressive disorder, single episode, unspecified: Secondary | ICD-10-CM | POA: Diagnosis not present

## 2020-03-23 DIAGNOSIS — R21 Rash and other nonspecific skin eruption: Secondary | ICD-10-CM | POA: Diagnosis not present

## 2020-03-23 DIAGNOSIS — E785 Hyperlipidemia, unspecified: Secondary | ICD-10-CM | POA: Diagnosis not present

## 2020-04-22 ENCOUNTER — Encounter: Payer: Medicare HMO | Attending: Physical Medicine & Rehabilitation | Admitting: Psychology

## 2020-04-22 ENCOUNTER — Other Ambulatory Visit: Payer: Self-pay

## 2020-04-22 ENCOUNTER — Encounter: Payer: Self-pay | Admitting: Psychology

## 2020-04-22 DIAGNOSIS — G8110 Spastic hemiplegia affecting unspecified side: Secondary | ICD-10-CM | POA: Diagnosis not present

## 2020-04-22 DIAGNOSIS — R4701 Aphasia: Secondary | ICD-10-CM | POA: Insufficient documentation

## 2020-04-22 DIAGNOSIS — I639 Cerebral infarction, unspecified: Secondary | ICD-10-CM | POA: Diagnosis not present

## 2020-04-22 DIAGNOSIS — F4323 Adjustment disorder with mixed anxiety and depressed mood: Secondary | ICD-10-CM | POA: Diagnosis not present

## 2020-04-22 DIAGNOSIS — I69319 Unspecified symptoms and signs involving cognitive functions following cerebral infarction: Secondary | ICD-10-CM | POA: Insufficient documentation

## 2020-04-22 NOTE — Progress Notes (Signed)
Neuropsychology Visit  Patient:  Jeremy [name]   DOB: 08/14/1975  MR Number: [id number]  Location: [facility] FOR PAIN AND REHABILITATIVE MEDICINE [facility] PHYSICAL MEDICINE AND REHABILITATION [address] [place], [phone] [facility] [place] [address] Dept: [phone]  Date of Service: 04/22/2020   Start: 1 PM End: 2 PM  Duration of Service: 1 Hour today's visit was an in person visit was conducted in my outpatient clinic office.  The patient myself were present for this visit.Marland Kitchen    Provider/Observer:     Hershal Coria PsyD  Chief Complaint:      Chief Complaint  Patient presents with  . Depression  . Agitation  . Memory Loss  . Other    Reason For Service:     Jeremy [name] is a 44 year old male referred by Dr. Wynn Banker for neuropsychological consultation.  The patient was admitted to the hospital after he presented on 07/27/2006 with right-sided weakness, headache, facial droop and slurred speech.  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  There was also cerebral edema with midline shift visualized.  A decompressive hemicraniotomy was performed.  The patient had significant aphasia/expressive language symptoms and near complete right arm and right leg motor impairments.  There was a great deal of impulse control that was almost completely verbal in nature initially.  The patient was initially verbally resistant and negative interactions during some therapy sessions were noted.  Receptive language functions did appear to be intact.  There was apparent significant depression and anxiety symptoms due to sudden loss of function.  The patient was treated in the comprehensive inpatient rehabilitation program.  During that time I had the opportunity to work with the patient's 3 times that this is a follow-up from that 2019.    The above reason for service has been reviewed for this appointment remains applicable for the  current visit.  The patient has made significant improvements in self-directed drive and motivation.  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  The above reason for service has been reviewed for this appointment remains applicable for the current visit.  The patient continues to have significant motivation issues but they are improving overall.  The patient's wife and the patient both report that he has been doing more activities around the house but it is still difficult for them to connect and I personally with the changes that have happened with the husband passed his cerebrovascular accident.  04/22/2020 the patient is continued to progress and improve from his left MCA infarction.  However, he maintains significant motor deficits for the right side of his body and significant reduction in speech fluency.  Receptive language is good and he is able to effectively communicate with lower speech rate or lexicon.  Treatment Interventions:  Cognitive/behavioral therapeutic interventions and adjustment skills as well as working on coping abilities for residual deficits from his CVA.  Participation Level:   Active  Participation Quality:  Appropriate and Redirectable      Behavioral Observation:  Well Groomed, Alert, and Appropriate.   Current Psychosocial Factors: The patient is continuing to be more active around the house and doing domestic type of help for the family.  His wife just recently had their second child and he is having some more responsibilities and helping out with the first child.  Content of  Session:    Current symptoms and continue to work on therapeutic interventions.  Effectiveness of Interventions: The patient was more engaging and involved today that he has been in the past.  While it was difficult to get extensive responses from him he did initiate several questions  today and this was the first time that he actively initiated responses versus being asked questions and him given simple one-word answers.  Target Goals:   Target goals include improving coping with his significant loss of function and working on executive functioning's.  Goals Last Reviewed:   04/22/2020  Goals Addressed Today:    Goals today include working on therapeutic interventions for self initiation of behaviors.  Impression/Diagnosis:   Jeremy [name] is a 44 year old male referred by Dr. Wynn Banker for neuropsychological consultation.  The patient was admitted to the hospital after he presented on 07/27/2006 with right-sided weakness, headache, facial droop and slurred speech.  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  There was also cerebral edema with midline shift visualized.  A decompressive hemicraniotomy was performed.  The patient had significant aphasia/expressive language symptoms and near complete right arm and right leg motor impairments.  There was a great deal of impulse control that was almost completely verbal in nature initially.  The patient was initially verbally resistant and negative interactions during some therapy sessions were noted.  Receptive language functions did appear to be intact.  There was apparent significant depression and anxiety symptoms due to sudden loss of function.  The patient was treated in the comprehensive inpatient rehabilitation program.  During that time I had the opportunity to work with the patient's 3 times that this is a follow-up from that 2019.  The patient was referred for psychotherapeutic/neuropsychological consultation in consulting.  The patient is described as continuing to have significant anxiety and have some significant lack of motivation and desire.  The patient's family deny that it appears to be significant depression but simply no desire internal self initiation.  The patient is described  as being completely fine sitting on the sofa and letting his wife do everything.  The patient continues to be rather resistant and denying of some of the level of deficits that he has.  He continues to have significant motor deficits in his right arm but less so in his right leg but also has some paralysis in his right leg.  The patient is described as having significant executive functioning deficits.  The patient was treated through [facility] system starting July 27, 2017 through September 07, 2017.  The patient initially started inpatient rehab while in the hospital and continues with in-home rehab then graduating to outpatient rehab.  The patient is described as sleeping fine and having good appetite.  There are some memory deficits noted primarily as a delay with short-term memory recall.  The patient's wife is his primary caretaker.   The patient is described as having significant self initiation deficits along with paralysis on the right side of his body.  The patient is rather resistant to any efforts to try to get him to engage or do things although he is assisting on going back to work even though he has a significant deficits.  The patient has significant neuropsychological and cognitive deficits that continue.  The patient has significant executive functioning deficits along with his severe motor deficits.  The patient has almost no self initiation of behaviors unless he is very agitated emotionally.  The patient is  currently disabled and would not be able to perform his ongoing job duties.   04/22/2020 the patient is continued to progress and improve from his left MCA infarction.  However, he maintains significant motor deficits for the right side of his body and significant reduction in speech fluency.  Receptive language is good and he is able to effectively communicate with lower speech rate or lexicon.  Arley Phenix, Psy.D. Clinical Psychologist Neuropsychologist

## 2020-05-25 ENCOUNTER — Encounter: Payer: Medicare HMO | Attending: Physical Medicine & Rehabilitation | Admitting: Physical Medicine & Rehabilitation

## 2020-05-25 ENCOUNTER — Encounter: Payer: Self-pay | Admitting: Physical Medicine & Rehabilitation

## 2020-05-25 ENCOUNTER — Other Ambulatory Visit: Payer: Self-pay

## 2020-05-25 VITALS — BP 131/91 | HR 82 | Temp 98.7°F | Ht 71.0 in | Wt 221.0 lb

## 2020-05-25 DIAGNOSIS — G8110 Spastic hemiplegia affecting unspecified side: Secondary | ICD-10-CM | POA: Insufficient documentation

## 2020-05-25 NOTE — Progress Notes (Signed)
Subjective:    Patient ID: Jeremy [name], male    DOB: 1975/11/21, 44 y.o.   MRN: [id number]  HPI 44 year old male with large left MCA distribution infarct with mass-effect requiring craniotomy in January 2019.  The patient completed inpatient rehabilitation as well as outpatient rehabilitation.  He has had right spastic hemiplegia but has regained the ability to ambulate with a four-point cane as well as a right AFO.  He was last seen approximately 6 months ago.  At that time a double metal rod AFO with T-strap was ordered.  He has subsequently received this and wears it to the clinic today.  He has had no problems with fit.  He is able to don and doff independently.  He wears this most of the day.  He does ambulate without the AFO early in the morning when he gets out of bed. In addition the patient states that his spasticity has not increased in the last 6 months.  He is still able to cut his fingernails with his wife's assistance as well as wash his hand.  No falls at home  Pain Inventory Average Pain 3 Pain Right Now 1 My pain is ?  LOCATION OF PAIN  Shoulder, thigh, leg  BOWEL Number of stools per week: 8  Oral laxative use No  Type of laxative  Enema or suppository use No  History of colostomy No  Incontinent No   BLADDER Normal In and out cath, frequency NA Able to self cath n/a Bladder incontinence No  Frequent urination No  Leakage with coughing No  Difficulty starting stream No  Incomplete bladder emptying No    Mobility walk with assistance use a cane  Function disabled: date disabled .  Neuro/Psych tremor tingling trouble walking spasms  Prior Studies Any changes since last visit?  no  Physicians involved in your care Any changes since last visit?  no   Family History  Problem Relation Age of Onset  . Hypertension Father   . Hypertension Brother    Social History   Socioeconomic History  . Marital status: Married    Spouse name: [name]  .  Number of children: 2  . Years of education: Not on file  . Highest education level: Not on file  Occupational History  . Not on file  Tobacco Use  . Smoking status: Never Smoker  . Smokeless tobacco: Never Used  Vaping Use  . Vaping Use: Never used  Substance and Sexual Activity  . Alcohol use: Not Currently  . Drug use: No  . Sexual activity: Yes    Birth control/protection: Surgical    Comment: Vasectomy 03/10/20  Other Topics Concern  . Not on file  Social History Narrative  . Not on file   Social Determinants of Health   Financial Resource Strain:   . Difficulty of Paying Living Expenses: Not on file  Food Insecurity:   . Worried About Programme researcher, broadcasting/film/video in the Last Year: Not on file  . Ran Out of Food in the Last Year: Not on file  Transportation Needs:   . Lack of Transportation (Medical): Not on file  . Lack of Transportation (Non-Medical): Not on file  Physical Activity:   . Days of Exercise per Week: Not on file  . Minutes of Exercise per Session: Not on file  Stress:   . Feeling of Stress : Not on file  Social Connections:   . Frequency of Communication with Friends and Family: Not on file  .  Frequency of Social Gatherings with Friends and Family: Not on file  . Attends Religious Services: Not on file  . Active Member of Clubs or Organizations: Not on file  . Attends Banker Meetings: Not on file  . Marital Status: Not on file   Past Surgical History:  Procedure Laterality Date  . CRANIOPLASTY N/A 03/19/2018   Procedure: CRANIOPLASTY;  Surgeon: Lisbeth Renshaw, MD;  Location: [facility] OR;  Service: Neurosurgery;  Laterality: N/A;  CRANIOPLASTY  . CRANIOTOMY Left 07/29/2017   Procedure: LEFT HEMI- CRANIECTOMY WITH PLACEMENT OF BONE FLAP IN ABDOMEN;  Surgeon: Lisbeth Renshaw, MD;  Location: [facility] OR;  Service: Neurosurgery;  Laterality: Left;  . CYSTOSCOPY W/ RETROGRADES Left 01/16/2020   Procedure: CYSTOSCOPY WITH RETROGRADE PYELOGRAM;  Surgeon:  Sondra Come, MD;  Location: [facility] ORS;  Service: Urology;  Laterality: Left;  . CYSTOSCOPY/URETEROSCOPY/HOLMIUM LASER/STENT PLACEMENT Left 01/16/2020   Procedure: CYSTOSCOPY/DIAGNOSTIC URETEROSCOPY/STENT PLACEMENT;  Surgeon: Sondra Come, MD;  Location: [facility] ORS;  Service: Urology;  Laterality: Left;  . IR ANGIO INTRA EXTRACRAN SEL COM CAROTID INNOMINATE UNI R MOD SED  07/28/2017  . IR ANGIO VERTEBRAL SEL VERTEBRAL UNI R MOD SED  07/28/2017  . IR CT HEAD LTD  07/28/2017  . IR PERCUTANEOUS ART THROMBECTOMY/INFUSION INTRACRANIAL INC DIAG ANGIO  07/28/2017  . NASAL SEPTOPLASTY W/ TURBINOPLASTY Bilateral 03/06/2017   Procedure: NASAL SEPTOPLASTY WITH TURBINATE REDUCTION;  Surgeon: Linus Salmons, MD;  Location: [facility] ORS;  Service: ENT;  Laterality: Bilateral;  . RADIOLOGY WITH ANESTHESIA N/A 07/27/2017   Procedure: RADIOLOGY WITH ANESTHESIA;  Surgeon: Julieanne Cotton, MD;  Location: [facility] OR;  Service: Radiology;  Laterality: N/A;  . TEE WITHOUT CARDIOVERSION N/A 04/30/2019   Procedure: TRANSESOPHAGEAL ECHOCARDIOGRAM (TEE);  Surgeon: Debbe Odea, MD;  Location: [facility] ORS;  Service: Cardiovascular;  Laterality: N/A;  . VASECTOMY     Past Medical History:  Diagnosis Date  . Deviated septum   . Elevated liver enzymes    SEEN ON LABS FROM CARE EVERYWHERE IN [facility]  . History of kidney stones   . Hypertension   . Sleep apnea    does not use cpap  . Stroke (HCC) 07/2017   RESIUAL EFFECTS OF RIGHT SIDED WEAKNESS (ARM)   BP (!) 131/91   Pulse 82   Temp 98.7 F (37.1 C)   Ht 5\' 11"  (1.803 m)   Wt 221 lb (100.2 kg)   SpO2 98%   BMI 30.82 kg/m   Opioid Risk Score:   Fall Risk Score:  `1  Depression screen PHQ 2/9  Depression screen [facility] 2/9 06/05/2019 09/24/2017  Decreased Interest 0 2  Down, Depressed, Hopeless 0 1  PHQ - 2 Score 0 3  Altered sleeping - 0  Tired, decreased energy - 2  Change in appetite - 2  Feeling bad or failure about yourself  - 1  Trouble  concentrating - 0  Moving slowly or fidgety/restless - 0  Suicidal thoughts - 0  PHQ-9 Score - 8  Difficult doing work/chores - Somewhat difficult  Some recent data might be hidden    Review of Systems  Constitutional: Negative.   HENT: Negative.   Eyes: Negative.   Respiratory: Positive for apnea.   Cardiovascular: Negative.   Gastrointestinal: Negative.   Endocrine: Negative.   Genitourinary: Negative.   Musculoskeletal: Positive for gait problem.       Spasms   Skin: Negative.   Allergic/Immunologic: Negative.   Neurological: Positive for tremors.  Tingling  Psychiatric/Behavioral: Negative.   All other systems reviewed and are negative.      Objective:   Physical Exam Vitals and nursing note reviewed.  Constitutional:      Appearance: He is obese.  HENT:     Head: Normocephalic and atraumatic.  Eyes:     Extraocular Movements: Extraocular movements intact.     Conjunctiva/sclera: Conjunctivae normal.     Pupils: Pupils are equal, round, and reactive to light.  Neurological:     Mental Status: He is alert and oriented to person, place, and time.     Comments: Motor strength is 0/5 right deltoid bicep tricep finger flexors and extensors 5/5 strength in the left deltoid bicep tricep finger flexors and extensors 0/5 in the right ankle dorsiflexor, 2 - in the plantar flexor 4 at the knee extensor 5/5 in the left hip flexor knee extensor ankle dorsiflexor and plantar flexor  Tone Right lower extremity MAS 3 right finger flexors and wrist flexors MAS 3 at the elbow flexor MAS 3 at the right ankle plantar flexor With standing he has foot inversion as well as plantar flexion with mild toe curling.  Left lower extremity tone is normal  Psychiatric:        Attention and Perception: Attention normal.        Mood and Affect: Affect is blunt.        Speech: Speech is delayed.        Behavior: Behavior is slowed.           Assessment & Plan:  #1.  History  of left MCA infarct with right spastic hemiparesis, cognitive deficits, flattened affect He has made very good functional improvement despite limited return of motor function. He follows up with neuropsych for cognition as well as adjustment He would benefit from repeat botulinum toxin injection right upper extremity as well as right phenol tibial nerve block.  At this time the patient would like to wait until his spasticity gets worse before reconsidering I will see him back in 1 year He will call for an appointment sooner if he feels like an injection is needed

## 2020-05-25 NOTE — Patient Instructions (Signed)
Please call if spasticity worsens

## 2020-05-27 ENCOUNTER — Ambulatory Visit: Payer: 59 | Admitting: Psychology

## 2020-06-14 ENCOUNTER — Other Ambulatory Visit: Payer: 59

## 2020-06-15 ENCOUNTER — Encounter: Payer: Self-pay | Admitting: Urology

## 2020-06-24 ENCOUNTER — Ambulatory Visit: Payer: 59 | Admitting: Psychology

## 2020-08-03 DIAGNOSIS — Z20822 Contact with and (suspected) exposure to covid-19: Secondary | ICD-10-CM | POA: Diagnosis not present

## 2020-08-05 ENCOUNTER — Encounter: Payer: Medicare HMO | Admitting: Psychology

## 2020-11-08 ENCOUNTER — Encounter: Payer: Self-pay | Admitting: Psychology

## 2020-11-08 ENCOUNTER — Encounter: Payer: Medicare HMO | Attending: Psychology | Admitting: Psychology

## 2020-11-08 ENCOUNTER — Other Ambulatory Visit: Payer: Self-pay

## 2020-11-08 ENCOUNTER — Encounter: Payer: Medicare HMO | Admitting: Psychology

## 2020-11-08 DIAGNOSIS — R4701 Aphasia: Secondary | ICD-10-CM | POA: Insufficient documentation

## 2020-11-08 DIAGNOSIS — G8110 Spastic hemiplegia affecting unspecified side: Secondary | ICD-10-CM | POA: Diagnosis not present

## 2020-11-08 DIAGNOSIS — I639 Cerebral infarction, unspecified: Secondary | ICD-10-CM | POA: Insufficient documentation

## 2020-11-08 DIAGNOSIS — I69319 Unspecified symptoms and signs involving cognitive functions following cerebral infarction: Secondary | ICD-10-CM | POA: Diagnosis not present

## 2020-11-08 DIAGNOSIS — I69314 Frontal lobe and executive function deficit following cerebral infarction: Secondary | ICD-10-CM | POA: Diagnosis not present

## 2020-11-08 NOTE — Progress Notes (Addendum)
Neuropsychology Visit  Patient:  Jeremy [name]   DOB: [birth year]  MR Number: [id number]  Location: [facility] PHYSICAL MEDICINE AND REHABILITATION [address] [place], [id number] [facility] [place] [address] Dept: [phone]  Date of Service: 11/08/2020   Start: 2 PM End: 3 PM  Duration of Service: 1 Hour Today's visit was a telemedicine visit that was conducted in my outpatient clinic office with the patient myself present.  This was an Microbiologist visit.  TELEHEALTH NOTE  Due to national recommendations of social distancing due to COVID 19, an audio/video telehealth visit is felt to be most appropriate for this patient at this time.  See Chart message from today for the patient's consent to telehealth from [facility].     I verified that I am speaking with the correct person using two identifiers.  Location of patient: At family's home in [place] Location of provider: Office Method of communication: Epic telemedicine visit Names of participants : Jeremy [name], myself obtaining consent and vitals if available Established patient Time spent on call: 1 hour.  After about 40 minutes into the visit there was a loss of connection and we spent the last few minutes with a direct telephone call to the patient's cell phone.    Provider/Observer:     Jeremy Coria PsyD  Chief Complaint:      Chief Complaint  Patient presents with  . Depression  . Agitation  . Memory Loss  . Other    Reason For Service:     [name] is a 45 year old male referred by Dr. Wynn Banker for neuropsychological consultation.  The patient was admitted to the hospital after he presented on 07/27/2006 with right-sided weakness, headache, facial droop and slurred speech.  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  There was also cerebral edema with midline  shift visualized.  A decompressive hemicraniotomy was performed.  The patient had significant aphasia/expressive language symptoms and near complete right arm and right leg motor impairments.  There was a great deal of impulse control that was almost completely verbal in nature initially.  The patient was initially verbally resistant and negative interactions during some therapy sessions were noted.  Receptive language functions did appear to be intact.  There was apparent significant depression and anxiety symptoms due to sudden loss of function.  The patient was treated in the comprehensive inpatient rehabilitation program.  During that time I had the opportunity to work with the patient's 3 times that this is a follow-up from that 2019.    The above reason for service has been reviewed for this appointment remains applicable for the current visit.  The patient has made significant improvements in self-directed drive and motivation.  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  The above reason for service has been reviewed for this appointment remains applicable for the current visit.  The patient continues to have significant motivation issues but they are improving overall.  The patient's wife and the patient both report that he has been doing more activities around the house but it is still difficult for them to connect and I personally with the changes that have happened with the husband passed his cerebrovascular accident.  04/22/2020 the patient is continued to progress and improve from his  left MCA infarction.  However, he maintains significant motor deficits for the right side of his body and significant reduction in speech fluency.  Receptive language is good and he is able to effectively communicate with lower speech rate or lexicon.  11/08/2020: I have not seen the patient for some time.  The  patient is visiting family in [place] and the visit today was conducted via WebEx/epic telemedicine.  The patient reports that he has been having more anger with some aggression towards his wife at times that is described as sporadically.  The patient reports that he has looked up some of his symptoms and feel it may be related to PTSD type symptoms around his cerebrovascular accident and loss of function.  However, I do think that is more likely directly related to his frontal lobe involvement and executive functioning changes.  He did describe more depression and mood changes.  We discussed possibly restarting Lexapro as he had been on it before without significant side effects.  We worked on coping and adjustment strategies etc.  I did contact both his treating physiatrist as well as his PCP Dr. Burnett Sheng to see if one of them felt comfortable writing this prescription.  Treatment Interventions:  Cognitive/behavioral therapeutic interventions and adjustment skills as well as working on coping abilities for residual deficits from his CVA.  Participation Level:   Active  Participation Quality:  Appropriate and Redirectable      Behavioral Observation:  Well Groomed, Alert, and Appropriate.   Current Psychosocial Factors: The patient is continuing to be more active around the house and doing domestic type of help for the family.  His wife just recently had their second child and he is having some more responsibilities and helping out with the first child.  Content of Session:    Current symptoms and continue to work on therapeutic interventions.  Effectiveness of Interventions: The patient was more engaging and involved today that he has been in the past.  While it was difficult to get extensive responses from him he did initiate several questions today and this was the first time that he actively initiated responses versus being asked questions and him given simple one-word answers.  Target  Goals:   Target goals include improving coping with his significant loss of function and working on executive functioning's.  Goals Last Reviewed:   04/22/2020  Goals Addressed Today:    Goals today include working on therapeutic interventions for self initiation of behaviors.  Impression/Diagnosis:   Asaad Gulley is a 45 year old male referred by Dr. Wynn Banker for neuropsychological consultation.  The patient was admitted to the hospital after he presented on 07/27/2006 with right-sided weakness, headache, facial droop and slurred speech.  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  There was also cerebral edema with midline shift visualized.  A decompressive hemicraniotomy was performed.  The patient had significant aphasia/expressive language symptoms and near complete right arm and right leg motor impairments.  There was a great deal of impulse control that was almost completely verbal in nature initially.  The patient was initially verbally resistant and negative interactions during some therapy sessions were noted.  Receptive language functions did appear to be intact.  There was apparent significant depression and anxiety symptoms due to sudden loss of function.  The patient was treated in the comprehensive inpatient rehabilitation program.  During that time I had the opportunity to work with the patient's 3 times that this is a follow-up  from that 2019.  The patient was referred for psychotherapeutic/neuropsychological consultation in consulting.  The patient is described as continuing to have significant anxiety and have some significant lack of motivation and desire.  The patient's family deny that it appears to be significant depression but simply no desire internal self initiation.  The patient is described as being completely fine sitting on the sofa and letting his wife do everything.  The patient continues to be rather resistant and denying of some of  the level of deficits that he has.  He continues to have significant motor deficits in his right arm but less so in his right leg but also has some paralysis in his right leg.  The patient is described as having significant executive functioning deficits.  The patient was treated through [facility] system starting July 27, 2017 through September 07, 2017.  The patient initially started inpatient rehab while in the hospital and continues with in-home rehab then graduating to outpatient rehab.  The patient is described as sleeping fine and having good appetite.  There are some memory deficits noted primarily as a delay with short-term memory recall.  The patient's wife is his primary caretaker.   The patient is described as having significant self initiation deficits along with paralysis on the right side of his body.  The patient is rather resistant to any efforts to try to get him to engage or do things although he is assisting on going back to work even though he has a significant deficits.  The patient has significant neuropsychological and cognitive deficits that continue.  The patient has significant executive functioning deficits along with his severe motor deficits.  The patient has almost no self initiation of behaviors unless he is very agitated emotionally.  The patient is currently disabled and would not be able to perform his ongoing job duties.   04/22/2020 the patient is continued to progress and improve from his left MCA infarction.  However, he maintains significant motor deficits for the right side of his body and significant reduction in speech fluency.  Receptive language is good and he is able to effectively communicate with lower speech rate or lexicon.  11/08/2020: I have not seen the patient for some time.  The patient is visiting family in [place] and the visit today was conducted via WebEx/epic telemedicine.  The patient reports that he has been having more anger with  some aggression towards his wife at times that is described as sporadically.  The patient reports that he has looked up some of his symptoms and feel it may be related to PTSD type symptoms around his cerebrovascular accident and loss of function.  However, I do think that is more likely directly related to his frontal lobe involvement and executive functioning changes.  He did describe more depression and mood changes.  We discussed possibly restarting Lexapro as he had been on it before without significant side effects.  We worked on coping and adjustment strategies etc.  I did contact both his treating physiatrist as well as his PCP Dr. Burnett Sheng to see if one of them felt comfortable writing this prescription.  Arley Phenix, Psy.D. Clinical Psychologist Neuropsychologist

## 2020-11-09 ENCOUNTER — Other Ambulatory Visit: Payer: Self-pay | Admitting: Physical Medicine & Rehabilitation

## 2020-11-09 MED ORDER — ESCITALOPRAM OXALATE 10 MG PO TABS: 10.0000 mg | ORAL_TABLET | Freq: Every day | ORAL | 2 refills | Status: AC

## 2020-11-18 ENCOUNTER — Other Ambulatory Visit: Payer: Self-pay | Admitting: *Deleted

## 2020-11-18 MED ORDER — GABAPENTIN 400 MG PO CAPS: 400.0000 mg | ORAL_CAPSULE | Freq: Three times a day (TID) | ORAL | 1 refills | Status: AC

## 2021-05-26 ENCOUNTER — Encounter: Payer: Medicaid Other | Admitting: Physical Medicine & Rehabilitation

## 2021-06-29 DIAGNOSIS — E782 Mixed hyperlipidemia: Secondary | ICD-10-CM | POA: Diagnosis not present

## 2021-06-29 DIAGNOSIS — R4701 Aphasia: Secondary | ICD-10-CM | POA: Diagnosis not present

## 2021-06-29 DIAGNOSIS — I639 Cerebral infarction, unspecified: Secondary | ICD-10-CM | POA: Diagnosis not present

## 2021-06-29 DIAGNOSIS — I63512 Cerebral infarction due to unspecified occlusion or stenosis of left middle cerebral artery: Secondary | ICD-10-CM | POA: Diagnosis not present

## 2021-06-29 DIAGNOSIS — Z9229 Personal history of other drug therapy: Secondary | ICD-10-CM | POA: Diagnosis not present

## 2021-06-29 DIAGNOSIS — I69314 Frontal lobe and executive function deficit following cerebral infarction: Secondary | ICD-10-CM | POA: Diagnosis not present

## 2021-06-29 DIAGNOSIS — I10 Essential (primary) hypertension: Secondary | ICD-10-CM | POA: Diagnosis not present

## 2021-06-29 DIAGNOSIS — I69351 Hemiplegia and hemiparesis following cerebral infarction affecting right dominant side: Secondary | ICD-10-CM | POA: Diagnosis not present

## 2021-08-11 ENCOUNTER — Other Ambulatory Visit: Payer: Self-pay | Admitting: Physical Medicine & Rehabilitation
